# Patient Record
Sex: Female | Born: 1960 | Race: Black or African American | Hispanic: No | Smoking: Current every day smoker
Health system: Southern US, Community
[De-identification: ages and names within clinical notes are randomized; demographics above are authoritative.]

## PROBLEM LIST (undated history)

## (undated) ENCOUNTER — Emergency Department: Admission: EM | Source: Home / Self Care

## (undated) DIAGNOSIS — M1990 Unspecified osteoarthritis, unspecified site: Secondary | ICD-10-CM

## (undated) DIAGNOSIS — N61 Mastitis without abscess: Secondary | ICD-10-CM

## (undated) DIAGNOSIS — I10 Essential (primary) hypertension: Secondary | ICD-10-CM

## (undated) DIAGNOSIS — Z800 Family history of malignant neoplasm of digestive organs: Secondary | ICD-10-CM

## (undated) DIAGNOSIS — K621 Rectal polyp: Secondary | ICD-10-CM

## (undated) DIAGNOSIS — I2510 Atherosclerotic heart disease of native coronary artery without angina pectoris: Secondary | ICD-10-CM

## (undated) DIAGNOSIS — R140 Abdominal distension (gaseous): Secondary | ICD-10-CM

## (undated) DIAGNOSIS — K635 Polyp of colon: Secondary | ICD-10-CM

## (undated) DIAGNOSIS — C50919 Malignant neoplasm of unspecified site of unspecified female breast: Secondary | ICD-10-CM

## (undated) DIAGNOSIS — R0609 Other forms of dyspnea: Secondary | ICD-10-CM

## (undated) DIAGNOSIS — L732 Hidradenitis suppurativa: Secondary | ICD-10-CM

## (undated) DIAGNOSIS — J449 Chronic obstructive pulmonary disease, unspecified: Secondary | ICD-10-CM

## (undated) DIAGNOSIS — R7989 Other specified abnormal findings of blood chemistry: Secondary | ICD-10-CM

## (undated) DIAGNOSIS — Z923 Personal history of irradiation: Secondary | ICD-10-CM

## (undated) DIAGNOSIS — R0600 Dyspnea, unspecified: Secondary | ICD-10-CM

## (undated) DIAGNOSIS — Z9221 Personal history of antineoplastic chemotherapy: Secondary | ICD-10-CM

## (undated) DIAGNOSIS — M109 Gout, unspecified: Secondary | ICD-10-CM

## (undated) DIAGNOSIS — IMO0001 Reserved for inherently not codable concepts without codable children: Secondary | ICD-10-CM

## (undated) DIAGNOSIS — R5383 Other fatigue: Secondary | ICD-10-CM

## (undated) DIAGNOSIS — Z8051 Family history of malignant neoplasm of kidney: Secondary | ICD-10-CM

## (undated) DIAGNOSIS — R945 Abnormal results of liver function studies: Secondary | ICD-10-CM

## (undated) DIAGNOSIS — E119 Type 2 diabetes mellitus without complications: Secondary | ICD-10-CM

## (undated) DIAGNOSIS — I700 Atherosclerosis of aorta: Secondary | ICD-10-CM

## (undated) DIAGNOSIS — Z803 Family history of malignant neoplasm of breast: Secondary | ICD-10-CM

## (undated) DIAGNOSIS — I739 Peripheral vascular disease, unspecified: Secondary | ICD-10-CM

## (undated) DIAGNOSIS — E785 Hyperlipidemia, unspecified: Secondary | ICD-10-CM

## (undated) DIAGNOSIS — S21002S Unspecified open wound of left breast, sequela: Secondary | ICD-10-CM

## (undated) DIAGNOSIS — K219 Gastro-esophageal reflux disease without esophagitis: Secondary | ICD-10-CM

## (undated) DIAGNOSIS — R7611 Nonspecific reaction to tuberculin skin test without active tuberculosis: Secondary | ICD-10-CM

## (undated) HISTORY — DX: Polyp of colon: K63.5

## (undated) HISTORY — DX: Nonspecific reaction to tuberculin skin test without active tuberculosis: R76.11

## (undated) HISTORY — DX: Hidradenitis suppurativa: L73.2

## (undated) HISTORY — DX: Rectal polyp: K62.1

## (undated) HISTORY — DX: Unspecified osteoarthritis, unspecified site: M19.90

## (undated) HISTORY — PX: TUBAL LIGATION: SHX77

## (undated) HISTORY — DX: Family history of malignant neoplasm of kidney: Z80.51

## (undated) HISTORY — DX: Other fatigue: R53.83

## (undated) HISTORY — DX: Other specified abnormal findings of blood chemistry: R79.89

## (undated) HISTORY — DX: Abdominal distension (gaseous): R14.0

## (undated) HISTORY — DX: Family history of malignant neoplasm of breast: Z80.3

## (undated) HISTORY — DX: Family history of malignant neoplasm of digestive organs: Z80.0

## (undated) HISTORY — DX: Mastitis without abscess: N61.0

## (undated) HISTORY — DX: Malignant neoplasm of unspecified site of unspecified female breast: C50.919

## (undated) HISTORY — DX: Unspecified open wound of left breast, sequela: S21.002S

## (undated) HISTORY — DX: Abnormal results of liver function studies: R94.5

## (undated) HISTORY — DX: Hyperlipidemia, unspecified: E78.5

## (undated) HISTORY — DX: Gout, unspecified: M10.9

## (undated) HISTORY — DX: Essential (primary) hypertension: I10

---

## 2005-08-09 ENCOUNTER — Emergency Department: Payer: Self-pay | Admitting: Emergency Medicine

## 2010-04-27 ENCOUNTER — Ambulatory Visit: Payer: Self-pay

## 2011-12-07 ENCOUNTER — Emergency Department: Payer: Self-pay | Admitting: *Deleted

## 2011-12-07 LAB — URINALYSIS, COMPLETE
Bilirubin,UR: NEGATIVE
Glucose,UR: NEGATIVE mg/dL (ref 0–75)
Ketone: NEGATIVE
Nitrite: NEGATIVE
Ph: 5 (ref 4.5–8.0)
Protein: NEGATIVE
RBC,UR: 7 /HPF (ref 0–5)
Specific Gravity: 1.023 (ref 1.003–1.030)
Squamous Epithelial: 7
WBC UR: 12 /HPF (ref 0–5)

## 2011-12-07 LAB — PREGNANCY, URINE: Pregnancy Test, Urine: NEGATIVE m[IU]/mL

## 2012-05-15 ENCOUNTER — Ambulatory Visit: Payer: Self-pay

## 2012-05-30 HISTORY — PX: ESOPHAGOGASTRODUODENOSCOPY: SHX1529

## 2012-09-13 ENCOUNTER — Ambulatory Visit: Payer: Self-pay

## 2012-09-18 ENCOUNTER — Ambulatory Visit: Payer: Self-pay

## 2012-09-18 LAB — CREATININE, SERUM
Creatinine: 0.94 mg/dL (ref 0.60–1.30)
EGFR (African American): >60
EGFR (Non-African Amer.): >60

## 2012-11-16 DIAGNOSIS — IMO0002 Reserved for concepts with insufficient information to code with codable children: Secondary | ICD-10-CM | POA: Insufficient documentation

## 2012-11-16 DIAGNOSIS — Q661 Congenital talipes calcaneovarus, unspecified foot: Secondary | ICD-10-CM | POA: Insufficient documentation

## 2012-11-16 DIAGNOSIS — E119 Type 2 diabetes mellitus without complications: Secondary | ICD-10-CM | POA: Insufficient documentation

## 2013-08-22 ENCOUNTER — Ambulatory Visit: Payer: Self-pay | Admitting: Podiatrist

## 2013-09-06 ENCOUNTER — Ambulatory Visit: Payer: Self-pay | Admitting: Podiatry

## 2013-09-07 HISTORY — PX: CHOLECYSTECTOMY: SHX55

## 2014-01-09 ENCOUNTER — Emergency Department: Payer: Self-pay | Admitting: Emergency Medicine

## 2014-01-13 DIAGNOSIS — S42253A Displaced fracture of greater tuberosity of unspecified humerus, initial encounter for closed fracture: Secondary | ICD-10-CM | POA: Insufficient documentation

## 2014-05-30 DIAGNOSIS — Z923 Personal history of irradiation: Secondary | ICD-10-CM

## 2014-05-30 DIAGNOSIS — C50912 Malignant neoplasm of unspecified site of left female breast: Secondary | ICD-10-CM | POA: Insufficient documentation

## 2014-05-30 DIAGNOSIS — C50919 Malignant neoplasm of unspecified site of unspecified female breast: Secondary | ICD-10-CM

## 2014-05-30 DIAGNOSIS — Z9221 Personal history of antineoplastic chemotherapy: Secondary | ICD-10-CM

## 2014-05-30 DIAGNOSIS — Z853 Personal history of malignant neoplasm of breast: Secondary | ICD-10-CM

## 2014-05-30 HISTORY — DX: Personal history of irradiation: Z92.3

## 2014-05-30 HISTORY — DX: Personal history of antineoplastic chemotherapy: Z92.21

## 2014-05-30 HISTORY — DX: Malignant neoplasm of unspecified site of unspecified female breast: C50.919

## 2014-05-30 HISTORY — PX: BREAST LUMPECTOMY: SHX2

## 2014-06-02 DIAGNOSIS — R7309 Other abnormal glucose: Secondary | ICD-10-CM | POA: Diagnosis not present

## 2014-06-02 DIAGNOSIS — K439 Ventral hernia without obstruction or gangrene: Secondary | ICD-10-CM | POA: Diagnosis not present

## 2014-06-02 DIAGNOSIS — Z9289 Personal history of other medical treatment: Secondary | ICD-10-CM | POA: Diagnosis not present

## 2014-06-02 DIAGNOSIS — I10 Essential (primary) hypertension: Secondary | ICD-10-CM | POA: Diagnosis not present

## 2014-06-30 DIAGNOSIS — E119 Type 2 diabetes mellitus without complications: Secondary | ICD-10-CM | POA: Insufficient documentation

## 2014-06-30 HISTORY — DX: Type 2 diabetes mellitus without complications: E11.9

## 2014-07-28 ENCOUNTER — Ambulatory Visit: Payer: Self-pay | Admitting: Internal Medicine

## 2014-07-28 DIAGNOSIS — Z1231 Encounter for screening mammogram for malignant neoplasm of breast: Secondary | ICD-10-CM | POA: Diagnosis not present

## 2014-07-29 HISTORY — PX: BREAST LUMPECTOMY: SHX2

## 2014-07-30 ENCOUNTER — Ambulatory Visit: Payer: Self-pay | Admitting: Internal Medicine

## 2014-07-30 DIAGNOSIS — R928 Other abnormal and inconclusive findings on diagnostic imaging of breast: Secondary | ICD-10-CM | POA: Diagnosis not present

## 2014-07-30 DIAGNOSIS — N63 Unspecified lump in breast: Secondary | ICD-10-CM | POA: Diagnosis not present

## 2014-07-31 HISTORY — PX: BREAST BIOPSY: SHX20

## 2014-08-04 ENCOUNTER — Ambulatory Visit: Payer: Self-pay | Admitting: Internal Medicine

## 2014-08-04 DIAGNOSIS — C50812 Malignant neoplasm of overlapping sites of left female breast: Secondary | ICD-10-CM | POA: Diagnosis not present

## 2014-08-04 DIAGNOSIS — N63 Unspecified lump in breast: Secondary | ICD-10-CM | POA: Diagnosis not present

## 2014-08-04 DIAGNOSIS — D0512 Intraductal carcinoma in situ of left breast: Secondary | ICD-10-CM | POA: Diagnosis not present

## 2014-08-04 DIAGNOSIS — C50912 Malignant neoplasm of unspecified site of left female breast: Secondary | ICD-10-CM

## 2014-08-04 HISTORY — DX: Malignant neoplasm of unspecified site of left female breast: C50.912

## 2014-08-11 DIAGNOSIS — C50212 Malignant neoplasm of upper-inner quadrant of left female breast: Secondary | ICD-10-CM | POA: Diagnosis not present

## 2014-08-19 ENCOUNTER — Ambulatory Visit: Payer: Self-pay | Admitting: Surgery

## 2014-08-19 DIAGNOSIS — Z01810 Encounter for preprocedural cardiovascular examination: Secondary | ICD-10-CM | POA: Diagnosis not present

## 2014-08-19 DIAGNOSIS — Z803 Family history of malignant neoplasm of breast: Secondary | ICD-10-CM | POA: Diagnosis not present

## 2014-08-19 DIAGNOSIS — Z01812 Encounter for preprocedural laboratory examination: Secondary | ICD-10-CM | POA: Diagnosis not present

## 2014-08-19 DIAGNOSIS — F17200 Nicotine dependence, unspecified, uncomplicated: Secondary | ICD-10-CM | POA: Diagnosis not present

## 2014-08-19 DIAGNOSIS — R109 Unspecified abdominal pain: Secondary | ICD-10-CM | POA: Diagnosis not present

## 2014-08-19 DIAGNOSIS — R7309 Other abnormal glucose: Secondary | ICD-10-CM | POA: Diagnosis not present

## 2014-08-19 DIAGNOSIS — N63 Unspecified lump in breast: Secondary | ICD-10-CM | POA: Diagnosis not present

## 2014-08-19 DIAGNOSIS — I10 Essential (primary) hypertension: Secondary | ICD-10-CM | POA: Diagnosis not present

## 2014-08-19 DIAGNOSIS — R590 Localized enlarged lymph nodes: Secondary | ICD-10-CM | POA: Diagnosis not present

## 2014-08-27 ENCOUNTER — Ambulatory Visit
Admit: 2014-08-27 | Disposition: A | Discharge status: Home / Self Care | Payer: Self-pay | Attending: Surgery | Admitting: Surgery

## 2014-08-27 DIAGNOSIS — Z8541 Personal history of malignant neoplasm of cervix uteri: Secondary | ICD-10-CM | POA: Diagnosis not present

## 2014-08-27 DIAGNOSIS — I10 Essential (primary) hypertension: Secondary | ICD-10-CM | POA: Diagnosis not present

## 2014-08-27 DIAGNOSIS — Z79899 Other long term (current) drug therapy: Secondary | ICD-10-CM | POA: Diagnosis not present

## 2014-08-27 DIAGNOSIS — Z9889 Other specified postprocedural states: Secondary | ICD-10-CM | POA: Diagnosis not present

## 2014-08-27 DIAGNOSIS — Z887 Allergy status to serum and vaccine status: Secondary | ICD-10-CM | POA: Diagnosis not present

## 2014-08-27 DIAGNOSIS — C50912 Malignant neoplasm of unspecified site of left female breast: Secondary | ICD-10-CM | POA: Diagnosis not present

## 2014-08-27 DIAGNOSIS — C50919 Malignant neoplasm of unspecified site of unspecified female breast: Secondary | ICD-10-CM | POA: Diagnosis not present

## 2014-08-27 DIAGNOSIS — Z170 Estrogen receptor positive status [ER+]: Secondary | ICD-10-CM | POA: Diagnosis not present

## 2014-08-27 DIAGNOSIS — M1990 Unspecified osteoarthritis, unspecified site: Secondary | ICD-10-CM | POA: Diagnosis not present

## 2014-08-27 DIAGNOSIS — E118 Type 2 diabetes mellitus with unspecified complications: Secondary | ICD-10-CM | POA: Diagnosis not present

## 2014-08-27 HISTORY — PX: BREAST LUMPECTOMY WITH NEEDLE LOCALIZATION AND AXILLARY SENTINEL LYMPH NODE BX: SHX5760

## 2014-09-09 ENCOUNTER — Ambulatory Visit
Admit: 2014-09-09 | Disposition: A | Discharge status: Home / Self Care | Payer: Self-pay | Attending: Hematology and Oncology | Admitting: Hematology and Oncology

## 2014-09-09 DIAGNOSIS — Z79899 Other long term (current) drug therapy: Secondary | ICD-10-CM | POA: Diagnosis not present

## 2014-09-09 DIAGNOSIS — M545 Low back pain: Secondary | ICD-10-CM | POA: Diagnosis not present

## 2014-09-09 DIAGNOSIS — Z170 Estrogen receptor positive status [ER+]: Secondary | ICD-10-CM | POA: Diagnosis not present

## 2014-09-09 DIAGNOSIS — I10 Essential (primary) hypertension: Secondary | ICD-10-CM | POA: Diagnosis not present

## 2014-09-09 DIAGNOSIS — Z720 Tobacco use: Secondary | ICD-10-CM | POA: Diagnosis not present

## 2014-09-09 DIAGNOSIS — C50212 Malignant neoplasm of upper-inner quadrant of left female breast: Secondary | ICD-10-CM | POA: Diagnosis not present

## 2014-09-09 DIAGNOSIS — G8929 Other chronic pain: Secondary | ICD-10-CM | POA: Diagnosis not present

## 2014-09-09 DIAGNOSIS — R05 Cough: Secondary | ICD-10-CM | POA: Diagnosis not present

## 2014-09-09 DIAGNOSIS — C773 Secondary and unspecified malignant neoplasm of axilla and upper limb lymph nodes: Secondary | ICD-10-CM | POA: Diagnosis not present

## 2014-09-09 LAB — COMPREHENSIVE METABOLIC PANEL
Albumin: 4.2 g/dL
Alkaline Phosphatase: 96 U/L
Anion Gap: 8 (ref 7–16)
BUN: 11 mg/dL
Bilirubin,Total: 0.6 mg/dL
Calcium, Total: 9.1 mg/dL
Chloride: 105 mmol/L
Co2: 24 mmol/L
Creatinine: 0.67 mg/dL
EGFR (African American): >60
EGFR (Non-African Amer.): >60
Glucose: 113 mg/dL — ABNORMAL HIGH
Potassium: 4.3 mmol/L
SGOT(AST): 24 U/L
SGPT (ALT): 41 U/L
Sodium: 137 mmol/L
Total Protein: 7.9 g/dL

## 2014-09-09 LAB — CBC CANCER CENTER
Basophil #: 0 x10 3/mm (ref 0.0–0.1)
Basophil %: 0.3 %
Eosinophil #: 0.2 x10 3/mm (ref 0.0–0.7)
Eosinophil %: 2.9 %
HCT: 44.5 % (ref 35.0–47.0)
HGB: 14.5 g/dL (ref 12.0–16.0)
Lymphocyte #: 2.7 x10 3/mm (ref 1.0–3.6)
Lymphocyte %: 34.9 %
MCH: 27.1 pg (ref 26.0–34.0)
MCHC: 32.6 g/dL (ref 32.0–36.0)
MCV: 83 fL (ref 80–100)
Monocyte #: 0.4 x10 3/mm (ref 0.2–0.9)
Monocyte %: 5.2 %
Neutrophil #: 4.4 x10 3/mm (ref 1.4–6.5)
Neutrophil %: 56.7 %
Platelet: 227 x10 3/mm (ref 150–440)
RBC: 5.37 10*6/uL — ABNORMAL HIGH (ref 3.80–5.20)
RDW: 13.7 % (ref 11.5–14.5)
WBC: 7.7 x10 3/mm (ref 3.6–11.0)

## 2014-09-09 LAB — HEPATIC FUNCTION PANEL A (ARMC)
Albumin: 4.1 g/dL
Alkaline Phosphatase: 79 U/L
Bilirubin, Direct: <0.1 mg/dL
Bilirubin,Total: 0.1 mg/dL — ABNORMAL LOW
SGOT(AST): 26 U/L
SGPT (ALT): 22 U/L
Total Protein: 7.4 g/dL

## 2014-09-10 LAB — CANCER ANTIGEN 27.29: CA 27.29: 16.9 U/mL (ref 0.0–38.6)

## 2014-09-15 DIAGNOSIS — Z720 Tobacco use: Secondary | ICD-10-CM | POA: Diagnosis not present

## 2014-09-15 DIAGNOSIS — M8589 Other specified disorders of bone density and structure, multiple sites: Secondary | ICD-10-CM | POA: Diagnosis not present

## 2014-09-15 DIAGNOSIS — M545 Low back pain: Secondary | ICD-10-CM | POA: Diagnosis not present

## 2014-09-15 DIAGNOSIS — C50212 Malignant neoplasm of upper-inner quadrant of left female breast: Secondary | ICD-10-CM | POA: Diagnosis not present

## 2014-09-15 DIAGNOSIS — R05 Cough: Secondary | ICD-10-CM | POA: Diagnosis not present

## 2014-09-15 DIAGNOSIS — G8929 Other chronic pain: Secondary | ICD-10-CM | POA: Diagnosis not present

## 2014-09-15 DIAGNOSIS — I10 Essential (primary) hypertension: Secondary | ICD-10-CM | POA: Diagnosis not present

## 2014-09-15 DIAGNOSIS — Z79899 Other long term (current) drug therapy: Secondary | ICD-10-CM | POA: Diagnosis not present

## 2014-09-15 DIAGNOSIS — C773 Secondary and unspecified malignant neoplasm of axilla and upper limb lymph nodes: Secondary | ICD-10-CM | POA: Diagnosis not present

## 2014-09-15 DIAGNOSIS — Z780 Asymptomatic menopausal state: Secondary | ICD-10-CM | POA: Diagnosis not present

## 2014-09-15 DIAGNOSIS — Z170 Estrogen receptor positive status [ER+]: Secondary | ICD-10-CM | POA: Diagnosis not present

## 2014-09-16 DIAGNOSIS — G8929 Other chronic pain: Secondary | ICD-10-CM | POA: Diagnosis not present

## 2014-09-16 DIAGNOSIS — I10 Essential (primary) hypertension: Secondary | ICD-10-CM | POA: Diagnosis not present

## 2014-09-16 DIAGNOSIS — C50212 Malignant neoplasm of upper-inner quadrant of left female breast: Secondary | ICD-10-CM | POA: Diagnosis not present

## 2014-09-16 DIAGNOSIS — M25552 Pain in left hip: Secondary | ICD-10-CM | POA: Diagnosis not present

## 2014-09-16 DIAGNOSIS — R05 Cough: Secondary | ICD-10-CM | POA: Diagnosis not present

## 2014-09-16 DIAGNOSIS — M25512 Pain in left shoulder: Secondary | ICD-10-CM | POA: Diagnosis not present

## 2014-09-16 DIAGNOSIS — Z170 Estrogen receptor positive status [ER+]: Secondary | ICD-10-CM | POA: Diagnosis not present

## 2014-09-16 DIAGNOSIS — Z79899 Other long term (current) drug therapy: Secondary | ICD-10-CM | POA: Diagnosis not present

## 2014-09-16 DIAGNOSIS — Z853 Personal history of malignant neoplasm of breast: Secondary | ICD-10-CM | POA: Diagnosis not present

## 2014-09-16 DIAGNOSIS — M545 Low back pain: Secondary | ICD-10-CM | POA: Diagnosis not present

## 2014-09-16 DIAGNOSIS — C773 Secondary and unspecified malignant neoplasm of axilla and upper limb lymph nodes: Secondary | ICD-10-CM | POA: Diagnosis not present

## 2014-09-16 DIAGNOSIS — Z720 Tobacco use: Secondary | ICD-10-CM | POA: Diagnosis not present

## 2014-09-17 DIAGNOSIS — C50212 Malignant neoplasm of upper-inner quadrant of left female breast: Secondary | ICD-10-CM | POA: Diagnosis not present

## 2014-09-18 DIAGNOSIS — Z170 Estrogen receptor positive status [ER+]: Secondary | ICD-10-CM | POA: Diagnosis not present

## 2014-09-18 DIAGNOSIS — C50212 Malignant neoplasm of upper-inner quadrant of left female breast: Secondary | ICD-10-CM | POA: Diagnosis not present

## 2014-09-18 DIAGNOSIS — C773 Secondary and unspecified malignant neoplasm of axilla and upper limb lymph nodes: Secondary | ICD-10-CM | POA: Diagnosis not present

## 2014-09-22 LAB — SURGICAL PATHOLOGY

## 2014-09-24 ENCOUNTER — Other Ambulatory Visit: Payer: Self-pay | Admitting: Hematology and Oncology

## 2014-09-24 DIAGNOSIS — M545 Low back pain: Secondary | ICD-10-CM | POA: Diagnosis not present

## 2014-09-24 DIAGNOSIS — R05 Cough: Secondary | ICD-10-CM | POA: Diagnosis not present

## 2014-09-24 DIAGNOSIS — Z170 Estrogen receptor positive status [ER+]: Secondary | ICD-10-CM | POA: Diagnosis not present

## 2014-09-24 DIAGNOSIS — C773 Secondary and unspecified malignant neoplasm of axilla and upper limb lymph nodes: Secondary | ICD-10-CM | POA: Diagnosis not present

## 2014-09-24 DIAGNOSIS — R160 Hepatomegaly, not elsewhere classified: Secondary | ICD-10-CM | POA: Diagnosis not present

## 2014-09-24 DIAGNOSIS — C50212 Malignant neoplasm of upper-inner quadrant of left female breast: Secondary | ICD-10-CM | POA: Diagnosis not present

## 2014-09-24 DIAGNOSIS — Z720 Tobacco use: Secondary | ICD-10-CM | POA: Diagnosis not present

## 2014-09-24 DIAGNOSIS — I10 Essential (primary) hypertension: Secondary | ICD-10-CM | POA: Diagnosis not present

## 2014-09-24 DIAGNOSIS — G8929 Other chronic pain: Secondary | ICD-10-CM | POA: Diagnosis not present

## 2014-09-24 DIAGNOSIS — Z79899 Other long term (current) drug therapy: Secondary | ICD-10-CM | POA: Diagnosis not present

## 2014-09-24 LAB — CREATININE, SERUM: Creatine, Serum: 0.67

## 2014-09-27 DIAGNOSIS — Z79899 Other long term (current) drug therapy: Secondary | ICD-10-CM | POA: Diagnosis not present

## 2014-09-27 DIAGNOSIS — I10 Essential (primary) hypertension: Secondary | ICD-10-CM | POA: Diagnosis not present

## 2014-09-27 DIAGNOSIS — Z170 Estrogen receptor positive status [ER+]: Secondary | ICD-10-CM | POA: Diagnosis not present

## 2014-09-27 DIAGNOSIS — Z720 Tobacco use: Secondary | ICD-10-CM | POA: Diagnosis not present

## 2014-09-27 DIAGNOSIS — M47816 Spondylosis without myelopathy or radiculopathy, lumbar region: Secondary | ICD-10-CM | POA: Diagnosis not present

## 2014-09-27 DIAGNOSIS — C50919 Malignant neoplasm of unspecified site of unspecified female breast: Secondary | ICD-10-CM | POA: Diagnosis not present

## 2014-09-27 DIAGNOSIS — C773 Secondary and unspecified malignant neoplasm of axilla and upper limb lymph nodes: Secondary | ICD-10-CM | POA: Diagnosis not present

## 2014-09-27 DIAGNOSIS — G8929 Other chronic pain: Secondary | ICD-10-CM | POA: Diagnosis not present

## 2014-09-27 DIAGNOSIS — R05 Cough: Secondary | ICD-10-CM | POA: Diagnosis not present

## 2014-09-27 DIAGNOSIS — C50212 Malignant neoplasm of upper-inner quadrant of left female breast: Secondary | ICD-10-CM | POA: Diagnosis not present

## 2014-09-27 DIAGNOSIS — M545 Low back pain: Secondary | ICD-10-CM | POA: Diagnosis not present

## 2014-09-28 NOTE — Consult Note (Signed)
Reason for Visit: This 54 year old Female patient presents to the clinic for initial evaluation of  Breast cancer .   Referred by Dr. Mike Gip.  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 Tumor is ER/PR positive HER-2/neu not overexpressed  Pathology Report Pathology report reviewed   Imaging Report Mammograms and ultrasound reviewed   Referral Report Clinical notes reviewed   Planned Treatment Regimen Whole breast radiation plus systemic chemotherapy plus aromatase inhibitor   HPI   Patient's a pleasant 54 year old female who presented with an abnormal mammogram of her left breast showing a 1.5 cm irregular density upper inner quadrant of the left breast. No axillary adenopathy was noted. Underwent ultrasound guided biopsy showing ER/PR positive strongly HER-2/neu negative invasive mammary carcinoma. Underwent wide local excision and sentinel node biopsy on 08/27/2014. Tumor was 0.8 cm overall grade 2 invasive ductal carcinoma with lymphovascular invasion margins were clear. One of 2 sentinel lymph nodes was positive for a 2.8 mm focus of metastatic disease stage NI a. She has done well postoperatively. She's had a bone scan showing abnormal focal radio isotope uptake in L3 pedicle and L5 vertebral body workup that is in progress. She scheduled commenced with systemic chemotherapy under medical oncology's direction she seen today for opinion regarding radiation therapy.  Past Hx:    Right foot turned out:    Prediabetes:    Elevated liver function tests:    + PPD (treated):    Breast cancer: 04-Aug-2014   Hypertension:    Arthritis:    Lumpectomy and sentinel lymph node biopsy: 27-Aug-2014   Breast Biopsy:    Cholecystectomy:    Tubal Ligation:   Past, Family and Social History:  Past Medical History positive    Cardiovascular hypertension   Gastrointestinal Elevated liver liver function tests   Endocrine diabetes mellitus   Past Surgical History cholecystectomy; Tubal ligation   Past Medical History Comments Arthritis   Family History positive   Family History Comments Mother with pancreatic cancer maternal grandmother with breast cancer father with throat cancer   Social History positive   Social History Comments 40-pack-year smoking history smoking cessation counseling has been provided also social EtOH use history   Additional Past Medical and Surgical History Seen by herself today   Allergies:   No Known Allergies:   Home Meds:  Home Medications: Medication Instructions Status  acetaminophen-HYDROcodone 325 mg-5 mg oral tablet 1-2 tab(s) orally every 4 hours, As needed, moderate pain (4-6/10) Active  promethazine 12.5 mg oral tablet 1 tab(s) orally every 3 hours, As needed, nausea, vomiting Active  lisinopril 20 mg oral tablet 1 tab(s) orally once a day Active   Review of Systems:  General negative   Performance Status (ECOG) 0   Skin negative   Breast see HPI   Ophthalmologic negative   ENMT negative   Respiratory and Thorax negative   Cardiovascular negative   Gastrointestinal negative   Genitourinary negative   Musculoskeletal negative   Neurological negative   Psychiatric negative   Hematology/Lymphatics negative   Endocrine negative   Allergic/Immunologic negative   Review of Systems   denies any weight loss, fatigue, weakness, fever, chills or night sweats. Patient denies any loss of vision, blurred vision. Patient denies any ringing  of the ears or hearing loss. No irregular heartbeat. Patient denies heart murmur or history of fainting. Patient  denies any chest pain or pain radiating to her upper extremities. Patient denies any shortness of breath, difficulty breathing at night, cough or hemoptysis. Patient denies any swelling in the lower  legs. Patient denies any nausea vomiting, vomiting of blood, or coffee ground material in the vomitus. Patient denies any stomach pain. Patient states has had normal bowel movements no significant constipation or diarrhea. Patient denies any dysuria, hematuria or significant nocturia. Patient denies any problems walking, swelling in the joints or loss of balance. Patient denies any skin changes, loss of hair or loss of weight. Patient denies any excessive worrying or anxiety or significant depression. Patient denies any problems with insomnia. Patient denies excessive thirst, polyuria, polydipsia. Patient denies any swollen glands, patient denies easy bruising or easy bleeding. Patient denies any recent infections, allergies or URI. Patient "s visual fields have not changed significantly in recent time.   Nursing Notes:  Nursing Vital Signs and Chemo Nursing Nursing Notes: *CC Vital Signs Flowsheet:   20-Apr-16 13:26  Temp Temperature 98.3  Pulse Pulse 80  Respirations Respirations 20  SBP SBP 168  DBP DBP 94  Pain Scale (0-10)  0  Current Weight (kg) (kg) 97.4  Height (cm) centimeters 174  BSA (m2) 2.1   Physical Exam:  General/Skin/HEENT:  General normal   Skin normal   Eyes normal   ENMT normal   Head and Neck normal   Additional PE A well-developed slightly obese female in NAD. Lungs are clear to A&P cardiac examination shows regular rate and rhythm. She status post wide local excision of left breast which is healing well. No dominant mass or nodularity is noted in either breast in 2 positions examined. No axillary or supraclavicular adenopathy is appreciated.   Breasts/Resp/CV/GI/GU:  Respiratory and Thorax normal   Cardiovascular normal   Gastrointestinal normal   Genitourinary normal   MS/Neuro/Psych/Lymph:  Musculoskeletal normal   Neurological normal   Lymphatics normal   Other Results:  Radiology Results: Nuclear Med:    19-Apr-16 14:20, Bone Scan Whole  Body (Part 2 of 2)  Bone Scan Whole Body (Part 2 of 2)   REASON FOR EXAM:    breast CA      pain in lt clavicle rt hip SI joint  COMMENTS:       PROCEDURE: NM  - NM BONE WB 3 HR 2 OF 2  - Sep 16 2014  2:20PM     CLINICAL DATA:  History of breast cancer, left mastectomy March  2016, left clavicular pain, right hip pain, sacroiliac joint pain    EXAM:  NUCLEAR MEDICINE WHOLE BODY BONE SCAN    TECHNIQUE:  Whole body anterior and posterior images were obtained approximately  3 hours after intravenous injection of radiopharmaceutical.  RADIOPHARMACEUTICALS: 23.7 mCi Technetium-99 MDP    COMPARISON:  No similar comparison imaging is available since prior  CT abdomen/ pelvis 09/13/2012    FINDINGS:  Mild soft tissue uptake is noted over the expected location of the  left mastectomy bed, likely postoperative. Mild radiotracer uptake  is identified at the shoulders and ankles bilaterally which may be  degenerative. Probable degenerative sternoclavicular joint uptake is  noted. Physiologic renal uptake is identified. There is focal uptake  in the regionof the right approximate L3 level pedicle and over the  approximate L5 vertebral body level.     IMPRESSION:  Abnormal focal radiotracer uptake at the level of the right L3  pedicle and L5 vertebral body regions. Further evaluation at  dedicated  lumbar spine radiographs is recommended. This could be  degenerative but metastatic disease could appear similar.      Electronically Signed    By: Conchita Paris M.D.    On: 09/16/2014 17:04         Verified By: Arline Asp, M.D.,   Relevent Results:   Relevant Scans and Labs Bone scan reviewed, mammograms reviewed   Assessment and Plan: Impression:   Stage IIa invasive mammary carcinoma of the left breast as was wide local excision and sentinel node biopsy ER/PR positive in 54 year old female for systemic chemotherapy followed by whole breast radiation and aromatase  inhibitor. Plan:   At this time as per my preliminary gone over risks and benefits of radiation therapy to her left breast. Depending on results of workup of her bone scan patient will be going ahead with systemic chemotherapy. Will follow-up after completion of chemotherapy for further discussion regarding whole breast radiation. Risks and benefits of treatment including skin reaction and fatigue inclusion of superficial lung alteration of blood counts thickening of the breast all were discussed in detail with the patient. Will discuss the case personally with medical oncology. Patient will also be candidate for aromatase inhibitor therapy after completion of radiation.  I would like to take this opportunity for allowing me to participate in the care of your patient..    Electronic Signatures: Zarina Pe, Roda Shutters (MD)  (Signed 20-Apr-16 15:12)  Authored: HPI, Diagnosis, Past Hx, PFSH, Allergies, Home Meds, ROS, Nursing Notes, Physical Exam, Other Results, Relevent Results, Encounter Assessment and Plan, Fax to Physician   Last Updated: 20-Apr-16 15:12 by Armstead Peaks (MD)

## 2014-09-28 NOTE — Op Note (Signed)
PATIENT NAME:  Donna Riley, Donna Riley MR#:  [id number] DATE OF BIRTH:  Feb 05, 1961  DATE OF PROCEDURE:  08/27/2014  OPERATION PERFORMED:  1.  Wire localized left medial partial mastectomy.  2.  Left axillary sentinel lymph node biopsy.   SURGEON: Consuela Mimes, MD.   ANESTHESIA: General.   PREOPERATIVE DIAGNOSIS: Left medial breast cancer.   POSTOPERATIVE DIAGNOSIS: Left medial breast cancer.   PROCEDURE IN DETAIL: The patient was injected with radionuclide a number of hours prior to beginning of the procedure. The wire localization films were reviewed. The patient's subareolar area and area just lateral to the medial breast lesion was injected with approximately 8 mL of methylene blue dye and the breast was massaged for 5 minutes. She was then prepped and draped in the usual sterile fashion. The gamma probe was used and at the primary injection site the counts averaged 17,000. There was an area of skin over the axillary sentinel lymph node that averaged 300. An incision was made in the lines of the skin over this spot and 2 blue sentinel lymph nodes were identified and excised separately. The first had an ex vivo count of 460 and the second had an ex vivo count of 1200. After excising both of these the background counts were a maximum of 50 in the axillary wound. Hemostasis was achieved with a combination of medium hemoclips and the electrocautery and medium hemoclips were placed on all afferent and efferent lymphatics that could be identified. Both of these sentinel lymph nodes returned on frozen section as negative for cancer. Therefore the subdermal portion of the incision was closed with interrupted 3-0 Monocryl and the skin was closed with a running subcuticular 4-0 Monocryl and suture strips. While I was waiting for the pathology I began the lumpectomy. This was performed through a transverse incision as the wire and the mass were both directly medial to the nipple. Once the skin and subcutaneous  tissue were opened the wire was brought into the wound and a generous partial mastectomy was undertaken to include the entire wire, clip, mass, and a small amount of pectoralis major muscle fascia. The specimen was oriented for the pathologist, placed in a Dubin container and x-rayed and indeed contained the tip of the wire, the clip, and the radiographic mass. Hemostasis here was also achieved with a combination of the electrocautery and medium hemoclips and medium hemoclips were placed at the periphery of the lumpectomy or partial mastectomy site for future reference for radiation therapy. This incision was closed identically to the axillary incision and then fluffs were placed on the breast and the entire chest was wrapped with Kerlix gauze and two 6-inch Ace wraps, completing the procedure. The patient tolerated the procedure well and there were no complications.    ____________________________ Consuela Mimes, MD wfm:[id number] D: 08/27/2014 14:41:26 ET T: 08/27/2014 19:55:53 ET JOB#: [id number]  cc: Consuela Mimes, MD, <Dictator> Consuela Mimes MD ELECTRONICALLY SIGNED 08/28/2014 19:01

## 2014-09-29 ENCOUNTER — Inpatient Hospital Stay: Payer: Commercial Managed Care - HMO | Attending: Hematology and Oncology

## 2014-09-29 ENCOUNTER — Inpatient Hospital Stay (HOSPITAL_BASED_OUTPATIENT_CLINIC_OR_DEPARTMENT_OTHER): Payer: Commercial Managed Care - HMO | Admitting: Hematology and Oncology

## 2014-09-29 VITALS — BP 162/89 | HR 80 | Resp 20 | Ht 68.0 in | Wt 219.4 lb

## 2014-09-29 DIAGNOSIS — C50912 Malignant neoplasm of unspecified site of left female breast: Secondary | ICD-10-CM

## 2014-09-29 DIAGNOSIS — R7989 Other specified abnormal findings of blood chemistry: Secondary | ICD-10-CM | POA: Diagnosis not present

## 2014-09-29 DIAGNOSIS — R160 Hepatomegaly, not elsewhere classified: Secondary | ICD-10-CM | POA: Diagnosis not present

## 2014-09-29 DIAGNOSIS — M898X9 Other specified disorders of bone, unspecified site: Secondary | ICD-10-CM | POA: Insufficient documentation

## 2014-09-29 DIAGNOSIS — I10 Essential (primary) hypertension: Secondary | ICD-10-CM | POA: Insufficient documentation

## 2014-09-29 DIAGNOSIS — Z5111 Encounter for antineoplastic chemotherapy: Secondary | ICD-10-CM | POA: Diagnosis not present

## 2014-09-29 DIAGNOSIS — M8580 Other specified disorders of bone density and structure, unspecified site: Secondary | ICD-10-CM

## 2014-09-29 DIAGNOSIS — Z170 Estrogen receptor positive status [ER+]: Secondary | ICD-10-CM | POA: Diagnosis not present

## 2014-09-29 DIAGNOSIS — Z5189 Encounter for other specified aftercare: Secondary | ICD-10-CM

## 2014-09-29 DIAGNOSIS — Z79899 Other long term (current) drug therapy: Secondary | ICD-10-CM

## 2014-09-29 DIAGNOSIS — L732 Hidradenitis suppurativa: Secondary | ICD-10-CM | POA: Diagnosis not present

## 2014-09-29 LAB — CBC WITH DIFFERENTIAL/PLATELET
Basophils Absolute: 0 10*3/uL (ref 0–0.1)
Basophils Relative: 0 %
Eosinophils Absolute: 0 10*3/uL (ref 0–0.7)
Eosinophils Relative: 0 %
HCT: 41 % (ref 35.0–47.0)
Hemoglobin: 13.4 g/dL (ref 12.0–16.0)
Lymphocytes Relative: 9 %
Lymphs Abs: 1.4 10*3/uL (ref 1.0–3.6)
MCH: 27 pg (ref 26.0–34.0)
MCHC: 32.8 g/dL (ref 32.0–36.0)
MCV: 82.4 fL (ref 80.0–100.0)
Monocytes Absolute: 0.2 10*3/uL (ref 0.2–0.9)
Monocytes Relative: 2 %
Neutro Abs: 13.7 10*3/uL — ABNORMAL HIGH (ref 1.4–6.5)
Neutrophils Relative %: 89 %
Platelets: 210 10*3/uL (ref 150–440)
RBC: 4.97 MIL/uL (ref 3.80–5.20)
RDW: 13.4 % (ref 11.5–14.5)
WBC: 15.3 10*3/uL — ABNORMAL HIGH (ref 3.6–11.0)

## 2014-09-29 LAB — COMPREHENSIVE METABOLIC PANEL
ALT: 22 U/L (ref 14–54)
AST: 27 U/L (ref 15–41)
Albumin: 4 g/dL (ref 3.5–5.0)
Alkaline Phosphatase: 81 U/L (ref 38–126)
Anion gap: 10 (ref 5–15)
BUN: 12 mg/dL (ref 6–20)
CO2: 18 mmol/L — ABNORMAL LOW (ref 22–32)
Calcium: 9.4 mg/dL (ref 8.9–10.3)
Chloride: 108 mmol/L (ref 101–111)
Creatinine, Ser: 0.9 mg/dL (ref 0.44–1.00)
GFR calc Af Amer: >60 mL/min (ref >60)
GFR calc non Af Amer: >60 mL/min (ref >60)
Glucose, Bld: 275 mg/dL — ABNORMAL HIGH (ref 65–99)
Potassium: 3.7 mmol/L (ref 3.5–5.1)
Sodium: 136 mmol/L (ref 135–145)
Total Bilirubin: 0.4 mg/dL (ref 0.3–1.2)
Total Protein: 7.9 g/dL (ref 6.5–8.1)

## 2014-09-29 LAB — MAGNESIUM: Magnesium: 2 mg/dL (ref 1.7–2.4)

## 2014-09-29 MED ORDER — ONDANSETRON HCL 8 MG PO TABS: 8.0000 mg | ORAL_TABLET | Freq: Three times a day (TID) | ORAL | Status: DC | PRN

## 2014-09-29 NOTE — Progress Notes (Signed)
Pt here today for follow up regarding breast cancer and results today and treatment; c/o left arm pain 8/10

## 2014-09-30 ENCOUNTER — Inpatient Hospital Stay: Payer: Commercial Managed Care - HMO

## 2014-09-30 VITALS — BP 156/77 | HR 56 | Temp 97.1°F | Resp 20

## 2014-09-30 DIAGNOSIS — Z170 Estrogen receptor positive status [ER+]: Secondary | ICD-10-CM | POA: Diagnosis not present

## 2014-09-30 DIAGNOSIS — M8580 Other specified disorders of bone density and structure, unspecified site: Secondary | ICD-10-CM | POA: Diagnosis not present

## 2014-09-30 DIAGNOSIS — Z5111 Encounter for antineoplastic chemotherapy: Secondary | ICD-10-CM | POA: Diagnosis not present

## 2014-09-30 DIAGNOSIS — Z5189 Encounter for other specified aftercare: Secondary | ICD-10-CM | POA: Diagnosis not present

## 2014-09-30 DIAGNOSIS — C50912 Malignant neoplasm of unspecified site of left female breast: Secondary | ICD-10-CM

## 2014-09-30 DIAGNOSIS — R160 Hepatomegaly, not elsewhere classified: Secondary | ICD-10-CM | POA: Diagnosis not present

## 2014-09-30 DIAGNOSIS — R7989 Other specified abnormal findings of blood chemistry: Secondary | ICD-10-CM | POA: Diagnosis not present

## 2014-09-30 DIAGNOSIS — I10 Essential (primary) hypertension: Secondary | ICD-10-CM | POA: Diagnosis not present

## 2014-09-30 DIAGNOSIS — Z79899 Other long term (current) drug therapy: Secondary | ICD-10-CM | POA: Diagnosis not present

## 2014-09-30 MED ORDER — SODIUM CHLORIDE 0.9 % IJ SOLN
10.0000 mL | INTRAMUSCULAR | Status: DC | PRN
  Filled 2014-09-30: qty 10

## 2014-09-30 MED ORDER — FAMOTIDINE IN NACL 20-0.9 MG/50ML-% IV SOLN
20.0000 mg | Freq: Once | INTRAVENOUS | Status: AC | PRN
Start: 2014-09-30 — End: 2014-09-30
  Administered 2014-09-30: 20 mg via INTRAVENOUS
  Filled 2014-09-30: qty 50

## 2014-09-30 MED ORDER — SODIUM CHLORIDE 0.9 % IV SOLN
Freq: Once | INTRAVENOUS | Status: AC
  Administered 2014-09-30: 10:00:00 via INTRAVENOUS
  Filled 2014-09-30: qty 250

## 2014-09-30 MED ORDER — PALONOSETRON HCL INJECTION 0.25 MG/5ML
0.2500 mg | Freq: Once | INTRAVENOUS | Status: AC
  Administered 2014-09-30: 0.25 mg via INTRAVENOUS
  Filled 2014-09-30: qty 5

## 2014-09-30 MED ORDER — DIPHENHYDRAMINE HCL 50 MG/ML IJ SOLN
25.0000 mg | Freq: Once | INTRAMUSCULAR | Status: AC | PRN
  Administered 2014-09-30: 25 mg via INTRAVENOUS
  Filled 2014-09-30: qty 1

## 2014-09-30 MED ORDER — DOCETAXEL CHEMO INJECTION 160 MG/16ML
75.0000 mg/m2 | Freq: Once | INTRAVENOUS | Status: AC
  Administered 2014-09-30: 160 mg via INTRAVENOUS
  Filled 2014-09-30: qty 16

## 2014-09-30 MED ORDER — SODIUM CHLORIDE 0.9 % IV SOLN
Freq: Once | INTRAVENOUS | Status: AC
  Administered 2014-09-30: 10:00:00 via INTRAVENOUS
  Filled 2014-09-30: qty 5

## 2014-09-30 MED ORDER — SODIUM CHLORIDE 0.9 % IV SOLN
600.0000 mg/m2 | Freq: Once | INTRAVENOUS | Status: AC
  Administered 2014-09-30: 1300 mg via INTRAVENOUS
  Filled 2014-09-30: qty 40

## 2014-09-30 MED ORDER — HEPARIN SOD (PORK) LOCK FLUSH 100 UNIT/ML IV SOLN
500.0000 [IU] | Freq: Once | INTRAVENOUS | Status: DC | PRN
  Filled 2014-09-30: qty 5

## 2014-09-30 MED ORDER — SODIUM CHLORIDE 0.9 % IV SOLN: Freq: Once | INTRAVENOUS | Status: DC

## 2014-09-30 NOTE — Progress Notes (Signed)
[facility] day:  09/30/2014  Chief Complaint: Donna Riley is an 54 y.o. female with stage IIA left breast cancer who is seen for assessment prior to cycle #1 Taxotere and Cytoxan.  HPI:  The patient was last seen in the medical oncology clinic on 09/18/2014.  At that time, decision was made to pursue additional imaging given her symptomatology.  She noted right upper quadrant pain and low back pain.  Prior bone scan on 09/16/2014 revealed abnormal focal uptake at the level of right L3 pedicle and L5 vertebral body.  She went to chemotherapy class.  Abdominal and pelvic CT scan on 09/24/2014 revealed hepatomegaly and no evidence of metastatic disease.  There was possible constipation.  Lumbar spine MRI on 09/27/2014 revealed no evidence of metastatic disease with lower lumbar facet arthritis.  Symptomatically, she voices no new complaint.  She took her steroids last night.  She describes hot flashes last night.  She has left arm aches "like a tooth ache".  Past Medical History  Diagnosis Date  . Prediabetes   . Elevated LFTs   . Positive PPD, treated   . Hypertension   . Arthritis   . Breast cancer     left    Past Surgical History  Procedure Laterality Date  . Cholecystectomy    . Tubal ligation Bilateral   . Breast lumpectomy with needle localization and axillary sentinel lymph node bx Left   . Breast biopsy Left     Family History  Problem Relation Age of Onset  . Cancer Mother     Pancreatic  . Cancer Father     Throat  . Cancer Maternal Grandmother     Breast    Social History:  reports that she has been smoking.  She does not have any smokeless tobacco history on file. She reports that she drinks alcohol. Her drug history is not on file.  Allergies: No Known Allergies  Current Outpatient Prescriptions  Medication Sig Dispense Refill  . dexamethasone (DECADRON) 4 MG tablet Take 4 mg by mouth 2 (two) times daily with  a meal.    . HYDROcodone-acetaminophen (NORCO/VICODIN) 5-325 MG per tablet Take 1-2 tablets by mouth every 4 (four) hours as needed for moderate pain.    Marland Kitchen lisinopril (PRINIVIL,ZESTRIL) 20 MG tablet Take 20 mg by mouth daily.    . ondansetron (ZOFRAN) 8 MG tablet Take 1 tablet (8 mg total) by mouth every 8 (eight) hours as needed for nausea or vomiting. 20 tablet 0   No current facility-administered medications for this visit.   Facility-Administered Medications Ordered in Other Visits  Medication Dose Route Frequency Provider Last Rate Last Dose  . heparin lock flush 100 unit/mL  500 Units Intracatheter Once PRN Lequita Asal, MD      . sodium chloride 0.9 % injection 10 mL  10 mL Intracatheter PRN Lequita Asal, MD        ROS  GENERAL:  Feels "OK".  No fevers, sweats or weight loss. PERFORMANCE STATUS (ECOG):  1 HEENT:  No visual changes, runny nose, sore throat, mouth sores or tenderness. Lungs: No shortness of breath or cough.  No hemoptysis. Cardiac:  No chest pain, palpitations, orthopnea, or PND. GI:  No nausea, vomiting, diarrhea, constipation, melena or hematochezia. GU:  No urgency, frequency, dysuria, or hematuria. Musculoskeletal:  Low back pain (stable).  No joint pain.  No muscle tenderness. Extremities:  Left arm ache "like a tooth ache".  Skin:  No rashes or skin changes. Neuro:  No headache, numbness or weakness, balance or coordination issues. Endocrine:  Hot flashes.  Pre diabetes. Psych:  No mood changes, depression or anxiety. Pain:  No focal pain. Review of systems:  All other systems reviewed and found to be negative.  Physical Exam  Blood pressure 162/89, pulse 80, resp. rate 20, height 5' 8" (1.727 m), weight 219 lb 5.7 oz (99.499 kg).  GENERAL:  Well developed, well nourished woman sitting comfortably in the exam room in no acute distress. MENTAL STATUS:  Alert and oriented to person, place and time. HEAD:  Brown hair.  Normocephalic, atraumatic,  face symmetric, no Cushingoid features. EYES:  Brown eyes.  Pupils equal round and reactive to light and accomodation.  No conjunctivitis or scleral icterus. ENT:  Oropharynx clear without lesion.  Tongue normal. Mucous membranes moist.  RESPIRATORY:  Clear to auscultation without rales, wheezes or rhonchi. CARDIOVASCULAR:  Regular rate and rhythm without murmur, rub or gallop. ABDOMEN:  Soft, non-tender, with active bowel sounds, and no hepatosplenomegaly.  No masses. SKIN:  No rashes, ulcers or lesions. EXTREMITIES: No edema, no skin discoloration or tenderness.  No palpable cords. LYMPH NODES: No palpable cervical, supraclavicular, axillary or inguinal adenopathy  NEUROLOGICAL: Unremarkable. PSYCH:  Appropriate.   Clinical Support on 09/29/2014  Component Date Value Ref Range Status  . WBC 09/29/2014 15.3* 3.6 - 11.0 K/uL Final  . RBC 09/29/2014 4.97  3.80 - 5.20 MIL/uL Final  . Hemoglobin 09/29/2014 13.4  12.0 - 16.0 g/dL Final  . HCT 09/29/2014 41.0  35.0 - 47.0 % Final  . MCV 09/29/2014 82.4  80.0 - 100.0 fL Final  . MCH 09/29/2014 27.0  26.0 - 34.0 pg Final  . MCHC 09/29/2014 32.8  32.0 - 36.0 g/dL Final  . RDW 09/29/2014 13.4  11.5 - 14.5 % Final  . Platelets 09/29/2014 210  150 - 440 K/uL Final  . Neutrophils Relative % 09/29/2014 89%   Final  . Neutro Abs 09/29/2014 13.7* 1.4 - 6.5 K/uL Final  . Lymphocytes Relative 09/29/2014 9%   Final  . Lymphs Abs 09/29/2014 1.4  1.0 - 3.6 K/uL Final  . Monocytes Relative 09/29/2014 2%   Final  . Monocytes Absolute 09/29/2014 0.2  0.2 - 0.9 K/uL Final  . Eosinophils Relative 09/29/2014 0%   Final  . Eosinophils Absolute 09/29/2014 0.0  0 - 0.7 K/uL Final  . Basophils Relative 09/29/2014 0%   Final  . Basophils Absolute 09/29/2014 0.0  0 - 0.1 K/uL Final  . Sodium 09/29/2014 136  135 - 145 mmol/L Final  . Potassium 09/29/2014 3.7  3.5 - 5.1 mmol/L Final  . Chloride 09/29/2014 108  101 - 111 mmol/L Final  . CO2 09/29/2014 18* 22 - 32  mmol/L Final  . Glucose, Bld 09/29/2014 275* 65 - 99 mg/dL Final  . BUN 09/29/2014 12  6 - 20 mg/dL Final  . Creatinine, Ser 09/29/2014 0.90  0.44 - 1.00 mg/dL Final  . Calcium 09/29/2014 9.4  8.9 - 10.3 mg/dL Final  . Total Protein 09/29/2014 7.9  6.5 - 8.1 g/dL Final  . Albumin 09/29/2014 4.0  3.5 - 5.0 g/dL Final  . AST 09/29/2014 27  15 - 41 U/L Final  . ALT 09/29/2014 22  14 - 54 U/L Final  . Alkaline Phosphatase 09/29/2014 81  38 - 126 U/L Final  . Total Bilirubin 09/29/2014 0.4  0.3 - 1.2 mg/dL Final  . GFR calc non Af  Amer 09/29/2014 >60  >60 mL/min Final  . GFR calc Af Amer 09/29/2014 >60  >60 mL/min Final   Comment: (NOTE) The eGFR has been calculated using the CKD EPI equation. This calculation has not been validated in all clinical situations. eGFR's persistently <90 mL/min signify possible Chronic Kidney Disease.   . Anion gap 09/29/2014 10  5 - 15 Final  . Magnesium 09/29/2014 2.0  1.7 - 2.4 mg/dL Final    Assessment:  The patient is a 54 year old [place] woman with stage IIA left breast cancer status post partial mastectomy and sentinel lymph node biopsy on 08/27/2014.  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  There was lymphovascular invasion.  One of 2 sentinel lymph nodes were positive  (focus of 2.8 mm).  Tumor was > 90% ER positive, > 90% PR positive, and Her2/neu negative.  Pathologic stage was [id number].  Bone scan on 09/16/2014 revealed  abnormal focal uptake at the level of right L3 pedicle and L5 vertebral body.  Lumbar spine MRI on 09/27/2014 revealed no evidence of metastatic disease with lower lumbar facet arthritis.  Abdominal and pelvic CT scan on 09/24/2014 revealed hepatomegaly and no evidence of metastatic disease.    Bone density study on 09/15/2014 revealed a T-score of -2.1 at L1-L4 (osteopenia).  Symptomatically, she voices no new complaint.  She took her steroids last  night.  She had hot flashes last night.  Her left arm aches "like a tooth ache".  She has chronic RUQ pain.  Plan: 1)  Review results from bone density study, abdominal CT scan, and lumbar spine MRI. 2)  Discuss treatment plan with 4 cycles of Taxotere and Cytoxan (TC) with Neulasta support every 21 days.  Side effects of chemotherapy re-reviewed.  Discussed steroids on the day before and day after chemotherapy (steroids will be given in infusion center on the day of chemotherapy).  Discussed Neulasta 24 hours after chemotherapy.  Discussed side effects and use of Claritin to prevent Neulasta induced bone pain.  Discussed nadir counts in 10 days.  Patient consented to treatment. 3)  Labs today:  CBC with diff, CMP, Mg. 4)  Cycle #1 TC today. 5)  RTC tomorrow for Neulasta. 6)  RTC in 10 days for CBC with diff. 7)  RTC in 3 weeks for MD assessment, labs (CBC with diff, CMP, Mg) and cycle #2 TC.  Addendum:  The patient will begin cycle #1 tomorrow as inadvertantly she was not on the infusion schedule today with the switch to Epic today.  Neulasta will be given 24 hours later.  She was contacted regarding her elevated blood sugar.  Her PCP will also be notified given her diagnosis of pre-diabetes and need for blood sugar monitoring and potential need for SSI.  Lequita Asal, MD  09/30/2014, 1:53 PM

## 2014-09-30 NOTE — Progress Notes (Signed)
Pt notified on elevated blood sugar per Dr. Mike Gip

## 2014-09-30 NOTE — Patient Instructions (Addendum)
[facility] Discharge Instructions for Patients Receiving Chemotherapy  Today you received the following chemotherapy agents Taxotere and Cytoxan.  To help prevent nausea and vomiting after your treatment, we encourage you to take your nausea medications as directed.   If you develop nausea and vomiting that is not controlled by your nausea medication, call the clinic.   BELOW ARE SYMPTOMS THAT SHOULD BE REPORTED IMMEDIATELY:  *FEVER GREATER THAN 100.5 F  *CHILLS WITH OR WITHOUT FEVER  NAUSEA AND VOMITING THAT IS NOT CONTROLLED WITH YOUR NAUSEA MEDICATION  *UNUSUAL SHORTNESS OF BREATH  *UNUSUAL BRUISING OR BLEEDING  TENDERNESS IN MOUTH AND THROAT WITH OR WITHOUT PRESENCE OF ULCERS  *URINARY PROBLEMS  *BOWEL PROBLEMS  UNUSUAL RASH Items with * indicate a potential emergency and should be followed up as soon as possible.  Feel free to call the clinic you have any questions or concerns. The clinic phone number is [phone].  Please show the [facility] at check-in to the Emergency Department and triage nurse.  Cyclophosphamide injection What is this medicine? CYCLOPHOSPHAMIDE (sye kloe FOSS fa mide) is a chemotherapy drug. It slows the growth of cancer cells. This medicine is used to treat many types of cancer like lymphoma, myeloma, leukemia, breast cancer, and ovarian cancer, to name a few. This medicine may be used for other purposes; ask your health care provider or pharmacist if you have questions. COMMON BRAND NAME(S): Cytoxan, Neosar What should I tell my health care provider before I take this medicine? They need to know if you have any of these conditions: -blood disorders -history of other chemotherapy -infection -kidney disease -liver disease -recent or ongoing radiation therapy -tumors in the bone marrow -an unusual or allergic reaction to cyclophosphamide, other chemotherapy, other medicines, foods, dyes, or  preservatives -pregnant or trying to get pregnant -breast-feeding How should I use this medicine? This drug is usually given as an injection into a vein or muscle or by infusion into a vein. It is administered in a hospital or clinic by a specially trained health care professional. Talk to your pediatrician regarding the use of this medicine in children. Special care may be needed. Overdosage: If you think you have taken too much of this medicine contact a poison control center or emergency room at once. NOTE: This medicine is only for you. Do not share this medicine with others. What if I miss a dose? It is important not to miss your dose. Call your doctor or health care professional if you are unable to keep an appointment. What may interact with this medicine? This medicine may interact with the following medications: -amiodarone -amphotericin B -azathioprine -certain antiviral medicines for HIV or AIDS such as protease inhibitors (e.g., indinavir, ritonavir) and zidovudine -certain blood pressure medications such as benazepril, captopril, enalapril, fosinopril, lisinopril, moexipril, monopril, perindopril, quinapril, ramipril, trandolapril -certain cancer medications such as anthracyclines (e.g., daunorubicin, doxorubicin), busulfan, cytarabine, paclitaxel, pentostatin, tamoxifen, trastuzumab -certain diuretics such as chlorothiazide, chlorthalidone, hydrochlorothiazide, indapamide, metolazone -certain medicines that treat or prevent blood clots like warfarin -certain muscle relaxants such as succinylcholine -cyclosporine -etanercept -indomethacin -medicines to increase blood counts like filgrastim, pegfilgrastim, sargramostim -medicines used as general anesthesia -metronidazole -natalizumab This list may not describe all possible interactions. Give your health care provider a list of all the medicines, herbs, non-prescription drugs, or dietary supplements you use. Also tell them if  you smoke, drink alcohol, or use illegal drugs. Some items may interact with your medicine. What should I  watch for while using this medicine? Visit your doctor for checks on your progress. This drug may make you feel generally unwell. This is not uncommon, as chemotherapy can affect healthy cells as well as cancer cells. Report any side effects. Continue your course of treatment even though you feel ill unless your doctor tells you to stop. Drink water or other fluids as directed. Urinate often, even at night. In some cases, you may be given additional medicines to help with side effects. Follow all directions for their use. Call your doctor or health care professional for advice if you get a fever, chills or sore throat, or other symptoms of a cold or flu. Do not treat yourself. This drug decreases your body's ability to fight infections. Try to avoid being around people who are sick. This medicine may increase your risk to bruise or bleed. Call your doctor or health care professional if you notice any unusual bleeding. Be careful brushing and flossing your teeth or using a toothpick because you may get an infection or bleed more easily. If you have any dental work done, tell your dentist you are receiving this medicine. You may get drowsy or dizzy. Do not drive, use machinery, or do anything that needs mental alertness until you know how this medicine affects you. Do not become pregnant while taking this medicine or for 1 year after stopping it. Women should inform their doctor if they wish to become pregnant or think they might be pregnant. Men should not father a child while taking this medicine and for 4 months after stopping it. There is a potential for serious side effects to an unborn child. Talk to your health care professional or pharmacist for more information. Do not breast-feed an infant while taking this medicine. This medicine may interfere with the ability to have a child. This medicine  has caused ovarian failure in some women. This medicine has caused reduced sperm counts in some men. You should talk with your doctor or health care professional if you are concerned about your fertility. If you are going to have surgery, tell your doctor or health care professional that you have taken this medicine. What side effects may I notice from receiving this medicine? Side effects that you should report to your doctor or health care professional as soon as possible: -allergic reactions like skin rash, itching or hives, swelling of the face, lips, or tongue -low blood counts - this medicine may decrease the number of white blood cells, red blood cells and platelets. You may be at increased risk for infections and bleeding. -signs of infection - fever or chills, cough, sore throat, pain or difficulty passing urine -signs of decreased platelets or bleeding - bruising, pinpoint red spots on the skin, black, tarry stools, blood in the urine -signs of decreased red blood cells - unusually weak or tired, fainting spells, lightheadedness -breathing problems -dark urine -dizziness -palpitations -swelling of the ankles, feet, hands -trouble passing urine or change in the amount of urine -weight gain -yellowing of the eyes or skin Side effects that usually do not require medical attention (report to your doctor or health care professional if they continue or are bothersome): -changes in nail or skin color -hair loss -missed menstrual periods -mouth sores -nausea, vomiting This list may not describe all possible side effects. Call your doctor for medical advice about side effects. You may report side effects to FDA at 1-800-FDA-1088. Where should I keep my medicine? This drug is given in a  hospital or clinic and will not be stored at home. NOTE: This sheet is a summary. It may not cover all possible information. If you have questions about this medicine, talk to your doctor, pharmacist, or  health care provider.  2015, Elsevier/Gold Standard. (2012-03-30 16:22:58) Docetaxel injection What is this medicine? DOCETAXEL (doe se TAX el) is a chemotherapy drug. It targets fast dividing cells, like cancer cells, and causes these cells to die. This medicine is used to treat many types of cancers like breast cancer, certain stomach cancers, head and neck cancer, lung cancer, and prostate cancer. This medicine may be used for other purposes; ask your health care provider or pharmacist if you have questions. COMMON BRAND NAME(S): Docefrez, Taxotere What should I tell my health care provider before I take this medicine? They need to know if you have any of these conditions: -infection (especially a virus infection such as chickenpox, cold sores, or herpes) -liver disease -low blood counts, like low white cell, platelet, or red cell counts -an unusual or allergic reaction to docetaxel, polysorbate 80, other chemotherapy agents, other medicines, foods, dyes, or preservatives -pregnant or trying to get pregnant -breast-feeding How should I use this medicine? This drug is given as an infusion into a vein. It is administered in a hospital or clinic by a specially trained health care professional. Talk to your pediatrician regarding the use of this medicine in children. Special care may be needed. Overdosage: If you think you have taken too much of this medicine contact a poison control center or emergency room at once. NOTE: This medicine is only for you. Do not share this medicine with others. What if I miss a dose? It is important not to miss your dose. Call your doctor or health care professional if you are unable to keep an appointment. What may interact with this medicine? -cyclosporine -erythromycin -ketoconazole -medicines to increase blood counts like filgrastim, pegfilgrastim, sargramostim -vaccines Talk to your doctor or health care professional before taking any of these  medicines: -acetaminophen -aspirin -ibuprofen -ketoprofen -naproxen This list may not describe all possible interactions. Give your health care provider a list of all the medicines, herbs, non-prescription drugs, or dietary supplements you use. Also tell them if you smoke, drink alcohol, or use illegal drugs. Some items may interact with your medicine. What should I watch for while using this medicine? Your condition will be monitored carefully while you are receiving this medicine. You will need important blood work done while you are taking this medicine. This drug may make you feel generally unwell. This is not uncommon, as chemotherapy can affect healthy cells as well as cancer cells. Report any side effects. Continue your course of treatment even though you feel ill unless your doctor tells you to stop. In some cases, you may be given additional medicines to help with side effects. Follow all directions for their use. Call your doctor or health care professional for advice if you get a fever, chills or sore throat, or other symptoms of a cold or flu. Do not treat yourself. This drug decreases your body's ability to fight infections. Try to avoid being around people who are sick. This medicine may increase your risk to bruise or bleed. Call your doctor or health care professional if you notice any unusual bleeding. Be careful brushing and flossing your teeth or using a toothpick because you may get an infection or bleed more easily. If you have any dental work done, tell your dentist you are receiving  this medicine. Avoid taking products that contain aspirin, acetaminophen, ibuprofen, naproxen, or ketoprofen unless instructed by your doctor. These medicines may hide a fever. This medicine contains an alcohol in the product. You may get drowsy or dizzy. Do not drive, use machinery, or do anything that needs mental alertness until you know how this medicine affects you. Do not stand or sit up  quickly, especially if you are an older patient. This reduces the risk of dizzy or fainting spells. Avoid alcoholic drinks Do not become pregnant while taking this medicine. Women should inform their doctor if they wish to become pregnant or think they might be pregnant. There is a potential for serious side effects to an unborn child. Talk to your health care professional or pharmacist for more information. Do not breast-feed an infant while taking this medicine. What side effects may I notice from receiving this medicine? Side effects that you should report to your doctor or health care professional as soon as possible: -allergic reactions like skin rash, itching or hives, swelling of the face, lips, or tongue -low blood counts - This drug may decrease the number of white blood cells, red blood cells and platelets. You may be at increased risk for infections and bleeding. -signs of infection - fever or chills, cough, sore throat, pain or difficulty passing urine -signs of decreased platelets or bleeding - bruising, pinpoint red spots on the skin, black, tarry stools, nosebleeds -signs of decreased red blood cells - unusually weak or tired, fainting spells, lightheadedness -breathing problems -fast or irregular heartbeat -low blood pressure -mouth sores -nausea and vomiting -pain, swelling, redness or irritation at the injection site -pain, tingling, numbness in the hands or feet -swelling of the ankle, feet, hands -weight gain Side effects that usually do not require medical attention (report to your prescriber or health care professional if they continue or are bothersome): -bone pain -complete hair loss including hair on your head, underarms, pubic hair, eyebrows, and eyelashes -diarrhea -excessive tearing -changes in the color of fingernails -loosening of the fingernails -nausea -muscle pain -red flush to skin -sweating -weak or tired This list may not describe all possible side  effects. Call your doctor for medical advice about side effects. You may report side effects to FDA at 1-800-FDA-1088. Where should I keep my medicine? This drug is given in a hospital or clinic and will not be stored at home. NOTE: This sheet is a summary. It may not cover all possible information. If you have questions about this medicine, talk to your doctor, pharmacist, or health care provider.  2015, Elsevier/Gold Standard. (2013-04-11 22:21:02)

## 2014-09-30 NOTE — Progress Notes (Signed)
Instructed patients to bring meds on next visit.  Unable to reconcile medication list due to patient not knowing all medications she is taking.

## 2014-10-01 ENCOUNTER — Inpatient Hospital Stay: Payer: Commercial Managed Care - HMO

## 2014-10-01 VITALS — BP 188/85 | HR 80 | Temp 97.0°F | Resp 22

## 2014-10-01 DIAGNOSIS — I10 Essential (primary) hypertension: Secondary | ICD-10-CM | POA: Diagnosis not present

## 2014-10-01 DIAGNOSIS — R160 Hepatomegaly, not elsewhere classified: Secondary | ICD-10-CM | POA: Diagnosis not present

## 2014-10-01 DIAGNOSIS — C50912 Malignant neoplasm of unspecified site of left female breast: Secondary | ICD-10-CM | POA: Diagnosis not present

## 2014-10-01 DIAGNOSIS — Z5111 Encounter for antineoplastic chemotherapy: Secondary | ICD-10-CM | POA: Diagnosis not present

## 2014-10-01 DIAGNOSIS — M8580 Other specified disorders of bone density and structure, unspecified site: Secondary | ICD-10-CM | POA: Diagnosis not present

## 2014-10-01 DIAGNOSIS — R7989 Other specified abnormal findings of blood chemistry: Secondary | ICD-10-CM | POA: Diagnosis not present

## 2014-10-01 DIAGNOSIS — C50919 Malignant neoplasm of unspecified site of unspecified female breast: Secondary | ICD-10-CM

## 2014-10-01 DIAGNOSIS — Z79899 Other long term (current) drug therapy: Secondary | ICD-10-CM | POA: Diagnosis not present

## 2014-10-01 DIAGNOSIS — Z5189 Encounter for other specified aftercare: Secondary | ICD-10-CM | POA: Diagnosis not present

## 2014-10-01 DIAGNOSIS — Z170 Estrogen receptor positive status [ER+]: Secondary | ICD-10-CM | POA: Diagnosis not present

## 2014-10-01 MED ORDER — PEGFILGRASTIM INJECTION 6 MG/0.6ML ~~LOC~~
6.0000 mg | PREFILLED_SYRINGE | Freq: Once | SUBCUTANEOUS | Status: AC
  Administered 2014-10-01: 6 mg via SUBCUTANEOUS
  Filled 2014-10-01: qty 0.6

## 2014-10-03 ENCOUNTER — Telehealth: Payer: Self-pay | Admitting: *Deleted

## 2014-10-03 ENCOUNTER — Inpatient Hospital Stay: Payer: Commercial Managed Care - HMO | Admitting: Hematology and Oncology

## 2014-10-03 NOTE — Telephone Encounter (Signed)
Not my patient

## 2014-10-03 NOTE — Telephone Encounter (Signed)
This is not related to her cancer, if she was going ot have a reaction, it would be all over and not just between her legs, Have her contact her PMD and if he cannot see her, I can see her this afternoon  I called patient who states she did not have this problem until after she had chemotherapy and that she is developing blisters on her face now. Appt given to pt for 1:30 today to be evaluated and she agrees to this appt time

## 2014-10-03 NOTE — Telephone Encounter (Signed)
Has a burning rash between her legs, does not itch, just burns and reports her husband said it is darkened colored. Also reports that there was blood on toilet paper when she wiped this morning. Denies hemorrhoids

## 2014-10-04 ENCOUNTER — Telehealth: Payer: Self-pay | Admitting: Hematology and Oncology

## 2014-10-04 ENCOUNTER — Other Ambulatory Visit: Payer: Self-pay | Admitting: Hematology and Oncology

## 2014-10-04 DIAGNOSIS — C50912 Malignant neoplasm of unspecified site of left female breast: Secondary | ICD-10-CM

## 2014-10-04 MED ORDER — OXYCODONE-ACETAMINOPHEN 5-325 MG PO TABS: 1.0000 | ORAL_TABLET | Freq: Four times a day (QID) | ORAL | Status: DC | PRN

## 2014-10-04 NOTE — Telephone Encounter (Signed)
The patient called on the evening of 10/03/2014 secondary to diffuse pain. Pain appeared to be associated with her Neulasta. She was taking Claritin. I encouraged her to take Tylenol as needed for pain. If her pain did not improve, she was to contact me on 10/04/2014.  On 10/04/2014, she was still having diffuse bone pain. I discussed the use of the Lortab or Percocet. She stated that the Lortab she had taken in the past (hydrocodone 5/Tylenol 325) was ineffective. I discussed oxycodone 5/Tylenol 325 one every 6 hours as needed for pain.A prescription for 20 pills was provided. As it was the weekend (Saturday), she met me outside of the [facility]. She was to contact me if her pain did not improve.  The patient had noted to [facility] on 10/03/2014 that she had a rash on her groin. She denied a rash to me, but noted pain in her bones as well as her back in other places. I  inquired about "blisters on her face" reported to [facility]. She stated that she did not have blisters but had pimples. These were likely due to her Decadron. I reviewed that she is just take Decadron the day before, the day of, and the day after chemotherapy.   Patient was encouraged to contact the clinic if she had further problems. She will next be seen for nadir counts after cycle #1 TC.

## 2014-10-04 NOTE — Telephone Encounter (Signed)
See recent note

## 2014-10-05 ENCOUNTER — Encounter: Payer: Self-pay | Admitting: Hematology and Oncology

## 2014-10-06 ENCOUNTER — Other Ambulatory Visit: Payer: Self-pay | Admitting: Hematology and Oncology

## 2014-10-09 ENCOUNTER — Inpatient Hospital Stay: Payer: Commercial Managed Care - HMO

## 2014-10-09 DIAGNOSIS — Z170 Estrogen receptor positive status [ER+]: Secondary | ICD-10-CM | POA: Diagnosis not present

## 2014-10-09 DIAGNOSIS — Z5111 Encounter for antineoplastic chemotherapy: Secondary | ICD-10-CM | POA: Diagnosis not present

## 2014-10-09 DIAGNOSIS — I10 Essential (primary) hypertension: Secondary | ICD-10-CM | POA: Diagnosis not present

## 2014-10-09 DIAGNOSIS — M8580 Other specified disorders of bone density and structure, unspecified site: Secondary | ICD-10-CM | POA: Diagnosis not present

## 2014-10-09 DIAGNOSIS — R7989 Other specified abnormal findings of blood chemistry: Secondary | ICD-10-CM | POA: Diagnosis not present

## 2014-10-09 DIAGNOSIS — Z79899 Other long term (current) drug therapy: Secondary | ICD-10-CM | POA: Diagnosis not present

## 2014-10-09 DIAGNOSIS — Z5189 Encounter for other specified aftercare: Secondary | ICD-10-CM | POA: Diagnosis not present

## 2014-10-09 DIAGNOSIS — R160 Hepatomegaly, not elsewhere classified: Secondary | ICD-10-CM | POA: Diagnosis not present

## 2014-10-09 DIAGNOSIS — C50912 Malignant neoplasm of unspecified site of left female breast: Secondary | ICD-10-CM

## 2014-10-09 LAB — CBC WITH DIFFERENTIAL/PLATELET
Basophils Absolute: 0.1 10*3/uL (ref 0–0.1)
Basophils Relative: 0 %
Eosinophils Absolute: 0 10*3/uL (ref 0–0.7)
Eosinophils Relative: 0 %
HCT: 40.3 % (ref 35.0–47.0)
Hemoglobin: 13 g/dL (ref 12.0–16.0)
Lymphocytes Relative: 17 %
Lymphs Abs: 3.3 10*3/uL (ref 1.0–3.6)
MCH: 26.6 pg (ref 26.0–34.0)
MCHC: 32.3 g/dL (ref 32.0–36.0)
MCV: 82.2 fL (ref 80.0–100.0)
Monocytes Absolute: 1 10*3/uL — ABNORMAL HIGH (ref 0.2–0.9)
Monocytes Relative: 5 %
Neutro Abs: 15.7 10*3/uL — ABNORMAL HIGH (ref 1.4–6.5)
Neutrophils Relative %: 78 %
Platelets: 213 10*3/uL (ref 150–440)
RBC: 4.9 MIL/uL (ref 3.80–5.20)
RDW: 13.4 % (ref 11.5–14.5)
WBC: 20.1 10*3/uL — ABNORMAL HIGH (ref 3.6–11.0)

## 2014-10-14 ENCOUNTER — Telehealth: Payer: Self-pay | Admitting: *Deleted

## 2014-10-14 NOTE — Telephone Encounter (Signed)
"  I have a boil under my arm where I had lymph nodes taken out" I asked her to call Dr Leanora Cover and if no resolution to call Dr Mike Gip back

## 2014-10-17 DIAGNOSIS — Z170 Estrogen receptor positive status [ER+]: Secondary | ICD-10-CM | POA: Diagnosis not present

## 2014-10-17 DIAGNOSIS — E119 Type 2 diabetes mellitus without complications: Secondary | ICD-10-CM | POA: Diagnosis not present

## 2014-10-17 DIAGNOSIS — C50912 Malignant neoplasm of unspecified site of left female breast: Secondary | ICD-10-CM | POA: Diagnosis not present

## 2014-10-17 DIAGNOSIS — L02419 Cutaneous abscess of limb, unspecified: Secondary | ICD-10-CM | POA: Diagnosis not present

## 2014-10-20 ENCOUNTER — Inpatient Hospital Stay (HOSPITAL_BASED_OUTPATIENT_CLINIC_OR_DEPARTMENT_OTHER): Payer: Commercial Managed Care - HMO | Admitting: Hematology and Oncology

## 2014-10-20 ENCOUNTER — Inpatient Hospital Stay: Payer: Commercial Managed Care - HMO

## 2014-10-20 VITALS — BP 164/86 | HR 86 | Temp 97.6°F | Ht 68.0 in | Wt 215.2 lb

## 2014-10-20 DIAGNOSIS — Z170 Estrogen receptor positive status [ER+]: Secondary | ICD-10-CM

## 2014-10-20 DIAGNOSIS — C50212 Malignant neoplasm of upper-inner quadrant of left female breast: Secondary | ICD-10-CM

## 2014-10-20 DIAGNOSIS — I10 Essential (primary) hypertension: Secondary | ICD-10-CM

## 2014-10-20 DIAGNOSIS — Z5111 Encounter for antineoplastic chemotherapy: Secondary | ICD-10-CM | POA: Diagnosis not present

## 2014-10-20 DIAGNOSIS — C773 Secondary and unspecified malignant neoplasm of axilla and upper limb lymph nodes: Secondary | ICD-10-CM | POA: Diagnosis not present

## 2014-10-20 DIAGNOSIS — L02429 Furuncle of limb, unspecified: Secondary | ICD-10-CM | POA: Insufficient documentation

## 2014-10-20 DIAGNOSIS — R7989 Other specified abnormal findings of blood chemistry: Secondary | ICD-10-CM | POA: Diagnosis not present

## 2014-10-20 DIAGNOSIS — Z5189 Encounter for other specified aftercare: Secondary | ICD-10-CM | POA: Diagnosis not present

## 2014-10-20 DIAGNOSIS — C50912 Malignant neoplasm of unspecified site of left female breast: Secondary | ICD-10-CM

## 2014-10-20 DIAGNOSIS — R160 Hepatomegaly, not elsewhere classified: Secondary | ICD-10-CM | POA: Diagnosis not present

## 2014-10-20 DIAGNOSIS — M8580 Other specified disorders of bone density and structure, unspecified site: Secondary | ICD-10-CM | POA: Diagnosis not present

## 2014-10-20 DIAGNOSIS — Z79899 Other long term (current) drug therapy: Secondary | ICD-10-CM | POA: Diagnosis not present

## 2014-10-20 LAB — CBC WITH DIFFERENTIAL/PLATELET
Basophils Absolute: 0.1 10*3/uL (ref 0–0.1)
Basophils Relative: 1 %
Eosinophils Absolute: 0.1 10*3/uL (ref 0–0.7)
Eosinophils Relative: 1 %
HCT: 40.6 % (ref 35.0–47.0)
Hemoglobin: 13.1 g/dL (ref 12.0–16.0)
Lymphocytes Relative: 24 %
Lymphs Abs: 2 10*3/uL (ref 1.0–3.6)
MCH: 27.2 pg (ref 26.0–34.0)
MCHC: 32.4 g/dL (ref 32.0–36.0)
MCV: 83.8 fL (ref 80.0–100.0)
Monocytes Absolute: 0.7 10*3/uL (ref 0.2–0.9)
Monocytes Relative: 9 %
Neutro Abs: 5.4 10*3/uL (ref 1.4–6.5)
Neutrophils Relative %: 65 %
Platelets: 212 10*3/uL (ref 150–440)
RBC: 4.84 MIL/uL (ref 3.80–5.20)
RDW: 13.9 % (ref 11.5–14.5)
WBC: 8.3 10*3/uL (ref 3.6–11.0)

## 2014-10-20 LAB — COMPREHENSIVE METABOLIC PANEL
ALT: 22 U/L (ref 14–54)
AST: 23 U/L (ref 15–41)
Albumin: 3.9 g/dL (ref 3.5–5.0)
Alkaline Phosphatase: 91 U/L (ref 38–126)
Anion gap: 9 (ref 5–15)
BUN: 7 mg/dL (ref 6–20)
CO2: 21 mmol/L — ABNORMAL LOW (ref 22–32)
Calcium: 8.6 mg/dL — ABNORMAL LOW (ref 8.9–10.3)
Chloride: 104 mmol/L (ref 101–111)
Creatinine, Ser: 0.75 mg/dL (ref 0.44–1.00)
GFR calc Af Amer: >60 mL/min (ref >60)
GFR calc non Af Amer: >60 mL/min (ref >60)
Glucose, Bld: 154 mg/dL — ABNORMAL HIGH (ref 65–99)
Potassium: 3.6 mmol/L (ref 3.5–5.1)
Sodium: 134 mmol/L — ABNORMAL LOW (ref 135–145)
Total Bilirubin: 0.4 mg/dL (ref 0.3–1.2)
Total Protein: 7.1 g/dL (ref 6.5–8.1)

## 2014-10-20 LAB — MAGNESIUM: Magnesium: 2 mg/dL (ref 1.7–2.4)

## 2014-10-20 NOTE — Progress Notes (Signed)
Pt here today for follow up regarding breast cancer and treatment; offers no complaints today

## 2014-10-20 NOTE — Progress Notes (Signed)
Donna [facility] day:  10/20/2014  Chief Complaint: Donna Riley is an 54 y.o. female with stage IIA left breast cancer who is seen for assessment prior to cycle #2 Taxotere and Cytoxan.  HPI:  The patient was last seen in the medical oncology [facility] on 09/29/2014.  At that time, she received cycle #1 TC.  She received Neulasta support.  Counts on day 11 (10/09/2014) revealed a hematocrit of 40.3, hemoglobin 13, platelets 213,00, WBC 20,100 with an ANC of 15,700.  Cycle #1 was complicated by a week of Neulasta induced bone pain despite Claritin.  She was given a prescription for Percocet 5/325.  She also notes some transient facial acne.  She notes that her fingers "slid across thing", but now is normal.  She states that she developed pain and tenderness under her arm associated with eruption of a boil.  She was prescribed an antibiotic starting with the letter "A".  She denies any fevers.  She did not take her steroids yesterday.  Past Medical History  Diagnosis Date  . Prediabetes   . Elevated LFTs   . Positive PPD, treated   . Hypertension   . Arthritis   . Breast cancer     left   Past Surgical History  Procedure Laterality Date  . Cholecystectomy    . Tubal ligation Bilateral   . Breast lumpectomy with needle localization and axillary sentinel lymph node bx Left   . Breast biopsy Left    Family History  Problem Relation Age of Onset  . Cancer Mother     Pancreatic  . Cancer Father     Throat  . Cancer Maternal Grandmother     Breast   Social History:  reports that she has been smoking.  She does not have any smokeless tobacco history on file. She reports that she drinks alcohol. Her drug history is not on file.  The patient is accompanied by her husband.  Allergies: No Known Allergies  Current Medications: Current Outpatient Prescriptions  Medication Sig Dispense Refill  . lisinopril (PRINIVIL,ZESTRIL) 20 MG tablet Take 20  mg by mouth daily.    . ondansetron (ZOFRAN) 8 MG tablet Take 1 tablet (8 mg total) by mouth every 8 (eight) hours as needed for nausea or vomiting. 20 tablet 0  . oxyCODONE-acetaminophen (PERCOCET/ROXICET) 5-325 MG per tablet Take 1 tablet by mouth every 6 (six) hours as needed for moderate pain or severe pain. 20 tablet 0  . amoxicillin-clavulanate (AUGMENTIN) 875-125 MG per tablet Take 1 tablet by mouth 2 (two) times daily.    Marland Kitchen BAYER BREEZE 2 TEST DISK     . dexamethasone (DECADRON) 4 MG tablet Take 4 mg by mouth 2 (two) times daily with a meal.    . HYDROcodone-acetaminophen (NORCO/VICODIN) 5-325 MG per tablet Take 1-2 tablets by mouth every 4 (four) hours as needed for moderate pain.    Marland Kitchen MICROLET LANCETS MISC      No current facility-administered medications for this visit.   Review of Systems:  GENERAL:  Feels good.  Active.  No fevers, sweats or weight loss. PERFORMANCE STATUS (ECOG):  1 HEENT:  No visual changes, runny nose, sore throat, mouth sores or tenderness. Lungs: No shortness of breath or cough.  No hemoptysis. Cardiac:  No chest pain, palpitations, orthopnea, or PND. GI:  No nausea, vomiting, diarrhea, constipation, melena or hematochezia. GU:  No urgency, frequency, dysuria, or hematuria. Musculoskeletal:  No back pain.  No  joint pain.  No muscle tenderness. Extremities:  No pain or swelling. Skin:  Left axillary boil. Neuro:  No headache, numbness or weakness, balance or coordination issues. Endocrine:  No diabetes, thyroid issues, hot flashes or night sweats. Psych:  No mood changes, depression or anxiety. Pain:  No focal pain. Review of systems:  All other systems reviewed and found to be negative.   Physical Exam: Blood pressure 164/86, pulse 86, temperature 97.6 F (36.4 C), temperature source Tympanic, height $RemoveBeforeDE'5\' 8"'[id number]$  (1.727 m), weight 215 lb 2.7 oz (97.6 kg). GENERAL:  Well developed, well nourished, sitting comfortably in the exam room in no acute  distress. MENTAL STATUS:  Alert and oriented to person, place and time. HEAD:  Wearing a long brown wig.  Alopecia.  Normocephalic, atraumatic, face symmetric, no Cushingoid features. EYES:  Brown eyes.  Pupils equal round and reactive to light and accomodation.  No conjunctivitis or scleral icterus. ENT:  Oropharynx clear without lesion.  Tongue normal. Mucous membranes moist.  RESPIRATORY:  Clear to auscultation without rales, wheezes or rhonchi. CARDIOVASCULAR:  Regular rate and rhythm without murmur, rub or gallop. ABDOMEN:  Soft, non-tender, with active bowel sounds, and no hepatosplenomegaly.  No masses. SKIN:  3 cm extremely tender and fluctuant abcess under left arm.  No rashes, ulcers or lesions. EXTREMITIES: No edema, no skin discoloration or tenderness.  No palpable cords. LYMPH NODES: No palpable cervical, supraclavicular, axillary or inguinal adenopathy  NEUROLOGICAL: Unremarkable. PSYCH:  Appropriate.  Appointment on 10/20/2014  Component Date Value Ref Range Status  . WBC 10/20/2014 8.3  3.6 - 11.0 K/uL Final  . RBC 10/20/2014 4.84  3.80 - 5.20 MIL/uL Final  . Hemoglobin 10/20/2014 13.1  12.0 - 16.0 g/dL Final  . HCT 10/20/2014 40.6  35.0 - 47.0 % Final  . MCV 10/20/2014 83.8  80.0 - 100.0 fL Final  . MCH 10/20/2014 27.2  26.0 - 34.0 pg Final  . MCHC 10/20/2014 32.4  32.0 - 36.0 g/dL Final  . RDW 10/20/2014 13.9  11.5 - 14.5 % Final  . Platelets 10/20/2014 212  150 - 440 K/uL Final  . Neutrophils Relative % 10/20/2014 65   Final  . Neutro Abs 10/20/2014 5.4  1.4 - 6.5 K/uL Final  . Lymphocytes Relative 10/20/2014 24   Final  . Lymphs Abs 10/20/2014 2.0  1.0 - 3.6 K/uL Final  . Monocytes Relative 10/20/2014 9   Final  . Monocytes Absolute 10/20/2014 0.7  0.2 - 0.9 K/uL Final  . Eosinophils Relative 10/20/2014 1   Final  . Eosinophils Absolute 10/20/2014 0.1  0 - 0.7 K/uL Final  . Basophils Relative 10/20/2014 1   Final  . Basophils Absolute 10/20/2014 0.1  0 - 0.1 K/uL  Final  . Sodium 10/20/2014 134* 135 - 145 mmol/L Final  . Potassium 10/20/2014 3.6  3.5 - 5.1 mmol/L Final  . Chloride 10/20/2014 104  101 - 111 mmol/L Final  . CO2 10/20/2014 21* 22 - 32 mmol/L Final  . Glucose, Bld 10/20/2014 154* 65 - 99 mg/dL Final  . BUN 10/20/2014 7  6 - 20 mg/dL Final  . Creatinine, Ser 10/20/2014 0.75  0.44 - 1.00 mg/dL Final  . Calcium 10/20/2014 8.6* 8.9 - 10.3 mg/dL Final  . Total Protein 10/20/2014 7.1  6.5 - 8.1 g/dL Final  . Albumin 10/20/2014 3.9  3.5 - 5.0 g/dL Final  . AST 10/20/2014 23  15 - 41 U/L Final  . ALT 10/20/2014 22  14 - 54 U/L  Final  . Alkaline Phosphatase 10/20/2014 91  38 - 126 U/L Final  . Total Bilirubin 10/20/2014 0.4  0.3 - 1.2 mg/dL Final  . GFR calc non Af Amer 10/20/2014 >60  >60 mL/min Final  . GFR calc Af Amer 10/20/2014 >60  >60 mL/min Final   Comment: (NOTE) The eGFR has been calculated using the CKD EPI equation. This calculation has not been validated in all clinical situations. eGFR's persistently <60 mL/min signify possible Chronic Kidney Disease.   . Anion gap 10/20/2014 9  5 - 15 Final  . Magnesium 10/20/2014 2.0  1.7 - 2.4 mg/dL Final   Assessment:  ZETHA KUHAR is an 54 y.o. African American female with stage IIA left breast cancer status post partial mastectomy and sentinel lymph node biopsy on 08/27/2014. 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 There was lymphovascular invasion. One of 2 sentinel lymph nodes were positive (focus of 2.8 mm). Tumor was > 90% ER positive, > 90% PR positive, and Her2/neu negative. Pathologic stage was T1bN1aM0.  Bone scan on 09/16/2014 revealed abnormal focal uptake at the level of right L3 pedicle and L5 vertebral body. Lumbar spine MRI on 09/27/2014 revealed no evidence of metastatic disease with lower lumbar facet arthritis. Abdominal and pelvic CT scan on 09/24/2014 revealed hepatomegaly and no evidence  of metastatic disease.   Bone density study on 09/15/2014 revealed a T-score of -2.1 at L1-L4 (osteopenia).  She is status post 1 cycle of Taxotere and Cytoxan (09/29/2014) with Neulasta support. Cycle #1 was complicated by Neulasta induced bone pain requiring pain medications.  Counts were good.  She has a 3 cm left axillary abscess.  She denies any fever.  Plan: 1. Labs today:  CBC with diff, CMP, Mg. 2. Postpone Cycle #2 Taxotere and Cytoxan. 3. Rx: Decadron pre and post chemotherapy for remaining 3 cycles. 4. Contact Dr Delice Lesch office regarding I&D of left axillary abcess- done. 5. RTC on 10/28/2014 for MD assessment, labs (CBC with diff, CMP, Mg), and cycle #2 Taxotere and Cytoxan.  Lequita Asal, MD  10/20/2014, 10:22 AM

## 2014-10-21 DIAGNOSIS — L732 Hidradenitis suppurativa: Secondary | ICD-10-CM | POA: Diagnosis not present

## 2014-10-22 ENCOUNTER — Telehealth: Payer: Self-pay | Admitting: *Deleted

## 2014-10-22 NOTE — Telephone Encounter (Signed)
States she has had the place under her arm taken care of and that doctor told her that she is ok to get her chemo, She states she needs her steroids called in for pre chemo since we are closed Monday

## 2014-10-22 NOTE — Telephone Encounter (Signed)
She had an I & D on two places under her arm by Dr Leanora Cover per phone call to his office. They will fax note to [place]

## 2014-10-23 ENCOUNTER — Other Ambulatory Visit: Payer: Self-pay | Admitting: Hematology and Oncology

## 2014-10-23 MED ORDER — DEXAMETHASONE 4 MG PO TABS: ORAL_TABLET | ORAL | Status: DC

## 2014-10-23 NOTE — Telephone Encounter (Signed)
Please let patient know prescription sent in. She will take steroids on the day before and after chemo. We will give her steroids IV on the day of treatment.  Her prescription is enough for 3 treatments.  M

## 2014-10-23 NOTE — Telephone Encounter (Signed)
Informed pt rx called in and to take steroids the day before and day after and we will give her the steroids IV the day of her treatment; pt verbalized understanding of this.

## 2014-10-28 ENCOUNTER — Encounter: Payer: Self-pay | Admitting: Hematology and Oncology

## 2014-10-28 ENCOUNTER — Inpatient Hospital Stay: Payer: Commercial Managed Care - HMO

## 2014-10-28 ENCOUNTER — Inpatient Hospital Stay (HOSPITAL_BASED_OUTPATIENT_CLINIC_OR_DEPARTMENT_OTHER): Payer: Commercial Managed Care - HMO | Admitting: Hematology and Oncology

## 2014-10-28 VITALS — BP 172/97 | HR 91 | Temp 98.0°F | Ht 68.0 in | Wt 216.5 lb

## 2014-10-28 VITALS — BP 158/74 | HR 68 | Temp 97.1°F

## 2014-10-28 DIAGNOSIS — Z418 Encounter for other procedures for purposes other than remedying health state: Secondary | ICD-10-CM

## 2014-10-28 DIAGNOSIS — C50912 Malignant neoplasm of unspecified site of left female breast: Secondary | ICD-10-CM

## 2014-10-28 DIAGNOSIS — Z170 Estrogen receptor positive status [ER+]: Secondary | ICD-10-CM | POA: Diagnosis not present

## 2014-10-28 DIAGNOSIS — R160 Hepatomegaly, not elsewhere classified: Secondary | ICD-10-CM | POA: Diagnosis not present

## 2014-10-28 DIAGNOSIS — I10 Essential (primary) hypertension: Secondary | ICD-10-CM | POA: Diagnosis not present

## 2014-10-28 DIAGNOSIS — M8580 Other specified disorders of bone density and structure, unspecified site: Secondary | ICD-10-CM | POA: Diagnosis not present

## 2014-10-28 DIAGNOSIS — Z79899 Other long term (current) drug therapy: Secondary | ICD-10-CM | POA: Diagnosis not present

## 2014-10-28 DIAGNOSIS — Z5111 Encounter for antineoplastic chemotherapy: Secondary | ICD-10-CM | POA: Diagnosis not present

## 2014-10-28 DIAGNOSIS — R7989 Other specified abnormal findings of blood chemistry: Secondary | ICD-10-CM | POA: Diagnosis not present

## 2014-10-28 DIAGNOSIS — Z5189 Encounter for other specified aftercare: Secondary | ICD-10-CM | POA: Diagnosis not present

## 2014-10-28 LAB — COMPREHENSIVE METABOLIC PANEL
ALT: 33 U/L (ref 14–54)
AST: 25 U/L (ref 15–41)
Albumin: 3.9 g/dL (ref 3.5–5.0)
Alkaline Phosphatase: 99 U/L (ref 38–126)
Anion gap: 10 (ref 5–15)
BUN: 11 mg/dL (ref 6–20)
CO2: 20 mmol/L — ABNORMAL LOW (ref 22–32)
Calcium: 9.3 mg/dL (ref 8.9–10.3)
Chloride: 109 mmol/L (ref 101–111)
Creatinine, Ser: 0.55 mg/dL (ref 0.44–1.00)
GFR calc Af Amer: >60 mL/min (ref >60)
GFR calc non Af Amer: >60 mL/min (ref >60)
Glucose, Bld: 166 mg/dL — ABNORMAL HIGH (ref 65–99)
Potassium: 3.9 mmol/L (ref 3.5–5.1)
Sodium: 139 mmol/L (ref 135–145)
Total Bilirubin: 0.3 mg/dL (ref 0.3–1.2)
Total Protein: 7.7 g/dL (ref 6.5–8.1)

## 2014-10-28 LAB — CBC WITH DIFFERENTIAL/PLATELET
Basophils Absolute: 0 10*3/uL (ref 0–0.1)
Basophils Relative: 0 %
Eosinophils Absolute: 0 10*3/uL (ref 0–0.7)
Eosinophils Relative: 0 %
HCT: 41.4 % (ref 35.0–47.0)
Hemoglobin: 13.2 g/dL (ref 12.0–16.0)
Lymphocytes Relative: 8 %
Lymphs Abs: 1.5 10*3/uL (ref 1.0–3.6)
MCH: 26.3 pg (ref 26.0–34.0)
MCHC: 31.9 g/dL — ABNORMAL LOW (ref 32.0–36.0)
MCV: 82.5 fL (ref 80.0–100.0)
Monocytes Absolute: 0.7 10*3/uL (ref 0.2–0.9)
Monocytes Relative: 3 %
Neutro Abs: 16.7 10*3/uL — ABNORMAL HIGH (ref 1.4–6.5)
Neutrophils Relative %: 89 %
Platelets: 288 10*3/uL (ref 150–440)
RBC: 5.01 MIL/uL (ref 3.80–5.20)
RDW: 13.7 % (ref 11.5–14.5)
WBC: 19 10*3/uL — ABNORMAL HIGH (ref 3.6–11.0)

## 2014-10-28 LAB — MAGNESIUM: Magnesium: 2.2 mg/dL (ref 1.7–2.4)

## 2014-10-28 MED ORDER — FAMOTIDINE IN NACL 20-0.9 MG/50ML-% IV SOLN
20.0000 mg | Freq: Once | INTRAVENOUS | Status: AC | PRN
  Administered 2014-10-28: 20 mg via INTRAVENOUS
  Filled 2014-10-28: qty 50

## 2014-10-28 MED ORDER — DEXTROSE 5 % IV SOLN
75.0000 mg/m2 | Freq: Once | INTRAVENOUS | Status: AC
  Administered 2014-10-28: 160 mg via INTRAVENOUS
  Filled 2014-10-28: qty 16

## 2014-10-28 MED ORDER — OXYCODONE-ACETAMINOPHEN 5-325 MG PO TABS: 1.0000 | ORAL_TABLET | Freq: Four times a day (QID) | ORAL | Status: DC | PRN

## 2014-10-28 MED ORDER — SODIUM CHLORIDE 0.9 % IJ SOLN
10.0000 mL | INTRAMUSCULAR | Status: DC | PRN
Start: 2014-10-28 — End: 2014-10-28
  Filled 2014-10-28: qty 10

## 2014-10-28 MED ORDER — SODIUM CHLORIDE 0.9 % IV SOLN
Freq: Once | INTRAVENOUS | Status: AC
Start: 2014-10-28 — End: 2014-10-28
  Administered 2014-10-28: 12:00:00 via INTRAVENOUS
  Filled 2014-10-28: qty 1000

## 2014-10-28 MED ORDER — SODIUM CHLORIDE 0.9 % IV SOLN
Freq: Once | INTRAVENOUS | Status: AC
  Administered 2014-10-28: 12:00:00 via INTRAVENOUS
  Filled 2014-10-28: qty 5

## 2014-10-28 MED ORDER — DIPHENHYDRAMINE HCL 50 MG/ML IJ SOLN
25.0000 mg | Freq: Once | INTRAMUSCULAR | Status: AC | PRN
  Administered 2014-10-28: 25 mg via INTRAVENOUS
  Filled 2014-10-28: qty 1

## 2014-10-28 MED ORDER — PALONOSETRON HCL INJECTION 0.25 MG/5ML
0.2500 mg | Freq: Once | INTRAVENOUS | Status: AC
  Administered 2014-10-28: 0.25 mg via INTRAVENOUS
  Filled 2014-10-28: qty 5

## 2014-10-28 MED ORDER — SODIUM CHLORIDE 0.9 % IV SOLN
600.0000 mg/m2 | Freq: Once | INTRAVENOUS | Status: AC
  Administered 2014-10-28: 1300 mg via INTRAVENOUS
  Filled 2014-10-28: qty 50

## 2014-10-28 NOTE — Progress Notes (Signed)
[facility] day:  10/28/2014  Chief Complaint: Donna [facility] is an 54 y.o. female with stage IIA left breast cancer who is seen for assessment prior to cycle #2 Taxotere and Cytoxan.  HPI: The patient was last seen in the medical oncology clinic on 10/20/2014.  At that time, cycle #2 was postponed secondary to a 3 cm left axillary abscess.  She was felt to have hidradenitis suppurativa requiring incision and drainage prior to her chemotherapy. She was referred to Dr. Leanora Cover.    She states that she underwent incision and drainage on the following day (10/21/2014).  She has continued her antibiotics.  The site has healed well. She denies any fevers. She states that her left breast sometimes has some drainage from the nipple.  Past Medical History  Diagnosis Date  . Prediabetes   . Elevated LFTs   . Positive PPD, treated   . Hypertension   . Arthritis   . Breast cancer     left   Past Surgical History  Procedure Laterality Date  . Cholecystectomy    . Tubal ligation Bilateral   . Breast lumpectomy with needle localization and axillary sentinel lymph node bx Left   . Breast biopsy Left    Family History  Problem Relation Age of Onset  . Cancer Mother     Pancreatic  . Cancer Father     Throat  . Cancer Maternal Grandmother     Breast    Social History:  reports that she has been smoking Cigarettes.  She has a 57 pack-year smoking history. She does not have any smokeless tobacco history on file. She reports that she drinks alcohol. She reports that she does not use illicit drugs.  The patient is accompanied by her son, Richardson Landry.  Allergies: No Known Allergies  Current Medications: Current Outpatient Prescriptions  Medication Sig Dispense Refill  . amoxicillin-clavulanate (AUGMENTIN) 875-125 MG per tablet Take 1 tablet by mouth 2 (two) times daily.    Marland Kitchen BAYER BREEZE 2 TEST DISK     . dexamethasone (DECADRON) 4 MG tablet Take 2  tablets by mouth twice a day on the day before and the day after chemotherapy 24 tablet 0  . HYDROcodone-acetaminophen (NORCO/VICODIN) 5-325 MG per tablet Take 1-2 tablets by mouth every 4 (four) hours as needed for moderate pain.    Marland Kitchen lisinopril (PRINIVIL,ZESTRIL) 20 MG tablet Take 20 mg by mouth daily.    . metFORMIN (GLUCOPHAGE) 500 MG tablet Take 500 mg by mouth daily.    Marland Kitchen MICROLET LANCETS MISC     . ondansetron (ZOFRAN) 8 MG tablet Take 1 tablet (8 mg total) by mouth every 8 (eight) hours as needed for nausea or vomiting. 20 tablet 0  . oxyCODONE-acetaminophen (PERCOCET/ROXICET) 5-325 MG per tablet Take 1 tablet by mouth every 6 (six) hours as needed for moderate pain or severe pain. 20 tablet 0   No current facility-administered medications for this visit.   Review of Systems:  GENERAL:  Feels good.  Active.  No fevers, sweats or weight loss. PERFORMANCE STATUS (ECOG):  1 HEENT:  No visual changes, runny nose, sore throat, mouth sores or tenderness. Lungs: No shortness of breath or cough.  No hemoptysis. Cardiac:  No chest pain, palpitations, orthopnea, or PND. BREAST:  Sometimes drainage from left nipple. GI:  No nausea, vomiting, diarrhea, constipation, melena or hematochezia. GU:  No urgency, frequency, dysuria, or hematuria. Musculoskeletal:  No back pain.  No joint  pain.  No muscle tenderness. Extremities:  No pain or swelling. Skin:  Left axillary area healed. Neuro:  No headache, numbness or weakness, balance or coordination issues. Endocrine:  No diabetes, thyroid issues, hot flashes or night sweats. Psych:  No mood changes, depression or anxiety. Pain:  No focal pain. Review of systems:  All other systems reviewed and found to be negative.   Physical Exam: Height 172.2 cm,  98.2 kg,  BSA 2.17 m2,  pulse 91,  blood pressure 172/97,  Temp 98.  GENERAL:  Well developed, well nourished, sitting comfortably in the exam room in no acute distress. MENTAL STATUS:  Alert and  oriented to person, place and time. HEAD:  Long brown wig.  Normocephalic, atraumatic, face symmetric, no Cushingoid features. EYES:  Brown eyes.  Pupils equal round and reactive to light and accomodation.  No conjunctivitis or scleral icterus. ENT:  Oropharynx clear without lesion.  Tongue normal. Mucous membranes moist.  RESPIRATORY:  Clear to auscultation without rales, wheezes or rhonchi. CARDIOVASCULAR:  Regular rate and rhythm without murmur, rub or gallop. BREAST: Left breast without masses, skin changes or nipple discharge.  No nipple discharge. ABDOMEN:  Soft, non-tender, with active bowel sounds, and no hepatosplenomegaly.  No masses. SKIN:  Left axillae well healed.  No erythema or induration.  No rashes, ulcers or lesions. EXTREMITIES: No edema, no skin discoloration or tenderness.  No palpable cords. LYMPH NODES: No palpable cervical, supraclavicular, axillary or inguinal adenopathy  NEUROLOGICAL: Unremarkable. PSYCH:  Appropriate.   Appointment on 10/28/2014  Component Date Value Ref Range Status  . WBC 10/28/2014 19.0* 3.6 - 11.0 K/uL Final  . RBC 10/28/2014 5.01  3.80 - 5.20 MIL/uL Final  . Hemoglobin 10/28/2014 13.2  12.0 - 16.0 g/dL Final  . HCT 10/28/2014 41.4  35.0 - 47.0 % Final  . MCV 10/28/2014 82.5  80.0 - 100.0 fL Final  . MCH 10/28/2014 26.3  26.0 - 34.0 pg Final  . MCHC 10/28/2014 31.9* 32.0 - 36.0 g/dL Final  . RDW 10/28/2014 13.7  11.5 - 14.5 % Final  . Platelets 10/28/2014 288  150 - 440 K/uL Final  . Neutrophils Relative % 10/28/2014 89   Final  . Neutro Abs 10/28/2014 16.7* 1.4 - 6.5 K/uL Final  . Lymphocytes Relative 10/28/2014 8   Final  . Lymphs Abs 10/28/2014 1.5  1.0 - 3.6 K/uL Final  . Monocytes Relative 10/28/2014 3   Final  . Monocytes Absolute 10/28/2014 0.7  0.2 - 0.9 K/uL Final  . Eosinophils Relative 10/28/2014 0   Final  . Eosinophils Absolute 10/28/2014 0.0  0 - 0.7 K/uL Final  . Basophils Relative 10/28/2014 0   Final  . Basophils  Absolute 10/28/2014 0.0  0 - 0.1 K/uL Final  . Sodium 10/28/2014 139  135 - 145 mmol/L Final  . Potassium 10/28/2014 3.9  3.5 - 5.1 mmol/L Final  . Chloride 10/28/2014 109  101 - 111 mmol/L Final  . CO2 10/28/2014 20* 22 - 32 mmol/L Final  . Glucose, Bld 10/28/2014 166* 65 - 99 mg/dL Final  . BUN 10/28/2014 11  6 - 20 mg/dL Final  . Creatinine, Ser 10/28/2014 0.55  0.44 - 1.00 mg/dL Final  . Calcium 10/28/2014 9.3  8.9 - 10.3 mg/dL Final  . Total Protein 10/28/2014 7.7  6.5 - 8.1 g/dL Final  . Albumin 10/28/2014 3.9  3.5 - 5.0 g/dL Final  . AST 10/28/2014 25  15 - 41 U/L Final  . ALT 10/28/2014 33  14 - 54 U/L Final  . Alkaline Phosphatase 10/28/2014 99  38 - 126 U/L Final  . Total Bilirubin 10/28/2014 0.3  0.3 - 1.2 mg/dL Final  . GFR calc non Af Amer 10/28/2014 >60  >60 mL/min Final  . GFR calc Af Amer 10/28/2014 >60  >60 mL/min Final   Comment: (NOTE) The eGFR has been calculated using the CKD EPI equation. This calculation has not been validated in all clinical situations. eGFR's persistently <60 mL/min signify possible Chronic Kidney Disease.   . Anion gap 10/28/2014 10  5 - 15 Final  . Magnesium 10/28/2014 2.2  1.7 - 2.4 mg/dL Final   Assessment:  CHANTE MAYSON is a 54 y.o. African American female with stage IIA left breast cancer status post partial mastectomy and sentinel lymph node biopsy on 08/27/2014. 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 There was lymphovascular invasion. One of 2 sentinel lymph nodes were positive (focus of 2.8 mm). Tumor was > 90% ER positive, > 90% PR positive, and Her2/neu negative. Pathologic stage was T1bN1aM0.  Bone scan on 09/16/2014 revealed abnormal focal uptake at the level of right L3 pedicle and L5 vertebral body. Lumbar spine MRI on 09/27/2014 revealed no evidence of metastatic disease with lower lumbar facet arthritis. Abdominal and pelvic CT scan on 09/24/2014  revealed hepatomegaly and no evidence of metastatic disease.   Bone density study on 09/15/2014 revealed a T-score of -2.1 at L1-L4 (osteopenia).  She is status post 1 cycle of Taxotere and Cytoxan (09/29/2014) with Neulasta support. Cycle #1 was complicated by Neulasta induced bone pain requiring pain medications. She developed a 3 cm left axillary abcess (hidradenitis suppurativa) requiring incision and drainage on 10/21/2014.  Symptomatically, she is doing well. Left axillary region has healed. She notes intermittent drainage from her left nipple. Exam reveals no abnormality.  Plan: 1. Labs today:  CBC with diff, CMP, Mg. 2. Cycle #2 TC today. 3. Neulasta tomorrow.  Discuss dose reduction with pharmacy. 4. Remind patient to take Claritin for Neulasta bone pain. 5. Rx:  Percocet 5/325. 6. RTC in 3 weeks for MD assessment, labs (CBC, CMP, Mg) and cycle #3 TC.  Lequita Asal, MD  10/28/2014, 5:25 PM

## 2014-10-29 ENCOUNTER — Inpatient Hospital Stay: Payer: Commercial Managed Care - HMO | Attending: Hematology and Oncology

## 2014-10-29 ENCOUNTER — Ambulatory Visit: Payer: Commercial Managed Care - HMO

## 2014-10-29 VITALS — BP 159/78 | HR 74 | Temp 98.3°F | Resp 18

## 2014-10-29 DIAGNOSIS — Z5111 Encounter for antineoplastic chemotherapy: Secondary | ICD-10-CM | POA: Insufficient documentation

## 2014-10-29 DIAGNOSIS — F17210 Nicotine dependence, cigarettes, uncomplicated: Secondary | ICD-10-CM | POA: Insufficient documentation

## 2014-10-29 DIAGNOSIS — Z418 Encounter for other procedures for purposes other than remedying health state: Secondary | ICD-10-CM | POA: Insufficient documentation

## 2014-10-29 DIAGNOSIS — Z170 Estrogen receptor positive status [ER+]: Secondary | ICD-10-CM | POA: Diagnosis not present

## 2014-10-29 DIAGNOSIS — R160 Hepatomegaly, not elsewhere classified: Secondary | ICD-10-CM | POA: Diagnosis not present

## 2014-10-29 DIAGNOSIS — M8580 Other specified disorders of bone density and structure, unspecified site: Secondary | ICD-10-CM | POA: Diagnosis not present

## 2014-10-29 DIAGNOSIS — C50912 Malignant neoplasm of unspecified site of left female breast: Secondary | ICD-10-CM

## 2014-10-29 DIAGNOSIS — I10 Essential (primary) hypertension: Secondary | ICD-10-CM | POA: Insufficient documentation

## 2014-10-29 DIAGNOSIS — Z79899 Other long term (current) drug therapy: Secondary | ICD-10-CM | POA: Insufficient documentation

## 2014-10-29 MED ORDER — PEGFILGRASTIM INJECTION 6 MG/0.6ML ~~LOC~~
6.0000 mg | PREFILLED_SYRINGE | Freq: Once | SUBCUTANEOUS | Status: AC
Start: 2014-10-29 — End: 2014-10-29
  Administered 2014-10-29: 6 mg via SUBCUTANEOUS
  Filled 2014-10-29: qty 0.6

## 2014-11-02 ENCOUNTER — Encounter: Payer: Self-pay | Admitting: Hematology and Oncology

## 2014-11-02 ENCOUNTER — Telehealth: Payer: Self-pay | Admitting: Hematology and Oncology

## 2014-11-02 NOTE — Telephone Encounter (Signed)
Re:  Neulasta  Today spoke with the patient on the phone regarding my conversation with pharmacy. We discussed her Neulasta induced bone pain with cycle #1. It was not felt that a decreased dose of Neulasta would be beneficial (less bone pain) after talking with our pharmacist.   We discussed her counts prior to treatment. Her white count was elevated likely due to Decadron.   We discussed no Neulasta and follow-up counts in 10 days. She has had a recent axillary infection for which she underwent incision and drainage. She is on antibiotics.   We discussed postponing Neulasta for 48 hours after chemotherapy to hopefully decrease Neulasta induced bone pain.   The patient stated that she would take her Neulasta as scheduled. She was encouraged to take her Claritin.  She has a prescription for Percocet.  Lequita Asal, MD

## 2014-11-18 ENCOUNTER — Other Ambulatory Visit: Payer: Self-pay | Admitting: *Deleted

## 2014-11-18 ENCOUNTER — Encounter: Payer: Self-pay | Admitting: Hematology and Oncology

## 2014-11-18 ENCOUNTER — Inpatient Hospital Stay (HOSPITAL_BASED_OUTPATIENT_CLINIC_OR_DEPARTMENT_OTHER): Payer: Commercial Managed Care - HMO | Admitting: Hematology and Oncology

## 2014-11-18 ENCOUNTER — Other Ambulatory Visit: Payer: Commercial Managed Care - HMO

## 2014-11-18 ENCOUNTER — Ambulatory Visit: Payer: Commercial Managed Care - HMO

## 2014-11-18 ENCOUNTER — Inpatient Hospital Stay: Payer: Commercial Managed Care - HMO

## 2014-11-18 ENCOUNTER — Ambulatory Visit: Payer: Commercial Managed Care - HMO | Admitting: Hematology and Oncology

## 2014-11-18 VITALS — BP 135/75 | HR 69 | Temp 96.1°F

## 2014-11-18 VITALS — BP 174/88 | HR 75 | Temp 97.9°F | Resp 16 | Wt 215.6 lb

## 2014-11-18 DIAGNOSIS — C50912 Malignant neoplasm of unspecified site of left female breast: Secondary | ICD-10-CM

## 2014-11-18 DIAGNOSIS — Z170 Estrogen receptor positive status [ER+]: Secondary | ICD-10-CM | POA: Diagnosis not present

## 2014-11-18 DIAGNOSIS — Z79899 Other long term (current) drug therapy: Secondary | ICD-10-CM

## 2014-11-18 DIAGNOSIS — F17210 Nicotine dependence, cigarettes, uncomplicated: Secondary | ICD-10-CM | POA: Diagnosis not present

## 2014-11-18 DIAGNOSIS — Z5111 Encounter for antineoplastic chemotherapy: Secondary | ICD-10-CM | POA: Diagnosis not present

## 2014-11-18 DIAGNOSIS — M8580 Other specified disorders of bone density and structure, unspecified site: Secondary | ICD-10-CM

## 2014-11-18 DIAGNOSIS — Z418 Encounter for other procedures for purposes other than remedying health state: Secondary | ICD-10-CM | POA: Diagnosis not present

## 2014-11-18 DIAGNOSIS — R160 Hepatomegaly, not elsewhere classified: Secondary | ICD-10-CM | POA: Diagnosis not present

## 2014-11-18 DIAGNOSIS — I10 Essential (primary) hypertension: Secondary | ICD-10-CM

## 2014-11-18 LAB — CBC WITH DIFFERENTIAL/PLATELET
Basophils Absolute: 0 10*3/uL (ref 0–0.1)
Basophils Relative: 0 %
Eosinophils Absolute: 0 10*3/uL (ref 0–0.7)
Eosinophils Relative: 0 %
HCT: 40.2 % (ref 35.0–47.0)
Hemoglobin: 12.8 g/dL (ref 12.0–16.0)
Lymphocytes Relative: 8 %
Lymphs Abs: 1.1 10*3/uL (ref 1.0–3.6)
MCH: 26.8 pg (ref 26.0–34.0)
MCHC: 32 g/dL (ref 32.0–36.0)
MCV: 83.7 fL (ref 80.0–100.0)
Monocytes Absolute: 0.4 10*3/uL (ref 0.2–0.9)
Monocytes Relative: 3 %
Neutro Abs: 12.7 10*3/uL — ABNORMAL HIGH (ref 1.4–6.5)
Neutrophils Relative %: 89 %
Platelets: 268 10*3/uL (ref 150–440)
RBC: 4.8 MIL/uL (ref 3.80–5.20)
RDW: 14.8 % — ABNORMAL HIGH (ref 11.5–14.5)
WBC: 14.3 10*3/uL — ABNORMAL HIGH (ref 3.6–11.0)

## 2014-11-18 LAB — COMPREHENSIVE METABOLIC PANEL
ALT: 30 U/L (ref 14–54)
AST: 26 U/L (ref 15–41)
Albumin: 4 g/dL (ref 3.5–5.0)
Alkaline Phosphatase: 98 U/L (ref 38–126)
Anion gap: 11 (ref 5–15)
BUN: 10 mg/dL (ref 6–20)
CO2: 19 mmol/L — ABNORMAL LOW (ref 22–32)
Calcium: 9.1 mg/dL (ref 8.9–10.3)
Chloride: 102 mmol/L (ref 101–111)
Creatinine, Ser: 0.75 mg/dL (ref 0.44–1.00)
GFR calc Af Amer: >60 mL/min (ref >60)
GFR calc non Af Amer: >60 mL/min (ref >60)
Glucose, Bld: 202 mg/dL — ABNORMAL HIGH (ref 65–99)
Potassium: 3.9 mmol/L (ref 3.5–5.1)
Sodium: 132 mmol/L — ABNORMAL LOW (ref 135–145)
Total Bilirubin: 0.3 mg/dL (ref 0.3–1.2)
Total Protein: 7.6 g/dL (ref 6.5–8.1)

## 2014-11-18 LAB — MAGNESIUM: Magnesium: 1.9 mg/dL (ref 1.7–2.4)

## 2014-11-18 MED ORDER — HYDROCODONE-ACETAMINOPHEN 5-325 MG PO TABS: 1.0000 | ORAL_TABLET | ORAL | Status: DC | PRN

## 2014-11-18 MED ORDER — SODIUM CHLORIDE 0.9 % IV SOLN
Freq: Once | INTRAVENOUS | Status: AC
  Administered 2014-11-18: 11:00:00 via INTRAVENOUS
  Filled 2014-11-18: qty 1000

## 2014-11-18 MED ORDER — OXYCODONE-ACETAMINOPHEN 5-325 MG PO TABS: 1.0000 | ORAL_TABLET | Freq: Four times a day (QID) | ORAL | Status: DC | PRN

## 2014-11-18 MED ORDER — FAMOTIDINE IN NACL 20-0.9 MG/50ML-% IV SOLN
20.0000 mg | Freq: Once | INTRAVENOUS | Status: AC | PRN
  Administered 2014-11-18: 20 mg via INTRAVENOUS
  Filled 2014-11-18: qty 50

## 2014-11-18 MED ORDER — PALONOSETRON HCL INJECTION 0.25 MG/5ML
0.2500 mg | Freq: Once | INTRAVENOUS | Status: AC
  Administered 2014-11-18: 0.25 mg via INTRAVENOUS
  Filled 2014-11-18: qty 5

## 2014-11-18 MED ORDER — DOCETAXEL CHEMO INJECTION 160 MG/16ML
75.0000 mg/m2 | Freq: Once | INTRAVENOUS | Status: AC
  Administered 2014-11-18: 160 mg via INTRAVENOUS
  Filled 2014-11-18: qty 16

## 2014-11-18 MED ORDER — FOSAPREPITANT DIMEGLUMINE INJECTION 150 MG
Freq: Once | INTRAVENOUS | Status: AC
  Administered 2014-11-18: 11:00:00 via INTRAVENOUS
  Filled 2014-11-18: qty 5

## 2014-11-18 MED ORDER — DIPHENHYDRAMINE HCL 50 MG/ML IJ SOLN
25.0000 mg | Freq: Once | INTRAMUSCULAR | Status: AC | PRN
Start: 2014-11-18 — End: 2014-11-18
  Administered 2014-11-18: 25 mg via INTRAVENOUS
  Filled 2014-11-18: qty 1

## 2014-11-18 MED ORDER — SODIUM CHLORIDE 0.9 % IV SOLN
600.0000 mg/m2 | Freq: Once | INTRAVENOUS | Status: AC
  Administered 2014-11-18: 1300 mg via INTRAVENOUS
  Filled 2014-11-18: qty 65

## 2014-11-18 NOTE — Progress Notes (Signed)
[facility] day:  11/18/2014  Chief Complaint: Donna Riley is a 54 y.o. female with stage IIA left breast cancer who is seen for assessment prior to cycle #3 Taxotere and Cytoxan.  HPI: The patient was last seen in the medical oncology clinic on 10/28/2014.  At that time, she was doing well.  Her left axillary abscess had healed s/p incision and drainage.  She received cycle #2 TC uneventfully.  Symptomatically, she is doing well.  She notes a slight neuropathy that is not painful and does not affect function. She states that she ran out of her pain medications that she uses for Neulasta induced bone pain. Typically, she will take 4 a day.  Past Medical History  Diagnosis Date  . Prediabetes   . Elevated LFTs   . Positive PPD, treated   . Hypertension   . Arthritis   . Breast cancer     left    Past Surgical History  Procedure Laterality Date  . Cholecystectomy    . Tubal ligation Bilateral   . Breast lumpectomy with needle localization and axillary sentinel lymph node bx Left   . Breast biopsy Left     Family History  Problem Relation Age of Onset  . Cancer Mother     Pancreatic  . Cancer Father     Throat  . Cancer Maternal Grandmother     Breast    Social History:  reports that she has been smoking Cigarettes.  She has a 57 pack-year smoking history. She does not have any smokeless tobacco history on file. She reports that she drinks alcohol. She reports that she does not use illicit drugs.  The patient is accompanied by her husband today.  Allergies: No Known Allergies  Current Medications: Current Outpatient Prescriptions  Medication Sig Dispense Refill  . dexamethasone (DECADRON) 4 MG tablet Take 2 tablets by mouth twice a day on the day before and the day after chemotherapy 24 tablet 0  . lisinopril (PRINIVIL,ZESTRIL) 20 MG tablet Take 20 mg by mouth daily.    . metFORMIN (GLUCOPHAGE) 500 MG tablet Take 500 mg by  mouth daily.    Marland Kitchen MICROLET LANCETS MISC     . ondansetron (ZOFRAN) 8 MG tablet Take 1 tablet (8 mg total) by mouth every 8 (eight) hours as needed for nausea or vomiting. 20 tablet 0  . amoxicillin-clavulanate (AUGMENTIN) 875-125 MG per tablet Take 1 tablet by mouth 2 (two) times daily.    Marland Kitchen BAYER BREEZE 2 TEST DISK     . HYDROcodone-acetaminophen (NORCO/VICODIN) 5-325 MG per tablet Take 1-2 tablets by mouth every 4 (four) hours as needed for moderate pain. 40 tablet 0  . oxyCODONE-acetaminophen (PERCOCET/ROXICET) 5-325 MG per tablet Take 1 tablet by mouth every 6 (six) hours as needed for moderate pain or severe pain. 40 tablet 0   No current facility-administered medications for this visit.    Review of Systems:  GENERAL: Feels good. Active. No fevers, sweats or weight loss. PERFORMANCE STATUS (ECOG): 1 HEENT: No visual changes, runny nose, sore throat, mouth sores or tenderness. Lungs: No shortness of breath or cough. No hemoptysis. Cardiac: No chest pain, palpitations, orthopnea, or PND. BREAST: Sometimes drainage from left nipple. GI: No nausea, vomiting, diarrhea, constipation, melena or hematochezia. GU: No urgency, frequency, dysuria, or hematuria. Musculoskeletal: No back pain. No joint pain. No muscle tenderness. Extremities: No pain or swelling. Skin: Left axillary area healed. Neuro: Slight neuropathy.  No  headache, numbness or weakness, balance or coordination issues. Endocrine: No diabetes, thyroid issues, hot flashes or night sweats. Psych: No mood changes, depression or anxiety. Pain: No focal pain. Review of systems: All other systems reviewed and found to be negative.  Physical Exam: Blood pressure 174/88, pulse 75, temperature 97.9 F (36.6 C), temperature source Tympanic, resp. rate 16, weight 215 lb 9.8 oz (97.8 kg), SpO2 97 %. GENERAL: Well developed, well nourished, sitting comfortably in the exam room in no acute distress. MENTAL STATUS:  Alert and oriented to person, place and time. HEAD: Long brown wig. Normocephalic, atraumatic, face symmetric, no Cushingoid features. EYES: Brown eyes. Pupils equal round and reactive to light and accomodation. No conjunctivitis or scleral icterus. ENT: Oropharynx clear without lesion. Tongue normal. Mucous membranes moist.  RESPIRATORY: Clear to auscultation without rales, wheezes or rhonchi. CARDIOVASCULAR: Regular rate and rhythm without murmur, rub or gallop. ABDOMEN: Soft, non-tender, with active bowel sounds, and no hepatosplenomegaly. No masses. SKIN: Left axillae well healed. No erythema or induration. No rashes, ulcers or lesions. EXTREMITIES: No edema, no skin discoloration or tenderness. No palpable cords. LYMPH NODES: No palpable cervical, supraclavicular, axillary or inguinal adenopathy  NEUROLOGICAL: Unremarkable.  Bilateral patellar reflexes 2+. PSYCH: Appropriate.  Appointment on 11/18/2014  Component Date Value Ref Range Status  . WBC 11/18/2014 14.3* 3.6 - 11.0 K/uL Final  . RBC 11/18/2014 4.80  3.80 - 5.20 MIL/uL Final  . Hemoglobin 11/18/2014 12.8  12.0 - 16.0 g/dL Final  . HCT 11/18/2014 40.2  35.0 - 47.0 % Final  . MCV 11/18/2014 83.7  80.0 - 100.0 fL Final  . MCH 11/18/2014 26.8  26.0 - 34.0 pg Final  . MCHC 11/18/2014 32.0  32.0 - 36.0 g/dL Final  . RDW 11/18/2014 14.8* 11.5 - 14.5 % Final  . Platelets 11/18/2014 268  150 - 440 K/uL Final  . Neutrophils Relative % 11/18/2014 89   Final  . Neutro Abs 11/18/2014 12.7* 1.4 - 6.5 K/uL Final  . Lymphocytes Relative 11/18/2014 8   Final  . Lymphs Abs 11/18/2014 1.1  1.0 - 3.6 K/uL Final  . Monocytes Relative 11/18/2014 3   Final  . Monocytes Absolute 11/18/2014 0.4  0.2 - 0.9 K/uL Final  . Eosinophils Relative 11/18/2014 0   Final  . Eosinophils Absolute 11/18/2014 0.0  0 - 0.7 K/uL Final  . Basophils Relative 11/18/2014 0   Final  . Basophils Absolute 11/18/2014 0.0  0 - 0.1 K/uL Final  . Sodium  11/18/2014 132* 135 - 145 mmol/L Final  . Potassium 11/18/2014 3.9  3.5 - 5.1 mmol/L Final  . Chloride 11/18/2014 102  101 - 111 mmol/L Final  . CO2 11/18/2014 19* 22 - 32 mmol/L Final  . Glucose, Bld 11/18/2014 202* 65 - 99 mg/dL Final  . BUN 11/18/2014 10  6 - 20 mg/dL Final  . Creatinine, Ser 11/18/2014 0.75  0.44 - 1.00 mg/dL Final  . Calcium 11/18/2014 9.1  8.9 - 10.3 mg/dL Final  . Total Protein 11/18/2014 7.6  6.5 - 8.1 g/dL Final  . Albumin 11/18/2014 4.0  3.5 - 5.0 g/dL Final  . AST 11/18/2014 26  15 - 41 U/L Final  . ALT 11/18/2014 30  14 - 54 U/L Final  . Alkaline Phosphatase 11/18/2014 98  38 - 126 U/L Final  . Total Bilirubin 11/18/2014 0.3  0.3 - 1.2 mg/dL Final  . GFR calc non Af Amer 11/18/2014 >60  >60 mL/min Final  . GFR calc Af Wyvonnia Lora  11/18/2014 >60  >60 mL/min Final   Comment: (NOTE) The eGFR has been calculated using the CKD EPI equation. This calculation has not been validated in all clinical situations. eGFR's persistently <60 mL/min signify possible Chronic Kidney Disease.   . Anion gap 11/18/2014 11  5 - 15 Final  . Magnesium 11/18/2014 1.9  1.7 - 2.4 mg/dL Final    Assessment:  Donna Riley is a 54 y.o. African American female with stage IIA left breast cancer status post partial mastectomy and sentinel lymph node biopsy on 08/27/2014. 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 There was lymphovascular invasion. One of 2 sentinel lymph nodes were positive (focus of 2.8 mm). Tumor was > 90% ER positive, > 90% PR positive, and Her2/neu negative. Pathologic stage was T1bN1aM0.  Bone scan on 09/16/2014 revealed abnormal focal uptake at the level of right L3 pedicle and L5 vertebral body. Lumbar spine MRI on 09/27/2014 revealed no evidence of metastatic disease with lower lumbar facet arthritis. Abdominal and pelvic CT scan on 09/24/2014 revealed hepatomegaly and no evidence of metastatic  disease.   Bone density study on 09/15/2014 revealed a T-score of -2.1 at L1-L4 (osteopenia).  She is status post 2 cycles of Taxotere and Cytoxan (09/29/2014 - 10/28/2014) with Neulasta support. Cycle #1 was complicated by Neulasta induced bone pain requiring pain medications. She underwent I&D of a left axillary abcess (hidradenitis suppurativa) on 10/21/2014.   Symptomatically, she is doing well.  She notes a grade I/II neuropathy which is not affecting function.  Exam is normal.  Plan: 1.  Labs today: CBC with diff, CMP, Mg. 2.  Cycle #3 TC today. 3.  RTC tomorrow for Neulasta. 4.  Refill Percocet 5/325; Dis #40. 5.  RTC in 3 weeks for MD assessment, labs (CBC with diff, CMP, Mg), and cycle #4 TC.   Lequita Asal, MD  11/18/2014, 8:38 AM

## 2014-11-19 ENCOUNTER — Inpatient Hospital Stay: Payer: Commercial Managed Care - HMO

## 2014-11-19 VITALS — BP 179/80 | HR 70 | Resp 20

## 2014-11-19 DIAGNOSIS — Z79899 Other long term (current) drug therapy: Secondary | ICD-10-CM | POA: Diagnosis not present

## 2014-11-19 DIAGNOSIS — C50912 Malignant neoplasm of unspecified site of left female breast: Secondary | ICD-10-CM | POA: Diagnosis not present

## 2014-11-19 DIAGNOSIS — Z5111 Encounter for antineoplastic chemotherapy: Secondary | ICD-10-CM | POA: Diagnosis not present

## 2014-11-19 DIAGNOSIS — R160 Hepatomegaly, not elsewhere classified: Secondary | ICD-10-CM | POA: Diagnosis not present

## 2014-11-19 DIAGNOSIS — M8580 Other specified disorders of bone density and structure, unspecified site: Secondary | ICD-10-CM | POA: Diagnosis not present

## 2014-11-19 DIAGNOSIS — I10 Essential (primary) hypertension: Secondary | ICD-10-CM | POA: Diagnosis not present

## 2014-11-19 DIAGNOSIS — Z170 Estrogen receptor positive status [ER+]: Secondary | ICD-10-CM | POA: Diagnosis not present

## 2014-11-19 DIAGNOSIS — Z418 Encounter for other procedures for purposes other than remedying health state: Secondary | ICD-10-CM | POA: Diagnosis not present

## 2014-11-19 DIAGNOSIS — F17210 Nicotine dependence, cigarettes, uncomplicated: Secondary | ICD-10-CM | POA: Diagnosis not present

## 2014-11-19 MED ORDER — PEGFILGRASTIM INJECTION 6 MG/0.6ML ~~LOC~~
6.0000 mg | PREFILLED_SYRINGE | Freq: Once | SUBCUTANEOUS | Status: AC
  Administered 2014-11-19: 6 mg via SUBCUTANEOUS
  Filled 2014-11-19: qty 0.6

## 2014-11-24 ENCOUNTER — Telehealth: Payer: Self-pay | Admitting: *Deleted

## 2014-11-24 NOTE — Telephone Encounter (Signed)
After discussing with Dr Mike Gip, pt advised to contact PMD. She was upset statin gthat she didn't have any problems until she started chemo, but stated she will call her PMD

## 2014-12-09 ENCOUNTER — Inpatient Hospital Stay (HOSPITAL_BASED_OUTPATIENT_CLINIC_OR_DEPARTMENT_OTHER): Payer: Commercial Managed Care - HMO | Admitting: Hematology and Oncology

## 2014-12-09 ENCOUNTER — Other Ambulatory Visit: Payer: Self-pay

## 2014-12-09 ENCOUNTER — Inpatient Hospital Stay: Payer: Commercial Managed Care - HMO

## 2014-12-09 ENCOUNTER — Inpatient Hospital Stay: Payer: Commercial Managed Care - HMO | Attending: Hematology and Oncology

## 2014-12-09 VITALS — BP 182/84 | HR 85 | Temp 98.1°F | Ht 68.0 in | Wt 216.9 lb

## 2014-12-09 DIAGNOSIS — C50912 Malignant neoplasm of unspecified site of left female breast: Secondary | ICD-10-CM | POA: Diagnosis not present

## 2014-12-09 DIAGNOSIS — M8580 Other specified disorders of bone density and structure, unspecified site: Secondary | ICD-10-CM

## 2014-12-09 DIAGNOSIS — R160 Hepatomegaly, not elsewhere classified: Secondary | ICD-10-CM | POA: Diagnosis not present

## 2014-12-09 DIAGNOSIS — Z5111 Encounter for antineoplastic chemotherapy: Secondary | ICD-10-CM | POA: Diagnosis not present

## 2014-12-09 DIAGNOSIS — G629 Polyneuropathy, unspecified: Secondary | ICD-10-CM | POA: Diagnosis not present

## 2014-12-09 DIAGNOSIS — F17210 Nicotine dependence, cigarettes, uncomplicated: Secondary | ICD-10-CM | POA: Insufficient documentation

## 2014-12-09 DIAGNOSIS — Z418 Encounter for other procedures for purposes other than remedying health state: Secondary | ICD-10-CM | POA: Insufficient documentation

## 2014-12-09 DIAGNOSIS — Z170 Estrogen receptor positive status [ER+]: Secondary | ICD-10-CM

## 2014-12-09 DIAGNOSIS — Z79899 Other long term (current) drug therapy: Secondary | ICD-10-CM

## 2014-12-09 DIAGNOSIS — I10 Essential (primary) hypertension: Secondary | ICD-10-CM | POA: Insufficient documentation

## 2014-12-09 DIAGNOSIS — C50919 Malignant neoplasm of unspecified site of unspecified female breast: Secondary | ICD-10-CM

## 2014-12-09 LAB — CBC WITH DIFFERENTIAL/PLATELET
Basophils Absolute: 0.1 10*3/uL (ref 0–0.1)
Basophils Relative: 1 %
Eosinophils Absolute: 0 10*3/uL (ref 0–0.7)
Eosinophils Relative: 0 %
HCT: 40.3 % (ref 35.0–47.0)
Hemoglobin: 12.9 g/dL (ref 12.0–16.0)
Lymphocytes Relative: 8 %
Lymphs Abs: 1 10*3/uL (ref 1.0–3.6)
MCH: 27 pg (ref 26.0–34.0)
MCHC: 32 g/dL (ref 32.0–36.0)
MCV: 84.2 fL (ref 80.0–100.0)
Monocytes Absolute: 0.6 10*3/uL (ref 0.2–0.9)
Monocytes Relative: 5 %
Neutro Abs: 10.9 10*3/uL — ABNORMAL HIGH (ref 1.4–6.5)
Neutrophils Relative %: 86 %
Platelets: 235 10*3/uL (ref 150–440)
RBC: 4.79 MIL/uL (ref 3.80–5.20)
RDW: 16.6 % — ABNORMAL HIGH (ref 11.5–14.5)
WBC: 12.6 10*3/uL — ABNORMAL HIGH (ref 3.6–11.0)

## 2014-12-09 LAB — COMPREHENSIVE METABOLIC PANEL
ALT: 23 U/L (ref 14–54)
AST: 23 U/L (ref 15–41)
Albumin: 4.1 g/dL (ref 3.5–5.0)
Alkaline Phosphatase: 79 U/L (ref 38–126)
Anion gap: 11 (ref 5–15)
BUN: 11 mg/dL (ref 6–20)
CO2: 21 mmol/L — ABNORMAL LOW (ref 22–32)
Calcium: 9.3 mg/dL (ref 8.9–10.3)
Chloride: 106 mmol/L (ref 101–111)
Creatinine, Ser: 0.8 mg/dL (ref 0.44–1.00)
GFR calc Af Amer: >60 mL/min (ref >60)
GFR calc non Af Amer: >60 mL/min (ref >60)
Glucose, Bld: 185 mg/dL — ABNORMAL HIGH (ref 65–99)
Potassium: 3.9 mmol/L (ref 3.5–5.1)
Sodium: 138 mmol/L (ref 135–145)
Total Bilirubin: 0.5 mg/dL (ref 0.3–1.2)
Total Protein: 7.7 g/dL (ref 6.5–8.1)

## 2014-12-09 LAB — MAGNESIUM: Magnesium: 2.1 mg/dL (ref 1.7–2.4)

## 2014-12-09 MED ORDER — OXYCODONE-ACETAMINOPHEN 5-325 MG PO TABS: 1.0000 | ORAL_TABLET | Freq: Four times a day (QID) | ORAL | Status: DC | PRN

## 2014-12-09 MED ORDER — SODIUM CHLORIDE 0.9 % IV SOLN
Freq: Once | INTRAVENOUS | Status: AC
  Administered 2014-12-09: 11:00:00 via INTRAVENOUS
  Filled 2014-12-09: qty 1000

## 2014-12-09 MED ORDER — DIPHENHYDRAMINE HCL 50 MG/ML IJ SOLN
25.0000 mg | Freq: Once | INTRAMUSCULAR | Status: AC | PRN
  Administered 2014-12-09: 25 mg via INTRAVENOUS
  Filled 2014-12-09: qty 1

## 2014-12-09 MED ORDER — FAMOTIDINE IN NACL 20-0.9 MG/50ML-% IV SOLN
20.0000 mg | Freq: Once | INTRAVENOUS | Status: AC | PRN
  Administered 2014-12-09: 20 mg via INTRAVENOUS
  Filled 2014-12-09: qty 50

## 2014-12-09 MED ORDER — SODIUM CHLORIDE 0.9 % IV SOLN
600.0000 mg/m2 | Freq: Once | INTRAVENOUS | Status: AC
  Administered 2014-12-09: 1300 mg via INTRAVENOUS
  Filled 2014-12-09: qty 50

## 2014-12-09 MED ORDER — SODIUM CHLORIDE 0.9 % IV SOLN
Freq: Once | INTRAVENOUS | Status: AC
  Administered 2014-12-09: 11:00:00 via INTRAVENOUS
  Filled 2014-12-09: qty 5

## 2014-12-09 MED ORDER — HEPARIN SOD (PORK) LOCK FLUSH 100 UNIT/ML IV SOLN
500.0000 [IU] | Freq: Once | INTRAVENOUS | Status: DC | PRN
  Filled 2014-12-09: qty 5

## 2014-12-09 MED ORDER — DOCETAXEL CHEMO INJECTION 160 MG/16ML
75.0000 mg/m2 | Freq: Once | INTRAVENOUS | Status: AC
  Administered 2014-12-09: 160 mg via INTRAVENOUS
  Filled 2014-12-09: qty 16

## 2014-12-09 MED ORDER — SODIUM CHLORIDE 0.9 % IJ SOLN
10.0000 mL | INTRAMUSCULAR | Status: DC | PRN
  Filled 2014-12-09: qty 10

## 2014-12-09 MED ORDER — PALONOSETRON HCL INJECTION 0.25 MG/5ML
0.2500 mg | Freq: Once | INTRAVENOUS | Status: AC
  Administered 2014-12-09: 0.25 mg via INTRAVENOUS
  Filled 2014-12-09: qty 5

## 2014-12-09 NOTE — Progress Notes (Signed)
[facility] day:  12/09/2014  Chief Complaint: Donna Riley is a 54 y.o. female with stage IIA left breast cancer who is seen for assessment prior to cycle #4 Taxotere and Cytoxan.  HPI: The patient was last seen in the medical oncology clinic on 11/18/2014.  At that time, she was doing well.  She had a slight neuropathy which was not painful and not affecting function.  She received cycle #3 TC uneventfully.  Symptomatically, she feels good today.  She notes a knot under her left arm.  Bones ached after last chemotherapy.  She also notes that 5 days after chemotherapy, her bowel movements burned like fire for 2 days.  She has a slight neuropathy which does not affect function.  She notes slight blurry vision.  Last eye check was 2 years ago.  Past Medical History  Diagnosis Date  . Prediabetes   . Elevated LFTs   . Positive PPD, treated   . Hypertension   . Arthritis   . Breast cancer     left    Past Surgical History  Procedure Laterality Date  . Cholecystectomy    . Tubal ligation Bilateral   . Breast lumpectomy with needle localization and axillary sentinel lymph node bx Left   . Breast biopsy Left     Family History  Problem Relation Age of Onset  . Cancer Mother     Pancreatic  . Cancer Father     Throat  . Cancer Maternal Grandmother     Breast    Social History:  reports that she has been smoking Cigarettes.  She has a 57 pack-year smoking history. She does not have any smokeless tobacco history on file. She reports that she drinks alcohol. She reports that she does not use illicit drugs.  The patient is alone today.  Allergies: No Known Allergies  Current Medications: Current Outpatient Prescriptions  Medication Sig Dispense Refill  . amoxicillin-clavulanate (AUGMENTIN) 875-125 MG per tablet Take 1 tablet by mouth 2 (two) times daily.    Marland Kitchen BAYER BREEZE 2 TEST DISK     . dexamethasone (DECADRON) 4 MG tablet Take 2  tablets by mouth twice a day on the day before and the day after chemotherapy 24 tablet 0  . HYDROcodone-acetaminophen (NORCO/VICODIN) 5-325 MG per tablet Take 1-2 tablets by mouth every 4 (four) hours as needed for moderate pain. 40 tablet 0  . lisinopril (PRINIVIL,ZESTRIL) 20 MG tablet Take 20 mg by mouth daily.    . metFORMIN (GLUCOPHAGE) 500 MG tablet Take 500 mg by mouth daily.    Marland Kitchen MICROLET LANCETS MISC     . ondansetron (ZOFRAN) 8 MG tablet Take 1 tablet (8 mg total) by mouth every 8 (eight) hours as needed for nausea or vomiting. 20 tablet 0  . oxyCODONE-acetaminophen (PERCOCET/ROXICET) 5-325 MG per tablet Take 1 tablet by mouth every 6 (six) hours as needed for moderate pain or severe pain. 40 tablet 0   No current facility-administered medications for this visit.    Review of Systems:  GENERAL: Feels good today. Active. No fevers, sweats or weight loss. PERFORMANCE STATUS (ECOG): 1 HEENT: Slight blurry vision.  No runny nose, sore throat, mouth sores or tenderness. Lungs: No shortness of breath or cough. No hemoptysis. Cardiac: No chest pain, palpitations, orthopnea, or PND. BREAST: Sometimes drainage from left nipple. GI: No nausea, vomiting, diarrhea, constipation, melena or hematochezia. GU: No urgency, frequency, dysuria, or hematuria. Musculoskeletal: No back pain.  No joint pain. No muscle tenderness. Extremities: No pain or swelling. Skin: Left axillary area healed.  Feels another knot coming up under arm. Neuro: Slight transient neuropathy.  No headache, numbness or weakness, balance or coordination issues. Endocrine: No diabetes, thyroid issues, hot flashes or night sweats. Psych: No mood changes, depression or anxiety. Pain: No focal pain. Review of systems: All other systems reviewed and found to be negative.  Physical Exam: There were no vitals taken for this visit. GENERAL: Well developed, well nourished, sitting comfortably in the exam room  in no acute distress. MENTAL STATUS: Alert and oriented to person, place and time. HEAD: Long brown wig. Normocephalic, atraumatic, face symmetric, no Cushingoid features. EYES: Brown eyes. Pupils equal round and reactive to light and accomodation. No conjunctivitis or scleral icterus. ENT: Oropharynx clear without lesion. Tongue normal. Mucous membranes moist.  RESPIRATORY: Clear to auscultation without rales, wheezes or rhonchi. CARDIOVASCULAR: Regular rate and rhythm without murmur, rub or gallop. ABDOMEN: Soft, non-tender, with active bowel sounds, and no hepatosplenomegaly. No masses. SKIN:  Slight left axillary subcutaneous nodularity assciated with known hidradenitis suppurativa.  No erythema or induration. No rashes, ulcers or lesions. EXTREMITIES: No edema, no skin discoloration or tenderness. No palpable cords. LYMPH NODES: No palpable cervical, supraclavicular, axillary or inguinal adenopathy  NEUROLOGICAL: Unremarkable.  Bilateral patellar reflexes 2+. PSYCH: Appropriate.  Appointment on 12/09/2014  Component Date Value Ref Range Status  . WBC 12/09/2014 12.6* 3.6 - 11.0 K/uL Final  . RBC 12/09/2014 4.79  3.80 - 5.20 MIL/uL Final  . Hemoglobin 12/09/2014 12.9  12.0 - 16.0 g/dL Final  . HCT 12/09/2014 40.3  35.0 - 47.0 % Final  . MCV 12/09/2014 84.2  80.0 - 100.0 fL Final  . MCH 12/09/2014 27.0  26.0 - 34.0 pg Final  . MCHC 12/09/2014 32.0  32.0 - 36.0 g/dL Final  . RDW 12/09/2014 16.6* 11.5 - 14.5 % Final  . Platelets 12/09/2014 235  150 - 440 K/uL Final  . Neutrophils Relative % 12/09/2014 86   Final  . Neutro Abs 12/09/2014 10.9* 1.4 - 6.5 K/uL Final  . Lymphocytes Relative 12/09/2014 8   Final  . Lymphs Abs 12/09/2014 1.0  1.0 - 3.6 K/uL Final  . Monocytes Relative 12/09/2014 5   Final  . Monocytes Absolute 12/09/2014 0.6  0.2 - 0.9 K/uL Final  . Eosinophils Relative 12/09/2014 0   Final  . Eosinophils Absolute 12/09/2014 0.0  0 - 0.7 K/uL Final  .  Basophils Relative 12/09/2014 1   Final  . Basophils Absolute 12/09/2014 0.1  0 - 0.1 K/uL Final    Assessment:  Donna Riley is a 54 y.o. African American female with stage IIA left breast cancer status post partial mastectomy and sentinel lymph node biopsy on 08/27/2014. 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 There was lymphovascular invasion. One of 2 sentinel lymph nodes were positive (focus of 2.8 mm). Tumor was > 90% ER positive, > 90% PR positive, and Her2/neu negative. Pathologic stage was T1bN1aM0.  Bone scan on 09/16/2014 revealed abnormal focal uptake at the level of right L3 pedicle and L5 vertebral body. Lumbar spine MRI on 09/27/2014 revealed no evidence of metastatic disease with lower lumbar facet arthritis. Abdominal and pelvic CT scan on 09/24/2014 revealed hepatomegaly and no evidence of metastatic disease.   Bone density study on 09/15/2014 revealed a T-score of -2.1 at L1-L4 (osteopenia).  She is status post  3 cycles of Taxotere and Cytoxan (09/29/2014 - 11/18/2014) with Neulasta support. Cycle #1 was complicated by Neulasta induced bone pain requiring pain medications. She underwent I&D of a left axillary abcess (hidradenitis suppurativa) on 10/21/2014.   Symptomatically, she is doing well.  She has a grade I neuropathy which is not affecting function.  Exam is normal.  Plan: 1.  Labs today: CBC with diff, CMP, Mg. 2.  Cycle #4 TC today. 3.  RTC tomorrow for Neulasta. 4.  Refill Percocet 5/325; Dis #40. 5.  Schedule follow-up appointment with Dr. Baruch Gouty for planned radiation. 6.  Patient to call after radiation complete for MD appointment, labs (CBC with diff, CMP), and initiation of hormonal therapy.   Lequita Asal, MD

## 2014-12-09 NOTE — Progress Notes (Signed)
Pt here today for follow up and treatment; c/o burning around rectal area after bowel movement which starts about 4 -5 days after treatment/ c/o bone pain

## 2014-12-10 ENCOUNTER — Inpatient Hospital Stay: Payer: Commercial Managed Care - HMO

## 2014-12-10 VITALS — BP 179/90 | HR 86 | Resp 20

## 2014-12-10 DIAGNOSIS — C50912 Malignant neoplasm of unspecified site of left female breast: Secondary | ICD-10-CM

## 2014-12-10 DIAGNOSIS — R160 Hepatomegaly, not elsewhere classified: Secondary | ICD-10-CM | POA: Diagnosis not present

## 2014-12-10 DIAGNOSIS — Z170 Estrogen receptor positive status [ER+]: Secondary | ICD-10-CM | POA: Diagnosis not present

## 2014-12-10 DIAGNOSIS — Z5111 Encounter for antineoplastic chemotherapy: Secondary | ICD-10-CM | POA: Diagnosis not present

## 2014-12-10 DIAGNOSIS — M8580 Other specified disorders of bone density and structure, unspecified site: Secondary | ICD-10-CM | POA: Diagnosis not present

## 2014-12-10 DIAGNOSIS — Z79899 Other long term (current) drug therapy: Secondary | ICD-10-CM | POA: Diagnosis not present

## 2014-12-10 DIAGNOSIS — G629 Polyneuropathy, unspecified: Secondary | ICD-10-CM | POA: Diagnosis not present

## 2014-12-10 DIAGNOSIS — Z418 Encounter for other procedures for purposes other than remedying health state: Secondary | ICD-10-CM | POA: Diagnosis not present

## 2014-12-10 DIAGNOSIS — I10 Essential (primary) hypertension: Secondary | ICD-10-CM | POA: Diagnosis not present

## 2014-12-10 MED ORDER — PEGFILGRASTIM INJECTION 6 MG/0.6ML ~~LOC~~
6.0000 mg | PREFILLED_SYRINGE | Freq: Once | SUBCUTANEOUS | Status: AC
  Administered 2014-12-10: 6 mg via SUBCUTANEOUS
  Filled 2014-12-10: qty 0.6

## 2014-12-27 ENCOUNTER — Encounter: Payer: Self-pay | Admitting: Hematology and Oncology

## 2014-12-29 ENCOUNTER — Encounter: Payer: Self-pay | Admitting: Radiation Oncology

## 2014-12-29 ENCOUNTER — Ambulatory Visit
Admission: RE | Admit: 2014-12-29 | Discharge: 2014-12-29 | Disposition: A | Discharge status: Home / Self Care | Payer: Commercial Managed Care - HMO | Source: Ambulatory Visit | Attending: Radiation Oncology | Admitting: Radiation Oncology

## 2014-12-29 VITALS — BP 155/82 | HR 87 | Temp 98.0°F | Resp 20 | Wt 212.5 lb

## 2014-12-29 DIAGNOSIS — Z510 Encounter for antineoplastic radiation therapy: Secondary | ICD-10-CM | POA: Insufficient documentation

## 2014-12-29 DIAGNOSIS — C779 Secondary and unspecified malignant neoplasm of lymph node, unspecified: Secondary | ICD-10-CM | POA: Insufficient documentation

## 2014-12-29 DIAGNOSIS — C50212 Malignant neoplasm of upper-inner quadrant of left female breast: Secondary | ICD-10-CM | POA: Diagnosis not present

## 2014-12-29 DIAGNOSIS — C50912 Malignant neoplasm of unspecified site of left female breast: Secondary | ICD-10-CM | POA: Diagnosis not present

## 2014-12-29 DIAGNOSIS — Z170 Estrogen receptor positive status [ER+]: Secondary | ICD-10-CM | POA: Insufficient documentation

## 2014-12-29 NOTE — Progress Notes (Signed)
Radiation Oncology Follow up Note  Name: Donna Riley   Date:   12/29/2014 MRN:  [id number] DOB: 02/03/1961    This 54 y.o. female presents to the clinic today for initiation of radiation therapy portion of her treatment plan.  REFERRING PROVIDER: Glean Hess, MD  HPI: patient is a 54 year old female initially consult back in April 2016 when she presented with a stage IIa (T1 be N1 M0) invasive mammary carcinoma of the left breast status post wide local excision and sentinel node biopsy. Tumor was ER/PR positive HER-2/neu not overexpressed. She did have lymphovascular invasion present in one of 2 sentinel lymph nodes had a 2.8 mm focus of metastatic disease..she is undergone systemic chemotherapy with TC has done well with only slight neuropathy.she just completed her fourth cycle and is scheduled for radiation therapy as initially planned. She initially had some increased uptake on PET CT scan of the lumbar spine on MRI scan showed no evidence of metastatic disease. She specifically denies breast tenderness cough or bone pain.  COMPLICATIONS OF TREATMENT: none  FOLLOW UP COMPLIANCE: keeps appointments   PHYSICAL EXAM:  BP 155/82 mmHg  Pulse 87  Temp(Src) 98 F (36.7 C)  Resp 20  Wt 212 lb 8.4 oz (96.4 kg) Well-developed slightly obese female in NAD. No dominant mass or nodularity is noted in either breast in 2 positions examined. No axillary or supraclavicular adenopathy is appreciated. Well-developed well-nourished patient in NAD. HEENT reveals PERLA, EOMI, discs not visualized.  Oral cavity is clear. No oral mucosal lesions are identified. Neck is clear without evidence of cervical or supraclavicular adenopathy. Lungs are clear to A&P. Cardiac examination is essentially unremarkable with regular rate and rhythm without murmur rub or thrill. Abdomen is benign with no organomegaly or masses noted. Motor sensory and DTR levels are equal and symmetric in the upper and lower  extremities. Cranial nerves II through XII are grossly intact. Proprioception is intact. No peripheral adenopathy or edema is identified. No motor or sensory levels are noted. Crude visual fields are within normal range.   RADIOLOGY RESULTS: no current films are reviewed  PLAN: at this time I to go ahead with whole breast radiation to her left breast as well as her peripheral lymphatics. I will treat her peripheral lymphatics based on the limited sentinel node biopsies and +2.8 mm focus of metastatic disease in one of the sentinel nodes. I will treat her breast and peripheral lymphatics to 5000 cGy boosting her scar another1400 cGy using electron beam. Risks and benefits of treatment including skin reaction, fatigue, alteration of blood counts, possible of lymphedema in her left upper extremity were all explained in detail. I've asked her to exercise as much with left upper extremity is possible to prevent lymphedema. I have set up and ordered CT simulation this week. Patient also would be a candidate for aromatase inhibitor therapy after completion of radiation.  I would like to take this opportunity for allowing me to participate in the care of your patient.Armstead Peaks., MD

## 2015-01-01 ENCOUNTER — Ambulatory Visit
Admission: RE | Admit: 2015-01-01 | Discharge: 2015-01-01 | Disposition: A | Discharge status: Home / Self Care | Payer: Commercial Managed Care - HMO | Source: Ambulatory Visit | Attending: Radiation Oncology | Admitting: Radiation Oncology

## 2015-01-01 DIAGNOSIS — Z510 Encounter for antineoplastic radiation therapy: Secondary | ICD-10-CM | POA: Diagnosis not present

## 2015-01-01 DIAGNOSIS — Z170 Estrogen receptor positive status [ER+]: Secondary | ICD-10-CM | POA: Diagnosis not present

## 2015-01-01 DIAGNOSIS — C50912 Malignant neoplasm of unspecified site of left female breast: Secondary | ICD-10-CM | POA: Diagnosis not present

## 2015-01-01 DIAGNOSIS — C779 Secondary and unspecified malignant neoplasm of lymph node, unspecified: Secondary | ICD-10-CM | POA: Diagnosis not present

## 2015-01-01 DIAGNOSIS — C50212 Malignant neoplasm of upper-inner quadrant of left female breast: Secondary | ICD-10-CM | POA: Diagnosis not present

## 2015-01-02 ENCOUNTER — Other Ambulatory Visit: Payer: Self-pay | Admitting: *Deleted

## 2015-01-02 DIAGNOSIS — C50912 Malignant neoplasm of unspecified site of left female breast: Secondary | ICD-10-CM

## 2015-01-07 DIAGNOSIS — C50912 Malignant neoplasm of unspecified site of left female breast: Secondary | ICD-10-CM | POA: Diagnosis not present

## 2015-01-07 DIAGNOSIS — Z510 Encounter for antineoplastic radiation therapy: Secondary | ICD-10-CM | POA: Diagnosis not present

## 2015-01-07 DIAGNOSIS — C779 Secondary and unspecified malignant neoplasm of lymph node, unspecified: Secondary | ICD-10-CM | POA: Diagnosis not present

## 2015-01-07 DIAGNOSIS — Z170 Estrogen receptor positive status [ER+]: Secondary | ICD-10-CM | POA: Diagnosis not present

## 2015-01-08 ENCOUNTER — Ambulatory Visit
Admission: RE | Admit: 2015-01-08 | Discharge: 2015-01-08 | Disposition: A | Discharge status: Home / Self Care | Payer: Commercial Managed Care - HMO | Source: Ambulatory Visit | Attending: Radiation Oncology | Admitting: Radiation Oncology

## 2015-01-08 DIAGNOSIS — Z170 Estrogen receptor positive status [ER+]: Secondary | ICD-10-CM | POA: Diagnosis not present

## 2015-01-08 DIAGNOSIS — C50212 Malignant neoplasm of upper-inner quadrant of left female breast: Secondary | ICD-10-CM | POA: Diagnosis not present

## 2015-01-08 DIAGNOSIS — C50912 Malignant neoplasm of unspecified site of left female breast: Secondary | ICD-10-CM | POA: Diagnosis not present

## 2015-01-08 DIAGNOSIS — C779 Secondary and unspecified malignant neoplasm of lymph node, unspecified: Secondary | ICD-10-CM | POA: Diagnosis not present

## 2015-01-08 DIAGNOSIS — Z510 Encounter for antineoplastic radiation therapy: Secondary | ICD-10-CM | POA: Diagnosis not present

## 2015-01-12 ENCOUNTER — Ambulatory Visit
Admission: RE | Admit: 2015-01-12 | Discharge: 2015-01-12 | Disposition: A | Discharge status: Home / Self Care | Payer: Commercial Managed Care - HMO | Source: Ambulatory Visit | Attending: Radiation Oncology | Admitting: Radiation Oncology

## 2015-01-12 DIAGNOSIS — C50912 Malignant neoplasm of unspecified site of left female breast: Secondary | ICD-10-CM | POA: Diagnosis not present

## 2015-01-12 DIAGNOSIS — C779 Secondary and unspecified malignant neoplasm of lymph node, unspecified: Secondary | ICD-10-CM | POA: Diagnosis not present

## 2015-01-12 DIAGNOSIS — Z170 Estrogen receptor positive status [ER+]: Secondary | ICD-10-CM | POA: Diagnosis not present

## 2015-01-12 DIAGNOSIS — Z510 Encounter for antineoplastic radiation therapy: Secondary | ICD-10-CM | POA: Diagnosis not present

## 2015-01-13 ENCOUNTER — Ambulatory Visit
Admission: RE | Admit: 2015-01-13 | Discharge: 2015-01-13 | Disposition: A | Discharge status: Home / Self Care | Payer: Commercial Managed Care - HMO | Source: Ambulatory Visit | Attending: Radiation Oncology | Admitting: Radiation Oncology

## 2015-01-13 DIAGNOSIS — C779 Secondary and unspecified malignant neoplasm of lymph node, unspecified: Secondary | ICD-10-CM | POA: Diagnosis not present

## 2015-01-13 DIAGNOSIS — C50912 Malignant neoplasm of unspecified site of left female breast: Secondary | ICD-10-CM | POA: Diagnosis not present

## 2015-01-13 DIAGNOSIS — Z170 Estrogen receptor positive status [ER+]: Secondary | ICD-10-CM | POA: Diagnosis not present

## 2015-01-13 DIAGNOSIS — Z510 Encounter for antineoplastic radiation therapy: Secondary | ICD-10-CM | POA: Diagnosis not present

## 2015-01-13 DIAGNOSIS — C50212 Malignant neoplasm of upper-inner quadrant of left female breast: Secondary | ICD-10-CM | POA: Diagnosis not present

## 2015-01-14 ENCOUNTER — Ambulatory Visit
Admission: RE | Admit: 2015-01-14 | Discharge: 2015-01-14 | Disposition: A | Discharge status: Home / Self Care | Payer: Commercial Managed Care - HMO | Source: Ambulatory Visit | Attending: Radiation Oncology | Admitting: Radiation Oncology

## 2015-01-14 DIAGNOSIS — C50912 Malignant neoplasm of unspecified site of left female breast: Secondary | ICD-10-CM | POA: Diagnosis not present

## 2015-01-14 DIAGNOSIS — C779 Secondary and unspecified malignant neoplasm of lymph node, unspecified: Secondary | ICD-10-CM | POA: Diagnosis not present

## 2015-01-14 DIAGNOSIS — Z170 Estrogen receptor positive status [ER+]: Secondary | ICD-10-CM | POA: Diagnosis not present

## 2015-01-14 DIAGNOSIS — Z510 Encounter for antineoplastic radiation therapy: Secondary | ICD-10-CM | POA: Diagnosis not present

## 2015-01-14 DIAGNOSIS — C50212 Malignant neoplasm of upper-inner quadrant of left female breast: Secondary | ICD-10-CM | POA: Diagnosis not present

## 2015-01-15 ENCOUNTER — Ambulatory Visit
Admission: RE | Admit: 2015-01-15 | Discharge: 2015-01-15 | Disposition: A | Discharge status: Home / Self Care | Payer: Commercial Managed Care - HMO | Source: Ambulatory Visit | Attending: Radiation Oncology | Admitting: Radiation Oncology

## 2015-01-15 DIAGNOSIS — C779 Secondary and unspecified malignant neoplasm of lymph node, unspecified: Secondary | ICD-10-CM | POA: Diagnosis not present

## 2015-01-15 DIAGNOSIS — C50912 Malignant neoplasm of unspecified site of left female breast: Secondary | ICD-10-CM | POA: Diagnosis not present

## 2015-01-15 DIAGNOSIS — Z170 Estrogen receptor positive status [ER+]: Secondary | ICD-10-CM | POA: Diagnosis not present

## 2015-01-15 DIAGNOSIS — Z510 Encounter for antineoplastic radiation therapy: Secondary | ICD-10-CM | POA: Diagnosis not present

## 2015-01-16 ENCOUNTER — Ambulatory Visit
Admission: RE | Admit: 2015-01-16 | Discharge: 2015-01-16 | Disposition: A | Discharge status: Home / Self Care | Payer: Commercial Managed Care - HMO | Source: Ambulatory Visit | Attending: Radiation Oncology | Admitting: Radiation Oncology

## 2015-01-16 DIAGNOSIS — C779 Secondary and unspecified malignant neoplasm of lymph node, unspecified: Secondary | ICD-10-CM | POA: Diagnosis not present

## 2015-01-16 DIAGNOSIS — C50912 Malignant neoplasm of unspecified site of left female breast: Secondary | ICD-10-CM | POA: Diagnosis not present

## 2015-01-16 DIAGNOSIS — Z170 Estrogen receptor positive status [ER+]: Secondary | ICD-10-CM | POA: Diagnosis not present

## 2015-01-16 DIAGNOSIS — Z510 Encounter for antineoplastic radiation therapy: Secondary | ICD-10-CM | POA: Diagnosis not present

## 2015-01-19 ENCOUNTER — Ambulatory Visit
Admission: RE | Admit: 2015-01-19 | Discharge: 2015-01-19 | Disposition: A | Discharge status: Home / Self Care | Payer: Commercial Managed Care - HMO | Source: Ambulatory Visit | Attending: Radiation Oncology | Admitting: Radiation Oncology

## 2015-01-19 DIAGNOSIS — Z170 Estrogen receptor positive status [ER+]: Secondary | ICD-10-CM | POA: Diagnosis not present

## 2015-01-19 DIAGNOSIS — C779 Secondary and unspecified malignant neoplasm of lymph node, unspecified: Secondary | ICD-10-CM | POA: Diagnosis not present

## 2015-01-19 DIAGNOSIS — C50912 Malignant neoplasm of unspecified site of left female breast: Secondary | ICD-10-CM | POA: Diagnosis not present

## 2015-01-19 DIAGNOSIS — Z510 Encounter for antineoplastic radiation therapy: Secondary | ICD-10-CM | POA: Diagnosis not present

## 2015-01-20 ENCOUNTER — Ambulatory Visit
Admission: RE | Admit: 2015-01-20 | Discharge: 2015-01-20 | Disposition: A | Discharge status: Home / Self Care | Payer: Commercial Managed Care - HMO | Source: Ambulatory Visit | Attending: Radiation Oncology | Admitting: Radiation Oncology

## 2015-01-20 DIAGNOSIS — Z510 Encounter for antineoplastic radiation therapy: Secondary | ICD-10-CM | POA: Diagnosis not present

## 2015-01-20 DIAGNOSIS — Z170 Estrogen receptor positive status [ER+]: Secondary | ICD-10-CM | POA: Diagnosis not present

## 2015-01-20 DIAGNOSIS — C779 Secondary and unspecified malignant neoplasm of lymph node, unspecified: Secondary | ICD-10-CM | POA: Diagnosis not present

## 2015-01-20 DIAGNOSIS — C50912 Malignant neoplasm of unspecified site of left female breast: Secondary | ICD-10-CM | POA: Diagnosis not present

## 2015-01-21 ENCOUNTER — Inpatient Hospital Stay: Payer: Commercial Managed Care - HMO | Attending: Radiation Oncology

## 2015-01-21 ENCOUNTER — Ambulatory Visit
Admission: RE | Admit: 2015-01-21 | Discharge: 2015-01-21 | Disposition: A | Discharge status: Home / Self Care | Payer: Commercial Managed Care - HMO | Source: Ambulatory Visit | Attending: Radiation Oncology | Admitting: Radiation Oncology

## 2015-01-21 DIAGNOSIS — Z170 Estrogen receptor positive status [ER+]: Secondary | ICD-10-CM | POA: Diagnosis not present

## 2015-01-21 DIAGNOSIS — C50912 Malignant neoplasm of unspecified site of left female breast: Secondary | ICD-10-CM

## 2015-01-21 DIAGNOSIS — Z510 Encounter for antineoplastic radiation therapy: Secondary | ICD-10-CM | POA: Diagnosis not present

## 2015-01-21 DIAGNOSIS — C50212 Malignant neoplasm of upper-inner quadrant of left female breast: Secondary | ICD-10-CM | POA: Diagnosis not present

## 2015-01-21 DIAGNOSIS — C779 Secondary and unspecified malignant neoplasm of lymph node, unspecified: Secondary | ICD-10-CM | POA: Diagnosis not present

## 2015-01-21 LAB — CBC
HCT: 39.6 % (ref 35.0–47.0)
Hemoglobin: 13.1 g/dL (ref 12.0–16.0)
MCH: 27.2 pg (ref 26.0–34.0)
MCHC: 32.9 g/dL (ref 32.0–36.0)
MCV: 82.6 fL (ref 80.0–100.0)
Platelets: 191 10*3/uL (ref 150–440)
RBC: 4.8 MIL/uL (ref 3.80–5.20)
RDW: 15.3 % — ABNORMAL HIGH (ref 11.5–14.5)
WBC: 5.7 10*3/uL (ref 3.6–11.0)

## 2015-01-22 ENCOUNTER — Telehealth: Payer: Self-pay

## 2015-01-22 ENCOUNTER — Ambulatory Visit
Admission: RE | Admit: 2015-01-22 | Discharge: 2015-01-22 | Disposition: A | Discharge status: Home / Self Care | Payer: Commercial Managed Care - HMO | Source: Ambulatory Visit | Attending: Radiation Oncology | Admitting: Radiation Oncology

## 2015-01-22 DIAGNOSIS — C779 Secondary and unspecified malignant neoplasm of lymph node, unspecified: Secondary | ICD-10-CM | POA: Diagnosis not present

## 2015-01-22 DIAGNOSIS — Z510 Encounter for antineoplastic radiation therapy: Secondary | ICD-10-CM | POA: Diagnosis not present

## 2015-01-22 DIAGNOSIS — C50912 Malignant neoplasm of unspecified site of left female breast: Secondary | ICD-10-CM | POA: Diagnosis not present

## 2015-01-22 DIAGNOSIS — Z170 Estrogen receptor positive status [ER+]: Secondary | ICD-10-CM | POA: Diagnosis not present

## 2015-01-22 NOTE — Telephone Encounter (Signed)
Returned call to patient at this time. No answer. Left voicemail for return phone call.  Plan after speaking with Dr. Adonis Huguenin; Bring patient in for appointment at 8:45am if possible tomorrow. If patient cannot make this, tell her to come up after radiation is complete in cancer center.

## 2015-01-22 NOTE — Telephone Encounter (Signed)
Dr. Leanora Cover removed pt's left axilla lymph nodes due to breast cancer earlier this year. Pt called today stating she is experiencing pain and tenderness to touch. It started last week after her radiation treatment. Pt would like some advice on what to do. She thinks she is getting a cyst there. Please advise. Pt can be reached at [phone].

## 2015-01-22 NOTE — Progress Notes (Signed)
"  Thinking of you" card mailed to patient.

## 2015-01-23 ENCOUNTER — Encounter: Payer: Self-pay | Admitting: General Surgery

## 2015-01-23 ENCOUNTER — Ambulatory Visit: Payer: Self-pay | Admitting: General Surgery

## 2015-01-23 ENCOUNTER — Ambulatory Visit (INDEPENDENT_AMBULATORY_CARE_PROVIDER_SITE_OTHER): Payer: Commercial Managed Care - HMO | Admitting: General Surgery

## 2015-01-23 ENCOUNTER — Ambulatory Visit: Payer: Commercial Managed Care - HMO

## 2015-01-23 VITALS — BP 197/91 | HR 81 | Temp 98.3°F | Ht 68.0 in | Wt 216.0 lb

## 2015-01-23 DIAGNOSIS — L732 Hidradenitis suppurativa: Secondary | ICD-10-CM

## 2015-01-23 HISTORY — DX: Hidradenitis suppurativa: L73.2

## 2015-01-23 MED ORDER — HYDROCODONE-ACETAMINOPHEN 5-325 MG PO TABS: 1.0000 | ORAL_TABLET | Freq: Four times a day (QID) | ORAL | Status: DC | PRN

## 2015-01-23 MED ORDER — LIDOCAINE HCL 4 % EX SOLN: CUTANEOUS | Status: DC | PRN

## 2015-01-23 MED ORDER — SULFAMETHOXAZOLE-TRIMETHOPRIM 800-160 MG PO TABS: 1.0000 | ORAL_TABLET | Freq: Two times a day (BID) | ORAL | Status: DC

## 2015-01-23 MED ORDER — CLINDAMYCIN PHOSPHATE 1 % EX LOTN: TOPICAL_LOTION | Freq: Two times a day (BID) | CUTANEOUS | Status: DC

## 2015-01-23 NOTE — Progress Notes (Signed)
Outpatient Surgical Follow Up  01/23/2015  Donna Riley is an 54 y.o. female.   Chief Complaint  Patient presents with  . Other    Sebaceous cyst on Left Breast    HPI: 54 year old female known to the surgery department for a history of left-sided breast cancer. She is status post left breast lumpectomy with sentinel lymph node biopsy area. She is currently undergoing radiation treatment for her breast cancer. Patient states that approximately week ago area in her axilla started to have drainage. She started radiation during that time period. She states that she has radiation for breast and her axilla due to her breast cancer. The pain has somewhat with continued drainage that she canceled her radiation appointment today to be seen in the surgery clinic. This my first time evaluating her for this however her exam is limited due to her reports of pain to even light touch in the radiated field of her axilla. Per her and her husband the pain has been progressively worsening throughout the week during radiation, and there has been an increase in drainage from the axilla over the last 24 hours. It is reported to be a low and blood-tinged. This is happen once before she was treated with oral antibiotics by an additional surgeon. She currently denies any fever, chills, nausea, vomiting. Her only complaint is of the pain from her axilla.  Past Medical History  Diagnosis Date  . Prediabetes   . Elevated LFTs   . Positive PPD, treated   . Hypertension   . Arthritis   . Breast cancer     left    Past Surgical History  Procedure Laterality Date  . Cholecystectomy    . Tubal ligation Bilateral   . Breast lumpectomy with needle localization and axillary sentinel lymph node bx Left   . Breast biopsy Left     Family History  Problem Relation Age of Onset  . Cancer Mother     Pancreatic  . Cancer Father     Throat  . Cancer Maternal Grandmother     Breast    Social History:  reports that  she has been smoking Cigarettes.  She has a 57 pack-year smoking history. She does not have any smokeless tobacco history on file. She reports that she drinks alcohol. She reports that she does not use illicit drugs.  Allergies: No Known Allergies  Medications reviewed.    ROS multisystem review of systems was completed. All pertinent positives and negatives were within history of present illness the remainder are negative.    BP 197/91 mmHg  Pulse 81  Temp(Src) 98.3 F (36.8 C) (Oral)  Ht 5\' 8"  (1.727 m)  Wt 97.977 kg (216 lb)  BMI 32.85 kg/m2  Physical Exam Gen.: No acute distress, obvious discomfort with her left arm raised. Chest: Clear to auscultation, heart regular rate and rhythm Breast: Obvious radiation changes to her left breast with a well healed lump lumpectomy site. Her left axilla has a visible radiation changes, to obvious poor sites which the patient reports the drainage from. No expressible drainage on exam today. No palpable fluctuance or visible erythema. The area is exquisitely tender to even light touch on exam. Abdomen: Soft, nontender, nondistended.    Results for orders placed or performed in visit on 01/21/15 (from the past 48 hour(s))  CBC     Status: Abnormal   Collection Time: 01/21/15 10:20 AM  Result Value Ref Range   WBC 5.7 3.6 - 11.0 K/uL  RBC 4.80 3.80 - 5.20 MIL/uL   Hemoglobin 13.1 12.0 - 16.0 g/dL   HCT 39.6 35.0 - 47.0 %   MCV 82.6 80.0 - 100.0 fL   MCH 27.2 26.0 - 34.0 pg   MCHC 32.9 32.0 - 36.0 g/dL   RDW 15.3 (H) 11.5 - 14.5 %   Platelets 191 150 - 440 K/uL   No results found.  Assessment/Plan:  1. Hydradenitis 54 year old female with what appears to be left axillary hidradenitis. This is likely been worsened by her recent start of radiation to the area. Difficult to assess due to early onset radiation changes. Discussed with patient that excisional biopsy right now would be counter productive as it would likely stop her  radiation for cancer treatment. Will attempt to treat medically with antibiotics, both oral and topical. We will also provide with topical local anesthetic and an oral pain medicine and attempts to provide her with some relief. She is counseled to give this the weekends to have some affect however she is to call back on Monday should she not have any improvement in her symptoms. We'll plan see back in one to 2 weeks if there is some improvement. - sulfamethoxazole-trimethoprim (BACTRIM DS,SEPTRA DS) 800-160 MG per tablet; Take 1 tablet by mouth 2 (two) times daily.  Dispense: 28 tablet; Refill: 0 - HYDROcodone-acetaminophen (NORCO) 5-325 MG per tablet; Take 1 tablet by mouth every 6 (six) hours as needed for moderate pain.  Dispense: 30 tablet; Refill: 0 - clindamycin (CLEOCIN-T) 1 % lotion; Apply topically 2 (two) times daily.  Dispense: 60 mL; Refill: 0 - lidocaine (XYLOCAINE) 4 % external solution; Apply topically as needed.  Dispense: 50 mL; Refill: 0     Clayburn Pert  01/23/2015,

## 2015-01-23 NOTE — Patient Instructions (Signed)
Please start taking antibiotics today. Let us know if it does not improve within this weekend. If you feel better, please restart your radiation on Monday.

## 2015-01-26 ENCOUNTER — Ambulatory Visit
Admission: RE | Admit: 2015-01-26 | Discharge: 2015-01-26 | Disposition: A | Discharge status: Home / Self Care | Payer: Commercial Managed Care - HMO | Source: Ambulatory Visit | Attending: Radiation Oncology | Admitting: Radiation Oncology

## 2015-01-26 DIAGNOSIS — C779 Secondary and unspecified malignant neoplasm of lymph node, unspecified: Secondary | ICD-10-CM | POA: Diagnosis not present

## 2015-01-26 DIAGNOSIS — C50912 Malignant neoplasm of unspecified site of left female breast: Secondary | ICD-10-CM | POA: Diagnosis not present

## 2015-01-26 DIAGNOSIS — Z510 Encounter for antineoplastic radiation therapy: Secondary | ICD-10-CM | POA: Diagnosis not present

## 2015-01-26 DIAGNOSIS — Z170 Estrogen receptor positive status [ER+]: Secondary | ICD-10-CM | POA: Diagnosis not present

## 2015-01-27 ENCOUNTER — Ambulatory Visit
Admission: RE | Admit: 2015-01-27 | Discharge: 2015-01-27 | Disposition: A | Discharge status: Home / Self Care | Payer: Commercial Managed Care - HMO | Source: Ambulatory Visit | Attending: Radiation Oncology | Admitting: Radiation Oncology

## 2015-01-27 ENCOUNTER — Other Ambulatory Visit: Payer: Self-pay | Admitting: *Deleted

## 2015-01-27 DIAGNOSIS — C50912 Malignant neoplasm of unspecified site of left female breast: Secondary | ICD-10-CM | POA: Diagnosis not present

## 2015-01-27 DIAGNOSIS — Z510 Encounter for antineoplastic radiation therapy: Secondary | ICD-10-CM | POA: Diagnosis not present

## 2015-01-27 DIAGNOSIS — Z170 Estrogen receptor positive status [ER+]: Secondary | ICD-10-CM | POA: Diagnosis not present

## 2015-01-27 DIAGNOSIS — Z853 Personal history of malignant neoplasm of breast: Secondary | ICD-10-CM

## 2015-01-27 DIAGNOSIS — C779 Secondary and unspecified malignant neoplasm of lymph node, unspecified: Secondary | ICD-10-CM | POA: Diagnosis not present

## 2015-01-27 MED ORDER — SILVER SULFADIAZINE 1 % EX CREA
1.0000 | TOPICAL_CREAM | Freq: Two times a day (BID) | CUTANEOUS | Status: DC
Start: 2015-01-27 — End: 2015-04-10

## 2015-01-28 ENCOUNTER — Ambulatory Visit
Admission: RE | Admit: 2015-01-28 | Discharge: 2015-01-28 | Disposition: A | Discharge status: Home / Self Care | Payer: Commercial Managed Care - HMO | Source: Ambulatory Visit | Attending: Radiation Oncology | Admitting: Radiation Oncology

## 2015-01-28 DIAGNOSIS — C50912 Malignant neoplasm of unspecified site of left female breast: Secondary | ICD-10-CM | POA: Diagnosis not present

## 2015-01-28 DIAGNOSIS — C779 Secondary and unspecified malignant neoplasm of lymph node, unspecified: Secondary | ICD-10-CM | POA: Diagnosis not present

## 2015-01-28 DIAGNOSIS — Z170 Estrogen receptor positive status [ER+]: Secondary | ICD-10-CM | POA: Diagnosis not present

## 2015-01-28 DIAGNOSIS — Z510 Encounter for antineoplastic radiation therapy: Secondary | ICD-10-CM | POA: Diagnosis not present

## 2015-01-29 ENCOUNTER — Ambulatory Visit
Admission: RE | Admit: 2015-01-29 | Discharge: 2015-01-29 | Disposition: A | Discharge status: Home / Self Care | Payer: Commercial Managed Care - HMO | Source: Ambulatory Visit | Attending: Radiation Oncology | Admitting: Radiation Oncology

## 2015-01-29 DIAGNOSIS — C779 Secondary and unspecified malignant neoplasm of lymph node, unspecified: Secondary | ICD-10-CM | POA: Diagnosis not present

## 2015-01-29 DIAGNOSIS — Z170 Estrogen receptor positive status [ER+]: Secondary | ICD-10-CM | POA: Diagnosis not present

## 2015-01-29 DIAGNOSIS — Z510 Encounter for antineoplastic radiation therapy: Secondary | ICD-10-CM | POA: Diagnosis not present

## 2015-01-29 DIAGNOSIS — C50912 Malignant neoplasm of unspecified site of left female breast: Secondary | ICD-10-CM | POA: Diagnosis not present

## 2015-01-30 ENCOUNTER — Ambulatory Visit
Admission: RE | Admit: 2015-01-30 | Discharge: 2015-01-30 | Disposition: A | Discharge status: Home / Self Care | Payer: Commercial Managed Care - HMO | Source: Ambulatory Visit | Attending: Radiation Oncology | Admitting: Radiation Oncology

## 2015-01-30 ENCOUNTER — Ambulatory Visit: Payer: Commercial Managed Care - HMO | Admitting: Surgery

## 2015-01-30 DIAGNOSIS — C50912 Malignant neoplasm of unspecified site of left female breast: Secondary | ICD-10-CM | POA: Diagnosis not present

## 2015-01-30 DIAGNOSIS — Z170 Estrogen receptor positive status [ER+]: Secondary | ICD-10-CM | POA: Diagnosis not present

## 2015-01-30 DIAGNOSIS — Z510 Encounter for antineoplastic radiation therapy: Secondary | ICD-10-CM | POA: Diagnosis not present

## 2015-01-30 DIAGNOSIS — C779 Secondary and unspecified malignant neoplasm of lymph node, unspecified: Secondary | ICD-10-CM | POA: Diagnosis not present

## 2015-02-03 ENCOUNTER — Ambulatory Visit
Admission: RE | Admit: 2015-02-03 | Discharge: 2015-02-03 | Disposition: A | Discharge status: Home / Self Care | Payer: Commercial Managed Care - HMO | Source: Ambulatory Visit | Attending: Radiation Oncology | Admitting: Radiation Oncology

## 2015-02-03 DIAGNOSIS — Z510 Encounter for antineoplastic radiation therapy: Secondary | ICD-10-CM | POA: Diagnosis not present

## 2015-02-03 DIAGNOSIS — C50912 Malignant neoplasm of unspecified site of left female breast: Secondary | ICD-10-CM | POA: Diagnosis not present

## 2015-02-03 DIAGNOSIS — Z170 Estrogen receptor positive status [ER+]: Secondary | ICD-10-CM | POA: Diagnosis not present

## 2015-02-03 DIAGNOSIS — C779 Secondary and unspecified malignant neoplasm of lymph node, unspecified: Secondary | ICD-10-CM | POA: Diagnosis not present

## 2015-02-04 ENCOUNTER — Inpatient Hospital Stay: Payer: Commercial Managed Care - HMO | Attending: Radiation Oncology

## 2015-02-04 ENCOUNTER — Ambulatory Visit
Admission: RE | Admit: 2015-02-04 | Discharge: 2015-02-04 | Disposition: A | Discharge status: Home / Self Care | Payer: Commercial Managed Care - HMO | Source: Ambulatory Visit | Attending: Radiation Oncology | Admitting: Radiation Oncology

## 2015-02-04 DIAGNOSIS — C779 Secondary and unspecified malignant neoplasm of lymph node, unspecified: Secondary | ICD-10-CM | POA: Diagnosis not present

## 2015-02-04 DIAGNOSIS — C50912 Malignant neoplasm of unspecified site of left female breast: Secondary | ICD-10-CM

## 2015-02-04 DIAGNOSIS — Z170 Estrogen receptor positive status [ER+]: Secondary | ICD-10-CM | POA: Insufficient documentation

## 2015-02-04 DIAGNOSIS — Z510 Encounter for antineoplastic radiation therapy: Secondary | ICD-10-CM | POA: Diagnosis not present

## 2015-02-04 LAB — CBC
HCT: 42.8 % (ref 35.0–47.0)
Hemoglobin: 13.9 g/dL (ref 12.0–16.0)
MCH: 27.1 pg (ref 26.0–34.0)
MCHC: 32.4 g/dL (ref 32.0–36.0)
MCV: 83.7 fL (ref 80.0–100.0)
Platelets: 185 10*3/uL (ref 150–440)
RBC: 5.12 MIL/uL (ref 3.80–5.20)
RDW: 14.1 % (ref 11.5–14.5)
WBC: 4.5 10*3/uL (ref 3.6–11.0)

## 2015-02-05 ENCOUNTER — Ambulatory Visit: Payer: Commercial Managed Care - HMO

## 2015-02-05 DIAGNOSIS — C50912 Malignant neoplasm of unspecified site of left female breast: Secondary | ICD-10-CM | POA: Diagnosis not present

## 2015-02-05 DIAGNOSIS — Z170 Estrogen receptor positive status [ER+]: Secondary | ICD-10-CM | POA: Diagnosis not present

## 2015-02-05 DIAGNOSIS — C50212 Malignant neoplasm of upper-inner quadrant of left female breast: Secondary | ICD-10-CM | POA: Diagnosis not present

## 2015-02-05 DIAGNOSIS — C779 Secondary and unspecified malignant neoplasm of lymph node, unspecified: Secondary | ICD-10-CM | POA: Diagnosis not present

## 2015-02-05 DIAGNOSIS — Z510 Encounter for antineoplastic radiation therapy: Secondary | ICD-10-CM | POA: Diagnosis not present

## 2015-02-06 ENCOUNTER — Other Ambulatory Visit: Payer: Self-pay | Admitting: *Deleted

## 2015-02-06 ENCOUNTER — Ambulatory Visit
Admission: RE | Admit: 2015-02-06 | Discharge: 2015-02-06 | Disposition: A | Discharge status: Home / Self Care | Payer: Commercial Managed Care - HMO | Source: Ambulatory Visit | Attending: Radiation Oncology | Admitting: Radiation Oncology

## 2015-02-06 DIAGNOSIS — Z170 Estrogen receptor positive status [ER+]: Secondary | ICD-10-CM | POA: Diagnosis not present

## 2015-02-06 DIAGNOSIS — C50912 Malignant neoplasm of unspecified site of left female breast: Secondary | ICD-10-CM | POA: Diagnosis not present

## 2015-02-06 DIAGNOSIS — Z510 Encounter for antineoplastic radiation therapy: Secondary | ICD-10-CM | POA: Diagnosis not present

## 2015-02-06 DIAGNOSIS — C779 Secondary and unspecified malignant neoplasm of lymph node, unspecified: Secondary | ICD-10-CM | POA: Diagnosis not present

## 2015-02-06 MED ORDER — SUCRALFATE 1 G PO TABS: 1.0000 g | ORAL_TABLET | Freq: Three times a day (TID) | ORAL | Status: DC

## 2015-02-09 ENCOUNTER — Ambulatory Visit
Admission: RE | Admit: 2015-02-09 | Discharge: 2015-02-09 | Disposition: A | Discharge status: Home / Self Care | Payer: Commercial Managed Care - HMO | Source: Ambulatory Visit | Attending: Radiation Oncology | Admitting: Radiation Oncology

## 2015-02-09 DIAGNOSIS — Z510 Encounter for antineoplastic radiation therapy: Secondary | ICD-10-CM | POA: Diagnosis not present

## 2015-02-09 DIAGNOSIS — Z170 Estrogen receptor positive status [ER+]: Secondary | ICD-10-CM | POA: Diagnosis not present

## 2015-02-09 DIAGNOSIS — C779 Secondary and unspecified malignant neoplasm of lymph node, unspecified: Secondary | ICD-10-CM | POA: Diagnosis not present

## 2015-02-09 DIAGNOSIS — C50912 Malignant neoplasm of unspecified site of left female breast: Secondary | ICD-10-CM | POA: Diagnosis not present

## 2015-02-10 ENCOUNTER — Ambulatory Visit
Admission: RE | Admit: 2015-02-10 | Discharge: 2015-02-10 | Disposition: A | Discharge status: Home / Self Care | Payer: Commercial Managed Care - HMO | Source: Ambulatory Visit | Attending: Radiation Oncology | Admitting: Radiation Oncology

## 2015-02-10 DIAGNOSIS — Z170 Estrogen receptor positive status [ER+]: Secondary | ICD-10-CM | POA: Diagnosis not present

## 2015-02-10 DIAGNOSIS — C50912 Malignant neoplasm of unspecified site of left female breast: Secondary | ICD-10-CM | POA: Diagnosis not present

## 2015-02-10 DIAGNOSIS — C779 Secondary and unspecified malignant neoplasm of lymph node, unspecified: Secondary | ICD-10-CM | POA: Diagnosis not present

## 2015-02-10 DIAGNOSIS — Z510 Encounter for antineoplastic radiation therapy: Secondary | ICD-10-CM | POA: Diagnosis not present

## 2015-02-11 ENCOUNTER — Ambulatory Visit
Admission: RE | Admit: 2015-02-11 | Discharge: 2015-02-11 | Disposition: A | Discharge status: Home / Self Care | Payer: Commercial Managed Care - HMO | Source: Ambulatory Visit | Attending: Radiation Oncology | Admitting: Radiation Oncology

## 2015-02-11 DIAGNOSIS — C50912 Malignant neoplasm of unspecified site of left female breast: Secondary | ICD-10-CM | POA: Diagnosis not present

## 2015-02-11 DIAGNOSIS — C779 Secondary and unspecified malignant neoplasm of lymph node, unspecified: Secondary | ICD-10-CM | POA: Diagnosis not present

## 2015-02-11 DIAGNOSIS — C50212 Malignant neoplasm of upper-inner quadrant of left female breast: Secondary | ICD-10-CM | POA: Diagnosis not present

## 2015-02-11 DIAGNOSIS — Z170 Estrogen receptor positive status [ER+]: Secondary | ICD-10-CM | POA: Diagnosis not present

## 2015-02-11 DIAGNOSIS — Z510 Encounter for antineoplastic radiation therapy: Secondary | ICD-10-CM | POA: Diagnosis not present

## 2015-02-12 ENCOUNTER — Ambulatory Visit
Admission: RE | Admit: 2015-02-12 | Discharge: 2015-02-12 | Disposition: A | Discharge status: Home / Self Care | Payer: Commercial Managed Care - HMO | Source: Ambulatory Visit | Attending: Radiation Oncology | Admitting: Radiation Oncology

## 2015-02-12 DIAGNOSIS — Z510 Encounter for antineoplastic radiation therapy: Secondary | ICD-10-CM | POA: Diagnosis not present

## 2015-02-12 DIAGNOSIS — C779 Secondary and unspecified malignant neoplasm of lymph node, unspecified: Secondary | ICD-10-CM | POA: Diagnosis not present

## 2015-02-12 DIAGNOSIS — Z170 Estrogen receptor positive status [ER+]: Secondary | ICD-10-CM | POA: Diagnosis not present

## 2015-02-12 DIAGNOSIS — C50912 Malignant neoplasm of unspecified site of left female breast: Secondary | ICD-10-CM | POA: Diagnosis not present

## 2015-02-13 ENCOUNTER — Ambulatory Visit
Admission: RE | Admit: 2015-02-13 | Discharge: 2015-02-13 | Disposition: A | Discharge status: Home / Self Care | Payer: Commercial Managed Care - HMO | Source: Ambulatory Visit | Attending: Radiation Oncology | Admitting: Radiation Oncology

## 2015-02-13 DIAGNOSIS — C50912 Malignant neoplasm of unspecified site of left female breast: Secondary | ICD-10-CM | POA: Diagnosis not present

## 2015-02-13 DIAGNOSIS — Z510 Encounter for antineoplastic radiation therapy: Secondary | ICD-10-CM | POA: Diagnosis not present

## 2015-02-13 DIAGNOSIS — C779 Secondary and unspecified malignant neoplasm of lymph node, unspecified: Secondary | ICD-10-CM | POA: Diagnosis not present

## 2015-02-13 DIAGNOSIS — Z170 Estrogen receptor positive status [ER+]: Secondary | ICD-10-CM | POA: Diagnosis not present

## 2015-02-16 ENCOUNTER — Ambulatory Visit
Admission: RE | Admit: 2015-02-16 | Discharge: 2015-02-16 | Disposition: A | Discharge status: Home / Self Care | Payer: Commercial Managed Care - HMO | Source: Ambulatory Visit | Attending: Radiation Oncology | Admitting: Radiation Oncology

## 2015-02-16 DIAGNOSIS — C779 Secondary and unspecified malignant neoplasm of lymph node, unspecified: Secondary | ICD-10-CM | POA: Diagnosis not present

## 2015-02-16 DIAGNOSIS — C50912 Malignant neoplasm of unspecified site of left female breast: Secondary | ICD-10-CM | POA: Diagnosis not present

## 2015-02-16 DIAGNOSIS — Z510 Encounter for antineoplastic radiation therapy: Secondary | ICD-10-CM | POA: Diagnosis not present

## 2015-02-16 DIAGNOSIS — Z170 Estrogen receptor positive status [ER+]: Secondary | ICD-10-CM | POA: Diagnosis not present

## 2015-02-17 ENCOUNTER — Ambulatory Visit
Admission: RE | Admit: 2015-02-17 | Discharge: 2015-02-17 | Disposition: A | Discharge status: Home / Self Care | Payer: Commercial Managed Care - HMO | Source: Ambulatory Visit | Attending: Radiation Oncology | Admitting: Radiation Oncology

## 2015-02-17 DIAGNOSIS — Z170 Estrogen receptor positive status [ER+]: Secondary | ICD-10-CM | POA: Diagnosis not present

## 2015-02-17 DIAGNOSIS — C50912 Malignant neoplasm of unspecified site of left female breast: Secondary | ICD-10-CM | POA: Diagnosis not present

## 2015-02-17 DIAGNOSIS — C779 Secondary and unspecified malignant neoplasm of lymph node, unspecified: Secondary | ICD-10-CM | POA: Diagnosis not present

## 2015-02-17 DIAGNOSIS — Z510 Encounter for antineoplastic radiation therapy: Secondary | ICD-10-CM | POA: Diagnosis not present

## 2015-02-18 ENCOUNTER — Inpatient Hospital Stay: Payer: Commercial Managed Care - HMO

## 2015-02-18 ENCOUNTER — Ambulatory Visit
Admission: RE | Admit: 2015-02-18 | Discharge: 2015-02-18 | Disposition: A | Discharge status: Home / Self Care | Payer: Commercial Managed Care - HMO | Source: Ambulatory Visit | Attending: Radiation Oncology | Admitting: Radiation Oncology

## 2015-02-18 DIAGNOSIS — Z510 Encounter for antineoplastic radiation therapy: Secondary | ICD-10-CM | POA: Diagnosis not present

## 2015-02-18 DIAGNOSIS — Z170 Estrogen receptor positive status [ER+]: Secondary | ICD-10-CM | POA: Diagnosis not present

## 2015-02-18 DIAGNOSIS — C50212 Malignant neoplasm of upper-inner quadrant of left female breast: Secondary | ICD-10-CM | POA: Diagnosis not present

## 2015-02-18 DIAGNOSIS — C50912 Malignant neoplasm of unspecified site of left female breast: Secondary | ICD-10-CM | POA: Diagnosis not present

## 2015-02-18 DIAGNOSIS — C779 Secondary and unspecified malignant neoplasm of lymph node, unspecified: Secondary | ICD-10-CM | POA: Diagnosis not present

## 2015-02-18 LAB — CBC
HCT: 41 % (ref 35.0–47.0)
Hemoglobin: 13.3 g/dL (ref 12.0–16.0)
MCH: 27 pg (ref 26.0–34.0)
MCHC: 32.5 g/dL (ref 32.0–36.0)
MCV: 83.1 fL (ref 80.0–100.0)
Platelets: 175 10*3/uL (ref 150–440)
RBC: 4.93 MIL/uL (ref 3.80–5.20)
RDW: 14.2 % (ref 11.5–14.5)
WBC: 5.1 10*3/uL (ref 3.6–11.0)

## 2015-02-19 ENCOUNTER — Ambulatory Visit
Admission: RE | Admit: 2015-02-19 | Discharge: 2015-02-19 | Disposition: A | Discharge status: Home / Self Care | Payer: Commercial Managed Care - HMO | Source: Ambulatory Visit | Attending: Radiation Oncology | Admitting: Radiation Oncology

## 2015-02-19 DIAGNOSIS — C779 Secondary and unspecified malignant neoplasm of lymph node, unspecified: Secondary | ICD-10-CM | POA: Diagnosis not present

## 2015-02-19 DIAGNOSIS — Z510 Encounter for antineoplastic radiation therapy: Secondary | ICD-10-CM | POA: Diagnosis not present

## 2015-02-19 DIAGNOSIS — C50912 Malignant neoplasm of unspecified site of left female breast: Secondary | ICD-10-CM | POA: Diagnosis not present

## 2015-02-19 DIAGNOSIS — Z170 Estrogen receptor positive status [ER+]: Secondary | ICD-10-CM | POA: Diagnosis not present

## 2015-02-20 ENCOUNTER — Ambulatory Visit: Payer: Commercial Managed Care - HMO

## 2015-02-20 ENCOUNTER — Ambulatory Visit
Admission: RE | Admit: 2015-02-20 | Discharge: 2015-02-20 | Disposition: A | Discharge status: Home / Self Care | Payer: Commercial Managed Care - HMO | Source: Ambulatory Visit | Attending: Radiation Oncology | Admitting: Radiation Oncology

## 2015-02-20 DIAGNOSIS — C50912 Malignant neoplasm of unspecified site of left female breast: Secondary | ICD-10-CM | POA: Diagnosis not present

## 2015-02-20 DIAGNOSIS — Z170 Estrogen receptor positive status [ER+]: Secondary | ICD-10-CM | POA: Diagnosis not present

## 2015-02-20 DIAGNOSIS — Z510 Encounter for antineoplastic radiation therapy: Secondary | ICD-10-CM | POA: Diagnosis not present

## 2015-02-20 DIAGNOSIS — C50212 Malignant neoplasm of upper-inner quadrant of left female breast: Secondary | ICD-10-CM | POA: Diagnosis not present

## 2015-02-20 DIAGNOSIS — C779 Secondary and unspecified malignant neoplasm of lymph node, unspecified: Secondary | ICD-10-CM | POA: Diagnosis not present

## 2015-02-23 ENCOUNTER — Ambulatory Visit: Payer: Commercial Managed Care - HMO

## 2015-02-23 DIAGNOSIS — C50912 Malignant neoplasm of unspecified site of left female breast: Secondary | ICD-10-CM | POA: Diagnosis not present

## 2015-02-23 DIAGNOSIS — Z170 Estrogen receptor positive status [ER+]: Secondary | ICD-10-CM | POA: Diagnosis not present

## 2015-02-23 DIAGNOSIS — C779 Secondary and unspecified malignant neoplasm of lymph node, unspecified: Secondary | ICD-10-CM | POA: Diagnosis not present

## 2015-02-23 DIAGNOSIS — Z510 Encounter for antineoplastic radiation therapy: Secondary | ICD-10-CM | POA: Diagnosis not present

## 2015-02-23 DIAGNOSIS — C50212 Malignant neoplasm of upper-inner quadrant of left female breast: Secondary | ICD-10-CM | POA: Diagnosis not present

## 2015-02-24 ENCOUNTER — Ambulatory Visit
Admission: RE | Admit: 2015-02-24 | Discharge: 2015-02-24 | Disposition: A | Discharge status: Home / Self Care | Payer: Commercial Managed Care - HMO | Source: Ambulatory Visit | Attending: Radiation Oncology | Admitting: Radiation Oncology

## 2015-02-24 ENCOUNTER — Ambulatory Visit: Payer: Commercial Managed Care - HMO

## 2015-02-25 ENCOUNTER — Ambulatory Visit
Admission: RE | Admit: 2015-02-25 | Discharge: 2015-02-25 | Disposition: A | Discharge status: Home / Self Care | Payer: Commercial Managed Care - HMO | Source: Ambulatory Visit | Attending: Radiation Oncology | Admitting: Radiation Oncology

## 2015-02-25 DIAGNOSIS — C50212 Malignant neoplasm of upper-inner quadrant of left female breast: Secondary | ICD-10-CM | POA: Diagnosis not present

## 2015-02-25 DIAGNOSIS — Z170 Estrogen receptor positive status [ER+]: Secondary | ICD-10-CM | POA: Diagnosis not present

## 2015-02-25 DIAGNOSIS — Z510 Encounter for antineoplastic radiation therapy: Secondary | ICD-10-CM | POA: Diagnosis not present

## 2015-02-25 DIAGNOSIS — C50912 Malignant neoplasm of unspecified site of left female breast: Secondary | ICD-10-CM | POA: Diagnosis not present

## 2015-02-25 DIAGNOSIS — C779 Secondary and unspecified malignant neoplasm of lymph node, unspecified: Secondary | ICD-10-CM | POA: Diagnosis not present

## 2015-02-26 ENCOUNTER — Ambulatory Visit: Payer: Commercial Managed Care - HMO

## 2015-02-26 ENCOUNTER — Ambulatory Visit
Admission: RE | Admit: 2015-02-26 | Discharge: 2015-02-26 | Disposition: A | Discharge status: Home / Self Care | Payer: Commercial Managed Care - HMO | Source: Ambulatory Visit | Attending: Radiation Oncology | Admitting: Radiation Oncology

## 2015-02-27 ENCOUNTER — Ambulatory Visit
Admission: RE | Admit: 2015-02-27 | Discharge: 2015-02-27 | Disposition: A | Discharge status: Home / Self Care | Payer: Commercial Managed Care - HMO | Source: Ambulatory Visit | Attending: Radiation Oncology | Admitting: Radiation Oncology

## 2015-02-27 ENCOUNTER — Ambulatory Visit: Payer: Commercial Managed Care - HMO

## 2015-03-02 ENCOUNTER — Ambulatory Visit
Admission: RE | Admit: 2015-03-02 | Discharge: 2015-03-02 | Disposition: A | Discharge status: Home / Self Care | Payer: Commercial Managed Care - HMO | Source: Ambulatory Visit | Attending: Radiation Oncology | Admitting: Radiation Oncology

## 2015-03-02 DIAGNOSIS — Z510 Encounter for antineoplastic radiation therapy: Secondary | ICD-10-CM | POA: Diagnosis not present

## 2015-03-02 DIAGNOSIS — C779 Secondary and unspecified malignant neoplasm of lymph node, unspecified: Secondary | ICD-10-CM | POA: Diagnosis not present

## 2015-03-02 DIAGNOSIS — Z170 Estrogen receptor positive status [ER+]: Secondary | ICD-10-CM | POA: Diagnosis not present

## 2015-03-02 DIAGNOSIS — C50912 Malignant neoplasm of unspecified site of left female breast: Secondary | ICD-10-CM | POA: Diagnosis not present

## 2015-03-03 ENCOUNTER — Ambulatory Visit: Payer: Commercial Managed Care - HMO

## 2015-03-03 ENCOUNTER — Ambulatory Visit
Admission: RE | Admit: 2015-03-03 | Discharge: 2015-03-03 | Disposition: A | Discharge status: Home / Self Care | Payer: Commercial Managed Care - HMO | Source: Ambulatory Visit | Attending: Radiation Oncology | Admitting: Radiation Oncology

## 2015-03-03 DIAGNOSIS — Z170 Estrogen receptor positive status [ER+]: Secondary | ICD-10-CM | POA: Diagnosis not present

## 2015-03-03 DIAGNOSIS — C779 Secondary and unspecified malignant neoplasm of lymph node, unspecified: Secondary | ICD-10-CM | POA: Diagnosis not present

## 2015-03-03 DIAGNOSIS — C50912 Malignant neoplasm of unspecified site of left female breast: Secondary | ICD-10-CM | POA: Diagnosis not present

## 2015-03-03 DIAGNOSIS — Z510 Encounter for antineoplastic radiation therapy: Secondary | ICD-10-CM | POA: Diagnosis not present

## 2015-03-04 ENCOUNTER — Ambulatory Visit
Admission: RE | Admit: 2015-03-04 | Discharge: 2015-03-04 | Disposition: A | Discharge status: Home / Self Care | Payer: Commercial Managed Care - HMO | Source: Ambulatory Visit | Attending: Radiation Oncology | Admitting: Radiation Oncology

## 2015-03-04 ENCOUNTER — Ambulatory Visit: Payer: Commercial Managed Care - HMO

## 2015-03-04 DIAGNOSIS — Z170 Estrogen receptor positive status [ER+]: Secondary | ICD-10-CM | POA: Diagnosis not present

## 2015-03-04 DIAGNOSIS — C50212 Malignant neoplasm of upper-inner quadrant of left female breast: Secondary | ICD-10-CM | POA: Diagnosis not present

## 2015-03-04 DIAGNOSIS — Z510 Encounter for antineoplastic radiation therapy: Secondary | ICD-10-CM | POA: Diagnosis not present

## 2015-03-04 DIAGNOSIS — C50912 Malignant neoplasm of unspecified site of left female breast: Secondary | ICD-10-CM | POA: Diagnosis not present

## 2015-03-04 DIAGNOSIS — C779 Secondary and unspecified malignant neoplasm of lymph node, unspecified: Secondary | ICD-10-CM | POA: Diagnosis not present

## 2015-03-05 ENCOUNTER — Ambulatory Visit: Payer: Commercial Managed Care - HMO

## 2015-03-05 ENCOUNTER — Ambulatory Visit
Admission: RE | Admit: 2015-03-05 | Discharge: 2015-03-05 | Disposition: A | Discharge status: Home / Self Care | Payer: Commercial Managed Care - HMO | Source: Ambulatory Visit | Attending: Radiation Oncology | Admitting: Radiation Oncology

## 2015-03-05 DIAGNOSIS — Z170 Estrogen receptor positive status [ER+]: Secondary | ICD-10-CM | POA: Diagnosis not present

## 2015-03-05 DIAGNOSIS — C779 Secondary and unspecified malignant neoplasm of lymph node, unspecified: Secondary | ICD-10-CM | POA: Diagnosis not present

## 2015-03-05 DIAGNOSIS — Z510 Encounter for antineoplastic radiation therapy: Secondary | ICD-10-CM | POA: Diagnosis not present

## 2015-03-05 DIAGNOSIS — C50912 Malignant neoplasm of unspecified site of left female breast: Secondary | ICD-10-CM | POA: Diagnosis not present

## 2015-03-06 ENCOUNTER — Ambulatory Visit: Payer: Commercial Managed Care - HMO

## 2015-03-06 ENCOUNTER — Ambulatory Visit
Admission: RE | Admit: 2015-03-06 | Discharge: 2015-03-06 | Disposition: A | Discharge status: Home / Self Care | Payer: Commercial Managed Care - HMO | Source: Ambulatory Visit | Attending: Radiation Oncology | Admitting: Radiation Oncology

## 2015-03-06 DIAGNOSIS — C779 Secondary and unspecified malignant neoplasm of lymph node, unspecified: Secondary | ICD-10-CM | POA: Diagnosis not present

## 2015-03-06 DIAGNOSIS — C50912 Malignant neoplasm of unspecified site of left female breast: Secondary | ICD-10-CM | POA: Diagnosis not present

## 2015-03-06 DIAGNOSIS — Z170 Estrogen receptor positive status [ER+]: Secondary | ICD-10-CM | POA: Diagnosis not present

## 2015-03-06 DIAGNOSIS — Z510 Encounter for antineoplastic radiation therapy: Secondary | ICD-10-CM | POA: Diagnosis not present

## 2015-03-09 ENCOUNTER — Ambulatory Visit: Payer: Commercial Managed Care - HMO

## 2015-03-09 ENCOUNTER — Ambulatory Visit
Admission: RE | Admit: 2015-03-09 | Discharge: 2015-03-09 | Disposition: A | Discharge status: Home / Self Care | Payer: Commercial Managed Care - HMO | Source: Ambulatory Visit | Attending: Radiation Oncology | Admitting: Radiation Oncology

## 2015-03-09 DIAGNOSIS — Z510 Encounter for antineoplastic radiation therapy: Secondary | ICD-10-CM | POA: Diagnosis not present

## 2015-03-09 DIAGNOSIS — C779 Secondary and unspecified malignant neoplasm of lymph node, unspecified: Secondary | ICD-10-CM | POA: Diagnosis not present

## 2015-03-09 DIAGNOSIS — Z170 Estrogen receptor positive status [ER+]: Secondary | ICD-10-CM | POA: Diagnosis not present

## 2015-03-09 DIAGNOSIS — C50912 Malignant neoplasm of unspecified site of left female breast: Secondary | ICD-10-CM | POA: Diagnosis not present

## 2015-03-10 ENCOUNTER — Ambulatory Visit
Admission: RE | Admit: 2015-03-10 | Discharge: 2015-03-10 | Disposition: A | Discharge status: Home / Self Care | Payer: Commercial Managed Care - HMO | Source: Ambulatory Visit | Attending: Radiation Oncology | Admitting: Radiation Oncology

## 2015-03-10 ENCOUNTER — Other Ambulatory Visit: Payer: Self-pay

## 2015-03-10 ENCOUNTER — Ambulatory Visit: Payer: Commercial Managed Care - HMO

## 2015-03-10 DIAGNOSIS — C50912 Malignant neoplasm of unspecified site of left female breast: Secondary | ICD-10-CM | POA: Diagnosis not present

## 2015-03-10 DIAGNOSIS — C50919 Malignant neoplasm of unspecified site of unspecified female breast: Secondary | ICD-10-CM

## 2015-03-10 DIAGNOSIS — C779 Secondary and unspecified malignant neoplasm of lymph node, unspecified: Secondary | ICD-10-CM | POA: Diagnosis not present

## 2015-03-10 DIAGNOSIS — Z170 Estrogen receptor positive status [ER+]: Secondary | ICD-10-CM | POA: Diagnosis not present

## 2015-03-10 DIAGNOSIS — Z510 Encounter for antineoplastic radiation therapy: Secondary | ICD-10-CM | POA: Diagnosis not present

## 2015-03-11 ENCOUNTER — Other Ambulatory Visit: Payer: Self-pay | Admitting: Hematology and Oncology

## 2015-03-12 ENCOUNTER — Inpatient Hospital Stay: Payer: Commercial Managed Care - HMO | Attending: Hematology and Oncology | Admitting: Hematology and Oncology

## 2015-03-12 ENCOUNTER — Encounter: Payer: Self-pay | Admitting: Hematology and Oncology

## 2015-03-12 ENCOUNTER — Inpatient Hospital Stay: Payer: Commercial Managed Care - HMO

## 2015-03-12 VITALS — BP 151/82 | HR 86 | Temp 96.0°F | Resp 18 | Ht 68.0 in | Wt 213.2 lb

## 2015-03-12 DIAGNOSIS — C50912 Malignant neoplasm of unspecified site of left female breast: Secondary | ICD-10-CM | POA: Diagnosis not present

## 2015-03-12 DIAGNOSIS — M810 Age-related osteoporosis without current pathological fracture: Secondary | ICD-10-CM

## 2015-03-12 DIAGNOSIS — Z79899 Other long term (current) drug therapy: Secondary | ICD-10-CM | POA: Insufficient documentation

## 2015-03-12 DIAGNOSIS — C50919 Malignant neoplasm of unspecified site of unspecified female breast: Secondary | ICD-10-CM

## 2015-03-12 DIAGNOSIS — Z170 Estrogen receptor positive status [ER+]: Secondary | ICD-10-CM | POA: Diagnosis not present

## 2015-03-12 DIAGNOSIS — R160 Hepatomegaly, not elsewhere classified: Secondary | ICD-10-CM | POA: Insufficient documentation

## 2015-03-12 DIAGNOSIS — F17210 Nicotine dependence, cigarettes, uncomplicated: Secondary | ICD-10-CM

## 2015-03-12 DIAGNOSIS — M8580 Other specified disorders of bone density and structure, unspecified site: Secondary | ICD-10-CM

## 2015-03-12 DIAGNOSIS — K439 Ventral hernia without obstruction or gangrene: Secondary | ICD-10-CM | POA: Insufficient documentation

## 2015-03-12 DIAGNOSIS — I10 Essential (primary) hypertension: Secondary | ICD-10-CM | POA: Insufficient documentation

## 2015-03-12 DIAGNOSIS — Z7984 Long term (current) use of oral hypoglycemic drugs: Secondary | ICD-10-CM | POA: Diagnosis not present

## 2015-03-12 DIAGNOSIS — E119 Type 2 diabetes mellitus without complications: Secondary | ICD-10-CM | POA: Insufficient documentation

## 2015-03-12 HISTORY — DX: Age-related osteoporosis without current pathological fracture: M81.0

## 2015-03-12 LAB — COMPREHENSIVE METABOLIC PANEL
ALT: 24 U/L (ref 14–54)
AST: 22 U/L (ref 15–41)
Albumin: 4.2 g/dL (ref 3.5–5.0)
Alkaline Phosphatase: 75 U/L (ref 38–126)
Anion gap: 8 (ref 5–15)
BUN: 14 mg/dL (ref 6–20)
CO2: 26 mmol/L (ref 22–32)
Calcium: 8.9 mg/dL (ref 8.9–10.3)
Chloride: 104 mmol/L (ref 101–111)
Creatinine, Ser: 0.86 mg/dL (ref 0.44–1.00)
GFR calc Af Amer: >60 mL/min (ref >60)
GFR calc non Af Amer: >60 mL/min (ref >60)
Glucose, Bld: 111 mg/dL — ABNORMAL HIGH (ref 65–99)
Potassium: 4.2 mmol/L (ref 3.5–5.1)
Sodium: 138 mmol/L (ref 135–145)
Total Bilirubin: 0.5 mg/dL (ref 0.3–1.2)
Total Protein: 7.4 g/dL (ref 6.5–8.1)

## 2015-03-12 LAB — CBC WITH DIFFERENTIAL/PLATELET
Basophils Absolute: 0.1 10*3/uL (ref 0–0.1)
Basophils Relative: 1 %
Eosinophils Absolute: 0.2 10*3/uL (ref 0–0.7)
Eosinophils Relative: 5 %
HCT: 44.2 % (ref 35.0–47.0)
Hemoglobin: 14.3 g/dL (ref 12.0–16.0)
Lymphocytes Relative: 22 %
Lymphs Abs: 1.1 10*3/uL (ref 1.0–3.6)
MCH: 26.3 pg (ref 26.0–34.0)
MCHC: 32.3 g/dL (ref 32.0–36.0)
MCV: 81.5 fL (ref 80.0–100.0)
Monocytes Absolute: 0.4 10*3/uL (ref 0.2–0.9)
Monocytes Relative: 8 %
Neutro Abs: 3.1 10*3/uL (ref 1.4–6.5)
Neutrophils Relative %: 64 %
Platelets: 187 10*3/uL (ref 150–440)
RBC: 5.42 MIL/uL — ABNORMAL HIGH (ref 3.80–5.20)
RDW: 14.1 % (ref 11.5–14.5)
WBC: 4.9 10*3/uL (ref 3.6–11.0)

## 2015-03-12 MED ORDER — LETROZOLE 2.5 MG PO TABS: 2.5000 mg | ORAL_TABLET | Freq: Every day | ORAL | Status: DC

## 2015-03-12 NOTE — Progress Notes (Signed)
[place] [facility] day:  03/12/2015   Chief Complaint: Donna Riley is a 54 y.o. female with stage IIA left breast cancer status post completion of radiation who is seen for assessment prior to initiation of hormonal therapy.  HPI: The patient was last seen in the medical oncology clinic on 12/09/2014.  At that time, she received cycle #4 TC.  She then followed up with radiation oncology.  She received 50.4 Gy to the left breast from 01/12/2015 until 02/23/2015.  She tolerated radiation well with very little side effects.  She does note that her skin turned dark.  She also experienced a flare of hidranditis suppurtiva.  She was treated with topical antibiotics.  Symptomatically, she feels is doing well.  She voices no complaint.   Past Medical History  Diagnosis Date  . Prediabetes   . Elevated LFTs   . Positive PPD, treated   . Hypertension   . Arthritis   . Breast cancer     left    Past Surgical History  Procedure Laterality Date  . Cholecystectomy    . Tubal ligation Bilateral   . Breast lumpectomy with needle localization and axillary sentinel lymph node bx Left   . Breast biopsy Left     Family History  Problem Relation Age of Onset  . Cancer Mother     Pancreatic  . Cancer Father     Throat  . Cancer Maternal Grandmother     Breast    Social History:  reports that she has been smoking Cigarettes.  She has a 57 pack-year smoking history. She does not have any smokeless tobacco history on file. She reports that she drinks alcohol. She reports that she does not use illicit drugs.  The patient is accompanied by her son today.  Allergies: No Known Allergies  Current Medications: Current Outpatient Prescriptions  Medication Sig Dispense Refill  . amLODipine (NORVASC) 5 MG tablet     . BAYER BREEZE 2 TEST DISK     . clindamycin (CLEOCIN-T) 1 % lotion Apply topically 2 (two) times daily. 60 mL 0  . dexamethasone (DECADRON) 4  MG tablet Take 2 tablets by mouth twice a day on the day before and the day after chemotherapy 24 tablet 0  . HYDROcodone-acetaminophen (NORCO) 5-325 MG per tablet Take 1 tablet by mouth every 6 (six) hours as needed for moderate pain. 30 tablet 0  . lidocaine (XYLOCAINE) 4 % external solution Apply topically as needed. 50 mL 0  . lisinopril (PRINIVIL,ZESTRIL) 20 MG tablet Take 20 mg by mouth daily.    . metFORMIN (GLUCOPHAGE) 500 MG tablet Take 500 mg by mouth daily.    Marland Kitchen MICROLET LANCETS MISC     . ondansetron (ZOFRAN) 8 MG tablet Take 1 tablet (8 mg total) by mouth every 8 (eight) hours as needed for nausea or vomiting. 20 tablet 0  . silver sulfADIAZINE (SILVADENE) 1 % cream Apply 1 application topically 2 (two) times daily. 50 g 0  . sucralfate (CARAFATE) 1 G tablet Take 1 tablet (1 g total) by mouth 4 (four) times daily -  with meals and at bedtime. 90 tablet 3  . sulfamethoxazole-trimethoprim (BACTRIM DS,SEPTRA DS) 800-160 MG per tablet Take 1 tablet by mouth 2 (two) times daily. 28 tablet 0   No current facility-administered medications for this visit.    Review of Systems:  GENERAL: Feels good. Active. No fevers, sweats or weight loss. PERFORMANCE STATUS (ECOG): 0 HEENT:  No visual changes, runny nose, sore throat, mouth sores or tenderness. Lungs: No shortness of breath or cough. No hemoptysis. Cardiac: No chest pain, palpitations, orthopnea, or PND. BREAST: Left breast darker post radiation. GI: No nausea, vomiting, diarrhea, constipation, melena or hematochezia. GU: No urgency, frequency, dysuria, or hematuria. Musculoskeletal: No back pain. No joint pain. No muscle tenderness. Extremities: No pain or swelling. Skin: No skin changes, rashes or ulcers. Neuro:No headache, numbness or weakness, balance or coordination issues. Endocrine: No diabetes, thyroid issues, hot flashes or night sweats. Psych: No mood changes, depression or anxiety. Pain: No focal  pain. Review of systems: All other systems reviewed and found to be negative.  Physical Exam: Blood pressure 151/82, pulse 86, temperature 96 F (35.6 C), temperature source Tympanic, resp. rate 18, height _0  (1.727 m), weight 213 lb 3 oz (96.7 kg). GENERAL: Well developed, well nourished, sitting comfortably in the exam room in no acute distress. MENTAL STATUS: Alert and oriented to person, place and time. HEAD: Long dark brown brown wig in a pony tail. Normocephalic, atraumatic, face symmetric, no Cushingoid features. EYES: Brown eyes. Pupils equal round and reactive to light and accomodation. No conjunctivitis or scleral icterus. ENT: Oropharynx clear without lesion. Tongue normal. Mucous membranes moist.  RESPIRATORY: Clear to auscultation without rales, wheezes or rhonchi. CARDIOVASCULAR: Regular rate and rhythm without murmur, rub or gallop. BREASTS:  Right breast without masses, skin changes or nipple discharge.  Left breast with moderate hyperpigmentation.  Post surgical and post radiation changes with edema (pronounced lower inner quadrant). ABDOMEN: Ventral hernia.  Soft, non-tender, with active bowel sounds, and no hepatosplenomegaly. No masses. SKIN:  Slight left axillary fullness associated with known hidradenitis suppurativa.  No erythema or induration. No rashes, ulcers or lesions. EXTREMITIES: No edema, no skin discoloration or tenderness. No palpable cords. LYMPH NODES: No palpable cervical, supraclavicular, axillary or inguinal adenopathy  NEUROLOGICAL: Unremarkable.  PSYCH: Appropriate.  No visits with results within 3 Day(s) from this visit. Latest known visit with results is:  Appointment on 02/18/2015  Component Date Value Ref Range Status  . WBC 02/18/2015 5.1  3.6 - 11.0 K/uL Final  . RBC 02/18/2015 4.93  3.80 - 5.20 MIL/uL Final  . Hemoglobin 02/18/2015 13.3  12.0 - 16.0 g/dL Final  . HCT 02/18/2015 41.0  35.0 - 47.0 % Final  . MCV  02/18/2015 83.1  80.0 - 100.0 fL Final  . MCH 02/18/2015 27.0  26.0 - 34.0 pg Final  . MCHC 02/18/2015 32.5  32.0 - 36.0 g/dL Final  . RDW 02/18/2015 14.2  11.5 - 14.5 % Final  . Platelets 02/18/2015 175  150 - 440 K/uL Final    Assessment:  Donna Riley is a 54 y.o. African American female with stage IIA left breast cancer status post partial mastectomy and sentinel lymph node biopsy on 08/27/2014. 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 There was lymphovascular invasion. One of 2 sentinel lymph nodes were positive (focus of 2.8 mm). Tumor was > 90% ER positive, > 90% PR positive, and Her2/neu negative. Pathologic stage was [id number].  Bone scan on 09/16/2014 revealed abnormal focal uptake at the level of right L3 pedicle and L5 vertebral body. Lumbar spine MRI on 09/27/2014 revealed no evidence of metastatic disease with lower lumbar facet arthritis. Abdominal and pelvic CT scan on 09/24/2014 revealed hepatomegaly and no evidence of metastatic disease.   Bone density study on 09/15/2014 revealed a  T-score of -2.1 at L1-L4 (osteopenia).  She received 4 cycles of Taxotere and Cytoxan (09/29/2014 - 12/09/2014) with Neulasta support. She received 50.4 Gy to the left breast from 01/12/2015 until 02/23/2015.  Symptomatically, she is doing well.  Exam reveals left breast post radiation changes.  Plan: 1.  Labs today: CBC with diff, CMP.. 2.  Review radiation therapy records- done. 3.  Discuss hormonal therapy (Femara) x 5 years.  Side effects reviewed.  Information provided. 4.  Rx:  Femara 2.5 mg po q day; dis 30 day supply with 3 refills. 5.  Discuss calcium 1200 mg a day and vitamin D 800 IU a day. 6.  Referral to surgery for ventral hernia. 7.  RTC in 1 month for MD appointment and labs (LFTs).   Lequita Asal, MD  03/12/2015, 9:48 AM

## 2015-03-12 NOTE — Progress Notes (Signed)
Patient is here for follow-up of breast cancer. Patient completed radiation treatments on Tuesday. She states that overall she is doing well and offers no complaints.

## 2015-03-17 ENCOUNTER — Telehealth: Payer: Self-pay | Admitting: *Deleted

## 2015-03-17 NOTE — Telephone Encounter (Signed)
Per Dr Mike Gip, pt is to stop letrozole and call us back once her pain subsides and we will start her on arimidex. She can take ibuprofen for pain. I called patient to discuss this and she repeated it back to me

## 2015-03-17 NOTE — Telephone Encounter (Addendum)
Called answering service to report that she is having "really bad" boney pains and that her toes are numb. Asking for pain medicine. (She was started on Femara 10/13)

## 2015-03-25 ENCOUNTER — Encounter: Payer: Self-pay | Admitting: Internal Medicine

## 2015-03-25 DIAGNOSIS — I70219 Atherosclerosis of native arteries of extremities with intermittent claudication, unspecified extremity: Secondary | ICD-10-CM | POA: Insufficient documentation

## 2015-03-25 DIAGNOSIS — I10 Essential (primary) hypertension: Secondary | ICD-10-CM | POA: Insufficient documentation

## 2015-03-25 DIAGNOSIS — C50912 Malignant neoplasm of unspecified site of left female breast: Secondary | ICD-10-CM | POA: Insufficient documentation

## 2015-03-25 DIAGNOSIS — E119 Type 2 diabetes mellitus without complications: Secondary | ICD-10-CM | POA: Insufficient documentation

## 2015-03-25 DIAGNOSIS — K439 Ventral hernia without obstruction or gangrene: Secondary | ICD-10-CM | POA: Insufficient documentation

## 2015-03-30 ENCOUNTER — Telehealth: Payer: Self-pay | Admitting: *Deleted

## 2015-03-30 MED ORDER — ANASTROZOLE 1 MG PO TABS: 1.0000 mg | ORAL_TABLET | Freq: Every day | ORAL | Status: DC

## 2015-03-30 NOTE — Telephone Encounter (Signed)
Called to report that her boney pains have gone. Per last PC note we were going to start her on Arimidex. She also is reporting that she has 2 nockle sized knots under her arm where the lymph nodes were removed

## 2015-03-30 NOTE — Telephone Encounter (Signed)
Notified that her med was sent to pharmacy and appt given for 11/10 to see md

## 2015-04-01 DIAGNOSIS — K439 Ventral hernia without obstruction or gangrene: Secondary | ICD-10-CM | POA: Diagnosis not present

## 2015-04-06 ENCOUNTER — Encounter
Admission: RE | Admit: 2015-04-06 | Discharge: 2015-04-06 | Disposition: A | Discharge status: Home / Self Care | Payer: Commercial Managed Care - HMO | Source: Ambulatory Visit | Attending: Surgery | Admitting: Surgery

## 2015-04-06 DIAGNOSIS — I10 Essential (primary) hypertension: Secondary | ICD-10-CM | POA: Diagnosis not present

## 2015-04-06 DIAGNOSIS — Z01818 Encounter for other preprocedural examination: Secondary | ICD-10-CM | POA: Insufficient documentation

## 2015-04-06 HISTORY — DX: Type 2 diabetes mellitus without complications: E11.9

## 2015-04-06 NOTE — Patient Instructions (Signed)
  Your procedure is scheduled on: Friday Nov. 11, 2016. Report to Same Day Surgery. To find out your arrival time please call [phone] between 1PM - 3PM on Thursday Nov. 10, 2016.  Remember: Instructions that are not followed completely may result in serious medical risk, up to and including death, or upon the discretion of your surgeon and anesthesiologist your surgery may need to be rescheduled.    _x___ 1. Do not eat food or drink liquids after midnight. No gum chewing or hard candies.     _x___ 2. No Alcohol for 24 hours before or after surgery.   ____ 3. Bring all medications with you on the day of surgery if instructed.    __x__ 4. Notify your doctor if there is any change in your medical condition     (cold, fever, infections).     Do not wear jewelry, make-up, hairpins, clips or nail polish.  Do not wear lotions, powders, or perfumes. You may wear deodorant.  Do not shave 48 hours prior to surgery. Men may shave face and neck.  Do not bring valuables to the hospital.    [facility] is not responsible for any belongings or valuables.               Contacts, dentures or bridgework may not be worn into surgery.  Leave your suitcase in the car. After surgery it may be brought to your room.  For patients admitted to the hospital, discharge time is determined by your treatment team.   Patients discharged the day of surgery will not be allowed to drive home.    Please read over the following fact sheets that you were given:   [facility] Preparing for Surgery  _x___ Take these medicines the morning of surgery with A SIP OF WATER:    1. amLODipine (NORVASC)  2. lisinopril (PRINIVIL,ZESTRIL)     ____ Fleet Enema (as directed)   __x__ Use CHG Soap as directed  ____ Use inhalers on the day of surgery  __x__ Stop metformin 2 days prior to surgery    ____ Take 1/2 of usual insulin dose the night before surgery and none on the morning of surgery.   ____ Stop  Coumadin/Plavix/aspirin on does not apply.  __x__ Stop Anti-inflammatories on does not apply. Tylenol is ok to take for pain.   ____ Stop supplements until after surgery.    ____ Bring C-Pap to the hospital.

## 2015-04-09 ENCOUNTER — Inpatient Hospital Stay: Payer: Commercial Managed Care - HMO

## 2015-04-09 ENCOUNTER — Other Ambulatory Visit: Payer: Self-pay

## 2015-04-09 ENCOUNTER — Inpatient Hospital Stay: Payer: Commercial Managed Care - HMO | Attending: Hematology and Oncology | Admitting: Hematology and Oncology

## 2015-04-09 VITALS — BP 161/92 | HR 88 | Temp 99.1°F | Ht 67.0 in | Wt 210.1 lb

## 2015-04-09 DIAGNOSIS — C50912 Malignant neoplasm of unspecified site of left female breast: Secondary | ICD-10-CM | POA: Insufficient documentation

## 2015-04-09 DIAGNOSIS — I10 Essential (primary) hypertension: Secondary | ICD-10-CM | POA: Insufficient documentation

## 2015-04-09 DIAGNOSIS — Z79811 Long term (current) use of aromatase inhibitors: Secondary | ICD-10-CM

## 2015-04-09 DIAGNOSIS — Z170 Estrogen receptor positive status [ER+]: Secondary | ICD-10-CM | POA: Diagnosis not present

## 2015-04-09 DIAGNOSIS — F17210 Nicotine dependence, cigarettes, uncomplicated: Secondary | ICD-10-CM | POA: Insufficient documentation

## 2015-04-09 DIAGNOSIS — E119 Type 2 diabetes mellitus without complications: Secondary | ICD-10-CM | POA: Insufficient documentation

## 2015-04-09 DIAGNOSIS — Z79899 Other long term (current) drug therapy: Secondary | ICD-10-CM | POA: Diagnosis not present

## 2015-04-09 DIAGNOSIS — N951 Menopausal and female climacteric states: Secondary | ICD-10-CM

## 2015-04-09 DIAGNOSIS — Z923 Personal history of irradiation: Secondary | ICD-10-CM | POA: Diagnosis not present

## 2015-04-09 DIAGNOSIS — Z7984 Long term (current) use of oral hypoglycemic drugs: Secondary | ICD-10-CM | POA: Insufficient documentation

## 2015-04-09 LAB — COMPREHENSIVE METABOLIC PANEL
ALT: 27 U/L (ref 14–54)
AST: 29 U/L (ref 15–41)
Albumin: 4.2 g/dL (ref 3.5–5.0)
Alkaline Phosphatase: 77 U/L (ref 38–126)
Anion gap: 10 (ref 5–15)
BUN: 9 mg/dL (ref 6–20)
CO2: 25 mmol/L (ref 22–32)
Calcium: 9.4 mg/dL (ref 8.9–10.3)
Chloride: 103 mmol/L (ref 101–111)
Creatinine, Ser: 0.78 mg/dL (ref 0.44–1.00)
GFR calc Af Amer: >60 mL/min (ref >60)
GFR calc non Af Amer: >60 mL/min (ref >60)
Glucose, Bld: 87 mg/dL (ref 65–99)
Potassium: 3.7 mmol/L (ref 3.5–5.1)
Sodium: 138 mmol/L (ref 135–145)
Total Bilirubin: 0.3 mg/dL (ref 0.3–1.2)
Total Protein: 7.6 g/dL (ref 6.5–8.1)

## 2015-04-09 NOTE — Progress Notes (Signed)
[facility] day:  04/09/2015   Chief Complaint: MERCED Riley is a 54 y.o. 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  HPI: The patient was last seen in the medical oncology clinic on 03/12/2015.  At that time, she was seen prior to initiation of hormonal therapy. She was doing well.  She voiced no complaint.   She was started on Femara.  The patient called on 03/30/2015 secondary to diffuse bony pain. She also noted 2 knuckle size knots under her left arm area at the area of prior hidradenitis suppurativa.  Femara was switched to Arimidex.  The patient states that Arimidex she only has a little achiness in her upper arms and over her shoulders. She feels that it makes her tired. She did meet with Rochel Brome, MD who noted a cyst under her left arm. She scheduled for ventral surgery repair tomorrow  Past Medical History  Diagnosis Date  . Prediabetes   . Elevated LFTs   . Positive PPD, treated   . Hypertension   . Arthritis   . Breast cancer ([place])     left  . Diabetes mellitus without complication ([place]) 08/6566    Past Surgical History  Procedure Laterality Date  . Cholecystectomy    . Tubal ligation Bilateral   . Breast lumpectomy with needle localization and axillary sentinel lymph node bx Left   . Breast biopsy Left   . Esophagogastroduodenoscopy  2014    gastritis    Family History  Problem Relation Age of Onset  . Cancer Mother     Pancreatic  . Cancer Father     Throat  . Cancer Maternal Grandmother     Breast    Social History:  reports that she has been smoking Cigarettes.  She has a 19 pack-year smoking history. She does not have any smokeless tobacco history on file. She reports that she drinks alcohol. She reports that she does not use illicit drugs.  The patient is alone today.  Allergies: No Known Allergies  Current Medications: Current  Outpatient Prescriptions  Medication Sig Dispense Refill  . amLODipine (NORVASC) 5 MG tablet every morning.     Marland Kitchen anastrozole (ARIMIDEX) 1 MG tablet Take 1 tablet (1 mg total) by mouth daily. (Patient taking differently: Take 1 mg by mouth daily. Every am) 30 tablet 0  . BAYER BREEZE 2 TEST DISK     . Cholecalciferol (VITAMIN D3) 1000 UNITS CAPS Take 1 capsule by mouth every morning.    . clindamycin (CLEOCIN-T) 1 % lotion Apply topically 2 (two) times daily. 60 mL 0  . lisinopril (PRINIVIL,ZESTRIL) 20 MG tablet Take 20 mg by mouth daily. In am    . metFORMIN (GLUCOPHAGE) 500 MG tablet Take 500 mg by mouth daily. Every am    . MICROLET LANCETS MISC     . silver sulfADIAZINE (SILVADENE) 1 % cream Apply 1 application topically 2 (two) times daily. 50 g 0   No current facility-administered medications for this visit.    Review of Systems:  GENERAL: Feels a little tired. No fevers, sweats or weight loss. PERFORMANCE STATUS (ECOG): 0 HEENT: No visual changes, runny nose, sore throat, mouth sores or tenderness. Lungs: No shortness of breath or cough. No hemoptysis. Cardiac: No chest pain, palpitations, orthopnea, or PND. Breast: Left breast darker post radiation.  No lumps or erythema. GI: No nausea, vomiting, diarrhea,  constipation, melena or hematochezia. GU: No urgency, frequency, dysuria, or hematuria. Musculoskeletal: No back pain. No joint pain. No muscle tenderness. Extremities: No pain or swelling. Skin: No skin changes, rashes or ulcers. Neuro:No headache, numbness or weakness, balance or coordination issues. Endocrine: Vasomotor symptoms.  No diabetes, thyroid issues or night sweats. Psych: No mood changes, depression or anxiety. Pain: No focal pain. Review of systems: All other systems reviewed and found to be negative.  Physical Exam: Blood pressure 161/92, pulse 88, temperature 99.1 F (37.3 C), temperature source Oral, height $RemoveBefo'5\' 7"'[id number]$  (1.702 m), weight 210  lb 1.6 oz (95.3 kg). GENERAL: Well developed, well nourished, sitting comfortably in the exam room in no acute distress. MENTAL STATUS: Alert and oriented to person, place and time. HEAD: Long dark brown slightly curly hair. Normocephalic, atraumatic, face symmetric, no Cushingoid features. EYES: Brown eyes. Pupils equal round and reactive to light and accomodation. No conjunctivitis or scleral icterus. ENT: Oropharynx clear without lesion. Tongue normal. Mucous membranes moist.  RESPIRATORY: Clear to auscultation without rales, wheezes or rhonchi. CARDIOVASCULAR: Regular rate and rhythm without murmur, rub or gallop. BREASTS:  Right breast without masses, skin changes or nipple discharge.  Left breast with moderate hyperpigmentation.  Post surgical and post radiation changes with edema (pronounced lower inner quadrant). ABDOMEN: Ventral hernia.  Soft, non-tender, with active bowel sounds, and no hepatosplenomegaly. No masses. SKIN:  Slight left axillary fullness associated with known hidradenitis suppurativa.  Small cyst in left axilla.  No erythema or induration. No rashes, ulcers or lesions. EXTREMITIES: No edema, no skin discoloration or tenderness. No palpable cords. LYMPH NODES: No palpable cervical, supraclavicular, axillary or inguinal adenopathy  NEUROLOGICAL: Unremarkable.  PSYCH: Appropriate.  No visits with results within 3 Day(s) from this visit. Latest known visit with results is:  Appointment on 03/12/2015  Component Date Value Ref Range Status  . WBC 03/12/2015 4.9  3.6 - 11.0 K/uL Final  . RBC 03/12/2015 5.42* 3.80 - 5.20 MIL/uL Final  . Hemoglobin 03/12/2015 14.3  12.0 - 16.0 g/dL Final  . HCT 03/12/2015 44.2  35.0 - 47.0 % Final  . MCV 03/12/2015 81.5  80.0 - 100.0 fL Final  . MCH 03/12/2015 26.3  26.0 - 34.0 pg Final  . MCHC 03/12/2015 32.3  32.0 - 36.0 g/dL Final  . RDW 03/12/2015 14.1  11.5 - 14.5 % Final  . Platelets 03/12/2015 187  150 - 440 K/uL  Final  . Neutrophils Relative % 03/12/2015 64   Final  . Neutro Abs 03/12/2015 3.1  1.4 - 6.5 K/uL Final  . Lymphocytes Relative 03/12/2015 22   Final  . Lymphs Abs 03/12/2015 1.1  1.0 - 3.6 K/uL Final  . Monocytes Relative 03/12/2015 8   Final  . Monocytes Absolute 03/12/2015 0.4  0.2 - 0.9 K/uL Final  . Eosinophils Relative 03/12/2015 5   Final  . Eosinophils Absolute 03/12/2015 0.2  0 - 0.7 K/uL Final  . Basophils Relative 03/12/2015 1   Final  . Basophils Absolute 03/12/2015 0.1  0 - 0.1 K/uL Final  . Sodium 03/12/2015 138  135 - 145 mmol/L Final  . Potassium 03/12/2015 4.2  3.5 - 5.1 mmol/L Final  . Chloride 03/12/2015 104  101 - 111 mmol/L Final  . CO2 03/12/2015 26  22 - 32 mmol/L Final  . Glucose, Bld 03/12/2015 111* 65 - 99 mg/dL Final  . BUN 03/12/2015 14  6 - 20 mg/dL Final  . Creatinine, Ser 03/12/2015 0.86  0.44 - 1.00 mg/dL  Final  . Calcium 03/12/2015 8.9  8.9 - 10.3 mg/dL Final  . Total Protein 03/12/2015 7.4  6.5 - 8.1 g/dL Final  . Albumin 03/12/2015 4.2  3.5 - 5.0 g/dL Final  . AST 03/12/2015 22  15 - 41 U/L Final  . ALT 03/12/2015 24  14 - 54 U/L Final  . Alkaline Phosphatase 03/12/2015 75  38 - 126 U/L Final  . Total Bilirubin 03/12/2015 0.5  0.3 - 1.2 mg/dL Final  . GFR calc non Af Amer 03/12/2015 >60  >60 mL/min Final  . GFR calc Af Amer 03/12/2015 >60  >60 mL/min Final   Comment: (NOTE) The eGFR has been calculated using the CKD EPI equation. This calculation has not been validated in all clinical situations. eGFR's persistently <60 mL/min signify possible Chronic Kidney Disease.   . Anion gap 03/12/2015 8  5 - 15 Final    Assessment:  TWYLLA ARCENEAUX is a 54 y.o. African American female with stage IIA left breast cancer status post partial mastectomy and sentinel lymph node biopsy on 08/27/2014. 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 There was lymphovascular invasion. One of 2  sentinel lymph nodes were positive (focus of 2.8 mm). Tumor was > 90% ER positive, > 90% PR positive, and Her2/neu negative. Pathologic stage was T1bN1aM0.  Bone scan on 09/16/2014 revealed abnormal focal uptake at the level of right L3 pedicle and L5 vertebral body. Lumbar spine MRI on 09/27/2014 revealed no evidence of metastatic disease with lower lumbar facet arthritis. Abdominal and pelvic CT scan on 09/24/2014 revealed hepatomegaly and no evidence of metastatic disease.   Bone density study on 09/15/2014 revealed a T-score of -2.1 at L1-L4 (osteopenia).  She is on calcium and vitamin D.  She received 4 cycles of Taxotere and Cytoxan (09/29/2014 - 12/09/2014) with Neulasta support. She received 50.4 Gy to the left breast from 01/12/2015 until 02/23/2015.  She was started on Femara on 03/12/2015, but switched to Arimidex on 03/30/2015 secondary to diffuse joint aches.    Symptomatically, she is tolerating Arimidex well except for a little achiness in her upper extremities and mild vasomotor symptoms.  Exam reveals left breast post radiation changes.  She has a left axillary cyst secondary to hidradenitis suppurativa.  Plan: 1.  Labs today: CMP. 2.  Continue Arimidex. 3.  Information on Effexor.  Patient to call if wishes to start. 4.  RTC in 3 month for MD appointment and labs (CBC with diff, CMP, CA27.29).   Lequita Asal, MD  04/09/2015, 8:46 AM

## 2015-04-09 NOTE — Patient Instructions (Signed)
Venlafaxine extended-release capsules  What is this medicine?  VENLAFAXINE(VEN la fax een) is used to treat depression, anxiety and panic disorder.  This medicine may be used for other purposes; ask your health care provider or pharmacist if you have questions.  What should I tell my health care provider before I take this medicine?  They need to know if you have any of these conditions:  -bleeding disorders  -glaucoma  -heart disease  -high blood pressure  -high cholesterol  -kidney disease  -liver disease  -low levels of sodium in the blood  -mania or bipolar disorder  -seizures  -suicidal thoughts, plans, or attempt; a previous suicide attempt by you or a family  -take medicines that treat or prevent blood clots  -thyroid disease  -an unusual or allergic reaction to venlafaxine, desvenlafaxine, other medicines, foods, dyes, or preservatives  -pregnant or trying to get pregnant  -breast-feeding  How should I use this medicine?  Take this medicine by mouth with a full glass of water. Follow the directions on the prescription label. Do not cut, crush, or chew this medicine. Take it with food. If needed, the capsule may be carefully opened and the entire contents sprinkled on a spoonful of cool applesauce. Swallow the applesauce/pellet mixture right away without chewing and follow with a glass of water to ensure complete swallowing of the pellets. Try to take your medicine at about the same time each day. Do not take your medicine more often than directed. Do not stop taking this medicine suddenly except upon the advice of your doctor. Stopping this medicine too quickly may cause serious side effects or your condition may worsen.  A special MedGuide will be given to you by the pharmacist with each prescription and refill. Be sure to read this information carefully each time.  Talk to your pediatrician regarding the use of this medicine in children. Special care may be needed.  Overdosage: If you think you have taken  too much of this medicine contact a poison control center or emergency room at once.  NOTE: This medicine is only for you. Do not share this medicine with others.  What if I miss a dose?  If you miss a dose, take it as soon as you can. If it is almost time for your next dose, take only that dose. Do not take double or extra doses.  What may interact with this medicine?  Do not take this medicine with any of the following medications:  -certain medicines for fungal infections like fluconazole, itraconazole, ketoconazole, posaconazole, voriconazole  -cisapride  -desvenlafaxine  -dofetilide  -dronedarone  -duloxetine  -levomilnacipran  -linezolid  -MAOIs like Carbex, Eldepryl, Marplan, Nardil, and Parnate  -methylene blue (injected into a vein)  -milnacipran  -pimozide  -thioridazine  -ziprasidone  This medicine may also interact with the following medications:  -aspirin and aspirin-like medicines  -certain medicines for depression, anxiety, or psychotic disturbances  -certain medicines for migraine headaches like almotriptan, eletriptan, frovatriptan, naratriptan, rizatriptan, sumatriptan, zolmitriptan  -certain medicines for sleep  -certain medicines that treat or prevent blood clots like dalteparin, enoxaparin, warfarin  -cimetidine  -clozapine  -diuretics  -fentanyl  -furazolidone  -indinavir  -isoniazid  -lithium  -metoprolol  -NSAIDS, medicines for pain and inflammation, like ibuprofen or naproxen  -other medicines that prolong the QT interval (cause an abnormal heart rhythm)  -procarbazine  -rasagiline  -supplements like St. John's wort, kava kava, valerian  -tramadol  -tryptophan  This list may not describe all possible interactions.   Give your health care provider a list of all the medicines, herbs, non-prescription drugs, or dietary supplements you use. Also tell them if you smoke, drink alcohol, or use illegal drugs. Some items may interact with your medicine.  What should I watch for while using this  medicine?  Tell your doctor if your symptoms do not get better or if they get worse. Visit your doctor or health care professional for regular checks on your progress. Because it may take several weeks to see the full effects of this medicine, it is important to continue your treatment as prescribed by your doctor.  Patients and their families should watch out for new or worsening thoughts of suicide or depression. Also watch out for sudden changes in feelings such as feeling anxious, agitated, panicky, irritable, hostile, aggressive, impulsive, severely restless, overly excited and hyperactive, or not being able to sleep. If this happens, especially at the beginning of treatment or after a change in dose, call your health care professional.  This medicine can cause an increase in blood pressure. Check with your doctor for instructions on monitoring your blood pressure while taking this medicine.  You may get drowsy or dizzy. Do not drive, use machinery, or do anything that needs mental alertness until you know how this medicine affects you. Do not stand or sit up quickly, especially if you are an older patient. This reduces the risk of dizzy or fainting spells. Alcohol may interfere with the effect of this medicine. Avoid alcoholic drinks.  Your mouth may get dry. Chewing sugarless gum, sucking hard candy and drinking plenty of water will help. Contact your doctor if the problem does not go away or is severe.  What side effects may I notice from receiving this medicine?  Side effects that you should report to your doctor or health care professional as soon as possible:  -allergic reactions like skin rash, itching or hives, swelling of the face, lips, or tongue  -breathing problems  -changes in vision  -hallucination, loss of contact with reality  -seizures  -suicidal thoughts or other mood changes  -trouble passing urine or change in the amount of urine  -unusual bleeding or bruising  Side effects that usually do  not require medical attention (report to your doctor or health care professional if they continue or are bothersome):  -change in sex drive or performance  -constipation  -increased sweating  -loss of appetite  -nausea  -tremors  -weight loss  This list may not describe all possible side effects. Call your doctor for medical advice about side effects. You may report side effects to FDA at 1-800-FDA-1088.  Where should I keep my medicine?  Keep out of the reach of children.  Store at a controlled temperature between 20 and 25 degrees C (68 degrees and 77 degrees F), in a dry place. Throw away any unused medicine after the expiration date.  NOTE: This sheet is a summary. It may not cover all possible information. If you have questions about this medicine, talk to your doctor, pharmacist, or health care provider.     © 2016, Elsevier/Gold Standard. (2012-12-11 12:46:03)

## 2015-04-10 ENCOUNTER — Ambulatory Visit: Payer: Commercial Managed Care - HMO | Admitting: *Deleted

## 2015-04-10 ENCOUNTER — Encounter
Admission: RE | Disposition: A | Discharge status: Home / Self Care | Payer: Self-pay | Source: Ambulatory Visit | Attending: Surgery

## 2015-04-10 ENCOUNTER — Other Ambulatory Visit: Payer: Self-pay | Admitting: Internal Medicine

## 2015-04-10 ENCOUNTER — Encounter: Payer: Self-pay | Admitting: Anesthesiology

## 2015-04-10 ENCOUNTER — Encounter: Payer: Self-pay | Admitting: Hematology and Oncology

## 2015-04-10 ENCOUNTER — Ambulatory Visit
Admission: RE | Admit: 2015-04-10 | Discharge: 2015-04-10 | Disposition: A | Discharge status: Home / Self Care | Payer: Commercial Managed Care - HMO | Source: Ambulatory Visit | Attending: Surgery | Admitting: Surgery

## 2015-04-10 DIAGNOSIS — M1990 Unspecified osteoarthritis, unspecified site: Secondary | ICD-10-CM | POA: Insufficient documentation

## 2015-04-10 DIAGNOSIS — Z7984 Long term (current) use of oral hypoglycemic drugs: Secondary | ICD-10-CM | POA: Diagnosis not present

## 2015-04-10 DIAGNOSIS — I10 Essential (primary) hypertension: Secondary | ICD-10-CM | POA: Diagnosis not present

## 2015-04-10 DIAGNOSIS — Z801 Family history of malignant neoplasm of trachea, bronchus and lung: Secondary | ICD-10-CM | POA: Insufficient documentation

## 2015-04-10 DIAGNOSIS — Z808 Family history of malignant neoplasm of other organs or systems: Secondary | ICD-10-CM | POA: Diagnosis not present

## 2015-04-10 DIAGNOSIS — Z923 Personal history of irradiation: Secondary | ICD-10-CM | POA: Insufficient documentation

## 2015-04-10 DIAGNOSIS — Z800 Family history of malignant neoplasm of digestive organs: Secondary | ICD-10-CM | POA: Insufficient documentation

## 2015-04-10 DIAGNOSIS — Z8489 Family history of other specified conditions: Secondary | ICD-10-CM | POA: Diagnosis not present

## 2015-04-10 DIAGNOSIS — Z9012 Acquired absence of left breast and nipple: Secondary | ICD-10-CM | POA: Insufficient documentation

## 2015-04-10 DIAGNOSIS — Z79899 Other long term (current) drug therapy: Secondary | ICD-10-CM | POA: Insufficient documentation

## 2015-04-10 DIAGNOSIS — Z9049 Acquired absence of other specified parts of digestive tract: Secondary | ICD-10-CM | POA: Insufficient documentation

## 2015-04-10 DIAGNOSIS — Z853 Personal history of malignant neoplasm of breast: Secondary | ICD-10-CM | POA: Diagnosis not present

## 2015-04-10 DIAGNOSIS — E119 Type 2 diabetes mellitus without complications: Secondary | ICD-10-CM | POA: Insufficient documentation

## 2015-04-10 DIAGNOSIS — Z9221 Personal history of antineoplastic chemotherapy: Secondary | ICD-10-CM | POA: Diagnosis not present

## 2015-04-10 DIAGNOSIS — Z803 Family history of malignant neoplasm of breast: Secondary | ICD-10-CM | POA: Diagnosis not present

## 2015-04-10 DIAGNOSIS — K439 Ventral hernia without obstruction or gangrene: Secondary | ICD-10-CM | POA: Insufficient documentation

## 2015-04-10 DIAGNOSIS — F17200 Nicotine dependence, unspecified, uncomplicated: Secondary | ICD-10-CM | POA: Insufficient documentation

## 2015-04-10 HISTORY — PX: VENTRAL HERNIA REPAIR: SHX424

## 2015-04-10 LAB — GLUCOSE, CAPILLARY
Glucose-Capillary: 103 mg/dL — ABNORMAL HIGH (ref 65–99)
Glucose-Capillary: 110 mg/dL — ABNORMAL HIGH (ref 65–99)

## 2015-04-10 SURGERY — REPAIR, HERNIA, VENTRAL
Anesthesia: General | Wound class: Clean

## 2015-04-10 MED ORDER — PROPOFOL 10 MG/ML IV BOLUS
INTRAVENOUS | Status: DC | PRN
  Administered 2015-04-10: 140 mg via INTRAVENOUS
  Administered 2015-04-10: 50 mg via INTRAVENOUS

## 2015-04-10 MED ORDER — FAMOTIDINE 20 MG PO TABS
20.0000 mg | ORAL_TABLET | Freq: Once | ORAL | Status: AC
  Administered 2015-04-10: 20 mg via ORAL

## 2015-04-10 MED ORDER — BUPIVACAINE-EPINEPHRINE (PF) 0.5% -1:200000 IJ SOLN
INTRAMUSCULAR | Status: AC
  Filled 2015-04-10: qty 30

## 2015-04-10 MED ORDER — ONDANSETRON HCL 4 MG/2ML IJ SOLN
INTRAMUSCULAR | Status: DC | PRN
  Administered 2015-04-10: 4 mg via INTRAVENOUS

## 2015-04-10 MED ORDER — NEOSTIGMINE METHYLSULFATE 10 MG/10ML IV SOLN
INTRAVENOUS | Status: DC | PRN
  Administered 2015-04-10: 3 mg via INTRAVENOUS

## 2015-04-10 MED ORDER — LIDOCAINE HCL (PF) 1 % IJ SOLN
INTRAMUSCULAR | Status: AC
  Filled 2015-04-10: qty 2

## 2015-04-10 MED ORDER — BUPIVACAINE-EPINEPHRINE 0.5% -1:200000 IJ SOLN
INTRAMUSCULAR | Status: DC | PRN
  Administered 2015-04-10: 20 mL

## 2015-04-10 MED ORDER — CEFAZOLIN SODIUM-DEXTROSE 2-3 GM-% IV SOLR
2.0000 g | Freq: Once | INTRAVENOUS | Status: AC
  Administered 2015-04-10: 2 g via INTRAVENOUS

## 2015-04-10 MED ORDER — ROCURONIUM BROMIDE 100 MG/10ML IV SOLN
INTRAVENOUS | Status: DC | PRN
  Administered 2015-04-10: 30 mg via INTRAVENOUS
  Administered 2015-04-10: 10 mg via INTRAVENOUS

## 2015-04-10 MED ORDER — CEFAZOLIN SODIUM-DEXTROSE 2-3 GM-% IV SOLR
INTRAVENOUS | Status: AC
  Filled 2015-04-10: qty 50

## 2015-04-10 MED ORDER — SODIUM CHLORIDE 0.9 % IV SOLN
INTRAVENOUS | Status: DC
  Administered 2015-04-10: 11:00:00 via INTRAVENOUS

## 2015-04-10 MED ORDER — MIDAZOLAM HCL 2 MG/2ML IJ SOLN
INTRAMUSCULAR | Status: DC | PRN
  Administered 2015-04-10: 2 mg via INTRAVENOUS

## 2015-04-10 MED ORDER — GLYCOPYRROLATE 0.2 MG/ML IJ SOLN
INTRAMUSCULAR | Status: DC | PRN
  Administered 2015-04-10: .5 mg via INTRAVENOUS

## 2015-04-10 MED ORDER — DEXAMETHASONE SODIUM PHOSPHATE 4 MG/ML IJ SOLN
INTRAMUSCULAR | Status: DC | PRN
  Administered 2015-04-10: 10 mg via INTRAVENOUS

## 2015-04-10 MED ORDER — HYDROMORPHONE HCL 1 MG/ML IJ SOLN
INTRAMUSCULAR | Status: AC
  Administered 2015-04-10: 0.5 mg via INTRAVENOUS
  Filled 2015-04-10: qty 1

## 2015-04-10 MED ORDER — FENTANYL CITRATE (PF) 100 MCG/2ML IJ SOLN
INTRAMUSCULAR | Status: DC | PRN
Start: 2015-04-10 — End: 2015-04-10
  Administered 2015-04-10 (×3): 50 ug via INTRAVENOUS
  Administered 2015-04-10: 100 ug via INTRAVENOUS

## 2015-04-10 MED ORDER — OXYCODONE-ACETAMINOPHEN 5-325 MG PO TABS: 1.0000 | ORAL_TABLET | ORAL | Status: DC | PRN

## 2015-04-10 MED ORDER — ONDANSETRON HCL 4 MG/2ML IJ SOLN: 4.0000 mg | Freq: Once | INTRAMUSCULAR | Status: DC | PRN

## 2015-04-10 MED ORDER — HYDROMORPHONE HCL 1 MG/ML IJ SOLN
0.2500 mg | INTRAMUSCULAR | Status: DC | PRN
  Administered 2015-04-10 (×2): 0.5 mg via INTRAVENOUS

## 2015-04-10 MED ORDER — LIDOCAINE HCL (CARDIAC) 20 MG/ML IV SOLN
INTRAVENOUS | Status: DC | PRN
  Administered 2015-04-10: 100 mg via INTRAVENOUS

## 2015-04-10 MED ORDER — FAMOTIDINE 20 MG PO TABS
ORAL_TABLET | ORAL | Status: AC
  Administered 2015-04-10: 20 mg via ORAL
  Filled 2015-04-10: qty 1

## 2015-04-10 SURGICAL SUPPLY — 23 items
CANISTER SUCT 1200ML W/VALVE (MISCELLANEOUS) ×2 IMPLANT
CHLORAPREP W/TINT 26ML (MISCELLANEOUS) ×2 IMPLANT
DRAPE LAPAROTOMY 100X77 ABD (DRAPES) ×2 IMPLANT
GAUZE SPONGE 4X4 12PLY STRL (GAUZE/BANDAGES/DRESSINGS) IMPLANT
GLOVE BIO SURGEON STRL SZ7.5 (GLOVE) ×10 IMPLANT
GOWN STRL REUS W/ TWL LRG LVL3 (GOWN DISPOSABLE) ×3 IMPLANT
GOWN STRL REUS W/TWL LRG LVL3 (GOWN DISPOSABLE) ×3
KIT RM TURNOVER STRD PROC AR (KITS) ×2 IMPLANT
LABEL OR SOLS (LABEL) ×2 IMPLANT
LIQUID BAND (GAUZE/BANDAGES/DRESSINGS) ×2 IMPLANT
MESH SYNTHETIC 4X6 SOFT BARD (Mesh General) ×1 IMPLANT
MESH SYNTHETIC SOFT BARD 4X6 (Mesh General) ×1 IMPLANT
NEEDLE HYPO 25X1 1.5 SAFETY (NEEDLE) ×2 IMPLANT
NS IRRIG 500ML POUR BTL (IV SOLUTION) ×2 IMPLANT
PACK BASIN MINOR ARMC (MISCELLANEOUS) ×2 IMPLANT
PAD GROUND ADULT SPLIT (MISCELLANEOUS) ×2 IMPLANT
STAPLER SKIN PROX 35W (STAPLE) IMPLANT
SUT CHROMIC 3 0 SH 27 (SUTURE) ×2 IMPLANT
SUT MNCRL 4-0 (SUTURE) ×1
SUT MNCRL 4-0 27XMFL (SUTURE) ×1
SUT SURGILON 0 30 BLK (SUTURE) ×6 IMPLANT
SUTURE MNCRL 4-0 27XMF (SUTURE) ×1 IMPLANT
SYRINGE 10CC LL (SYRINGE) ×2 IMPLANT

## 2015-04-10 NOTE — Discharge Instructions (Addendum)
Take Tylenol or oxycodone with acetaminophen if needed for pain. May shower. Avoid straining and heavy lifting. Should not drive or do anything dangerous if taking oxycodone.  General Anesthesia, Adult, Care After Refer to this sheet in the next few weeks. These instructions provide you with information on caring for yourself after your procedure. Your health care provider may also give you more specific instructions. Your treatment has been planned according to current medical practices, but problems sometimes occur. Call your health care provider if you have any problems or questions after your procedure. WHAT TO EXPECT AFTER THE PROCEDURE After the procedure, it is typical to experience:  Sleepiness.  Nausea and vomiting. HOME CARE INSTRUCTIONS  For the first 24 hours after general anesthesia:  Have a responsible person with you.  Do not drive a car. If you are alone, do not take public transportation.  Do not drink alcohol.  Do not take medicine that has not been prescribed by your health care provider.  Do not sign important papers or make important decisions.  You may resume a normal diet and activities as directed by your health care provider.  Change bandages (dressings) as directed.  If you have questions or problems that seem related to general anesthesia, call the hospital and ask for the anesthetist or anesthesiologist on call. SEEK MEDICAL CARE IF:  You have nausea and vomiting that continue the day after anesthesia.  You develop a rash. SEEK IMMEDIATE MEDICAL CARE IF:   You have difficulty breathing.  You have chest pain.  You have any allergic problems.   This information is not intended to replace advice given to you by your health care provider. Make sure you discuss any questions you have with your health care provider.   Document Released: 08/22/2000 Document Revised: 06/06/2014 Document Reviewed: 09/14/2011 Elsevier Interactive Patient Education  [employer].

## 2015-04-10 NOTE — Op Note (Signed)
OPERATIVE REPORT  PREOPERATIVE  DIAGNOSIS: . Ventral hernia  POSTOPERATIVE DIAGNOSIS: . Ventral hernia  PROCEDURE: . Ventral hernia repair  ANESTHESIA:  General  SURGEON: Rochel Brome  MD   INDICATIONS: . She reports a history of bulging and pain in the lower epigastrium. A ventral hernia was demonstrated on physical exam. She had previously had laparoscopy through an incision below the navel and also another laparoscopy through an incision above the navel.  With the patient on the operating table in the supine position she was placed under general endotracheal anesthesia. The abdomen was prepared with ChloraPrep and draped in a sterile manner. A epigastric incision was made some 7 cm in length above the umbilicus so that the lower end of the incision extended down to approximately 1 cm above the umbilicus. The dissection was carried down through subcutaneous tissues. Electrocautery was used for hemostasis. A ventral hernia sac was encountered and was dissected free from surrounding structures and dissected down to a fascial ring defect. The fascial ring defect was approximately 3 cm in dimension. The properitoneal fat was separated from the fascia circumferentially. There was also a finding of a small umbilical defect just about 1 cm below the fascial defect there was some herniated properitoneal fat protruding up into the umbilical defect which was dissected away from this. Bard soft mesh was cut to create an oval shape of some 3 x 5 cm and was placed into the properitoneal plane oriented longitudinally so that the lower and covered the umbilical defect and was sewn to the deep fascia with through and through 0 Surgilon sutures both above and below and also on each side. Next the fascial ring defect was closed with a transversely oriented suture line of interrupted 0 Surgilon figure-of-eight sutures incorporating each suture into the mesh the deep fascia and subcutaneous tissues were infiltrated  with half percent Sensorcaine with epinephrine the subcutaneous tissues were closed with a 3-0 chromic pursestring suture. The skin was closed with running 4-0 Monocryl subcuticular suture and LiquiBand. The patient tolerated surgery satisfactorily and was prepared for transfer to the recovery room  [facility].D.

## 2015-04-10 NOTE — Anesthesia Postprocedure Evaluation (Signed)
  Anesthesia Post-op Note  Patient: Donna Riley  Procedure(s) Performed: Procedure(s): HERNIA REPAIR VENTRAL ADULT (N/A)  Anesthesia type:General  Patient location: PACU  Post pain: Pain level controlled  Post assessment: Post-op Vital signs reviewed, Patient's Cardiovascular Status Stable, Respiratory Function Stable, Patent Airway and No signs of Nausea or vomiting  Post vital signs: Reviewed and stable  Last Vitals:  Filed Vitals:   04/10/15 1458  BP: 137/82  Pulse: 72  Temp: 36.8 C  Resp: 16    Level of consciousness: awake, alert  and patient cooperative  Complications: No apparent anesthesia complications

## 2015-04-10 NOTE — Transfer of Care (Signed)
Immediate Anesthesia Transfer of Care Note  Patient: Donna Riley  Procedure(s) Performed: Procedure(s): HERNIA REPAIR VENTRAL ADULT (N/A)  Patient Location: PACU  Anesthesia Type:General  Level of Consciousness: awake, alert , oriented and patient cooperative  Airway & Oxygen Therapy: Patient Spontanous Breathing and Patient connected to nasal cannula oxygen  Post-op Assessment: Report given to RN, Post -op Vital signs reviewed and stable and Patient moving all extremities  Post vital signs: Reviewed and stable  Last Vitals:  Filed Vitals:   04/10/15 1458  BP: 137/82  Pulse: 72  Temp: 36.8 C  Resp: 16    Complications: No apparent anesthesia complications

## 2015-04-10 NOTE — Anesthesia Procedure Notes (Signed)
Procedure Name: Intubation Date/Time: 04/10/2015 1:17 PM Performed by: Alda Berthold Pre-anesthesia Checklist: Patient identified, Patient being monitored, Timeout performed, Emergency Drugs available and Suction available Patient Re-evaluated:Patient Re-evaluated prior to inductionOxygen Delivery Method: Circle system utilized Preoxygenation: Pre-oxygenation with 100% oxygen Intubation Type: IV induction Ventilation: Mask ventilation without difficulty Laryngoscope Size: Mac and 3 Grade View: Grade I Tube type: Oral Tube size: 6.5 mm Number of attempts: 1 Placement Confirmation: ETT inserted through vocal cords under direct vision,  positive ETCO2 and breath sounds checked- equal and bilateral Secured at: 20 cm Tube secured with: Tape Dental Injury: Teeth and Oropharynx as per pre-operative assessment

## 2015-04-10 NOTE — Anesthesia Preprocedure Evaluation (Signed)
Anesthesia Evaluation  Patient identified by MRN, date of birth, ID band Patient awake    Reviewed: Allergy & Precautions, NPO status , Patient's Chart, lab work & pertinent test results  Airway Mallampati: II  TM Distance: >3 FB Neck ROM: Full    Dental  (+) Teeth Intact   Pulmonary Current Smoker,    Pulmonary exam normal        Cardiovascular Exercise Tolerance: Good hypertension, Pt. on medications Normal cardiovascular exam     Neuro/Psych    GI/Hepatic   Endo/Other  diabetes, Type 2BG 103.  Renal/GU      Musculoskeletal  (+) Arthritis , Osteoarthritis,    Abdominal Normal abdominal exam  (+)   Peds  Hematology   Anesthesia Other Findings Recent breast surgery and axillary node excision on left, in April 2016.Marland Kitchen  Reproductive/Obstetrics                             Anesthesia Physical Anesthesia Plan  ASA: III  Anesthesia Plan: General   Post-op Pain Management:    Induction: Intravenous  Airway Management Planned: Oral ETT  Additional Equipment:   Intra-op Plan:   Post-operative Plan: Extubation in OR  Informed Consent: I have reviewed the patients History and Physical, chart, labs and discussed the procedure including the risks, benefits and alternatives for the proposed anesthesia with the patient or authorized representative who has indicated his/her understanding and acceptance.     Plan Discussed with: CRNA  Anesthesia Plan Comments:         Anesthesia Quick Evaluation

## 2015-04-10 NOTE — H&P (Signed)
  She reports no change in condition since the day of the office visit. The ventral hernia was examined. I discussed the plan for ventral hernia repair and follow-up care.

## 2015-04-13 ENCOUNTER — Encounter: Payer: Self-pay | Admitting: Surgery

## 2015-04-13 ENCOUNTER — Ambulatory Visit: Payer: Commercial Managed Care - HMO | Admitting: Hematology and Oncology

## 2015-04-13 ENCOUNTER — Other Ambulatory Visit: Payer: Commercial Managed Care - HMO

## 2015-04-15 ENCOUNTER — Ambulatory Visit
Admission: RE | Admit: 2015-04-15 | Discharge: 2015-04-15 | Disposition: A | Discharge status: Home / Self Care | Payer: Commercial Managed Care - HMO | Source: Ambulatory Visit | Attending: Radiation Oncology | Admitting: Radiation Oncology

## 2015-04-15 ENCOUNTER — Encounter: Payer: Self-pay | Admitting: Radiation Oncology

## 2015-04-15 VITALS — BP 177/103 | HR 92 | Temp 98.0°F | Wt 206.8 lb

## 2015-04-15 DIAGNOSIS — C50912 Malignant neoplasm of unspecified site of left female breast: Secondary | ICD-10-CM

## 2015-04-15 NOTE — Progress Notes (Signed)
Radiation Oncology Follow up Note  Name: Donna Riley   Date:   04/15/2015 MRN:  [id number] DOB: [birth year]    This 54 y.o. 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  REFERRING PROVIDER: Glean Hess, MD  HPI: Patient is a 54 year old female now 1 month out having completed radiation therapy to her left breast and peripheral lymphatics for stage IIa invasive mammary carcinoma status post chemotherapy. Tumor was ER/PR positive HER-2/neu negative. She's currently on tamoxifen tolerating that well without side effect or complaint. She specifically denies breast tenderness cough or bone pain..  COMPLICATIONS OF TREATMENT: none  FOLLOW UP COMPLIANCE: keeps appointments   PHYSICAL EXAM:  BP 177/103 mmHg  Pulse 92  Temp(Src) 98 F (36.7 C)  Wt 206 lb 12.7 oz (93.8 kg) Lungs are clear to A&P cardiac examination essentially unremarkable with regular rate and rhythm. No dominant mass or nodularity is noted in either breast in 2 positions examined. Incision is well-healed. No axillary or supraclavicular adenopathy is appreciated. There is some contraction of the breast towards her scar although the cosmetic result is still good. Well-developed well-nourished patient in NAD. HEENT reveals PERLA, EOMI, discs not visualized.  Oral cavity is clear. No oral mucosal lesions are identified. Neck is clear without evidence of cervical or supraclavicular adenopathy. Lungs are clear to A&P. Cardiac examination is essentially unremarkable with regular rate and rhythm without murmur rub or thrill. Abdomen is benign with no organomegaly or masses noted. Motor sensory and DTR levels are equal and symmetric in the upper and lower extremities. Cranial nerves II through XII are grossly  intact. Proprioception is intact. No peripheral adenopathy or edema is identified. No motor or sensory levels are noted. Crude visual fields are within normal range.  RADIOLOGY RESULTS: No current films for review  PLAN: At the present time she is doing well 1 month out recovering nicely from combined modality treatment. I'm please were overall progress. I've asked to see her back in 4-5 months for follow-up. She knows to call sooner with any concerns. She continues on tamoxifen without side effect.  I would like to take this opportunity for allowing me to participate in the care of your patient.Armstead Peaks., MD

## 2015-04-20 ENCOUNTER — Ambulatory Visit: Payer: Commercial Managed Care - HMO | Admitting: Hematology and Oncology

## 2015-04-20 ENCOUNTER — Other Ambulatory Visit: Payer: Commercial Managed Care - HMO

## 2015-05-01 ENCOUNTER — Other Ambulatory Visit: Payer: Self-pay | Admitting: Hematology and Oncology

## 2015-05-04 ENCOUNTER — Other Ambulatory Visit: Payer: Self-pay

## 2015-05-04 MED ORDER — METFORMIN HCL 500 MG PO TABS: 500.0000 mg | ORAL_TABLET | Freq: Every day | ORAL | Status: DC

## 2015-05-06 ENCOUNTER — Other Ambulatory Visit: Payer: Self-pay | Admitting: Internal Medicine

## 2015-05-11 ENCOUNTER — Other Ambulatory Visit: Payer: Commercial Managed Care - HMO

## 2015-05-11 ENCOUNTER — Ambulatory Visit: Payer: Commercial Managed Care - HMO | Admitting: Hematology and Oncology

## 2015-05-13 NOTE — Telephone Encounter (Signed)
pts coming in on 1/23 °

## 2015-05-15 ENCOUNTER — Encounter: Payer: Self-pay | Admitting: *Deleted

## 2015-05-21 ENCOUNTER — Telehealth: Payer: Self-pay

## 2015-05-21 ENCOUNTER — Inpatient Hospital Stay: Payer: Commercial Managed Care - HMO

## 2015-05-21 ENCOUNTER — Inpatient Hospital Stay: Payer: Commercial Managed Care - HMO | Attending: Hematology and Oncology

## 2015-05-21 DIAGNOSIS — Z170 Estrogen receptor positive status [ER+]: Secondary | ICD-10-CM | POA: Insufficient documentation

## 2015-05-21 DIAGNOSIS — Z923 Personal history of irradiation: Secondary | ICD-10-CM | POA: Insufficient documentation

## 2015-05-21 DIAGNOSIS — R0789 Other chest pain: Secondary | ICD-10-CM | POA: Insufficient documentation

## 2015-05-21 DIAGNOSIS — M25512 Pain in left shoulder: Secondary | ICD-10-CM | POA: Insufficient documentation

## 2015-05-21 DIAGNOSIS — C50912 Malignant neoplasm of unspecified site of left female breast: Secondary | ICD-10-CM | POA: Insufficient documentation

## 2015-05-21 DIAGNOSIS — Z7984 Long term (current) use of oral hypoglycemic drugs: Secondary | ICD-10-CM | POA: Insufficient documentation

## 2015-05-21 DIAGNOSIS — N644 Mastodynia: Secondary | ICD-10-CM | POA: Insufficient documentation

## 2015-05-21 DIAGNOSIS — Z9012 Acquired absence of left breast and nipple: Secondary | ICD-10-CM | POA: Insufficient documentation

## 2015-05-21 DIAGNOSIS — E119 Type 2 diabetes mellitus without complications: Secondary | ICD-10-CM | POA: Insufficient documentation

## 2015-05-21 DIAGNOSIS — F17210 Nicotine dependence, cigarettes, uncomplicated: Secondary | ICD-10-CM | POA: Insufficient documentation

## 2015-05-21 DIAGNOSIS — I10 Essential (primary) hypertension: Secondary | ICD-10-CM | POA: Insufficient documentation

## 2015-05-21 DIAGNOSIS — Z79899 Other long term (current) drug therapy: Secondary | ICD-10-CM | POA: Insufficient documentation

## 2015-05-21 NOTE — Telephone Encounter (Signed)
Patient did not show up for SCP visit.  T/C to patient and patient request SCP be mailed to her.  Encouraged patient to call for any questions or concerns.  SCP mailed.

## 2015-05-27 ENCOUNTER — Encounter: Payer: Self-pay | Admitting: Family Medicine

## 2015-05-27 ENCOUNTER — Other Ambulatory Visit: Payer: Self-pay | Admitting: Family Medicine

## 2015-05-27 ENCOUNTER — Inpatient Hospital Stay (HOSPITAL_BASED_OUTPATIENT_CLINIC_OR_DEPARTMENT_OTHER): Payer: Commercial Managed Care - HMO | Admitting: Family Medicine

## 2015-05-27 VITALS — BP 181/92 | HR 76 | Temp 96.9°F | Ht 67.0 in | Wt 211.4 lb

## 2015-05-27 DIAGNOSIS — C50912 Malignant neoplasm of unspecified site of left female breast: Secondary | ICD-10-CM

## 2015-05-27 DIAGNOSIS — F17210 Nicotine dependence, cigarettes, uncomplicated: Secondary | ICD-10-CM | POA: Diagnosis not present

## 2015-05-27 DIAGNOSIS — R0789 Other chest pain: Secondary | ICD-10-CM | POA: Diagnosis not present

## 2015-05-27 DIAGNOSIS — N644 Mastodynia: Secondary | ICD-10-CM | POA: Diagnosis not present

## 2015-05-27 DIAGNOSIS — Z9012 Acquired absence of left breast and nipple: Secondary | ICD-10-CM

## 2015-05-27 DIAGNOSIS — Z923 Personal history of irradiation: Secondary | ICD-10-CM | POA: Diagnosis not present

## 2015-05-27 DIAGNOSIS — Z79899 Other long term (current) drug therapy: Secondary | ICD-10-CM

## 2015-05-27 DIAGNOSIS — E119 Type 2 diabetes mellitus without complications: Secondary | ICD-10-CM | POA: Diagnosis not present

## 2015-05-27 DIAGNOSIS — Z170 Estrogen receptor positive status [ER+]: Secondary | ICD-10-CM

## 2015-05-27 DIAGNOSIS — Z7984 Long term (current) use of oral hypoglycemic drugs: Secondary | ICD-10-CM | POA: Diagnosis not present

## 2015-05-27 DIAGNOSIS — M25512 Pain in left shoulder: Secondary | ICD-10-CM

## 2015-05-27 DIAGNOSIS — I10 Essential (primary) hypertension: Secondary | ICD-10-CM | POA: Diagnosis not present

## 2015-05-27 MED ORDER — CYCLOBENZAPRINE HCL 5 MG PO TABS: 5.0000 mg | ORAL_TABLET | Freq: Three times a day (TID) | ORAL | Status: DC | PRN

## 2015-05-27 MED ORDER — TRAMADOL HCL 50 MG PO TABS: 50.0000 mg | ORAL_TABLET | Freq: Four times a day (QID) | ORAL | Status: DC | PRN

## 2015-05-27 NOTE — Progress Notes (Signed)
Patient presents to clinic today. Requested to be added to schedule to evaluate left breast tenderness and muscle tightness. She reports intermittent numbness in left breast that extends into the forearm. Symptoms have occurred x 3 weeks. Pt "expressed concerns and worries that her cancer is reoccurring."

## 2015-05-27 NOTE — Progress Notes (Signed)
Donna Riley  [phone]  [phone]     Donna Riley DOB: December 15, 1960  MR#: [id number]  QBV#:[id number]  Patient Care Team: Glean Hess, MD as PCP - General (Family Medicine) Molly Maduro, MD as Referring Physician (Surgery)  CHIEF COMPLAINT:  Chief Complaint  Patient presents with  . breast exam    c/o muscle tightness & tenderness on left breast x 3 weeks; requesting breast exam; h/o left breast cancer; acute add on-walk in    INTERVAL HISTORY:  Patient is here as an acute add on for muscle tightness above the left breast that radiates into the left shoulder. Patient has history of stage II a left breast cancer, status post radiation and has been started on Arimidex for approximately 3 months now. She has previously reported having some achiness in her upper arms as well as her shoulders when started on Arimidex. Today she reports that this has worsened over the last month or so and has asked to be seen today.  REVIEW OF SYSTEMS:   Review of Systems  Constitutional: Negative for fever, chills, weight loss, malaise/fatigue and diaphoresis.  HENT: Negative for congestion and sore throat.   Eyes: Negative.   Respiratory: Negative for shortness of breath, wheezing and stridor.   Cardiovascular: Negative for chest pain, palpitations, orthopnea, claudication, leg swelling and PND.  Gastrointestinal: Negative for heartburn, nausea, vomiting, abdominal pain, diarrhea, constipation, blood in stool and melena.  Genitourinary: Negative.   Musculoskeletal: Positive for myalgias and joint pain.       Muscle tightness and tenderness over left chest wall into shoulder area  Skin: Negative.   Neurological: Negative for dizziness, tingling, focal weakness, seizures and weakness.  Endo/Heme/Allergies: Does not bruise/bleed easily.  Psychiatric/Behavioral: Negative for depression. The patient is not nervous/anxious and does not have insomnia.     As  per HPI. Otherwise, a complete review of systems is negatve.  ONCOLOGY HISTORY: Stage IIA left breast cancer status post partial mastectomy and sentinel lymph node biopsy on 08/27/2014. 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 There was lymphovascular invasion. One of 2 sentinel lymph nodes were positive (focus of 2.8 mm). Tumor was > 90% ER positive, > 90% PR positive, and Her2/neu negative. Pathologic stage was T1bN1aM0.  PAST MEDICAL HISTORY: Past Medical History  Diagnosis Date  . Prediabetes   . Elevated LFTs   . Positive PPD, treated   . Hypertension   . Arthritis   . Breast cancer ([place])     left  . Diabetes mellitus without complication ([place]) 06/8001    PAST SURGICAL HISTORY: Past Surgical History  Procedure Laterality Date  . Cholecystectomy    . Tubal ligation Bilateral   . Breast lumpectomy with needle localization and axillary sentinel lymph node bx Left   . Breast biopsy Left   . Esophagogastroduodenoscopy  2014    gastritis  . Ventral hernia repair N/A 04/10/2015    Procedure: HERNIA REPAIR VENTRAL ADULT;  Surgeon: Leonie Green, MD;  Location: [facility] ORS;  Service: General;  Laterality: N/A;    FAMILY HISTORY Family History  Problem Relation Age of Onset  . Cancer Mother     Pancreatic  . Cancer Father     Throat  . Cancer Maternal Grandmother     Breast    GYNECOLOGIC HISTORY:  No LMP recorded. Patient is postmenopausal.     ADVANCED DIRECTIVES:    HEALTH MAINTENANCE:  Social History  Substance Use Topics  . Smoking status: Current Every Day Smoker -- 0.50 packs/day for 38 years    Types: Cigarettes  . Smokeless tobacco: Never Used  . Alcohol Use: 0.0 - 0.6 oz/week    0-1 Cans of beer per week     Comment: Occasionally      No Known Allergies  Current Outpatient Prescriptions  Medication Sig Dispense Refill  . amLODipine (NORVASC) 5 MG tablet TAKE 1 TABLET  EVERY DAY 90 tablet 0  . anastrozole (ARIMIDEX) 1 MG tablet TAKE ONE (1) TABLET BY MOUTH EVERY DAY 30 tablet 1  . BAYER BREEZE 2 TEST DISK     . Cholecalciferol (VITAMIN D3) 1000 UNITS CAPS Take 1 capsule by mouth every morning.    . clindamycin (CLEOCIN-T) 1 % lotion Apply topically 2 (two) times daily. 60 mL 0  . cyclobenzaprine (FLEXERIL) 5 MG tablet Take 1 tablet (5 mg total) by mouth 3 (three) times daily as needed for muscle spasms. 30 tablet 0  . lisinopril (PRINIVIL,ZESTRIL) 20 MG tablet TAKE 1 TABLET EVERY DAY 90 tablet 0  . metFORMIN (GLUCOPHAGE) 500 MG tablet Take 1 tablet (500 mg total) by mouth daily. Every am 30 tablet 0  . MICROLET LANCETS MISC     . sucralfate (CARAFATE) 1 G tablet Take 1 tablet by mouth 3 (three) times daily.    . traMADol (ULTRAM) 50 MG tablet Take 1 tablet (50 mg total) by mouth every 6 (six) hours as needed. 30 tablet 0   No current facility-administered medications for this visit.    OBJECTIVE: BP 181/92 mmHg  Pulse 76  Temp(Src) 96.9 F (36.1 C) (Tympanic)  Ht '5\' 7"'$  (1.702 m)  Wt 211 lb 6.7 oz (95.9 kg)  BMI 33.11 kg/m2   Body mass index is 33.11 kg/(m^2).    ECOG FS:1 - Symptomatic but completely ambulatory  General: Well-developed, well-nourished, no acute distress. Eyes: Pink conjunctiva, anicteric sclera. HEENT: Normocephalic, moist mucous membranes, clear oropharnyx. Lungs: Clear to auscultation bilaterally. Heart: Regular rate and rhythm. No rubs, murmurs, or gallops. Abdomen: Soft, nontender, nondistended. No organomegaly noted, normoactive bowel sounds. Breast: right breast palpated in a circular manner in the sitting and supine positions.  No masses or fullness palpated.  Left breast noted to have hyperpigmented and skin changes, postsurgical and postradiation changes. Bilateral axilla palpated in both positions with no masses or fullness palpated. Left side of chest and pectoral muscles feel tense. Musculoskeletal: No edema, cyanosis, or  clubbing. Neuro: Alert, answering all questions appropriately. Cranial nerves grossly intact. Skin: No rashes or petechiae noted. Psych: Normal affect. Lymphatics: no cervical, clavicular, or axillary LAD.   LAB RESULTS:  No visits with results within 3 Day(s) from this visit. Latest known visit with results is:  Admission on 04/10/2015, Discharged on 04/10/2015  Component Date Value Ref Range Status  . Glucose-Capillary 04/10/2015 103* 65 - 99 mg/dL Final  . Glucose-Capillary 04/10/2015 110* 65 - 99 mg/dL Final    STUDIES: No results found.  ASSESSMENT:  Carcinoma of the left breast.  Pain in left upper chest/breast area that radiates into the left shoulder.  PLAN:   1. Carcinoma of left breast. Stage IIA left breast cancer status post partial mastectomy and sentinel lymph node biopsy on 08/27/2014. 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 There was lymphovascular invasion. One of 2 sentinel lymph nodes were positive (focus of 2.8 mm). Tumor was >  90% ER positive, > 90% PR positive, and Her2/neu negative. Pathologic stage was T1bN1aM0. Patient is currently on Arimidex. She continues with the achiness in her upper extremities and shoulder. Clinically there is no evidence of recurrent disease, masses.  The pectoral muscle of the left chest wall does feel tense and tight and is tender to palpation. We'll start patient on Flexeril as well as Ultram for pain.  Patient advised to follow up early next week with Dr. Mike Gip to evaluate effectiveness of treatment.  Patient expressed understanding and was in agreement with this plan. She also understands that She can call clinic at any time with any questions, concerns, or complaints.   Dr. Grayland Ormond was available for consultation and review of plan of care for this patient.   Evlyn Kanner, NP   05/27/2015 12:59 PM

## 2015-06-02 ENCOUNTER — Ambulatory Visit: Payer: Commercial Managed Care - HMO

## 2015-06-02 ENCOUNTER — Other Ambulatory Visit: Payer: Self-pay

## 2015-06-02 ENCOUNTER — Inpatient Hospital Stay: Payer: Commercial Managed Care - HMO | Attending: Hematology and Oncology | Admitting: Hematology and Oncology

## 2015-06-02 ENCOUNTER — Ambulatory Visit: Payer: Commercial Managed Care - HMO | Admitting: Hematology and Oncology

## 2015-06-02 VITALS — BP 148/92 | HR 90 | Temp 97.8°F | Resp 18 | Ht 67.0 in | Wt 210.1 lb

## 2015-06-02 DIAGNOSIS — R160 Hepatomegaly, not elsewhere classified: Secondary | ICD-10-CM | POA: Insufficient documentation

## 2015-06-02 DIAGNOSIS — Z923 Personal history of irradiation: Secondary | ICD-10-CM | POA: Diagnosis not present

## 2015-06-02 DIAGNOSIS — M8580 Other specified disorders of bone density and structure, unspecified site: Secondary | ICD-10-CM | POA: Insufficient documentation

## 2015-06-02 DIAGNOSIS — M1990 Unspecified osteoarthritis, unspecified site: Secondary | ICD-10-CM | POA: Diagnosis not present

## 2015-06-02 DIAGNOSIS — F17210 Nicotine dependence, cigarettes, uncomplicated: Secondary | ICD-10-CM | POA: Insufficient documentation

## 2015-06-02 DIAGNOSIS — L732 Hidradenitis suppurativa: Secondary | ICD-10-CM | POA: Diagnosis not present

## 2015-06-02 DIAGNOSIS — I10 Essential (primary) hypertension: Secondary | ICD-10-CM | POA: Diagnosis not present

## 2015-06-02 DIAGNOSIS — C50912 Malignant neoplasm of unspecified site of left female breast: Secondary | ICD-10-CM | POA: Diagnosis not present

## 2015-06-02 DIAGNOSIS — Z79811 Long term (current) use of aromatase inhibitors: Secondary | ICD-10-CM | POA: Insufficient documentation

## 2015-06-02 DIAGNOSIS — E119 Type 2 diabetes mellitus without complications: Secondary | ICD-10-CM | POA: Diagnosis not present

## 2015-06-02 DIAGNOSIS — Z170 Estrogen receptor positive status [ER+]: Secondary | ICD-10-CM | POA: Insufficient documentation

## 2015-06-02 DIAGNOSIS — R0789 Other chest pain: Secondary | ICD-10-CM | POA: Diagnosis not present

## 2015-06-02 LAB — CBC WITH DIFFERENTIAL/PLATELET
Basophils Absolute: 0.1 10*3/uL (ref 0–0.1)
Basophils Relative: 1 %
Eosinophils Absolute: 0.1 10*3/uL (ref 0–0.7)
Eosinophils Relative: 2 %
HCT: 43.2 % (ref 35.0–47.0)
Hemoglobin: 14.2 g/dL (ref 12.0–16.0)
Lymphocytes Relative: 23 %
Lymphs Abs: 1.4 10*3/uL (ref 1.0–3.6)
MCH: 26.4 pg (ref 26.0–34.0)
MCHC: 32.7 g/dL (ref 32.0–36.0)
MCV: 80.6 fL (ref 80.0–100.0)
Monocytes Absolute: 0.4 10*3/uL (ref 0.2–0.9)
Monocytes Relative: 7 %
Neutro Abs: 3.9 10*3/uL (ref 1.4–6.5)
Neutrophils Relative %: 67 %
Platelets: 194 10*3/uL (ref 150–440)
RBC: 5.36 MIL/uL — ABNORMAL HIGH (ref 3.80–5.20)
RDW: 14.9 % — ABNORMAL HIGH (ref 11.5–14.5)
WBC: 5.9 10*3/uL (ref 3.6–11.0)

## 2015-06-02 LAB — COMPREHENSIVE METABOLIC PANEL
ALT: 35 U/L (ref 14–54)
AST: 26 U/L (ref 15–41)
Albumin: 4.2 g/dL (ref 3.5–5.0)
Alkaline Phosphatase: 95 U/L (ref 38–126)
Anion gap: 8 (ref 5–15)
BUN: 13 mg/dL (ref 6–20)
CO2: 25 mmol/L (ref 22–32)
Calcium: 9.5 mg/dL (ref 8.9–10.3)
Chloride: 102 mmol/L (ref 101–111)
Creatinine, Ser: 0.75 mg/dL (ref 0.44–1.00)
GFR calc Af Amer: >60 mL/min (ref >60)
GFR calc non Af Amer: >60 mL/min (ref >60)
Glucose, Bld: 106 mg/dL — ABNORMAL HIGH (ref 65–99)
Potassium: 3.7 mmol/L (ref 3.5–5.1)
Sodium: 135 mmol/L (ref 135–145)
Total Bilirubin: 0.3 mg/dL (ref 0.3–1.2)
Total Protein: 7.8 g/dL (ref 6.5–8.1)

## 2015-06-02 MED ORDER — OXYCODONE-ACETAMINOPHEN 5-325 MG PO TABS: 1.0000 | ORAL_TABLET | Freq: Four times a day (QID) | ORAL | Status: DC | PRN

## 2015-06-02 NOTE — Progress Notes (Signed)
Patient came last week to see Georgeanne Nim, ANP. She was having pain/tightness in her left breast area/chest wall. Patient states that Magda Paganini thought it was muscle pain/spasms and also that it could be the lymph nodes post surgery. Patient was given a Rx for Flexeril and Tramadol which she states did not help and that the Tramadol makes her itch.

## 2015-06-02 NOTE — Progress Notes (Signed)
[facility] day:  06/02/2015   Chief Complaint: Donna Riley is a 55 y.o. 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  HPI: The patient was last seen in the medical oncology clinic on 04/09/2015.  At that time, she was seen for 1 month assessment on Arimidex.  She noted a little achiness in her upper extremities and mild vasomotor symptoms.  Exam revealed left breast post radiation changes.  She had a left axillary cyst secondary to hidradenitis suppurativa.  Liver function tests were normal.  She was given information on Effexor.  She was scheduled to undergo ventral surgery repair on 04/09/00/2016.  During the interim, she was seen by Rachel Bo, NP, on 05/27/2015 for left chest wall pain.  She was started on Flexeril and Ultram for pain.  She states that the Tramadol causes itching.  Her pain has not improved.  She feels better when pressing her arms inward toward her chest and stretching the muscle.  She notes diffuse aches in her arms and legs.  She has difficulty sleeping at night.  Past Medical History  Diagnosis Date  . Prediabetes   . Elevated LFTs   . Positive PPD, treated   . Hypertension   . Arthritis   . Breast cancer ([place])     left  . Diabetes mellitus without complication ([place]) 01/9356    Past Surgical History  Procedure Laterality Date  . Cholecystectomy    . Tubal ligation Bilateral   . Breast lumpectomy with needle localization and axillary sentinel lymph node bx Left   . Breast biopsy Left   . Esophagogastroduodenoscopy  2014    gastritis  . Ventral hernia repair N/A 04/10/2015    Procedure: HERNIA REPAIR VENTRAL ADULT;  Surgeon: Leonie Green, MD;  Location: [facility] ORS;  Service: General;  Laterality: N/A;    Family History  Problem Relation Age of Onset  . Cancer Mother     Pancreatic  . Cancer Father     Throat  . Cancer Maternal  Grandmother     Breast    Social History:  reports that she has been smoking Cigarettes.  She has a 19 pack-year smoking history. She has never used smokeless tobacco. She reports that she drinks alcohol. She reports that she does not use illicit drugs.  The patient is alone today.  Allergies: No Known Allergies  Current Medications: Current Outpatient Prescriptions  Medication Sig Dispense Refill  . amLODipine (NORVASC) 5 MG tablet TAKE 1 TABLET EVERY DAY 90 tablet 0  . anastrozole (ARIMIDEX) 1 MG tablet TAKE ONE (1) TABLET BY MOUTH EVERY DAY 30 tablet 1  . BAYER BREEZE 2 TEST DISK     . Cholecalciferol (VITAMIN D3) 1000 UNITS CAPS Take 1 capsule by mouth every morning.    . clindamycin (CLEOCIN-T) 1 % lotion Apply topically 2 (two) times daily. 60 mL 0  . cyclobenzaprine (FLEXERIL) 5 MG tablet Take 1 tablet (5 mg total) by mouth 3 (three) times daily as needed for muscle spasms. 30 tablet 0  . lisinopril (PRINIVIL,ZESTRIL) 20 MG tablet TAKE 1 TABLET EVERY DAY 90 tablet 0  . metFORMIN (GLUCOPHAGE) 500 MG tablet Take 1 tablet (500 mg total) by mouth daily. Every am 30 tablet 0  . MICROLET LANCETS MISC     . sucralfate (CARAFATE) 1 G tablet Take 1 tablet by mouth 3 (three) times daily.    Marland Kitchen  traMADol (ULTRAM) 50 MG tablet Take 1 tablet (50 mg total) by mouth every 6 (six) hours as needed. 30 tablet 0  . oxyCODONE-acetaminophen (ROXICET) 5-325 MG tablet Take 1 tablet by mouth every 6 (six) hours as needed for severe pain. 30 tablet 0   No current facility-administered medications for this visit.    Review of Systems:  GENERAL: Feels "ok". No fevers, sweats or weight loss. PERFORMANCE STATUS (ECOG): 0 HEENT: No visual changes, runny nose, sore throat, mouth sores or tenderness. Lungs: No shortness of breath or cough. No hemoptysis. Cardiac: No chest pain, palpitations, orthopnea, or PND. Breast: Left breast darker post radiation.  Chest tightness which helps by pressing her arms  inward.  No lumps or erythema. GI: Constipation.  No nausea, vomiting, melena or hematochezia. GU: No urgency, frequency, dysuria, or hematuria. Musculoskeletal: Aches in arms and legs.  No back pain. No joint pain. No muscle tenderness. Extremities: No pain or swelling. Skin: No skin changes, rashes or ulcers. Neuro:No headache, numbness or weakness, balance or coordination issues. Endocrine: Vasomotor symptoms.  No diabetes, thyroid issues or night sweats. Psych: Trouble sleeping at night.  No mood changes, depression or anxiety. Pain: No focal pain. Review of systems: All other systems reviewed and found to be negative.  Physical Exam: Blood pressure 148/92, pulse 90, temperature 97.8 F (36.6 C), resp. rate 18, height '5\' 7"'$  (1.702 m), weight 210 lb 1.6 oz (95.3 kg). GENERAL: Well developed, well nourished, sitting comfortably in the exam room in no acute distress. MENTAL STATUS: Alert and oriented to person, place and time. HEAD: Long dark brown slightly curly hair. Normocephalic, atraumatic, face symmetric, no Cushingoid features. EYES: Brown eyes. Pupils equal round and reactive to light and accomodation. No conjunctivitis or scleral icterus. ENT: Oropharynx clear without lesion. Tongue normal. Mucous membranes moist.  RESPIRATORY: Clear to auscultation without rales, wheezes or rhonchi. CARDIOVASCULAR: Regular rate and rhythm without murmur, rub or gallop. BREASTS:  Right breast without masses, skin changes or nipple discharge.  Left breast with moderate hyperpigmentation.  Post surgical and post radiation changes with edema (pronounced lower inner quadrant), no change. ABDOMEN: Ventral hernia.  Soft, non-tender, with active bowel sounds, and no hepatosplenomegaly. No masses. SKIN:  Left axillary cyst secondary to hidradenitis suppurativa.  No rashes, ulcers or lesions. EXTREMITIES: No edema, no skin discoloration or tenderness. No palpable cords. LYMPH  NODES: No palpable cervical, supraclavicular, axillary or inguinal adenopathy  NEUROLOGICAL: Unremarkable.  PSYCH: Appropriate.  No visits with results within 3 Day(s) from this visit. Latest known visit with results is:  Admission on 04/10/2015, Discharged on 04/10/2015  Component Date Value Ref Range Status  . Glucose-Capillary 04/10/2015 103* 65 - 99 mg/dL Final  . Glucose-Capillary 04/10/2015 110* 65 - 99 mg/dL Final    Assessment:  Donna Riley is a 55 y.o. African American female with stage IIA left breast cancer status post partial mastectomy and sentinel lymph node biopsy on 08/27/2014. 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 There was lymphovascular invasion. One of 2 sentinel lymph nodes were positive (focus of 2.8 mm). Tumor was > 90% ER positive, > 90% PR positive, and Her2/neu negative. Pathologic stage was T1bN1aM0.  Bone scan on 09/16/2014 revealed abnormal focal uptake at the level of right L3 pedicle and L5 vertebral body. Lumbar spine MRI on 09/27/2014 revealed no evidence of metastatic disease with lower lumbar facet arthritis. Abdominal and pelvic CT scan on 09/24/2014  revealed hepatomegaly and no evidence of metastatic disease.   Bone density study on 09/15/2014 revealed a T-score of -2.1 at L1-L4 (osteopenia).  She is on calcium and vitamin D.  She received 4 cycles of Taxotere and Cytoxan (09/29/2014 - 12/09/2014) with Neulasta support. She received 50.4 Gy to the left breast from 01/12/2015 until 02/23/2015.  She was started on Femara on 03/12/2015, but switched to Arimidex on 03/30/2015 secondary to diffuse joint aches.    Symptomatically, she has some achiness due to Arimidex (tolerable).  She has chest wall tightness due to radiation.  Exam reveals left breast post radiation changes.  She has a left axillary cyst secondary to hidradenitis suppurativa.  Plan: 1.  Labs today: CBC  with diff, CMP, CA27.29. 2.  Schedule bilateral diagnostic mammogram 07/30/2015. 3.  Consult lymphedema clinic 4.  Discuss patient's thoughts about continuation of Arimidex.  Discuss hormonal options.  Patient wishes to continue. 5.  Continue Arimidex 1 mg a day. 6.  Rx:  Percocet 5/325 1 tablet po q 6 hrs prn pain (dis #30). 7.  RTC after mammogram for MD assessment, labs, and review of mammogram.   Lequita Asal, MD  06/02/2015, 9:32 AM

## 2015-06-03 ENCOUNTER — Other Ambulatory Visit: Payer: Self-pay | Admitting: Family Medicine

## 2015-06-03 LAB — CANCER ANTIGEN 27.29: CA 27.29: 12.1 U/mL (ref 0.0–38.6)

## 2015-06-09 ENCOUNTER — Encounter: Payer: Self-pay | Admitting: Internal Medicine

## 2015-06-09 ENCOUNTER — Other Ambulatory Visit: Payer: Self-pay

## 2015-06-09 MED ORDER — METFORMIN HCL 500 MG PO TABS: 500.0000 mg | ORAL_TABLET | Freq: Every day | ORAL | Status: DC

## 2015-06-09 NOTE — Telephone Encounter (Signed)
Received fax from pharmacy requesting medications. [facility]

## 2015-06-10 NOTE — Telephone Encounter (Signed)
pts coming in on 1/23 °

## 2015-06-20 ENCOUNTER — Encounter: Payer: Self-pay | Admitting: Internal Medicine

## 2015-06-22 ENCOUNTER — Encounter: Payer: Self-pay | Admitting: Internal Medicine

## 2015-06-22 ENCOUNTER — Ambulatory Visit (INDEPENDENT_AMBULATORY_CARE_PROVIDER_SITE_OTHER): Payer: Commercial Managed Care - HMO | Admitting: Internal Medicine

## 2015-06-22 VITALS — BP 146/88 | HR 88 | Ht 68.0 in | Wt 207.0 lb

## 2015-06-22 DIAGNOSIS — Z794 Long term (current) use of insulin: Secondary | ICD-10-CM

## 2015-06-22 DIAGNOSIS — K439 Ventral hernia without obstruction or gangrene: Secondary | ICD-10-CM | POA: Diagnosis not present

## 2015-06-22 DIAGNOSIS — G629 Polyneuropathy, unspecified: Secondary | ICD-10-CM | POA: Insufficient documentation

## 2015-06-22 DIAGNOSIS — E1169 Type 2 diabetes mellitus with other specified complication: Secondary | ICD-10-CM

## 2015-06-22 DIAGNOSIS — E119 Type 2 diabetes mellitus without complications: Secondary | ICD-10-CM | POA: Diagnosis not present

## 2015-06-22 DIAGNOSIS — L84 Corns and callosities: Secondary | ICD-10-CM

## 2015-06-22 DIAGNOSIS — C50912 Malignant neoplasm of unspecified site of left female breast: Secondary | ICD-10-CM | POA: Diagnosis not present

## 2015-06-22 DIAGNOSIS — I10 Essential (primary) hypertension: Secondary | ICD-10-CM | POA: Diagnosis not present

## 2015-06-22 DIAGNOSIS — E118 Type 2 diabetes mellitus with unspecified complications: Secondary | ICD-10-CM | POA: Insufficient documentation

## 2015-06-22 MED ORDER — LISINOPRIL 40 MG PO TABS: 40.0000 mg | ORAL_TABLET | Freq: Every day | ORAL | Status: DC

## 2015-06-22 MED ORDER — GABAPENTIN 100 MG PO CAPS: 100.0000 mg | ORAL_CAPSULE | Freq: Three times a day (TID) | ORAL | Status: DC

## 2015-06-22 NOTE — Progress Notes (Signed)
Date:  06/22/2015   Name:  Donna Riley   DOB:  Sep 28, 1960   MRN:  [id number]   Chief Complaint: Follow-up; Hypertension; and Diabetes Hypertension This is a chronic problem. The current episode started more than 1 year ago. The problem has been waxing and waning since onset. The problem is controlled. Associated symptoms include blurred vision and shortness of breath. Pertinent negatives include no chest pain, headaches, palpitations or peripheral edema. Risk factors for coronary artery disease include diabetes mellitus. Past treatments include ACE inhibitors. The current treatment provides moderate improvement. There are no compliance problems.   Diabetes She presents for her follow-up diabetic visit. She has type 2 diabetes mellitus. Pertinent negatives for hypoglycemia include no headaches. Associated symptoms include blurred vision. Pertinent negatives for diabetes include no chest pain, no fatigue, no polydipsia and no polyuria. There are no hypoglycemic complications. Symptoms are stable. Her breakfast blood glucose is taken between 6-7 am. Her breakfast blood glucose range is generally 90-110 mg/dl. An ACE inhibitor/angiotensin II receptor blocker is being taken. Eye exam is current.   Breast cancer - she is status post left lumpectomy followed by chemotherapy and then radiation. She is now on antiestrogen therapy. She is having significant left breast pains which she describes as shooting burning pains. Oncology prescribed Percocet which has helped some.  HM - Oncology requested that she have a Pap and pelvic exam due to discomfort.  Her last pap was in 2015 and reported normal.  She denies vaginal bleeding, pain with intercourse, dysuria or hematuria.   Review of Systems  Constitutional: Negative for fever, chills, diaphoresis and fatigue.  Eyes: Positive for blurred vision.  Respiratory: Positive for shortness of breath. Negative for cough, choking, chest tightness and wheezing.     Cardiovascular: Negative for chest pain, palpitations and leg swelling.  Gastrointestinal: Positive for abdominal pain (general discomfort). Negative for constipation and blood in stool.  Endocrine: Negative for polydipsia and polyuria.  Genitourinary: Negative for urgency, hematuria, vaginal bleeding, vaginal discharge, genital sores and pelvic pain.  Skin: Positive for wound (foot callus). Negative for color change and rash.  Neurological: Negative for light-headedness, numbness and headaches.  Psychiatric/Behavioral: Negative for sleep disturbance and dysphoric mood.    Patient Active Problem List   Diagnosis Date Noted  . Hot flash, menopausal 04/09/2015  . Controlled type 2 diabetes mellitus without complication ([place]) [id number]  . 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 Essential (primary) hypertension 03/25/2015  . 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 Ventral hernia without obstruction or gangrene 03/25/2015  . Osteopenia 03/12/2015  . Hydradenitis 01/23/2015  . Boil of upper extremity 10/20/2014  . Closed fracture of greater tuberosity of humerus 01/13/2014  . 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 Authorizing Provider  amLODipine (NORVASC) 5 MG tablet TAKE 1 TABLET EVERY DAY 05/06/15  Yes Glean Hess, MD  anastrozole (ARIMIDEX) 1 MG tablet TAKE ONE (1) TABLET BY MOUTH EVERY DAY 05/01/15  Yes Lequita Asal, MD  BAYER BREEZE 2 TEST DISK  10/17/14  Yes Historical Provider, MD  Cholecalciferol (VITAMIN D3) 1000 UNITS CAPS Take 1 capsule by mouth every morning.   Yes Historical Provider, MD  clindamycin (CLEOCIN-T) 1 % lotion Apply topically 2 (two) times daily. 01/23/15  Yes Clayburn Pert, MD  cyclobenzaprine (FLEXERIL) 5 MG tablet Take 1 tablet (5 mg total) by mouth 3 (three)  times daily as needed for muscle spasms. 05/27/15  Yes Evlyn Kanner, NP   lisinopril (PRINIVIL,ZESTRIL) 20 MG tablet TAKE 1 TABLET EVERY DAY 05/06/15  Yes Glean Hess, MD  metFORMIN (GLUCOPHAGE) 500 MG tablet Take 1 tablet (500 mg total) by mouth daily. Every am 06/09/15  Yes Glean Hess, MD  [place]  10/17/14  Yes Historical Provider, MD  oxyCODONE-acetaminophen (ROXICET) 5-325 MG tablet Take 1 tablet by mouth every 6 (six) hours as needed for severe pain. 06/02/15  Yes Lequita Asal, MD  traMADol (ULTRAM) 50 MG tablet Take 1 tablet (50 mg total) by mouth every 6 (six) hours as needed. 05/27/15  Yes Evlyn Kanner, NP    No Known Allergies  Past Surgical History  Procedure Laterality Date  . Cholecystectomy    . Tubal ligation Bilateral   . Breast lumpectomy with needle localization and axillary sentinel lymph node bx Left 08/27/14  . Breast biopsy Left   . Esophagogastroduodenoscopy  2014    gastritis  . Ventral hernia repair N/A 04/10/2015    Procedure: HERNIA REPAIR VENTRAL ADULT;  Surgeon: Leonie Green, MD;  Location: [facility] ORS;  Service: General;  Laterality: N/A;    Social History  Substance Use Topics  . Smoking status: Current Every Day Smoker -- 0.50 packs/day for 38 years    Types: Cigarettes  . Smokeless tobacco: Never Used  . Alcohol Use: 0.0 - 0.6 oz/week    0-1 Cans of beer per week     Comment: Occasionally      Medication list has been reviewed and updated.   Physical Exam  Constitutional: She is oriented to person, place, and time. She appears well-developed and well-nourished. No distress.  HENT:  Head: Normocephalic and atraumatic.  Neck: Normal range of motion. No thyromegaly present.  Cardiovascular: Normal rate, regular rhythm and normal heart sounds.   Pulses:      Dorsalis pedis pulses are 2+ on the right side, and 2+ on the left side.       Posterior tibial pulses are 1+ on the right side, and 1+ on the left side.  Pulmonary/Chest: Effort normal and breath sounds normal. No respiratory  distress.  Abdominal: There is no hepatosplenomegaly. There is generalized tenderness. There is no rigidity and no guarding.  Musculoskeletal: Normal range of motion. She exhibits no edema.  Neurological: She is alert and oriented to person, place, and time. A sensory deficit is present.  Skin: Skin is warm and dry. No rash noted.  Pre-ulcerative callus of both feet  Nails thickened on great and second toes bilat  Psychiatric: She has a normal mood and affect. Her behavior is normal. Thought content normal.    BP 146/88 mmHg  Pulse 88  Ht 5\' 8"  (1.727 m)  Wt 207 lb (93.895 kg)  BMI 31.48 kg/m2  Assessment and Plan: 1. Essential (primary) hypertension Fair control - will increase lisinopril to 40 mg (may take 2 20 mg tabs until rx arrives) Follow up one month - lisinopril (PRINIVIL,ZESTRIL) 40 MG tablet; Take 1 tablet (40 mg total) by mouth daily.  Dispense: 90 tablet; Refill: 3  2. Controlled type 2 diabetes mellitus without complication, with long-term current use of insulin ([facility]) Begin gabapentin - can titrate up if needed - gabapentin (NEURONTIN) 100 MG capsule; Take 1 capsule (100 mg total) by mouth 3 (three) times daily.  Dispense: 270 capsule; Refill: 0 - Hemoglobin A1c - Ambulatory referral to Podiatry  3. Infiltrating  ductal carcinoma of left female breast ([place]) Having residual pain thought to be from extensive bruising vs XRT Patient continue to follow up with Oncology Gabapentin may be of benefit here as well  4. Pre-ulcerative calluses Needs professional care - Ambulatory referral to Podiatry  5. 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

## 2015-06-23 ENCOUNTER — Telehealth: Payer: Self-pay

## 2015-06-23 LAB — HEMOGLOBIN A1C
Est. average glucose Bld gHb Est-mCnc: 131 mg/dL
Hgb A1c MFr Bld: 6.2 % — ABNORMAL HIGH (ref 4.8–5.6)

## 2015-06-23 NOTE — Telephone Encounter (Signed)
Spoke with patient. Patient advised of all results and verbalized understanding. Will call back with any future questions or concerns. MAH  

## 2015-06-23 NOTE — Telephone Encounter (Signed)
-----   Message from Glean Hess, MD sent at 06/23/2015  1:02 PM EST ----- DM control is good.  Continue same medication.

## 2015-06-24 ENCOUNTER — Ambulatory Visit: Payer: Commercial Managed Care - HMO | Admitting: Occupational Therapy

## 2015-07-06 ENCOUNTER — Encounter: Payer: Self-pay | Admitting: Hematology and Oncology

## 2015-07-09 ENCOUNTER — Ambulatory Visit: Payer: Commercial Managed Care - HMO | Admitting: Hematology and Oncology

## 2015-07-09 ENCOUNTER — Other Ambulatory Visit: Payer: Commercial Managed Care - HMO

## 2015-07-15 ENCOUNTER — Other Ambulatory Visit: Payer: Self-pay | Admitting: Internal Medicine

## 2015-07-15 DIAGNOSIS — M79671 Pain in right foot: Secondary | ICD-10-CM | POA: Diagnosis not present

## 2015-07-15 DIAGNOSIS — B351 Tinea unguium: Secondary | ICD-10-CM | POA: Diagnosis not present

## 2015-07-15 DIAGNOSIS — E119 Type 2 diabetes mellitus without complications: Secondary | ICD-10-CM | POA: Diagnosis not present

## 2015-07-15 DIAGNOSIS — D2371 Other benign neoplasm of skin of right lower limb, including hip: Secondary | ICD-10-CM | POA: Diagnosis not present

## 2015-07-15 DIAGNOSIS — M79672 Pain in left foot: Secondary | ICD-10-CM | POA: Diagnosis not present

## 2015-07-20 ENCOUNTER — Ambulatory Visit (INDEPENDENT_AMBULATORY_CARE_PROVIDER_SITE_OTHER): Payer: Commercial Managed Care - HMO | Admitting: Internal Medicine

## 2015-07-20 ENCOUNTER — Encounter: Payer: Self-pay | Admitting: Internal Medicine

## 2015-07-20 VITALS — BP 158/86 | HR 88 | Ht 68.0 in | Wt 207.0 lb

## 2015-07-20 DIAGNOSIS — R1032 Left lower quadrant pain: Secondary | ICD-10-CM | POA: Diagnosis not present

## 2015-07-20 DIAGNOSIS — Z124 Encounter for screening for malignant neoplasm of cervix: Secondary | ICD-10-CM | POA: Diagnosis not present

## 2015-07-20 DIAGNOSIS — K582 Mixed irritable bowel syndrome: Secondary | ICD-10-CM | POA: Insufficient documentation

## 2015-07-20 NOTE — Progress Notes (Signed)
Date:  07/20/2015   Name:  Donna Riley   DOB:  [birth year]   MRN:  [id number]   Chief Complaint: Vaginal Pain Vaginal Pain The patient's primary symptoms include pelvic pain. The patient's pertinent negatives include no genital itching, genital lesions, genital odor, vaginal bleeding or vaginal discharge. This is a chronic problem. The current episode started 1 to 4 weeks ago. The problem occurs daily. The problem has been unchanged. Associated symptoms include abdominal pain, constipation and diarrhea. Pertinent negatives include no back pain, chills, dysuria, fever, rash or vomiting. She is not sexually active.   Abdominal discomfort - patient has intermittent lower abdominal discomfort.  She had diarrhea alternating with constipation. She denies weight loss or blood in the stool.  She has had a colonoscopy many years ago.  She was advised to come here by Oncology for pap smear to evaluate the abdominal pain.   Review of Systems  Constitutional: Negative for fever, chills and fatigue.  Respiratory: Negative for chest tightness, shortness of breath and wheezing.   Cardiovascular: Negative for chest pain and palpitations.  Gastrointestinal: Positive for abdominal pain, diarrhea and constipation. Negative for vomiting, blood in stool and abdominal distention.  Genitourinary: Positive for vaginal pain (dryness) and pelvic pain. Negative for dysuria, vaginal bleeding, vaginal discharge and difficulty urinating.  Musculoskeletal: Negative for back pain.  Skin: Negative for rash.    Patient Active Problem List   Diagnosis Date Noted  . Controlled type 2 diabetes mellitus without complication, with long-term current use of insulin ([place]) 06/22/2015  . Pre-ulcerative calluses 06/22/2015  . Neuropathy ([place]) 06/22/2015  . Hot flash, menopausal 04/09/2015  . 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 Essential (primary) hypertension 03/25/2015  . Abnormal result  of Mantoux test 03/25/2015  . Osteopenia 03/12/2015  . Hydradenitis 01/23/2015  . Boil of upper extremity 10/20/2014  . Closed fracture of greater tuberosity of humerus 01/13/2014  . 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 Authorizing Provider  amLODipine (NORVASC) 5 MG tablet TAKE 1 TABLET EVERY DAY 05/06/15  Yes Glean Hess, MD  anastrozole (ARIMIDEX) 1 MG tablet TAKE ONE (1) TABLET BY MOUTH EVERY DAY 05/01/15  Yes Lequita Asal, MD  BAYER BREEZE 2 TEST DISK  10/17/14  Yes Historical Provider, MD  Cholecalciferol (VITAMIN D3) 1000 UNITS CAPS Take 1 capsule by mouth every morning.   Yes Historical Provider, MD  clindamycin (CLEOCIN-T) 1 % lotion Apply topically 2 (two) times daily. 01/23/15  Yes Clayburn Pert, MD  cyclobenzaprine (FLEXERIL) 5 MG tablet Take 1 tablet (5 mg total) by mouth 3 (three) times daily as needed for muscle spasms. 05/27/15  Yes Evlyn Kanner, NP  gabapentin (NEURONTIN) 100 MG capsule Take 1 capsule (100 mg total) by mouth 3 (three) times daily. 06/22/15  Yes Glean Hess, MD  lisinopril (PRINIVIL,ZESTRIL) 40 MG tablet Take 1 tablet (40 mg total) by mouth daily. 06/22/15  Yes Glean Hess, MD  metFORMIN (GLUCOPHAGE) 500 MG tablet TAKE ONE TABLET BY MOUTH EVERY MORNING 07/15/15  Yes Glean Hess, MD  MICROLET LANCETS [place]  10/17/14  Yes Historical Provider, MD  oxyCODONE-acetaminophen (ROXICET) 5-325 MG tablet Take 1 tablet by mouth every 6 (six) hours as needed for severe pain. 06/02/15  Yes Lequita Asal, MD  traMADol (ULTRAM) 50 MG tablet Take 1 tablet (50 mg total) by mouth every 6 (six)  hours as needed. 05/27/15  Yes Evlyn Kanner, NP    No Known Allergies  Past Surgical History  Procedure Laterality Date  . Cholecystectomy    . Tubal ligation Bilateral   . Breast lumpectomy with needle localization and axillary sentinel lymph node bx Left 08/27/14  . Breast  biopsy Left   . Esophagogastroduodenoscopy  2014    gastritis  . Ventral hernia repair N/A 04/10/2015    Procedure: HERNIA REPAIR VENTRAL ADULT;  Surgeon: Leonie Green, MD;  Location: [facility] ORS;  Service: General;  Laterality: N/A;    Social History  Substance Use Topics  . Smoking status: Current Every Day Smoker -- 0.50 packs/day for 38 years    Types: Cigarettes  . Smokeless tobacco: Never Used  . Alcohol Use: 0.0 - 0.6 oz/week    0-1 Cans of beer per week     Comment: Occasionally      Medication list has been reviewed and updated.   Physical Exam  Constitutional: She is oriented to person, place, and time. She appears well-developed. No distress.  HENT:  Head: Normocephalic and atraumatic.  Cardiovascular: Normal rate, regular rhythm and normal heart sounds.   Pulmonary/Chest: Effort normal and breath sounds normal. No respiratory distress.  Abdominal: Soft. Normal appearance and bowel sounds are normal. There is tenderness in the left lower quadrant. There is no rigidity, no rebound, no guarding and no CVA tenderness.  Genitourinary: Rectum normal, vagina normal and uterus normal. There is no rash, tenderness or lesion on the right labia. There is no rash, tenderness or lesion on the left labia. Uterus is not tender. Cervix exhibits no motion tenderness, no discharge and no friability. Right adnexum displays no mass, no tenderness and no fullness. Left adnexum displays tenderness. Left adnexum displays no mass and no fullness.  Neurological: She is alert and oriented to person, place, and time.  Skin: Skin is warm and dry. No rash noted.  Psychiatric: She has a normal mood and affect. Her behavior is normal. Thought content normal.  Nursing note and vitals reviewed.   BP 158/86 mmHg  Pulse 88  Ht 5\' 8"  (1.727 m)  Wt 207 lb (93.895 kg)  BMI 31.48 kg/m2  Assessment and Plan: 1. Encounter for screening for cervical cancer  Normal exam except for LLQ/adnexal  discomfort Suspect IBS rather than pelvic pathology Would recommend [place] if symptoms persist after several weeks of fiber supplements for IBS - Pap IG and HPV (high risk) DNA detection  2. Irritable bowel syndrome with both constipation and diarrhea Fiber supplement daily - Ambulatory referral to Gastroenterology   Halina Maidens, MD [facility]  07/20/2015

## 2015-07-21 ENCOUNTER — Telehealth: Payer: Self-pay | Admitting: *Deleted

## 2015-07-21 ENCOUNTER — Other Ambulatory Visit: Payer: Self-pay

## 2015-07-21 MED ORDER — METFORMIN HCL 500 MG PO TABS: 500.0000 mg | ORAL_TABLET | Freq: Every morning | ORAL | Status: DC

## 2015-07-21 NOTE — Telephone Encounter (Signed)
Message left for patient Donna Riley earlier regarding eligibility to participate in clinical trials. Patient returned call as per request and discussed with her by phone that she may qualify for the PALLAS study receiving the study drug Ibrance along with her hormone suppression therapy for 2 years, or the BWEL trial in which she will have nutrition and exercise intervention for a two year period of time. [name] inquired about whether the study would pay for her gas to come to and from appointments, and when she was informed that they would not, she declined interest in learning more about either study. She did states that she may possibly be interested in other research studies in the future and would like to be contacted. Yolande Jolly, BSN, MHA, OCN 07/21/2015 2:54 PM

## 2015-07-21 NOTE — Telephone Encounter (Signed)
Received fax from [facility] mail order pharmacy.

## 2015-07-25 LAB — PAP IG AND HPV HIGH-RISK
HPV, high-risk: NEGATIVE
PAP Smear Comment: 0

## 2015-07-27 ENCOUNTER — Telehealth: Payer: Self-pay

## 2015-07-27 NOTE — Telephone Encounter (Signed)
Spoke with patient. Patient advised of all results and verbalized understanding. Will call back with any future questions or concerns. MAH  

## 2015-07-27 NOTE — Telephone Encounter (Signed)
-----   Message from Glean Hess, MD sent at 07/25/2015 10:08 AM EST ----- Pap is normal.  HPV negative.

## 2015-07-29 ENCOUNTER — Telehealth: Payer: Self-pay

## 2015-07-29 ENCOUNTER — Other Ambulatory Visit: Payer: Self-pay

## 2015-07-29 DIAGNOSIS — Z1211 Encounter for screening for malignant neoplasm of colon: Secondary | ICD-10-CM

## 2015-07-29 MED ORDER — PEG 3350-KCL-NA BICARB-NACL 420 G PO SOLR: 4000.0000 mL | ORAL | Status: DC

## 2015-07-29 NOTE — Telephone Encounter (Signed)
Gastroenterology Pre-Procedure Review  Request Date: 08/28/15 Requesting Physician: Dr. Army Melia  PATIENT REVIEW QUESTIONS: The patient responded to the following health history questions as indicated:    1. Are you having any GI issues? yes (constipation) 2. Do you have a personal history of Polyps? no 3. Do you have a family history of Colon Cancer or Polyps? no 4. Diabetes Mellitus? yes (Type 2) 5. Joint replacements in the past 12 months?no 6. Major health problems in the past 3 months?no 7. Any artificial heart valves, MVP, or defibrillator?no    MEDICATIONS & ALLERGIES:    Patient reports the following regarding taking any anticoagulation/antiplatelet therapy:   Plavix, Coumadin, Eliquis, Xarelto, Lovenox, Pradaxa, Brilinta, or Effient? no Aspirin? no  Patient confirms/reports the following medications:  Current Outpatient Prescriptions  Medication Sig Dispense Refill  . amLODipine (NORVASC) 5 MG tablet TAKE 1 TABLET EVERY DAY 90 tablet 0  . anastrozole (ARIMIDEX) 1 MG tablet TAKE ONE (1) TABLET BY MOUTH EVERY DAY 30 tablet 1  . BAYER BREEZE 2 TEST DISK     . Cholecalciferol (VITAMIN D3) 1000 UNITS CAPS Take 1 capsule by mouth every morning.    . clindamycin (CLEOCIN-T) 1 % lotion Apply topically 2 (two) times daily. 60 mL 0  . cyclobenzaprine (FLEXERIL) 5 MG tablet Take 1 tablet (5 mg total) by mouth 3 (three) times daily as needed for muscle spasms. 30 tablet 0  . gabapentin (NEURONTIN) 100 MG capsule Take 1 capsule (100 mg total) by mouth 3 (three) times daily. 270 capsule 0  . lisinopril (PRINIVIL,ZESTRIL) 40 MG tablet Take 1 tablet (40 mg total) by mouth daily. 90 tablet 3  . metFORMIN (GLUCOPHAGE) 500 MG tablet Take 1 tablet (500 mg total) by mouth every morning. 90 tablet 3  . MICROLET LANCETS MISC     . oxyCODONE-acetaminophen (ROXICET) 5-325 MG tablet Take 1 tablet by mouth every 6 (six) hours as needed for severe pain. 30 tablet 0  . traMADol (ULTRAM) 50 MG tablet  Take 1 tablet (50 mg total) by mouth every 6 (six) hours as needed. 30 tablet 0   No current facility-administered medications for this visit.    Patient confirms/reports the following allergies:  No Known Allergies  No orders of the defined types were placed in this encounter.    AUTHORIZATION INFORMATION Primary Insurance: 1D#: Group #:  Secondary Insurance: 1D#: Group #:  SCHEDULE INFORMATION: Date: 08/28/15 Time: Location: [place]

## 2015-07-30 ENCOUNTER — Ambulatory Visit
Admission: RE | Admit: 2015-07-30 | Discharge: 2015-07-30 | Disposition: A | Discharge status: Home / Self Care | Payer: Commercial Managed Care - HMO | Source: Ambulatory Visit | Attending: Hematology and Oncology | Admitting: Hematology and Oncology

## 2015-07-30 ENCOUNTER — Other Ambulatory Visit: Payer: Self-pay | Admitting: Hematology and Oncology

## 2015-07-30 DIAGNOSIS — Z853 Personal history of malignant neoplasm of breast: Secondary | ICD-10-CM | POA: Insufficient documentation

## 2015-07-30 DIAGNOSIS — C50912 Malignant neoplasm of unspecified site of left female breast: Secondary | ICD-10-CM

## 2015-07-30 DIAGNOSIS — R928 Other abnormal and inconclusive findings on diagnostic imaging of breast: Secondary | ICD-10-CM | POA: Diagnosis not present

## 2015-08-04 ENCOUNTER — Other Ambulatory Visit: Payer: Self-pay

## 2015-08-04 MED ORDER — METFORMIN HCL 500 MG PO TABS: 500.0000 mg | ORAL_TABLET | Freq: Every morning | ORAL | Status: DC

## 2015-08-04 NOTE — Telephone Encounter (Signed)
Received fax from pharmacy.

## 2015-08-07 ENCOUNTER — Inpatient Hospital Stay (HOSPITAL_BASED_OUTPATIENT_CLINIC_OR_DEPARTMENT_OTHER): Payer: Commercial Managed Care - HMO | Admitting: Hematology and Oncology

## 2015-08-07 ENCOUNTER — Inpatient Hospital Stay: Payer: Commercial Managed Care - HMO | Attending: Hematology and Oncology

## 2015-08-07 VITALS — BP 154/87 | HR 88 | Temp 98.2°F | Resp 18 | Ht 68.0 in | Wt 206.7 lb

## 2015-08-07 DIAGNOSIS — Z923 Personal history of irradiation: Secondary | ICD-10-CM | POA: Diagnosis not present

## 2015-08-07 DIAGNOSIS — Z7984 Long term (current) use of oral hypoglycemic drugs: Secondary | ICD-10-CM | POA: Diagnosis not present

## 2015-08-07 DIAGNOSIS — Z170 Estrogen receptor positive status [ER+]: Secondary | ICD-10-CM | POA: Diagnosis not present

## 2015-08-07 DIAGNOSIS — C50912 Malignant neoplasm of unspecified site of left female breast: Secondary | ICD-10-CM | POA: Insufficient documentation

## 2015-08-07 DIAGNOSIS — L732 Hidradenitis suppurativa: Secondary | ICD-10-CM

## 2015-08-07 DIAGNOSIS — M1990 Unspecified osteoarthritis, unspecified site: Secondary | ICD-10-CM | POA: Insufficient documentation

## 2015-08-07 DIAGNOSIS — F17210 Nicotine dependence, cigarettes, uncomplicated: Secondary | ICD-10-CM | POA: Insufficient documentation

## 2015-08-07 DIAGNOSIS — E119 Type 2 diabetes mellitus without complications: Secondary | ICD-10-CM | POA: Diagnosis not present

## 2015-08-07 DIAGNOSIS — M8580 Other specified disorders of bone density and structure, unspecified site: Secondary | ICD-10-CM | POA: Diagnosis not present

## 2015-08-07 DIAGNOSIS — Z79899 Other long term (current) drug therapy: Secondary | ICD-10-CM | POA: Diagnosis not present

## 2015-08-07 DIAGNOSIS — Z79811 Long term (current) use of aromatase inhibitors: Secondary | ICD-10-CM

## 2015-08-07 DIAGNOSIS — I10 Essential (primary) hypertension: Secondary | ICD-10-CM | POA: Diagnosis not present

## 2015-08-07 DIAGNOSIS — N6452 Nipple discharge: Secondary | ICD-10-CM | POA: Diagnosis not present

## 2015-08-07 DIAGNOSIS — N951 Menopausal and female climacteric states: Secondary | ICD-10-CM

## 2015-08-07 DIAGNOSIS — Z9012 Acquired absence of left breast and nipple: Secondary | ICD-10-CM | POA: Insufficient documentation

## 2015-08-07 LAB — CBC WITH DIFFERENTIAL/PLATELET
Basophils Absolute: 0.1 10*3/uL (ref 0–0.1)
Basophils Relative: 1 %
Eosinophils Absolute: 0.2 10*3/uL (ref 0–0.7)
Eosinophils Relative: 3 %
HCT: 41.6 % (ref 35.0–47.0)
Hemoglobin: 14.1 g/dL (ref 12.0–16.0)
Lymphocytes Relative: 30 %
Lymphs Abs: 1.9 10*3/uL (ref 1.0–3.6)
MCH: 27.7 pg (ref 26.0–34.0)
MCHC: 33.8 g/dL (ref 32.0–36.0)
MCV: 82 fL (ref 80.0–100.0)
Monocytes Absolute: 0.3 10*3/uL (ref 0.2–0.9)
Monocytes Relative: 6 %
Neutro Abs: 3.8 10*3/uL (ref 1.4–6.5)
Neutrophils Relative %: 60 %
Platelets: 205 10*3/uL (ref 150–440)
RBC: 5.08 MIL/uL (ref 3.80–5.20)
RDW: 14.2 % (ref 11.5–14.5)
WBC: 6.3 10*3/uL (ref 3.6–11.0)

## 2015-08-07 LAB — COMPREHENSIVE METABOLIC PANEL
ALT: 18 U/L (ref 14–54)
AST: 16 U/L (ref 15–41)
Albumin: 4.3 g/dL (ref 3.5–5.0)
Alkaline Phosphatase: 87 U/L (ref 38–126)
Anion gap: 8 (ref 5–15)
BUN: 12 mg/dL (ref 6–20)
CO2: 26 mmol/L (ref 22–32)
Calcium: 9.2 mg/dL (ref 8.9–10.3)
Chloride: 101 mmol/L (ref 101–111)
Creatinine, Ser: 0.83 mg/dL (ref 0.44–1.00)
GFR calc Af Amer: >60 mL/min (ref >60)
GFR calc non Af Amer: >60 mL/min (ref >60)
Glucose, Bld: 117 mg/dL — ABNORMAL HIGH (ref 65–99)
Potassium: 3.9 mmol/L (ref 3.5–5.1)
Sodium: 135 mmol/L (ref 135–145)
Total Bilirubin: 0.5 mg/dL (ref 0.3–1.2)
Total Protein: 7.9 g/dL (ref 6.5–8.1)

## 2015-08-07 MED ORDER — OXYCODONE-ACETAMINOPHEN 5-325 MG PO TABS: 1.0000 | ORAL_TABLET | Freq: Four times a day (QID) | ORAL | Status: DC | PRN

## 2015-08-07 NOTE — Progress Notes (Signed)
[facility]-  Cancer Center  Clinic day:  08/07/2015   Chief Complaint: Donna Riley is a 55 y.o. 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  HPI: The patient was last seen in the medical oncology clinic on 06/02/2015.  At that time, she had some achiness due to Arimidex.  She had chest wall tightness due to radiation.  Exam revealed left breast post radiation changes.  She had a left axillary cyst secondary to hidradenitis suppurativa.  Labs included a normal CBC with diff, CMP, and CA27.29 (12.1).  She underwent bilateral diagnostic mammogram on 07/30/2015.  Imaging revealed no evidence of malignancy.  During the interim, she notes leaking of some bloody fluid from her left nipple.  She notes discomfort on her left side.    She feels tightness associated with  her chest wall.  She notes limitation in her range of motion.  She notes some sleep issues.  She notes that she had the flu.  She had a negative pelvic exam and PAP smear in 07/2015.  She has a colonoscopy scheduled for 03/3/12017.   Past Medical History  Diagnosis Date  . Prediabetes   . Elevated LFTs   . Positive PPD, treated   . Hypertension   . Arthritis   . Diabetes mellitus without complication (HCC) 06/2014  . Breast cancer (HCC) 2016    left- chemo/radiation    Past Surgical History  Procedure Laterality Date  . Cholecystectomy    . Tubal ligation Bilateral   . Breast lumpectomy with needle localization and axillary sentinel lymph node bx Left 08/27/14  . Breast biopsy Left   . Esophagogastroduodenoscopy  2014    gastritis  . Ventral hernia repair N/A 04/10/2015    Procedure: HERNIA REPAIR VENTRAL ADULT;  Surgeon: Nadeen Landau, MD;  Location: [facility] ORS;  Service: General;  Laterality: N/A;    Family History  Problem Relation Age of Onset  . Cancer Mother     Pancreatic  . Cancer  Father     Throat  . Cancer Maternal Grandmother     Breast  . Breast cancer Maternal Grandmother 61    Social History:  reports that she has been smoking Cigarettes.  She has a 19 pack-year smoking history. She has never used smokeless tobacco. She reports that she drinks alcohol. She reports that she does not use illicit drugs.  The patient is alone today.  Allergies: No Known Allergies  Current Medications: Current Outpatient Prescriptions  Medication Sig Dispense Refill  . anastrozole (ARIMIDEX) 1 MG tablet TAKE ONE (1) TABLET BY MOUTH EVERY DAY 30 tablet 1  . BAYER BREEZE 2 TEST DISK     . Cholecalciferol (VITAMIN D3) 1000 UNITS CAPS Take 1 capsule by mouth every morning.    . clindamycin (CLEOCIN-T) 1 % lotion Apply topically 2 (two) times daily. 60 mL 0  . cyclobenzaprine (FLEXERIL) 5 MG tablet Take 1 tablet (5 mg total) by mouth 3 (three) times daily as needed for muscle spasms. 30 tablet 0  . gabapentin (NEURONTIN) 100 MG capsule Take 1 capsule (100 mg total) by mouth 3 (three) times daily. 270 capsule 0  . metFORMIN (GLUCOPHAGE) 500 MG tablet Take 1 tablet (500 mg total) by mouth every morning. 90 tablet 1  . MICROLET LANCETS MISC     . polyethylene glycol-electrolytes (TRILYTE) 420 g solution Take 4,000 mLs by mouth  as directed. Drink one 8oz glass every 30 mins until stools 4000 mL 0  . traMADol (ULTRAM) 50 MG tablet Take 1 tablet (50 mg total) by mouth every 6 (six) hours as needed. 30 tablet 0  . amLODipine (NORVASC) 5 MG tablet     . lisinopril (PRINIVIL,ZESTRIL) 40 MG tablet Take 1 tablet (40 mg total) by mouth daily. (Patient not taking: Reported on 08/07/2015) 90 tablet 3  . oxyCODONE-acetaminophen (ROXICET) 5-325 MG tablet Take 1 tablet by mouth every 6 (six) hours as needed for severe pain. (Patient not taking: Reported on 08/07/2015) 30 tablet 0   No current facility-administered medications for this visit.    Review of Systems:  GENERAL: Feels "ok". No fevers or  sweats.  Weight down 5 pounds. PERFORMANCE STATUS (ECOG): 1 HEENT: No visual changes, runny nose, sore throat, mouth sores or tenderness. Lungs: Shortness of breath with exertion.  No cough. No hemoptysis. Cardiac: No chest pain, palpitations, orthopnea, or PND. Breast: Left breast darker post radiation.  Left nipple discharge (bloody).  Chest tightness which helps by pressing her arms inward.  No skin changes. GI: Constipation.  No nausea, vomiting, melena or hematochezia. GU: No urgency, frequency, dysuria, or hematuria. Musculoskeletal: Tight across chest post surgery and radiation.  No back pain. No joint pain. No muscle tenderness. Extremities: No pain or swelling. Skin: No skin changes, rashes or ulcers. Neuro:No headache, numbness or weakness, balance or coordination issues. Endocrine: Hot flashes.  No diabetes, thyroid issues or night sweats. Psych: Trouble sleeping at night.  No mood changes, depression or anxiety. Pain: No focal pain. Review of systems: All other systems reviewed and found to be negative.  Physical Exam: Blood pressure 154/87, pulse 88, temperature 98.2 F (36.8 C), temperature source Tympanic, resp. rate 18, height '5\' 8"'$  (1.727 m), weight 206 lb 10.9 oz (93.75 kg). GENERAL: Well developed, well nourished, sitting comfortably in the exam room in no acute distress. MENTAL STATUS: Alert and oriented to person, place and time. HEAD: Long dark brown hair. Normocephalic, atraumatic, face symmetric, no Cushingoid features. EYES: Brown eyes. Pupils equal round and reactive to light and accomodation. No conjunctivitis or scleral icterus. ENT: Oropharynx clear without lesion. Tongue normal. Mucous membranes moist.  RESPIRATORY: Clear to auscultation without rales, wheezes or rhonchi. CARDIOVASCULAR: Regular rate and rhythm without murmur, rub or gallop. BREASTS:  Right breast without masses, skin changes or nipple discharge.  Left breast with  moderate hyperpigmentation.  Post surgical and post radiation changes with edema (pronounced lower inner quadrant).  Tender.  Crusting of nipple. ABDOMEN: Ventral hernia.  Soft, non-tender, with active bowel sounds, and no hepatosplenomegaly. No masses. SKIN:  Left axillary cyst secondary to hidradenitis suppurativa.  No rashes, ulcers or lesions. EXTREMITIES: No edema, no skin discoloration or tenderness. No palpable cords. LYMPH NODES: No palpable cervical, supraclavicular, axillary or inguinal adenopathy  NEUROLOGICAL: Unremarkable.  PSYCH: Appropriate.  Appointment on 08/07/2015  Component Date Value Ref Range Status  . WBC 08/07/2015 6.3  3.6 - 11.0 K/uL Final  . RBC 08/07/2015 5.08  3.80 - 5.20 MIL/uL Final  . Hemoglobin 08/07/2015 14.1  12.0 - 16.0 g/dL Final  . HCT 08/07/2015 41.6  35.0 - 47.0 % Final  . MCV 08/07/2015 82.0  80.0 - 100.0 fL Final  . MCH 08/07/2015 27.7  26.0 - 34.0 pg Final  . MCHC 08/07/2015 33.8  32.0 - 36.0 g/dL Final  . RDW 08/07/2015 14.2  11.5 - 14.5 % Final  . Platelets 08/07/2015 205  150 - 440 K/uL Final  . Neutrophils Relative % 08/07/2015 60   Final  . Neutro Abs 08/07/2015 3.8  1.4 - 6.5 K/uL Final  . Lymphocytes Relative 08/07/2015 30   Final  . Lymphs Abs 08/07/2015 1.9  1.0 - 3.6 K/uL Final  . Monocytes Relative 08/07/2015 6   Final  . Monocytes Absolute 08/07/2015 0.3  0.2 - 0.9 K/uL Final  . Eosinophils Relative 08/07/2015 3   Final  . Eosinophils Absolute 08/07/2015 0.2  0 - 0.7 K/uL Final  . Basophils Relative 08/07/2015 1   Final  . Basophils Absolute 08/07/2015 0.1  0 - 0.1 K/uL Final  . Sodium 08/07/2015 135  135 - 145 mmol/L Final  . Potassium 08/07/2015 3.9  3.5 - 5.1 mmol/L Final  . Chloride 08/07/2015 101  101 - 111 mmol/L Final  . CO2 08/07/2015 26  22 - 32 mmol/L Final  . Glucose, Bld 08/07/2015 117* 65 - 99 mg/dL Final  . BUN 08/07/2015 12  6 - 20 mg/dL Final  . Creatinine, Ser 08/07/2015 0.83  0.44 - 1.00 mg/dL Final  .  Calcium 08/07/2015 9.2  8.9 - 10.3 mg/dL Final  . Total Protein 08/07/2015 7.9  6.5 - 8.1 g/dL Final  . Albumin 08/07/2015 4.3  3.5 - 5.0 g/dL Final  . AST 08/07/2015 16  15 - 41 U/L Final  . ALT 08/07/2015 18  14 - 54 U/L Final  . Alkaline Phosphatase 08/07/2015 87  38 - 126 U/L Final  . Total Bilirubin 08/07/2015 0.5  0.3 - 1.2 mg/dL Final  . GFR calc non Af Amer 08/07/2015 >60  >60 mL/min Final  . GFR calc Af Amer 08/07/2015 >60  >60 mL/min Final   Comment: (NOTE) The eGFR has been calculated using the CKD EPI equation. This calculation has not been validated in all clinical situations. eGFR's persistently <60 mL/min signify possible Chronic Kidney Disease.   . Anion gap 08/07/2015 8  5 - 15 Final    Assessment:  Donna Riley is a 55 y.o. African American female with stage IIA left breast cancer status post partial mastectomy and sentinel lymph node biopsy on 08/27/2014. 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 There was lymphovascular invasion. One of 2 sentinel lymph nodes were positive (focus of 2.8 mm). Tumor was > 90% ER positive, > 90% PR positive, and Her2/neu negative. Pathologic stage was [id number].  Bone scan on 09/16/2014 revealed abnormal focal uptake at the level of right L3 pedicle and L5 vertebral body. Lumbar spine MRI on 09/27/2014 revealed no evidence of metastatic disease with lower lumbar facet arthritis. Abdominal and pelvic CT scan on 09/24/2014 revealed hepatomegaly and no evidence of metastatic disease.   Bone density study on 09/15/2014 revealed a T-score of -2.1 at L1-L4 (osteopenia).  She is on calcium and vitamin D.  She received 4 cycles of Taxotere and Cytoxan (09/29/2014 - 12/09/2014) with Neulasta support. She received 50.4 Gy to the left breast from 01/12/2015 until 02/23/2015.  She was started on Femara on 03/12/2015, but switched to Arimidex on 03/30/2015 secondary to  diffuse joint aches.    Bilateral diagnostic mammogram on 07/30/2015 revealed no evidence of malignancy.  Symptomatically, she has mild achiness and vasomotor symptoms due to Arimidex (tolerable).  She has chest wall tightness due to radiation.  She notes bloody nipple discharge.  Exam reveals left breast post radiation changes.  She has a left axillary  cyst secondary to hidradenitis suppurativa.  Plan: 1.  Labs today: CBC with diff, CMP, CA27.29. 2.  Review mammogram- done. 3.  Reschedule lymphedema clinic 4.  Continue Arimidex.   5.  Surgery consultation (Dr Azalee Course) [id number] nipple discharge.  6.  Rx:  Percocet 5/325 1 tablet po q 6 hrs prn pain (dis #30). 7.  RTC in 3 months for MD assessment and labs (CBC with diff, CMP, CA27.9).   Lequita Asal, MD  08/07/2015, 3:42 PM

## 2015-08-07 NOTE — Progress Notes (Signed)
Pt reports she still having leaking of blood and fluid from left breast.  Pt reported a fall in Jan 2017 while at sisters birthday party no loss of consciousness, or bruises and occurred after having a few alcoholic beverages.  No falls since that day .  Colonoscopy scheduled for the 3/31 for RLQ  Pain.  Pt has pain in left shoulder limited motion, armpit and arm area 9/10 on pain scale.  Didn't see PT because of having the flu.

## 2015-08-08 LAB — CANCER ANTIGEN 27.29: CA 27.29: 15.6 U/mL (ref 0.0–38.6)

## 2015-08-12 ENCOUNTER — Ambulatory Visit (INDEPENDENT_AMBULATORY_CARE_PROVIDER_SITE_OTHER): Payer: Commercial Managed Care - HMO | Admitting: Surgery

## 2015-08-12 ENCOUNTER — Encounter: Payer: Self-pay | Admitting: Surgery

## 2015-08-12 VITALS — BP 180/94 | Temp 97.4°F | Wt 206.0 lb

## 2015-08-12 DIAGNOSIS — C50912 Malignant neoplasm of unspecified site of left female breast: Secondary | ICD-10-CM

## 2015-08-12 NOTE — Progress Notes (Signed)
Subjective:     Patient ID: Donna Riley, female   DOB: [birth year], 55 y.o.   MRN: [id number]  HPI  55yr old female with left breast cancer, s/p Lumpectomy and SLN biopsy in 07/2014, with good margins but 1+ SLN on biopsy.  She has completed chemotherapy and completed radiation in Sept.  Patient states that her axilla and breast have been sore since that time.  She also states that she has had some nipple discharge from time to time since the radiation as well.  She did recently have a mammogram that was [facility] 2.  She also is having some diarrhea and abdominal pain, getting colonoscopy soon with Dr. Allen Norris.   Past Medical History  Diagnosis Date  . Prediabetes   . Elevated LFTs   . Positive PPD, treated   . Hypertension   . Arthritis   . Diabetes mellitus without complication ([place]) Q000111Q  . Breast cancer ([place]) 2016    left- chemo/radiation   Past Surgical History  Procedure Laterality Date  . Cholecystectomy    . Tubal ligation Bilateral   . Breast lumpectomy with needle localization and axillary sentinel lymph node bx Left 08/27/14  . Breast biopsy Left   . Esophagogastroduodenoscopy  2014    gastritis  . Ventral hernia repair N/A 04/10/2015    Procedure: HERNIA REPAIR VENTRAL ADULT;  Surgeon: Leonie Green, MD;  Location: [facility] ORS;  Service: General;  Laterality: N/A;   Family History  Problem Relation Age of Onset  . Cancer Mother     Pancreatic  . Cancer Father     Throat  . Cancer Maternal Grandmother     Breast  . Breast cancer Maternal Grandmother 75   Social History   Social History  . Marital Status: Married    Spouse Name: N/A  . Number of Children: N/A  . Years of Education: N/A   Social History Main Topics  . Smoking status: Current Every Day Smoker -- 0.50 packs/day for 38 years    Types: Cigarettes  . Smokeless tobacco: Never Used  . Alcohol Use: 0.0 - 0.6 oz/week    0-1 Cans of beer per week     Comment: Occasionally   . Drug Use: No  . Sexual  Activity: Not Asked   Other Topics Concern  . None   Social History Narrative    Current outpatient prescriptions:  .  amLODipine (NORVASC) 5 MG tablet, , Disp: , Rfl:  .  anastrozole (ARIMIDEX) 1 MG tablet, Take 1 mg by mouth daily., Disp: , Rfl:  .  BAYER BREEZE 2 TEST DISK, , Disp: , Rfl:  .  Cholecalciferol (VITAMIN D3) 1000 UNITS CAPS, Take 1 capsule by mouth every morning., Disp: , Rfl:  .  clindamycin (CLEOCIN-T) 1 % lotion, Apply topically 2 (two) times daily., Disp: 60 mL, Rfl: 0 .  lisinopril (PRINIVIL,ZESTRIL) 40 MG tablet, Take 1 tablet (40 mg total) by mouth daily., Disp: 90 tablet, Rfl: 3 .  metFORMIN (GLUCOPHAGE) 500 MG tablet, Take 1 tablet (500 mg total) by mouth every morning., Disp: 90 tablet, Rfl: 1 .  MICROLET LANCETS MISC, , Disp: , Rfl:  .  oxyCODONE-acetaminophen (ROXICET) 5-325 MG tablet, Take 1 tablet by mouth every 6 (six) hours as needed for severe pain., Disp: 30 tablet, Rfl: 0 .  polyethylene glycol-electrolytes (TRILYTE) 420 g solution, Take 4,000 mLs by mouth as directed. Drink one 8oz glass every 30 mins until stools, Disp: 4000 mL, Rfl: 0 No Known Allergies  Filed Vitals:   08/12/15 0819  BP: 180/94  Temp: 97.4 F (36.3 C)      Review of Systems  Constitutional: Negative for fever, activity change, appetite change and fatigue.  HENT: Negative for congestion and sore throat.   Respiratory: Negative for cough, chest tightness and shortness of breath.   Cardiovascular: Negative for chest pain and leg swelling.  Gastrointestinal: Negative for abdominal pain and abdominal distention.  Genitourinary: Negative for dysuria.  Musculoskeletal: Positive for myalgias. Negative for back pain.  Skin: Positive for color change. Negative for pallor, rash and wound.  Neurological: Negative for dizziness and weakness.  Psychiatric/Behavioral: Negative for decreased concentration and agitation.  All other systems reviewed and are negative.       Objective:   Physical Exam  Constitutional: She is oriented to person, place, and time. She appears well-developed and well-nourished. No distress.  HENT:  Head: Normocephalic and atraumatic.  Mouth/Throat: No oropharyngeal exudate.  Eyes: Conjunctivae and EOM are normal. Pupils are equal, round, and reactive to light. No scleral icterus.  Neck: Normal range of motion. Neck supple. No tracheal deviation present.  Cardiovascular: Normal rate, regular rhythm, normal heart sounds and intact distal pulses.  Exam reveals no gallop and no friction rub.   No murmur heard. Pulmonary/Chest: Effort normal and breath sounds normal. No respiratory distress. She has no wheezes. She has no rales.  Abdominal: Soft. Bowel sounds are normal. She exhibits no distension.  Neurological: She is alert and oriented to person, place, and time. No cranial nerve deficit.  Skin: Skin is warm and dry. No rash noted. No erythema.  Psychiatric: She has a normal mood and affect. Her behavior is normal. Judgment and thought content normal.  Vitals reviewed. RIght breast: normal exam without skin changes, masses, or axillary lymphadenopathy Left breast: with skin darkening around radiation area, on 8oclock position at nipple areola complex 1cm area of tenderness that she states has been that way since operation, rubbery, not hard.  No nipple discharge expressed, tenderness in axilla but no masses         Assessment:     55yr old female with left breast ca, completed chemo and radiaiton    Plan:     Will have her return in 6 months for repeat mammogram and if area still presistant with tender, firmness and nipple discharge will get biopsy at that time.  Patient is to call with any changes in the area as well.  She was instructed on exercises to increase mobility in the left arm as well as instructed to rub lotion on axilla and massage for about 5 mins twice daily to help soften the area.

## 2015-08-12 NOTE — Patient Instructions (Addendum)
I recommend for you to massage your left axillary with cocoa butter to help with the soreness. Remember to do the exercises that I showed you in the room daily twice a day.  If you continue to notice some drainage from your left breast in three months please let us know in case we need to order your mammogram before.  We will contact you to remind you when you are due for your 6 months mammogram.

## 2015-08-17 ENCOUNTER — Other Ambulatory Visit: Payer: Self-pay | Admitting: Internal Medicine

## 2015-08-24 ENCOUNTER — Encounter: Payer: Self-pay | Admitting: Hematology and Oncology

## 2015-08-26 ENCOUNTER — Other Ambulatory Visit: Payer: Self-pay

## 2015-08-26 DIAGNOSIS — Z1211 Encounter for screening for malignant neoplasm of colon: Secondary | ICD-10-CM

## 2015-08-26 MED ORDER — PEG 3350-KCL-NA BICARB-NACL 420 G PO SOLR: 4000.0000 mL | ORAL | Status: DC

## 2015-08-26 NOTE — Discharge Instructions (Signed)

## 2015-08-28 ENCOUNTER — Encounter
Admission: RE | Disposition: A | Discharge status: Home / Self Care | Payer: Self-pay | Source: Ambulatory Visit | Attending: Gastroenterology

## 2015-08-28 ENCOUNTER — Ambulatory Visit
Admission: RE | Admit: 2015-08-28 | Discharge: 2015-08-28 | Disposition: A | Discharge status: Home / Self Care | Payer: Commercial Managed Care - HMO | Source: Ambulatory Visit | Attending: Gastroenterology | Admitting: Gastroenterology

## 2015-08-28 ENCOUNTER — Ambulatory Visit: Payer: Commercial Managed Care - HMO | Admitting: Anesthesiology

## 2015-08-28 DIAGNOSIS — K641 Second degree hemorrhoids: Secondary | ICD-10-CM | POA: Insufficient documentation

## 2015-08-28 DIAGNOSIS — K621 Rectal polyp: Secondary | ICD-10-CM | POA: Insufficient documentation

## 2015-08-28 DIAGNOSIS — I10 Essential (primary) hypertension: Secondary | ICD-10-CM | POA: Diagnosis not present

## 2015-08-28 DIAGNOSIS — Z1211 Encounter for screening for malignant neoplasm of colon: Secondary | ICD-10-CM | POA: Insufficient documentation

## 2015-08-28 DIAGNOSIS — M19012 Primary osteoarthritis, left shoulder: Secondary | ICD-10-CM | POA: Diagnosis not present

## 2015-08-28 DIAGNOSIS — M19011 Primary osteoarthritis, right shoulder: Secondary | ICD-10-CM | POA: Diagnosis not present

## 2015-08-28 DIAGNOSIS — Z9049 Acquired absence of other specified parts of digestive tract: Secondary | ICD-10-CM | POA: Insufficient documentation

## 2015-08-28 DIAGNOSIS — M479 Spondylosis, unspecified: Secondary | ICD-10-CM | POA: Diagnosis not present

## 2015-08-28 DIAGNOSIS — D128 Benign neoplasm of rectum: Secondary | ICD-10-CM | POA: Diagnosis not present

## 2015-08-28 DIAGNOSIS — E119 Type 2 diabetes mellitus without complications: Secondary | ICD-10-CM | POA: Insufficient documentation

## 2015-08-28 DIAGNOSIS — F17210 Nicotine dependence, cigarettes, uncomplicated: Secondary | ICD-10-CM | POA: Diagnosis not present

## 2015-08-28 DIAGNOSIS — Z7984 Long term (current) use of oral hypoglycemic drugs: Secondary | ICD-10-CM | POA: Insufficient documentation

## 2015-08-28 DIAGNOSIS — Z9889 Other specified postprocedural states: Secondary | ICD-10-CM | POA: Insufficient documentation

## 2015-08-28 DIAGNOSIS — Z853 Personal history of malignant neoplasm of breast: Secondary | ICD-10-CM | POA: Insufficient documentation

## 2015-08-28 HISTORY — PX: POLYPECTOMY: SHX149

## 2015-08-28 HISTORY — DX: Reserved for inherently not codable concepts without codable children: IMO0001

## 2015-08-28 HISTORY — PX: COLONOSCOPY WITH PROPOFOL: SHX5780

## 2015-08-28 LAB — GLUCOSE, CAPILLARY
Glucose-Capillary: 106 mg/dL — ABNORMAL HIGH (ref 65–99)
Glucose-Capillary: 104 mg/dL — ABNORMAL HIGH (ref 65–99)

## 2015-08-28 SURGERY — COLONOSCOPY WITH PROPOFOL
Anesthesia: Monitor Anesthesia Care | Wound class: Contaminated

## 2015-08-28 MED ORDER — LACTATED RINGERS IV SOLN
INTRAVENOUS | Status: DC
  Administered 2015-08-28 (×2): via INTRAVENOUS

## 2015-08-28 MED ORDER — SODIUM CHLORIDE 0.9 % IV SOLN: INTRAVENOUS | Status: DC

## 2015-08-28 MED ORDER — OXYCODONE HCL 5 MG/5ML PO SOLN: 5.0000 mg | Freq: Once | ORAL | Status: DC | PRN

## 2015-08-28 MED ORDER — PROPOFOL 10 MG/ML IV BOLUS
INTRAVENOUS | Status: DC | PRN
  Administered 2015-08-28 (×7): 20 mg via INTRAVENOUS

## 2015-08-28 MED ORDER — OXYCODONE HCL 5 MG PO TABS: 5.0000 mg | ORAL_TABLET | Freq: Once | ORAL | Status: DC | PRN

## 2015-08-28 SURGICAL SUPPLY — 21 items
CANISTER SUCT 1200ML W/VALVE (MISCELLANEOUS) ×3 IMPLANT
CLIP HMST 235XBRD CATH ROT (MISCELLANEOUS) IMPLANT
CLIP RESOLUTION 360 11X235 (MISCELLANEOUS)
FCP ESCP3.2XJMB 240X2.8X (MISCELLANEOUS)
FORCEPS BIOP RAD 4 LRG CAP 4 (CUTTING FORCEPS) ×3 IMPLANT
FORCEPS BIOP RJ4 240 W/NDL (MISCELLANEOUS)
FORCEPS ESCP3.2XJMB 240X2.8X (MISCELLANEOUS) IMPLANT
GOWN CVR UNV OPN BCK APRN NK (MISCELLANEOUS) ×4 IMPLANT
GOWN ISOL THUMB LOOP REG UNIV (MISCELLANEOUS) ×2
INJECTOR VARIJECT VIN23 (MISCELLANEOUS) IMPLANT
KIT DEFENDO VALVE AND CONN (KITS) IMPLANT
KIT ENDO PROCEDURE OLY (KITS) ×3 IMPLANT
MARKER SPOT ENDO TATTOO 5ML (MISCELLANEOUS) IMPLANT
PAD GROUND ADULT SPLIT (MISCELLANEOUS) IMPLANT
PROBE APC STR FIRE (PROBE) ×3 IMPLANT
SNARE SHORT THROW 13M SML OVAL (MISCELLANEOUS) IMPLANT
SNARE SHORT THROW 30M LRG OVAL (MISCELLANEOUS) IMPLANT
SPOT EX ENDOSCOPIC TATTOO (MISCELLANEOUS)
VARIJECT INJECTOR VIN23 (MISCELLANEOUS)
WATER STERILE IRR 250ML POUR (IV SOLUTION) ×3 IMPLANT
WIDE-EYE POLYPTRAP (MISCELLANEOUS) IMPLANT

## 2015-08-28 NOTE — H&P (Signed)
[facility]  [address], [place], [place] [address] Phone: [phone] Fax : [phone]  Primary Care Physician:  Halina Maidens, MD Primary Gastroenterologist:  Dr. Allen Norris  Pre-Procedure History & Physical: HPI:  Donna Riley is a 55 y.o. female is here for a screening colonoscopy.   Past Medical History  Diagnosis Date  . Elevated LFTs   . Positive PPD, treated   . Hypertension   . Breast cancer ([place]) 2016    left- chemo/radiation  . Diabetes mellitus without complication ([place]) Q000111Q  . Shortness of breath dyspnea     with exertion  . Arthritis     shoulders and neck    Past Surgical History  Procedure Laterality Date  . Cholecystectomy    . Tubal ligation Bilateral   . Breast lumpectomy with needle localization and axillary sentinel lymph node bx Left 08/27/14  . Breast biopsy Left   . Esophagogastroduodenoscopy  2014    gastritis  . Ventral hernia repair N/A 04/10/2015    Procedure: HERNIA REPAIR VENTRAL ADULT;  Surgeon: Leonie Green, MD;  Location: [facility] ORS;  Service: General;  Laterality: N/A;    Prior to Admission medications   Medication Sig Start Date End Date Taking? Authorizing Provider  amLODipine (NORVASC) 5 MG tablet  05/10/15  Yes Historical Provider, MD  anastrozole (ARIMIDEX) 1 MG tablet Take 1 mg by mouth daily.   Yes Historical Provider, MD  clindamycin (CLEOCIN-T) 1 % lotion Apply topically 2 (two) times daily. 01/23/15  Yes Clayburn Pert, MD  BAYER BREEZE 2 TEST DISK  10/17/14   Historical Provider, MD  Cholecalciferol (VITAMIN D3) 1000 UNITS CAPS Take 1 capsule by mouth every morning. Reported on 08/28/2015    Historical Provider, MD  gabapentin (NEURONTIN) 100 MG capsule TAKE 1 CAPSULE THREE TIMES DAILY Patient not taking: Reported on 08/28/2015 08/18/15   Glean Hess, MD  lisinopril (PRINIVIL,ZESTRIL) 40 MG tablet Take 1 tablet (40 mg total) by mouth daily. Patient not taking: Reported on 08/28/2015 06/22/15    Glean Hess, MD  metFORMIN (GLUCOPHAGE) 500 MG tablet Take 1 tablet (500 mg total) by mouth every morning. 08/04/15   Glean Hess, MD  [place]  10/17/14   Historical Provider, MD  oxyCODONE-acetaminophen (ROXICET) 5-325 MG tablet Take 1 tablet by mouth every 6 (six) hours as needed for severe pain. 08/07/15   Lequita Asal, MD  polyethylene glycol-electrolytes (TRILYTE) 420 g solution Take 4,000 mLs by mouth as directed. Drink one 8oz glass every 30 mins until stools Patient not taking: Reported on 08/28/2015 08/26/15   Lucilla Lame, MD    Allergies as of 07/29/2015  . (No Known Allergies)    Family History  Problem Relation Age of Onset  . Cancer Mother     Pancreatic  . Cancer Father     Throat  . Cancer Maternal Grandmother     Breast  . Breast cancer Maternal Grandmother 75    Social History   Social History  . Marital Status: Married    Spouse Name: N/A  . Number of Children: N/A  . Years of Education: N/A   Occupational History  . Not on file.   Social History Main Topics  . Smoking status: Current Every Day Smoker -- 1.00 packs/day for 38 years    Types: Cigarettes  . Smokeless tobacco: Never Used  . Alcohol Use: 0.0 - 0.6 oz/week    0-1 Cans of beer per week  Comment: Occasionally   . Drug Use: No  . Sexual Activity: Not on file   Other Topics Concern  . Not on file   Social History Narrative    Review of Systems: See HPI, otherwise negative ROS  Physical Exam: Pulse 80  Temp(Src) 97.5 F (36.4 C) (Temporal)  Resp 17  Ht 5\' 8"  (1.727 m)  Wt 203 lb (92.08 kg)  BMI 30.87 kg/m2  SpO2 98% General:   Alert,  pleasant and cooperative in NAD Head:  Normocephalic and atraumatic. Neck:  Supple; no masses or thyromegaly. Lungs:  Clear throughout to auscultation.    Heart:  Regular rate and rhythm. Abdomen:  Soft, nontender and nondistended. Normal bowel sounds, without guarding, and without rebound.   Neurologic:  Alert and   oriented x4;  grossly normal neurologically.  Impression/Plan: Donna Riley is now here to undergo a screening colonoscopy.  Risks, benefits, and alternatives regarding colonoscopy have been reviewed with the patient.  Questions have been answered.  All parties agreeable.

## 2015-08-28 NOTE — Anesthesia Procedure Notes (Signed)
Procedure Name: MAC Performed by: Kess Mcilwain Pre-anesthesia Checklist: Patient identified, Emergency Drugs available, Suction available, Timeout performed and Patient being monitored Patient Re-evaluated:Patient Re-evaluated prior to inductionOxygen Delivery Method: Nasal cannula Placement Confirmation: positive ETCO2     

## 2015-08-28 NOTE — Anesthesia Preprocedure Evaluation (Signed)
Anesthesia Evaluation  Patient identified by MRN, date of birth, ID band Patient awake    Reviewed: Allergy & Precautions, H&P , NPO status , Patient's Chart, lab work & pertinent test results  History of Anesthesia Complications Negative for: history of anesthetic complications  Airway Mallampati: II  TM Distance: >3 FB Neck ROM: full    Dental  (+) Chipped, Missing Very poor dentition:   Pulmonary Current Smoker,    Pulmonary exam normal breath sounds clear to auscultation       Cardiovascular hypertension, On Medications Normal cardiovascular exam     Neuro/Psych    GI/Hepatic negative GI ROS, Neg liver ROS,   Endo/Other  diabetes, Well Controlled, Type 2  Renal/GU negative Renal ROS     Musculoskeletal   Abdominal   Peds  Hematology negative hematology ROS (+)   Anesthesia Other Findings   Reproductive/Obstetrics                             Anesthesia Physical Anesthesia Plan  ASA: II  Anesthesia Plan: MAC   Post-op Pain Management:  [facility] for Post-op pain   Induction:   Airway Management Planned:   Additional Equipment:   Intra-op Plan:   Post-operative Plan:   Informed Consent: I have reviewed the patients History and Physical, chart, labs and discussed the procedure including the risks, benefits and alternatives for the proposed anesthesia with the patient or authorized representative who has indicated his/her understanding and acceptance.     Plan Discussed with: CRNA  Anesthesia Plan Comments:         Anesthesia Quick Evaluation

## 2015-08-28 NOTE — Transfer of Care (Signed)
Immediate Anesthesia Transfer of Care Note  Patient: Donna Riley  Procedure(s) Performed: Procedure(s) with comments: COLONOSCOPY WITH PROPOFOL (N/A) - Diabetic oral POLYPECTOMY INTESTINAL - Rectal polyp  Patient Location: PACU  Anesthesia Type: MAC  Level of Consciousness: awake, alert  and patient cooperative  Airway and Oxygen Therapy: Patient Spontanous Breathing and Patient connected to supplemental oxygen  Post-op Assessment: Post-op Vital signs reviewed, Patient's Cardiovascular Status Stable, Respiratory Function Stable, Patent Airway and No signs of Nausea or vomiting  Post-op Vital Signs: Reviewed and stable  Complications: No apparent anesthesia complications

## 2015-08-28 NOTE — Anesthesia Postprocedure Evaluation (Signed)
Anesthesia Post Note  Patient: Donna Riley  Procedure(s) Performed: Procedure(s) (LRB): COLONOSCOPY WITH PROPOFOL (N/A) POLYPECTOMY INTESTINAL  Patient location during evaluation: PACU Anesthesia Type: MAC Level of consciousness: awake and alert Pain management: pain level controlled Vital Signs Assessment: post-procedure vital signs reviewed and stable Respiratory status: spontaneous breathing and respiratory function stable Cardiovascular status: stable Anesthetic complications: no    Jaci Standard, III,  Abayomi Pattison D

## 2015-08-28 NOTE — Op Note (Signed)
[facility] Gastroenterology Patient Name: Donna Riley Procedure Date: 08/28/2015 8:17 AM MRN: [id number] Account #: [id number] Date of Birth: 12-23-1960 Admit Type: Outpatient Age: 55 Room: [facility] OR ROOM 01 Gender: Female Note Status: Finalized Procedure:            Colonoscopy Indications:          Screening for colorectal malignant neoplasm Providers:            Lucilla Lame, MD Referring MD:         Halina Maidens, MD (Referring MD) Medicines:            Propofol per Anesthesia Complications:        No immediate complications. Procedure:            Pre-Anesthesia Assessment:                       - Prior to the procedure, a History and Physical was                        performed, and patient medications and allergies were                        reviewed. The patient's tolerance of previous                        anesthesia was also reviewed. The risks and benefits of                        the procedure and the sedation options and risks were                        discussed with the patient. All questions were                        answered, and informed consent was obtained. Prior                        Anticoagulants: The patient has taken no previous                        anticoagulant or antiplatelet agents. ASA Grade                        Assessment: II - A patient with mild systemic disease.                        After reviewing the risks and benefits, the patient was                        deemed in satisfactory condition to undergo the                        procedure.                       After obtaining informed consent, the colonoscope was                        passed under direct vision. Throughout the procedure,  the patient's blood pressure, pulse, and oxygen                        saturations were monitored continuously. The was                        introduced through the anus and advanced to the the              cecum, identified by appendiceal orifice and ileocecal                        valve. The colonoscopy was performed without                        difficulty. The patient tolerated the procedure well.                        The quality of the bowel preparation was excellent. Findings:      The perianal and digital rectal examinations were normal.      A 4 mm polyp was found in the rectum. The polyp was sessile. The polyp       was removed with a cold biopsy forceps. Resection and retrieval were       complete.      Non-bleeding internal hemorrhoids were found during retroflexion. The       hemorrhoids were Grade II (internal hemorrhoids that prolapse but reduce       spontaneously). Impression:           - One 4 mm polyp in the rectum, removed with a cold                        biopsy forceps. Resected and retrieved.                       - Non-bleeding internal hemorrhoids. Recommendation:       - Await pathology results.                       - Repeat colonoscopy in 5 years if polyp adenoma and 10                        years if hyperplastic Procedure Code(s):    --- Professional ---                       [phone], Colonoscopy, flexible; with biopsy, single or                        multiple Diagnosis Code(s):    --- Professional ---                       Z12.11, Encounter for screening for malignant neoplasm                        of colon                       K62.1, Rectal polyp CPT copyright 2016 [employer]. All rights reserved. The codes documented in this report are preliminary and upon coder review may  be revised to meet current compliance requirements. Lucilla Lame, MD  08/28/2015 8:34:43 AM This report has been signed electronically. Number of Addenda: 0 Note Initiated On: 08/28/2015 8:17 AM Scope Withdrawal Time: 0 hours 7 minutes 11 seconds  Total Procedure Duration: 0 hours 9 minutes 35 seconds       [facility]

## 2015-08-31 ENCOUNTER — Encounter: Payer: Self-pay | Admitting: Gastroenterology

## 2015-09-01 ENCOUNTER — Encounter: Payer: Self-pay | Admitting: Gastroenterology

## 2015-09-03 ENCOUNTER — Other Ambulatory Visit: Payer: Self-pay | Admitting: *Deleted

## 2015-09-03 MED ORDER — OXYCODONE-ACETAMINOPHEN 5-325 MG PO TABS: 1.0000 | ORAL_TABLET | Freq: Four times a day (QID) | ORAL | Status: DC | PRN

## 2015-09-03 NOTE — Telephone Encounter (Signed)
Discussed with Dr Mike Gip, agrees to refill one last time, but pt will have to see PCP regarding the pain and any further refills. I discussed this with pt and she stated she will make an appt with Dr Army Melia

## 2015-09-03 NOTE — Telephone Encounter (Signed)
Called and stated that her shoulder is still hurting and that she needs a refill on her pain med

## 2015-09-16 ENCOUNTER — Encounter: Payer: Self-pay | Admitting: Radiation Oncology

## 2015-09-16 ENCOUNTER — Ambulatory Visit
Admission: RE | Admit: 2015-09-16 | Discharge: 2015-09-16 | Disposition: A | Discharge status: Home / Self Care | Payer: Commercial Managed Care - HMO | Source: Ambulatory Visit | Attending: Radiation Oncology | Admitting: Radiation Oncology

## 2015-09-16 VITALS — BP 178/92 | HR 71 | Temp 97.8°F | Resp 18 | Wt 204.9 lb

## 2015-09-16 DIAGNOSIS — C50212 Malignant neoplasm of upper-inner quadrant of left female breast: Secondary | ICD-10-CM | POA: Diagnosis not present

## 2015-09-16 DIAGNOSIS — Z170 Estrogen receptor positive status [ER+]: Secondary | ICD-10-CM | POA: Diagnosis not present

## 2015-09-16 DIAGNOSIS — C50912 Malignant neoplasm of unspecified site of left female breast: Secondary | ICD-10-CM

## 2015-09-16 NOTE — Progress Notes (Signed)
Radiation Oncology Follow up Note  Name: Donna Riley   Date:   09/16/2015 MRN:  [id number] DOB: [birth year]    This 55 y.o. female presents to the clinic today for follow-up from stage IIa breast cancer status post wide local excision and sentinel node biopsy and adjuvant whole breast radiation to her left breast.  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 Tumor was ER/PR positive HER-2/neu negative. She is seen today in routine follow-up is doing fairly well she still has a slight nipple discharge as well as tenderness in the breast and left axilla. She's been followed by surgeon and if these problems persist biopsy may be indicated. She recently had a BI-RADS 2 mammogram which is fine she is currently on aromatase without side effect.. Again she has tenderness in the breast and left axilla no cough or bone pain.  COMPLICATIONS OF TREATMENT: none  FOLLOW UP COMPLIANCE: keeps appointments   PHYSICAL EXAM:  BP 178/92 mmHg  Pulse 71  Temp(Src) 97.8 F (36.6 C)  Resp 18  Wt 204 lb 14.7 oz (92.95 kg) Well-developed female in NAD. There is some retraction of the breast towards the scar and still some hyperpigmentation of the skin. She is tender in the left axilla. No dominant mass or nodularity is noted in either breast in 2 positions examined. No axillary or supraclavicular adenopathy is appreciated. Well-developed well-nourished patient in NAD. HEENT reveals PERLA, EOMI, discs not visualized.  Oral cavity is clear. No oral mucosal lesions are identified. Neck is clear without evidence of cervical or supraclavicular adenopathy. Lungs are clear to A&P. Cardiac examination is essentially unremarkable with regular rate and rhythm without murmur rub or thrill. Abdomen is  benign with no organomegaly or masses noted. Motor sensory and DTR levels are equal and symmetric in the upper and lower extremities. Cranial nerves II through XII are grossly intact. Proprioception is intact. No peripheral adenopathy or edema is identified. No motor or sensory levels are noted. Crude visual fields are within normal range.  RADIOLOGY RESULTS: Recent mammogram is reviewed compatible with the above-stated findings  PLAN: Not sure the etiology the discharge and tenderness although may be related to fat necrosis which we sometimes see. I've encouraged her mammogram is normal. She'll have a repeat in 6 months and is being followed by surgeon with possible biopsy should these problems persist. She continues on aromatase without side effect. I've asked to see her back in 6 months for follow-up. She knows to call sooner with any concerns.  I would like to take this opportunity for allowing me to participate in the care of your patient.Armstead Peaks., MD

## 2015-10-16 ENCOUNTER — Ambulatory Visit
Admission: RE | Admit: 2015-10-16 | Discharge: 2015-10-16 | Disposition: A | Discharge status: Home / Self Care | Payer: Commercial Managed Care - HMO | Source: Ambulatory Visit | Attending: Internal Medicine | Admitting: Internal Medicine

## 2015-10-16 ENCOUNTER — Encounter: Payer: Self-pay | Admitting: Internal Medicine

## 2015-10-16 ENCOUNTER — Ambulatory Visit (INDEPENDENT_AMBULATORY_CARE_PROVIDER_SITE_OTHER): Payer: Commercial Managed Care - HMO | Admitting: Internal Medicine

## 2015-10-16 VITALS — BP 154/82 | HR 95 | Temp 98.2°F | Resp 16 | Ht 68.0 in | Wt 202.0 lb

## 2015-10-16 DIAGNOSIS — M25512 Pain in left shoulder: Secondary | ICD-10-CM

## 2015-10-16 DIAGNOSIS — J40 Bronchitis, not specified as acute or chronic: Secondary | ICD-10-CM | POA: Diagnosis not present

## 2015-10-16 DIAGNOSIS — M19012 Primary osteoarthritis, left shoulder: Secondary | ICD-10-CM | POA: Diagnosis not present

## 2015-10-16 MED ORDER — ALBUTEROL SULFATE HFA 108 (90 BASE) MCG/ACT IN AERS: 2.0000 | INHALATION_SPRAY | Freq: Four times a day (QID) | RESPIRATORY_TRACT | Status: DC | PRN

## 2015-10-16 MED ORDER — METHYLPREDNISOLONE 4 MG PO TBPK: ORAL_TABLET | ORAL | Status: DC

## 2015-10-16 MED ORDER — CEFDINIR 300 MG PO CAPS: 300.0000 mg | ORAL_CAPSULE | Freq: Two times a day (BID) | ORAL | Status: DC

## 2015-10-16 NOTE — Patient Instructions (Signed)

## 2015-10-16 NOTE — Progress Notes (Signed)
Date:  10/16/2015   Name:  Donna Riley   DOB:  [birth year]   MRN:  [id number]   Chief Complaint: URI and Breast Problem URI  This is a new problem. The current episode started in the past 7 days. The problem has been unchanged. There has been no fever. Associated symptoms include congestion, coughing, a sore throat, swollen glands and wheezing. Pertinent negatives include no chest pain or ear pain. She has tried decongestant for the symptoms. The treatment provided no relief.  Shoulder Pain  This is a chronic problem. Pertinent negatives include no fever.   She reports left shoulder pain for some time. It seems to have started since her left lumpectomy in March 2016. She's been receiving Percocet from her oncologist but was told that she will no longer be prescribing this medication. On questioning she has pain with abduction and lateral rotation and flexion in the anterior and superior left shoulder. She also has pain in her trapezius muscle and rhomboids on the left side. She has muscle relaxants which she has not been taking. She was previously prescribed tramadol which did not provide any relief. She has not had any known injury to her shoulder. No x-rays or other orthopedic evaluation.    Review of Systems  Constitutional: Positive for chills. Negative for fever and fatigue.  HENT: Positive for congestion, postnasal drip, sinus pressure and sore throat. Negative for ear pain, tinnitus and trouble swallowing.   Eyes: Negative for visual disturbance.  Respiratory: Positive for cough and wheezing. Negative for choking and chest tightness.   Cardiovascular: Negative for chest pain, palpitations and leg swelling.  Musculoskeletal: Positive for myalgias, arthralgias and neck stiffness.    Patient Active Problem List   Diagnosis Date Noted  . Special screening for malignant neoplasms, colon   . Rectal polyp   . Irritable bowel syndrome with both constipation and diarrhea 07/20/2015  .  Controlled type 2 diabetes mellitus without complication, with long-term current use of insulin ([place]) 06/22/2015  . Pre-ulcerative calluses 06/22/2015  . Neuropathy ([place]) 06/22/2015  . Hot flash, menopausal 04/09/2015  . 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 Essential (primary) hypertension 03/25/2015  . 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 Osteopenia 03/12/2015  . Hydradenitis 01/23/2015  . Boil of upper extremity 10/20/2014  . Closed fracture of greater tuberosity of humerus 01/13/2014  . 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 Authorizing Provider  amLODipine (NORVASC) 5 MG tablet  05/10/15  Yes Historical Provider, MD  anastrozole (ARIMIDEX) 1 MG tablet Take 1 mg by mouth daily.   Yes Historical Provider, MD  BAYER BREEZE 2 TEST DISK  10/17/14  Yes Historical Provider, MD  Cholecalciferol (VITAMIN D3) 1000 UNITS CAPS Take 1 capsule by mouth every morning. Reported on 08/28/2015   Yes Historical Provider, MD  clindamycin (CLEOCIN-T) 1 % lotion Apply topically 2 (two) times daily. 01/23/15  Yes Clayburn Pert, MD  gabapentin (NEURONTIN) 100 MG capsule TAKE 1 CAPSULE THREE TIMES DAILY 08/18/15  Yes Glean Hess, MD  lisinopril (PRINIVIL,ZESTRIL) 40 MG tablet Take 1 tablet (40 mg total) by mouth daily. 06/22/15  Yes Glean Hess, MD  metFORMIN (GLUCOPHAGE) 500 MG tablet Take 1 tablet (500 mg total) by mouth every morning. 08/04/15  Yes Glean Hess, MD  MICROLET LANCETS [place]  10/17/14  Yes Historical Provider, MD  oxyCODONE-acetaminophen (ROXICET) 5-325  MG tablet Take 1 tablet by mouth every 6 (six) hours as needed for severe pain. 09/03/15  Yes Lequita Asal, MD    No Known Allergies  Past Surgical History  Procedure Laterality Date  . Cholecystectomy    . Tubal ligation Bilateral   . Breast lumpectomy with needle localization and axillary sentinel lymph  node bx Left 08/27/14  . Breast biopsy Left   . Esophagogastroduodenoscopy  2014    gastritis  . Ventral hernia repair N/A 04/10/2015    Procedure: HERNIA REPAIR VENTRAL ADULT;  Surgeon: Leonie Green, MD;  Location: [facility] ORS;  Service: General;  Laterality: N/A;  . Colonoscopy with propofol N/A 08/28/2015    Procedure: COLONOSCOPY WITH PROPOFOL;  Surgeon: Lucilla Lame, MD;  Location: [place];  Service: Endoscopy;  Laterality: N/A;  Diabetic oral  . Polypectomy  08/28/2015    Procedure: POLYPECTOMY INTESTINAL;  Surgeon: Lucilla Lame, MD;  Location: [place];  Service: Endoscopy;;  Rectal polyp    Social History  Substance Use Topics  . Smoking status: Current Every Day Smoker -- 1.00 packs/day for 38 years    Types: Cigarettes  . Smokeless tobacco: Never Used  . Alcohol Use: 0.0 - 0.6 oz/week    0-1 Cans of beer per week     Comment: Occasionally     Medication list has been reviewed and updated.   Physical Exam  Constitutional: She is oriented to person, place, and time. She appears well-developed and well-nourished.  HENT:  Right Ear: External ear and ear canal normal. Tympanic membrane is not erythematous and not retracted.  Left Ear: External ear and ear canal normal. Tympanic membrane is not erythematous and not retracted.  Nose: Right sinus exhibits maxillary sinus tenderness and frontal sinus tenderness. Left sinus exhibits maxillary sinus tenderness and frontal sinus tenderness.  Mouth/Throat: Uvula is midline and mucous membranes are normal. No oral lesions. Posterior oropharyngeal erythema present. No oropharyngeal exudate.  Cardiovascular: Normal rate, regular rhythm and normal heart sounds.   Pulmonary/Chest: Effort normal. She has wheezes in the right upper field, the right middle field, the right lower field, the left upper field, the left middle field and the left lower field. She has rhonchi in the right upper field and the left upper field.  She has no rales.  Musculoskeletal:       Left shoulder: She exhibits decreased range of motion, tenderness and bony tenderness.  Spasm and pain of trapezius and rhomboid muscles on left. Pain with abduction left shoulder. Grip and pulses intact.  Lymphadenopathy:    She has no cervical adenopathy.  Neurological: She is alert and oriented to person, place, and time.  Nursing note and vitals reviewed.   BP 180/90 mmHg  Pulse 95  Temp(Src) 98.2 F (36.8 C) (Oral)  Resp 16  Ht 5\' 8"  (1.727 m)  Wt 202 lb (91.627 kg)  BMI 30.72 kg/m2  SpO2 95%  Assessment and Plan: 1. Shoulder pain, acute, left Begin Flexeril 10 mg qhs Prednisone taper Consider Ortho referral for suspected shoulder tendonitis/rotator cuff disease - DG Shoulder Left; Future  2. Bronchitis - cefdinir (OMNICEF) 300 MG capsule; Take 1 capsule (300 mg total) by mouth 2 (two) times daily.  Dispense: 20 capsule; Refill: 0 - methylPREDNISolone (MEDROL DOSEPAK) 4 MG TBPK tablet; Take 6 pills on day 1 the 5 pills day 2 then 4 pills day 3 then 3 pills day 4 then 2 pills day 5 then one pills day 6 then stop  Dispense: 21 tablet; Refill: 0 - albuterol (PROVENTIL HFA;VENTOLIN HFA) 108 (90 Base) MCG/ACT inhaler; Inhale 2 puffs into the lungs every 6 (six) hours as needed for wheezing or shortness of breath.  Dispense: 1 Inhaler; Refill: 0   Halina Maidens, MD [facility]  10/16/2015

## 2015-11-05 ENCOUNTER — Inpatient Hospital Stay: Payer: Commercial Managed Care - HMO

## 2015-11-05 ENCOUNTER — Inpatient Hospital Stay: Payer: Commercial Managed Care - HMO | Admitting: Hematology and Oncology

## 2015-11-23 ENCOUNTER — Inpatient Hospital Stay: Payer: Commercial Managed Care - HMO | Attending: Hematology and Oncology

## 2015-11-23 ENCOUNTER — Encounter: Payer: Self-pay | Admitting: Hematology and Oncology

## 2015-11-23 ENCOUNTER — Inpatient Hospital Stay (HOSPITAL_BASED_OUTPATIENT_CLINIC_OR_DEPARTMENT_OTHER): Payer: Commercial Managed Care - HMO | Admitting: Hematology and Oncology

## 2015-11-23 VITALS — BP 171/91 | HR 63 | Temp 96.7°F | Resp 16 | Wt 207.7 lb

## 2015-11-23 DIAGNOSIS — Z923 Personal history of irradiation: Secondary | ICD-10-CM

## 2015-11-23 DIAGNOSIS — E119 Type 2 diabetes mellitus without complications: Secondary | ICD-10-CM | POA: Insufficient documentation

## 2015-11-23 DIAGNOSIS — C50912 Malignant neoplasm of unspecified site of left female breast: Secondary | ICD-10-CM | POA: Diagnosis not present

## 2015-11-23 DIAGNOSIS — M1990 Unspecified osteoarthritis, unspecified site: Secondary | ICD-10-CM | POA: Insufficient documentation

## 2015-11-23 DIAGNOSIS — K621 Rectal polyp: Secondary | ICD-10-CM | POA: Insufficient documentation

## 2015-11-23 DIAGNOSIS — Z7984 Long term (current) use of oral hypoglycemic drugs: Secondary | ICD-10-CM | POA: Insufficient documentation

## 2015-11-23 DIAGNOSIS — I10 Essential (primary) hypertension: Secondary | ICD-10-CM | POA: Diagnosis not present

## 2015-11-23 DIAGNOSIS — Z9012 Acquired absence of left breast and nipple: Secondary | ICD-10-CM

## 2015-11-23 DIAGNOSIS — Z170 Estrogen receptor positive status [ER+]: Secondary | ICD-10-CM | POA: Insufficient documentation

## 2015-11-23 DIAGNOSIS — R7611 Nonspecific reaction to tuberculin skin test without active tuberculosis: Secondary | ICD-10-CM | POA: Diagnosis not present

## 2015-11-23 DIAGNOSIS — Z79899 Other long term (current) drug therapy: Secondary | ICD-10-CM | POA: Diagnosis not present

## 2015-11-23 DIAGNOSIS — M8580 Other specified disorders of bone density and structure, unspecified site: Secondary | ICD-10-CM

## 2015-11-23 DIAGNOSIS — K648 Other hemorrhoids: Secondary | ICD-10-CM | POA: Insufficient documentation

## 2015-11-23 LAB — CBC WITH DIFFERENTIAL/PLATELET
Basophils Absolute: 0.1 10*3/uL (ref 0–0.1)
Basophils Relative: 1 %
Eosinophils Absolute: 0.1 10*3/uL (ref 0–0.7)
Eosinophils Relative: 2 %
HCT: 39.7 % (ref 35.0–47.0)
Hemoglobin: 13.1 g/dL (ref 12.0–16.0)
Lymphocytes Relative: 35 %
Lymphs Abs: 2.1 10*3/uL (ref 1.0–3.6)
MCH: 27 pg (ref 26.0–34.0)
MCHC: 32.9 g/dL (ref 32.0–36.0)
MCV: 82 fL (ref 80.0–100.0)
Monocytes Absolute: 0.3 10*3/uL (ref 0.2–0.9)
Monocytes Relative: 6 %
Neutro Abs: 3.5 10*3/uL (ref 1.4–6.5)
Neutrophils Relative %: 58 %
Platelets: 204 10*3/uL (ref 150–440)
RBC: 4.85 MIL/uL (ref 3.80–5.20)
RDW: 14.6 % — ABNORMAL HIGH (ref 11.5–14.5)
WBC: 6.1 10*3/uL (ref 3.6–11.0)

## 2015-11-23 LAB — COMPREHENSIVE METABOLIC PANEL
ALT: 22 U/L (ref 14–54)
AST: 21 U/L (ref 15–41)
Albumin: 4.1 g/dL (ref 3.5–5.0)
Alkaline Phosphatase: 90 U/L (ref 38–126)
Anion gap: 8 (ref 5–15)
BUN: 10 mg/dL (ref 6–20)
CO2: 26 mmol/L (ref 22–32)
Calcium: 9.2 mg/dL (ref 8.9–10.3)
Chloride: 106 mmol/L (ref 101–111)
Creatinine, Ser: 0.79 mg/dL (ref 0.44–1.00)
GFR calc Af Amer: >60 mL/min (ref >60)
GFR calc non Af Amer: >60 mL/min (ref >60)
Glucose, Bld: 101 mg/dL — ABNORMAL HIGH (ref 65–99)
Potassium: 4 mmol/L (ref 3.5–5.1)
Sodium: 140 mmol/L (ref 135–145)
Total Bilirubin: 0.3 mg/dL (ref 0.3–1.2)
Total Protein: 7.3 g/dL (ref 6.5–8.1)

## 2015-11-23 NOTE — Progress Notes (Signed)
[facility] day:  11/23/2015   Chief Complaint: Donna Riley is a 55 y.o. 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  HPI: The patient was last seen in the medical oncology clinic on 08/07/2015.  At that time, she had mild achiness and vasomotor symptoms due to Arimidex (tolerable).  She had chest wall tightness due to radiation.  She noted bloody nipple discharge.  Exam revealed left breast post radiation changes.  She had a left axillary cyst secondary to hidradenitis suppurativa.  Labs included a normal CBC with diff, CMP, and CA27.29 (15.6).  She was referred to Dr. Azalee Course for bloody nipple discharge.  She was seen on 08/12/2015.  Decision was made for observation.  She has a follow-up appointment in 11/2015.  She states that her left nipple still leaks.  Colonoscopy on 08/28/2015 by Dr. Lucilla Lame revealed one 4 mm polyp in the rectum and non bleeding internal hemorrhoids.  Pathology revealed a tubular adenoma.  She will have a repeat colonoscopy in 5 years.   Past Medical History  Diagnosis Date  . Elevated LFTs   . Positive PPD, treated   . Hypertension   . Breast cancer ([place]) 2016    left- chemo/radiation  . Diabetes mellitus without complication ([place]) 09/3974  . Shortness of breath dyspnea     with exertion  . Arthritis     shoulders and neck    Past Surgical History  Procedure Laterality Date  . Cholecystectomy    . Tubal ligation Bilateral   . Breast lumpectomy with needle localization and axillary sentinel lymph node bx Left 08/27/14  . Breast biopsy Left   . Esophagogastroduodenoscopy  2014    gastritis  . Ventral hernia repair N/A 04/10/2015    Procedure: HERNIA REPAIR VENTRAL ADULT;  Surgeon: Leonie Green, MD;  Location: [facility];  Service: General;  Laterality: N/A;  . Colonoscopy with propofol N/A 08/28/2015    Procedure: COLONOSCOPY WITH  PROPOFOL;  Surgeon: Lucilla Lame, MD;  Location: [place];  Service: Endoscopy;  Laterality: N/A;  Diabetic oral  . Polypectomy  08/28/2015    Procedure: POLYPECTOMY INTESTINAL;  Surgeon: Lucilla Lame, MD;  Location: [place];  Service: Endoscopy;;  Rectal polyp    Family History  Problem Relation Age of Onset  . Cancer Mother     Pancreatic  . Cancer Father     Throat  . Cancer Maternal Grandmother     Breast  . Breast cancer Maternal Grandmother 75    Social History:  reports that she has been smoking Cigarettes.  She has a 38 pack-year smoking history. She has never used smokeless tobacco. She reports that she drinks alcohol. She reports that she does not use illicit drugs.  The patient is alone today.  Allergies: No Known Allergies  Current Medications: Current Outpatient Prescriptions  Medication Sig Dispense Refill  . albuterol (PROVENTIL HFA;VENTOLIN HFA) 108 (90 Base) MCG/ACT inhaler Inhale 2 puffs into the lungs every 6 (six) hours as needed for wheezing or shortness of breath. 1 Inhaler 0  . amLODipine (NORVASC) 5 MG tablet     . anastrozole (ARIMIDEX) 1 MG tablet Take 1 mg by mouth daily.    Marland Kitchen BAYER BREEZE 2 TEST DISK     . Cholecalciferol (VITAMIN D3) 1000 UNITS CAPS Take 1 capsule by mouth every morning. Reported on 08/28/2015    .  clindamycin (CLEOCIN-T) 1 % lotion Apply topically 2 (two) times daily. 60 mL 0  . gabapentin (NEURONTIN) 100 MG capsule TAKE 1 CAPSULE THREE TIMES DAILY 270 capsule 0  . lisinopril (PRINIVIL,ZESTRIL) 40 MG tablet Take 1 tablet (40 mg total) by mouth daily. 90 tablet 3  . metFORMIN (GLUCOPHAGE) 500 MG tablet Take 1 tablet (500 mg total) by mouth every morning. 90 tablet 1  . MICROLET LANCETS MISC     . oxyCODONE-acetaminophen (ROXICET) 5-325 MG tablet Take 1 tablet by mouth every 6 (six) hours as needed for severe pain. 30 tablet 0   No current facility-administered medications for this visit.    Review of Systems:   GENERAL: Feels "ok". No fevers or sweats.  Weight up 1 pound. PERFORMANCE STATUS (ECOG): 1 HEENT: No visual changes, runny nose, sore throat, mouth sores or tenderness. Lungs: URI.  No shortness of breath or cough. No hemoptysis. Cardiac: No chest pain, palpitations, orthopnea, or PND. Breast: Left breast darker post radiation.  Left nipple discharge (bloody).  No skin changes. GI: 3 loose stools/day.  No nausea, vomiting, constipation, melena or hematochezia.  Colonoscsopy 08/28/2015. GU: No urgency, frequency, dysuria, or hematuria. Musculoskeletal: Tight across chest post surgery and radiation.  No back pain. No joint pain. No muscle tenderness. Extremities: No pain or swelling. Skin: No skin changes, rashes or ulcers. Neuro:No headache, numbness or weakness, balance or coordination issues. Endocrine: Hot flashes.  No diabetes, thyroid issues or night sweats. Psych: Trouble sleeping at night.  No mood changes, depression or anxiety. Pain: No focal pain. Review of systems: All other systems reviewed and found to be negative.  Physical Exam: Blood pressure 171/91, pulse 63, temperature 96.7 F (35.9 C), temperature source Tympanic, resp. rate 16, weight 207 lb 10.8 oz (94.2 kg). GENERAL: Well developed, well nourished, woman sitting comfortably in the exam room in no acute distress. MENTAL STATUS: Alert and oriented to person, place and time. HEAD: Long dark brown hair. Normocephalic, atraumatic, face symmetric, no Cushingoid features. EYES: Brown eyes. Pupils equal round and reactive to light and accomodation. No conjunctivitis or scleral icterus. ENT: Oropharynx clear without lesion. Tongue normal. Mucous membranes moist.  RESPIRATORY: Clear to auscultation without rales, wheezes or rhonchi. CARDIOVASCULAR: Regular rate and rhythm without murmur, rub or gallop. BREASTS:  Right breast without masses, skin changes or nipple discharge.  Left breast with  moderate hyperpigmentation.  Post surgical and post radiation changes with edema (pronounced lower inner quadrant).  Tender.  No nipple discharge. ABDOMEN: Ventral hernia.  Soft, non-tender, with active bowel sounds, and no hepatosplenomegaly. No masses. SKIN:  No rashes, ulcers or lesions. EXTREMITIES: No edema, no skin discoloration or tenderness. No palpable cords. LYMPH NODES: No palpable cervical, supraclavicular, axillary or inguinal adenopathy  NEUROLOGICAL: Unremarkable.  PSYCH: Appropriate.  Appointment on 11/23/2015  Component Date Value Ref Range Status  . WBC 11/23/2015 6.1  3.6 - 11.0 K/uL Final  . RBC 11/23/2015 4.85  3.80 - 5.20 MIL/uL Final  . Hemoglobin 11/23/2015 13.1  12.0 - 16.0 g/dL Final  . HCT 11/23/2015 39.7  35.0 - 47.0 % Final  . MCV 11/23/2015 82.0  80.0 - 100.0 fL Final  . MCH 11/23/2015 27.0  26.0 - 34.0 pg Final  . MCHC 11/23/2015 32.9  32.0 - 36.0 g/dL Final  . RDW 11/23/2015 14.6* 11.5 - 14.5 % Final  . Platelets 11/23/2015 204  150 - 440 K/uL Final  . Neutrophils Relative % 11/23/2015 58   Final  . Neutro Abs  11/23/2015 3.5  1.4 - 6.5 K/uL Final  . Lymphocytes Relative 11/23/2015 35   Final  . Lymphs Abs 11/23/2015 2.1  1.0 - 3.6 K/uL Final  . Monocytes Relative 11/23/2015 6   Final  . Monocytes Absolute 11/23/2015 0.3  0.2 - 0.9 K/uL Final  . Eosinophils Relative 11/23/2015 2   Final  . Eosinophils Absolute 11/23/2015 0.1  0 - 0.7 K/uL Final  . Basophils Relative 11/23/2015 1   Final  . Basophils Absolute 11/23/2015 0.1  0 - 0.1 K/uL Final  . Sodium 11/23/2015 140  135 - 145 mmol/L Final  . Potassium 11/23/2015 4.0  3.5 - 5.1 mmol/L Final  . Chloride 11/23/2015 106  101 - 111 mmol/L Final  . CO2 11/23/2015 26  22 - 32 mmol/L Final  . Glucose, Bld 11/23/2015 101* 65 - 99 mg/dL Final  . BUN 11/23/2015 10  6 - 20 mg/dL Final  . Creatinine, Ser 11/23/2015 0.79  0.44 - 1.00 mg/dL Final  . Calcium 11/23/2015 9.2  8.9 - 10.3 mg/dL Final  . Total  Protein 11/23/2015 7.3  6.5 - 8.1 g/dL Final  . Albumin 11/23/2015 4.1  3.5 - 5.0 g/dL Final  . AST 11/23/2015 21  15 - 41 U/L Final  . ALT 11/23/2015 22  14 - 54 U/L Final  . Alkaline Phosphatase 11/23/2015 90  38 - 126 U/L Final  . Total Bilirubin 11/23/2015 0.3  0.3 - 1.2 mg/dL Final  . GFR calc non Af Amer 11/23/2015 >60  >60 mL/min Final  . GFR calc Af Amer 11/23/2015 >60  >60 mL/min Final   Comment: (NOTE) The eGFR has been calculated using the CKD EPI equation. This calculation has not been validated in all clinical situations. eGFR's persistently <60 mL/min signify possible Chronic Kidney Disease.   . Anion gap 11/23/2015 8  5 - 15 Final    Assessment:  KILYN MARAGH is a 55 y.o. African American female with stage IIA left breast cancer status post partial mastectomy and sentinel lymph node biopsy on 08/27/2014. 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 There was lymphovascular invasion. One of 2 sentinel lymph nodes were positive (focus of 2.8 mm). Tumor was > 90% ER positive, > 90% PR positive, and Her2/neu negative. Pathologic stage was [id number].  Bone scan on 09/16/2014 revealed abnormal focal uptake at the level of right L3 pedicle and L5 vertebral body. Lumbar spine MRI on 09/27/2014 revealed no evidence of metastatic disease with lower lumbar facet arthritis. Abdominal and pelvic CT scan on 09/24/2014 revealed hepatomegaly and no evidence of metastatic disease.   Bone density study on 09/15/2014 revealed a T-score of -2.1 at L1-L4 (osteopenia).  She is on calcium and vitamin D.  She received 4 cycles of Taxotere and Cytoxan (09/29/2014 - 12/09/2014) with Neulasta support. She received 50.4 Gy to the left breast from 01/12/2015 until 02/23/2015.  She was started on Femara on 03/12/2015, but switched to Arimidex on 03/30/2015 secondary to diffuse joint aches.    Bilateral diagnostic mammogram on  07/30/2015 revealed no evidence of malignancy.  Colonoscopy on 08/28/2015 revealed one 4 mm polyp in the rectum (tubular adenoma).  Symptomatically, she has mild achiness and vasomotor symptoms due to Arimidex.  She continues to have left nipple drainage.  Exam reveals left breast post radiation changes.    Plan: 1.  Labs today: CBC with diff, CMP, CA27.29. 2.  Review Dr. Geoffry Paradise note.  3.  Continue Arimidex.   4.  RTC in 3 months for MD assessment and labs (CBC with diff, CMP, CA27.9).   Lequita Asal, MD  11/23/2015, 3:45 PM

## 2015-11-23 NOTE — Progress Notes (Signed)
Pt reports fatigue and Diarrhea. Pt reports that she has at least 3 loose stools a day. Pt on antibiotic recently for URI.  Left breast still leaks.

## 2015-11-24 LAB — CANCER ANTIGEN 27.29: CA 27.29: 17.4 U/mL (ref 0.0–38.6)

## 2016-01-07 ENCOUNTER — Ambulatory Visit: Payer: Commercial Managed Care - HMO | Admitting: Internal Medicine

## 2016-02-10 ENCOUNTER — Other Ambulatory Visit: Payer: Self-pay | Admitting: Surgery

## 2016-02-10 DIAGNOSIS — K922 Gastrointestinal hemorrhage, unspecified: Secondary | ICD-10-CM | POA: Diagnosis not present

## 2016-02-10 DIAGNOSIS — R0789 Other chest pain: Secondary | ICD-10-CM | POA: Diagnosis not present

## 2016-02-10 DIAGNOSIS — R1013 Epigastric pain: Secondary | ICD-10-CM | POA: Diagnosis not present

## 2016-02-10 DIAGNOSIS — I10 Essential (primary) hypertension: Secondary | ICD-10-CM | POA: Diagnosis not present

## 2016-02-10 DIAGNOSIS — R109 Unspecified abdominal pain: Secondary | ICD-10-CM

## 2016-02-12 ENCOUNTER — Ambulatory Visit (INDEPENDENT_AMBULATORY_CARE_PROVIDER_SITE_OTHER): Payer: Commercial Managed Care - HMO | Admitting: Internal Medicine

## 2016-02-12 ENCOUNTER — Encounter: Payer: Self-pay | Admitting: Internal Medicine

## 2016-02-12 VITALS — BP 170/92 | HR 81 | Resp 16 | Ht 68.0 in | Wt 204.0 lb

## 2016-02-12 DIAGNOSIS — I10 Essential (primary) hypertension: Secondary | ICD-10-CM

## 2016-02-12 DIAGNOSIS — E119 Type 2 diabetes mellitus without complications: Secondary | ICD-10-CM

## 2016-02-12 DIAGNOSIS — R079 Chest pain, unspecified: Secondary | ICD-10-CM | POA: Diagnosis not present

## 2016-02-12 MED ORDER — AMLODIPINE BESYLATE 5 MG PO TABS: 5.0000 mg | ORAL_TABLET | Freq: Every day | ORAL | 5 refills | Status: DC

## 2016-02-12 NOTE — Patient Instructions (Signed)
Aspirin 81 mg per day  Resume Amlodipine 5 mg daily  Continue Lisinopril 40 mg

## 2016-02-12 NOTE — Progress Notes (Signed)
Date:  02/12/2016   Name:  Donna Riley   DOB:  January 27, 1961   MRN:  [id number]   Chief Complaint: Hypertension; Chest Pain (tightness); Headache; and Foot Swelling Hypertension  This is a chronic problem. The problem has been gradually worsening since onset. The problem is uncontrolled. Associated symptoms include headaches and shortness of breath. Pertinent negatives include no chest pain or palpitations. Past treatments include ACE inhibitors and calcium channel blockers (but stopped amlodipine months ago for unclear reasons).  Chest Pain   Associated symptoms include abdominal pain, headaches and shortness of breath. Pertinent negatives include no cough, dizziness, fever, numbness or palpitations.  Her past medical history is significant for hypertension.  Headache   Associated symptoms include abdominal pain. Pertinent negatives include no coughing, dizziness, fever or numbness. Her past medical history is significant for hypertension.   She does not check her BP so has no idea how long it has been elevated. She continues smoke and is diabetic.  She has never been seen by Cardiology.  Her last EKG showed LVH.  Review of Systems  Constitutional: Negative for chills, fatigue, fever and unexpected weight change.  Eyes: Negative for visual disturbance.  Respiratory: Positive for chest tightness and shortness of breath. Negative for cough, choking and wheezing.   Cardiovascular: Negative for chest pain, palpitations and leg swelling.  Gastrointestinal: Positive for abdominal pain. Negative for blood in stool and constipation.  Musculoskeletal: Positive for joint swelling (left ankle). Negative for arthralgias.  Neurological: Positive for headaches. Negative for dizziness, syncope and numbness.  Hematological: Negative for adenopathy. Does not bruise/bleed easily.  Psychiatric/Behavioral: Negative for sleep disturbance.    Patient Active Problem List   Diagnosis Date Noted  . Special  screening for malignant neoplasms, colon   . Rectal polyp   . Irritable bowel syndrome with both constipation and diarrhea 07/20/2015  . Controlled type 2 diabetes mellitus without complication, without long-term current use of insulin ([place]) 06/22/2015  . Pre-ulcerative calluses 06/22/2015  . Neuropathy ([place]) 06/22/2015  . Hot flash, menopausal 04/09/2015  . Infiltrating ductal carcinoma of left female breast ([place]) 03/25/2015  . Essential (primary) hypertension 03/25/2015  . 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 Osteopenia 03/12/2015  . Hydradenitis 01/23/2015  . Boil of upper extremity 10/20/2014  . Closed fracture of greater tuberosity of humerus 01/13/2014  . 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 Authorizing Provider  albuterol (PROVENTIL HFA;VENTOLIN HFA) 108 (90 Base) MCG/ACT inhaler Inhale 2 puffs into the lungs every 6 (six) hours as needed for wheezing or shortness of breath. 10/16/15   Glean Hess, MD  amLODipine (NORVASC) 5 MG tablet  05/10/15   Historical Provider, MD  anastrozole (ARIMIDEX) 1 MG tablet Take 1 mg by mouth daily.    Historical Provider, MD  BAYER BREEZE 2 TEST DISK  10/17/14   Historical Provider, MD  Cholecalciferol (VITAMIN D3) 1000 UNITS CAPS Take 1 capsule by mouth every morning. Reported on 08/28/2015    Historical Provider, MD  clindamycin (CLEOCIN-T) 1 % lotion Apply topically 2 (two) times daily. 01/23/15   Clayburn Pert, MD  gabapentin (NEURONTIN) 100 MG capsule TAKE 1 CAPSULE THREE TIMES DAILY 08/18/15   Glean Hess, MD  lisinopril (PRINIVIL,ZESTRIL) 40 MG tablet Take 1 tablet (40 mg total) by mouth daily. 06/22/15   Glean Hess, MD  metFORMIN (GLUCOPHAGE) 500 MG tablet Take 1 tablet (500 mg  total) by mouth every morning. 08/04/15   Glean Hess, MD  Mark  10/17/14   Historical Provider, MD    No Known Allergies  Past Surgical  History:  Procedure Laterality Date  . BREAST BIOPSY Left   . BREAST LUMPECTOMY WITH NEEDLE LOCALIZATION AND AXILLARY SENTINEL LYMPH NODE BX Left 08/27/14  . CHOLECYSTECTOMY    . COLONOSCOPY WITH PROPOFOL N/A 08/28/2015   Procedure: COLONOSCOPY WITH PROPOFOL;  Surgeon: Lucilla Lame, MD;  Location: [place];  Service: Endoscopy;  Laterality: N/A;  Diabetic oral  . ESOPHAGOGASTRODUODENOSCOPY  2014   gastritis  . POLYPECTOMY  08/28/2015   Procedure: POLYPECTOMY INTESTINAL;  Surgeon: Lucilla Lame, MD;  Location: [place];  Service: Endoscopy;;  Rectal polyp  . TUBAL LIGATION Bilateral   . VENTRAL HERNIA REPAIR N/A 04/10/2015   Procedure: HERNIA REPAIR VENTRAL ADULT;  Surgeon: Leonie Green, MD;  Location: [facility];  Service: General;  Laterality: N/A;    Social History  Substance Use Topics  . Smoking status: Current Every Day Smoker    Packs/day: 1.00    Years: 38.00    Types: Cigarettes  . Smokeless tobacco: Never Used  . Alcohol use 0.0 - 0.6 oz/week     Comment: Occasionally      Medication list has been reviewed and updated.   Physical Exam  Constitutional: She is oriented to person, place, and time. She appears well-developed. No distress.  HENT:  Head: Normocephalic and atraumatic.  Neck: Normal range of motion. Neck supple. Carotid bruit is not present. No thyromegaly present.  Cardiovascular: Normal rate, regular rhythm and normal heart sounds.   Pulmonary/Chest: Effort normal and breath sounds normal. No respiratory distress. She exhibits no tenderness and no deformity.  Musculoskeletal: She exhibits no edema.  Neurological: She is alert and oriented to person, place, and time.  Skin: Skin is warm and dry. No rash noted.  Psychiatric: She has a normal mood and affect. Her speech is normal and behavior is normal. Thought content normal.  Nursing note and vitals reviewed.   BP (!) 170/92   Pulse 81   Resp 16   Ht 5\' 8"  (1.727 m)   Wt 204 lb  (92.5 kg)   SpO2 99%   BMI 31.02 kg/m   Assessment and Plan: 1. Essential (primary) hypertension Continue lisinopril Resume amlodipine - amLODipine (NORVASC) 5 MG tablet; Take 1 tablet (5 mg total) by mouth daily.  Dispense: 30 tablet; Refill: 5 - TSH  2. Controlled type 2 diabetes mellitus without complication, without long-term current use of insulin ([facility]) - Hemoglobin A1c  3. Chest pain at rest Begin aspirin 81 mg daily - EKG 12-Lead - SR @ 80 with LVH by voltage - Ambulatory referral to Cardiology   Halina Maidens, MD [facility]  02/12/2016

## 2016-02-13 LAB — TSH: TSH: 1.05 u[IU]/mL (ref 0.450–4.500)

## 2016-02-13 LAB — HEMOGLOBIN A1C
Est. average glucose Bld gHb Est-mCnc: 128 mg/dL
Hgb A1c MFr Bld: 6.1 % — ABNORMAL HIGH (ref 4.8–5.6)

## 2016-02-15 ENCOUNTER — Other Ambulatory Visit: Payer: Self-pay | Admitting: Surgery

## 2016-02-15 DIAGNOSIS — C50912 Malignant neoplasm of unspecified site of left female breast: Secondary | ICD-10-CM

## 2016-02-16 ENCOUNTER — Ambulatory Visit
Admission: RE | Admit: 2016-02-16 | Discharge: 2016-02-16 | Disposition: A | Discharge status: Home / Self Care | Payer: Commercial Managed Care - HMO | Source: Ambulatory Visit | Attending: Surgery | Admitting: Surgery

## 2016-02-16 ENCOUNTER — Telehealth: Payer: Self-pay

## 2016-02-16 DIAGNOSIS — R1033 Periumbilical pain: Secondary | ICD-10-CM | POA: Diagnosis not present

## 2016-02-16 DIAGNOSIS — R109 Unspecified abdominal pain: Secondary | ICD-10-CM

## 2016-02-16 DIAGNOSIS — R1013 Epigastric pain: Secondary | ICD-10-CM | POA: Insufficient documentation

## 2016-02-16 LAB — POCT I-STAT CREATININE: Creatinine, Ser: 0.7 mg/dL (ref 0.44–1.00)

## 2016-02-16 MED ORDER — IOPAMIDOL (ISOVUE-300) INJECTION 61%
100.0000 mL | Freq: Once | INTRAVENOUS | Status: AC | PRN
  Administered 2016-02-16: 100 mL via INTRAVENOUS

## 2016-02-16 NOTE — Telephone Encounter (Signed)
Mammogram has been scheduled for patient on 02/29/16. Patient will need follow-up with Dr. Azalee Course right afterwards.   Called patient to schedule this appointment. No answer. Left voicemail asking for return phone call.

## 2016-02-21 NOTE — Progress Notes (Signed)
[facility] day:  02/22/16   Chief Complaint: Donna Riley is a 55 y.o. 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  HPI: The patient was last seen in the medical oncology clinic on 11/23/2015.  At that time, she had mild achiness and vasomotor symptoms due to Arimidex.  She had ongoing left nipple drainage.  Exam revealed left breast post radiation changes. CA27.29 was 17.4 (normal).  She has an appointment for a mammogram on 02/29/2016 then follow-up with Dr. Azalee Course.  She sees Dr. Allen Norris next week.  Symptomatically, she feels tired at the end of the day.  She doesn't sleep well.     Past Medical History:  Diagnosis Date  . Arthritis    shoulders and neck  . Breast cancer ([place]) 2016    Left breast with chemo + rad tx's with lumpectomy.  . Diabetes mellitus without complication ([place]) 08/8248  . Elevated LFTs   . Hypertension   . Positive PPD, treated   . Shortness of breath dyspnea    with exertion    Past Surgical History:  Procedure Laterality Date  . BREAST BIOPSY Left   . BREAST LUMPECTOMY WITH NEEDLE LOCALIZATION AND AXILLARY SENTINEL LYMPH NODE BX Left 08/27/14  . CHOLECYSTECTOMY    . COLONOSCOPY WITH PROPOFOL N/A 08/28/2015   Procedure: COLONOSCOPY WITH PROPOFOL;  Surgeon: Lucilla Lame, MD;  Location: [facility];  Service: Endoscopy;  Laterality: N/A;  Diabetic oral  . ESOPHAGOGASTRODUODENOSCOPY  2014   gastritis  . POLYPECTOMY  08/28/2015   Procedure: POLYPECTOMY INTESTINAL;  Surgeon: Lucilla Lame, MD;  Location: [facility];  Service: Endoscopy;;  Rectal polyp  . TUBAL LIGATION Bilateral   . VENTRAL HERNIA REPAIR N/A 04/10/2015   Procedure: HERNIA REPAIR VENTRAL ADULT;  Surgeon: Leonie Green, MD;  Location: [facility];  Service: General;  Laterality: N/A;    Family History  Problem Relation Age of Onset  . Cancer Mother     Pancreatic   . Cancer Father     Throat  . Cancer Maternal Grandmother     Breast  . Breast cancer Maternal Grandmother 75    Social History:  reports that she has been smoking Cigarettes.  She has a 38.00 pack-year smoking history. She has never used smokeless tobacco. She reports that she drinks alcohol. She reports that she does not use drugs.  The patient is alone today.  Allergies: No Known Allergies  Current Medications: Current Outpatient Prescriptions  Medication Sig Dispense Refill  . amLODipine (NORVASC) 5 MG tablet Take 1 tablet (5 mg total) by mouth daily. 30 tablet 5  . anastrozole (ARIMIDEX) 1 MG tablet Take 1 mg by mouth daily.    Marland Kitchen aspirin EC 81 MG tablet Take 81 mg by mouth daily.    . Cholecalciferol (VITAMIN D3) 1000 UNITS CAPS Take 1 capsule by mouth every morning. Reported on 08/28/2015    . clindamycin (CLEOCIN-T) 1 % lotion Apply topically 2 (two) times daily. 60 mL 0  . lisinopril (PRINIVIL,ZESTRIL) 40 MG tablet Take 1 tablet (40 mg total) by mouth daily. 90 tablet 3  . metFORMIN (GLUCOPHAGE) 500 MG tablet Take 1 tablet (500 mg total) by mouth every morning. 90 tablet 1  . MICROLET LANCETS MISC      No current facility-administered medications for this visit.     Review of Systems:  GENERAL: Feels "ok". Tired at  end of day.  No fevers or sweats.  Weight up 2 pounds. PERFORMANCE STATUS (ECOG): 1 HEENT: No visual changes, runny nose, sore throat, mouth sores or tenderness. Lungs: No shortness of breath or cough. No hemoptysis. Cardiac: No chest pain, palpitations, orthopnea, or PND. Breast: Left breast darker post radiation.  Left nipple discharge, persistent.  No skin changes. GI: No nausea, vomiting, constipation, melena or hematochezia.  s/p colonoscsopy 08/28/2015. GU: No urgency, frequency, dysuria, or hematuria. Musculoskeletal: No back pain. No joint pain. No muscle tenderness. Extremities: No pain or swelling. Skin: No skin changes, rashes or  ulcers. Neuro:No headache, numbness or weakness, balance or coordination issues. Endocrine: Hot flashes.  No diabetes, thyroid issues or night sweats. Psych: Trouble sleeping at night.  No mood changes, depression or anxiety. Pain: No focal pain. Review of systems: All other systems reviewed and found to be negative.  Physical Exam: Blood pressure (!) 169/108, pulse 76, temperature 97.6 F (36.4 C), temperature source Tympanic, resp. rate 18, weight 205 lb 9.3 oz (93.2 kg). GENERAL: Well developed, well nourished, woman sitting comfortably in the exam room in no acute distress. MENTAL STATUS: Alert and oriented to person, place and time. HEAD: Brown wig. Normocephalic, atraumatic, face symmetric, no Cushingoid features. EYES: Brown eyes. Pupils equal round and reactive to light and accomodation. No conjunctivitis or scleral icterus. ENT: Oropharynx clear without lesion. Tongue normal. Mucous membranes moist.  RESPIRATORY: Clear to auscultation without rales, wheezes or rhonchi. CARDIOVASCULAR: Regular rate and rhythm without murmur, rub or gallop. BREASTS:  Right breast without masses, skin changes or nipple discharge.  Left breast with moderate hyperpigmentation.  Post surgical and post radiation changes with edema.  Tender.  Clear nipple discharge. ABDOMEN: Ventral hernia.  Soft, non-tender, with active bowel sounds, and no hepatosplenomegaly. No masses. SKIN:  No rashes, ulcers or lesions. EXTREMITIES: No edema, no skin discoloration or tenderness. No palpable cords. LYMPH NODES: No palpable cervical, supraclavicular, axillary or inguinal adenopathy  NEUROLOGICAL: Unremarkable.  PSYCH: Appropriate.   Appointment on 02/22/2016  Component Date Value Ref Range Status  . WBC 02/22/2016 6.4  3.6 - 11.0 K/uL Final  . RBC 02/22/2016 4.97  3.80 - 5.20 MIL/uL Final  . Hemoglobin 02/22/2016 13.5  12.0 - 16.0 g/dL Final  . HCT 02/22/2016 40.4  35.0 - 47.0 % Final  . MCV  02/22/2016 81.2  80.0 - 100.0 fL Final  . MCH 02/22/2016 27.1  26.0 - 34.0 pg Final  . MCHC 02/22/2016 33.3  32.0 - 36.0 g/dL Final  . RDW 02/22/2016 14.2  11.5 - 14.5 % Final  . Platelets 02/22/2016 209  150 - 440 K/uL Final  . Neutrophils Relative % 02/22/2016 57  % Final  . Neutro Abs 02/22/2016 3.7  1.4 - 6.5 K/uL Final  . Lymphocytes Relative 02/22/2016 34  % Final  . Lymphs Abs 02/22/2016 2.2  1.0 - 3.6 K/uL Final  . Monocytes Relative 02/22/2016 6  % Final  . Monocytes Absolute 02/22/2016 0.4  0.2 - 0.9 K/uL Final  . Eosinophils Relative 02/22/2016 2  % Final  . Eosinophils Absolute 02/22/2016 0.1  0 - 0.7 K/uL Final  . Basophils Relative 02/22/2016 1  % Final  . Basophils Absolute 02/22/2016 0.1  0 - 0.1 K/uL Final  . Sodium 02/22/2016 138  135 - 145 mmol/L Final  . Potassium 02/22/2016 3.9  3.5 - 5.1 mmol/L Final  . Chloride 02/22/2016 105  101 - 111 mmol/L Final  . CO2 02/22/2016 25  22 -  32 mmol/L Final  . Glucose, Bld 02/22/2016 106* 65 - 99 mg/dL Final  . BUN 02/22/2016 13  6 - 20 mg/dL Final  . Creatinine, Ser 02/22/2016 0.82  0.44 - 1.00 mg/dL Final  . Calcium 02/22/2016 9.7  8.9 - 10.3 mg/dL Final  . Total Protein 02/22/2016 7.7  6.5 - 8.1 g/dL Final  . Albumin 02/22/2016 4.3  3.5 - 5.0 g/dL Final  . AST 02/22/2016 18  15 - 41 U/L Final  . ALT 02/22/2016 19  14 - 54 U/L Final  . Alkaline Phosphatase 02/22/2016 89  38 - 126 U/L Final  . Total Bilirubin 02/22/2016 0.4  0.3 - 1.2 mg/dL Final  . GFR calc non Af Amer 02/22/2016 >60  >60 mL/min Final  . GFR calc Af Amer 02/22/2016 >60  >60 mL/min Final   Comment: (NOTE) The eGFR has been calculated using the CKD EPI equation. This calculation has not been validated in all clinical situations. eGFR's persistently <60 mL/min signify possible Chronic Kidney Disease.   . Anion gap 02/22/2016 8  5 - 15 Final    Assessment:  Donna Riley is a 55 y.o. African American female with stage IIA left breast cancer status post  partial mastectomy and sentinel lymph node biopsy on 08/27/2014. 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 There was lymphovascular invasion. One of 2 sentinel lymph nodes were positive (focus of 2.8 mm). Tumor was > 90% ER positive, > 90% PR positive, and Her2/neu negative. Pathologic stage was T1bN1aM0.  Bone scan on 09/16/2014 revealed abnormal focal uptake at the level of right L3 pedicle and L5 vertebral body. Lumbar spine MRI on 09/27/2014 revealed no evidence of metastatic disease with lower lumbar facet arthritis. Abdominal and pelvic CT scan on 09/24/2014 revealed hepatomegaly and no evidence of metastatic disease.   Bone density study on 09/15/2014 revealed osteopenia with a T-score of -2.1 at L1-L4.  She is on calcium and vitamin D.  She received 4 cycles of Taxotere and Cytoxan (09/29/2014 - 12/09/2014) with Neulasta support. She received 50.4 Gy to the left breast from 01/12/2015 until 02/23/2015.  She was started on Femara on 03/12/2015, but switched to Arimidex on 03/30/2015 secondary to diffuse joint aches.    CA27.29 was 16.9 on 09/09/2014, 12.1 on 06/02/2015, 15.6 on 08/07/2015, and 17.4 on 11/23/2015.  Bilateral diagnostic mammogram on 07/30/2015 revealed no evidence of malignancy.  Colonoscopy on 08/28/2015 revealed one 4 mm polyp in the rectum (tubular adenoma).  Symptomatically, she is fatigued at the end of the day.  She is doing well on Arimidex.  She continues to have left nipple drainage.    Plan: 1.  Labs today: CBC with diff, CMP, CA27.29. 2.  Follow-up mammogram on 02/29/2016. 3.  Follow-up with Dr. Azalee Course re: nipple discharge. 4.  Continue Arimidex.  5.  Influenza vaccine today 6.  RTC in 4 months for MD assessment and labs (CBC with diff, CMP, CA27.29).   Lequita Asal, MD  02/22/2016, 4:10 PM

## 2016-02-22 ENCOUNTER — Encounter: Payer: Self-pay | Admitting: Hematology and Oncology

## 2016-02-22 ENCOUNTER — Inpatient Hospital Stay: Payer: Commercial Managed Care - HMO | Attending: Hematology and Oncology | Admitting: Hematology and Oncology

## 2016-02-22 ENCOUNTER — Inpatient Hospital Stay: Payer: Commercial Managed Care - HMO

## 2016-02-22 VITALS — BP 162/90 | HR 76 | Temp 97.6°F | Resp 18 | Wt 205.6 lb

## 2016-02-22 DIAGNOSIS — F17210 Nicotine dependence, cigarettes, uncomplicated: Secondary | ICD-10-CM | POA: Insufficient documentation

## 2016-02-22 DIAGNOSIS — C50912 Malignant neoplasm of unspecified site of left female breast: Secondary | ICD-10-CM | POA: Insufficient documentation

## 2016-02-22 DIAGNOSIS — N6452 Nipple discharge: Secondary | ICD-10-CM

## 2016-02-22 DIAGNOSIS — E119 Type 2 diabetes mellitus without complications: Secondary | ICD-10-CM | POA: Diagnosis not present

## 2016-02-22 DIAGNOSIS — M8580 Other specified disorders of bone density and structure, unspecified site: Secondary | ICD-10-CM

## 2016-02-22 DIAGNOSIS — Z79899 Other long term (current) drug therapy: Secondary | ICD-10-CM | POA: Diagnosis not present

## 2016-02-22 DIAGNOSIS — Z170 Estrogen receptor positive status [ER+]: Secondary | ICD-10-CM | POA: Diagnosis not present

## 2016-02-22 DIAGNOSIS — M1990 Unspecified osteoarthritis, unspecified site: Secondary | ICD-10-CM | POA: Diagnosis not present

## 2016-02-22 DIAGNOSIS — I10 Essential (primary) hypertension: Secondary | ICD-10-CM | POA: Insufficient documentation

## 2016-02-22 DIAGNOSIS — Z923 Personal history of irradiation: Secondary | ICD-10-CM | POA: Insufficient documentation

## 2016-02-22 DIAGNOSIS — Z23 Encounter for immunization: Secondary | ICD-10-CM | POA: Insufficient documentation

## 2016-02-22 DIAGNOSIS — Z7982 Long term (current) use of aspirin: Secondary | ICD-10-CM | POA: Insufficient documentation

## 2016-02-22 DIAGNOSIS — K621 Rectal polyp: Secondary | ICD-10-CM | POA: Insufficient documentation

## 2016-02-22 DIAGNOSIS — Z7984 Long term (current) use of oral hypoglycemic drugs: Secondary | ICD-10-CM | POA: Diagnosis not present

## 2016-02-22 LAB — COMPREHENSIVE METABOLIC PANEL
ALT: 19 U/L (ref 14–54)
AST: 18 U/L (ref 15–41)
Albumin: 4.3 g/dL (ref 3.5–5.0)
Alkaline Phosphatase: 89 U/L (ref 38–126)
Anion gap: 8 (ref 5–15)
BUN: 13 mg/dL (ref 6–20)
CO2: 25 mmol/L (ref 22–32)
Calcium: 9.7 mg/dL (ref 8.9–10.3)
Chloride: 105 mmol/L (ref 101–111)
Creatinine, Ser: 0.82 mg/dL (ref 0.44–1.00)
GFR calc Af Amer: >60 mL/min (ref >60)
GFR calc non Af Amer: >60 mL/min (ref >60)
Glucose, Bld: 106 mg/dL — ABNORMAL HIGH (ref 65–99)
Potassium: 3.9 mmol/L (ref 3.5–5.1)
Sodium: 138 mmol/L (ref 135–145)
Total Bilirubin: 0.4 mg/dL (ref 0.3–1.2)
Total Protein: 7.7 g/dL (ref 6.5–8.1)

## 2016-02-22 LAB — CBC WITH DIFFERENTIAL/PLATELET
Basophils Absolute: 0.1 10*3/uL (ref 0–0.1)
Basophils Relative: 1 %
Eosinophils Absolute: 0.1 10*3/uL (ref 0–0.7)
Eosinophils Relative: 2 %
HCT: 40.4 % (ref 35.0–47.0)
Hemoglobin: 13.5 g/dL (ref 12.0–16.0)
Lymphocytes Relative: 34 %
Lymphs Abs: 2.2 10*3/uL (ref 1.0–3.6)
MCH: 27.1 pg (ref 26.0–34.0)
MCHC: 33.3 g/dL (ref 32.0–36.0)
MCV: 81.2 fL (ref 80.0–100.0)
Monocytes Absolute: 0.4 10*3/uL (ref 0.2–0.9)
Monocytes Relative: 6 %
Neutro Abs: 3.7 10*3/uL (ref 1.4–6.5)
Neutrophils Relative %: 57 %
Platelets: 209 10*3/uL (ref 150–440)
RBC: 4.97 MIL/uL (ref 3.80–5.20)
RDW: 14.2 % (ref 11.5–14.5)
WBC: 6.4 10*3/uL (ref 3.6–11.0)

## 2016-02-22 MED ORDER — INFLUENZA VAC SPLIT QUAD 0.5 ML IM SUSY
0.5000 mL | PREFILLED_SYRINGE | Freq: Once | INTRAMUSCULAR | Status: AC
  Administered 2016-02-22: 0.5 mL via INTRAMUSCULAR

## 2016-02-22 NOTE — Progress Notes (Signed)
Patient is here today for follow up, she mentions some dizziness.   Sitting BP 169/108 hr 76 Standing 165/90 hr 84   She is going to see cardiology soon and needing to repeat mamogram

## 2016-02-23 ENCOUNTER — Other Ambulatory Visit: Payer: Self-pay | Admitting: *Deleted

## 2016-02-23 DIAGNOSIS — C50912 Malignant neoplasm of unspecified site of left female breast: Secondary | ICD-10-CM

## 2016-02-23 LAB — CANCER ANTIGEN 27.29: CA 27.29: 13.6 U/mL (ref 0.0–38.6)

## 2016-02-29 ENCOUNTER — Ambulatory Visit
Admission: RE | Admit: 2016-02-29 | Discharge: 2016-02-29 | Disposition: A | Discharge status: Home / Self Care | Payer: Commercial Managed Care - HMO | Source: Ambulatory Visit | Attending: Surgery | Admitting: Surgery

## 2016-02-29 ENCOUNTER — Ambulatory Visit: Payer: Commercial Managed Care - HMO | Admitting: Internal Medicine

## 2016-02-29 ENCOUNTER — Other Ambulatory Visit: Payer: Self-pay | Admitting: Surgery

## 2016-02-29 DIAGNOSIS — C50912 Malignant neoplasm of unspecified site of left female breast: Secondary | ICD-10-CM | POA: Insufficient documentation

## 2016-02-29 DIAGNOSIS — N6320 Unspecified lump in the left breast, unspecified quadrant: Secondary | ICD-10-CM | POA: Diagnosis not present

## 2016-02-29 DIAGNOSIS — R922 Inconclusive mammogram: Secondary | ICD-10-CM | POA: Diagnosis not present

## 2016-02-29 HISTORY — DX: Personal history of irradiation: Z92.3

## 2016-02-29 HISTORY — DX: Personal history of antineoplastic chemotherapy: Z92.21

## 2016-03-01 ENCOUNTER — Ambulatory Visit: Payer: Commercial Managed Care - HMO | Admitting: Surgery

## 2016-03-04 ENCOUNTER — Ambulatory Visit: Payer: Commercial Managed Care - HMO | Admitting: Surgery

## 2016-03-08 ENCOUNTER — Ambulatory Visit
Admission: RE | Admit: 2016-03-08 | Discharge: 2016-03-08 | Disposition: A | Discharge status: Home / Self Care | Payer: Commercial Managed Care - HMO | Source: Ambulatory Visit | Attending: Surgery | Admitting: Surgery

## 2016-03-08 DIAGNOSIS — D242 Benign neoplasm of left breast: Secondary | ICD-10-CM | POA: Diagnosis not present

## 2016-03-08 DIAGNOSIS — N6320 Unspecified lump in the left breast, unspecified quadrant: Secondary | ICD-10-CM | POA: Diagnosis not present

## 2016-03-08 DIAGNOSIS — N6342 Unspecified lump in left breast, subareolar: Secondary | ICD-10-CM | POA: Diagnosis not present

## 2016-03-08 HISTORY — PX: BREAST BIOPSY: SHX20

## 2016-03-09 LAB — SURGICAL PATHOLOGY

## 2016-03-10 ENCOUNTER — Telehealth: Payer: Self-pay

## 2016-03-10 NOTE — Telephone Encounter (Signed)
Received call from [place] at [facility] at this time. She called to provide results from ultrasound Biopsy from 03/08/16-Papiloma. Radiologist Dr.Lynn notified patient  Of results and that she needs surgical consult. Dr.Loflin has been notified.

## 2016-03-11 ENCOUNTER — Encounter: Payer: Self-pay | Admitting: Surgery

## 2016-03-11 ENCOUNTER — Ambulatory Visit (INDEPENDENT_AMBULATORY_CARE_PROVIDER_SITE_OTHER): Payer: Commercial Managed Care - HMO | Admitting: Surgery

## 2016-03-11 VITALS — BP 172/92 | HR 92 | Temp 98.4°F | Ht 67.0 in | Wt 205.0 lb

## 2016-03-11 DIAGNOSIS — D242 Benign neoplasm of left breast: Secondary | ICD-10-CM

## 2016-03-11 NOTE — Patient Instructions (Signed)
Your surgery will be on the week of November 6th. Angie will be calling you to give you the exact date. If you have any questions or concerns, please contact us.

## 2016-03-11 NOTE — Progress Notes (Signed)
Surgical H&P  03/11/2016  Donna Riley is an 55 y.o. 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  HPI: Patient is well known to the surgical service as she has a prior lumpectomy back in March 2016 with Dr. 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  I saw her back in March 2017 for her follow-up visit from this and she still had some burns from her radiation. She was starting to have some nipple discharge at that time as well as pain in the area of scar. I had her return today for her regular 6 month follow-up however in the meantime she started to have increased tenderness in the area felt a mass and had nipple discharge. She has gone for a repeat mammogram which was BI-RADS for for a 1.5 5.7 cm area in the left retroareolar region and was biopsy proven to be an intraductal papilloma. The patient states that she's continued to have some pain in this area as well as some discharge that is looked either milky or yellow in color. She has not had any fever chills or any swelling in the area. She has noted the lump underneath that area which is not enlarged. Otherwise her pain and the redness and burning sensation has improved.    Past Medical History:  Diagnosis Date  . Arthritis    shoulders and neck  . Breast cancer ([facility]) 2016    Left breast with chemo + rad tx's with lumpectomy.  . Diabetes mellitus without complication ([place]) [id number]  . Elevated LFTs   . Hypertension   . Personal history of chemotherapy   . Personal history of radiation therapy   . Positive PPD, treated   . Shortness of breath dyspnea    with exertion    Past Surgical History:  Procedure Laterality Date  . BREAST BIOPSY Left 07/31/2014   positive. Lumpectomy 08/27/2014 with chemo and rad  . BREAST BIOPSY Left 03/08/2016   path pending  . BREAST LUMPECTOMY WITH NEEDLE LOCALIZATION AND AXILLARY SENTINEL LYMPH NODE BX Left 08/27/14  .  CHOLECYSTECTOMY    . COLONOSCOPY WITH PROPOFOL N/A 08/28/2015   Procedure: COLONOSCOPY WITH PROPOFOL;  Surgeon: Lucilla Lame, MD;  Location: [facility];  Service: Endoscopy;  Laterality: N/A;  Diabetic oral  . ESOPHAGOGASTRODUODENOSCOPY  2014   gastritis  . POLYPECTOMY  08/28/2015   Procedure: POLYPECTOMY INTESTINAL;  Surgeon: Lucilla Lame, MD;  Location: [facility];  Service: Endoscopy;;  Rectal polyp  . TUBAL LIGATION Bilateral   . VENTRAL HERNIA REPAIR N/A 04/10/2015   Procedure: HERNIA REPAIR VENTRAL ADULT;  Surgeon: Leonie Green, MD;  Location: [facility];  Service: General;  Laterality: N/A;    Family History  Problem Relation Age of Onset  . Cancer Mother     Pancreatic  . Cancer Father     Throat  . Cancer Maternal Grandmother     Breast  . Breast cancer Maternal Grandmother 75    Social History:  reports that she has been smoking Cigarettes.  She has a 38.00 pack-year smoking history. She has never used smokeless tobacco. She reports that she drinks alcohol. She reports that she does not use drugs.  Allergies: No Known Allergies  Medications reviewed.  Review of Systems  Constitutional: Negative for chills, fever, malaise/fatigue and weight loss.  HENT: Negative for congestion.   Respiratory: Negative for cough, hemoptysis, sputum production, shortness of breath and wheezing.  Cardiovascular: Negative for chest pain, palpitations, claudication and leg swelling.  Gastrointestinal: Negative for abdominal pain, nausea and vomiting.  Genitourinary: Negative for dysuria and frequency.  Musculoskeletal: Negative for back pain.  Skin: Negative for itching and rash.  Neurological: Negative for dizziness, loss of consciousness, weakness and headaches.  Psychiatric/Behavioral: The patient is not nervous/anxious.   All other systems reviewed and are negative.    Physical Exam:  BP (!) 172/92 (BP Location: Right Arm, Patient Position: Sitting)   Pulse  92   Temp 98.4 F (36.9 C) (Oral)   Ht 5\' 7"  (1.702 m)   Wt 205 lb (93 kg)   BMI 32.11 kg/m   Physical Exam  Constitutional: She is oriented to person, place, and time. She appears well-developed and well-nourished. No distress.  HENT:  Head: Normocephalic and atraumatic.  Right Ear: External ear normal.  Left Ear: External ear normal.  Nose: Nose normal.  Mouth/Throat: Oropharynx is clear and moist. No oropharyngeal exudate.  Eyes: Conjunctivae and EOM are normal. Pupils are equal, round, and reactive to light. No scleral icterus.  Neck: Normal range of motion. Neck supple. No tracheal deviation present.  Cardiovascular: Normal rate, regular rhythm, normal heart sounds and intact distal pulses.  Exam reveals no gallop and no friction rub.   No murmur heard. Pulmonary/Chest: Effort normal and breath sounds normal. No respiratory distress. She has no wheezes. She has no rales.  BreastExam Left breast: 1cm oblong retroareolar rubbery mobile mass, tender to palpation, serous nipple discharge with manipulation of this area, no further masses or skin changes, no lymphadenopathy  Right breast: normal skin, no changes, no palpable masses, no nipple discharge or retraction, no lymphadenopathy  Abdominal: Soft. Bowel sounds are normal. She exhibits no distension. There is no tenderness. There is no rebound and no guarding.  Musculoskeletal: Normal range of motion. She exhibits no edema, tenderness or deformity.  Neurological: She is alert and oriented to person, place, and time. No cranial nerve deficit.  Skin: Skin is warm and dry. No rash noted. No erythema. No pallor.  Psychiatric: She has a normal mood and affect. Her behavior is normal. Judgment and thought content normal.  Vitals reviewed.    Mammogram/US 02/29/2016:  On physical exam, clear nipple discharge is expressed from the central aspect of the left nipple.  Targeted ultrasound is performed, showing an intraductal mass in  the left retroareolar region measuring 1.5 x 0.6 x 0.7 cm. Targeted ultrasound of the left axilla demonstrates normal appearing axillary lymph nodes.  IMPRESSION: Indeterminate left breast mass.  RECOMMENDATION: Ultrasound-guided biopsy of the left retroareolar mass. If biopsy results demonstrate malignancy and breast conservation is being considered, bilateral breast MRI with contrast is recommended for evaluation of extent of disease.  I have discussed the findings and recommendations with the patient. Results were also provided in writing at the conclusion of the visit. If applicable, a reminder letter will be sent to the patient regarding the next appointment.  BI-RADS CATEGORY  4: Suspicious.  Clip placement: FINDINGS: Mammographic images were obtained following ultrasound guided biopsy of left subareolar presumably intraductal breast mass. Two-view mammography demonstrates presence of spiral shaped HydroMARK, shape 4, in the subareolar left breast. Expected post biopsy changes are seen.  IMPRESSION: Successful placement of post biopsy tissue marker post ultrasound-guided core needle biopsy of left subareolar breast mass.  Final Assessment: Post Procedure Mammograms for Marker Placement      Assessment/Plan: Donna Riley is an 55 y.o. 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 Patient is well-known to me as I have previously seen her proximally 6 months before for follow-up of breast cancer. She now has a intraductal papilloma in her left breast. I have personally reviewed her images which do show a approximately 2 cm mass in the retroareolar space. This does correspond with Y can feel clinically. I discussed with her that given she had the new hydro-mark clip that was placed we would do a needle biopsy in the area. I also discussed with her the pathology of an intraductal papilloma which is not cancerous but we do take them  out to decrease the nipple drainage and because they do have a rare 10% risk of carcinoma. I did discuss with the patient that she does not need to have any lymph nodes with this as it is not currently cancerous. The patient was given opportunity to ask questions and have them answered. I discussed with her that we will make an incision and removed the area and will ensure that she has no more nipple drainage in the operating room.  I discussed the risk benefits alternatives and expected outcomes of a lumpectomy with the patient. These included bleeding, infection, positive margins, need for further resection and damage to structures in the area reduced figure. I also discussed anesthetic risks such as heart attack, stroke, blood clots in the legs are long-standing and rarely death. The patient was given opportunity to ask questions and have them answered patient is in agreement with the plan. I will schedule her for a left needle localized breast lumpectomy during the first week of November.  Chessie Neuharth L. Yukari Flax MD General Surgeon  03/11/2016,5:02 PM

## 2016-03-14 ENCOUNTER — Ambulatory Visit: Payer: Commercial Managed Care - HMO | Admitting: Gastroenterology

## 2016-03-14 ENCOUNTER — Ambulatory Visit: Payer: Commercial Managed Care - HMO | Admitting: Internal Medicine

## 2016-03-14 ENCOUNTER — Other Ambulatory Visit: Payer: Self-pay | Admitting: Surgery

## 2016-03-14 DIAGNOSIS — D242 Benign neoplasm of left breast: Secondary | ICD-10-CM

## 2016-03-16 ENCOUNTER — Telehealth: Payer: Self-pay | Admitting: Surgery

## 2016-03-16 NOTE — Telephone Encounter (Signed)
Pt advised of pre op date/time and sx date. Sx: 04/05/16 with Dr Carole Binning lumpectomy with NL.  Pre op: 03/28/16 @ 1:45pm--office.   Patient advised to arrive the day of surgery at 8:00am at [facility].   Patient verbalized understanding all information.

## 2016-03-18 ENCOUNTER — Other Ambulatory Visit: Payer: Self-pay | Admitting: Internal Medicine

## 2016-03-18 DIAGNOSIS — I10 Essential (primary) hypertension: Secondary | ICD-10-CM

## 2016-03-23 ENCOUNTER — Ambulatory Visit
Admission: RE | Admit: 2016-03-23 | Discharge: 2016-03-23 | Disposition: A | Discharge status: Home / Self Care | Payer: Commercial Managed Care - HMO | Source: Ambulatory Visit | Attending: Radiation Oncology | Admitting: Radiation Oncology

## 2016-03-23 ENCOUNTER — Encounter: Payer: Self-pay | Admitting: Radiation Oncology

## 2016-03-23 VITALS — BP 158/91 | HR 82 | Temp 97.1°F | Wt 206.5 lb

## 2016-03-23 DIAGNOSIS — Z170 Estrogen receptor positive status [ER+]: Secondary | ICD-10-CM | POA: Diagnosis not present

## 2016-03-23 DIAGNOSIS — C50912 Malignant neoplasm of unspecified site of left female breast: Secondary | ICD-10-CM | POA: Diagnosis not present

## 2016-03-23 NOTE — Progress Notes (Signed)
Radiation Oncology Follow up Note  Name: Donna Riley   Date:   03/23/2016 MRN:  [id number] DOB: July 25, 1960    This 56 y.o. female presents to the clinic today for 1 year follow-up for stage IIa invasive mammary carcinoma of the left breast.  REFERRING PROVIDER: Glean Hess, MD  HPI: Patient is a 55 year old female now out 1 year having completed whole breast radiation to her left breast for a stage IIa (T1 b N1 M0) ER/PR positive HER-2/neu negative invasive mammary carcinoma. She is continuing have problems with drainage from her left breast. This is prompted recent biopsy after. Mammogram and targeted ultrasound showed a 1.5 cm lesion in the left retroareolar region showing intraductal mass. Biopsy this was benign she is scheduled for reexcision of this next week. She continues to have some upper back pain is had plain films of that area showing no evidence of disease. She is currently on Arimidex and tolerating that well without side effect.  COMPLICATIONS OF TREATMENT: none  FOLLOW UP COMPLIANCE: keeps appointments   PHYSICAL EXAM:  BP (!) 158/91   Pulse 82   Temp 97.1 F (36.2 C)   Wt 206 lb 7.4 oz (93.6 kg)   BMI 32.34 kg/m  Left breast has some nodular densities present in the subareolar tissue. No other dominant mass or nodularity is noted in either breast in 2 positions examined. No axillary or supraclavicular adenopathy is identified. Well-developed well-nourished patient in NAD. HEENT reveals PERLA, EOMI, discs not visualized.  Oral cavity is clear. No oral mucosal lesions are identified. Neck is clear without evidence of cervical or supraclavicular adenopathy. Lungs are clear to A&P. Cardiac examination is essentially unremarkable with regular rate and rhythm without murmur rub or thrill. Abdomen is benign with no organomegaly or masses noted. Motor sensory and DTR levels are equal and symmetric in the upper and lower extremities. Cranial nerves II through XII are  grossly intact. Proprioception is intact. No peripheral adenopathy or edema is identified. No motor or sensory levels are noted. Crude visual fields are within normal range.  RADIOLOGY RESULTS: Mammograms and ultrasound reviewed and compatible with the above-stated findings  PLAN: At this time patient will have reexcision of her left breast. Will review the pathology when it becomes available. I've asked to see her out in follow-up a week after her surgery. She otherwise continues on aromatase without side effect. Further recommendations will be seen in follow-up appointment.  I would like to take this opportunity to thank you for allowing me to participate in the care of your patient.Armstead Peaks., MD

## 2016-03-28 ENCOUNTER — Encounter
Admission: RE | Admit: 2016-03-28 | Discharge: 2016-03-28 | Disposition: A | Discharge status: Home / Self Care | Payer: Commercial Managed Care - HMO | Source: Ambulatory Visit | Attending: Surgery | Admitting: Surgery

## 2016-03-28 ENCOUNTER — Telehealth: Payer: Self-pay | Admitting: *Deleted

## 2016-03-28 DIAGNOSIS — Z01818 Encounter for other preprocedural examination: Secondary | ICD-10-CM | POA: Insufficient documentation

## 2016-03-28 LAB — BASIC METABOLIC PANEL
Anion gap: 10 (ref 5–15)
BUN: 8 mg/dL (ref 6–20)
CO2: 23 mmol/L (ref 22–32)
Calcium: 9.6 mg/dL (ref 8.9–10.3)
Chloride: 103 mmol/L (ref 101–111)
Creatinine, Ser: 0.71 mg/dL (ref 0.44–1.00)
GFR calc Af Amer: >60 mL/min (ref >60)
GFR calc non Af Amer: >60 mL/min (ref >60)
Glucose, Bld: 95 mg/dL (ref 65–99)
Potassium: 3.6 mmol/L (ref 3.5–5.1)
Sodium: 136 mmol/L (ref 135–145)

## 2016-03-28 LAB — CBC WITH DIFFERENTIAL/PLATELET
Basophils Absolute: 0.1 10*3/uL (ref 0–0.1)
Basophils Relative: 1 %
Eosinophils Absolute: 0.1 10*3/uL (ref 0–0.7)
Eosinophils Relative: 1 %
HCT: 40.4 % (ref 35.0–47.0)
Hemoglobin: 13.5 g/dL (ref 12.0–16.0)
Lymphocytes Relative: 29 %
Lymphs Abs: 2.4 10*3/uL (ref 1.0–3.6)
MCH: 27.1 pg (ref 26.0–34.0)
MCHC: 33.4 g/dL (ref 32.0–36.0)
MCV: 81.2 fL (ref 80.0–100.0)
Monocytes Absolute: 0.4 10*3/uL (ref 0.2–0.9)
Monocytes Relative: 5 %
Neutro Abs: 5.3 10*3/uL (ref 1.4–6.5)
Neutrophils Relative %: 64 %
Platelets: 202 10*3/uL (ref 150–440)
RBC: 4.98 MIL/uL (ref 3.80–5.20)
RDW: 13.9 % (ref 11.5–14.5)
WBC: 8.2 10*3/uL (ref 3.6–11.0)

## 2016-03-28 NOTE — Telephone Encounter (Signed)
  Is her surgery with Dr. Azalee Course?  We can see her back after her surgery to review her pathology.  M

## 2016-03-28 NOTE — Telephone Encounter (Signed)
Called to report that she is having surgery on 11/2 and states she does not have fu with oncology until January. Asking if she needs to see Dr Mike Gip sooner than January. Please advise

## 2016-03-28 NOTE — Telephone Encounter (Signed)
Yes Dr Azalee Course is doing surgery on 11/7, not 11/2 and I will talk to her about seeing her back post op

## 2016-03-28 NOTE — Patient Instructions (Signed)
Your procedure is scheduled on: April 05, 2016 Su procedimiento est programado para: Report to MAMMOGRAPHY AT 7:45 AM  SAME DAY SURGERY: [phone]  Remember: Instructions that are not followed completely may result in serious medical risk, up to and including death, or upon the discretion of your surgeon and anesthesiologist your surgery may need to be rescheduled.  Recuerde: Las instrucciones que no se siguen completamente Heritage manager en un riesgo de salud grave, incluyendo hasta la [place] o a discrecin de su cirujano y Environmental health practitioner, su ciruga se puede posponer.   _X___ 1. Do not eat food or drink liquids after midnight. No gum chewing or hard candies.  No coma alimentos ni tome lquidos despus de la medianoche.  No mastique chicle ni caramelos  duros.     _X___ 2. No alcohol for 24 hours before or after surgery.    No tome alcohol durante las 24 horas antes ni despus de la [place].   _X___ 3. Bring all medications with you on the day of surgery if instructed. BRING ANY NEW MEDICATIONS TO HOSPITAL    Lleve todos los medicamentos con usted el da de su ciruga si se le ha indicado as.   __X_ 4. Notify your doctor if there is any change in your medical condition (cold, fever,                             infections).    Informe a su mdico si hay algn cambio en su condicin mdica (resfriado, fiebre, infecciones).   Do not wear jewelry, make-up, hairpins, clips or nail polish.  No use joyas, maquillajes, pinzas/ganchos para el cabello ni esmalte de uas.  Do not wear lotions, powders, or perfumes. You may NOTwear deodorant.  No use lociones, polvos o perfumes.  Puede usar desodorante.    Do not shave 48 hours prior to surgery. Men may shave face and neck.  No se afeite 48 horas antes de la [place].  Los hombres pueden [employer] cara y el cuello.   Do not bring valuables to the hospital.   No lleve objetos [place] is not responsible for any  belongings or valuables.  E. Lopez no se hace responsable de ningn tipo de pertenencias u objetos de Geographical information systems officer.               Contacts, dentures or bridgework may not be worn into surgery.  Los lentes de [place], las dentaduras postizas o puentes no se pueden usar en la [place].  Leave your suitcase in the car. After surgery it may be brought to your room.  Deje su maleta en el auto.  Despus de la ciruga podr traerla a su habitacin.  For patients admitted to the hospital, discharge time is determined by your treatment team.  Para los pacientes que sean ingresados al hospital, el tiempo en el cual se le dar de alta es determinado por su                equipo de [place].   Patients discharged the day of surgery will not be allowed to drive home. A los pacientes que se les da de alta el mismo da de la ciruga no se les permitir conducir a Holiday representative.   Please read over the following fact sheets that you were given: Por favor [place] informacin que le dieron:  CHG INSTRUCTIONS   __X__ Take these medicines the morning of surgery with A SIP OF WATER:          [employer] estas medicinas la maana de la ciruga con UN SORBO DE AGUA:  1. AMLODIPINE   2. LISOPRIL  3. ANASTROZOLE  4.       5.  6.  ____ Fleet Enema (as directed)          Enema de Fleet (segn lo indicado)    __X__ Use CHG Soap as directed          Utilice el jabn de CHG segn lo indicado  ____ Use inhalers on the day of surgery          Use los inhaladores el da de la ciruga  __X__ Stop metformin 2 days prior to surgery NONE AFTER November 4TH          [place] de tomar el metformin 2 das antes de la [place]    ____ Take 1/2 of usual insulin dose the night before surgery and none on the morning of surgery           Tome la mitad de la dosis habitual de insulina la noche antes de la [place] y no tome nada en la maana de la             ciruga  __X__ Stop Coumadin/Plavix/aspirin UNTIL  AFTER SURGERY          Deje de tomar el Coumadin/Plavix/aspirina el da:  __X__ Stop Anti-inflammatories on IBUPROFEN  ADVIL MOTRIN ALEVE NAPROXEN NO BC POWDER - USE ONLY TYLENOL FOR PAIN          Deje de tomar antiinflamatorios el da:   ____ Stop supplements until after surgery            Deje de tomar suplementos hasta despus de la ciruga  ____ Bring C-Pap to the hospital          [place] al hospital

## 2016-04-05 ENCOUNTER — Encounter
Admission: RE | Disposition: A | Discharge status: Home / Self Care | Payer: Self-pay | Source: Ambulatory Visit | Attending: Surgery

## 2016-04-05 ENCOUNTER — Ambulatory Visit
Admission: RE | Admit: 2016-04-05 | Discharge: 2016-04-05 | Disposition: A | Discharge status: Home / Self Care | Payer: Commercial Managed Care - HMO | Source: Ambulatory Visit | Attending: Surgery | Admitting: Surgery

## 2016-04-05 ENCOUNTER — Encounter: Payer: Self-pay | Admitting: *Deleted

## 2016-04-05 ENCOUNTER — Ambulatory Visit: Payer: Commercial Managed Care - HMO | Admitting: Anesthesiology

## 2016-04-05 DIAGNOSIS — E1142 Type 2 diabetes mellitus with diabetic polyneuropathy: Secondary | ICD-10-CM | POA: Insufficient documentation

## 2016-04-05 DIAGNOSIS — Z923 Personal history of irradiation: Secondary | ICD-10-CM | POA: Insufficient documentation

## 2016-04-05 DIAGNOSIS — Z803 Family history of malignant neoplasm of breast: Secondary | ICD-10-CM | POA: Diagnosis not present

## 2016-04-05 DIAGNOSIS — N641 Fat necrosis of breast: Secondary | ICD-10-CM | POA: Insufficient documentation

## 2016-04-05 DIAGNOSIS — N6032 Fibrosclerosis of left breast: Secondary | ICD-10-CM | POA: Diagnosis not present

## 2016-04-05 DIAGNOSIS — F17210 Nicotine dependence, cigarettes, uncomplicated: Secondary | ICD-10-CM | POA: Diagnosis not present

## 2016-04-05 DIAGNOSIS — D242 Benign neoplasm of left breast: Secondary | ICD-10-CM

## 2016-04-05 DIAGNOSIS — Z800 Family history of malignant neoplasm of digestive organs: Secondary | ICD-10-CM | POA: Insufficient documentation

## 2016-04-05 DIAGNOSIS — I10 Essential (primary) hypertension: Secondary | ICD-10-CM | POA: Diagnosis not present

## 2016-04-05 DIAGNOSIS — Z9221 Personal history of antineoplastic chemotherapy: Secondary | ICD-10-CM | POA: Insufficient documentation

## 2016-04-05 DIAGNOSIS — K589 Irritable bowel syndrome without diarrhea: Secondary | ICD-10-CM | POA: Insufficient documentation

## 2016-04-05 DIAGNOSIS — Z853 Personal history of malignant neoplasm of breast: Secondary | ICD-10-CM | POA: Insufficient documentation

## 2016-04-05 DIAGNOSIS — Z7984 Long term (current) use of oral hypoglycemic drugs: Secondary | ICD-10-CM | POA: Diagnosis not present

## 2016-04-05 DIAGNOSIS — N6342 Unspecified lump in left breast, subareolar: Secondary | ICD-10-CM | POA: Diagnosis not present

## 2016-04-05 DIAGNOSIS — M19011 Primary osteoarthritis, right shoulder: Secondary | ICD-10-CM | POA: Diagnosis not present

## 2016-04-05 DIAGNOSIS — M19012 Primary osteoarthritis, left shoulder: Secondary | ICD-10-CM | POA: Insufficient documentation

## 2016-04-05 DIAGNOSIS — N6022 Fibroadenosis of left breast: Secondary | ICD-10-CM | POA: Insufficient documentation

## 2016-04-05 DIAGNOSIS — N6320 Unspecified lump in the left breast, unspecified quadrant: Secondary | ICD-10-CM | POA: Diagnosis present

## 2016-04-05 DIAGNOSIS — Z7982 Long term (current) use of aspirin: Secondary | ICD-10-CM | POA: Diagnosis not present

## 2016-04-05 HISTORY — PX: BREAST LUMPECTOMY: SHX2

## 2016-04-05 HISTORY — PX: BREAST LUMPECTOMY WITH NEEDLE LOCALIZATION: SHX5759

## 2016-04-05 LAB — GLUCOSE, CAPILLARY
Glucose-Capillary: 112 mg/dL — ABNORMAL HIGH (ref 65–99)
Glucose-Capillary: 101 mg/dL — ABNORMAL HIGH (ref 65–99)

## 2016-04-05 SURGERY — BREAST LUMPECTOMY WITH NEEDLE LOCALIZATION
Anesthesia: General | Laterality: Left | Wound class: Clean

## 2016-04-05 MED ORDER — KETOROLAC TROMETHAMINE 30 MG/ML IJ SOLN
INTRAMUSCULAR | Status: DC | PRN
  Administered 2016-04-05: 30 mg via INTRAVENOUS

## 2016-04-05 MED ORDER — ONDANSETRON HCL 4 MG/2ML IJ SOLN: 4.0000 mg | Freq: Once | INTRAMUSCULAR | Status: DC | PRN

## 2016-04-05 MED ORDER — CHLORHEXIDINE GLUCONATE CLOTH 2 % EX PADS: 6.0000 | MEDICATED_PAD | Freq: Once | CUTANEOUS | Status: DC

## 2016-04-05 MED ORDER — PROPOFOL 10 MG/ML IV BOLUS: INTRAVENOUS | Status: DC | PRN

## 2016-04-05 MED ORDER — METHYLENE BLUE 0.5 % INJ SOLN
INTRAVENOUS | Status: AC
  Filled 2016-04-05: qty 10

## 2016-04-05 MED ORDER — CEFAZOLIN SODIUM-DEXTROSE 2-4 GM/100ML-% IV SOLN
INTRAVENOUS | Status: AC
  Filled 2016-04-05: qty 100

## 2016-04-05 MED ORDER — FENTANYL CITRATE (PF) 100 MCG/2ML IJ SOLN
25.0000 ug | INTRAMUSCULAR | Status: DC | PRN
  Administered 2016-04-05 (×2): 25 ug via INTRAVENOUS

## 2016-04-05 MED ORDER — SUGAMMADEX SODIUM 200 MG/2ML IV SOLN
INTRAVENOUS | Status: DC | PRN
  Administered 2016-04-05: 186 mg via INTRAVENOUS

## 2016-04-05 MED ORDER — ONDANSETRON HCL 4 MG/2ML IJ SOLN
INTRAMUSCULAR | Status: DC | PRN
  Administered 2016-04-05: 4 mg via INTRAVENOUS

## 2016-04-05 MED ORDER — CEFAZOLIN SODIUM-DEXTROSE 2-4 GM/100ML-% IV SOLN
2.0000 g | INTRAVENOUS | Status: AC
  Administered 2016-04-05: 2 g via INTRAVENOUS

## 2016-04-05 MED ORDER — ROCURONIUM BROMIDE 100 MG/10ML IV SOLN
INTRAVENOUS | Status: DC | PRN
  Administered 2016-04-05: 40 mg via INTRAVENOUS
  Administered 2016-04-05: 10 mg via INTRAVENOUS
  Administered 2016-04-05: 5 mg via INTRAVENOUS
  Administered 2016-04-05: 7.5 mg via INTRAVENOUS

## 2016-04-05 MED ORDER — SODIUM CHLORIDE 0.9 % IV SOLN
INTRAVENOUS | Status: DC
  Administered 2016-04-05: 09:00:00 via INTRAVENOUS

## 2016-04-05 MED ORDER — BUPIVACAINE-EPINEPHRINE (PF) 0.5% -1:200000 IJ SOLN
INTRAMUSCULAR | Status: AC
  Filled 2016-04-05: qty 30

## 2016-04-05 MED ORDER — FENTANYL CITRATE (PF) 100 MCG/2ML IJ SOLN
INTRAMUSCULAR | Status: AC
  Administered 2016-04-05: 25 ug via INTRAVENOUS
  Filled 2016-04-05: qty 2

## 2016-04-05 MED ORDER — FAMOTIDINE 20 MG PO TABS
ORAL_TABLET | ORAL | Status: AC
  Administered 2016-04-05: 20 mg via ORAL
  Filled 2016-04-05: qty 1

## 2016-04-05 MED ORDER — FENTANYL CITRATE (PF) 100 MCG/2ML IJ SOLN
INTRAMUSCULAR | Status: DC | PRN
  Administered 2016-04-05: 100 ug via INTRAVENOUS
  Administered 2016-04-05 (×2): 25 ug via INTRAVENOUS

## 2016-04-05 MED ORDER — BUPIVACAINE-EPINEPHRINE (PF) 0.5% -1:200000 IJ SOLN
INTRAMUSCULAR | Status: DC | PRN
Start: 2016-04-05 — End: 2016-04-05
  Administered 2016-04-05: 9 mL via PERINEURAL

## 2016-04-05 MED ORDER — IBUPROFEN 800 MG PO TABS: 800.0000 mg | ORAL_TABLET | Freq: Three times a day (TID) | ORAL | 0 refills | Status: DC | PRN

## 2016-04-05 MED ORDER — LIDOCAINE HCL (CARDIAC) 20 MG/ML IV SOLN
INTRAVENOUS | Status: DC | PRN
  Administered 2016-04-05: 40 mg via INTRAVENOUS

## 2016-04-05 MED ORDER — HYDROCODONE-ACETAMINOPHEN 5-325 MG PO TABS
1.0000 | ORAL_TABLET | Freq: Four times a day (QID) | ORAL | 0 refills | Status: DC | PRN
Start: 2016-04-05 — End: 2016-04-15

## 2016-04-05 MED ORDER — FAMOTIDINE 20 MG PO TABS
20.0000 mg | ORAL_TABLET | Freq: Once | ORAL | Status: AC
  Administered 2016-04-05: 20 mg via ORAL

## 2016-04-05 MED ORDER — PROPOFOL 10 MG/ML IV BOLUS
INTRAVENOUS | Status: DC | PRN
  Administered 2016-04-05: 40 mg via INTRAVENOUS
  Administered 2016-04-05: 160 mg via INTRAVENOUS
  Administered 2016-04-05: 20 mg via INTRAVENOUS

## 2016-04-05 MED ORDER — LACTATED RINGERS IV SOLN
INTRAVENOUS | Status: DC | PRN
  Administered 2016-04-05: 11:00:00 via INTRAVENOUS

## 2016-04-05 MED ORDER — MIDAZOLAM HCL 2 MG/2ML IJ SOLN
INTRAMUSCULAR | Status: DC | PRN
  Administered 2016-04-05: 2 mg via INTRAVENOUS

## 2016-04-05 SURGICAL SUPPLY — 38 items
BLADE SURG 15 STRL LF DISP TIS (BLADE) ×1 IMPLANT
BLADE SURG 15 STRL SS (BLADE) ×1
CANISTER SUCT 1200ML W/VALVE (MISCELLANEOUS) ×2 IMPLANT
CHLORAPREP W/TINT 26ML (MISCELLANEOUS) ×2 IMPLANT
CNTNR SPEC 2.5X3XGRAD LEK (MISCELLANEOUS) ×3
CONT SPEC 4OZ STER OR WHT (MISCELLANEOUS) ×3
CONTAINER SPEC 2.5X3XGRAD LEK (MISCELLANEOUS) ×3 IMPLANT
COVER PROBE FLX POLY STRL (MISCELLANEOUS) ×2 IMPLANT
DEVICE DUBIN SPECIMEN MAMMOGRA (MISCELLANEOUS) ×2 IMPLANT
DRAPE LAPAROTOMY TRNSV 106X77 (MISCELLANEOUS) ×2 IMPLANT
DRAPE SHEET LG 3/4 BI-LAMINATE (DRAPES) ×2 IMPLANT
ELECT CAUTERY BLADE TIP 2.5 (TIP) ×2
ELECT REM PT RETURN 9FT ADLT (ELECTROSURGICAL) ×2
ELECTRODE CAUTERY BLDE TIP 2.5 (TIP) ×1 IMPLANT
ELECTRODE REM PT RTRN 9FT ADLT (ELECTROSURGICAL) ×1 IMPLANT
GLOVE PI ORTHOPRO 6.5 (GLOVE) ×1
GLOVE PI ORTHOPRO STRL 6.5 (GLOVE) ×1 IMPLANT
GOWN STRL REUS W/ TWL LRG LVL3 (GOWN DISPOSABLE) ×2 IMPLANT
GOWN STRL REUS W/TWL LRG LVL3 (GOWN DISPOSABLE) ×2
LIQUID BAND (GAUZE/BANDAGES/DRESSINGS) ×2 IMPLANT
NEEDLE FILTER BLUNT 18X 1/2SAF (NEEDLE) ×1
NEEDLE FILTER BLUNT 18X1 1/2 (NEEDLE) ×1 IMPLANT
NEEDLE HYPO 21X1.5 SAFETY (NEEDLE) ×2 IMPLANT
NEEDLE HYPO 25X1 1.5 SAFETY (NEEDLE) ×4 IMPLANT
PACK BASIN MINOR ARMC (MISCELLANEOUS) ×2 IMPLANT
SLEVE PROBE SENORX GAMMA FIND (MISCELLANEOUS) ×2 IMPLANT
SUCTION FRAZIER HANDLE 10FR (MISCELLANEOUS) ×1
SUCTION TUBE FRAZIER 10FR DISP (MISCELLANEOUS) ×1 IMPLANT
SURGI-BRA LG (MISCELLANEOUS) ×2 IMPLANT
SUT MNCRL 3-0 UNDYED SH (SUTURE) ×2 IMPLANT
SUT MNCRL 4-0 (SUTURE) ×1
SUT MNCRL 4-0 27XMFL (SUTURE) ×1
SUT MONOCRYL 3-0 UNDYED (SUTURE) ×2
SUT SILK 2 0 SH (SUTURE) ×2 IMPLANT
SUT SILK 3 0 SH 30 (SUTURE) ×2 IMPLANT
SUTURE MNCRL 4-0 27XMF (SUTURE) ×1 IMPLANT
SYR 3ML LL SCALE MARK (SYRINGE) ×2 IMPLANT
SYRINGE 10CC LL (SYRINGE) ×4 IMPLANT

## 2016-04-05 NOTE — Interval H&P Note (Signed)
History and Physical Interval Note:  04/05/2016 11:13 AM  Donna Riley  has presented today for surgery, with the diagnosis of Left breast papilloma  The various methods of treatment have been discussed with the patient and family. After consideration of risks, benefits and other options for treatment, the patient has consented to  Procedure(s): BREAST LUMPECTOMY WITH NEEDLE LOCALIZATION (Left) as a surgical intervention .  The patient's history has been reviewed, patient examined, no change in status, stable for surgery.  I have reviewed the patient's chart and labs.  Questions were answered to the patient's satisfaction.     [name]

## 2016-04-05 NOTE — Op Note (Signed)
Breast Lumpectomy with Sentinal Node Biopsy Procedure Note  Indications: This patient presents with history of a left breast mass. Given the clinical history and physical exam, along with indicated diagnostic studies, breast biopsy will be performed.  Pre-operative Diagnosis: left breast mass  Post-operative Diagnosis: left breast mass  Surgeon: Hubbard Robinson   Assistants: none  Anesthesia: General endotracheal anesthesia  ASA Class: 2  Procedure Details  The patient was seen in the Holding Room. The risks, benefits, complications, treatment options, and expected outcomes were discussed with the patient. The possibilities of reaction to medication, pulmonary aspiration, bleeding, infection, the need for additional procedures, failure to diagnose a condition, and creating a complication requiring transfusion or operation were discussed with the patient. The patient concurred with the proposed plan, giving informed consent. The site of surgery properly noted/marked. The patient was taken to Operating Room, identified as Donna Riley, and the procedure verified as lumpectomy. A Time Out was held and the above information confirmed.  After induction of anesthesia, the left breast and chest were prepped and draped in standard fashion. The lumpectomy was performed by creating an curvilinear incision over the superior portion of the nipple areolar complex of the breast and following along the wire localization.  A lacrimal probe was also placed inside the draining duct and it localized to the same area as the wire.  Electrocautery was used to dissect around the specimen and wire.  The wire fell out during removal of the specimen.  Orientation sutures were placed, two long lateral, one long deep and short superior, and hemostasis was achieved with cautery.  The wound was irrigated and closed with a 4-0 Monocryl subcuticular closure in layers.    Sterile glue was applied. At the end of the  operation, all sponge, instrument, and needle counts were correct.  Findings: grossly clear surgical margins, specimen sent to pathology and clip not seen, additional tissue from around wire was sent without the clip, additional superior tissue was sent in an area that was superficial but clip no in place, pathology, Dr. Reuel Derby called to report area of papilloma seen in specimen but no clip  Estimated Blood Loss:  less than 50 mL         Drains: none         Total IV Fluids: 1031mL         Specimens: breast mass             Complications:  None; patient tolerated the procedure well.         Disposition: PACU - hemodynamically stable.         Condition: Stable

## 2016-04-05 NOTE — Anesthesia Postprocedure Evaluation (Signed)
Anesthesia Post Note  Patient: Donna Riley  Procedure(s) Performed: Procedure(s) (LRB): BREAST LUMPECTOMY WITH NEEDLE LOCALIZATION (Left)  Patient location during evaluation: PACU Anesthesia Type: General Level of consciousness: awake Pain management: pain level controlled Vital Signs Assessment: post-procedure vital signs reviewed and stable Respiratory status: spontaneous breathing Cardiovascular status: stable Anesthetic complications: no    Last Vitals:  Vitals:   04/05/16 1425 04/05/16 1433  BP:  (!) 141/69  Pulse: 64 61  Resp: 18 10  Temp:      Last Pain:  Vitals:   04/05/16 1433  TempSrc:   PainSc: 4                  VAN STAVEREN,Kenyona Rena

## 2016-04-05 NOTE — Anesthesia Procedure Notes (Signed)
Performed by: Elinor Kleine       

## 2016-04-05 NOTE — Discharge Instructions (Signed)

## 2016-04-05 NOTE — Transfer of Care (Signed)
Immediate Anesthesia Transfer of Care Note  Patient: Donna Riley  Procedure(s) Performed: Procedure(s): BREAST LUMPECTOMY WITH NEEDLE LOCALIZATION (Left)  Patient Location: PACU  Anesthesia Type:General  Level of Consciousness: awake  Airway & Oxygen Therapy: Patient Spontanous Breathing and Patient connected to face mask oxygen  Post-op Assessment: Report given to RN and Post -op Vital signs reviewed and stable  Post vital signs: Reviewed and stable  Last Vitals:  Vitals:   04/05/16 0907 04/05/16 1403  BP: (!) 161/85 128/66  Pulse: 63 68  Resp: 16 17  Temp: (!) 35.7 C 36.9 C    Last Pain:  Vitals:   04/05/16 0907  TempSrc: Tympanic         Complications: No apparent anesthesia complications

## 2016-04-05 NOTE — Anesthesia Procedure Notes (Signed)
Date/Time: 04/05/2016 11:45 AM Performed by: Allean Found Pre-anesthesia Checklist: Patient identified, Emergency Drugs available, Suction available and Patient being monitored Patient Re-evaluated:Patient Re-evaluated prior to inductionOxygen Delivery Method: Circle system utilized Preoxygenation: Pre-oxygenation with 100% oxygen Intubation Type: IV induction Ventilation: Mask ventilation without difficulty Laryngoscope Size: Mac and 3 Grade View: Grade I Tube type: Oral Tube size: 7.0 mm Number of attempts: 1 Airway Equipment and Method: Stylet Placement Confirmation: ETT inserted through vocal cords under direct vision,  positive ETCO2 and breath sounds checked- equal and bilateral Secured at: 21 cm Dental Injury: Teeth and Oropharynx as per pre-operative assessment  Comments: Very poor dentition, broken fragile left front tooth, right front tooth appears broken horizontally

## 2016-04-05 NOTE — Anesthesia Preprocedure Evaluation (Signed)
Anesthesia Evaluation  Patient identified by MRN, date of birth, ID band Patient awake    Reviewed: Allergy & Precautions, NPO status , Patient's Chart, lab work & pertinent test results  Airway Mallampati: III  TM Distance: >3 FB     Dental  (+) Chipped   Pulmonary shortness of breath and with exertion, Current Smoker,    Pulmonary exam normal        Cardiovascular hypertension, Pt. on medications Normal cardiovascular exam     Neuro/Psych Peripheral neuropathy  Neuromuscular disease negative psych ROS   GI/Hepatic IBS Denies reflux   Endo/Other  diabetes, Well Controlled, Type 2  Renal/GU   negative genitourinary   Musculoskeletal  (+) Arthritis , Osteoarthritis,  Cavernous foot deformity Hx   Abdominal Normal abdominal exam  (+)   Peds negative pediatric ROS (+)  Hematology   Anesthesia Other Findings Past Medical History: No date: Arthritis     Comment: shoulders and neck 2016: Breast cancer ([place])     Comment:  Left breast with chemo + rad tx's with               lumpectomy. 06/2014: Diabetes mellitus without complication (HCC) No date: Elevated LFTs No date: Hypertension No date: Personal history of chemotherapy No date: Personal history of radiation therapy No date: Positive PPD, treated No date: Shortness of breath dyspnea     Comment: with exertion  Reproductive/Obstetrics                             Anesthesia Physical Anesthesia Plan  ASA: II  Anesthesia Plan: General   Post-op Pain Management:    Induction: Intravenous  Airway Management Planned: LMA and Oral ETT  Additional Equipment:   Intra-op Plan:   Post-operative Plan: Extubation in OR  Informed Consent: I have reviewed the patients History and Physical, chart, labs and discussed the procedure including the risks, benefits and alternatives for the proposed anesthesia with the patient or authorized  representative who has indicated his/her understanding and acceptance.   Dental advisory given  Plan Discussed with: CRNA and Surgeon  Anesthesia Plan Comments:         Anesthesia Quick Evaluation

## 2016-04-05 NOTE — H&P (View-Only) (Signed)
Surgical H&P  03/11/2016  Donna Riley is an 55 y.o. 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  HPI: Patient is well known to the surgical service as she has a prior lumpectomy back in March 2016 with Dr. 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  I saw her back in March 2017 for her follow-up visit from this and she still had some burns from her radiation. She was starting to have some nipple discharge at that time as well as pain in the area of scar. I had her return today for her regular 6 month follow-up however in the meantime she started to have increased tenderness in the area felt a mass and had nipple discharge. She has gone for a repeat mammogram which was BI-RADS for for a 1.5 5.7 cm area in the left retroareolar region and was biopsy proven to be an intraductal papilloma. The patient states that she's continued to have some pain in this area as well as some discharge that is looked either milky or yellow in color. She has not had any fever chills or any swelling in the area. She has noted the lump underneath that area which is not enlarged. Otherwise her pain and the redness and burning sensation has improved.    Past Medical History:  Diagnosis Date  . Arthritis    shoulders and neck  . Breast cancer ([place]) 2016    Left breast with chemo + rad tx's with lumpectomy.  . Diabetes mellitus without complication ([place]) [id number]  . Elevated LFTs   . Hypertension   . Personal history of chemotherapy   . Personal history of radiation therapy   . Positive PPD, treated   . Shortness of breath dyspnea    with exertion    Past Surgical History:  Procedure Laterality Date  . BREAST BIOPSY Left 07/31/2014   positive. Lumpectomy 08/27/2014 with chemo and rad  . BREAST BIOPSY Left 03/08/2016   path pending  . BREAST LUMPECTOMY WITH NEEDLE LOCALIZATION AND AXILLARY SENTINEL LYMPH NODE BX Left 08/27/14  .  CHOLECYSTECTOMY    . COLONOSCOPY WITH PROPOFOL N/A 08/28/2015   Procedure: COLONOSCOPY WITH PROPOFOL;  Surgeon: Lucilla Lame, MD;  Location: [facility];  Service: Endoscopy;  Laterality: N/A;  Diabetic oral  . ESOPHAGOGASTRODUODENOSCOPY  2014   gastritis  . POLYPECTOMY  08/28/2015   Procedure: POLYPECTOMY INTESTINAL;  Surgeon: Lucilla Lame, MD;  Location: [facility];  Service: Endoscopy;;  Rectal polyp  . TUBAL LIGATION Bilateral   . VENTRAL HERNIA REPAIR N/A 04/10/2015   Procedure: HERNIA REPAIR VENTRAL ADULT;  Surgeon: Leonie Green, MD;  Location: [facility];  Service: General;  Laterality: N/A;    Family History  Problem Relation Age of Onset  . Cancer Mother     Pancreatic  . Cancer Father     Throat  . Cancer Maternal Grandmother     Breast  . Breast cancer Maternal Grandmother 75    Social History:  reports that she has been smoking Cigarettes.  She has a 38.00 pack-year smoking history. She has never used smokeless tobacco. She reports that she drinks alcohol. She reports that she does not use drugs.  Allergies: No Known Allergies  Medications reviewed.  Review of Systems  Constitutional: Negative for chills, fever, malaise/fatigue and weight loss.  HENT: Negative for congestion.   Respiratory: Negative for cough, hemoptysis, sputum production, shortness of breath and wheezing.  Cardiovascular: Negative for chest pain, palpitations, claudication and leg swelling.  Gastrointestinal: Negative for abdominal pain, nausea and vomiting.  Genitourinary: Negative for dysuria and frequency.  Musculoskeletal: Negative for back pain.  Skin: Negative for itching and rash.  Neurological: Negative for dizziness, loss of consciousness, weakness and headaches.  Psychiatric/Behavioral: The patient is not nervous/anxious.   All other systems reviewed and are negative.    Physical Exam:  BP (!) 172/92 (BP Location: Right Arm, Patient Position: Sitting)   Pulse  92   Temp 98.4 F (36.9 C) (Oral)   Ht 5\' 7"  (1.702 m)   Wt 205 lb (93 kg)   BMI 32.11 kg/m   Physical Exam  Constitutional: She is oriented to person, place, and time. She appears well-developed and well-nourished. No distress.  HENT:  Head: Normocephalic and atraumatic.  Right Ear: External ear normal.  Left Ear: External ear normal.  Nose: Nose normal.  Mouth/Throat: Oropharynx is clear and moist. No oropharyngeal exudate.  Eyes: Conjunctivae and EOM are normal. Pupils are equal, round, and reactive to light. No scleral icterus.  Neck: Normal range of motion. Neck supple. No tracheal deviation present.  Cardiovascular: Normal rate, regular rhythm, normal heart sounds and intact distal pulses.  Exam reveals no gallop and no friction rub.   No murmur heard. Pulmonary/Chest: Effort normal and breath sounds normal. No respiratory distress. She has no wheezes. She has no rales.  BreastExam Left breast: 1cm oblong retroareolar rubbery mobile mass, tender to palpation, serous nipple discharge with manipulation of this area, no further masses or skin changes, no lymphadenopathy  Right breast: normal skin, no changes, no palpable masses, no nipple discharge or retraction, no lymphadenopathy  Abdominal: Soft. Bowel sounds are normal. She exhibits no distension. There is no tenderness. There is no rebound and no guarding.  Musculoskeletal: Normal range of motion. She exhibits no edema, tenderness or deformity.  Neurological: She is alert and oriented to person, place, and time. No cranial nerve deficit.  Skin: Skin is warm and dry. No rash noted. No erythema. No pallor.  Psychiatric: She has a normal mood and affect. Her behavior is normal. Judgment and thought content normal.  Vitals reviewed.    Mammogram/US 02/29/2016:  On physical exam, clear nipple discharge is expressed from the central aspect of the left nipple.  Targeted ultrasound is performed, showing an intraductal mass in  the left retroareolar region measuring 1.5 x 0.6 x 0.7 cm. Targeted ultrasound of the left axilla demonstrates normal appearing axillary lymph nodes.  IMPRESSION: Indeterminate left breast mass.  RECOMMENDATION: Ultrasound-guided biopsy of the left retroareolar mass. If biopsy results demonstrate malignancy and breast conservation is being considered, bilateral breast MRI with contrast is recommended for evaluation of extent of disease.  I have discussed the findings and recommendations with the patient. Results were also provided in writing at the conclusion of the visit. If applicable, a reminder letter will be sent to the patient regarding the next appointment.  BI-RADS CATEGORY  4: Suspicious.  Clip placement: FINDINGS: Mammographic images were obtained following ultrasound guided biopsy of left subareolar presumably intraductal breast mass. Two-view mammography demonstrates presence of spiral shaped HydroMARK, shape 4, in the subareolar left breast. Expected post biopsy changes are seen.  IMPRESSION: Successful placement of post biopsy tissue marker post ultrasound-guided core needle biopsy of left subareolar breast mass.  Final Assessment: Post Procedure Mammograms for Marker Placement      Assessment/Plan: Donna Riley is an 55 y.o. 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 Patient is well-known to me as I have previously seen her proximally 6 months before for follow-up of breast cancer. She now has a intraductal papilloma in her left breast. I have personally reviewed her images which do show a approximately 2 cm mass in the retroareolar space. This does correspond with Y can feel clinically. I discussed with her that given she had the new hydro-mark clip that was placed we would do a needle biopsy in the area. I also discussed with her the pathology of an intraductal papilloma which is not cancerous but we do take them  out to decrease the nipple drainage and because they do have a rare 10% risk of carcinoma. I did discuss with the patient that she does not need to have any lymph nodes with this as it is not currently cancerous. The patient was given opportunity to ask questions and have them answered. I discussed with her that we will make an incision and removed the area and will ensure that she has no more nipple drainage in the operating room.  I discussed the risk benefits alternatives and expected outcomes of a lumpectomy with the patient. These included bleeding, infection, positive margins, need for further resection and damage to structures in the area reduced figure. I also discussed anesthetic risks such as heart attack, stroke, blood clots in the legs are long-standing and rarely death. The patient was given opportunity to ask questions and have them answered patient is in agreement with the plan. I will schedule her for a left needle localized breast lumpectomy during the first week of November.  Ashantee Deupree L. Keauna Brasel MD General Surgeon  03/11/2016,5:02 PM

## 2016-04-06 LAB — SURGICAL PATHOLOGY

## 2016-04-11 ENCOUNTER — Telehealth: Payer: Self-pay | Admitting: *Deleted

## 2016-04-11 NOTE — Telephone Encounter (Signed)
Patient called regarding Gift Card to [employer]. She stated she had spoken to [facility] about the card.

## 2016-04-11 NOTE — Telephone Encounter (Signed)
Called to advise patient that food voucher for $50.00 dollars was approved and at the front desk.

## 2016-04-14 ENCOUNTER — Other Ambulatory Visit: Payer: Self-pay

## 2016-04-14 ENCOUNTER — Telehealth: Payer: Self-pay

## 2016-04-14 NOTE — Telephone Encounter (Signed)
Christina from [facility] called needing a referral to Dr Kem Parkinson office for a office visit on Monday Nov. 20th. NPI for Dr. Mike Gip: [id number]. For a diagnosis code C50.912. Her return call is [phone].

## 2016-04-15 ENCOUNTER — Ambulatory Visit (INDEPENDENT_AMBULATORY_CARE_PROVIDER_SITE_OTHER): Payer: Commercial Managed Care - HMO | Admitting: Surgery

## 2016-04-15 ENCOUNTER — Encounter: Payer: Self-pay | Admitting: Surgery

## 2016-04-15 VITALS — BP 154/50 | HR 91 | Temp 99.3°F | Wt 203.0 lb

## 2016-04-15 DIAGNOSIS — D242 Benign neoplasm of left breast: Secondary | ICD-10-CM

## 2016-04-15 MED ORDER — HYDROCODONE-ACETAMINOPHEN 5-325 MG PO TABS: 1.0000 | ORAL_TABLET | Freq: Four times a day (QID) | ORAL | 0 refills | Status: DC | PRN

## 2016-04-15 NOTE — Telephone Encounter (Signed)
Can you take care of this?

## 2016-04-15 NOTE — Progress Notes (Signed)
55 year old female who had a left breast intraductal papilloma after having an invasive carcinoma there 6 months prior, she just underwent excision of this area on 11/7. Patient states that she's been having some soreness in the area and a lot of tenderness however no drainage from the incision site. She states that she has been wearing her compression bra. She has not had any further drainage from the nipple.  Vitals:   04/15/16 1421  BP: (!) 154/50  Pulse: 91  Temp: 99.3 F (37.4 C)   PE:  Gen: NAD Left breast: incision c/d/i with glue in place, some area of seroma beneath but not overfilling, no erythema   A/P:  I discussed the pathology of intraductal papilloma with the patient with good margins. There is no further need for excision or any other radiation or chemotherapy as it is not cancerous. The patient will continue her regular scheduled follow-ups from the cancer standpoint. I will have her return in 2 weeks to evaluate the seroma and have her continue doing compression to the area in the meantime. She is to call the office with any questions or concerns.

## 2016-04-15 NOTE — Patient Instructions (Signed)
We will see you back in 2 weeks.    

## 2016-04-17 NOTE — Progress Notes (Signed)
[facility] day:  04/18/16   Chief Complaint: Donna Riley is a 55 y.o. female with stage IIA left breast cancer who is seen for assessment after interval left breast lumpectomy.  HPI: The patient was last seen in the medical oncology clinic on 02/22/2016.  At that time, she felt tired at the end of the day.  She was doing well on Arimidex.  She continued to have left nipple drainage.  She was scheduled to follow-up with Dr. Azalee Course.  Left sided mammogram and ultrasound on 02/29/2016 revealed a 1.5 x 0.6 x 0.7 cm intraductal mass.  She underwent left breast lumpectomy at the 11 o'clock position on 04/05/2016.  Pathology revealed revealed an intraductal papilloma with sclerosis and calcifications. There was fat necrosis with fibrosis and calcifications. Pathology was negative for atypia and malignancy  Post-operative check on 04/15/2016 revealed a seroma.  She has a follow-up appointment in 2 weeks.  She sees Dr Baruch Gouty in 6 months.  She has remained on her Arimidex.  Symptomatically, she is fatigued.  She denies any breast tenderness or concerns.   Past Medical History:  Diagnosis Date  . Arthritis    shoulders and neck  . Breast cancer ([place]) 2016    Left breast with chemo + rad tx's with lumpectomy.  . Diabetes mellitus without complication ([place]) 08/5807  . Elevated LFTs   . Hypertension   . Personal history of chemotherapy   . Personal history of radiation therapy   . Positive PPD, treated   . Shortness of breath dyspnea    with exertion    Past Surgical History:  Procedure Laterality Date  . BREAST BIOPSY Left 07/31/2014   positive. Lumpectomy 08/27/2014 with chemo and rad  . BREAST BIOPSY Left 03/08/2016   path pending  . BREAST LUMPECTOMY WITH NEEDLE LOCALIZATION Left 04/05/2016   Procedure: BREAST LUMPECTOMY WITH NEEDLE LOCALIZATION;  Surgeon: Hubbard Robinson, MD;  Location: [facility];  Service: General;  Laterality: Left;   . BREAST LUMPECTOMY WITH NEEDLE LOCALIZATION AND AXILLARY SENTINEL LYMPH NODE BX Left 08/27/14  . CHOLECYSTECTOMY    . COLONOSCOPY WITH PROPOFOL N/A 08/28/2015   Procedure: COLONOSCOPY WITH PROPOFOL;  Surgeon: Lucilla Lame, MD;  Location: [facility];  Service: Endoscopy;  Laterality: N/A;  Diabetic oral  . ESOPHAGOGASTRODUODENOSCOPY  2014   gastritis  . HERNIA REPAIR    . POLYPECTOMY  08/28/2015   Procedure: POLYPECTOMY INTESTINAL;  Surgeon: Lucilla Lame, MD;  Location: [facility];  Service: Endoscopy;;  Rectal polyp  . TUBAL LIGATION Bilateral   . VENTRAL HERNIA REPAIR N/A 04/10/2015   Procedure: HERNIA REPAIR VENTRAL ADULT;  Surgeon: Leonie Green, MD;  Location: [facility];  Service: General;  Laterality: N/A;    Family History  Problem Relation Age of Onset  . Cancer Mother     Pancreatic  . Cancer Father     Throat  . Cancer Maternal Grandmother     Breast  . Breast cancer Maternal Grandmother 75    Social History:  reports that she has been smoking Cigarettes.  She has a 19.00 pack-year smoking history. She has never used smokeless tobacco. She reports that she drinks alcohol. She reports that she does not use drugs.  The patient is alone today.  Allergies: No Known Allergies  Current Medications: Current Outpatient Prescriptions  Medication Sig Dispense Refill  . amLODipine (NORVASC) 5 MG tablet TAKE 1 TABLET EVERY DAY 90 tablet 0  .  anastrozole (ARIMIDEX) 1 MG tablet Take 1 mg by mouth daily.    Marland Kitchen aspirin EC 81 MG tablet Take 81 mg by mouth daily.    Marland Kitchen HYDROcodone-acetaminophen (NORCO/VICODIN) 5-325 MG tablet Take 1 tablet by mouth every 6 (six) hours as needed for moderate pain. 20 tablet 0  . ibuprofen (ADVIL,MOTRIN) 800 MG tablet Take 1 tablet (800 mg total) by mouth every 8 (eight) hours as needed. 30 tablet 0  . lisinopril (PRINIVIL,ZESTRIL) 40 MG tablet Take 1 tablet (40 mg total) by mouth daily. 90 tablet 3  . metFORMIN (GLUCOPHAGE) 500 MG  tablet Take 1 tablet (500 mg total) by mouth every morning. 90 tablet 1  . MICROLET LANCETS MISC     . Vitamin D, Cholecalciferol, 1000 units CAPS Take 1 tablet by mouth 1 day or 1 dose.     No current facility-administered medications for this visit.     Review of Systems:  GENERAL: Feels tired.  No fevers or sweats.  Weight down 1 pound. PERFORMANCE STATUS (ECOG): 1 HEENT: No visual changes, runny nose, sore throat, mouth sores or tenderness. Lungs: No shortness of breath or cough. No hemoptysis. Cardiac: No chest pain, palpitations, orthopnea, or PND. Breast: Lumpectomy on 04/05/2016 for persistent nipple discharge (see HPI). GI: No nausea, vomiting, constipation, melena or hematochezia.  s/p colonoscsopy 08/28/2015. GU: No urgency, frequency, dysuria, or hematuria. Musculoskeletal: No back pain. No joint pain. No muscle tenderness. Extremities: No pain or swelling. Skin: No skin changes, rashes or ulcers. Neuro:No headache, numbness or weakness, balance or coordination issues. Endocrine: Hot flashes.  No diabetes, thyroid issues or night sweats. Psych: Trouble sleeping at night.  No mood changes, depression or anxiety. Pain: No focal pain. Review of systems: All other systems reviewed and found to be negative.  Physical Exam: Blood pressure (!) 159/92, pulse 78, temperature (!) 96.1 F (35.6 C), temperature source Tympanic, resp. rate 18, weight 204 lb 9.4 oz (92.8 kg). GENERAL: Well developed, well nourished, woman sitting comfortably in the exam room in no acute distress. MENTAL STATUS: Alert and oriented to person, place and time. HEAD: Braided hair. Normocephalic, atraumatic, face symmetric, no Cushingoid features. EYES: Brown eyes. Pupils equal round and reactive to light and accomodation. No conjunctivitis or scleral icterus. ENT: Oropharynx clear without lesion. Tongue normal. Mucous membranes moist.  RESPIRATORY: Clear to auscultation without  rales, wheezes or rhonchi. CARDIOVASCULAR: Regular rate and rhythm without murmur, rub or gallop. BREASTS:  Right breast without masses, skin changes or nipple discharge.  Left breast with moderate hyperpigmentation.  Post surgical and post radiation changes with edema.  Arched incision. ABDOMEN: Ventral hernia.  Soft, non-tender, with active bowel sounds, and no hepatosplenomegaly. No masses. SKIN:  No rashes, ulcers or lesions. EXTREMITIES: No edema, no skin discoloration or tenderness. No palpable cords. LYMPH NODES: No palpable cervical, supraclavicular, axillary or inguinal adenopathy  NEUROLOGICAL: Unremarkable.  PSYCH: Appropriate.   No visits with results within 3 Day(s) from this visit.  Latest known visit with results is:  Admission on 04/05/2016, Discharged on 04/05/2016  Component Date Value Ref Range Status  . Glucose-Capillary 04/05/2016 112* 65 - 99 mg/dL Final  . SURGICAL PATHOLOGY 04/06/2016    Final                   Value:Surgical Pathology CASE: [id number] PATIENT: Leahmarie Freese Surgical Pathology Report     SPECIMEN SUBMITTED: A. Breast lesion, left, 11:00 B. Breast, left, additional tissue C. Breast, left, additional tissue  CLINICAL HISTORY:  None provided  PRE-OPERATIVE DIAGNOSIS: Left breast papilloma  POST-OPERATIVE DIAGNOSIS: Same as pre-op     DIAGNOSIS: A. BREAST, LEFT, 11 O'CLOCK; LUMPECTOMY WITH WIRE LOCALIZATION: - INTRADUCTAL PAPILLOMA WITH SCLEROSIS AND CALCIFICATIONS. - BIOPSY SITE CHANGES PRESENT; MARKER CLIP NOT IDENTIFIED. - FAT NECROSIS WITH FIBROSIS AND CALCIFICATIONS. - NEGATIVE FOR ATYPIA AND MALIGNANCY.  Comment: At the anterior-inferior margin the duct is fragmented, and there is ink on the papilloma, so complete removal cannot be confirmed histologically. Follow-up is advised.  B. BREAST, LEFT, ADDITIONAL TISSUE; EXCISION: - FAT NECROSIS WITH FIBROSIS.  C. BREAST, LEFT, ADDITIONAL TISSUE; EXCISION: - FAT  NECROSIS WITH FIBROSIS AND CALC                         IFICATIONS.   GROSS DESCRIPTION: A. Intraoperative Consultation:     Received: fresh     Specimen: left breast 11:00 lesion     Pathologic Evaluation: immediate gross evaluation of margin     Diagnosis:  IOC:     - Clip not identified (needle came out right at end of clip per surgeon)     - Lesion grossly compatible with intraductal lesion (verified left breast with surgeon)     Communicated to: Dr. Orvis Brill At 1:25 PM on 04/05/2016, Elijah Birk M.D.     Tissue submitted: not applicable  A. Labeled: patient's name and medical record number Time in fixative: 1:25 PM Cold Ischemic Time: 35 minutes Total formalin fixation time 8.5 hours Type of specimen: excision Location of specimen: left Size of specimen: 5.1 (medial-lateral) by 5.1 (anterior-posterior) by 3.5 (superior-inferior) centimeter Skin: not present Direction of compression: superior-inferior Needle localization: not present with specimen; specimen radiograph  Orientation of specimen: 1 long-deep, 2                          long-lateral, 2 short-superficial where wire was placed  Superior = blue Inferior = green Medial = yellow Lateral = orange Posterior = black Anterior/Superficial = red  Biopsy site: clip not present, focal area of hemorrhage and a dilated duct with tissue Presence/absence of discrete mass: present in duct Number of discrete masses: 1 Size(s) of masses: 0.6 x 0.6 x 0.5 cm Description of masses: papillary lesion and duct with focal hemorrhage Distance between masses/clips: not applicable Distance of mass/biopsy site/clip to surgical margins: anterior-0.1 cm, superior-1.1 cm, inferior-0.1 cm, medial-0.8 cm, deep-2.5 cm and lateral 2.9 cm Description of remainder of tissue: yellow lobulated fibrofatty with focal hemorrhage  Block summary: 1-6-entire mass in the duct with adjacent fibrous tissue and hemorrhage from medial toward  lateral respectively 7-perpendicular lateral with deep margins  B. Labeled: patient's name and medical record number Time in fixative:                          1:32 PM Cold Ischemic Time: 6 minutes Total formalin fixation time 8.5 hours Type of specimen: excision Location of specimen: left Size of specimen: 1.5 x 1.1 x 0.2 cm Skin: not present Needle localization: no Orientation of specimen: not oriented; entirely inked blue Biopsy site: not identified Presence/absence of discrete mass: absent Description of tissue: yellow lobulated fibrofatty  Block summary: 1-entirely submitted   C. Labeled: patient's name and medical record number Time in fixative:   1:53 PM Cold Ischemic Time: 23 minutes Total formalin fixation time 8 hours Type of specimen: excision Location of specimen: left Size of  specimen: 3.2 x 2.2 x 0.6 cm Skin: not present Needle localization: no Orientation of specimen: not oriented; entirely inked green Biopsy site: not present Presence/absence of discrete mass: present Number of discrete masses: 1 Size(s) of mass(es): 0.3 x 0.2 x 0.2 cm Description of mass(es): slightly calcified nodule with surro                         unding hemorrhage Distance between masses/clips: not applicable Distance of mass to surgical margins: abutting green inked margin Description of remainder of tissue: yellow lobulated fibrofatty  Block summary: 1-3-entirely submitted from one edge to the opposite end (nodule cassette 2)  Final Diagnosis performed by Bryan Lemma, MD.  Electronically signed 04/06/2016 4:59:43PM    The electronic signature indicates that the named Attending Pathologist has evaluated the specimen  Technical component performed at [facility], [address], [place], [place] [address] Lab: [phone] Dir: Darrick Penna. Evette Doffing, MD  Professional component performed at [facility], [facility], [place], [place], [place] [address] Lab:  [phone] Dir: Dellia Nims. Reuel Derby, MD    . Glucose-Capillary 04/05/2016 101* 65 - 99 mg/dL Final    Assessment:  RUFUS CYPERT is a 55 y.o. African American female with stage IIA left breast cancer status post partial mastectomy and sentinel lymph node biopsy on 08/27/2014. 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 There was lymphovascular invasion. One of 2 sentinel lymph nodes were positive (focus of 2.8 mm). Tumor was > 90% ER positive, > 90% PR positive, and Her2/neu negative. Pathologic stage was [id number].  Bone scan on 09/16/2014 revealed abnormal focal uptake at the level of right L3 pedicle and L5 vertebral body. Lumbar spine MRI on 09/27/2014 revealed no evidence of metastatic disease with lower lumbar facet arthritis. Abdominal and pelvic CT scan on 09/24/2014 revealed hepatomegaly and no evidence of metastatic disease.   Bone density study on 09/15/2014 revealed osteopenia with a T-score of -2.1 at L1-L4.  She is on calcium and vitamin D.  She received 4 cycles of Taxotere and Cytoxan (09/29/2014 - 12/09/2014) with Neulasta support. She received 50.4 Gy to the left breast from 01/12/2015 until 02/23/2015.  She was started on Femara on 03/12/2015, but switched to Arimidex on 03/30/2015 secondary to diffuse joint aches.    CA27.29 was 16.9 on 09/09/2014, 12.1 on 06/02/2015, 15.6 on 08/07/2015, 17.4 on 11/23/2015, and 13.6 on 02/22/2016.  Bilateral diagnostic mammogram on 07/30/2015 revealed no evidence of malignancy.  Left sided mammogram and ultrasound on 02/29/2016 revealed a 1.5 x 0.6 x 0.7 cm intraductal mass.    She has had chronic left nipple discharge.  Left breast lumpectomy at the 11 o'clock position on 04/05/2016 revealed an intraductal papilloma with sclerosis and calcifications. There was fat necrosis with fibrosis and calcifications. Pathology was negative for atypia and malignancy  Colonoscopy  on 08/28/2015 revealed one 4 mm polyp in the rectum (tubular adenoma).  Symptomatically, she is doing well on Arimidex.  She notes ongoing fatigue.  Exam reveals post-operative changes.  Plan: 1.  Review interval mammogram and left breast pathology.  No evidence of malignancy. 2.  Continue Arimidex.  3.  Discuss evaluation of fatigue.  Consider TSH and sleep apnea testing. 4.  RTC in 06/20/2016 for MD assessment and labs (CBC with diff, CMP, CA27.29).   Lequita Asal, MD  04/18/2016, 11:10 AM

## 2016-04-18 ENCOUNTER — Inpatient Hospital Stay: Payer: Commercial Managed Care - HMO | Attending: Hematology and Oncology | Admitting: Hematology and Oncology

## 2016-04-18 ENCOUNTER — Encounter: Payer: Self-pay | Admitting: Radiation Oncology

## 2016-04-18 ENCOUNTER — Ambulatory Visit
Admission: RE | Admit: 2016-04-18 | Discharge: 2016-04-18 | Disposition: A | Discharge status: Home / Self Care | Payer: Commercial Managed Care - HMO | Source: Ambulatory Visit | Attending: Radiation Oncology | Admitting: Radiation Oncology

## 2016-04-18 VITALS — BP 149/82 | HR 74 | Temp 96.1°F | Resp 18 | Wt 204.6 lb

## 2016-04-18 VITALS — BP 165/73 | HR 81 | Temp 98.1°F | Resp 18 | Wt 204.7 lb

## 2016-04-18 DIAGNOSIS — Z7984 Long term (current) use of oral hypoglycemic drugs: Secondary | ICD-10-CM | POA: Insufficient documentation

## 2016-04-18 DIAGNOSIS — D242 Benign neoplasm of left breast: Secondary | ICD-10-CM

## 2016-04-18 DIAGNOSIS — C50212 Malignant neoplasm of upper-inner quadrant of left female breast: Secondary | ICD-10-CM

## 2016-04-18 DIAGNOSIS — Z9221 Personal history of antineoplastic chemotherapy: Secondary | ICD-10-CM | POA: Diagnosis not present

## 2016-04-18 DIAGNOSIS — E119 Type 2 diabetes mellitus without complications: Secondary | ICD-10-CM | POA: Diagnosis not present

## 2016-04-18 DIAGNOSIS — F17210 Nicotine dependence, cigarettes, uncomplicated: Secondary | ICD-10-CM | POA: Diagnosis not present

## 2016-04-18 DIAGNOSIS — Z170 Estrogen receptor positive status [ER+]: Secondary | ICD-10-CM

## 2016-04-18 DIAGNOSIS — N6489 Other specified disorders of breast: Secondary | ICD-10-CM | POA: Insufficient documentation

## 2016-04-18 DIAGNOSIS — Z923 Personal history of irradiation: Secondary | ICD-10-CM | POA: Diagnosis not present

## 2016-04-18 DIAGNOSIS — Z7982 Long term (current) use of aspirin: Secondary | ICD-10-CM | POA: Diagnosis not present

## 2016-04-18 DIAGNOSIS — C50912 Malignant neoplasm of unspecified site of left female breast: Secondary | ICD-10-CM

## 2016-04-18 DIAGNOSIS — Z79899 Other long term (current) drug therapy: Secondary | ICD-10-CM | POA: Diagnosis not present

## 2016-04-18 DIAGNOSIS — R160 Hepatomegaly, not elsewhere classified: Secondary | ICD-10-CM | POA: Diagnosis not present

## 2016-04-18 DIAGNOSIS — M8580 Other specified disorders of bone density and structure, unspecified site: Secondary | ICD-10-CM | POA: Diagnosis not present

## 2016-04-18 DIAGNOSIS — Z853 Personal history of malignant neoplasm of breast: Secondary | ICD-10-CM | POA: Insufficient documentation

## 2016-04-18 DIAGNOSIS — I10 Essential (primary) hypertension: Secondary | ICD-10-CM | POA: Insufficient documentation

## 2016-04-18 DIAGNOSIS — Z79811 Long term (current) use of aromatase inhibitors: Secondary | ICD-10-CM | POA: Insufficient documentation

## 2016-04-18 DIAGNOSIS — R5383 Other fatigue: Secondary | ICD-10-CM

## 2016-04-18 DIAGNOSIS — K621 Rectal polyp: Secondary | ICD-10-CM | POA: Insufficient documentation

## 2016-04-18 NOTE — Progress Notes (Signed)
Patient states she sweats a lot.  She wakes up during the night but is able to go back to sleep.  BP elevated today.  159/92 HR 78.  Patient states her vs were taken in radiation this morning and her BP was 165/78.  States she has taken her BP meds this morning.

## 2016-04-18 NOTE — Progress Notes (Signed)
Radiation Oncology Follow up Note  Name: Donna Riley   Date:   04/18/2016 MRN:  [id number] DOB: 11/08/1960    This 55 y.o. female presents to the clinic today for status post follow-up for reexcision of her left breast status post whole breast radiation 1 year prior for stage IIa invasive mammary carcinoma.  REFERRING PROVIDER: Berglund, Laura H, MD  HPI: Patient is a 55-year-old female who I recently saw out a year having completed radiation therapy to her left breast for stage IIa ER/PR positive HER-2/neu negative invasive mammary carcinoma. She continues problems with drainage from her left breast and mammogram and targeted ultrasound showed a 1.5 cm lesion in the left retro-areolar region. She recently underwent reexcision. Showing intraductal papilloma with sclerosis and calcifications fat necrosis no evidence of atypia or malignancy. At this point she is healing well. She specifically denies breast tenderness cough or bone pain or any further discharge from her left breast.  COMPLICATIONS OF TREATMENT: none  FOLLOW UP COMPLIANCE: keeps appointments   PHYSICAL EXAM:  BP (!) 165/73   Pulse 81   Temp 98.1 F (36.7 C)   Resp 18   Wt 204 lb 11.2 oz (92.8 kg)   BMI 31.12 kg/m  Left breast as recent surgery with some tethering of the breast towards the scar site. No dominant mass or nodularity is noted in either breast in 2 positions examined. No axillary or supraclavicular adenopathy is identified. Well-developed well-nourished patient in NAD. HEENT reveals PERLA, EOMI, discs not visualized.  Oral cavity is clear. No oral mucosal lesions are identified. Neck is clear without evidence of cervical or supraclavicular adenopathy. Lungs are clear to A&P. Cardiac examination is essentially unremarkable with regular rate and rhythm without murmur rub or thrill. Abdomen is benign with no organomegaly or masses noted. Motor sensory and DTR levels are equal and symmetric in the upper and lower  extremities. Cranial nerves II through XII are grossly intact. Proprioception is intact. No peripheral adenopathy or edema is identified. No motor or sensory levels are noted. Crude visual fields are within normal range.  RADIOLOGY RESULTS: No current films for review Pathology reports reviewed and compatible with the above-stated findings  PLAN: At the present time am pleased was no residual malignancy in the left breast. I've asked to see her back in 6 months for follow-up. Patient knows to call sooner with any concerns.  I would like to take this opportunity to thank you for allowing me to participate in the care of your patient..    , S., MD   

## 2016-04-20 ENCOUNTER — Telehealth: Payer: Self-pay

## 2016-04-20 NOTE — Telephone Encounter (Signed)
Patient called today to say that she recently had a breast lumpectomy w/ needle Localization on 04/05/16 with Dr.loflin. Patient was seen this week in the office, however she has redness and increase in bloody drainage.  Appointment was offered today with Dr.pabon in the [facility] office but patient declined. She opted to go to the emergency department.

## 2016-04-22 ENCOUNTER — Telehealth: Payer: Self-pay | Admitting: Surgery

## 2016-04-22 NOTE — Telephone Encounter (Signed)
Received a call from the patient. Apparently incision dehisced, but she is clinically doing well. Advice to cover the wound and come to the hospital for evaluation of the wound. She stated that she was fine and that she will come to the ER if sxs worsens

## 2016-04-25 ENCOUNTER — Ambulatory Visit (INDEPENDENT_AMBULATORY_CARE_PROVIDER_SITE_OTHER): Payer: Commercial Managed Care - HMO | Admitting: Surgery

## 2016-04-25 ENCOUNTER — Encounter: Payer: Self-pay | Admitting: Surgery

## 2016-04-25 ENCOUNTER — Telehealth: Payer: Self-pay

## 2016-04-25 VITALS — BP 182/84 | HR 72 | Temp 99.6°F | Ht 68.0 in | Wt 206.0 lb

## 2016-04-25 DIAGNOSIS — D242 Benign neoplasm of left breast: Secondary | ICD-10-CM

## 2016-04-25 DIAGNOSIS — F17209 Nicotine dependence, unspecified, with unspecified nicotine-induced disorders: Secondary | ICD-10-CM

## 2016-04-25 DIAGNOSIS — C50912 Malignant neoplasm of unspecified site of left female breast: Secondary | ICD-10-CM

## 2016-04-25 DIAGNOSIS — Z716 Tobacco abuse counseling: Secondary | ICD-10-CM

## 2016-04-25 MED ORDER — SULFAMETHOXAZOLE-TRIMETHOPRIM 800-160 MG PO TABS: 1.0000 | ORAL_TABLET | Freq: Two times a day (BID) | ORAL | 0 refills | Status: DC

## 2016-04-25 MED ORDER — NICOTINE 14 MG/24HR TD PT24: 14.0000 mg | MEDICATED_PATCH | TRANSDERMAL | 0 refills | Status: DC

## 2016-04-25 MED ORDER — HYDROCODONE-ACETAMINOPHEN 5-325 MG PO TABS: 1.0000 | ORAL_TABLET | Freq: Four times a day (QID) | ORAL | 0 refills | Status: DC | PRN

## 2016-04-25 MED ORDER — BUPROPION HCL 75 MG PO TABS: 150.0000 mg | ORAL_TABLET | Freq: Two times a day (BID) | ORAL | 3 refills | Status: DC

## 2016-04-25 NOTE — Progress Notes (Signed)
55 year old female who had a left breast lumpectomy  for a papilloma on 11/7. The patient had somewhat of a seroma at last visit but wasn't tenting or causing increased pain redness or any drainage from the incision site. Patient states it continued to hurt and then opened up this past week. Patient has been placing dry dressings on the area and has been draining a clear to yellow liquidy drainage. Patient has not had any erythema or any purulent drainage from the area. Patient denies any fevers or chills as well. The patient has continued to smoke about a half a pack a day. The patient also has had radiation to the left breast previously and did have a lot of blistering and issues from this, however her breast with healed prior to doing the operation.  Vitals:   04/25/16 1505  BP: (!) 182/84  Pulse: 72  Temp: 99.6 F (37.6 C)   PE:  Gen: NAD Left breast: periareolar incision opened about 1cm with some fibrinous material and serous drainage, no erythema or induration   A/P:  Patient had dehiscence of her incision site likely due to a combination of smoking and radiation to the area. I have discussed with the patient that if we can get her to stop smoking we can potentially heal the area quicker. We discussed smoking cessation techniques and the patient is willing to quit. The patient would like to try Wellbutrin and the nicotine patches to try and stop smoking. I have prescribed dose today. I will prescribe her some more Vicodin to help with the pain in the breast and some Bactrim to ensure she doesn't get an infection in the area. I'll have her follow-up with one of my partners next week to ensure that the wound is healing.

## 2016-04-25 NOTE — Telephone Encounter (Signed)
Patient came in to the office this morning wanting for me to look at her incision site. She stated that her incision site had "opened up". I told her that I would look at it but if she needed to be seen today that she would have to go to our [place] office, she agreed. I then brought patient to a room and looked at her incision site. She definitely had her incision dehisced. It felt warm at touch but patient had not experienced any fever. The incision site is currently draining a light green fluid and patient stated that it was painful to touch. I then called the [place] office and spoke to [employer] about it. Dr. Azalee Course will see her at 2:30 PM to day. Patient was told about the appointment and agreed to go. She asked me what would be done today. I told her that she could prescribe her some antibiotic and clean her wound. Patient had no further questions.

## 2016-04-25 NOTE — Patient Instructions (Signed)
Please apply dry dressing twice daily.  We have called your Antibiotic to the drug store. Please see your follow up appointment next week listed below. Please call our office if you have questions or concerns.

## 2016-04-28 ENCOUNTER — Ambulatory Visit: Payer: Self-pay | Admitting: Surgery

## 2016-05-02 ENCOUNTER — Ambulatory Visit (INDEPENDENT_AMBULATORY_CARE_PROVIDER_SITE_OTHER): Payer: Commercial Managed Care - HMO | Admitting: General Surgery

## 2016-05-02 ENCOUNTER — Encounter: Payer: Self-pay | Admitting: General Surgery

## 2016-05-02 VITALS — BP 176/85 | HR 76 | Temp 98.4°F | Ht 68.0 in | Wt 202.0 lb

## 2016-05-02 DIAGNOSIS — Z4889 Encounter for other specified surgical aftercare: Secondary | ICD-10-CM

## 2016-05-02 MED ORDER — HYDROCODONE-ACETAMINOPHEN 5-325 MG PO TABS: 1.0000 | ORAL_TABLET | ORAL | 0 refills | Status: DC | PRN

## 2016-05-02 NOTE — Progress Notes (Signed)
Outpatient Surgical Follow Up  05/02/2016  Donna Riley is an 55 y.o. female.   Chief Complaint  Patient presents with  . Routine Post Op    Left Breast Lumpectomy (04/05/16)- Dr. Azalee Course    HPI: 55 year old female returns to clinic for wound check status post left breast lumpectomy 1 month ago. Per the patient and report the wound completely opened up. Patient reports that appears to be improving but continues to have drainage. Her pain is controlled with as needed pain medication but states she is currently out. She denies any fevers, chills, nausea, vomiting, chest pain, shortness of breath, diarrhea, constipation.  Past Medical History:  Diagnosis Date  . Arthritis    shoulders and neck  . Breast cancer ([place]) 2016    Left breast with chemo + rad tx's with lumpectomy.  . Diabetes mellitus without complication ([place]) Q000111Q  . Elevated LFTs   . Hypertension   . Personal history of chemotherapy   . Personal history of radiation therapy   . Positive PPD, treated   . Shortness of breath dyspnea    with exertion    Past Surgical History:  Procedure Laterality Date  . BREAST BIOPSY Left 07/31/2014   positive. Lumpectomy 08/27/2014 with chemo and rad  . BREAST BIOPSY Left 03/08/2016   path pending  . BREAST LUMPECTOMY WITH NEEDLE LOCALIZATION Left 04/05/2016   Procedure: BREAST LUMPECTOMY WITH NEEDLE LOCALIZATION;  Surgeon: Hubbard Robinson, MD;  Location: [facility];  Service: General;  Laterality: Left;  . BREAST LUMPECTOMY WITH NEEDLE LOCALIZATION AND AXILLARY SENTINEL LYMPH NODE BX Left 08/27/14  . CHOLECYSTECTOMY    . COLONOSCOPY WITH PROPOFOL N/A 08/28/2015   Procedure: COLONOSCOPY WITH PROPOFOL;  Surgeon: Lucilla Lame, MD;  Location: [facility];  Service: Endoscopy;  Laterality: N/A;  Diabetic oral  . ESOPHAGOGASTRODUODENOSCOPY  2014   gastritis  . HERNIA REPAIR    . POLYPECTOMY  08/28/2015   Procedure: POLYPECTOMY INTESTINAL;  Surgeon: Lucilla Lame, MD;  Location:  [facility];  Service: Endoscopy;;  Rectal polyp  . TUBAL LIGATION Bilateral   . VENTRAL HERNIA REPAIR N/A 04/10/2015   Procedure: HERNIA REPAIR VENTRAL ADULT;  Surgeon: Leonie Green, MD;  Location: [facility];  Service: General;  Laterality: N/A;    Family History  Problem Relation Age of Onset  . Cancer Mother     Pancreatic  . Cancer Father     Throat  . Cancer Maternal Grandmother     Breast  . Breast cancer Maternal Grandmother 75    Social History:  reports that she has been smoking Cigarettes.  She has a 19.00 pack-year smoking history. She has never used smokeless tobacco. She reports that she drinks alcohol. She reports that she does not use drugs.  Allergies: No Known Allergies  Medications reviewed.    ROS A multipoint review of systems was completed, all pertinent positives and negatives are documented within the history of present illness and remainder are negative.   BP (!) 176/85   Pulse 76   Temp 98.4 F (36.9 C) (Oral)   Ht 5\' 8"  (1.727 m)   Wt 91.6 kg (202 lb)   BMI 30.71 kg/m   Physical Exam Gen.: No acute distress Neck: Supple and nontender Breast: Left breast examined with surgical site widely open. Healthy-appearing granulation tissue within the wound. The wound is very deep but there is no evidence of infection. It is appropriately tender to palpation. Chest: Clear to auscultation Heart: Regular  rhythm Abdomen: Soft and nontender.    No results found for this or any previous visit (from the past 48 hour(s)). No results found.  Assessment/Plan:  1. Aftercare following surgery 55 year old female status post left breast lumpectomy. Has had total wound disruption. Has healthy-appearing granulation tissue does appear to be healing. Counseled patient to complete her antibiotics. She is to continue local wound care. She'll follow-up in clinic in 2 weeks for additional wound check by her operative surgeon. Discussed that if her  bandages are sticking to her skin she can use an antimicrobial ointment to help a: keep it from sticking and b: prevent infection. She voiced understanding and will follow-up sooner she thinks is getting worse otherwise she'll be seen in 2 weeks.     Clayburn Pert, MD FACS General Surgeon  05/02/2016,3:33 PM

## 2016-05-02 NOTE — Patient Instructions (Signed)
Please give [place] a call if you have any signs of infection. Please finish your antibiotics. Remember to change your dressing twice a day.

## 2016-05-18 ENCOUNTER — Encounter: Payer: Self-pay | Admitting: Surgery

## 2016-05-18 ENCOUNTER — Ambulatory Visit (INDEPENDENT_AMBULATORY_CARE_PROVIDER_SITE_OTHER): Payer: Commercial Managed Care - HMO | Admitting: Surgery

## 2016-05-18 VITALS — BP 151/93 | HR 90 | Temp 98.3°F | Wt 200.0 lb

## 2016-05-18 DIAGNOSIS — Z170 Estrogen receptor positive status [ER+]: Secondary | ICD-10-CM

## 2016-05-18 DIAGNOSIS — C50912 Malignant neoplasm of unspecified site of left female breast: Secondary | ICD-10-CM

## 2016-05-18 MED ORDER — GABAPENTIN 300 MG PO CAPS: 300.0000 mg | ORAL_CAPSULE | Freq: Three times a day (TID) | ORAL | 1 refills | Status: DC

## 2016-05-18 MED ORDER — HYDROCODONE-ACETAMINOPHEN 5-325 MG PO TABS: 1.0000 | ORAL_TABLET | Freq: Four times a day (QID) | ORAL | 0 refills | Status: DC | PRN

## 2016-05-18 NOTE — Progress Notes (Signed)
Outpatient Surgical Follow Up  05/18/2016  Donna Riley is an 55 y.o. female seen for the diagnosis of Malignant neoplasm of left breast in female, estrogen receptor positive, unspecified site of breast ([place]) [C50.912, Z17.0].  HPI: She had a lumpectomy of the left breast papilloma a few weeks prior which opened up and drained. The area is tender and having some nervelike pains in the area that are stabbing electrical pains. The area is still draining some yellowish drainage. She has not had any fevers or chills and has not been on antibiotics for over a week. The patient has decreased her smoking to about 3 cigarettes a day and is continuing to work on that.  Past Medical History:  Diagnosis Date  . Arthritis    shoulders and neck  . Breast cancer ([place]) 2016    Left breast with chemo + rad tx's with lumpectomy.  . Diabetes mellitus without complication ([place]) Q000111Q  . Elevated LFTs   . Hypertension   . Personal history of chemotherapy   . Personal history of radiation therapy   . Positive PPD, treated   . Shortness of breath dyspnea    with exertion    Past Surgical History:  Procedure Laterality Date  . BREAST BIOPSY Left 07/31/2014   positive. Lumpectomy 08/27/2014 with chemo and rad  . BREAST BIOPSY Left 03/08/2016   path pending  . BREAST LUMPECTOMY WITH NEEDLE LOCALIZATION Left 04/05/2016   Procedure: BREAST LUMPECTOMY WITH NEEDLE LOCALIZATION;  Surgeon: Hubbard Robinson, MD;  Location: [facility];  Service: General;  Laterality: Left;  . BREAST LUMPECTOMY WITH NEEDLE LOCALIZATION AND AXILLARY SENTINEL LYMPH NODE BX Left 08/27/14  . CHOLECYSTECTOMY    . COLONOSCOPY WITH PROPOFOL N/A 08/28/2015   Procedure: COLONOSCOPY WITH PROPOFOL;  Surgeon: Lucilla Lame, MD;  Location: [facility];  Service: Endoscopy;  Laterality: N/A;  Diabetic oral  . ESOPHAGOGASTRODUODENOSCOPY  2014   gastritis  . HERNIA REPAIR    . POLYPECTOMY  08/28/2015   Procedure: POLYPECTOMY INTESTINAL;   Surgeon: Lucilla Lame, MD;  Location: [facility];  Service: Endoscopy;;  Rectal polyp  . TUBAL LIGATION Bilateral   . VENTRAL HERNIA REPAIR N/A 04/10/2015   Procedure: HERNIA REPAIR VENTRAL ADULT;  Surgeon: Leonie Green, MD;  Location: [facility];  Service: General;  Laterality: N/A;    Family History  Problem Relation Age of Onset  . Cancer Mother     Pancreatic  . Cancer Father     Throat  . Cancer Maternal Grandmother     Breast  . Breast cancer Maternal Grandmother 75    Social History:  reports that she has been smoking Cigarettes.  She has a 9.50 pack-year smoking history. She has never used smokeless tobacco. She reports that she drinks alcohol. She reports that she does not use drugs.  Allergies: No Known Allergies  Medications reviewed.  Physical Exam:  BP (!) 151/93   Pulse 90   Temp 98.3 F (36.8 C) (Oral)   Wt 200 lb (90.7 kg)   BMI 30.41 kg/m   Gen: patient resting comfortably in clinic, no cardiovascular or respiratory distress Breast: 1/2 cm opening that goes about 1-1/2 cm deep as well with good granulation tissue beneath no fibrinous material mild serous drainage no erythema   No results found for this or any previous visit (from the past 48 hour(s)). No results found.  Assessment/Plan: Donna Riley is an 55 y.o. female seen for the diagnosis of Malignant neoplasm of  left breast in female, estrogen receptor positive, unspecified site of breast ([place]) [C50.912, Z17.0].  Patient is healing well with dry dressings to the area. We discussed her smoking cessation again and she is working on this and she is continued on the Wellbutrin. I will give her some Neurontin for the nervelike pain and refilled her Norco today as well. She is taking a stool softener to help with constipation too. I will have her return in 2-3 weeks to follow up with one of my partners to ensure continued healing in the area.  Emmalee Solivan L. Makinzy Cleere MD General  Surgeon  05/18/2016,3:23 PM

## 2016-05-18 NOTE — Patient Instructions (Addendum)
Please continue cahnging your dressing twice a day.  We will see you back in three weeks.   Please give [place] a call if you have any questions or concerns.

## 2016-05-21 ENCOUNTER — Other Ambulatory Visit: Payer: Self-pay | Admitting: Nurse Practitioner

## 2016-05-25 ENCOUNTER — Encounter: Payer: Self-pay | Admitting: Hematology and Oncology

## 2016-06-08 ENCOUNTER — Encounter: Payer: Self-pay | Admitting: General Surgery

## 2016-06-08 ENCOUNTER — Ambulatory Visit (INDEPENDENT_AMBULATORY_CARE_PROVIDER_SITE_OTHER): Payer: Medicare HMO | Admitting: General Surgery

## 2016-06-08 VITALS — BP 156/89 | HR 109 | Temp 98.2°F | Ht 68.0 in | Wt 204.6 lb

## 2016-06-08 DIAGNOSIS — Z4889 Encounter for other specified surgical aftercare: Secondary | ICD-10-CM

## 2016-06-08 MED ORDER — HYDROCODONE-ACETAMINOPHEN 5-325 MG PO TABS: 1.0000 | ORAL_TABLET | Freq: Every evening | ORAL | 0 refills | Status: DC | PRN

## 2016-06-08 NOTE — Progress Notes (Signed)
Outpatient Surgical Follow Up  06/08/2016  Donna Riley is an 56 y.o. female.   Chief Complaint  Patient presents with  . Follow-up    Left Breast Wound    HPI: 56 year old female returns to clinic for additional follow-up of left breast wound secondary to lumpectomy. States it is still causing significant discomfort especially when she tries to sleep. She is still requiring pain medications to help sleep. She also complains of tenderness and discomfort at her umbilicus which she states is from a hernia. She denies any current fevers, chills, nausea, vomiting, chest pain, shortness of breath, diarrhea, constipation.  Past Medical History:  Diagnosis Date  . Arthritis    shoulders and neck  . Breast cancer ([place]) 2016    Left breast with chemo + rad tx's with lumpectomy.  . Diabetes mellitus without complication ([place]) Q000111Q  . Elevated LFTs   . Hypertension   . Personal history of chemotherapy   . Personal history of radiation therapy   . Positive PPD, treated   . Shortness of breath dyspnea    with exertion    Past Surgical History:  Procedure Laterality Date  . BREAST BIOPSY Left 07/31/2014   positive. Lumpectomy 08/27/2014 with chemo and rad  . BREAST BIOPSY Left 03/08/2016   path pending  . BREAST LUMPECTOMY WITH NEEDLE LOCALIZATION Left 04/05/2016   Procedure: BREAST LUMPECTOMY WITH NEEDLE LOCALIZATION;  Surgeon: Hubbard Robinson, MD;  Location: [facility];  Service: General;  Laterality: Left;  . BREAST LUMPECTOMY WITH NEEDLE LOCALIZATION AND AXILLARY SENTINEL LYMPH NODE BX Left 08/27/14  . CHOLECYSTECTOMY    . COLONOSCOPY WITH PROPOFOL N/A 08/28/2015   Procedure: COLONOSCOPY WITH PROPOFOL;  Surgeon: Lucilla Lame, MD;  Location: [facility];  Service: Endoscopy;  Laterality: N/A;  Diabetic oral  . ESOPHAGOGASTRODUODENOSCOPY  2014   gastritis  . HERNIA REPAIR    . POLYPECTOMY  08/28/2015   Procedure: POLYPECTOMY INTESTINAL;  Surgeon: Lucilla Lame, MD;  Location:  [facility];  Service: Endoscopy;;  Rectal polyp  . TUBAL LIGATION Bilateral   . VENTRAL HERNIA REPAIR N/A 04/10/2015   Procedure: HERNIA REPAIR VENTRAL ADULT;  Surgeon: Leonie Green, MD;  Location: [facility];  Service: General;  Laterality: N/A;    Family History  Problem Relation Age of Onset  . Cancer Mother     Pancreatic  . Cancer Father     Throat  . Cancer Maternal Grandmother     Breast  . Breast cancer Maternal Grandmother 75    Social History:  reports that she has been smoking Cigarettes.  She has a 9.50 pack-year smoking history. She has never used smokeless tobacco. She reports that she drinks alcohol. She reports that she does not use drugs.  Allergies: No Known Allergies  Medications reviewed.    ROS A multipoint review of systems was completed. All pertinent positives and negatives are documented within the history of present illness and remainder are negative.   BP (!) 156/89   Pulse (!) 109   Temp 98.2 F (36.8 C) (Oral)   Ht 5\' 8"  (1.727 m)   Wt 92.8 kg (204 lb 9.6 oz)   BMI 31.11 kg/m   Physical Exam Gen.: No acute distress Neck: Supple and nontender Chest: Clear to sedation Breast: Left breast examined with prior lumpectomy site still with a lateral aspect that is open with healthy-appearing granulation tissue. There is no evidence of purulence or spreading erythema currently. Heart: Tachycardic Abdomen: Soft, minimally tender to  palpation at her umbilicus, nondistended.    No results found for this or any previous visit (from the past 48 hour(s)). No results found.  Assessment/Plan:  1. Aftercare following surgery 56 year old female with continued follow-up for left breast lumpectomy wound disruption. Does appear to be healing but slowly. Again counseled as to the importance of smoking cessation. Provided with a prescription for 1 pill of pain medication per night until she can be seen by Dr. Azalee Course in clinic. Discussed that we  would not address her umbilicus until her breast wound was healed. Patient voiced understanding and will follow-up in clinic in 2 weeks with her operative surgeon.     Clayburn Pert, MD FACS General Surgeon  06/08/2016,10:12 AM

## 2016-06-08 NOTE — Patient Instructions (Addendum)
Please follow-up with Dr. Azalee Course as scheduled below.  We will give you enough pain medication to get through until your next appointment. If you feel like this is needing to be refilled you will need to be seen first.

## 2016-06-20 ENCOUNTER — Inpatient Hospital Stay (HOSPITAL_BASED_OUTPATIENT_CLINIC_OR_DEPARTMENT_OTHER): Payer: Medicare HMO | Admitting: Hematology and Oncology

## 2016-06-20 ENCOUNTER — Encounter: Payer: Self-pay | Admitting: Hematology and Oncology

## 2016-06-20 ENCOUNTER — Inpatient Hospital Stay: Payer: Medicare HMO | Attending: Hematology and Oncology

## 2016-06-20 VITALS — BP 150/80 | HR 76 | Temp 96.3°F | Resp 18 | Wt 205.5 lb

## 2016-06-20 DIAGNOSIS — Z79811 Long term (current) use of aromatase inhibitors: Secondary | ICD-10-CM

## 2016-06-20 DIAGNOSIS — M8580 Other specified disorders of bone density and structure, unspecified site: Secondary | ICD-10-CM | POA: Diagnosis not present

## 2016-06-20 DIAGNOSIS — R5383 Other fatigue: Secondary | ICD-10-CM

## 2016-06-20 DIAGNOSIS — F17210 Nicotine dependence, cigarettes, uncomplicated: Secondary | ICD-10-CM

## 2016-06-20 DIAGNOSIS — Z7982 Long term (current) use of aspirin: Secondary | ICD-10-CM | POA: Insufficient documentation

## 2016-06-20 DIAGNOSIS — R69 Illness, unspecified: Secondary | ICD-10-CM | POA: Diagnosis not present

## 2016-06-20 DIAGNOSIS — I10 Essential (primary) hypertension: Secondary | ICD-10-CM | POA: Diagnosis not present

## 2016-06-20 DIAGNOSIS — Z170 Estrogen receptor positive status [ER+]: Secondary | ICD-10-CM | POA: Diagnosis not present

## 2016-06-20 DIAGNOSIS — Z923 Personal history of irradiation: Secondary | ICD-10-CM | POA: Diagnosis not present

## 2016-06-20 DIAGNOSIS — C50912 Malignant neoplasm of unspecified site of left female breast: Secondary | ICD-10-CM | POA: Insufficient documentation

## 2016-06-20 DIAGNOSIS — Z9221 Personal history of antineoplastic chemotherapy: Secondary | ICD-10-CM | POA: Insufficient documentation

## 2016-06-20 LAB — COMPREHENSIVE METABOLIC PANEL
ALT: 22 U/L (ref 14–54)
AST: 22 U/L (ref 15–41)
Albumin: 4 g/dL (ref 3.5–5.0)
Alkaline Phosphatase: 95 U/L (ref 38–126)
Anion gap: 6 (ref 5–15)
BUN: 11 mg/dL (ref 6–20)
CO2: 25 mmol/L (ref 22–32)
Calcium: 9.1 mg/dL (ref 8.9–10.3)
Chloride: 108 mmol/L (ref 101–111)
Creatinine, Ser: 0.77 mg/dL (ref 0.44–1.00)
GFR calc Af Amer: >60 mL/min (ref >60)
GFR calc non Af Amer: >60 mL/min (ref >60)
Glucose, Bld: 133 mg/dL — ABNORMAL HIGH (ref 65–99)
Potassium: 3.5 mmol/L (ref 3.5–5.1)
Sodium: 139 mmol/L (ref 135–145)
Total Bilirubin: 0.2 mg/dL — ABNORMAL LOW (ref 0.3–1.2)
Total Protein: 7.5 g/dL (ref 6.5–8.1)

## 2016-06-20 LAB — CBC WITH DIFFERENTIAL/PLATELET
Basophils Absolute: 0.1 10*3/uL (ref 0–0.1)
Basophils Relative: 1 %
Eosinophils Absolute: 0.1 10*3/uL (ref 0–0.7)
Eosinophils Relative: 2 %
HCT: 39.8 % (ref 35.0–47.0)
Hemoglobin: 13.1 g/dL (ref 12.0–16.0)
Lymphocytes Relative: 30 %
Lymphs Abs: 2.1 10*3/uL (ref 1.0–3.6)
MCH: 26.9 pg (ref 26.0–34.0)
MCHC: 33 g/dL (ref 32.0–36.0)
MCV: 81.7 fL (ref 80.0–100.0)
Monocytes Absolute: 0.3 10*3/uL (ref 0.2–0.9)
Monocytes Relative: 4 %
Neutro Abs: 4.4 10*3/uL (ref 1.4–6.5)
Neutrophils Relative %: 63 %
Platelets: 230 10*3/uL (ref 150–440)
RBC: 4.87 MIL/uL (ref 3.80–5.20)
RDW: 14.3 % (ref 11.5–14.5)
WBC: 6.9 10*3/uL (ref 3.6–11.0)

## 2016-06-20 LAB — TSH: TSH: 1.774 u[IU]/mL (ref 0.350–4.500)

## 2016-06-20 NOTE — Progress Notes (Signed)
[facility] day:  06/20/16   Chief Complaint: Donna Riley is a 56 y.o. female with stage IIA left breast cancer who is seen for 2 month assessment.  HPI: The patient was last seen in the medical oncology clinic on 04/18/2016.  At that time, she was seen following interval left breast lumpectomy.  Pathology was negative.  She was chronically fatigued.  She remained on Arimidex.  During the interim, she has still felt tires.  She can't sleep at night.  She states that her breast popped open.  She sees Dr. Azalee Course every 3 weeks.  Wound is healing by secondary intention.  She notes some drainage, but no purulence.  She sees Dr. Azalee Course every 3 weeks (next 06/22/2016).   Past Medical History:  Diagnosis Date  . Arthritis    shoulders and neck  . Breast cancer ([place]) 2016    Left breast with chemo + rad tx's with lumpectomy.  . Diabetes mellitus without complication ([place]) 05/9507  . Elevated LFTs   . Hypertension   . Personal history of chemotherapy   . Personal history of radiation therapy   . Positive PPD, treated   . Shortness of breath dyspnea    with exertion    Past Surgical History:  Procedure Laterality Date  . BREAST BIOPSY Left 07/31/2014   positive. Lumpectomy 08/27/2014 with chemo and rad  . BREAST BIOPSY Left 03/08/2016   path pending  . BREAST LUMPECTOMY WITH NEEDLE LOCALIZATION Left 04/05/2016   Procedure: BREAST LUMPECTOMY WITH NEEDLE LOCALIZATION;  Surgeon: Hubbard Robinson, MD;  Location: [facility];  Service: General;  Laterality: Left;  . BREAST LUMPECTOMY WITH NEEDLE LOCALIZATION AND AXILLARY SENTINEL LYMPH NODE BX Left 08/27/14  . CHOLECYSTECTOMY    . COLONOSCOPY WITH PROPOFOL N/A 08/28/2015   Procedure: COLONOSCOPY WITH PROPOFOL;  Surgeon: Lucilla Lame, MD;  Location: [facility];  Service: Endoscopy;  Laterality: N/A;  Diabetic oral  . ESOPHAGOGASTRODUODENOSCOPY  2014   gastritis  . HERNIA REPAIR    .  POLYPECTOMY  08/28/2015   Procedure: POLYPECTOMY INTESTINAL;  Surgeon: Lucilla Lame, MD;  Location: [facility];  Service: Endoscopy;;  Rectal polyp  . TUBAL LIGATION Bilateral   . VENTRAL HERNIA REPAIR N/A 04/10/2015   Procedure: HERNIA REPAIR VENTRAL ADULT;  Surgeon: Leonie Green, MD;  Location: [facility];  Service: General;  Laterality: N/A;    Family History  Problem Relation Age of Onset  . Cancer Mother     Pancreatic  . Cancer Father     Throat  . Cancer Maternal Grandmother     Breast  . Breast cancer Maternal Grandmother 75    Social History:  reports that she has been smoking Cigarettes.  She has a 9.50 pack-year smoking history. She has never used smokeless tobacco. She reports that she drinks alcohol. She reports that she does not use drugs.  She lives in [place].  The patient is alone today.  Allergies: No Known Allergies  Current Medications: Current Outpatient Prescriptions  Medication Sig Dispense Refill  . amLODipine (NORVASC) 5 MG tablet TAKE 1 TABLET EVERY DAY 90 tablet 0  . anastrozole (ARIMIDEX) 1 MG tablet Take 1 mg by mouth daily.    Marland Kitchen aspirin EC 81 MG tablet Take 81 mg by mouth daily.    Marland Kitchen buPROPion (WELLBUTRIN) 75 MG tablet Take 2 tablets (150 mg total) by mouth 2 (two) times daily. 60 tablet 3  . gabapentin (NEURONTIN) 300  MG capsule Take 1 capsule (300 mg total) by mouth 3 (three) times daily. (Patient taking differently: Take 300 mg by mouth 2 (two) times daily. ) 90 capsule 1  . HYDROcodone-acetaminophen (NORCO/VICODIN) 5-325 MG tablet Take 1 tablet by mouth at bedtime as needed for severe pain. 14 tablet 0  . lisinopril (PRINIVIL,ZESTRIL) 40 MG tablet Take 1 tablet (40 mg total) by mouth daily. 90 tablet 3  . metFORMIN (GLUCOPHAGE) 500 MG tablet Take 1 tablet (500 mg total) by mouth every morning. 90 tablet 1  . MICROLET LANCETS MISC     . Vitamin D, Cholecalciferol, 1000 units CAPS Take 1 tablet by mouth 1 day or 1 dose.     No current  facility-administered medications for this visit.     Review of Systems:  GENERAL: Feels tired.  No fevers or sweats.  Weight up 1 pound. PERFORMANCE STATUS (ECOG): 1 HEENT: No visual changes, runny nose, sore throat, mouth sores or tenderness. Lungs: No shortness of breath or cough. No hemoptysis. Cardiac: No chest pain, palpitations, orthopnea, or PND. Breast: s/p lumpectomy on 04/05/2016 for persistent nipple discharge.  Wound healing by secondary intention. GI: No nausea, vomiting, constipation, melena or hematochezia.  s/p colonoscsopy 08/28/2015. GU: No urgency, frequency, dysuria, or hematuria. Musculoskeletal: No back pain. No joint pain. No muscle tenderness. Extremities: No pain or swelling. Skin: No skin changes, rashes or ulcers. Neuro:No headache, numbness or weakness, balance or coordination issues. Endocrine: Hot flashes.  No diabetes, thyroid issues or night sweats. Psych: Trouble sleeping at night.  No mood changes, depression or anxiety. Pain: No focal pain. Review of systems: All other systems reviewed and found to be negative.  Physical Exam: Blood pressure (!) 148/82, pulse 89, temperature (!) 96.3 F (35.7 C), temperature source Tympanic, resp. rate 18, weight 205 lb 7.5 oz (93.2 kg). GENERAL: Well developed, well nourished, woman sitting comfortably in the exam room in no acute distress. MENTAL STATUS: Alert and oriented to person, place and time. HEAD: Braided hair. Normocephalic, atraumatic, face symmetric, no Cushingoid features. EYES: Brown eyes. Pupils equal round and reactive to light and accomodation. No conjunctivitis or scleral icterus. ENT: Oropharynx clear without lesion. Tongue normal. Mucous membranes moist.  RESPIRATORY: Clear to auscultation without rales, wheezes or rhonchi. CARDIOVASCULAR: Regular rate and rhythm without murmur, rub or gallop. BREASTS:  Right breast without masses, skin changes or nipple discharge.   Left breast with moderate hyperpigmentation.  Post surgical and post radiation changes with edema.  Arched incision.  Wound 1.5 cm.  No purulence. ABDOMEN: Ventral hernia.  Soft, non-tender, with active bowel sounds, and no hepatosplenomegaly. No masses. SKIN:  No rashes, ulcers or lesions. EXTREMITIES: No edema, no skin discoloration or tenderness. No palpable cords. LYMPH NODES: No palpable cervical, supraclavicular, axillary or inguinal adenopathy  NEUROLOGICAL: Unremarkable.  PSYCH: Appropriate.   Appointment on 06/20/2016  Component Date Value Ref Range Status  . WBC 06/20/2016 6.9  3.6 - 11.0 K/uL Final  . RBC 06/20/2016 4.87  3.80 - 5.20 MIL/uL Final  . Hemoglobin 06/20/2016 13.1  12.0 - 16.0 g/dL Final  . HCT 06/20/2016 39.8  35.0 - 47.0 % Final  . MCV 06/20/2016 81.7  80.0 - 100.0 fL Final  . MCH 06/20/2016 26.9  26.0 - 34.0 pg Final  . MCHC 06/20/2016 33.0  32.0 - 36.0 g/dL Final  . RDW 06/20/2016 14.3  11.5 - 14.5 % Final  . Platelets 06/20/2016 230  150 - 440 K/uL Final  . Neutrophils Relative %  06/20/2016 63  % Final  . Neutro Abs 06/20/2016 4.4  1.4 - 6.5 K/uL Final  . Lymphocytes Relative 06/20/2016 30  % Final  . Lymphs Abs 06/20/2016 2.1  1.0 - 3.6 K/uL Final  . Monocytes Relative 06/20/2016 4  % Final  . Monocytes Absolute 06/20/2016 0.3  0.2 - 0.9 K/uL Final  . Eosinophils Relative 06/20/2016 2  % Final  . Eosinophils Absolute 06/20/2016 0.1  0 - 0.7 K/uL Final  . Basophils Relative 06/20/2016 1  % Final  . Basophils Absolute 06/20/2016 0.1  0 - 0.1 K/uL Final  . Sodium 06/20/2016 139  135 - 145 mmol/L Final  . Potassium 06/20/2016 3.5  3.5 - 5.1 mmol/L Final  . Chloride 06/20/2016 108  101 - 111 mmol/L Final  . CO2 06/20/2016 25  22 - 32 mmol/L Final  . Glucose, Bld 06/20/2016 133* 65 - 99 mg/dL Final  . BUN 06/20/2016 11  6 - 20 mg/dL Final  . Creatinine, Ser 06/20/2016 0.77  0.44 - 1.00 mg/dL Final  . Calcium 06/20/2016 9.1  8.9 - 10.3 mg/dL Final  .  Total Protein 06/20/2016 7.5  6.5 - 8.1 g/dL Final  . Albumin 06/20/2016 4.0  3.5 - 5.0 g/dL Final  . AST 06/20/2016 22  15 - 41 U/L Final  . ALT 06/20/2016 22  14 - 54 U/L Final  . Alkaline Phosphatase 06/20/2016 95  38 - 126 U/L Final  . Total Bilirubin 06/20/2016 0.2* 0.3 - 1.2 mg/dL Final  . GFR calc non Af Amer 06/20/2016 >60  >60 mL/min Final  . GFR calc Af Amer 06/20/2016 >60  >60 mL/min Final   Comment: (NOTE) The eGFR has been calculated using the CKD EPI equation. This calculation has not been validated in all clinical situations. eGFR's persistently <60 mL/min signify possible Chronic Kidney Disease.   . Anion gap 06/20/2016 6  5 - 15 Final    Assessment:  ADALAE BAYSINGER is a 56 y.o. African American female with stage IIA left breast cancer status post partial mastectomy and sentinel lymph node biopsy on 08/27/2014. 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 There was lymphovascular invasion. One of 2 sentinel lymph nodes were positive (focus of 2.8 mm). Tumor was > 90% ER positive, > 90% PR positive, and Her2/neu negative. Pathologic stage was [id number].  Bone scan on 09/16/2014 revealed abnormal focal uptake at the level of right L3 pedicle and L5 vertebral body. Lumbar spine MRI on 09/27/2014 revealed no evidence of metastatic disease with lower lumbar facet arthritis. Abdominal and pelvic CT scan on 09/24/2014 revealed hepatomegaly and no evidence of metastatic disease.   Bone density study on 09/15/2014 revealed osteopenia with a T-score of -2.1 at L1-L4.  She is on calcium and vitamin D.  She received 4 cycles of Taxotere and Cytoxan (09/29/2014 - 12/09/2014) with Neulasta support. She received 50.4 Gy to the left breast from 01/12/2015 until 02/23/2015.  She was started on Femara on 03/12/2015, but switched to Arimidex on 03/30/2015 secondary to diffuse joint aches.    CA27.29 has been followed:  16.9 on 09/09/2014, 12.1 on 06/02/2015, 15.6 on 08/07/2015, 17.4 on 11/23/2015, 13.6 on 02/22/2016, and 18.9 on 06/20/2016.  Bilateral diagnostic mammogram on 07/30/2015 revealed no evidence of malignancy.  Left sided mammogram and ultrasound on 02/29/2016 revealed a 1.5 x 0.6 x 0.7 cm intraductal mass.    She had chronic left nipple discharge.  Left breast lumpectomy at the 11 o'clock position on 04/05/2016 revealed an intraductal papilloma with sclerosis and calcifications. There was fat necrosis with fibrosis and calcifications. Pathology was negative for atypia and malignancy  Colonoscopy on 08/28/2015 revealed one 4 mm polyp in the rectum (tubular adenoma).  Symptomatically, she is doing well on Arimidex.  She has chronic fatigue.  Exam reveals left breast wound healing by secondary intention.  Plan: 1.  Labs today:  CBC with diff, CMP, CA27.29, TSH. 2.  Follow-up as scheduled with Dr. Azalee Course. 3.  Continue Arimidex.  4.  RTC in 4 months for MD assessment and labs (CBC with diff, CMP, CA27.29).   Lequita Asal, MD  06/20/2016, 2:27 PM

## 2016-06-20 NOTE — Progress Notes (Signed)
Patient states the wound in her breast from surgery is finally closing up.  She has a follow up with her surgeon on Wednesday.  Patient complains of left elbow.  States it has a rash and is very tender with movement.  BP elevated 156/81 HR 88  Recheck 148/82 HR 89.  Patient states she did not take her BP meds today until about 11:30.

## 2016-06-21 LAB — CANCER ANTIGEN 27.29: CA 27.29: 18.9 U/mL (ref 0.0–38.6)

## 2016-06-22 ENCOUNTER — Encounter: Payer: Self-pay | Admitting: Surgery

## 2016-06-23 ENCOUNTER — Ambulatory Visit (INDEPENDENT_AMBULATORY_CARE_PROVIDER_SITE_OTHER): Payer: Medicare HMO | Admitting: Surgery

## 2016-06-23 ENCOUNTER — Encounter: Payer: Self-pay | Admitting: Surgery

## 2016-06-23 VITALS — BP 175/111 | HR 103 | Temp 97.8°F | Wt 203.0 lb

## 2016-06-23 DIAGNOSIS — S21002S Unspecified open wound of left breast, sequela: Secondary | ICD-10-CM

## 2016-06-23 HISTORY — DX: Unspecified open wound of left breast, sequela: S21.002S

## 2016-06-23 MED ORDER — HYDROCODONE-ACETAMINOPHEN 5-325 MG PO TABS: 1.0000 | ORAL_TABLET | Freq: Four times a day (QID) | ORAL | 0 refills | Status: DC | PRN

## 2016-06-23 MED ORDER — HYDROCODONE-ACETAMINOPHEN 5-325 MG PO TABS: 1.0000 | ORAL_TABLET | Freq: Every evening | ORAL | 0 refills | Status: DC | PRN

## 2016-06-23 NOTE — Progress Notes (Signed)
06/23/2016  HPI: Patient is status post left breast lumpectomy for intraductal papilloma. Wound had opened up and has been seen in the clinic for wound checks and dressing changes. She reports that her pain around incision is improving although she still has significant pain. She noticed some serous drainage but no purulence anymore. Reports that the area softer as well.  Still reports that she has discomfort at the umbilicus from a new hernia that she was diagnosed with.  Vital signs: BP (!) 175/111   Pulse (!) 103   Temp 97.8 F (36.6 C) (Oral)   Wt 92.1 kg (203 lb)   BMI 30.87 kg/m    Physical Exam: Constitutional: No acute distress Rest: Left breast wound measures approximately 1.5 cm in length and about 1 cm in depth. Is no purulent drainage or evidence of infection. The patient still has tenderness to palpation in that area.  Assessment/Plan: 56 year old female status post left breast lumpectomy for intraductal papilloma. -Continue dry gauze dressing changes daily and as needed. No packing is required. The wound is healing well and appropriately. -We'll refill the patient's pain medication given that she still having significant pain. No other new medication is needed at this point. -Patient will follow up in 2-3 weeks for another wound check and to discuss her umbilical hernia which the patient is seeking repair for but wanted to wait until after the breast wound is fully healed.   Melvyn Neth, [place]

## 2016-06-23 NOTE — Patient Instructions (Signed)
Please continue to pack your wound at least once a day. Make sure that you place a fair amount of gauze inside your wound so we make sure that you heal from the inside out.  We will see you back in three weeks to make sure that your wound had completely healed and to then discuss about repairing your umbilical hernia.  Please give [place] a call if you have any questions or concerns.

## 2016-07-13 ENCOUNTER — Encounter: Payer: Self-pay | Admitting: Surgery

## 2016-07-13 ENCOUNTER — Encounter: Payer: Medicare HMO | Admitting: Surgery

## 2016-07-13 ENCOUNTER — Ambulatory Visit (INDEPENDENT_AMBULATORY_CARE_PROVIDER_SITE_OTHER): Payer: Medicare HMO | Admitting: Surgery

## 2016-07-13 VITALS — BP 179/92 | HR 85 | Temp 98.3°F | Wt 205.0 lb

## 2016-07-13 DIAGNOSIS — S21002S Unspecified open wound of left breast, sequela: Secondary | ICD-10-CM | POA: Diagnosis not present

## 2016-07-13 DIAGNOSIS — K429 Umbilical hernia without obstruction or gangrene: Secondary | ICD-10-CM | POA: Insufficient documentation

## 2016-07-13 NOTE — Progress Notes (Signed)
07/13/2016  HPI: Patient is status post left breast lumpectomy for intraductal papilloma on 11/7 with Dr. Loflin. The wound had opened up and has been seen in the clinic for wound checks and dressing changes. She was last seen on 1/25 at which time the wound was almost closed. She presents now for further follow-up. She denies having any issues with the left breast and denies any tenderness. Denies any drainage. Reported the wound is fully healed.  However she does endorse that she had significant pain on her umbilical region over the weekend and is wanting to discuss surgery for her umbilical hernia.  Vital signs: BP (!) 179/92   Pulse 85   Temp 98.3 F (36.8 C) (Oral)   Wt 93 kg (205 lb)   BMI 31.17 kg/m    Physical Exam: Constitutional: No acute distress Breast: Left breast wound is fully healed now with no exposed tissue. There is no induration or cellulitis and no drainage. No palpable masses. Abdomen: Soft, nondistended, with tenderness to palpation of the umbilical region. There is a small approximately 1 cm umbilical hernia defect with nothing bulging through it. No cellulitis or induration or skin changes.  Assessment/Plan: 55-year-old female status post left breast lumpectomy with a new umbilical hernia.  -Her left breast wound has fully healed and no further dressing changes are needed. -Discussed with the patient regarding her umbilical hernia. She has had a cholecystectomy in the past as well as a tubal ligation and a ventral hernia repair with mesh with Dr. Smith.  This new hernia appears to be at the inferior portion of Dr. Smith's hernia repair, or it could be a new hernia defect based on her previous surgeries.  Discussed with the patient that we would start with an incision at the umbilicus and explore to see if it extends superiorly towards her prior repair.  The patient understands this plan and is willing to proceed.  Risks and benefits were explained to the patient.  All  questions were answered.  She will be booked for 3/13.   Tri Chittick Luis Deven Furia, MD [facility]  

## 2016-07-13 NOTE — Patient Instructions (Signed)
Please give [place] a call if you have any questions or concerns.  Please look at your blue sheet in case you have any questions about your surgery.  Please stop your Aspirin 5 days prior to your surgery date (08/09/2016).

## 2016-07-14 ENCOUNTER — Telehealth: Payer: Self-pay

## 2016-07-14 NOTE — Telephone Encounter (Signed)
Patient has been advised of Surgery Date as well as Pre-Admission appointment date, time, and location.  Surgery Date: 08/09/16  Pre-admit Appointment: 08/02/16 from 0900-1300 (Phone)  Patient has been advised to call [phone] the day before surgery between 1-3pm to obtain arrival time.

## 2016-07-15 NOTE — Telephone Encounter (Signed)
No authorization needed for CPT code [id number]. Reference [phone]

## 2016-08-01 DIAGNOSIS — R5383 Other fatigue: Secondary | ICD-10-CM | POA: Insufficient documentation

## 2016-08-01 HISTORY — DX: Other fatigue: R53.83

## 2016-08-02 ENCOUNTER — Encounter
Admission: RE | Admit: 2016-08-02 | Discharge: 2016-08-02 | Disposition: A | Discharge status: Home / Self Care | Payer: Commercial Managed Care - HMO | Source: Ambulatory Visit | Attending: Surgery | Admitting: Surgery

## 2016-08-02 NOTE — Patient Instructions (Signed)
  Your procedure is scheduled on: 08-09-16 Report to Same Day Surgery 2nd floor medical mall [facility] Entrance-take elevator on left to 2nd floor.  Check in with surgery information desk.) To find out your arrival time please call [phone] between 1PM - 3PM on 08-08-16  Remember: Instructions that are not followed completely may result in serious medical risk, up to and including death, or upon the discretion of your surgeon and anesthesiologist your surgery may need to be rescheduled.    _x___ 1. Do not eat food or drink liquids after midnight. No gum chewing or                              hard candies.     __x__ 2. No Alcohol for 24 hours before or after surgery.   __x__3. No Smoking for 24 prior to surgery.   ____  4. Bring all medications with you on the day of surgery if instructed.    __x__ 5. Notify your doctor if there is any change in your medical condition     (cold, fever, infections).     Do not wear jewelry, make-up, hairpins, clips or nail polish.  Do not wear lotions, powders, or perfumes. You may wear deodorant.  Do not shave 48 hours prior to surgery. Men may shave face and neck.  Do not bring valuables to the hospital.    [facility] is not responsible for any belongings or valuables.               Contacts, dentures or bridgework may not be worn into surgery.  Leave your suitcase in the car. After surgery it may be brought to your room.  For patients admitted to the hospital, discharge time is determined by your                       treatment team.   Patients discharged the day of surgery will not be allowed to drive home.  You will need someone to drive you home and stay with you the night of your procedure.    Please read over the following fact sheets that you were given:   [facility] [place] Preparing for Surgery and or MRSA Information   _x___ Take anti-hypertensive (unless it includes a diuretic), cardiac, seizure, asthma, anti-reflux and psychiatric  medicines. These include:  1. LISINOPRIL  2. AMLODIPINE  3.  4.  5.  6.  ____Fleets enema or Magnesium Citrate as directed.   ____ Use CHG Soap or sage wipes as directed on instruction sheet   ____ Use inhalers on the day of surgery and bring to hospital day of surgery  _X___ Stop Metformin and Janumet 2 days prior to surgery-LAST DOSE ON Saturday, MARCH 10TH    ____ Take 1/2 of usual insulin dose the night before surgery and none on the morning of surgery.   _x___ Follow recommendations from Cardiologist, Pulmonologist or PCP regarding stopping Aspirin, Coumadin, Pllavix ,Eliquis, Effient, or Pradaxa, and Pletal.  X____Stop Anti-inflammatories such as Advil, Aleve, Ibuprofen, Motrin, Naproxen,           Naprosyn, Goodies powders, BC POWDERS or aspirin products NOW-OK to take Tylenol and Celebrex.   ____ Stop supplements until after surgery.    ____ Bring C-Pap to the hospital.

## 2016-08-08 MED ORDER — CEFAZOLIN SODIUM-DEXTROSE 2-4 GM/100ML-% IV SOLN
2.0000 g | INTRAVENOUS | Status: AC
  Administered 2016-08-09: 2 g via INTRAVENOUS

## 2016-08-08 MED ORDER — ACETAMINOPHEN 500 MG PO TABS: 1000.0000 mg | ORAL_TABLET | ORAL | Status: DC

## 2016-08-08 MED ORDER — FAMOTIDINE 20 MG PO TABS
20.0000 mg | ORAL_TABLET | Freq: Once | ORAL | Status: AC
  Administered 2016-08-09: 20 mg via ORAL

## 2016-08-09 ENCOUNTER — Encounter: Payer: Self-pay | Admitting: *Deleted

## 2016-08-09 ENCOUNTER — Encounter
Admission: RE | Disposition: A | Discharge status: Home / Self Care | Payer: Self-pay | Source: Ambulatory Visit | Attending: Surgery

## 2016-08-09 ENCOUNTER — Ambulatory Visit: Payer: Medicare HMO | Admitting: Certified Registered"

## 2016-08-09 ENCOUNTER — Ambulatory Visit
Admission: RE | Admit: 2016-08-09 | Discharge: 2016-08-09 | Disposition: A | Discharge status: Home / Self Care | Payer: Medicare HMO | Source: Ambulatory Visit | Attending: Surgery | Admitting: Surgery

## 2016-08-09 DIAGNOSIS — K432 Incisional hernia without obstruction or gangrene: Secondary | ICD-10-CM | POA: Diagnosis present

## 2016-08-09 DIAGNOSIS — K429 Umbilical hernia without obstruction or gangrene: Secondary | ICD-10-CM

## 2016-08-09 DIAGNOSIS — F17200 Nicotine dependence, unspecified, uncomplicated: Secondary | ICD-10-CM | POA: Diagnosis not present

## 2016-08-09 DIAGNOSIS — I10 Essential (primary) hypertension: Secondary | ICD-10-CM | POA: Diagnosis not present

## 2016-08-09 DIAGNOSIS — E119 Type 2 diabetes mellitus without complications: Secondary | ICD-10-CM | POA: Diagnosis not present

## 2016-08-09 DIAGNOSIS — Z923 Personal history of irradiation: Secondary | ICD-10-CM | POA: Diagnosis not present

## 2016-08-09 DIAGNOSIS — Z9221 Personal history of antineoplastic chemotherapy: Secondary | ICD-10-CM | POA: Insufficient documentation

## 2016-08-09 DIAGNOSIS — J449 Chronic obstructive pulmonary disease, unspecified: Secondary | ICD-10-CM | POA: Diagnosis not present

## 2016-08-09 DIAGNOSIS — Z853 Personal history of malignant neoplasm of breast: Secondary | ICD-10-CM | POA: Insufficient documentation

## 2016-08-09 DIAGNOSIS — K430 Incisional hernia with obstruction, without gangrene: Secondary | ICD-10-CM | POA: Diagnosis not present

## 2016-08-09 DIAGNOSIS — R69 Illness, unspecified: Secondary | ICD-10-CM | POA: Diagnosis not present

## 2016-08-09 HISTORY — PX: UMBILICAL HERNIA REPAIR: SHX196

## 2016-08-09 LAB — GLUCOSE, CAPILLARY
Glucose-Capillary: 118 mg/dL — ABNORMAL HIGH (ref 65–99)
Glucose-Capillary: 108 mg/dL — ABNORMAL HIGH (ref 65–99)

## 2016-08-09 SURGERY — REPAIR, HERNIA, UMBILICAL, ADULT
Anesthesia: General | Wound class: Clean

## 2016-08-09 MED ORDER — IBUPROFEN 600 MG PO TABS: 600.0000 mg | ORAL_TABLET | Freq: Three times a day (TID) | ORAL | 0 refills | Status: DC | PRN

## 2016-08-09 MED ORDER — MIDAZOLAM HCL 2 MG/2ML IJ SOLN
INTRAMUSCULAR | Status: DC | PRN
  Administered 2016-08-09: 2 mg via INTRAVENOUS

## 2016-08-09 MED ORDER — CHLORHEXIDINE GLUCONATE CLOTH 2 % EX PADS: 6.0000 | MEDICATED_PAD | Freq: Once | CUTANEOUS | Status: DC

## 2016-08-09 MED ORDER — SODIUM CHLORIDE 0.9 % IV SOLN
INTRAVENOUS | Status: DC
  Administered 2016-08-09: 10:00:00 via INTRAVENOUS

## 2016-08-09 MED ORDER — ACETAMINOPHEN 10 MG/ML IV SOLN
INTRAVENOUS | Status: AC
  Filled 2016-08-09: qty 100

## 2016-08-09 MED ORDER — OXYCODONE HCL 5 MG PO TABS
ORAL_TABLET | ORAL | Status: AC
  Filled 2016-08-09: qty 1

## 2016-08-09 MED ORDER — ROCURONIUM BROMIDE 50 MG/5ML IV SOLN
INTRAVENOUS | Status: AC
  Filled 2016-08-09: qty 1

## 2016-08-09 MED ORDER — OXYCODONE HCL 5 MG/5ML PO SOLN: 5.0000 mg | Freq: Once | ORAL | Status: AC | PRN

## 2016-08-09 MED ORDER — OXYCODONE HCL 5 MG PO TABS: 5.0000 mg | ORAL_TABLET | Freq: Four times a day (QID) | ORAL | 0 refills | Status: DC | PRN

## 2016-08-09 MED ORDER — FENTANYL CITRATE (PF) 100 MCG/2ML IJ SOLN
25.0000 ug | INTRAMUSCULAR | Status: DC | PRN
  Administered 2016-08-09: 50 ug via INTRAVENOUS
  Administered 2016-08-09 (×2): 25 ug via INTRAVENOUS

## 2016-08-09 MED ORDER — OXYCODONE HCL 5 MG PO TABS
5.0000 mg | ORAL_TABLET | Freq: Once | ORAL | Status: AC | PRN
  Administered 2016-08-09: 5 mg via ORAL

## 2016-08-09 MED ORDER — ONDANSETRON HCL 4 MG/2ML IJ SOLN
INTRAMUSCULAR | Status: DC | PRN
  Administered 2016-08-09: 4 mg via INTRAVENOUS

## 2016-08-09 MED ORDER — DEXAMETHASONE SODIUM PHOSPHATE 10 MG/ML IJ SOLN
INTRAMUSCULAR | Status: AC
  Filled 2016-08-09: qty 1

## 2016-08-09 MED ORDER — ACETAMINOPHEN 10 MG/ML IV SOLN
INTRAVENOUS | Status: DC | PRN
  Administered 2016-08-09: 1000 mg via INTRAVENOUS

## 2016-08-09 MED ORDER — KETOROLAC TROMETHAMINE 30 MG/ML IJ SOLN
INTRAMUSCULAR | Status: DC | PRN
  Administered 2016-08-09: 30 mg via INTRAVENOUS

## 2016-08-09 MED ORDER — PROPOFOL 10 MG/ML IV BOLUS
INTRAVENOUS | Status: AC
  Filled 2016-08-09: qty 20

## 2016-08-09 MED ORDER — PROPOFOL 10 MG/ML IV BOLUS
INTRAVENOUS | Status: DC | PRN
  Administered 2016-08-09: 150 mg via INTRAVENOUS

## 2016-08-09 MED ORDER — FENTANYL CITRATE (PF) 100 MCG/2ML IJ SOLN
INTRAMUSCULAR | Status: AC
  Filled 2016-08-09: qty 2

## 2016-08-09 MED ORDER — BUPIVACAINE-EPINEPHRINE (PF) 0.5% -1:200000 IJ SOLN
INTRAMUSCULAR | Status: DC | PRN
  Administered 2016-08-09: 20 mL

## 2016-08-09 MED ORDER — ONDANSETRON HCL 4 MG/2ML IJ SOLN
INTRAMUSCULAR | Status: AC
  Filled 2016-08-09: qty 2

## 2016-08-09 MED ORDER — FENTANYL CITRATE (PF) 100 MCG/2ML IJ SOLN
INTRAMUSCULAR | Status: DC | PRN
  Administered 2016-08-09 (×2): 50 ug via INTRAVENOUS

## 2016-08-09 MED ORDER — BUPIVACAINE-EPINEPHRINE (PF) 0.5% -1:200000 IJ SOLN
INTRAMUSCULAR | Status: AC
  Filled 2016-08-09: qty 30

## 2016-08-09 SURGICAL SUPPLY — 23 items
BLADE CLIPPER SURG (BLADE) IMPLANT
BLADE SURG 15 STRL LF DISP TIS (BLADE) ×1 IMPLANT
BLADE SURG 15 STRL SS (BLADE) ×1
CANISTER SUCT 1200ML W/VALVE (MISCELLANEOUS) ×2 IMPLANT
CHLORAPREP W/TINT 26ML (MISCELLANEOUS) ×2 IMPLANT
DERMABOND ADVANCED (GAUZE/BANDAGES/DRESSINGS) ×1
DERMABOND ADVANCED .7 DNX12 (GAUZE/BANDAGES/DRESSINGS) ×1 IMPLANT
DRAPE PED LAPAROTOMY (DRAPES) ×2 IMPLANT
DRSG TELFA 3X8 NADH (GAUZE/BANDAGES/DRESSINGS) IMPLANT
ELECT REM PT RETURN 9FT ADLT (ELECTROSURGICAL) ×2
ELECTRODE REM PT RTRN 9FT ADLT (ELECTROSURGICAL) ×1 IMPLANT
GLOVE PROTEXIS LATEX SZ 7.5 (GLOVE) IMPLANT
GLOVE SURG SYN 7.0 (GLOVE) ×10 IMPLANT
GOWN STRL REUS W/ TWL LRG LVL3 (GOWN DISPOSABLE) ×3 IMPLANT
GOWN STRL REUS W/TWL LRG LVL3 (GOWN DISPOSABLE) ×3
NEEDLE HYPO 22GX1.5 SAFETY (NEEDLE) ×2 IMPLANT
NS IRRIG 500ML POUR BTL (IV SOLUTION) ×2 IMPLANT
PACK BASIN MINOR ARMC (MISCELLANEOUS) ×2 IMPLANT
SUT ETHIBOND 0 MO6 C/R (SUTURE) ×2 IMPLANT
SUT MNCRL AB 4-0 PS2 18 (SUTURE) ×2 IMPLANT
SUT VIC AB 3-0 SH 27 (SUTURE) ×1
SUT VIC AB 3-0 SH 27X BRD (SUTURE) ×1 IMPLANT
SYR 20CC LL (SYRINGE) ×2 IMPLANT

## 2016-08-09 NOTE — Op Note (Signed)
Procedure Date:  08/09/2016  Pre-operative Diagnosis:  Umbilical incisional hernia  Post-operative Diagnosis:  Umbilical incisional hernia  Procedure:  Umbilical hernia repair  Surgeon:  Melvyn Neth, MD  Anesthesia:  General endotracheal  Estimated Blood Loss:  5 ml  Specimens:  None  Complications:  None  Indications for Procedure:  This is a 56 y.o. female who presents with an umbilical incisional hernia.  The risks of bleeding, abscess or infection, injury to surrounding structures, and need for further procedures were all discussed with the patient and was willing to proceed.  Description of Procedure: The patient was correctly identified in the preoperative area and brought into the operating room.  The patient was placed supine with VTE prophylaxis in place.  Appropriate time-outs were performed.  Anesthesia was induced and the patient was intubated.  Appropriate antibiotics were infused.  The abdomen was prepped and draped in a sterile fashion.  The patient had had a prior supraumbilical ventral hernia repair and prior umbilical incisions for laparoscopic procedures.  A 5 cm incision was made encompassing the lower portion of the prior ventral hernia repair and the superior portion of the umbilicus. The subcutaneous tissue was dissected down using cautery and a small 1 cm defect was noted in the left portion of the umbilicus, with a small segment of omentum protruding. An adjacent area of weakness was also found.  The defect was extended to encompass this area as well, creating a 2 cm defect.  The fascial edges were dissected free around the defect, allowing the omentum to be reduced completely.  The defect was then closed with 3 0 Ethibond sutures in figure of eight fashion.  The cavity was irrigated and hemostasis controlled.  20 ml of local anesthetic were infused. The wound was then closed in layers using 3-0 Vicryl and 4-0 Monocryl.  The incision was cleaned and sealed with  DermaBond.  The patient was emerged from anesthesia and extubated and brought to the recovery room for further management.  The patient tolerated the procedure well and all counts were correct at the end of the case.   Melvyn Neth, MD

## 2016-08-09 NOTE — Discharge Instructions (Signed)

## 2016-08-09 NOTE — Anesthesia Preprocedure Evaluation (Signed)
Anesthesia Evaluation  Patient identified by MRN, date of birth, ID band Patient awake    Reviewed: Allergy & Precautions, H&P , NPO status , Patient's Chart, lab work & pertinent test results  History of Anesthesia Complications Negative for: history of anesthetic complications  Airway Mallampati: III  TM Distance: >3 FB Neck ROM: full    Dental  (+) Poor Dentition, Chipped, Caps   Pulmonary neg shortness of breath, COPD, Current Smoker,    Pulmonary exam normal breath sounds clear to auscultation       Cardiovascular Exercise Tolerance: Good hypertension, (-) angina(-) CAD and (-) DOE Normal cardiovascular exam Rhythm:regular Rate:Normal     Neuro/Psych negative neurological ROS  negative psych ROS   GI/Hepatic negative GI ROS, Neg liver ROS, neg GERD  ,  Endo/Other  diabetes, Type 2  Renal/GU      Musculoskeletal  (+) Arthritis ,   Abdominal   Peds  Hematology negative hematology ROS (+)   Anesthesia Other Findings Past Medical History: No date: Arthritis     Comment: shoulders and neck 2016: Breast cancer ([place])     Comment:  Left breast with chemo + rad tx's with               lumpectomy. 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 Lumpectomy 08/27/2014 with chemo and               rad 03/08/2016: BREAST BIOPSY Left     Comment: path pending 04/05/2016: BREAST LUMPECTOMY WITH NEEDLE LOCALIZATION Left     Comment: Procedure: BREAST LUMPECTOMY WITH NEEDLE               LOCALIZATION;  Surgeon: Hubbard Robinson, MD;              Location: [facility];  Service: General;    Laterality: Left; 08/27/14: BREAST LUMPECTOMY WITH NEEDLE LOCALIZATION AND* Left No date: CHOLECYSTECTOMY 08/28/2015: COLONOSCOPY WITH PROPOFOL N/A     Comment: Procedure: COLONOSCOPY WITH PROPOFOL;                Surgeon: Lucilla Lame, MD;  Location: [place];  Service: Endoscopy;  Laterality:              N/A;  Diabetic oral 2014: ESOPHAGOGASTRODUODENOSCOPY     Comment: gastritis No date: HERNIA REPAIR 08/28/2015: POLYPECTOMY     Comment: Procedure: POLYPECTOMY INTESTINAL;  Surgeon:               Lucilla Lame, MD;  Location: [place];  Service: Endoscopy;;  Rectal polyp No date: TUBAL LIGATION Bilateral 04/10/2015: VENTRAL HERNIA REPAIR N/A     Comment: Procedure: HERNIA REPAIR VENTRAL ADULT;                Surgeon: Leonie Green, MD;  Location:               [facility];  Service: General;  Laterality: N/A;     Reproductive/Obstetrics negative OB ROS  Anesthesia Physical Anesthesia Plan  ASA: III  Anesthesia Plan: General LMA   Post-op Pain Management:    Induction:   Airway Management Planned:   Additional Equipment:   Intra-op Plan:   Post-operative Plan:   Informed Consent: I have reviewed the patients History and Physical, chart, labs and discussed the procedure including the risks, benefits and alternatives for the proposed anesthesia with the patient or authorized representative who has indicated his/her understanding and acceptance.   Dental Advisory Given  Plan Discussed with: Anesthesiologist, CRNA and Surgeon  Anesthesia Plan Comments:         Anesthesia Quick Evaluation

## 2016-08-09 NOTE — Transfer of Care (Signed)
Immediate Anesthesia Transfer of Care Note  Patient: JANINE RELLER  Procedure(s) Performed: Procedure(s): HERNIA REPAIR UMBILICAL ADULT (N/A)  Patient Location: PACU  Anesthesia Type:General  Level of Consciousness: sedated  Airway & Oxygen Therapy: Patient Spontanous Breathing and Patient connected to face mask oxygen  Post-op Assessment: Report given to RN and Post -op Vital signs reviewed and stable  Post vital signs: Reviewed and stable  Last Vitals:  Vitals:   08/09/16 0909  BP: (!) 142/82  Pulse: 69  Resp: 16  Temp: 36.9 C    Last Pain:  Vitals:   08/09/16 0909  TempSrc: Oral  PainSc: 3          Complications: No apparent anesthesia complications

## 2016-08-09 NOTE — Anesthesia Procedure Notes (Signed)
Procedure Name: LMA Insertion Performed by: Lance Muss Pre-anesthesia Checklist: Patient identified, Patient being monitored, Timeout performed, Emergency Drugs available and Suction available Patient Re-evaluated:Patient Re-evaluated prior to inductionOxygen Delivery Method: Circle system utilized Preoxygenation: Pre-oxygenation with 100% oxygen Intubation Type: IV induction LMA: LMA inserted LMA Size: 3.0 Tube type: Oral Number of attempts: 1 Placement Confirmation: positive ETCO2 and breath sounds checked- equal and bilateral Tube secured with: Tape Dental Injury: Teeth and Oropharynx as per pre-operative assessment

## 2016-08-09 NOTE — Anesthesia Postprocedure Evaluation (Signed)
Anesthesia Post Note  Patient: Donna Riley  Procedure(s) Performed: Procedure(s) (LRB): HERNIA REPAIR UMBILICAL ADULT (N/A)  Patient location during evaluation: PACU Anesthesia Type: General Level of consciousness: awake and alert Pain management: pain level controlled Vital Signs Assessment: post-procedure vital signs reviewed and stable Respiratory status: spontaneous breathing, nonlabored ventilation, respiratory function stable and patient connected to nasal cannula oxygen Cardiovascular status: blood pressure returned to baseline and stable Postop Assessment: no signs of nausea or vomiting Anesthetic complications: no     Last Vitals:  Vitals:   08/09/16 1223 08/09/16 1259  BP: (!) 158/74 (!) 175/99  Pulse: 61 61  Resp: 16 16  Temp: 36.6 C     Last Pain:  Vitals:   08/09/16 1259  TempSrc:   PainSc: 5                  Precious Haws Franz Svec

## 2016-08-09 NOTE — Interval H&P Note (Signed)
History and Physical Interval Note:  08/09/2016 9:22 AM  Donna Riley  has presented today for surgery, with the diagnosis of umbilical hernia  The various methods of treatment have been discussed with the patient and family. After consideration of risks, benefits and other options for treatment, the patient has consented to  Procedure(s): HERNIA REPAIR UMBILICAL ADULT (N/A) as a surgical intervention .  The patient's history has been reviewed, patient examined, no change in status, stable for surgery.  I have reviewed the patient's chart and labs.  Questions were answered to the patient's satisfaction.     Destine Ambroise

## 2016-08-09 NOTE — H&P (View-Only) (Signed)
07/13/2016  HPI: Patient is status post left breast lumpectomy for intraductal papilloma on 11/7 with Dr. Azalee Course. The wound had opened up and has been seen in the clinic for wound checks and dressing changes. She was last seen on 1/25 at which time the wound was almost closed. She presents now for further follow-up. She denies having any issues with the left breast and denies any tenderness. Denies any drainage. Reported the wound is fully healed.  However she does endorse that she had significant pain on her umbilical region over the weekend and is wanting to discuss surgery for her umbilical hernia.  Vital signs: BP (!) 179/92   Pulse 85   Temp 98.3 F (36.8 C) (Oral)   Wt 93 kg (205 lb)   BMI 31.17 kg/m    Physical Exam: Constitutional: No acute distress Breast: Left breast wound is fully healed now with no exposed tissue. There is no induration or cellulitis and no drainage. No palpable masses. Abdomen: Soft, nondistended, with tenderness to palpation of the umbilical region. There is a small approximately 1 cm umbilical hernia defect with nothing bulging through it. No cellulitis or induration or skin changes.  Assessment/Plan: 56 year old female status post left breast lumpectomy with a new umbilical hernia.  -Her left breast wound has fully healed and no further dressing changes are needed. -Discussed with the patient regarding her umbilical hernia. She has had a cholecystectomy in the past as well as a tubal ligation and a ventral hernia repair with mesh with Dr. Tamala Julian.  This new hernia appears to be at the inferior portion of Dr. Thompson Caul hernia repair, or it could be a new hernia defect based on her previous surgeries.  Discussed with the patient that we would start with an incision at the umbilicus and explore to see if it extends superiorly towards her prior repair.  The patient understands this plan and is willing to proceed.  Risks and benefits were explained to the patient.  All  questions were answered.  She will be booked for 3/13.   Melvyn Neth, [place]

## 2016-08-09 NOTE — Anesthesia Post-op Follow-up Note (Cosign Needed)
Anesthesia QCDR form completed.        

## 2016-08-10 ENCOUNTER — Encounter: Payer: Self-pay | Admitting: Surgery

## 2016-08-11 ENCOUNTER — Encounter: Payer: Self-pay | Admitting: Surgery

## 2016-08-25 DIAGNOSIS — R7611 Nonspecific reaction to tuberculin skin test without active tuberculosis: Secondary | ICD-10-CM

## 2016-08-25 DIAGNOSIS — M1990 Unspecified osteoarthritis, unspecified site: Secondary | ICD-10-CM | POA: Insufficient documentation

## 2016-08-25 DIAGNOSIS — R7989 Other specified abnormal findings of blood chemistry: Secondary | ICD-10-CM | POA: Insufficient documentation

## 2016-08-25 DIAGNOSIS — E119 Type 2 diabetes mellitus without complications: Secondary | ICD-10-CM

## 2016-08-25 DIAGNOSIS — Z9221 Personal history of antineoplastic chemotherapy: Secondary | ICD-10-CM | POA: Insufficient documentation

## 2016-08-25 DIAGNOSIS — R945 Abnormal results of liver function studies: Secondary | ICD-10-CM

## 2016-08-25 DIAGNOSIS — Z923 Personal history of irradiation: Secondary | ICD-10-CM | POA: Insufficient documentation

## 2016-08-25 DIAGNOSIS — I10 Essential (primary) hypertension: Secondary | ICD-10-CM | POA: Insufficient documentation

## 2016-08-26 ENCOUNTER — Encounter: Payer: Self-pay | Admitting: *Deleted

## 2016-08-30 ENCOUNTER — Encounter: Payer: Self-pay | Admitting: Surgery

## 2016-08-30 ENCOUNTER — Ambulatory Visit (INDEPENDENT_AMBULATORY_CARE_PROVIDER_SITE_OTHER): Payer: Medicare HMO | Admitting: Surgery

## 2016-08-30 VITALS — BP 171/102 | HR 74 | Temp 97.8°F | Ht 68.0 in | Wt 209.4 lb

## 2016-08-30 DIAGNOSIS — S21002S Unspecified open wound of left breast, sequela: Secondary | ICD-10-CM

## 2016-08-30 DIAGNOSIS — Z09 Encounter for follow-up examination after completed treatment for conditions other than malignant neoplasm: Secondary | ICD-10-CM

## 2016-08-30 MED ORDER — IBUPROFEN 600 MG PO TABS: 600.0000 mg | ORAL_TABLET | Freq: Three times a day (TID) | ORAL | 0 refills | Status: DC | PRN

## 2016-08-30 NOTE — Progress Notes (Signed)
08/30/2016  HPI: Patient is status post umbilical hernia repair on 3/13. She had a prior left breast lumpectomy for intraductal papilloma in November 2017 by Dr. Azalee Course. Today she presents for her postop visit. She reports that she is having mild soreness over the umbilical incision but otherwise has been improving and has not had any other issues with the incision. Her appetite is doing well with no fevers or chills no redness or drainage from the incision.  She has had however increasing swelling over the left breast with continued intermittent drainage from the remaining punctate area on the skin just medial to the left nipple. This increased swelling is also associated with some increased soreness and tenderness. She has not had any fevers or chills, and has not felt any new masses or lumps.  Vital signs: BP (!) 171/102   Pulse 74   Temp 97.8 F (36.6 C) (Oral)   Ht 5\' 8"  (1.727 m)   Wt 95 kg (209 lb 6.4 oz)   BMI 31.84 kg/m    Physical Exam: Constitutional: No acute distress Breast: Left breast status post lumpectomy.  There is a persistent punctate wound at the medial corner of the incision with minimal drainage.  There is some increased swelling over the superior mid portion of the breast, just superior to the prior incision.  There are no palpable lesions or new masses. Abdomen:  Supraumbilical incision is clean, dry, intact.  No evidence of infection.  Mild soreness on palpation.  Assessment/Plan: 56 year old female status post umbilical hernia repair as well as a history of left breast lumpectomy for intraductal papilloma.  -From the abdominal standpoint. The wound is doing well with no concerns and she is healing well. She still has 1 more week of no heavy lifting restriction but otherwise may submerge her wound in pool or tub. -From the breast standpoint, given the increased swelling over the left breast, she may have developed a seroma versus an abscess, although there is no  cellulitis over the skin. We will send the patient for a left breast ultrasound with possible aspiration of the fluid. If there is any positive cultures from this fluid, then we will give her an prescription for antibiotics but for now we'll await those results.   Melvyn Neth, [place]

## 2016-08-30 NOTE — Patient Instructions (Signed)
We will send you to [facility] for a Left Breast Ultrasound and possible removal of fluid if a fluid collection exists. I will call you as soon as I get this scheduled. We will set up a follow-up appointment with Dr. Hampton Abbot if needed following the appointment for your Ultrasound. I will call you with the results and plan.  I have sent your Ibuprofen Refill to [place].  In regards to your hernia, it looks amazing! You are healing nicely and there should be no need for further follow-up unless you develop questions or concerns.  Please do not submerge in a tub, hot tub, or pool until incisions are completely sealed.  Use sun block to incision area over the next year if this area will be exposed to sun. This helps decrease scarring.  You may resume your normal activities on 09/06/16. At that time- Listen to your body when lifting, if you have pain when lifting, stop and then try again in a few days. Pain after doing exercises or activities of daily living is normal as you get back in to your normal routine.  If you develop redness, drainage, or pain at incision sites- call our office immediately and speak with a nurse.

## 2016-08-31 ENCOUNTER — Telehealth: Payer: Self-pay

## 2016-08-31 ENCOUNTER — Other Ambulatory Visit: Payer: Self-pay | Admitting: Surgery

## 2016-08-31 DIAGNOSIS — N6320 Unspecified lump in the left breast, unspecified quadrant: Secondary | ICD-10-CM

## 2016-08-31 NOTE — Telephone Encounter (Signed)
Spoke with Aldona Bar in regards to patient needing an Ultrasound of her Left Breast for possible fluid collection and also aspiration if fluid collection is present. She will place orders for Dr. Hampton Abbot and call patient with an appointment. She will advise me when appointment has been made.

## 2016-09-01 NOTE — Telephone Encounter (Signed)
Patient has been scheduled to have her mammogram on 09/12/16.  Patient has been scheduled to follow-up in clinic with Dr. Hampton Abbot on 09/22/16.  She has been made aware of all appointment information.

## 2016-09-12 ENCOUNTER — Ambulatory Visit
Admission: RE | Admit: 2016-09-12 | Discharge: 2016-09-12 | Disposition: A | Discharge status: Home / Self Care | Payer: Medicare HMO | Source: Ambulatory Visit | Attending: Surgery | Admitting: Surgery

## 2016-09-12 DIAGNOSIS — N6320 Unspecified lump in the left breast, unspecified quadrant: Secondary | ICD-10-CM | POA: Diagnosis not present

## 2016-09-12 DIAGNOSIS — N6489 Other specified disorders of breast: Secondary | ICD-10-CM | POA: Diagnosis not present

## 2016-09-12 DIAGNOSIS — R921 Mammographic calcification found on diagnostic imaging of breast: Secondary | ICD-10-CM | POA: Diagnosis not present

## 2016-09-22 ENCOUNTER — Ambulatory Visit: Payer: Self-pay | Admitting: Surgery

## 2016-09-28 ENCOUNTER — Other Ambulatory Visit: Payer: Self-pay | Admitting: *Deleted

## 2016-09-28 MED ORDER — ANASTROZOLE 1 MG PO TABS: 1.0000 mg | ORAL_TABLET | Freq: Every day | ORAL | 0 refills | Status: DC

## 2016-10-10 ENCOUNTER — Ambulatory Visit (INDEPENDENT_AMBULATORY_CARE_PROVIDER_SITE_OTHER): Payer: Medicare HMO | Admitting: Podiatry

## 2016-10-10 DIAGNOSIS — B351 Tinea unguium: Secondary | ICD-10-CM

## 2016-10-10 DIAGNOSIS — Q828 Other specified congenital malformations of skin: Secondary | ICD-10-CM

## 2016-10-10 DIAGNOSIS — M79676 Pain in unspecified toe(s): Secondary | ICD-10-CM | POA: Diagnosis not present

## 2016-10-10 DIAGNOSIS — E119 Type 2 diabetes mellitus without complications: Secondary | ICD-10-CM | POA: Diagnosis not present

## 2016-10-10 NOTE — Progress Notes (Signed)
Subjective:     Patient ID: Donna Riley, female   DOB: 1960-09-17, 56 y.o.   MRN: [id number]  HPI this patient presents the office with chief complaint of painful feet. She says that she has painful nails and painful calluses which she hasn't had worked on for a long period of time. She says she did go to a podiatrist before who used acid and she ended up with severe acid reaction and could not walk for 3 days. She has not returned to his office since that time. She says her nails have grown thick and long and are painful walking and wearing her shoes. Patient has very dry and callus, skin on the bottom of her feet with local areas of severe callus pain. She presents the office today for an evaluation and treatment of her painful feet. She also is a diabetic with a diagnosis of neuropathy   Review of Systems     Objective:   Physical Exam GENERAL APPEARANCE: Alert, conversant. Appropriately groomed. No acute distress.  VASCULAR: Pedal pulses are  palpable at  [facility] and PT bilateral.  Capillary refill time is immediate to all digits,  Normal temperature gradient.   NEUROLOGIC: sensation is normal to 5.07 monofilament at 5/5 sites bilateral.  Light touch is intact bilateral, Muscle strength normal.  MUSCULOSKELETAL: acceptable muscle strength, tone and stability bilateral.  Intrinsic muscluature intact bilateral.  Rectus appearance of foot and digits noted bilateral. DJD midfoot right  DERMATOLOGIC: skin color, texture, and turgor are within normal limits.  No preulcerative lesions or ulcers  are seen, no interdigital maceration noted.  No open lesions present.  Multiple keratotic lesions both feet.  Severe porokeratosis sub 1,5 left and sub 2,5 right.  Painful right heel callus.No drainage noted.  NAILS  Thick disfigured discolored nails both feet.     Assessment:     Onychomycosis  B/L  Porokeratosis  B/L  Diabetes    Plan:     IE  Debridemnt of nails  Debridement of callus. Patient had  severe dry scaly, callus. The skin on the bottom of both feet. In addition to her localized porokeratotic lesions due to the skin. I did recommend that she consider purchasing a paraffin footbath in an effort to help moisten her feet and eliminate the callus. She should return the office in 10 weeks for further evaluation and treatment.  I believe she has a porokeratotic family history being her father and her children also have severe callus  Gardiner Barefoot DPM

## 2016-10-14 ENCOUNTER — Ambulatory Visit (INDEPENDENT_AMBULATORY_CARE_PROVIDER_SITE_OTHER): Payer: Medicare HMO | Admitting: General Surgery

## 2016-10-14 ENCOUNTER — Ambulatory Visit
Admission: RE | Admit: 2016-10-14 | Discharge: 2016-10-14 | Disposition: A | Discharge status: Home / Self Care | Payer: Medicare HMO | Source: Ambulatory Visit | Attending: General Surgery | Admitting: General Surgery

## 2016-10-14 ENCOUNTER — Telehealth: Payer: Self-pay | Admitting: General Practice

## 2016-10-14 ENCOUNTER — Encounter: Payer: Self-pay | Admitting: General Surgery

## 2016-10-14 ENCOUNTER — Telehealth: Payer: Self-pay

## 2016-10-14 VITALS — BP 179/91 | HR 84 | Temp 98.4°F | Ht 68.0 in | Wt 206.2 lb

## 2016-10-14 DIAGNOSIS — N6324 Unspecified lump in the left breast, lower inner quadrant: Secondary | ICD-10-CM | POA: Diagnosis not present

## 2016-10-14 DIAGNOSIS — N61 Mastitis without abscess: Secondary | ICD-10-CM | POA: Diagnosis not present

## 2016-10-14 DIAGNOSIS — N644 Mastodynia: Secondary | ICD-10-CM | POA: Diagnosis not present

## 2016-10-14 MED ORDER — SULFAMETHOXAZOLE-TRIMETHOPRIM 800-160 MG PO TABS: 1.0000 | ORAL_TABLET | Freq: Two times a day (BID) | ORAL | 0 refills | Status: DC

## 2016-10-14 MED ORDER — HYDROCODONE-ACETAMINOPHEN 5-325 MG PO TABS: 1.0000 | ORAL_TABLET | Freq: Four times a day (QID) | ORAL | 0 refills | Status: DC | PRN

## 2016-10-14 NOTE — Telephone Encounter (Signed)
Patient complains of hurting when she bends over on her left breast. Nipple is sore,feels like it the nerves. Patient had a lumpectomy with Dr Azalee Course on 03/11/16.  She is requesting pain medication. Her last visit 08/30/16 with Dr Hampton Abbot.   Patient has several canceled visits.   Please call patient to advise of issues or another follow up appointment.

## 2016-10-14 NOTE — Patient Instructions (Signed)
You are scheduled for a Left Breast Ultrasound at [place] at 1100am. I will call with results and recommendations of this as soon as results are received.  We will start you on Bactrim today. Start this today to help this infection.   We have given you a prescription for pain medication as well. Have this filled at your pharmacy and begin today.  We will see you back in 2 weeks unless your Ultrasound is abnormal.

## 2016-10-14 NOTE — Telephone Encounter (Signed)
Reviewed results of today's Breast Ultrasound with patient. She was instructed to remain on the Bactrim that was called in until it is completed. She will follow-up in office with Dr. Adonis Huguenin in 2 weeks as scheduled. Patient verbalizes understanding.

## 2016-10-14 NOTE — Progress Notes (Signed)
Outpatient Surgical Follow Up  10/14/2016  Donna Riley is an 56 y.o. female.   Chief Complaint  Patient presents with  . Established Patient    Left Breast Drainage and Pain    HPI: 56 year old female who is well known to the surgery department returns to clinic for evaluation of continued drainage and pain to her left breast. Patient reports that the pain and drainage has worsened over the last couple days. The pain is especially worse with bending over or pressure on her breast. The drainage coming from the middle of the incision has increased in quantity and changed from clear to green or the last several days. She has been feeling chilled but denies any fevers. She denies any nausea, vomiting, chest pain, short of breath, diarrhea, constipation. Patient reports that she is continuing to smoke but she is down to less than half a pack from her to pack a day history.  Past Medical History:  Diagnosis Date  . Arthritis    shoulders and neck  . Breast cancer ([place]) 2016    Left breast with chemo + rad tx's with lumpectomy.  . Diabetes mellitus without complication ([place]) 05/7791  . Elevated LFTs   . Hypertension   . Personal history of chemotherapy   . Personal history of radiation therapy   . Positive PPD, treated   . Shortness of breath dyspnea    with exertion    Past Surgical History:  Procedure Laterality Date  . BREAST BIOPSY Left 07/31/2014   positive. Lumpectomy 08/27/2014 with chemo and rad  . BREAST BIOPSY Left 03/08/2016   path pending  . BREAST LUMPECTOMY WITH NEEDLE LOCALIZATION Left 04/05/2016   Procedure: BREAST LUMPECTOMY WITH NEEDLE LOCALIZATION;  Surgeon: Hubbard Robinson, MD;  Location: [facility];  Service: General;  Laterality: Left;  . BREAST LUMPECTOMY WITH NEEDLE LOCALIZATION AND AXILLARY SENTINEL LYMPH NODE BX Left 08/27/14  . CHOLECYSTECTOMY    . COLONOSCOPY WITH PROPOFOL N/A 08/28/2015   Procedure: COLONOSCOPY WITH PROPOFOL;  Surgeon: Lucilla Lame, MD;   Location: [place];  Service: Endoscopy;  Laterality: N/A;  Diabetic oral  . ESOPHAGOGASTRODUODENOSCOPY  2014   gastritis  . HERNIA REPAIR    . POLYPECTOMY  08/28/2015   Procedure: POLYPECTOMY INTESTINAL;  Surgeon: Lucilla Lame, MD;  Location: [place];  Service: Endoscopy;;  Rectal polyp  . TUBAL LIGATION Bilateral   . UMBILICAL HERNIA REPAIR N/A 08/09/2016   Procedure: HERNIA REPAIR UMBILICAL ADULT;  Surgeon: Olean Ree, MD;  Location: [facility];  Service: General;  Laterality: N/A;  . VENTRAL HERNIA REPAIR N/A 04/10/2015   Procedure: HERNIA REPAIR VENTRAL ADULT;  Surgeon: Leonie Green, MD;  Location: [facility];  Service: General;  Laterality: N/A;    Family History  Problem Relation Age of Onset  . Cancer Mother        Pancreatic  . Cancer Father        Throat  . Cancer Maternal Grandmother        Breast  . Breast cancer Maternal Grandmother 75    Social History:  reports that she has been smoking Cigarettes.  She has a 9.50 pack-year smoking history. She has never used smokeless tobacco. She reports that she drinks alcohol. She reports that she does not use drugs.  Allergies: No Known Allergies  Medications reviewed.    ROS A multipoint review of systems was completed, all pertinent positives and negatives are documented in the history of present illness the remainder are  negative   BP (!) 179/91   Pulse 84   Temp 98.4 F (36.9 C) (Oral)   Ht 5\' 8"  (1.727 m)   Wt 93.5 kg (206 lb 3.2 oz)   BMI 31.35 kg/m   Physical Exam Gen.: No acute distress Neck: Supple and nontender Lymph nodes: No evidence of palpable cervical, clavicular, axillary lymphadenopathy Breast: Bilateral breasts examined. Right breast without any pathological malady. Left breast with obvious radiation changes. Increased and see around her 3:00 incision diagnosis up to her nipple areolar complex. On palpation a dark green fluid is able to be expressed from the incision site.  The area is warm and tender to touch.    No results found for this or any previous visit (from the past 48 hour(s)). No results found.  Assessment/Plan:  1. Breast pain, left 56 year old female with persistent left breast pain since left breast lumpectomy that was competent by wound breakdown that was then treated by radiation. Now appears to have purulent fluid draining from the middle of her incision. Discussed that we would provide her with medications for pain relief, start her on antibiotics for what appears to be a superficial infection, and repeat an ultrasound to make sure there are no deeper undrained fluid pockets. She voiced understanding and will follow-up in clinic in 2 weeks for additional follow-up. Given the progression of her symptoms discussed that it is highly likely that she will require an operative intervention to get this to stop. Chronic pain of the breast after surgery is difficult to treat. She voiced understanding and will follow-up in 2 weeks. - US BREAST LTD UNI LEFT INC AXILLA; Future     Clayburn Pert, MD [facility] General Surgeon  10/14/2016,10:53 AM

## 2016-10-14 NOTE — Telephone Encounter (Signed)
Left Breast Pain with movement and bending that feels like a "pulling/burning sensation." Area is still draining at the end of incision. Has tried Gabapentin but did not help. Ibuprofen and Tylenol does not help.  Spoke with Dr. Adonis Huguenin. We will see patient this am and evaluate. She has been placed on schedule and is aware of appointment.

## 2016-10-15 ENCOUNTER — Emergency Department: Payer: Medicare HMO

## 2016-10-15 ENCOUNTER — Encounter: Payer: Self-pay | Admitting: Emergency Medicine

## 2016-10-15 ENCOUNTER — Inpatient Hospital Stay
Admission: EM | Admit: 2016-10-15 | Discharge: 2016-10-17 | DRG: 601 | Disposition: A | Discharge status: Home / Self Care | Payer: Medicare HMO | Attending: Surgery | Admitting: Surgery

## 2016-10-15 DIAGNOSIS — R7611 Nonspecific reaction to tuberculin skin test without active tuberculosis: Secondary | ICD-10-CM | POA: Diagnosis present

## 2016-10-15 DIAGNOSIS — Z923 Personal history of irradiation: Secondary | ICD-10-CM | POA: Diagnosis not present

## 2016-10-15 DIAGNOSIS — N6489 Other specified disorders of breast: Secondary | ICD-10-CM | POA: Diagnosis not present

## 2016-10-15 DIAGNOSIS — Z7984 Long term (current) use of oral hypoglycemic drugs: Secondary | ICD-10-CM | POA: Diagnosis not present

## 2016-10-15 DIAGNOSIS — Z7982 Long term (current) use of aspirin: Secondary | ICD-10-CM | POA: Diagnosis not present

## 2016-10-15 DIAGNOSIS — I10 Essential (primary) hypertension: Secondary | ICD-10-CM | POA: Diagnosis present

## 2016-10-15 DIAGNOSIS — R69 Illness, unspecified: Secondary | ICD-10-CM | POA: Diagnosis not present

## 2016-10-15 DIAGNOSIS — N61 Mastitis without abscess: Secondary | ICD-10-CM

## 2016-10-15 DIAGNOSIS — Z79899 Other long term (current) drug therapy: Secondary | ICD-10-CM

## 2016-10-15 DIAGNOSIS — Z9221 Personal history of antineoplastic chemotherapy: Secondary | ICD-10-CM | POA: Diagnosis not present

## 2016-10-15 DIAGNOSIS — F17210 Nicotine dependence, cigarettes, uncomplicated: Secondary | ICD-10-CM | POA: Diagnosis present

## 2016-10-15 DIAGNOSIS — R509 Fever, unspecified: Secondary | ICD-10-CM | POA: Diagnosis not present

## 2016-10-15 DIAGNOSIS — Z853 Personal history of malignant neoplasm of breast: Secondary | ICD-10-CM

## 2016-10-15 DIAGNOSIS — M1990 Unspecified osteoarthritis, unspecified site: Secondary | ICD-10-CM | POA: Diagnosis present

## 2016-10-15 DIAGNOSIS — E119 Type 2 diabetes mellitus without complications: Secondary | ICD-10-CM | POA: Diagnosis not present

## 2016-10-15 HISTORY — DX: Mastitis without abscess: N61.0

## 2016-10-15 LAB — URINALYSIS, COMPLETE (UACMP) WITH MICROSCOPIC
Bilirubin Urine: NEGATIVE
Glucose, UA: NEGATIVE mg/dL
Ketones, ur: NEGATIVE mg/dL
Leukocytes, UA: NEGATIVE
Nitrite: NEGATIVE
Protein, ur: NEGATIVE mg/dL
Specific Gravity, Urine: 1.013 (ref 1.005–1.030)
pH: 5 (ref 5.0–8.0)

## 2016-10-15 LAB — COMPREHENSIVE METABOLIC PANEL
ALT: 31 U/L (ref 14–54)
AST: 41 U/L (ref 15–41)
Albumin: 4.2 g/dL (ref 3.5–5.0)
Alkaline Phosphatase: 101 U/L (ref 38–126)
Anion gap: 11 (ref 5–15)
BUN: 11 mg/dL (ref 6–20)
CO2: 22 mmol/L (ref 22–32)
Calcium: 9.3 mg/dL (ref 8.9–10.3)
Chloride: 104 mmol/L (ref 101–111)
Creatinine, Ser: 0.89 mg/dL (ref 0.44–1.00)
GFR calc Af Amer: >60 mL/min (ref >60)
GFR calc non Af Amer: >60 mL/min (ref >60)
Glucose, Bld: 107 mg/dL — ABNORMAL HIGH (ref 65–99)
Potassium: 3.6 mmol/L (ref 3.5–5.1)
Sodium: 137 mmol/L (ref 135–145)
Total Bilirubin: 0.3 mg/dL (ref 0.3–1.2)
Total Protein: 7.9 g/dL (ref 6.5–8.1)

## 2016-10-15 LAB — CBC WITH DIFFERENTIAL/PLATELET
Basophils Absolute: 0 10*3/uL (ref 0–0.1)
Basophils Relative: 1 %
Eosinophils Absolute: 0 10*3/uL (ref 0–0.7)
Eosinophils Relative: 0 %
HCT: 41.7 % (ref 35.0–47.0)
Hemoglobin: 14 g/dL (ref 12.0–16.0)
Lymphocytes Relative: 7 %
Lymphs Abs: 0.5 10*3/uL — ABNORMAL LOW (ref 1.0–3.6)
MCH: 27.1 pg (ref 26.0–34.0)
MCHC: 33.5 g/dL (ref 32.0–36.0)
MCV: 81 fL (ref 80.0–100.0)
Monocytes Absolute: 0.2 10*3/uL (ref 0.2–0.9)
Monocytes Relative: 2 %
Neutro Abs: 7.1 10*3/uL — ABNORMAL HIGH (ref 1.4–6.5)
Neutrophils Relative %: 90 %
Platelets: 191 10*3/uL (ref 150–440)
RBC: 5.15 MIL/uL (ref 3.80–5.20)
RDW: 13.8 % (ref 11.5–14.5)
WBC: 7.8 10*3/uL (ref 3.6–11.0)

## 2016-10-15 LAB — PROTIME-INR
INR: 1
Prothrombin Time: 13.2 seconds (ref 11.4–15.2)

## 2016-10-15 LAB — LACTIC ACID, PLASMA: Lactic Acid, Venous: 1.9 mmol/L (ref 0.5–1.9)

## 2016-10-15 LAB — GLUCOSE, CAPILLARY: Glucose-Capillary: 130 mg/dL — ABNORMAL HIGH (ref 65–99)

## 2016-10-15 LAB — LIPASE, BLOOD: Lipase: 26 U/L (ref 11–51)

## 2016-10-15 MED ORDER — IBUPROFEN 400 MG PO TABS
600.0000 mg | ORAL_TABLET | Freq: Three times a day (TID) | ORAL | Status: DC | PRN
  Administered 2016-10-15: 600 mg via ORAL
  Filled 2016-10-15: qty 2

## 2016-10-15 MED ORDER — ONDANSETRON HCL 4 MG PO TABS: 4.0000 mg | ORAL_TABLET | Freq: Four times a day (QID) | ORAL | Status: DC | PRN

## 2016-10-15 MED ORDER — ONDANSETRON HCL 4 MG/2ML IJ SOLN: 4.0000 mg | Freq: Four times a day (QID) | INTRAMUSCULAR | Status: DC | PRN

## 2016-10-15 MED ORDER — SODIUM CHLORIDE 0.9 % IV SOLN
3.0000 g | Freq: Four times a day (QID) | INTRAVENOUS | Status: DC
  Administered 2016-10-15 – 2016-10-17 (×8): 3 g via INTRAVENOUS
  Filled 2016-10-15 (×12): qty 3

## 2016-10-15 MED ORDER — ONDANSETRON HCL 4 MG/2ML IJ SOLN
4.0000 mg | Freq: Once | INTRAMUSCULAR | Status: AC
  Administered 2016-10-15: 4 mg via INTRAVENOUS
  Filled 2016-10-15: qty 2

## 2016-10-15 MED ORDER — VANCOMYCIN HCL IN DEXTROSE 1-5 GM/200ML-% IV SOLN
1000.0000 mg | Freq: Once | INTRAVENOUS | Status: AC
Start: 2016-10-15 — End: 2016-10-15
  Administered 2016-10-15: 1000 mg via INTRAVENOUS
  Filled 2016-10-15: qty 200

## 2016-10-15 MED ORDER — HYDROCODONE-ACETAMINOPHEN 5-325 MG PO TABS: 1.0000 | ORAL_TABLET | Freq: Four times a day (QID) | ORAL | Status: DC | PRN

## 2016-10-15 MED ORDER — METFORMIN HCL 500 MG PO TABS
500.0000 mg | ORAL_TABLET | Freq: Every morning | ORAL | Status: DC
  Administered 2016-10-16 – 2016-10-17 (×2): 500 mg via ORAL
  Filled 2016-10-15 (×2): qty 1

## 2016-10-15 MED ORDER — AMLODIPINE BESYLATE 5 MG PO TABS
5.0000 mg | ORAL_TABLET | Freq: Every day | ORAL | Status: DC
  Administered 2016-10-15 – 2016-10-17 (×3): 5 mg via ORAL
  Filled 2016-10-15 (×3): qty 1

## 2016-10-15 MED ORDER — MORPHINE SULFATE (PF) 2 MG/ML IV SOLN: 2.0000 mg | INTRAVENOUS | Status: DC | PRN

## 2016-10-15 MED ORDER — LISINOPRIL 20 MG PO TABS
40.0000 mg | ORAL_TABLET | Freq: Every day | ORAL | Status: DC
  Administered 2016-10-16 – 2016-10-17 (×2): 40 mg via ORAL
  Filled 2016-10-15 (×2): qty 2

## 2016-10-15 MED ORDER — ACETAMINOPHEN 500 MG PO TABS
1000.0000 mg | ORAL_TABLET | Freq: Once | ORAL | Status: AC
  Administered 2016-10-15: 1000 mg via ORAL
  Filled 2016-10-15: qty 2

## 2016-10-15 MED ORDER — LACTATED RINGERS IV SOLN
INTRAVENOUS | Status: DC
  Administered 2016-10-15 – 2016-10-17 (×5): via INTRAVENOUS

## 2016-10-15 MED ORDER — HEPARIN SODIUM (PORCINE) 5000 UNIT/ML IJ SOLN
5000.0000 [IU] | Freq: Three times a day (TID) | INTRAMUSCULAR | Status: DC
  Administered 2016-10-15 – 2016-10-17 (×5): 5000 [IU] via SUBCUTANEOUS
  Filled 2016-10-15 (×5): qty 1

## 2016-10-15 MED ORDER — ANASTROZOLE 1 MG PO TABS
1.0000 mg | ORAL_TABLET | Freq: Every day | ORAL | Status: DC
  Administered 2016-10-15 – 2016-10-17 (×3): 1 mg via ORAL
  Filled 2016-10-15 (×3): qty 1

## 2016-10-15 MED ORDER — ASPIRIN EC 81 MG PO TBEC
81.0000 mg | DELAYED_RELEASE_TABLET | Freq: Every day | ORAL | Status: DC
  Administered 2016-10-15 – 2016-10-17 (×3): 81 mg via ORAL
  Filled 2016-10-15 (×3): qty 1

## 2016-10-15 MED ORDER — ACETAMINOPHEN 500 MG PO TABS
1000.0000 mg | ORAL_TABLET | Freq: Four times a day (QID) | ORAL | Status: DC | PRN
  Administered 2016-10-15 – 2016-10-16 (×2): 1000 mg via ORAL
  Filled 2016-10-15 (×2): qty 2

## 2016-10-15 MED ORDER — CEFTRIAXONE SODIUM IN DEXTROSE 20 MG/ML IV SOLN
1.0000 g | Freq: Once | INTRAVENOUS | Status: AC
  Administered 2016-10-15: 1 g via INTRAVENOUS
  Filled 2016-10-15: qty 50

## 2016-10-15 NOTE — ED Triage Notes (Signed)
Pt presents to ED with c/o chills, generalized body aches, fever, and nausea. Pt states was seen by Dr. Adonis Huguenin yesterday for an infection in her breast, states was started on Bactrim yesterday. Pt states last night she began having the chills, aches, and nausea with vomiting last night.

## 2016-10-15 NOTE — ED Notes (Signed)
Pt c/o nipple discharge and states she was seen by Dr. Adonis Huguenin yesterday and started on Bactrim, states she started running a fever and vomiting. Pt also c/o generalized body aches. Pt is alert and oriented, family at bedside.

## 2016-10-15 NOTE — ED Notes (Signed)
Returned from XR 

## 2016-10-15 NOTE — ED Provider Notes (Signed)
[facility] Emergency Department Provider Note   ____________________________________________   First MD Initiated Contact with Patient 10/15/16 1236     (approximate)  I have reviewed the triage vital signs and the nursing notes.   HISTORY  Chief Complaint Medication Reaction    HPI Donna Riley is a 56 y.o. female patient reports several days of pain and swelling in the left breast. Went to see Dr. Lanetta Inch yesterday. Was put on Bactrim. Last night she began having chills and aches nausea and vomiting and fever. There is some pus drainage from the left breast. Patient has had breast cancer and that breast and a plugged duct apparently. She has chronic retraction of the area: In that area. That is where the pussy drainage is coming from.   Past Medical History:  Diagnosis Date  . Arthritis    shoulders and neck  . Breast cancer ([place]) 2016    Left breast with chemo + rad tx's with lumpectomy.  . Diabetes mellitus without complication ([place]) 05/256  . Elevated LFTs   . Hypertension   . Personal history of chemotherapy   . Personal history of radiation therapy   . Positive PPD, treated   . Shortness of breath dyspnea    with exertion    Patient Active Problem List   Diagnosis Date Noted  . Breast pain, left 10/14/2016  . Arthritis   . Elevated LFTs   . Personal history of chemotherapy   . Hypertension   . Positive PPD, treated   . Personal history of radiation therapy   . Fatigue 08/01/2016  . Umbilical hernia without obstruction and without gangrene 07/13/2016  . Breast wound, left, sequela 06/23/2016  . Special screening for malignant neoplasms, colon   . Rectal polyp   . Irritable bowel syndrome with both constipation and diarrhea 07/20/2015  . Controlled type 2 diabetes mellitus without complication, without long-term current use of insulin ([place]) 06/22/2015  . Pre-ulcerative calluses 06/22/2015  . Neuropathy 06/22/2015  . Hot flash,  menopausal 04/09/2015  . Essential (primary) hypertension 03/25/2015  . 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 Osteopenia 03/12/2015  . Hydradenitis 01/23/2015  . Diabetes mellitus without complication ([place]) 52/77/8242  . Breast cancer ([place]) 05/30/2014  . 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 BREAST BIOPSY Left 07/31/2014   positive. Lumpectomy 08/27/2014 with chemo and rad  . BREAST BIOPSY Left 03/08/2016   path pending  . BREAST LUMPECTOMY WITH NEEDLE LOCALIZATION Left 04/05/2016   Procedure: BREAST LUMPECTOMY WITH NEEDLE LOCALIZATION;  Surgeon: Hubbard Robinson, MD;  Location: [facility];  Service: General;  Laterality: Left;  . BREAST LUMPECTOMY WITH NEEDLE LOCALIZATION AND AXILLARY SENTINEL LYMPH NODE BX Left 08/27/14  . CHOLECYSTECTOMY    . COLONOSCOPY WITH PROPOFOL N/A 08/28/2015   Procedure: COLONOSCOPY WITH PROPOFOL;  Surgeon: Lucilla Lame, MD;  Location: [place];  Service: Endoscopy;  Laterality: N/A;  Diabetic oral  . ESOPHAGOGASTRODUODENOSCOPY  2014   gastritis  . HERNIA REPAIR    . POLYPECTOMY  08/28/2015   Procedure: POLYPECTOMY INTESTINAL;  Surgeon: Lucilla Lame, MD;  Location: [place];  Service: Endoscopy;;  Rectal polyp  . TUBAL LIGATION Bilateral   . UMBILICAL HERNIA REPAIR N/A 08/09/2016   Procedure: HERNIA REPAIR UMBILICAL ADULT;  Surgeon: Olean Ree, MD;  Location: [facility];  Service: General;  Laterality: N/A;  . VENTRAL HERNIA REPAIR N/A 04/10/2015   Procedure: HERNIA REPAIR  VENTRAL ADULT;  Surgeon: Leonie Green, MD;  Location: [facility];  Service: General;  Laterality: N/A;    Prior to Admission medications   Medication Sig Start Date End Date Taking? Authorizing Provider  amLODipine (NORVASC) 5 MG tablet TAKE 1 TABLET EVERY DAY 03/18/16   Glean Hess, MD  anastrozole (ARIMIDEX) 1 MG tablet Take 1 tablet (1 mg total) by mouth daily. 09/28/16   Lequita Asal,  MD  aspirin EC 81 MG tablet Take 81 mg by mouth daily.    [provider]  HYDROcodone-acetaminophen (NORCO) 5-325 MG tablet Take 1 tablet by mouth every 6 (six) hours as needed for moderate pain. 10/14/16   Clayburn Pert, MD  ibuprofen (ADVIL,MOTRIN) 600 MG tablet Take 1 tablet (600 mg total) by mouth every 8 (eight) hours as needed for fever or mild pain. 08/30/16   Olean Ree, MD  lisinopril (PRINIVIL,ZESTRIL) 40 MG tablet Take 1 tablet (40 mg total) by mouth daily. Patient taking differently: Take 40 mg by mouth every morning.  06/22/15   Glean Hess, MD  metFORMIN (GLUCOPHAGE) 500 MG tablet Take 1 tablet (500 mg total) by mouth every morning. 08/04/15   Glean Hess, MD  sulfamethoxazole-trimethoprim (BACTRIM DS,SEPTRA DS) 800-160 MG tablet Take 1 tablet by mouth 2 (two) times daily. 10/14/16   Clayburn Pert, MD  Vitamin D, Cholecalciferol, 1000 units CAPS Take 1,000 Units by mouth daily.     [provider]    Allergies Patient has no known allergies.  Family History  Problem Relation Age of Onset  . Cancer Mother        Pancreatic  . Cancer Father        Throat  . Cancer Maternal Grandmother        Breast  . Breast cancer Maternal Grandmother 75    Social History Social History  Substance Use Topics  . Smoking status: Current Every Day Smoker    Packs/day: 0.25    Years: 38.00    Types: Cigarettes  . Smokeless tobacco: Never Used  . Alcohol use Yes     Comment: Occasionally     Review of Systems  Constitutional:fever/chills Eyes: No visual changes. ENT: No sore throat. Cardiovascular: Denies chest pain. Respiratory: Denies shortness of breath. Gastrointestinal:Epigastric abdominal pain.  nausea,  vomiting.  No diarrhea.  No constipation. Genitourinary: Negative for dysuria. Musculoskeletal: Negative for back pain. Skin: Negative for rash. Neurological: Negative for headaches, focal weakness or  numbness.   ____________________________________________   PHYSICAL EXAM:  VITAL SIGNS: ED Triage Vitals  Enc Vitals Group     BP 10/15/16 1212 (!) 164/87     Pulse Rate 10/15/16 1212 (!) 103     Resp 10/15/16 1212 20     Temp 10/15/16 1212 (!) 101.7 F (38.7 C)     Temp Source 10/15/16 1212 Oral     SpO2 10/15/16 1212 99 %     Weight 10/15/16 1207 206 lb (93.4 kg)     Height 10/15/16 1207 5\' 7"  (1.702 m)     Head Circumference --      Peak Flow --      Pain Score 10/15/16 1207 10     Pain Loc --      Pain Edu? --      Excl. in [place]? --     Constitutional: Alert and oriented. ill appearing and in  acute distress. Eyes: Conjunctivae are normal. PERRL. EOMI. Head: Atraumatic. Nose: No congestion/rhinnorhea. Mouth/Throat: Mucous membranes are  moist.  Oropharynx non-erythematous. Neck: No stridor. Cardiovascular: Normal rate, regular rhythm. Grossly normal heart sounds.  Good peripheral circulation. Left breast as described in history of present illness or some retraction in the medial part of the area lot with some pus draining from that. It is tender. It is not particularly red or firm or swollen. Respiratory: Normal respiratory effort.  No retractions. Lungs CTAB. Gastrointestinal: Soft tender in the mid upper abdomen. No distention. No abdominal bruits. No CVA tenderness. Musculoskeletal: No lower extremity tenderness nor edema.  No joint effusions. Neurologic:  Normal speech and language. No gross focal neurologic deficits are appreciated. No gait instability. Skin:  Skin is warm, dry and intact. No rash noted. Psychiatric: Mood and affect are normal. Speech and behavior are normal.  ____________________________________________   LABS (all labs ordered are listed, but only abnormal results are displayed)  Labs Reviewed  COMPREHENSIVE METABOLIC PANEL - Abnormal; Notable for the following:       Result Value   Glucose, Bld 107 (*)    All other components within normal  limits  CBC WITH DIFFERENTIAL/PLATELET - Abnormal; Notable for the following:    Neutro Abs 7.1 (*)    Lymphs Abs 0.5 (*)    All other components within normal limits  GLUCOSE, CAPILLARY - Abnormal; Notable for the following:    Glucose-Capillary 130 (*)    All other components within normal limits  CULTURE, BLOOD (ROUTINE X 2)  CULTURE, BLOOD (ROUTINE X 2)  LACTIC ACID, PLASMA  PROTIME-INR  LACTIC ACID, PLASMA  URINALYSIS, COMPLETE (UACMP) WITH MICROSCOPIC  LIPASE, BLOOD  CBG MONITORING, ED   ____________________________________________  EKG   ____________________________________________  RADIOLOGY  Study Result   CLINICAL DATA:  Vomiting and fever since last night w/ slight mid abdominal pain. No chest complaints at this time. Hx - Left breast cancer lumpectomy 2016 AND 2017 w/ radiation and chemo, HTN, diabetes, current smoker 0.5 ppd  EXAM: CHEST  2 VIEW  COMPARISON:  None.  FINDINGS: Cardiac silhouette is normal in size. Normal mediastinal and hilar contours.  Clear lungs.  No pleural effusion or pneumothorax.  There are changes from left breast surgery.  Skeletal structures are intact.  IMPRESSION: No active cardiopulmonary disease.   Electronically Signed   By: Lajean Manes M.D.   On: 10/15/2016 13:06     ____________________________________________   PROCEDURES  Procedure(s) performed:   Procedures  Critical Care performed: ____________________________________________   INITIAL IMPRESSION / ASSESSMENT AND PLAN / ED COURSE  Pertinent labs & imaging results that were available during my care of the patient were reviewed by me and considered in my medical decision making (see chart for details).        ____________________________________________   FINAL CLINICAL IMPRESSION(S) / ED DIAGNOSES  Final diagnoses:  Fever, unspecified fever cause   infection  NEW MEDICATIONS STARTED DURING THIS VISIT:  New  Prescriptions   No medications on file     Note:  This document was prepared using Dragon voice recognition software and may include unintentional dictation errors.    Nena Polio, MD 10/15/16 [id number]

## 2016-10-15 NOTE — ED Notes (Signed)
Dr. Cinda Quest ok'd to start atbx before obtained urine sample. Offered toileting unable to go at this time

## 2016-10-15 NOTE — H&P (Signed)
Donna Riley is an 56 y.o. female.    Chief Complaint: Left breast pain  HPI: This a patient with left breast pain. She was seen in the emergency room with left breast pain but was seen in the office yesterday by by Dr. Adonis Huguenin with ongoing left breast pain. But now she's having some fevers and some chills. She was febrile in the ED. She is also had what was at one time service drainage and now is greenish colored drainage from her incision from prior partial mastectomy and breast conservation therapy for breast cancer.  Patient reports that she had drainage and an open wound treated after her prior surgery for infection.  Her prior surgeries abdomen performed by Dr.  Azalee Course. She had a breast conservation therapy followed by radiation and then she had excision of an intraductal papilloma of the left breast as well.  Past Medical History:  Diagnosis Date  . Arthritis    shoulders and neck  . Breast cancer ([place]) 2016    Left breast with chemo + rad tx's with lumpectomy.  . Diabetes mellitus without complication ([place]) 08/979  . Elevated LFTs   . Hypertension   . Personal history of chemotherapy   . Personal history of radiation therapy   . Positive PPD, treated   . Shortness of breath dyspnea    with exertion    Past Surgical History:  Procedure Laterality Date  . BREAST BIOPSY Left 07/31/2014   positive. Lumpectomy 08/27/2014 with chemo and rad  . BREAST BIOPSY Left 03/08/2016   path pending  . BREAST LUMPECTOMY WITH NEEDLE LOCALIZATION Left 04/05/2016   Procedure: BREAST LUMPECTOMY WITH NEEDLE LOCALIZATION;  Surgeon: Hubbard Robinson, MD;  Location: [facility];  Service: General;  Laterality: Left;  . BREAST LUMPECTOMY WITH NEEDLE LOCALIZATION AND AXILLARY SENTINEL LYMPH NODE BX Left 08/27/14  . CHOLECYSTECTOMY    . COLONOSCOPY WITH PROPOFOL N/A 08/28/2015   Procedure: COLONOSCOPY WITH PROPOFOL;  Surgeon: Lucilla Lame, MD;  Location: [facility];  Service: Endoscopy;   Laterality: N/A;  Diabetic oral  . ESOPHAGOGASTRODUODENOSCOPY  2014   gastritis  . HERNIA REPAIR    . POLYPECTOMY  08/28/2015   Procedure: POLYPECTOMY INTESTINAL;  Surgeon: Lucilla Lame, MD;  Location: [facility];  Service: Endoscopy;;  Rectal polyp  . TUBAL LIGATION Bilateral   . UMBILICAL HERNIA REPAIR N/A 08/09/2016   Procedure: HERNIA REPAIR UMBILICAL ADULT;  Surgeon: Olean Ree, MD;  Location: [facility];  Service: General;  Laterality: N/A;  . VENTRAL HERNIA REPAIR N/A 04/10/2015   Procedure: HERNIA REPAIR VENTRAL ADULT;  Surgeon: Leonie Green, MD;  Location: [facility];  Service: General;  Laterality: N/A;    Family History  Problem Relation Age of Onset  . Cancer Mother        Pancreatic  . Cancer Father        Throat  . Cancer Maternal Grandmother        Breast  . Breast cancer Maternal Grandmother 75   Social History:  reports that she has been smoking Cigarettes.  She has a 9.50 pack-year smoking history. She has never used smokeless tobacco. She reports that she drinks alcohol. She reports that she does not use drugs.  Allergies: No Known Allergies   (Not in a hospital admission)   Review of Systems  Constitutional: Positive for chills and fever.  HENT: Negative.   Eyes: Negative.   Respiratory: Negative.   Cardiovascular: Negative.   Gastrointestinal: Negative.   Genitourinary:  Negative.   Musculoskeletal: Negative.   Skin: Negative.   Neurological: Negative.   Endo/Heme/Allergies: Negative.   Psychiatric/Behavioral: Negative.      Physical Exam:  BP (!) 162/74   Pulse 96   Temp (!) 100.4 F (38 C)   Resp (!) 28   Ht _0  (1.702 m)   Wt 206 lb (93.4 kg)   SpO2 92%   BMI 32.26 kg/m   Physical Exam  Constitutional: She is oriented to person, place, and time and well-developed, well-nourished, and in no distress. No distress.  Appear somewhat angry  HENT:  Head: Normocephalic and atraumatic.  Eyes: Pupils are equal, round, and  reactive to light. Right eye exhibits no discharge. Left eye exhibits no discharge. No scleral icterus.  Neck: Normal range of motion. No JVD present.  Cardiovascular: Normal rate and normal heart sounds.   Pulmonary/Chest: Effort normal. No respiratory distress.  Abdominal: Soft. She exhibits no distension. There is no tenderness.  Musculoskeletal: Normal range of motion. She exhibits no edema or tenderness.  Lymphadenopathy:    She has no cervical adenopathy.  Neurological: She is alert and oriented to person, place, and time.  Skin: Skin is warm. She is not diaphoretic. There is erythema.  Left breast demonstrates a chronically scarred verified in inverted nipple air the upper quadrant scar. There is a punctate drainage site medial and the scar. I cannot express any drainage at this point but there is evidence of prior purulent drainage. With crustiness.  Psychiatric: Mood and affect normal.  Vitals reviewed.       Results for orders placed or performed during the hospital encounter of 10/15/16 (from the past 48 hour(s))  Comprehensive metabolic panel     Status: Abnormal   Collection Time: 10/15/16 12:14 PM  Result Value Ref Range   Sodium 137 135 - 145 mmol/L   Potassium 3.6 3.5 - 5.1 mmol/L   Chloride 104 101 - 111 mmol/L   CO2 22 22 - 32 mmol/L   Glucose, Bld 107 (H) 65 - 99 mg/dL   BUN 11 6 - 20 mg/dL   Creatinine, Ser 0.89 0.44 - 1.00 mg/dL   Calcium 9.3 8.9 - 10.3 mg/dL   Total Protein 7.9 6.5 - 8.1 g/dL   Albumin 4.2 3.5 - 5.0 g/dL   AST 41 15 - 41 U/L   ALT 31 14 - 54 U/L   Alkaline Phosphatase 101 38 - 126 U/L   Total Bilirubin 0.3 0.3 - 1.2 mg/dL   GFR calc non Af Amer >60 >60 mL/min   GFR calc Af Amer >60 >60 mL/min    Comment: (NOTE) The eGFR has been calculated using the CKD EPI equation. This calculation has not been validated in all clinical situations. eGFR's persistently <60 mL/min signify possible Chronic Kidney Disease.    Anion gap 11 5 - 15   Lactic acid, plasma     Status: None   Collection Time: 10/15/16 12:14 PM  Result Value Ref Range   Lactic Acid, Venous 1.9 0.5 - 1.9 mmol/L  CBC with Differential     Status: Abnormal   Collection Time: 10/15/16 12:14 PM  Result Value Ref Range   WBC 7.8 3.6 - 11.0 K/uL   RBC 5.15 3.80 - 5.20 MIL/uL   Hemoglobin 14.0 12.0 - 16.0 g/dL   HCT 41.7 35.0 - 47.0 %   MCV 81.0 80.0 - 100.0 fL   MCH 27.1 26.0 - 34.0 pg   MCHC 33.5 32.0 - 36.0  g/dL   RDW 13.8 11.5 - 14.5 %   Platelets 191 150 - 440 K/uL   Neutrophils Relative % 90 %   Neutro Abs 7.1 (H) 1.4 - 6.5 K/uL   Lymphocytes Relative 7 %   Lymphs Abs 0.5 (L) 1.0 - 3.6 K/uL   Monocytes Relative 2 %   Monocytes Absolute 0.2 0.2 - 0.9 K/uL   Eosinophils Relative 0 %   Eosinophils Absolute 0.0 0 - 0.7 K/uL   Basophils Relative 1 %   Basophils Absolute 0.0 0 - 0.1 K/uL  Protime-INR     Status: None   Collection Time: 10/15/16 12:14 PM  Result Value Ref Range   Prothrombin Time 13.2 11.4 - 15.2 seconds   INR 1.00   Lipase, blood     Status: None   Collection Time: 10/15/16 12:14 PM  Result Value Ref Range   Lipase 26 11 - 51 U/L  Glucose, capillary     Status: Abnormal   Collection Time: 10/15/16  1:06 PM  Result Value Ref Range   Glucose-Capillary 130 (H) 65 - 99 mg/dL   Dg Chest 2 View  Result Date: 10/15/2016 CLINICAL DATA:  Vomiting and fever since last night w/ slight mid abdominal pain. No chest complaints at this time. Hx - Left breast cancer lumpectomy 2016 AND 2017 w/ radiation and chemo, HTN, diabetes, current smoker 0.5 ppd EXAM: CHEST  2 VIEW COMPARISON:  None. FINDINGS: Cardiac silhouette is normal in size. Normal mediastinal and hilar contours. Clear lungs. No pleural effusion or pneumothorax. There are changes from left breast surgery. Skeletal structures are intact. IMPRESSION: No active cardiopulmonary disease. Electronically Signed   By: Lajean Manes M.D.   On: 10/15/2016 13:06   US Breast Ltd Uni Left Inc  Axilla  Result Date: 10/14/2016 CLINICAL DATA:  55 year old patient presents for evaluation of an drainage near a medial periareolar surgical site. She had an excisional biopsy for left breast intraductal papilloma in November 2017. The patient also has a history of left breast invasive carcinoma for which she had lumpectomy, radiation and chemotherapy in 2016. The patient was recently evaluated on 09/12/2016 in our department, and no focal fluid collection was identified at the surgical site to explain the patient's drainage. She presents today for ultrasound only of the left breast to evaluate left breast pain and persistent drainage. EXAM: ULTRASOUND OF THE LEFT BREAST COMPARISON:  Previous exam(s). FINDINGS: On physical exam, the skin appears normal in color. No visible evidence of diffuse skin thickening, erythema, or skin darkening to suggest mastitis. There is a periareolar surgical scar in the 11-12 o'clock region of the left breast, where the patient describes she has had some drainage. I palpate some probable scar tissue in this region. Targeted ultrasound is performed, showing focally skin thickened skin at the level of the cutaneous scar. No diffuse skin thickening is identified to suggest mastitis. Just deep to the cutaneous scar in the periareolar left breast is linear hypoechogenicity consistent with postsurgical scarring. No focal fluid collection is identified. No evidence of abscess. No bands of edema are identified to suggest mastitis. Ultrasound in the region of the prior lumpectomy site at 9 o'clock position shows sonographic findings compatible with benign fat necrosis, as visualized in this region on prior mammograms. No fluid collections are seen in the region of prior lumpectomy. IMPRESSION: No focal fluid collection, abscess, or breast edema is identified in the periareolar/retroareolar left breast in the region of prior excisional biopsy in 2017. Benign fat  necrosis is seen at the site  of prior lumpectomy and 9 o'clock left breast. No imaging findings to suggest infection. RECOMMENDATION: Clinical follow-up. Annual mammography is recommended. The patient will be due for her bilateral diagnostic mammogram in April 2019. I have discussed the findings and recommendations with the patient. Results were also provided in writing at the conclusion of the visit. If applicable, a reminder letter will be sent to the patient regarding the next appointment. BI-RADS CATEGORY  2: Benign. Electronically Signed   By: Curlene Dolphin M.D.   On: 10/14/2016 12:07     Assessment/Plan  This patient presents with pain and fever in the left breast a year after breast conservation therapy. She was seen in the emergency room today and in the office yesterday by Dr. Adonis Huguenin wary she was placed on antibiotics but then she started having fevers. She's had increasing pain and drainage of purulent material from the left breast incision scar.  Because the patient has failed outpatient management and is now febrile I would recommend admitting the patient to the hospital I will review her ultrasound done on Thursday and possibly repeat it but I see no obvious signs of fluctuance at this point that would necessitate surgical drainage. However this continues drainage and open debridement would be indicated. IV antibiotics are indicated in this patient was discussed with Dr. Rip Harbour the patient and her husband.  Florene Glen, MD, FACS

## 2016-10-15 NOTE — ED Notes (Signed)
Megan RN notified Dr. Cinda Quest of patient being possible sepsis patient.

## 2016-10-16 LAB — COMPREHENSIVE METABOLIC PANEL
ALT: 47 U/L (ref 14–54)
AST: 55 U/L — ABNORMAL HIGH (ref 15–41)
Albumin: 3.3 g/dL — ABNORMAL LOW (ref 3.5–5.0)
Alkaline Phosphatase: 82 U/L (ref 38–126)
Anion gap: 8 (ref 5–15)
BUN: 11 mg/dL (ref 6–20)
CO2: 23 mmol/L (ref 22–32)
Calcium: 8.7 mg/dL — ABNORMAL LOW (ref 8.9–10.3)
Chloride: 107 mmol/L (ref 101–111)
Creatinine, Ser: 0.82 mg/dL (ref 0.44–1.00)
GFR calc Af Amer: >60 mL/min (ref >60)
GFR calc non Af Amer: >60 mL/min (ref >60)
Glucose, Bld: 106 mg/dL — ABNORMAL HIGH (ref 65–99)
Potassium: 3.1 mmol/L — ABNORMAL LOW (ref 3.5–5.1)
Sodium: 138 mmol/L (ref 135–145)
Total Bilirubin: 0.4 mg/dL (ref 0.3–1.2)
Total Protein: 6.5 g/dL (ref 6.5–8.1)

## 2016-10-16 LAB — SURGICAL PCR SCREEN
MRSA, PCR: NEGATIVE
Staphylococcus aureus: NEGATIVE

## 2016-10-16 LAB — GLUCOSE, CAPILLARY
Glucose-Capillary: 108 mg/dL — ABNORMAL HIGH (ref 65–99)
Glucose-Capillary: 107 mg/dL — ABNORMAL HIGH (ref 65–99)
Glucose-Capillary: 101 mg/dL — ABNORMAL HIGH (ref 65–99)
Glucose-Capillary: 91 mg/dL (ref 65–99)

## 2016-10-16 NOTE — Progress Notes (Signed)
CC: Left mastitis Subjective: Patient states that her pain is completely gone today she's had no further drainage and she had a last fever last night approximately 8:00. Also feels much better today no nausea vomiting and no fevers  Objective: Vital signs in last 24 hours: Temp:  [97.7 F (36.5 C)-101.8 F (38.8 C)] 98 F (36.7 C) (05/20 0540) Pulse Rate:  [59-106] 59 (05/20 0540) Resp:  [16-34] 18 (05/20 0540) BP: (120-195)/(54-87) 120/55 (05/20 0540) SpO2:  [92 %-99 %] 98 % (05/20 0540) Weight:  [206 lb (93.4 kg)] 206 lb (93.4 kg) (05/19 1207) Last BM Date: 10/14/16  Intake/Output from previous day: 05/19 0701 - 05/20 0700 In: 2512.5 [P.O.:240; I.V.:1972.5; IV Piggyback:300] Out: 950 [Urine:950] Intake/Output this shift: No intake/output data recorded.  Physical exam:  Erythema is slightly improved no expressible purulence no mass effect to suggest fluctuance.  Lab Results: CBC   Recent Labs  10/15/16 1214  WBC 7.8  HGB 14.0  HCT 41.7  PLT 191   BMET  Recent Labs  10/15/16 1214 10/16/16 0435  NA 137 138  K 3.6 3.1*  CL 104 107  CO2 22 23  GLUCOSE 107* 106*  BUN 11 11  CREATININE 0.89 0.82  CALCIUM 9.3 8.7*   PT/INR  Recent Labs  10/15/16 1214  LABPROT 13.2  INR 1.00   ABG No results for input(s): PHART, HCO3 in the last 72 hours.  Invalid input(s): PCO2, PO2  Studies/Results: Dg Chest 2 View  Result Date: 10/15/2016 CLINICAL DATA:  Vomiting and fever since last night w/ slight mid abdominal pain. No chest complaints at this time. Hx - Left breast cancer lumpectomy 2016 AND 2017 w/ radiation and chemo, HTN, diabetes, current smoker 0.5 ppd EXAM: CHEST  2 VIEW COMPARISON:  None. FINDINGS: Cardiac silhouette is normal in size. Normal mediastinal and hilar contours. Clear lungs. No pleural effusion or pneumothorax. There are changes from left breast surgery. Skeletal structures are intact. IMPRESSION: No active cardiopulmonary disease.  Electronically Signed   By: Lajean Manes M.D.   On: 10/15/2016 13:06   US Breast Ltd Uni Left Inc Axilla  Result Date: 10/14/2016 CLINICAL DATA:  56 year old patient presents for evaluation of an drainage near a medial periareolar surgical site. She had an excisional biopsy for left breast intraductal papilloma in November 2017. The patient also has a history of left breast invasive carcinoma for which she had lumpectomy, radiation and chemotherapy in 2016. The patient was recently evaluated on 09/12/2016 in our department, and no focal fluid collection was identified at the surgical site to explain the patient's drainage. She presents today for ultrasound only of the left breast to evaluate left breast pain and persistent drainage. EXAM: ULTRASOUND OF THE LEFT BREAST COMPARISON:  Previous exam(s). FINDINGS: On physical exam, the skin appears normal in color. No visible evidence of diffuse skin thickening, erythema, or skin darkening to suggest mastitis. There is a periareolar surgical scar in the 11-12 o'clock region of the left breast, where the patient describes she has had some drainage. I palpate some probable scar tissue in this region. Targeted ultrasound is performed, showing focally skin thickened skin at the level of the cutaneous scar. No diffuse skin thickening is identified to suggest mastitis. Just deep to the cutaneous scar in the periareolar left breast is linear hypoechogenicity consistent with postsurgical scarring. No focal fluid collection is identified. No evidence of abscess. No bands of edema are identified to suggest mastitis. Ultrasound in the region of the prior lumpectomy  site at 9 o'clock position shows sonographic findings compatible with benign fat necrosis, as visualized in this region on prior mammograms. No fluid collections are seen in the region of prior lumpectomy. IMPRESSION: No focal fluid collection, abscess, or breast edema is identified in the periareolar/retroareolar  left breast in the region of prior excisional biopsy in 2017. Benign fat necrosis is seen at the site of prior lumpectomy and 9 o'clock left breast. No imaging findings to suggest infection. RECOMMENDATION: Clinical follow-up. Annual mammography is recommended. The patient will be due for her bilateral diagnostic mammogram in April 2019. I have discussed the findings and recommendations with the patient. Results were also provided in writing at the conclusion of the visit. If applicable, a reminder letter will be sent to the patient regarding the next appointment. BI-RADS CATEGORY  2: Benign. Electronically Signed   By: Curlene Dolphin M.D.   On: 10/14/2016 12:07    Anti-infectives: Anti-infectives    Start     Dose/Rate Route Frequency Ordered Stop   10/15/16 1600  Ampicillin-Sulbactam (UNASYN) 3 g in sodium chloride 0.9 % 100 mL IVPB     3 g 200 mL/hr over 30 Minutes Intravenous Every 6 hours 10/15/16 1453     10/15/16 1245  vancomycin (VANCOCIN) IVPB 1000 mg/200 mL premix     1,000 mg 200 mL/hr over 60 Minutes Intravenous  Once 10/15/16 1237 10/15/16 1510   10/15/16 1245  cefTRIAXone (ROCEPHIN) 1 g in dextrose 5 % 50 mL IVPB - Premix     1 g 100 mL/hr over 30 Minutes Intravenous  Once 10/15/16 1237 10/15/16 1345      Assessment/Plan:  Patient is on IV antibiotics for left-sided mastitis. Her exam shows no sign of fluctuance or the need for drainage. She is not having any further fever since last night. We'll continue IV antibiotics for now and order an ultrasound for the morning to ensure that there is no reaccumulation of fluid. This would have accumulated following spontaneous drainage.  Florene Glen, MD, FACS  10/16/2016

## 2016-10-17 ENCOUNTER — Inpatient Hospital Stay: Payer: Medicare HMO | Admitting: Hematology and Oncology

## 2016-10-17 ENCOUNTER — Inpatient Hospital Stay: Payer: Medicare HMO

## 2016-10-17 ENCOUNTER — Ambulatory Visit: Payer: Medicare HMO | Admitting: Radiation Oncology

## 2016-10-17 ENCOUNTER — Other Ambulatory Visit: Payer: Self-pay

## 2016-10-17 DIAGNOSIS — N61 Mastitis without abscess: Principal | ICD-10-CM

## 2016-10-17 LAB — CBC WITH DIFFERENTIAL/PLATELET
Basophils Absolute: 0 10*3/uL (ref 0–0.1)
Basophils Relative: 1 %
Eosinophils Absolute: 0.2 10*3/uL (ref 0–0.7)
Eosinophils Relative: 5 %
HCT: 36.8 % (ref 35.0–47.0)
Hemoglobin: 12.3 g/dL (ref 12.0–16.0)
Lymphocytes Relative: 24 %
Lymphs Abs: 1.2 10*3/uL (ref 1.0–3.6)
MCH: 26.8 pg (ref 26.0–34.0)
MCHC: 33.3 g/dL (ref 32.0–36.0)
MCV: 80.4 fL (ref 80.0–100.0)
Monocytes Absolute: 0.4 10*3/uL (ref 0.2–0.9)
Monocytes Relative: 7 %
Neutro Abs: 3.2 10*3/uL (ref 1.4–6.5)
Neutrophils Relative %: 63 %
Platelets: 172 10*3/uL (ref 150–440)
RBC: 4.58 MIL/uL (ref 3.80–5.20)
RDW: 13.7 % (ref 11.5–14.5)
WBC: 5 10*3/uL (ref 3.6–11.0)

## 2016-10-17 LAB — GLUCOSE, CAPILLARY
Glucose-Capillary: 107 mg/dL — ABNORMAL HIGH (ref 65–99)
Glucose-Capillary: 93 mg/dL (ref 65–99)

## 2016-10-17 MED ORDER — CEPHALEXIN 500 MG PO CAPS: 500.0000 mg | ORAL_CAPSULE | Freq: Four times a day (QID) | ORAL | 0 refills | Status: AC

## 2016-10-17 NOTE — Discharge Summary (Signed)
Patient ID: Donna Riley MRN: [id number] DOB/AGE: 12-04-1960 56 y.o.  Admit date: 10/15/2016 Discharge date: 10/17/2016   Discharge Diagnoses:  Active Problems:   Mastitis   Procedures: None  Hospital Course: Patient was admitted on 5/19 with mild purulent drainage from her left breast lumpectomy site.  She was started on IV antibiotics.  Ultrasound did not reveal any fluid collections.  Her pain and drainage improved and repeat ultrasound did not reveal any collections again.  She was transitioned to oral antibiotics and discharged to home on 5/21  Consults: None  Disposition: 01-Home or Self Care  Discharge Instructions    Activity as tolerated - No restrictions    Complete by:  As directed    Call MD for:  difficulty breathing, headache or visual disturbances    Complete by:  As directed    Call MD for:  persistant nausea and vomiting    Complete by:  As directed    Call MD for:  redness, tenderness, or signs of infection (pain, swelling, redness, odor or green/yellow discharge around incision site)    Complete by:  As directed    Call MD for:  severe uncontrolled pain    Complete by:  As directed    Call MD for:  temperature >100.4    Complete by:  As directed    Diet - low sodium heart healthy    Complete by:  As directed    Discharge instructions    Complete by:  As directed    1.  Continue antibiotic course as indicated until completed     Allergies as of 10/17/2016   No Known Allergies     Medication List    STOP taking these medications   ibuprofen 600 MG tablet Commonly known as:  ADVIL,MOTRIN   sulfamethoxazole-trimethoprim 800-160 MG tablet Commonly known as:  BACTRIM DS,SEPTRA DS     TAKE these medications   amLODipine 5 MG tablet Commonly known as:  NORVASC TAKE 1 TABLET EVERY DAY   anastrozole 1 MG tablet Commonly known as:  ARIMIDEX Take 1 tablet (1 mg total) by mouth daily.   aspirin EC 81 MG tablet Take 81 mg by mouth daily.    cephALEXin 500 MG capsule Commonly known as:  KEFLEX Take 1 capsule (500 mg total) by mouth 4 (four) times daily.   HYDROcodone-acetaminophen 5-325 MG tablet Commonly known as:  NORCO Take 1 tablet by mouth every 6 (six) hours as needed for moderate pain.   lisinopril 40 MG tablet Commonly known as:  PRINIVIL,ZESTRIL Take 1 tablet (40 mg total) by mouth daily.   metFORMIN 500 MG tablet Commonly known as:  GLUCOPHAGE Take 1 tablet (500 mg total) by mouth every morning.      Follow-up Information    Savio Albrecht, Jacqulyn Bath, MD Follow up in 2 week(s).   Specialty:  Surgery Contact information: [address] [place] [address] [phone]

## 2016-10-17 NOTE — Progress Notes (Signed)
Patient discharge teaching given, including activity, diet, follow-up appoints, and medications. Patient verbalized understanding of all discharge instructions. IV access was d/c'd. Vitals are stable. Skin is intact except as charted in most recent assessments. Pt refused to be escorted out by staff, to be driven home by husband.  Donna Riley [employer]

## 2016-10-18 ENCOUNTER — Telehealth: Payer: Self-pay

## 2016-10-18 LAB — HIV ANTIBODY (ROUTINE TESTING W REFLEX): HIV Screen 4th Generation wRfx: NONREACTIVE

## 2016-10-18 NOTE — Telephone Encounter (Signed)
Hospital Follow-up call made to patient at this time. Spoke with Donna Riley. Hospital Follow-up interview questions below.  1. How are you feeling? Patient stated that she is feeling a lot better  2. Is your pain controlled? She stated that she is not having any pain. That the discharge from her breast is much least than it was. And that her breast looked and felt a lot better.  3. What are you doing for the pain? Patient was sent home with Keflex antibiotic   4. Are you having any Nausea or Vomiting? none  5. Are you having any Fever or Chills? Patient stated her last fever was 99.5 and that she feels it is coming down.  6. Are you having any Constipation or Diarrhea? Patient stated that she actually went to the restroom today.  7. Do you have any questions or concerns at this time? She did not have any questions or concerns at this time.   Discussion: I reminder her of her follow-up appointment with Dr. Adonis Huguenin on 10/27/16 at 10:15AM. Patient verbalized understanding.

## 2016-10-19 ENCOUNTER — Ambulatory Visit: Payer: Commercial Managed Care - HMO | Admitting: Radiation Oncology

## 2016-10-20 LAB — CULTURE, BLOOD (ROUTINE X 2)
Culture: NO GROWTH
Culture: NO GROWTH
Special Requests: ADEQUATE
Special Requests: ADEQUATE

## 2016-10-21 ENCOUNTER — Inpatient Hospital Stay: Payer: Medicare HMO | Attending: Hematology and Oncology

## 2016-10-21 ENCOUNTER — Inpatient Hospital Stay (HOSPITAL_BASED_OUTPATIENT_CLINIC_OR_DEPARTMENT_OTHER): Payer: Medicare HMO | Admitting: Hematology and Oncology

## 2016-10-21 VITALS — BP 161/82 | HR 63 | Temp 97.1°F | Resp 18 | Wt 204.0 lb

## 2016-10-21 DIAGNOSIS — M8580 Other specified disorders of bone density and structure, unspecified site: Secondary | ICD-10-CM

## 2016-10-21 DIAGNOSIS — Z79811 Long term (current) use of aromatase inhibitors: Secondary | ICD-10-CM

## 2016-10-21 DIAGNOSIS — K621 Rectal polyp: Secondary | ICD-10-CM | POA: Insufficient documentation

## 2016-10-21 DIAGNOSIS — I10 Essential (primary) hypertension: Secondary | ICD-10-CM | POA: Diagnosis not present

## 2016-10-21 DIAGNOSIS — Z923 Personal history of irradiation: Secondary | ICD-10-CM | POA: Insufficient documentation

## 2016-10-21 DIAGNOSIS — Z9221 Personal history of antineoplastic chemotherapy: Secondary | ICD-10-CM

## 2016-10-21 DIAGNOSIS — C50912 Malignant neoplasm of unspecified site of left female breast: Secondary | ICD-10-CM | POA: Diagnosis not present

## 2016-10-21 DIAGNOSIS — R160 Hepatomegaly, not elsewhere classified: Secondary | ICD-10-CM | POA: Diagnosis not present

## 2016-10-21 DIAGNOSIS — C7951 Secondary malignant neoplasm of bone: Secondary | ICD-10-CM | POA: Diagnosis not present

## 2016-10-21 DIAGNOSIS — Z170 Estrogen receptor positive status [ER+]: Secondary | ICD-10-CM

## 2016-10-21 DIAGNOSIS — M1990 Unspecified osteoarthritis, unspecified site: Secondary | ICD-10-CM | POA: Diagnosis not present

## 2016-10-21 DIAGNOSIS — E119 Type 2 diabetes mellitus without complications: Secondary | ICD-10-CM | POA: Insufficient documentation

## 2016-10-21 LAB — COMPREHENSIVE METABOLIC PANEL
ALT: 44 U/L (ref 14–54)
AST: 30 U/L (ref 15–41)
Albumin: 4 g/dL (ref 3.5–5.0)
Alkaline Phosphatase: 89 U/L (ref 38–126)
Anion gap: 6 (ref 5–15)
BUN: 9 mg/dL (ref 6–20)
CO2: 26 mmol/L (ref 22–32)
Calcium: 9.3 mg/dL (ref 8.9–10.3)
Chloride: 106 mmol/L (ref 101–111)
Creatinine, Ser: 0.73 mg/dL (ref 0.44–1.00)
GFR calc Af Amer: >60 mL/min (ref >60)
GFR calc non Af Amer: >60 mL/min (ref >60)
Glucose, Bld: 95 mg/dL (ref 65–99)
Potassium: 3.7 mmol/L (ref 3.5–5.1)
Sodium: 138 mmol/L (ref 135–145)
Total Bilirubin: 0.5 mg/dL (ref 0.3–1.2)
Total Protein: 7.7 g/dL (ref 6.5–8.1)

## 2016-10-21 LAB — CBC WITH DIFFERENTIAL/PLATELET
Basophils Absolute: 0.1 10*3/uL (ref 0–0.1)
Basophils Relative: 1 %
Eosinophils Absolute: 0.1 10*3/uL (ref 0–0.7)
Eosinophils Relative: 2 %
HCT: 38.7 % (ref 35.0–47.0)
Hemoglobin: 13 g/dL (ref 12.0–16.0)
Lymphocytes Relative: 34 %
Lymphs Abs: 2.3 10*3/uL (ref 1.0–3.6)
MCH: 27 pg (ref 26.0–34.0)
MCHC: 33.6 g/dL (ref 32.0–36.0)
MCV: 80.6 fL (ref 80.0–100.0)
Monocytes Absolute: 0.4 10*3/uL (ref 0.2–0.9)
Monocytes Relative: 6 %
Neutro Abs: 3.8 10*3/uL (ref 1.4–6.5)
Neutrophils Relative %: 57 %
Platelets: 248 10*3/uL (ref 150–440)
RBC: 4.8 MIL/uL (ref 3.80–5.20)
RDW: 14 % (ref 11.5–14.5)
WBC: 6.7 10*3/uL (ref 3.6–11.0)

## 2016-10-21 NOTE — Progress Notes (Signed)
Patient just got out of the hospital Monday regarding breast infection.  Currently still on Keflex.  Patient's BP elevated 161/82 HR 63.  Patient states she took her BP meds today.  She has noticed it has been up for awhile.  She has appointment with PCP Monday.

## 2016-10-21 NOTE — Progress Notes (Signed)
[facility] day:  10/21/16   Chief Complaint: Donna Riley is a 56 y.o. female with stage IIA left breast cancer who is seen for 4 month assessment.  HPI: The patient was last seen in the medical oncology clinic on 06/20/2016.  At that time, he breast was healing by secondary intention.  She was seeing Dr. Azalee Course every 3 weeks.  She states that she just got out of the hospital with another breast infection.  She was placed on Septra for 10 days.  She has not seen Dr. Azalee Course.   Past Medical History:  Diagnosis Date  . Arthritis    shoulders and neck  . Breast cancer ([place]) 2016    Left breast with chemo + rad tx's with lumpectomy.  . Diabetes mellitus without complication ([place]) 07/5571  . Elevated LFTs   . Hypertension   . Personal history of chemotherapy   . Personal history of radiation therapy   . Positive PPD, treated   . Shortness of breath dyspnea    with exertion    Past Surgical History:  Procedure Laterality Date  . BREAST BIOPSY Left 07/31/2014   positive. Lumpectomy 08/27/2014 with chemo and rad  . BREAST BIOPSY Left 03/08/2016   path pending  . BREAST LUMPECTOMY WITH NEEDLE LOCALIZATION Left 04/05/2016   Procedure: BREAST LUMPECTOMY WITH NEEDLE LOCALIZATION;  Surgeon: Hubbard Robinson, MD;  Location: [facility];  Service: General;  Laterality: Left;  . BREAST LUMPECTOMY WITH NEEDLE LOCALIZATION AND AXILLARY SENTINEL LYMPH NODE BX Left 08/27/14  . CHOLECYSTECTOMY    . COLONOSCOPY WITH PROPOFOL N/A 08/28/2015   Procedure: COLONOSCOPY WITH PROPOFOL;  Surgeon: Lucilla Lame, MD;  Location: [facility];  Service: Endoscopy;  Laterality: N/A;  Diabetic oral  . ESOPHAGOGASTRODUODENOSCOPY  2014   gastritis  . HERNIA REPAIR    . POLYPECTOMY  08/28/2015   Procedure: POLYPECTOMY INTESTINAL;  Surgeon: Lucilla Lame, MD;  Location: [facility];  Service: Endoscopy;;  Rectal polyp  . TUBAL LIGATION Bilateral   . UMBILICAL  HERNIA REPAIR N/A 08/09/2016   Procedure: HERNIA REPAIR UMBILICAL ADULT;  Surgeon: Olean Ree, MD;  Location: [facility];  Service: General;  Laterality: N/A;  . VENTRAL HERNIA REPAIR N/A 04/10/2015   Procedure: HERNIA REPAIR VENTRAL ADULT;  Surgeon: Leonie Green, MD;  Location: [facility];  Service: General;  Laterality: N/A;    Family History  Problem Relation Age of Onset  . Cancer Mother        Pancreatic  . Cancer Father        Throat  . Cancer Maternal Grandmother        Breast  . Breast cancer Maternal Grandmother 75    Social History:  reports that she has been smoking Cigarettes.  She has a 9.50 pack-year smoking history. She has never used smokeless tobacco. She reports that she drinks alcohol. She reports that she does not use drugs.  She lives in [place].  The patient is alone today.  Allergies: No Known Allergies  Current Medications: Current Outpatient Prescriptions  Medication Sig Dispense Refill  . amLODipine (NORVASC) 5 MG tablet TAKE 1 TABLET EVERY DAY 90 tablet 0  . anastrozole (ARIMIDEX) 1 MG tablet Take 1 tablet (1 mg total) by mouth daily. 30 tablet 0  . aspirin EC 81 MG tablet Take 81 mg by mouth daily.    . cephALEXin (KEFLEX) 500 MG capsule Take 1 capsule (500 mg total) by mouth 4 (  four) times daily. 40 capsule 0  . HYDROcodone-acetaminophen (NORCO) 5-325 MG tablet Take 1 tablet by mouth every 6 (six) hours as needed for moderate pain. 20 tablet 0  . ibuprofen (ADVIL,MOTRIN) 600 MG tablet     . lisinopril (PRINIVIL,ZESTRIL) 40 MG tablet Take 1 tablet (40 mg total) by mouth daily. 90 tablet 3  . metFORMIN (GLUCOPHAGE) 500 MG tablet Take 1 tablet (500 mg total) by mouth every morning. 90 tablet 1  . oxyCODONE (OXY IR/ROXICODONE) 5 MG immediate release tablet     . sulfamethoxazole-trimethoprim (BACTRIM DS,SEPTRA DS) 800-160 MG tablet      No current facility-administered medications for this visit.     Review of Systems:  GENERAL: Feels "ok".  No  fevers or sweats.  Weight down 1 pound. PERFORMANCE STATUS (ECOG): 1 HEENT: No visual changes, runny nose, sore throat, mouth sores or tenderness. Lungs: No shortness of breath or cough. No hemoptysis. Cardiac: No chest pain, palpitations, orthopnea, or PND. Breast: Wound healing by secondary intention.  Recent infection and hospitalization. GI: No nausea, vomiting, constipation, melena or hematochezia.  s/p colonoscsopy 08/28/2015. GU: No urgency, frequency, dysuria, or hematuria. Musculoskeletal: No back pain. No joint pain. No muscle tenderness. Extremities: No pain or swelling. Skin: No skin changes, rashes or ulcers. Neuro:No headache, numbness or weakness, balance or coordination issues. Endocrine: Hot flashes.  No diabetes, thyroid issues or night sweats. Psych: Trouble sleeping at night.  No mood changes, depression or anxiety. Pain: No focal pain. Review of systems: All other systems reviewed and found to be negative.  Physical Exam: Blood pressure (!) 161/82, pulse 63, temperature 97.1 F (36.2 C), resp. rate 18, weight 204 lb (92.5 kg). GENERAL: Well developed, well nourished, woman sitting comfortably in the exam room in no acute distress. MENTAL STATUS: Alert and oriented to person, place and time. HEAD: Short brown hair. Normocephalic, atraumatic, face symmetric, no Cushingoid features. EYES: Brown eyes. Pupils equal round and reactive to light and accomodation. No conjunctivitis or scleral icterus. ENT: Oropharynx clear without lesion. Tongue normal. Mucous membranes moist.  RESPIRATORY: Clear to auscultation without rales, wheezes or rhonchi. CARDIOVASCULAR: Regular rate and rhythm without murmur, rub or gallop. BREASTS:  Right breast without masses, skin changes or nipple discharge.  Left breast with moderate radiation induced hyperpigmentation.  Post surgical and post radiation changes with edema.  Arched incision.  Wound smaller.  No  purulence. ABDOMEN: Ventral hernia.  Soft, non-tender, with active bowel sounds, and no hepatosplenomegaly. No masses. SKIN:  No rashes, ulcers or lesions. EXTREMITIES: No edema, no skin discoloration or tenderness. No palpable cords. LYMPH NODES: No palpable cervical, supraclavicular, axillary or inguinal adenopathy  NEUROLOGICAL: Unremarkable.  PSYCH: Appropriate.   Appointment on 10/21/2016  Component Date Value Ref Range Status  . Sodium 10/21/2016 138  135 - 145 mmol/L Final  . Potassium 10/21/2016 3.7  3.5 - 5.1 mmol/L Final  . Chloride 10/21/2016 106  101 - 111 mmol/L Final  . CO2 10/21/2016 26  22 - 32 mmol/L Final  . Glucose, Bld 10/21/2016 95  65 - 99 mg/dL Final  . BUN 10/21/2016 9  6 - 20 mg/dL Final  . Creatinine, Ser 10/21/2016 0.73  0.44 - 1.00 mg/dL Final  . Calcium 10/21/2016 9.3  8.9 - 10.3 mg/dL Final  . Total Protein 10/21/2016 7.7  6.5 - 8.1 g/dL Final  . Albumin 10/21/2016 4.0  3.5 - 5.0 g/dL Final  . AST 10/21/2016 30  15 - 41 U/L Final  . ALT  10/21/2016 44  14 - 54 U/L Final  . Alkaline Phosphatase 10/21/2016 89  38 - 126 U/L Final  . Total Bilirubin 10/21/2016 0.5  0.3 - 1.2 mg/dL Final  . GFR calc non Af Amer 10/21/2016 >60  >60 mL/min Final  . GFR calc Af Amer 10/21/2016 >60  >60 mL/min Final   Comment: (NOTE) The eGFR has been calculated using the CKD EPI equation. This calculation has not been validated in all clinical situations. eGFR's persistently <60 mL/min signify possible Chronic Kidney Disease.   . Anion gap 10/21/2016 6  5 - 15 Final  . WBC 10/21/2016 6.7  3.6 - 11.0 K/uL Final  . RBC 10/21/2016 4.80  3.80 - 5.20 MIL/uL Final  . Hemoglobin 10/21/2016 13.0  12.0 - 16.0 g/dL Final  . HCT 10/21/2016 38.7  35.0 - 47.0 % Final  . MCV 10/21/2016 80.6  80.0 - 100.0 fL Final  . MCH 10/21/2016 27.0  26.0 - 34.0 pg Final  . MCHC 10/21/2016 33.6  32.0 - 36.0 g/dL Final  . RDW 10/21/2016 14.0  11.5 - 14.5 % Final  . Platelets 10/21/2016 248  150  - 440 K/uL Final  . Neutrophils Relative % 10/21/2016 57  % Final  . Neutro Abs 10/21/2016 3.8  1.4 - 6.5 K/uL Final  . Lymphocytes Relative 10/21/2016 34  % Final  . Lymphs Abs 10/21/2016 2.3  1.0 - 3.6 K/uL Final  . Monocytes Relative 10/21/2016 6  % Final  . Monocytes Absolute 10/21/2016 0.4  0.2 - 0.9 K/uL Final  . Eosinophils Relative 10/21/2016 2  % Final  . Eosinophils Absolute 10/21/2016 0.1  0 - 0.7 K/uL Final  . Basophils Relative 10/21/2016 1  % Final  . Basophils Absolute 10/21/2016 0.1  0 - 0.1 K/uL Final    Assessment:  Donna Riley is a 56 y.o. African American female with stage IIA left breast cancer status post partial mastectomy and sentinel lymph node biopsy on 08/27/2014. 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 There was lymphovascular invasion. One of 2 sentinel lymph nodes were positive (focus of 2.8 mm). Tumor was > 90% ER positive, > 90% PR positive, and Her2/neu negative. Pathologic stage was [id number].  Bone scan on 09/16/2014 revealed abnormal focal uptake at the level of right L3 pedicle and L5 vertebral body. Lumbar spine MRI on 09/27/2014 revealed no evidence of metastatic disease with lower lumbar facet arthritis. Abdominal and pelvic CT scan on 09/24/2014 revealed hepatomegaly and no evidence of metastatic disease.   Bone density study on 09/15/2014 revealed osteopenia with a T-score of -2.1 at L1-L4.  She is on calcium and vitamin D.  She received 4 cycles of Taxotere and Cytoxan (09/29/2014 - 12/09/2014) with Neulasta support. She received 50.4 Gy to the left breast from 01/12/2015 until 02/23/2015.  She was started on Femara on 03/12/2015, but switched to Arimidex on 03/30/2015 secondary to diffuse joint aches.    CA27.29 has been followed: 16.9 on 09/09/2014, 12.1 on 06/02/2015, 15.6 on 08/07/2015, 17.4 on 11/23/2015, 13.6 on 02/22/2016, 18.9 on 06/20/2016, and 18.4 on  10/21/2016.  Bilateral diagnostic mammogram on 07/30/2015 revealed no evidence of malignancy.  Left sided mammogram and ultrasound on 02/29/2016 revealed a 1.5 x 0.6 x 0.7 cm intraductal mass.  Bilateral mammogram on 09/12/2016 revealed no focal fluid collection at the surgical site to account for the persistent drainage. There were no suspicious mammographic or targeted sonographic  abnormalities at the lumpectomy site. There was no evidence of malignancy in the bilateral breasts.  She had chronic left nipple discharge.  Left breast lumpectomy at the 11 o'clock position on 04/05/2016 revealed an intraductal papilloma with sclerosis and calcifications. There was fat necrosis with fibrosis and calcifications. Pathology was negative for atypia and malignancy  Colonoscopy on 08/28/2015 revealed one 4 mm polyp in the rectum (tubular adenoma).  Symptomatically, she is doing well on Arimidex.  She has chronic fatigue.  TSH was normal on 06/20/2016.  She has recently been on antibiotics.  Exam reveals left breast wound healing slowly by secondary intention.  Plan: 1.  Labs today:  CBC with diff, CMP, CA27.29. 2.  Follow-up as scheduled with surgery. 3.  Continue Arimidex.  4.  RTC in 4 months for MD assessment and labs (CBC with diff, CMP, CA27.29).   Donna Asal, MD  10/21/2016, 12:42 PM

## 2016-10-22 LAB — CANCER ANTIGEN 27.29: CA 27.29: 18.4 U/mL (ref 0.0–38.6)

## 2016-10-25 ENCOUNTER — Ambulatory Visit: Payer: Self-pay | Admitting: Physician Assistant

## 2016-10-25 ENCOUNTER — Ambulatory Visit (INDEPENDENT_AMBULATORY_CARE_PROVIDER_SITE_OTHER): Payer: Medicare HMO | Admitting: Physician Assistant

## 2016-10-25 ENCOUNTER — Encounter: Payer: Self-pay | Admitting: Physician Assistant

## 2016-10-25 VITALS — BP 140/85 | HR 64 | Ht 68.0 in | Wt 204.0 lb

## 2016-10-25 DIAGNOSIS — E119 Type 2 diabetes mellitus without complications: Secondary | ICD-10-CM

## 2016-10-25 DIAGNOSIS — I10 Essential (primary) hypertension: Secondary | ICD-10-CM | POA: Diagnosis not present

## 2016-10-25 MED ORDER — AMLODIPINE BESYLATE 5 MG PO TABS: 5.0000 mg | ORAL_TABLET | Freq: Every day | ORAL | 0 refills | Status: DC

## 2016-10-25 MED ORDER — LISINOPRIL 40 MG PO TABS: 40.0000 mg | ORAL_TABLET | Freq: Every day | ORAL | 3 refills | Status: DC

## 2016-10-25 MED ORDER — METFORMIN HCL 500 MG PO TABS: 500.0000 mg | ORAL_TABLET | Freq: Every morning | ORAL | 1 refills | Status: DC

## 2016-10-25 NOTE — Progress Notes (Signed)
Subjective:    Patient ID: Donna Riley, female    DOB: 1961/02/09, 56 y.o.   MRN: [id number]  Donna Riley is a 56 y.o. female presenting on 10/25/2016 for Medication Refill (Pt needs medication refilled- wants 90 days sent to [facility] mail delivery pharm. )   HPI   Patient is 56 y/o with history of breast cancer, HTN on amlodipine and Lisinopril, and diabetes mellitus who is here for medication refill. She is doing well with BP medications, no dizziness, chest pain, bothersome ankle swelling or cough. Takes medications daily. She checks her sugars once weekly, her fastings have been around 120. She wears house slippers around the house. No episodes of hypoglycemia.  Social History  Substance Use Topics  . Smoking status: Current Every Day Smoker    Packs/day: 0.25    Years: 38.00    Types: Cigarettes  . Smokeless tobacco: Never Used  . Alcohol use Yes     Comment: Occasionally     Review of Systems Per HPI unless specifically indicated above     Objective:    BP 140/85   Pulse 64   Ht 5\' 8"  (1.727 m)   Wt 204 lb (92.5 kg)   SpO2 100%   BMI 31.02 kg/m   Wt Readings from Last 3 Encounters:  10/25/16 204 lb (92.5 kg)  10/21/16 204 lb (92.5 kg)  10/15/16 206 lb (93.4 kg)    Physical Exam  Constitutional: She is oriented to person, place, and time. She appears well-developed and well-nourished.  Cardiovascular: Normal rate and regular rhythm.   Pulmonary/Chest: Effort normal and breath sounds normal.  Musculoskeletal: She exhibits no edema, tenderness or deformity.  Neurological: She is alert and oriented to person, place, and time.  Skin: Skin is warm and dry.  Psychiatric: She has a normal mood and affect. Her behavior is normal.   Diabetic Foot Exam - Simple   Simple Foot Form Diabetic Foot exam was performed with the following findings:  Yes 10/25/2016  4:20 PM  Visual Inspection No deformities, no ulcerations, no other skin breakdown bilaterally:   Yes Sensation Testing Intact to touch and monofilament testing bilaterally:  Yes Pulse Check Posterior Tibialis and Dorsalis pulse intact bilaterally:  Yes Comments     Results for orders placed or performed in visit on 10/21/16  Comprehensive metabolic panel  Result Value Ref Range   Sodium 138 135 - 145 mmol/L   Potassium 3.7 3.5 - 5.1 mmol/L   Chloride 106 101 - 111 mmol/L   CO2 26 22 - 32 mmol/L   Glucose, Bld 95 65 - 99 mg/dL   BUN 9 6 - 20 mg/dL   Creatinine, Ser 0.73 0.44 - 1.00 mg/dL   Calcium 9.3 8.9 - 10.3 mg/dL   Total Protein 7.7 6.5 - 8.1 g/dL   Albumin 4.0 3.5 - 5.0 g/dL   AST 30 15 - 41 U/L   ALT 44 14 - 54 U/L   Alkaline Phosphatase 89 38 - 126 U/L   Total Bilirubin 0.5 0.3 - 1.2 mg/dL   GFR calc non Af Amer >60 >60 mL/min   GFR calc Af Amer >60 >60 mL/min   Anion gap 6 5 - 15  CBC with Differential/Platelet  Result Value Ref Range   WBC 6.7 3.6 - 11.0 K/uL   RBC 4.80 3.80 - 5.20 MIL/uL   Hemoglobin 13.0 12.0 - 16.0 g/dL   HCT 38.7 35.0 - 47.0 %   MCV 80.6 80.0 -  100.0 fL   MCH 27.0 26.0 - 34.0 pg   MCHC 33.6 32.0 - 36.0 g/dL   RDW 14.0 11.5 - 14.5 %   Platelets 248 150 - 440 K/uL   Neutrophils Relative % 57 %   Neutro Abs 3.8 1.4 - 6.5 K/uL   Lymphocytes Relative 34 %   Lymphs Abs 2.3 1.0 - 3.6 K/uL   Monocytes Relative 6 %   Monocytes Absolute 0.4 0.2 - 0.9 K/uL   Eosinophils Relative 2 %   Eosinophils Absolute 0.1 0 - 0.7 K/uL   Basophils Relative 1 %   Basophils Absolute 0.1 0 - 0.1 K/uL  Cancer antigen 27.29  Result Value Ref Range   CA 27.29 18.4 0.0 - 38.6 U/mL      Assessment & Plan:  1. Essential (primary) hypertension  Improved, though borderline high.   - lisinopril (PRINIVIL,ZESTRIL) 40 MG tablet; Take 1 tablet (40 mg total) by mouth daily.  Dispense: 90 tablet; Refill: 3 - amLODipine (NORVASC) 5 MG tablet; Take 1 tablet (5 mg total) by mouth daily.  Dispense: 90 tablet; Refill: 0  2. Controlled type 2 diabetes mellitus  without complication, without long-term current use of insulin ([place])  Labs as below.  - metFORMIN (GLUCOPHAGE) 500 MG tablet; Take 1 tablet (500 mg total) by mouth every morning.  Dispense: 90 tablet; Refill: 1 - Hemoglobin A1c - Microalbumin, urine  Return in about 3 months (around 01/25/2017) for DM, HTN.   Carles Collet, PA-C [facility] 10/25/2016, 4:20 PM

## 2016-10-25 NOTE — Patient Instructions (Signed)
Diabetes Mellitus and Exercise Exercising regularly is important for your overall health, especially when you have diabetes (diabetes mellitus). Exercising is not only about losing weight. It has many health benefits, such as increasing muscle strength and bone density and reducing body fat and stress. This leads to improved fitness, flexibility, and endurance, all of which result in better overall health. Exercise has additional benefits for people with diabetes, including:  Reducing appetite.  Helping to lower and control blood glucose.  Lowering blood pressure.  Helping to control amounts of fatty substances (lipids) in the blood, such as cholesterol and triglycerides.  Helping the body to respond better to insulin (improving insulin sensitivity).  Reducing how much insulin the body needs.  Decreasing the risk for heart disease by:  Lowering cholesterol and triglyceride levels.  Increasing the levels of good cholesterol.  Lowering blood glucose levels. What is my activity plan? Your health care provider or certified diabetes educator can help you make a plan for the type and frequency of exercise (activity plan) that works for you. Make sure that you:  Do at least 150 minutes of moderate-intensity or vigorous-intensity exercise each week. This could be brisk walking, biking, or water aerobics.  Do stretching and strength exercises, such as yoga or weightlifting, at least 2 times a week.  Spread out your activity over at least 3 days of the week.  Get some form of physical activity every day.  Do not go more than 2 days in a row without some kind of physical activity.  Avoid being inactive for more than 90 minutes at a time. Take frequent breaks to walk or stretch.  Choose a type of exercise or activity that you enjoy, and set realistic goals.  Start slowly, and gradually increase the intensity of your exercise over time. What do I need to know about managing my  diabetes?  Check your blood glucose before and after exercising.  If your blood glucose is higher than 240 mg/dL (13.3 mmol/L) before you exercise, check your urine for ketones. If you have ketones in your urine, do not exercise until your blood glucose returns to normal.  Know the symptoms of low blood glucose (hypoglycemia) and how to treat it. Your risk for hypoglycemia increases during and after exercise. Common symptoms of hypoglycemia can include:  Hunger.  Anxiety.  Sweating and feeling clammy.  Confusion.  Dizziness or feeling light-headed.  Increased heart rate or palpitations.  Blurry vision.  Tingling or numbness around the mouth, lips, or tongue.  Tremors or shakes.  Irritability.  Keep a rapid-acting carbohydrate snack available before, during, and after exercise to help prevent or treat hypoglycemia.  Avoid injecting insulin into areas of the body that are going to be exercised. For example, avoid injecting insulin into:  The arms, when playing tennis.  The legs, when jogging.  Keep records of your exercise habits. Doing this can help you and your health care provider adjust your diabetes management plan as needed. Write down:  Food that you eat before and after you exercise.  Blood glucose levels before and after you exercise.  The type and amount of exercise you have done.  When your insulin is expected to peak, if you use insulin. Avoid exercising at times when your insulin is peaking.  When you start a new exercise or activity, work with your health care provider to make sure the activity is safe for you, and to adjust your insulin, medicines, or food intake as needed.  Drink plenty   of water while you exercise to prevent dehydration or heat stroke. Drink enough fluid to keep your urine clear or pale yellow. This information is not intended to replace advice given to you by your health care provider. Make sure you discuss any questions you have with  your health care provider. Document Released: 08/06/2003 Document Revised: 12/04/2015 Document Reviewed: 10/26/2015 [employer]vier Interactive Patient Education  2017 [employer]  

## 2016-10-26 ENCOUNTER — Inpatient Hospital Stay: Payer: Medicare HMO | Admitting: Surgery

## 2016-10-26 LAB — MICROALBUMIN, URINE: Microalbumin, Urine: 14.9 ug/mL

## 2016-10-27 ENCOUNTER — Ambulatory Visit (INDEPENDENT_AMBULATORY_CARE_PROVIDER_SITE_OTHER): Payer: Medicare HMO | Admitting: General Surgery

## 2016-10-27 ENCOUNTER — Ambulatory Visit
Admission: RE | Admit: 2016-10-27 | Discharge: 2016-10-27 | Disposition: A | Discharge status: Home / Self Care | Payer: Medicare HMO | Source: Ambulatory Visit | Attending: Radiation Oncology | Admitting: Radiation Oncology

## 2016-10-27 ENCOUNTER — Encounter: Payer: Self-pay | Admitting: General Surgery

## 2016-10-27 ENCOUNTER — Encounter: Payer: Self-pay | Admitting: Radiation Oncology

## 2016-10-27 VITALS — BP 177/94 | HR 69 | Temp 97.1°F | Wt 205.7 lb

## 2016-10-27 VITALS — BP 166/73 | HR 72 | Temp 98.1°F | Wt 205.0 lb

## 2016-10-27 DIAGNOSIS — Z79811 Long term (current) use of aromatase inhibitors: Secondary | ICD-10-CM | POA: Diagnosis not present

## 2016-10-27 DIAGNOSIS — Z170 Estrogen receptor positive status [ER+]: Secondary | ICD-10-CM | POA: Insufficient documentation

## 2016-10-27 DIAGNOSIS — C50212 Malignant neoplasm of upper-inner quadrant of left female breast: Secondary | ICD-10-CM | POA: Insufficient documentation

## 2016-10-27 DIAGNOSIS — N644 Mastodynia: Secondary | ICD-10-CM | POA: Diagnosis not present

## 2016-10-27 DIAGNOSIS — Z853 Personal history of malignant neoplasm of breast: Secondary | ICD-10-CM | POA: Diagnosis not present

## 2016-10-27 DIAGNOSIS — C50912 Malignant neoplasm of unspecified site of left female breast: Secondary | ICD-10-CM

## 2016-10-27 DIAGNOSIS — Z923 Personal history of irradiation: Secondary | ICD-10-CM | POA: Insufficient documentation

## 2016-10-27 MED ORDER — OXYCODONE HCL 5 MG PO TABS: 5.0000 mg | ORAL_TABLET | Freq: Four times a day (QID) | ORAL | 0 refills | Status: DC | PRN

## 2016-10-27 NOTE — Progress Notes (Signed)
Outpatient Surgical Follow Up  10/27/2016  Donna Riley is an 56 y.o. female.   Chief Complaint  Patient presents with  . Follow-up    Left Breast Drainage and Pain- History of Left Lumpectomy (04/05/16)- Dr. Azalee Course    HPI: 56 year old female who is well known to the surgery service returns to clinic today for hospital follow-up. She was recently admitted for mastitis of the left breast requiring IV antibiotics. She states that she is still on antibiotics and that the area has continued to improve. He continues to have some drainage. Especially overnight. She is continuing to have pain to the left breast, especially at night. She is taking pain medications for it. She currently denies any fevers, chills, nausea, vomiting, shortness of breath, diarrhea, constipation. Upon questioning she is continuing to smoke but states she is down to a quarter pack a day.  Past Medical History:  Diagnosis Date  . Arthritis    shoulders and neck  . Breast cancer ([place]) 2016    Left breast with chemo + rad tx's with lumpectomy.  . Diabetes mellitus without complication ([place]) 06/8313  . Elevated LFTs   . Hypertension   . Personal history of chemotherapy   . Personal history of radiation therapy   . Positive PPD, treated   . Shortness of breath dyspnea    with exertion    Past Surgical History:  Procedure Laterality Date  . BREAST BIOPSY Left 07/31/2014   positive. Lumpectomy 08/27/2014 with chemo and rad  . BREAST BIOPSY Left 03/08/2016   path pending  . BREAST LUMPECTOMY WITH NEEDLE LOCALIZATION Left 04/05/2016   Procedure: BREAST LUMPECTOMY WITH NEEDLE LOCALIZATION;  Surgeon: Hubbard Robinson, MD;  Location: [facility];  Service: General;  Laterality: Left;  . BREAST LUMPECTOMY WITH NEEDLE LOCALIZATION AND AXILLARY SENTINEL LYMPH NODE BX Left 08/27/14  . CHOLECYSTECTOMY    . COLONOSCOPY WITH PROPOFOL N/A 08/28/2015   Procedure: COLONOSCOPY WITH PROPOFOL;  Surgeon: Lucilla Lame, MD;  Location:  [place];  Service: Endoscopy;  Laterality: N/A;  Diabetic oral  . ESOPHAGOGASTRODUODENOSCOPY  2014   gastritis  . HERNIA REPAIR    . POLYPECTOMY  08/28/2015   Procedure: POLYPECTOMY INTESTINAL;  Surgeon: Lucilla Lame, MD;  Location: [place];  Service: Endoscopy;;  Rectal polyp  . TUBAL LIGATION Bilateral   . UMBILICAL HERNIA REPAIR N/A 08/09/2016   Procedure: HERNIA REPAIR UMBILICAL ADULT;  Surgeon: Olean Ree, MD;  Location: [facility];  Service: General;  Laterality: N/A;  . VENTRAL HERNIA REPAIR N/A 04/10/2015   Procedure: HERNIA REPAIR VENTRAL ADULT;  Surgeon: Leonie Green, MD;  Location: [facility];  Service: General;  Laterality: N/A;    Family History  Problem Relation Age of Onset  . Cancer Mother        Pancreatic  . Cancer Father        Throat  . Cancer Maternal Grandmother        Breast  . Breast cancer Maternal Grandmother 75    Social History:  reports that she has been smoking Cigarettes.  She has a 9.50 pack-year smoking history. She has never used smokeless tobacco. She reports that she drinks alcohol. She reports that she does not use drugs.  Allergies: No Known Allergies  Medications reviewed.    ROS A multipoint review of systems was completed, all pertinent positives and negatives are documented within the history of present illness the remainder are negative   BP (!) 166/73   Pulse 72  Temp 98.1 F (36.7 C) (Oral)   Wt 93 kg (205 lb)   BMI 31.17 kg/m   Physical Exam Gen.: No acute distress Neck: Supple and nontender Lymph nodes: No evidence of palpable cervical, clavicular, axillary lymphadenopathy Breast: Left breast exam. Continues to have pinpoint area of clear drainage from the midpoint of her medial incision. The nipple is retracted medially towards the eschar. The entire breast shows evidence of radiation changes and is tender to palpation. No obvious erythema or purulence. Chest: Clear to auscultation Heart:  Regular rate and rhythm Abdomen: Soft and nontender    Results for orders placed or performed in visit on 10/25/16 (from the past 48 hour(s))  Microalbumin, urine     Status: None   Collection Time: 10/25/16 12:10 PM  Result Value Ref Range   Albumin, Urine 14.9 Not Estab. ug/mL   No results found.  Assessment/Plan:  1. Breast pain, left 56 year old female returns to clinic for follow-up of left breast infection. Appears to be improved on antibiotic. Upon questioning the patient and reviewing her chart she's been dealing with left breast pain and intermittent drainage now for nearly 2 years. Despite our numerous interventions, images, antibiotic therapies she continues to have drainage the area. Discussed that she should continue her antibiotics until they're completed. We will provide her with a small prescription of additional pain medications at this time.  Counseled her about the importance of smoking cessation, however given the length of time that she is having this problem offered her referral to a breast specialist today. Patient voiced understanding and states she is ready to try something different. We will refer her to the [facility] breast center for further workup. She can follow-up in clinic on an as-needed basis.   A total of 25 minutes was used on this encounter with greater than 50% of it used for counseling and coordination of care.    Clayburn Pert, MD FACS General Surgeon  10/27/2016,10:48 AM

## 2016-10-27 NOTE — Progress Notes (Signed)
Radiation Oncology Follow up Note  Name: Donna Riley   Date:   10/27/2016 MRN:  [id number] DOB: July 03, 1960    This 56 y.o. 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 She does have problems with drainage from her left breast also based on her surgical incision retraction in her scar producing a only fair cosmetic result. She's had reexcision showing only intraductal papilloma with sclerosis and calcifications with no evidence of atypia or malignancy. She was recently hospitalized with IV antibiotics has been referred to a breast specialist at [facility].Marland Kitchener follow-up mammograms have been BI-RADS 2 benign last one in April 2018. Recent ultrasound no showed no fluid collections abscess or breast edema in the left breast.she is currently on arimadex tolerating that well without side effect  COMPLICATIONS OF TREATMENT: none  FOLLOW UP COMPLIANCE: keeps appointments   PHYSICAL EXAM:  BP (!) 177/94   Pulse 69   Temp 97.1 F (36.2 C)   Wt 205 lb 11 oz (93.3 kg)   BMI 31.27 kg/m  Left breast shows retraction towards the scar site. There is firm hard material present around the nipple areolar complex. No other dominant mass or nodularity is noted in either breast in 2 positions examined. No axillary or supraclavicular adenopathy is identified. Well-developed well-nourished patient in NAD. HEENT reveals PERLA, EOMI, discs not visualized.  Oral cavity is clear. No oral mucosal lesions are identified. Neck is clear without evidence of cervical or supraclavicular adenopathy. Lungs are clear to A&P. Cardiac examination is essentially unremarkable with  regular rate and rhythm without murmur rub or thrill. Abdomen is benign with no organomegaly or masses noted. Motor sensory and DTR levels are equal and symmetric in the upper and lower extremities. Cranial nerves II through XII are grossly intact. Proprioception is intact. No peripheral adenopathy or edema is identified. No motor or sensory levels are noted. Crude visual fields are within normal range.  RADIOLOGY RESULTS: mammogram ultrasound reviewed  PLAN: present time I agree with breast specialist at [facility]. She's had continuous trouble with her surgical excision site with poor cosmetic result. I have asked her personally see her back in 1 year for follow-up. She continues on arimadex without side effect.patient is to call with any concerns.  I would like to take this opportunity to thank you for allowing me to participate in the care of your patient.Armstead Peaks., MD

## 2016-10-27 NOTE — Patient Instructions (Signed)
Please give [place] a call in case your wound gets worse.  Please finish your antibiotic.  We will send a referral to [facility]. Please allow 1-2 weeks for them to call you back.

## 2016-11-03 ENCOUNTER — Other Ambulatory Visit: Payer: Self-pay | Admitting: Hematology and Oncology

## 2016-11-10 ENCOUNTER — Telehealth: Payer: Self-pay | Admitting: General Surgery

## 2016-11-10 NOTE — Telephone Encounter (Signed)
Patient was referred to [facility] per Dr Reginal Lutes request.   Patient was given appointments.   11/18/16 at [facility] for Imaging--9:15am (mammogram and ultrasound)  11/18/16 at [facility] for consult with Lilyan Punt, PA.

## 2016-11-16 DIAGNOSIS — Z853 Personal history of malignant neoplasm of breast: Secondary | ICD-10-CM | POA: Diagnosis not present

## 2016-11-18 DIAGNOSIS — I10 Essential (primary) hypertension: Secondary | ICD-10-CM | POA: Diagnosis not present

## 2016-11-18 DIAGNOSIS — Z79811 Long term (current) use of aromatase inhibitors: Secondary | ICD-10-CM | POA: Diagnosis not present

## 2016-11-18 DIAGNOSIS — R69 Illness, unspecified: Secondary | ICD-10-CM | POA: Diagnosis not present

## 2016-11-18 DIAGNOSIS — N644 Mastodynia: Secondary | ICD-10-CM | POA: Diagnosis not present

## 2016-11-18 DIAGNOSIS — Z853 Personal history of malignant neoplasm of breast: Secondary | ICD-10-CM | POA: Diagnosis not present

## 2016-11-18 DIAGNOSIS — Z803 Family history of malignant neoplasm of breast: Secondary | ICD-10-CM | POA: Diagnosis not present

## 2016-11-18 DIAGNOSIS — Z08 Encounter for follow-up examination after completed treatment for malignant neoplasm: Secondary | ICD-10-CM | POA: Diagnosis not present

## 2016-11-18 DIAGNOSIS — N6452 Nipple discharge: Secondary | ICD-10-CM | POA: Diagnosis not present

## 2016-11-18 DIAGNOSIS — Z9221 Personal history of antineoplastic chemotherapy: Secondary | ICD-10-CM | POA: Diagnosis not present

## 2016-11-18 DIAGNOSIS — E119 Type 2 diabetes mellitus without complications: Secondary | ICD-10-CM | POA: Diagnosis not present

## 2016-12-04 IMAGING — MR MRI LUMBAR SPINE WITHOUT AND WITH CONTRAST
6 of 7 series · 34 of 48 positions shown · IV contrast (multihance)
Comparison: CT 09/24/2014.  Bone scan 09/16/2014.

CLINICAL DATA: Recent diagnosis of breast cancer, 2 months ago.
Abnormal bone scan in the L3 and L5 region.

EXAM:
MRI LUMBAR SPINE WITHOUT AND WITH CONTRAST
TECHNIQUE: Multiplanar and multiecho pulse sequences of the lumbar spine were
obtained without and with intravenous contrast.
CONTRAST:  20 cc MultiHance

[Series 2: T2 · sagittal · 4.0mm · 0.81mm/px · 3 of 15 slices shown (1 of 2)]
[im 1/15]
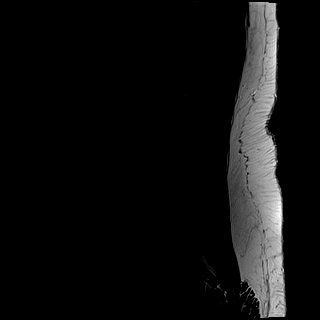
[im 8/15]
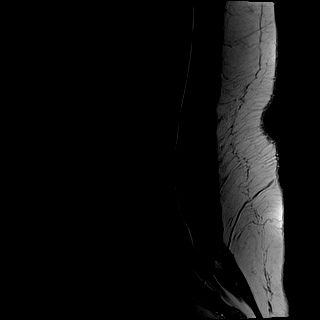
[im 15/15]
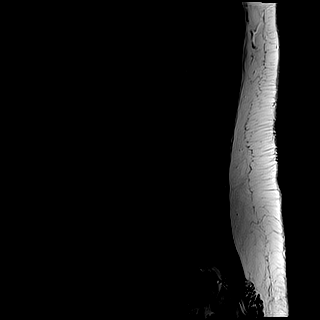

[Series 3: T1 · sagittal · 4.0mm · 0.81mm/px · 4 of 15 slices shown (1 of 2)]
[im 1/15]
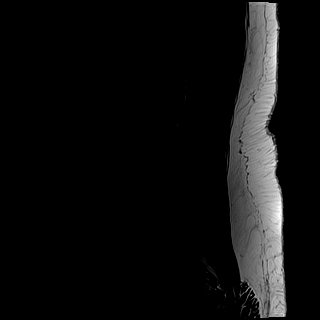
[im 5/15]
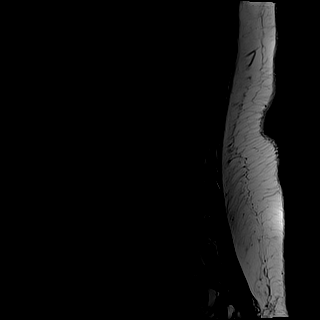
[im 10/15]
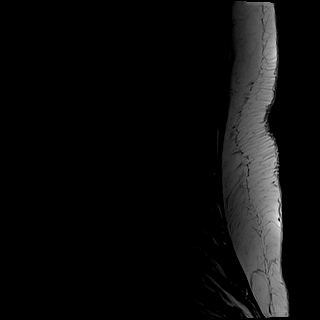
[im 15/15]
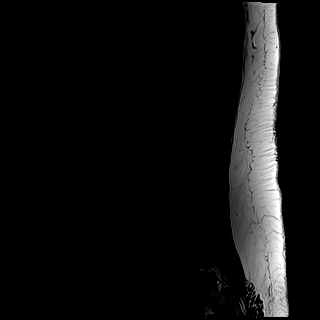

[Series 4: STIR · sagittal · 4.0mm · 1.02mm/px · 1 of 15 slices shown]
[im 1/15]
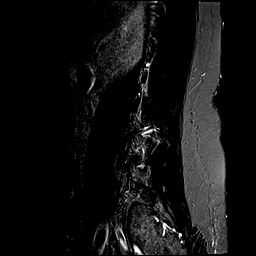

[Series 5: T2 · axial · 4.0mm · 0.78mm/px · z∈[-40,+161]mm · 11 of 40 slices shown (2 of 2)]
[im 1/40]
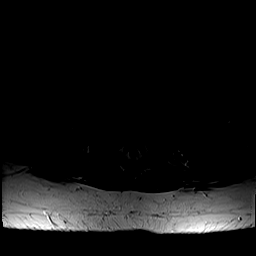
[im 4/40]
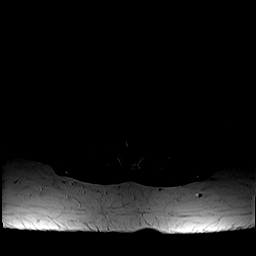
[im 8/40]
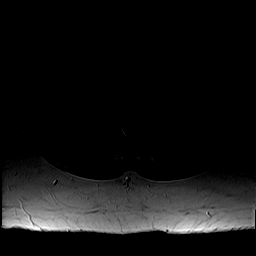
[im 12/40]
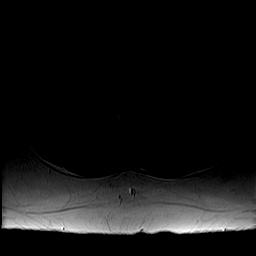
[im 16/40]
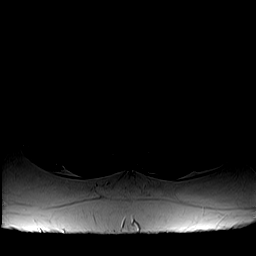
[im 20/40]
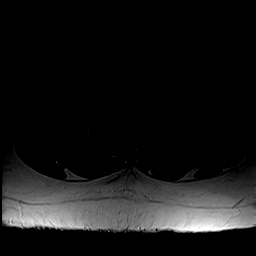
[im 24/40]
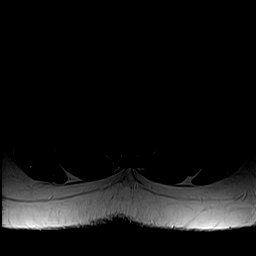
[im 28/40]
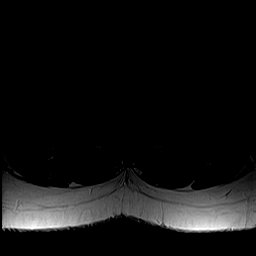
[im 32/40]
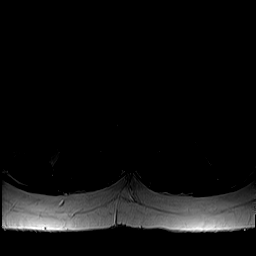
[im 36/40]
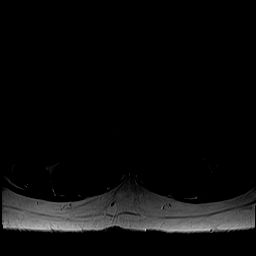
[im 40/40]
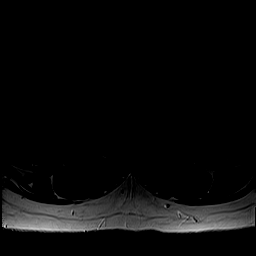

[Series 6: T1 · axial · 4.0mm · 0.39mm/px · z∈[-40,+161]mm · 11 of 40 slices shown (2 of 2)]
[im 1/40]
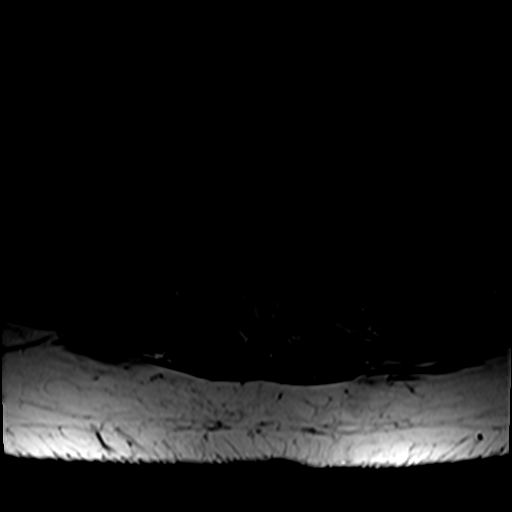
[im 4/40]
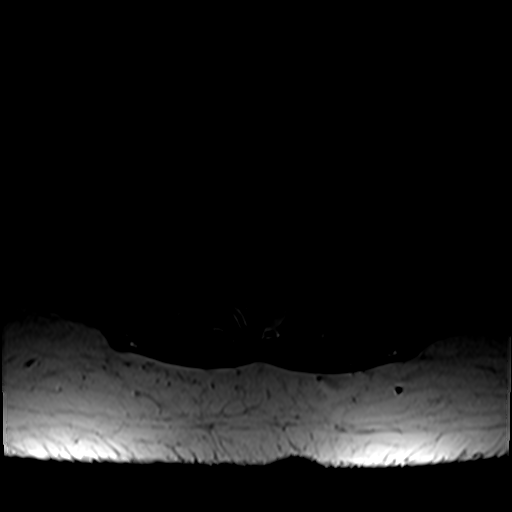
[im 8/40]
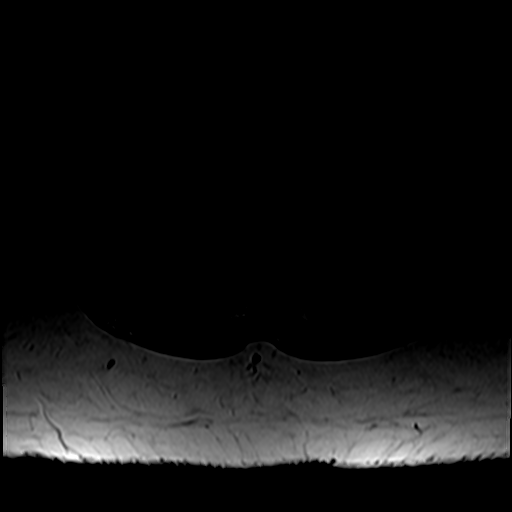
[im 12/40]
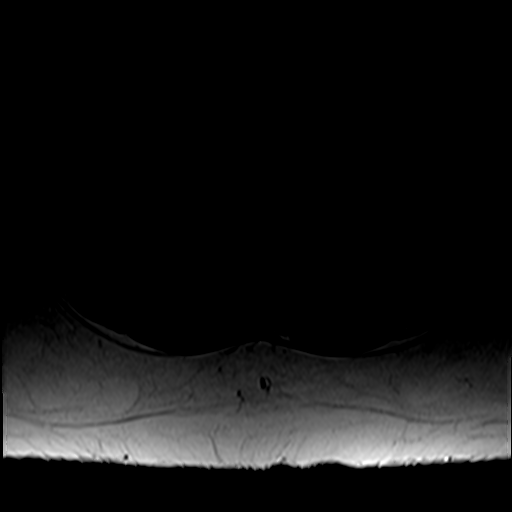
[im 16/40]
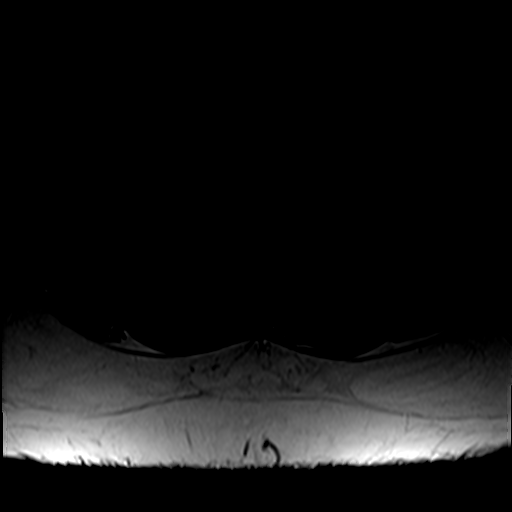
[im 20/40]
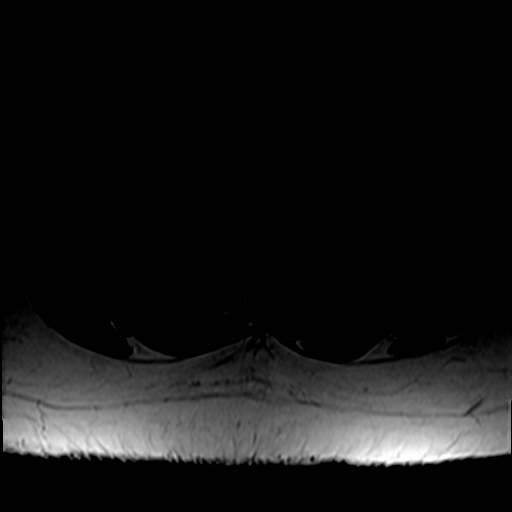
[im 24/40]
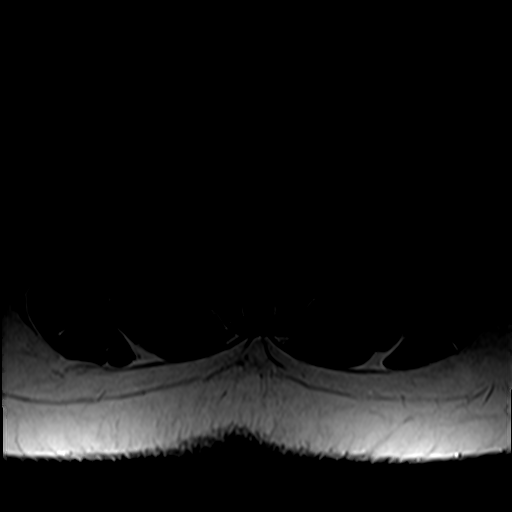
[im 28/40]
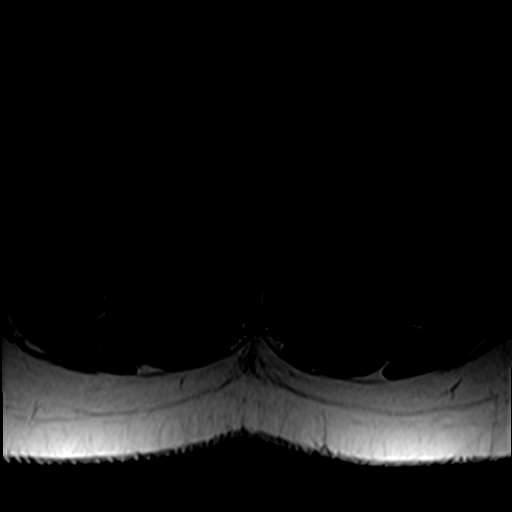
[im 32/40]
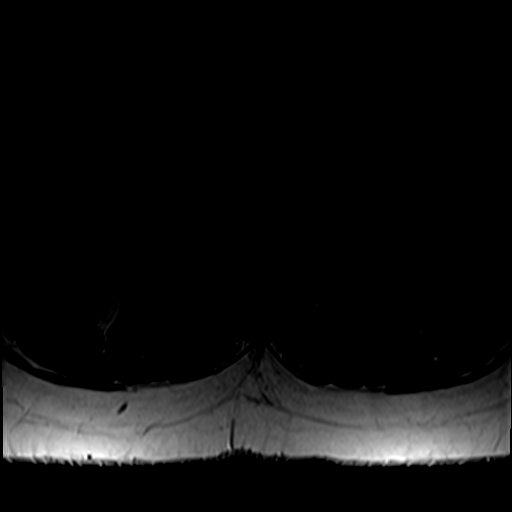
[im 36/40]
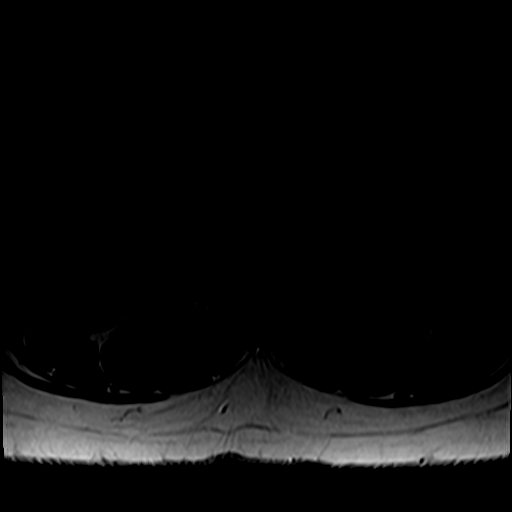
[im 40/40]
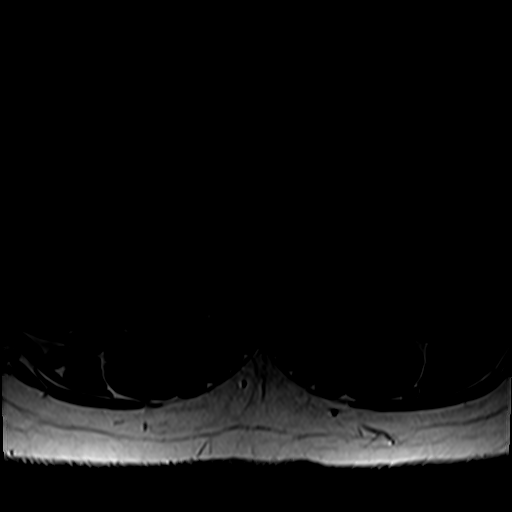

[Series 7: T1 fat-sat post-contrast · sagittal · 4.0mm · 0.81mm/px · 4 of 15 slices shown]
[im 1/15]
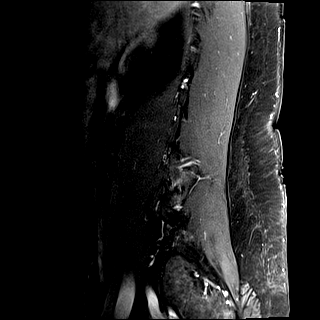
[im 5/15]
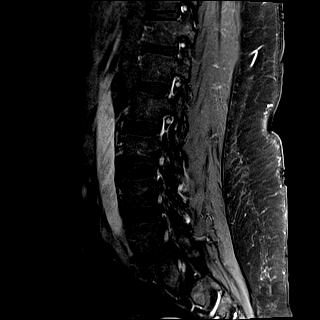
[im 10/15]
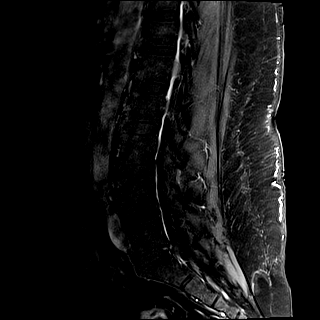
[im 15/15]
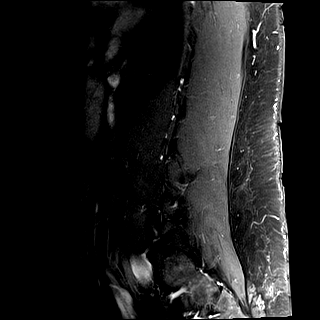

[34 of 48 positions shown; findings below may reference images not displayed]

FINDINGS: The scan covers the region from mid T11 through S3. There is no
finding in the region worrisome for metastatic disease. I believe
the bone scan findings are all degenerative in nature.

There is no significant finding at L2-3 or above. The distal cord
and conus are normal.

L3-4: No disc pathology. There is bilateral facet degeneration with
facet edema and enhancement, right more than left. No stenosis.

L4-5: There is bilateral facet degeneration with edema and
enhancement, left more than right. No stenosis.

L5-S1: There is mild facet degeneration right more than left. There
is disc degeneration with annular fissures an annular bulging but no
herniation, stenosis or neural compression.
IMPRESSION: No evidence of regional metastatic disease.

Lower lumbar facet arthritis responsible for the bone scan
abnormalities. This could also be associated with back pain or
referred facet syndrome pain.

## 2016-12-22 ENCOUNTER — Encounter: Payer: Self-pay | Admitting: Podiatry

## 2016-12-22 ENCOUNTER — Ambulatory Visit (INDEPENDENT_AMBULATORY_CARE_PROVIDER_SITE_OTHER): Payer: Medicare HMO | Admitting: Podiatry

## 2016-12-22 DIAGNOSIS — M79676 Pain in unspecified toe(s): Secondary | ICD-10-CM

## 2016-12-22 DIAGNOSIS — B351 Tinea unguium: Secondary | ICD-10-CM

## 2016-12-22 DIAGNOSIS — Q828 Other specified congenital malformations of skin: Secondary | ICD-10-CM

## 2016-12-22 DIAGNOSIS — E119 Type 2 diabetes mellitus without complications: Secondary | ICD-10-CM | POA: Diagnosis not present

## 2016-12-22 NOTE — Progress Notes (Signed)
Subjective:     Patient ID: JOHNISHA LOUKS, female   DOB: Apr 10, 1961, 56 y.o.   MRN: [id number]  HPI this patient presents the office with chief complaint of painful feet. She says that she has painful nails and painful calluses which she hasn't had worked on for a long period of time.   She says her nails have grown thick and long and are painful walking and wearing her shoes. Patient has very dry and callus, skin on the bottom of her feet with local areas of severe callus pain. She presents the office today for an evaluation and treatment of her painful feet. She also is a diabetic with a diagnosis of neuropathy   Review of Systems     Objective:   Physical Exam GENERAL APPEARANCE: Alert, conversant. Appropriately groomed. No acute distress.  VASCULAR: Pedal pulses are  palpable at  [facility] and PT bilateral.  Capillary refill time is immediate to all digits,  Normal temperature gradient.   NEUROLOGIC: sensation is normal to 5.07 monofilament at 5/5 sites bilateral.  Light touch is intact bilateral, Muscle strength normal.  MUSCULOSKELETAL: acceptable muscle strength, tone and stability bilateral.   DJD midfoot right.  Hammer toe 2,5  B/L.  Plantarflexed 2,5 metatarsal right foot.  DERMATOLOGIC: skin color, texture, and turgor are within normal limits.  No preulcerative lesions or ulcers  are seen, no interdigital maceration noted.  No open lesions present.  Multiple keratotic lesions both feet.  Severe porokeratosis sub 1,5 left and sub 2,5 right.  Painful right heel callus.No drainage noted.  NAILS  Thick disfigured discolored nails both feet.     Assessment:     Onychomycosis  B/L  Porokeratosis  B/L  Diabetes    Plan:     IE  Debridemnt of nails  Debridement of callus. Patient had severe dry scaly, callus  skin on the bottom of both feet. In addition to her localized porokeratotic lesions  . She should return the office in 10 weeks for further evaluation and treatment.    Gardiner Barefoot  DPM

## 2017-01-19 ENCOUNTER — Encounter: Payer: Self-pay | Admitting: Hematology and Oncology

## 2017-01-25 ENCOUNTER — Encounter: Payer: Self-pay | Admitting: Internal Medicine

## 2017-01-25 ENCOUNTER — Ambulatory Visit (INDEPENDENT_AMBULATORY_CARE_PROVIDER_SITE_OTHER): Payer: Medicare HMO | Admitting: Internal Medicine

## 2017-01-25 VITALS — BP 144/86 | HR 73 | Ht 68.0 in | Wt 203.6 lb

## 2017-01-25 DIAGNOSIS — E119 Type 2 diabetes mellitus without complications: Secondary | ICD-10-CM | POA: Diagnosis not present

## 2017-01-25 DIAGNOSIS — L84 Corns and callosities: Secondary | ICD-10-CM

## 2017-01-25 DIAGNOSIS — Z23 Encounter for immunization: Secondary | ICD-10-CM | POA: Diagnosis not present

## 2017-01-25 DIAGNOSIS — I10 Essential (primary) hypertension: Secondary | ICD-10-CM | POA: Diagnosis not present

## 2017-01-25 MED ORDER — AMLODIPINE BESYLATE 5 MG PO TABS: 5.0000 mg | ORAL_TABLET | Freq: Every day | ORAL | 3 refills | Status: DC

## 2017-01-25 MED ORDER — TRIAMTERENE-HCTZ 37.5-25 MG PO TABS: 1.0000 | ORAL_TABLET | Freq: Every day | ORAL | 3 refills | Status: DC

## 2017-01-25 MED ORDER — METFORMIN HCL 500 MG PO TABS
500.0000 mg | ORAL_TABLET | Freq: Every morning | ORAL | 3 refills | Status: DC
Start: 2017-01-25 — End: 2017-11-15

## 2017-01-25 NOTE — Patient Instructions (Signed)

## 2017-01-25 NOTE — Progress Notes (Signed)
Date:  01/25/2017   Name:  Donna Riley   DOB:  Mar 03, 1961   MRN:  [id number]   Chief Complaint: Diabetes (Needs refill on Metformin. Haven't checked BS recently. ) and Hypertension (Needs refill on amlodipine, and lisinopril. ) Diabetes  She presents for her follow-up diabetic visit. She has type 2 diabetes mellitus. Her disease course has been stable. Pertinent negatives for hypoglycemia include no headaches or tremors. Pertinent negatives for diabetes include no chest pain, no fatigue, no polydipsia and no polyuria.  Hypertension  This is a chronic problem. The current episode started more than 1 year ago. The problem has been waxing and waning since onset. The problem is uncontrolled. Pertinent negatives include no chest pain, headaches, palpitations or shortness of breath. There are no associated agents to hypertension. Past treatments include calcium channel blockers and ACE inhibitors. The current treatment provides moderate improvement. There is no history of kidney disease or heart failure.    Lab Results  Component Value Date   HGBA1C 6.1 (H) 02/12/2016     Review of Systems  Constitutional: Negative for appetite change, fatigue, fever and unexpected weight change.  HENT: Negative for tinnitus and trouble swallowing.   Eyes: Negative for visual disturbance.  Respiratory: Negative for cough, chest tightness and shortness of breath.   Cardiovascular: Negative for chest pain, palpitations and leg swelling.  Gastrointestinal: Negative for abdominal pain.  Endocrine: Negative for polydipsia and polyuria.  Genitourinary: Negative for dysuria and hematuria.       Still has breast pain and tenderness s/p surgery but no longer on pain medication or gabapentin  Musculoskeletal: Negative for arthralgias.  Neurological: Negative for tremors, numbness and headaches.  Psychiatric/Behavioral: Negative for dysphoric mood.    Patient Active Problem List   Diagnosis Date Noted  .  Mastitis 10/15/2016  . Fever and chills   . Breast pain, left 10/14/2016  . Arthritis   . Elevated LFTs   . Personal history of chemotherapy   . Hypertension   . Positive PPD, treated   . Personal history of radiation therapy   . Fatigue 08/01/2016  . Umbilical hernia without obstruction and without gangrene 07/13/2016  . Breast wound, left, sequela 06/23/2016  . Special screening for malignant neoplasms, colon   . Rectal polyp   . Irritable bowel syndrome with both constipation and diarrhea 07/20/2015  . Controlled type 2 diabetes mellitus without complication, without long-term current use of insulin ([place]) 06/22/2015  . Pre-ulcerative calluses 06/22/2015  . Neuropathy 06/22/2015  . Hot flash, menopausal 04/09/2015  . Essential (primary) hypertension 03/25/2015  . 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 Osteopenia 03/12/2015  . Hydradenitis 01/23/2015  . Diabetes mellitus without complication ([place]) 75/64/3329  . Breast cancer ([place]) 05/30/2014  . 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 Authorizing Provider  amLODipine (NORVASC) 5 MG tablet Take 1 tablet (5 mg total) by mouth daily. 10/25/16  Yes Pollak, Fabio Bering M, PA-C  anastrozole (ARIMIDEX) 1 MG tablet TAKE ONE TABLET BY MOUTH EVERY DAY 11/03/16  Yes Lequita Asal, MD  aspirin EC 81 MG tablet Take 81 mg by mouth daily.   Yes [provider]  lisinopril (PRINIVIL,ZESTRIL) 40 MG tablet Take 1 tablet (40 mg total) by mouth daily. 10/25/16  Yes Carles Collet M, PA-C  metFORMIN (GLUCOPHAGE) 500 MG tablet Take 1 tablet (500 mg total) by mouth every morning. 10/25/16  Yes Terrilee Croak,  Wendee Beavers, PA-C  oxyCODONE (OXY IR/ROXICODONE) 5 MG immediate release tablet Take 1 tablet (5 mg total) by mouth every 6 (six) hours as needed for severe pain. 10/27/16  Yes Clayburn Pert, MD    No Known Allergies  Past Surgical History:  Procedure Laterality  Date  . BREAST BIOPSY Left 07/31/2014   positive. Lumpectomy 08/27/2014 with chemo and rad  . BREAST BIOPSY Left 03/08/2016   path pending  . BREAST LUMPECTOMY WITH NEEDLE LOCALIZATION Left 04/05/2016   Procedure: BREAST LUMPECTOMY WITH NEEDLE LOCALIZATION;  Surgeon: Hubbard Robinson, MD;  Location: [facility];  Service: General;  Laterality: Left;  . BREAST LUMPECTOMY WITH NEEDLE LOCALIZATION AND AXILLARY SENTINEL LYMPH NODE BX Left 08/27/14  . CHOLECYSTECTOMY    . COLONOSCOPY WITH PROPOFOL N/A 08/28/2015   Procedure: COLONOSCOPY WITH PROPOFOL;  Surgeon: Lucilla Lame, MD;  Location: [place];  Service: Endoscopy;  Laterality: N/A;  Diabetic oral  . ESOPHAGOGASTRODUODENOSCOPY  2014   gastritis  . HERNIA REPAIR    . POLYPECTOMY  08/28/2015   Procedure: POLYPECTOMY INTESTINAL;  Surgeon: Lucilla Lame, MD;  Location: [place];  Service: Endoscopy;;  Rectal polyp  . TUBAL LIGATION Bilateral   . UMBILICAL HERNIA REPAIR N/A 08/09/2016   Procedure: HERNIA REPAIR UMBILICAL ADULT;  Surgeon: Olean Ree, MD;  Location: [facility];  Service: General;  Laterality: N/A;  . VENTRAL HERNIA REPAIR N/A 04/10/2015   Procedure: HERNIA REPAIR VENTRAL ADULT;  Surgeon: Leonie Green, MD;  Location: [facility];  Service: General;  Laterality: N/A;    Social History  Substance Use Topics  . Smoking status: Current Every Day Smoker    Packs/day: 0.25    Years: 38.00    Types: Cigarettes  . Smokeless tobacco: Never Used     Comment: Smoke 8 cigerattes a day.   . Alcohol use Yes     Comment: Occasionally      Medication list has been reviewed and updated.  PHQ 2/9 Scores 04/18/2016 09/16/2015  PHQ - 2 Score 0 0    Physical Exam  Constitutional: She is oriented to person, place, and time. She appears well-developed. No distress.  HENT:  Head: Normocephalic and atraumatic.  Neck: Normal range of motion. Neck supple. No thyromegaly present.  Cardiovascular: Normal rate, regular rhythm  and normal heart sounds.   No murmur heard. Pulmonary/Chest: Effort normal. No respiratory distress.  Musculoskeletal: She exhibits no edema or tenderness.  Neurological: She is alert and oriented to person, place, and time.  Skin: Skin is warm and dry. No rash noted.  Psychiatric: She has a normal mood and affect. Her behavior is normal. Thought content normal.  Nursing note and vitals reviewed.   BP (!) 144/86   Pulse 73   Ht 5\' 8"  (1.727 m)   Wt 203 lb 9.6 oz (92.4 kg)   SpO2 99%   BMI 30.96 kg/m   Assessment and Plan: 1. Essential (primary) hypertension Not controlled - add Maxzide - triamterene-hydrochlorothiazide (MAXZIDE-25) 37.5-25 MG tablet; Take 1 tablet by mouth daily.  Dispense: 90 tablet; Refill: 3 - amLODipine (NORVASC) 5 MG tablet; Take 1 tablet (5 mg total) by mouth daily.  Dispense: 90 tablet; Refill: 3  2. Controlled type 2 diabetes mellitus without complication, without long-term current use of insulin (HCC) Continue oral agents Schedule diabetic eye exam - metFORMIN (GLUCOPHAGE) 500 MG tablet; Take 1 tablet (500 mg total) by mouth every morning.  Dispense: 90 tablet; Refill: 3 - Hemoglobin A1c  3. Pre-ulcerative  calluses Followed by Podiatry  4. Need for pneumococcal vaccination - Pneumococcal polysaccharide vaccine 23-valent greater than or equal to 2yo subcutaneous/IM   Meds ordered this encounter  Medications  . triamterene-hydrochlorothiazide (MAXZIDE-25) 37.5-25 MG tablet    Sig: Take 1 tablet by mouth daily.    Dispense:  90 tablet    Refill:  3  . metFORMIN (GLUCOPHAGE) 500 MG tablet    Sig: Take 1 tablet (500 mg total) by mouth every morning.    Dispense:  90 tablet    Refill:  3  . amLODipine (NORVASC) 5 MG tablet    Sig: Take 1 tablet (5 mg total) by mouth daily.    Dispense:  90 tablet    Refill:  3    Partially dictated using Editor, commissioning. Any errors are unintentional.  Halina Maidens, MD [facility]  01/25/2017

## 2017-01-26 LAB — HEMOGLOBIN A1C
Est. average glucose Bld gHb Est-mCnc: 128 mg/dL
Hgb A1c MFr Bld: 6.1 % — ABNORMAL HIGH (ref 4.8–5.6)

## 2017-02-21 ENCOUNTER — Inpatient Hospital Stay: Payer: Medicare HMO | Admitting: Hematology and Oncology

## 2017-02-21 ENCOUNTER — Inpatient Hospital Stay: Payer: Medicare HMO

## 2017-02-23 ENCOUNTER — Inpatient Hospital Stay: Payer: Medicare HMO | Attending: Hematology and Oncology

## 2017-02-23 ENCOUNTER — Inpatient Hospital Stay (HOSPITAL_BASED_OUTPATIENT_CLINIC_OR_DEPARTMENT_OTHER): Payer: Medicare HMO | Admitting: Hematology and Oncology

## 2017-02-23 ENCOUNTER — Other Ambulatory Visit: Payer: Self-pay | Admitting: *Deleted

## 2017-02-23 ENCOUNTER — Encounter: Payer: Self-pay | Admitting: Hematology and Oncology

## 2017-02-23 VITALS — BP 154/90 | HR 74 | Temp 96.5°F | Resp 18 | Wt 204.6 lb

## 2017-02-23 DIAGNOSIS — M8580 Other specified disorders of bone density and structure, unspecified site: Secondary | ICD-10-CM | POA: Diagnosis not present

## 2017-02-23 DIAGNOSIS — Z79899 Other long term (current) drug therapy: Secondary | ICD-10-CM | POA: Insufficient documentation

## 2017-02-23 DIAGNOSIS — Z923 Personal history of irradiation: Secondary | ICD-10-CM

## 2017-02-23 DIAGNOSIS — Z79811 Long term (current) use of aromatase inhibitors: Secondary | ICD-10-CM | POA: Diagnosis not present

## 2017-02-23 DIAGNOSIS — C50912 Malignant neoplasm of unspecified site of left female breast: Secondary | ICD-10-CM | POA: Insufficient documentation

## 2017-02-23 DIAGNOSIS — F17210 Nicotine dependence, cigarettes, uncomplicated: Secondary | ICD-10-CM | POA: Diagnosis not present

## 2017-02-23 DIAGNOSIS — Z170 Estrogen receptor positive status [ER+]: Secondary | ICD-10-CM | POA: Insufficient documentation

## 2017-02-23 DIAGNOSIS — Z9221 Personal history of antineoplastic chemotherapy: Secondary | ICD-10-CM | POA: Insufficient documentation

## 2017-02-23 DIAGNOSIS — N644 Mastodynia: Secondary | ICD-10-CM

## 2017-02-23 DIAGNOSIS — I10 Essential (primary) hypertension: Secondary | ICD-10-CM | POA: Diagnosis not present

## 2017-02-23 DIAGNOSIS — Z7982 Long term (current) use of aspirin: Secondary | ICD-10-CM | POA: Diagnosis not present

## 2017-02-23 DIAGNOSIS — Z7984 Long term (current) use of oral hypoglycemic drugs: Secondary | ICD-10-CM | POA: Diagnosis not present

## 2017-02-23 DIAGNOSIS — E119 Type 2 diabetes mellitus without complications: Secondary | ICD-10-CM | POA: Diagnosis not present

## 2017-02-23 DIAGNOSIS — Z853 Personal history of malignant neoplasm of breast: Secondary | ICD-10-CM

## 2017-02-23 DIAGNOSIS — R69 Illness, unspecified: Secondary | ICD-10-CM | POA: Diagnosis not present

## 2017-02-23 LAB — CBC WITH DIFFERENTIAL/PLATELET
Basophils Absolute: 0.1 10*3/uL (ref 0–0.1)
Basophils Relative: 1 %
Eosinophils Absolute: 0.1 10*3/uL (ref 0–0.7)
Eosinophils Relative: 1 %
HCT: 40.5 % (ref 35.0–47.0)
Hemoglobin: 13.7 g/dL (ref 12.0–16.0)
Lymphocytes Relative: 33 %
Lymphs Abs: 2.5 10*3/uL (ref 1.0–3.6)
MCH: 27.2 pg (ref 26.0–34.0)
MCHC: 33.7 g/dL (ref 32.0–36.0)
MCV: 80.6 fL (ref 80.0–100.0)
Monocytes Absolute: 0.5 10*3/uL (ref 0.2–0.9)
Monocytes Relative: 7 %
Neutro Abs: 4.3 10*3/uL (ref 1.4–6.5)
Neutrophils Relative %: 58 %
Platelets: 230 10*3/uL (ref 150–440)
RBC: 5.03 MIL/uL (ref 3.80–5.20)
RDW: 14.7 % — ABNORMAL HIGH (ref 11.5–14.5)
WBC: 7.4 10*3/uL (ref 3.6–11.0)

## 2017-02-23 LAB — COMPREHENSIVE METABOLIC PANEL
ALT: 19 U/L (ref 14–54)
AST: 21 U/L (ref 15–41)
Albumin: 4.1 g/dL (ref 3.5–5.0)
Alkaline Phosphatase: 94 U/L (ref 38–126)
Anion gap: 8 (ref 5–15)
BUN: 10 mg/dL (ref 6–20)
CO2: 27 mmol/L (ref 22–32)
Calcium: 9.2 mg/dL (ref 8.9–10.3)
Chloride: 102 mmol/L (ref 101–111)
Creatinine, Ser: 0.71 mg/dL (ref 0.44–1.00)
GFR calc Af Amer: >60 mL/min (ref >60)
GFR calc non Af Amer: >60 mL/min (ref >60)
Glucose, Bld: 87 mg/dL (ref 65–99)
Potassium: 3.9 mmol/L (ref 3.5–5.1)
Sodium: 137 mmol/L (ref 135–145)
Total Bilirubin: 0.5 mg/dL (ref 0.3–1.2)
Total Protein: 7.8 g/dL (ref 6.5–8.1)

## 2017-02-23 NOTE — Progress Notes (Signed)
[facility] day:  02/23/17   Chief Complaint: Donna Riley is a 56 y.o. female with stage IIA left breast cancer who is seen for 4 month assessment.  HPI: The patient was last seen in the medical oncology clinic on 10/21/2016.  At that time, she was doing well on Arimidex.  She had chronic fatigue.  TSH was normal.  She has recently been on Septra for a breast infection.  Exam revealed the left breast wound was healing slowly by secondary intention.  She was seen by Dr. Clayburn Pert and Dr Baruch Gouty on 10/27/2016.  Because of ongoing breast drainage, she was referred to the [facility].  She was seen by Lilyan Punt at [facility] on 11/18/2016.  Exam revealed a very poor cosmetic outcome of the left breast with radiation skin thickening and edema. She had a very fibrotic lumpectomy bed with distortion of the nipple areola complex pulling medially no evidence for cellulitis. There was no nipple discharge or discharge from the surgical scar.  It was recommended that she continue to try to wear the best support bra. She requested narcotics, but was declined.   During the interim, she has had RIGHT side pain that developed about a week ago. Her breast is "leaking through a pin hole".  She denies bleeding; no hematochezia, melena, gross hematuria, and vaginal bleeding. Patient denies any B symptoms. She is eating well; no significant weight loss. Patient continues on Arimidex without noted side effects.    Past Medical History:  Diagnosis Date  . Arthritis    shoulders and neck  . Breast cancer ([place]) 2016    Left breast with chemo + rad tx's with lumpectomy.  . Diabetes mellitus without complication ([place]) 0/2585  . Elevated LFTs   . Hypertension   . Personal history of chemotherapy   . Personal history of radiation therapy   . Positive PPD, treated   . Shortness of breath dyspnea    with exertion    Past Surgical History:  Procedure  Laterality Date  . BREAST BIOPSY Left 07/31/2014   positive. Lumpectomy 08/27/2014 with chemo and rad  . BREAST BIOPSY Left 03/08/2016   path pending  . BREAST LUMPECTOMY WITH NEEDLE LOCALIZATION Left 04/05/2016   Procedure: BREAST LUMPECTOMY WITH NEEDLE LOCALIZATION;  Surgeon: Hubbard Robinson, MD;  Location: [facility];  Service: General;  Laterality: Left;  . BREAST LUMPECTOMY WITH NEEDLE LOCALIZATION AND AXILLARY SENTINEL LYMPH NODE BX Left 08/27/14  . CHOLECYSTECTOMY    . COLONOSCOPY WITH PROPOFOL N/A 08/28/2015   Procedure: COLONOSCOPY WITH PROPOFOL;  Surgeon: Lucilla Lame, MD;  Location: [place];  Service: Endoscopy;  Laterality: N/A;  Diabetic oral  . ESOPHAGOGASTRODUODENOSCOPY  2014   gastritis  . HERNIA REPAIR    . POLYPECTOMY  08/28/2015   Procedure: POLYPECTOMY INTESTINAL;  Surgeon: Lucilla Lame, MD;  Location: [place];  Service: Endoscopy;;  Rectal polyp  . TUBAL LIGATION Bilateral   . UMBILICAL HERNIA REPAIR N/A 08/09/2016   Procedure: HERNIA REPAIR UMBILICAL ADULT;  Surgeon: Olean Ree, MD;  Location: [facility];  Service: General;  Laterality: N/A;  . VENTRAL HERNIA REPAIR N/A 04/10/2015   Procedure: HERNIA REPAIR VENTRAL ADULT;  Surgeon: Leonie Green, MD;  Location: [facility];  Service: General;  Laterality: N/A;    Family History  Problem Relation Age of Onset  . Cancer Mother        Pancreatic  . Cancer Father  Throat  . Cancer Maternal Grandmother        Breast  . Breast cancer Maternal Grandmother 75    Social History:  reports that she has been smoking Cigarettes.  She has a 9.50 pack-year smoking history. She has never used smokeless tobacco. She reports that she drinks alcohol. She reports that she does not use drugs.  She lives in [place].  The patient is alone today.  Allergies: No Known Allergies  Current Medications: Current Outpatient Prescriptions  Medication Sig Dispense Refill  . amLODipine (NORVASC) 5 MG tablet Take  1 tablet (5 mg total) by mouth daily. 90 tablet 3  . anastrozole (ARIMIDEX) 1 MG tablet TAKE ONE TABLET BY MOUTH EVERY DAY 90 tablet 0  . aspirin EC 81 MG tablet Take 81 mg by mouth daily.    Marland Kitchen lisinopril (PRINIVIL,ZESTRIL) 40 MG tablet Take 1 tablet (40 mg total) by mouth daily. 90 tablet 3  . metFORMIN (GLUCOPHAGE) 500 MG tablet Take 1 tablet (500 mg total) by mouth every morning. 90 tablet 3  . oxyCODONE (OXY IR/ROXICODONE) 5 MG immediate release tablet Take 1 tablet (5 mg total) by mouth every 6 (six) hours as needed for severe pain. (Patient not taking: Reported on 01/25/2017) 20 tablet 0  . triamterene-hydrochlorothiazide (MAXZIDE-25) 37.5-25 MG tablet Take 1 tablet by mouth daily. (Patient not taking: Reported on 02/23/2017) 90 tablet 3   No current facility-administered medications for this visit.     Review of Systems:  GENERAL: Feels "ok".  No fevers or sweats.  Weight stable. PERFORMANCE STATUS (ECOG): 1 HEENT: No visual changes, runny nose, sore throat, mouth sores or tenderness. Lungs: No shortness of breath or cough. No hemoptysis. Cardiac: No chest pain, palpitations, orthopnea, or PND. Breast: Wound healing by secondary intention ("pinhole leaking fluid").  Recent infection and hospitalization. GI: Right sided abdominal discomfort.  Constipation.  No nausea, vomiting, melena or hematochezia.  s/p colonoscsopy 08/28/2015. GU: No urgency, frequency, dysuria, or hematuria. Musculoskeletal: No back pain. No joint pain. No muscle tenderness. Extremities: No pain or swelling. Skin: No skin changes, rashes or ulcers. Neuro:No headache, numbness or weakness, balance or coordination issues. Endocrine: Hot flashes.  No diabetes, thyroid issues or night sweats. Psych: Trouble sleeping at night.  No mood changes, depression or anxiety. Pain: Right sided abdominal discomfort. Review of systems: All other systems reviewed and found to be negative.  Physical Exam: Blood  pressure (!) 154/90, pulse 74, temperature (!) 96.5 F (35.8 C), temperature source Tympanic, resp. rate 18, weight 204 lb 9.6 oz (92.8 kg). GENERAL: Well developed, well nourished, woman sitting comfortably in the exam room in no acute distress. MENTAL STATUS: Alert and oriented to person, place and time. HEAD: Long brown braids. Normocephalic, atraumatic, face symmetric, no Cushingoid features. EYES: Brown eyes. Pupils equal round and reactive to light and accomodation. No conjunctivitis or scleral icterus. ENT: Oropharynx clear without lesion. Tongue normal. Mucous membranes moist.  RESPIRATORY: Clear to auscultation without rales, wheezes or rhonchi. CARDIOVASCULAR: Regular rate and rhythm without murmur, rub or gallop. BREASTS:  Right breast without masses, skin changes or nipple discharge.  Left breast with moderate post surgical and post radiation changes with edema.  Arched incision.  Tiny pinhole along incision without fluid.  Nipple areola complex pulling medially.  ABDOMEN: Ventral hernia.  Soft, minimally tender right sided abdomen without guarding or rebound tenderness. Active bowel sounds and no hepatosplenomegaly. No masses. BACK:  No CVA tenderness. SKIN:  No rashes, ulcers or lesions. EXTREMITIES: No  edema, no skin discoloration or tenderness. No palpable cords. LYMPH NODES: No palpable cervical, supraclavicular, axillary or inguinal adenopathy  NEUROLOGICAL: Unremarkable.  PSYCH: Appropriate.   No visits with results within 3 Day(s) from this visit.  Latest known visit with results is:  Office Visit on 01/25/2017  Component Date Value Ref Range Status  . Hgb A1c MFr Bld 01/25/2017 6.1* 4.8 - 5.6 % Final   Comment:          Prediabetes: 5.7 - 6.4          Diabetes: >6.4          Glycemic control for adults with diabetes: <7.0   . Est. average glucose Bld gHb Est-m* 01/25/2017 128  mg/dL Final    Assessment:  Donna Riley is a 56 y.o. African American  female with stage IIA left breast cancer status post partial mastectomy and sentinel lymph node biopsy on 08/27/2014. 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 There was lymphovascular invasion. One of 2 sentinel lymph nodes were positive (focus of 2.8 mm). Tumor was > 90% ER positive, > 90% PR positive, and Her2/neu negative. Pathologic stage was T1bN1aM0.  Bone scan on 09/16/2014 revealed abnormal focal uptake at the level of right L3 pedicle and L5 vertebral body. Lumbar spine MRI on 09/27/2014 revealed no evidence of metastatic disease with lower lumbar facet arthritis. Abdominal and pelvic CT scan on 09/24/2014 revealed hepatomegaly and no evidence of metastatic disease.   Bone density study on 09/15/2014 revealed osteopenia with a T-score of -2.1 at L1-L4.  She is on calcium and vitamin D.  She received 4 cycles of Taxotere and Cytoxan (09/29/2014 - 12/09/2014) with Neulasta support. She received 50.4 Gy to the left breast from 01/12/2015 until 02/23/2015.  She was started on Femara on 03/12/2015, but switched to Arimidex on 03/30/2015 secondary to diffuse joint aches.    CA27.29 has been followed: 16.9 on 09/09/2014, 12.1 on 06/02/2015, 15.6 on 08/07/2015, 17.4 on 11/23/2015, 13.6 on 02/22/2016, 18.9 on 06/20/2016, and 18.4 on 10/21/2016.  Bilateral diagnostic mammogram on 07/30/2015 revealed no evidence of malignancy.  Left sided mammogram and ultrasound on 02/29/2016 revealed a 1.5 x 0.6 x 0.7 cm intraductal mass.  Bilateral mammogram on 09/12/2016 revealed no focal fluid collection at the surgical site to account for the persistent drainage. There were no suspicious mammographic or targeted sonographic abnormalities at the lumpectomy site. There was no evidence of malignancy in the bilateral breasts.  She had chronic left nipple discharge.  Left breast lumpectomy at the 11 o'clock position on 04/05/2016 revealed an  intraductal papilloma with sclerosis and calcifications. There was fat necrosis with fibrosis and calcifications. Pathology was negative for atypia and malignancy  Colonoscopy on 08/28/2015 revealed one 4 mm polyp in the rectum (tubular adenoma).  Symptomatically, she is doing well on Arimidex.  She has RIGHT sided abdominal discomfort.  Exam reveals stable post operative changes to the LEFT breast.   Plan: 1.  Labs today:  CBC with diff, CMP, CA27.29. 2.  Continue Arimidex. 3.  Refer to plastic surgery for consultation regarding LEFT breast "leaking" and need for cosmetic reconstruction.  4.  RIGHT sided abdominal pain. History of some constipation.  No urinary symptoms.  Benign exam.  Patient to follow up with PCP regarding this complaint.  5.  RTC in 4 months for MD assessment and labs (CBC with diff, CMP, CA27.29).   Honor Loh, NP  02/23/2017, 10:45 AM   I saw and evaluated the patient, participating in the key portions of the service and reviewing pertinent diagnostic studies and records.  I reviewed the nurse practitioner's note and agree with the findings and the plan.  The assessment and plan were discussed with the patient.  A few questions were asked by the patient and answered.   Lequita Asal, MD 02/23/2017,5:05 PM

## 2017-02-23 NOTE — Progress Notes (Signed)
Here for follow up

## 2017-02-24 LAB — CANCER ANTIGEN 27.29: CA 27.29: 15 U/mL (ref 0.0–38.6)

## 2017-02-27 ENCOUNTER — Ambulatory Visit (INDEPENDENT_AMBULATORY_CARE_PROVIDER_SITE_OTHER): Payer: Medicare HMO | Admitting: Internal Medicine

## 2017-02-27 ENCOUNTER — Encounter: Payer: Self-pay | Admitting: Internal Medicine

## 2017-02-27 VITALS — BP 120/68 | HR 71 | Ht 68.0 in | Wt 204.0 lb

## 2017-02-27 DIAGNOSIS — N10 Acute tubulo-interstitial nephritis: Secondary | ICD-10-CM | POA: Diagnosis not present

## 2017-02-27 DIAGNOSIS — R1010 Upper abdominal pain, unspecified: Secondary | ICD-10-CM | POA: Diagnosis not present

## 2017-02-27 LAB — POC URINALYSIS WITH MICROSCOPIC (NON AUTO)MANUAL RESULT
Bilirubin, UA: NEGATIVE
Crystals: 0
Epithelial cells, urine per micros: 0
Glucose, UA: NEGATIVE
Ketones, UA: NEGATIVE
Leukocytes, UA: NEGATIVE
Mucus, UA: 0
Nitrite, UA: NEGATIVE
RBC: 1 M/uL — AB (ref 4.04–5.48)
Spec Grav, UA: >=1.03 — AB (ref 1.010–1.025)
Urobilinogen, UA: 0.2 E.U./dL
WBC Casts, UA: 2
pH, UA: 5 (ref 5.0–8.0)

## 2017-02-27 MED ORDER — CIPROFLOXACIN HCL 250 MG PO TABS: 250.0000 mg | ORAL_TABLET | Freq: Two times a day (BID) | ORAL | 0 refills | Status: AC

## 2017-02-27 MED ORDER — CIPROFLOXACIN HCL 250 MG PO TABS: 250.0000 mg | ORAL_TABLET | Freq: Two times a day (BID) | ORAL | 0 refills | Status: DC

## 2017-02-27 NOTE — Progress Notes (Signed)
Date:  02/27/2017   Name:  Donna Riley   DOB:  [birth year]   MRN:  [id number]   Chief Complaint: Abdominal Pain (Right side hurting and front below ribs has a hard spot there. Has not been lifting or no injury. Having nausea. Normal BM- this morning. )  Abdominal Pain  This is a new problem. The current episode started in the past 7 days. The problem occurs constantly. The problem has been unchanged. The pain is located in the epigastric region, suprapubic region and RUQ. The pain is mild. The quality of the pain is cramping and a sensation of fullness. The abdominal pain radiates to the right flank. Associated symptoms include nausea. Pertinent negatives include no arthralgias, belching, constipation, diarrhea, dysuria, fever, frequency, headaches, hematuria or vomiting. Nothing aggravates the pain. The pain is relieved by nothing.  Hypertension  Pertinent negatives include no chest pain, headaches, palpitations or shortness of breath.   She is s/p cholecystectomy and 2 ventral hernia repairs.    Review of Systems  Constitutional: Negative for chills, fatigue and fever.  Respiratory: Negative for choking, shortness of breath and wheezing.   Cardiovascular: Negative for chest pain and palpitations.  Gastrointestinal: Positive for abdominal pain and nausea. Negative for blood in stool, constipation, diarrhea and vomiting.  Genitourinary: Negative for dysuria, frequency and hematuria.  Musculoskeletal: Negative for arthralgias.  Skin: Negative for rash.  Neurological: Negative for dizziness and headaches.  Psychiatric/Behavioral: Negative for sleep disturbance.    Patient Active Problem List   Diagnosis Date Noted  . Mastitis 10/15/2016  . Fever and chills   . Breast pain, left 10/14/2016  . Arthritis   . Elevated LFTs   . Personal history of chemotherapy   . Positive PPD, treated   . Personal history of radiation therapy   . Fatigue 08/01/2016  . Umbilical hernia without  obstruction and without gangrene 07/13/2016  . Breast wound, left, sequela 06/23/2016  . Special screening for malignant neoplasms, colon   . Rectal polyp   . Irritable bowel syndrome with both constipation and diarrhea 07/20/2015  . Controlled type 2 diabetes mellitus without complication, without long-term current use of insulin ([place]) 06/22/2015  . Pre-ulcerative calluses 06/22/2015  . Neuropathy 06/22/2015  . Hot flash, menopausal 04/09/2015  . Essential (primary) hypertension 03/25/2015  . 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 Osteopenia 03/12/2015  . Hydradenitis 01/23/2015  . Breast cancer ([place]) 05/30/2014  . 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 Authorizing Provider  amLODipine (NORVASC) 5 MG tablet Take 1 tablet (5 mg total) by mouth daily. 01/25/17  Yes Glean Hess, MD  anastrozole (ARIMIDEX) 1 MG tablet TAKE ONE TABLET BY MOUTH EVERY DAY 11/03/16  Yes Lequita Asal, MD  aspirin EC 81 MG tablet Take 81 mg by mouth daily.   Yes [provider]  lisinopril (PRINIVIL,ZESTRIL) 40 MG tablet Take 1 tablet (40 mg total) by mouth daily. 10/25/16  Yes Carles Collet M, PA-C  metFORMIN (GLUCOPHAGE) 500 MG tablet Take 1 tablet (500 mg total) by mouth every morning. 01/25/17  Yes Glean Hess, MD  oxyCODONE (OXY IR/ROXICODONE) 5 MG immediate release tablet Take 1 tablet (5 mg total) by mouth every 6 (six) hours as needed for severe pain. 10/27/16  Yes Clayburn Pert, MD  triamterene-hydrochlorothiazide (MAXZIDE-25) 37.5-25 MG tablet Take 1 tablet by mouth daily. 01/25/17  Yes Glean Hess, MD  No Known Allergies  Past Surgical History:  Procedure Laterality Date  . BREAST BIOPSY Left 07/31/2014   positive. Lumpectomy 08/27/2014 with chemo and rad  . BREAST BIOPSY Left 03/08/2016   path pending  . BREAST LUMPECTOMY WITH NEEDLE LOCALIZATION Left 04/05/2016   Procedure:  BREAST LUMPECTOMY WITH NEEDLE LOCALIZATION;  Surgeon: Hubbard Robinson, MD;  Location: [facility];  Service: General;  Laterality: Left;  . BREAST LUMPECTOMY WITH NEEDLE LOCALIZATION AND AXILLARY SENTINEL LYMPH NODE BX Left 08/27/14  . CHOLECYSTECTOMY  09/07/2013  . COLONOSCOPY WITH PROPOFOL N/A 08/28/2015   Procedure: COLONOSCOPY WITH PROPOFOL;  Surgeon: Lucilla Lame, MD;  Location: [place];  Service: Endoscopy;  Laterality: N/A;  Diabetic oral  . ESOPHAGOGASTRODUODENOSCOPY  2014   gastritis  . POLYPECTOMY  08/28/2015   Procedure: POLYPECTOMY INTESTINAL;  Surgeon: Lucilla Lame, MD;  Location: [place];  Service: Endoscopy;;  Rectal polyp  . TUBAL LIGATION Bilateral   . UMBILICAL HERNIA REPAIR N/A 08/09/2016   Procedure: HERNIA REPAIR UMBILICAL ADULT;  Surgeon: Olean Ree, MD;  Location: [facility];  Service: General;  Laterality: N/A;  . VENTRAL HERNIA REPAIR N/A 04/10/2015   Procedure: HERNIA REPAIR VENTRAL ADULT;  Surgeon: Leonie Green, MD;  Location: [facility];  Service: General;  Laterality: N/A;    Social History  Substance Use Topics  . Smoking status: Current Every Day Smoker    Packs/day: 0.25    Years: 38.00    Types: Cigarettes  . Smokeless tobacco: Never Used     Comment: Smoke 8 cigerattes a day.   . Alcohol use Yes     Comment: Occasionally      Medication list has been reviewed and updated.  PHQ 2/9 Scores 04/18/2016 09/16/2015  PHQ - 2 Score 0 0    Physical Exam  Constitutional: She is oriented to person, place, and time. She appears well-developed. No distress.  HENT:  Head: Normocephalic and atraumatic.  Neck: Normal range of motion. No thyromegaly present.  Cardiovascular: Normal rate, regular rhythm and normal heart sounds.   Pulmonary/Chest: Effort normal and breath sounds normal. No respiratory distress.  Abdominal: Soft. Normal appearance and bowel sounds are normal. There is no hepatosplenomegaly. There is tenderness in the right  upper quadrant, epigastric area and suprapubic area. There is CVA tenderness. There is no rebound. No hernia.  Musculoskeletal: Normal range of motion.  Neurological: She is alert and oriented to person, place, and time.  Skin: Skin is warm and dry. No rash noted.  Psychiatric: She has a normal mood and affect. Her behavior is normal. Thought content normal.  Nursing note and vitals reviewed.  Urine dipstick shows positive for RBC's.  Micro exam: 30 WBC's and 2 casts per HPF.   BP 120/68   Pulse 71   Ht 5\' 8"  (1.727 m)   Wt 204 lb (92.5 kg)   SpO2 98%   BMI 31.02 kg/m   Assessment and Plan: 1. Pain of upper abdomen - POC urinalysis w microscopic (non auto)  2. Acute pyelonephritis Increase water intake Call if no improvement - ciprofloxacin (CIPRO) 250 MG tablet; Take 1 tablet (250 mg total) by mouth 2 (two) times daily.  Dispense: 20 tablet; Refill: 0   Meds ordered this encounter  Medications  . DISCONTD: ciprofloxacin (CIPRO) 250 MG tablet    Sig: Take 1 tablet (250 mg total) by mouth 2 (two) times daily.    Dispense:  20 tablet    Refill:  0  . ciprofloxacin (  CIPRO) 250 MG tablet    Sig: Take 1 tablet (250 mg total) by mouth 2 (two) times daily.    Dispense:  20 tablet    Refill:  0    Partially dictated using Editor, commissioning. Any errors are unintentional.  Halina Maidens, MD [facility]  02/27/2017

## 2017-03-01 ENCOUNTER — Telehealth: Payer: Self-pay

## 2017-03-01 NOTE — Telephone Encounter (Signed)
Pt called stating still hurting and no better after antibiotic. Told patient to finish antibiotics and then call back if no better then. Drink plenty of fluids during this course. She verbalized understanding.

## 2017-03-17 DIAGNOSIS — R69 Illness, unspecified: Secondary | ICD-10-CM | POA: Diagnosis not present

## 2017-03-22 ENCOUNTER — Other Ambulatory Visit: Payer: Self-pay | Admitting: Hematology and Oncology

## 2017-03-27 ENCOUNTER — Ambulatory Visit: Payer: Medicare HMO | Admitting: Podiatry

## 2017-04-09 DIAGNOSIS — M62830 Muscle spasm of back: Secondary | ICD-10-CM | POA: Diagnosis not present

## 2017-04-09 DIAGNOSIS — Z923 Personal history of irradiation: Secondary | ICD-10-CM | POA: Diagnosis not present

## 2017-04-09 DIAGNOSIS — Z8542 Personal history of malignant neoplasm of other parts of uterus: Secondary | ICD-10-CM | POA: Diagnosis not present

## 2017-04-09 DIAGNOSIS — R69 Illness, unspecified: Secondary | ICD-10-CM | POA: Diagnosis not present

## 2017-04-09 DIAGNOSIS — M25552 Pain in left hip: Secondary | ICD-10-CM | POA: Diagnosis not present

## 2017-04-09 DIAGNOSIS — Z853 Personal history of malignant neoplasm of breast: Secondary | ICD-10-CM | POA: Diagnosis not present

## 2017-04-09 DIAGNOSIS — I10 Essential (primary) hypertension: Secondary | ICD-10-CM | POA: Diagnosis not present

## 2017-04-09 DIAGNOSIS — Z79899 Other long term (current) drug therapy: Secondary | ICD-10-CM | POA: Diagnosis not present

## 2017-04-09 DIAGNOSIS — Z9221 Personal history of antineoplastic chemotherapy: Secondary | ICD-10-CM | POA: Diagnosis not present

## 2017-04-09 DIAGNOSIS — M545 Low back pain: Secondary | ICD-10-CM | POA: Diagnosis not present

## 2017-04-09 DIAGNOSIS — E119 Type 2 diabetes mellitus without complications: Secondary | ICD-10-CM | POA: Diagnosis not present

## 2017-04-18 DIAGNOSIS — M545 Low back pain: Secondary | ICD-10-CM | POA: Diagnosis not present

## 2017-04-18 DIAGNOSIS — M1712 Unilateral primary osteoarthritis, left knee: Secondary | ICD-10-CM | POA: Diagnosis not present

## 2017-05-05 ENCOUNTER — Other Ambulatory Visit: Payer: Self-pay | Admitting: *Deleted

## 2017-05-05 MED ORDER — ANASTROZOLE 1 MG PO TABS: ORAL_TABLET | ORAL | 0 refills | Status: DC

## 2017-05-11 DIAGNOSIS — D2370 Other benign neoplasm of skin of unspecified lower limb, including hip: Secondary | ICD-10-CM | POA: Diagnosis not present

## 2017-05-11 DIAGNOSIS — M79671 Pain in right foot: Secondary | ICD-10-CM | POA: Diagnosis not present

## 2017-05-11 DIAGNOSIS — E119 Type 2 diabetes mellitus without complications: Secondary | ICD-10-CM | POA: Diagnosis not present

## 2017-05-11 DIAGNOSIS — M79672 Pain in left foot: Secondary | ICD-10-CM | POA: Diagnosis not present

## 2017-05-18 ENCOUNTER — Ambulatory Visit (INDEPENDENT_AMBULATORY_CARE_PROVIDER_SITE_OTHER): Payer: Medicare HMO

## 2017-05-18 VITALS — BP 140/70 | HR 84 | Temp 98.2°F | Resp 16 | Ht 68.0 in | Wt 205.8 lb

## 2017-05-18 DIAGNOSIS — Z0000 Encounter for general adult medical examination without abnormal findings: Secondary | ICD-10-CM

## 2017-05-18 NOTE — Progress Notes (Signed)
Subjective:   Donna Riley is a 56 y.o. female who presents for an Initial Medicare Annual Wellness Visit.  Review of Systems    N/A  Cardiac Risk Factors include: diabetes mellitus;obesity (BMI >30kg/m2);smoking/ tobacco exposure;sedentary lifestyle     Objective:    Today's Vitals   05/18/17 1043  BP: 140/70  Pulse: 84  Resp: 16  Temp: 98.2 F (36.8 C)  TempSrc: Oral  Weight: 205 lb 12.8 oz (93.4 kg)  Height: 5\' 8"  (1.727 m)   Body mass index is 31.29 kg/m.  Advanced Directives 05/18/2017 02/23/2017 10/21/2016 10/15/2016 06/20/2016 04/18/2016 04/18/2016  Does Patient Have a Medical Advance Directive? No No No No No No No  Would patient like information on creating a medical advance directive? Yes (MAU/Ambulatory/Procedural Areas - Information given) No - Patient declined - Yes (ED - Information included in AVS) - - No - patient declined information    Current Medications (verified) Outpatient Encounter Medications as of 05/18/2017  Medication Sig  . acetaminophen (TYLENOL) 325 MG tablet Take 650 mg by mouth daily as needed.  Marland Kitchen amLODipine (NORVASC) 5 MG tablet Take 1 tablet (5 mg total) by mouth daily.  Marland Kitchen anastrozole (ARIMIDEX) 1 MG tablet TAKE ONE (1) TABLET EACH DAY  . aspirin EC 81 MG tablet Take 81 mg by mouth daily.  Marland Kitchen lisinopril (PRINIVIL,ZESTRIL) 40 MG tablet Take 1 tablet (40 mg total) by mouth daily.  . metFORMIN (GLUCOPHAGE) 500 MG tablet Take 1 tablet (500 mg total) by mouth every morning.  Marland Kitchen oxyCODONE (OXY IR/ROXICODONE) 5 MG immediate release tablet Take 1 tablet (5 mg total) by mouth every 6 (six) hours as needed for severe pain.  Marland Kitchen triamterene-hydrochlorothiazide (MAXZIDE-25) 37.5-25 MG tablet Take 1 tablet by mouth daily.   No facility-administered encounter medications on file as of 05/18/2017.     Allergies (verified) Patient has no known allergies.   History: Past Medical History:  Diagnosis Date  . Arthritis    shoulders and neck  . Breast  cancer ([place]) 2016    Left breast with chemo + rad tx's with lumpectomy.  . Diabetes mellitus without complication ([place]) 0/6269  . Elevated LFTs   . Hypertension   . Personal history of chemotherapy   . Personal history of radiation therapy   . Positive PPD, treated   . Shortness of breath dyspnea    with exertion   Past Surgical History:  Procedure Laterality Date  . BREAST BIOPSY Left 07/31/2014   positive. Lumpectomy 08/27/2014 with chemo and rad  . BREAST BIOPSY Left 03/08/2016   path pending  . BREAST LUMPECTOMY WITH NEEDLE LOCALIZATION Left 04/05/2016   Procedure: BREAST LUMPECTOMY WITH NEEDLE LOCALIZATION;  Surgeon: Hubbard Robinson, MD;  Location: [facility];  Service: General;  Laterality: Left;  . BREAST LUMPECTOMY WITH NEEDLE LOCALIZATION AND AXILLARY SENTINEL LYMPH NODE BX Left 08/27/14  . CHOLECYSTECTOMY  09/07/2013  . COLONOSCOPY WITH PROPOFOL N/A 08/28/2015   Procedure: COLONOSCOPY WITH PROPOFOL;  Surgeon: Lucilla Lame, MD;  Location: [facility];  Service: Endoscopy;  Laterality: N/A;  Diabetic oral  . ESOPHAGOGASTRODUODENOSCOPY  2014   gastritis  . POLYPECTOMY  08/28/2015   Procedure: POLYPECTOMY INTESTINAL;  Surgeon: Lucilla Lame, MD;  Location: [facility];  Service: Endoscopy;;  Rectal polyp  . TUBAL LIGATION Bilateral   . UMBILICAL HERNIA REPAIR N/A 08/09/2016   Procedure: HERNIA REPAIR UMBILICAL ADULT;  Surgeon: Olean Ree, MD;  Location: [facility];  Service: General;  Laterality: N/A;  . VENTRAL  HERNIA REPAIR N/A 04/10/2015   Procedure: HERNIA REPAIR VENTRAL ADULT;  Surgeon: Leonie Green, MD;  Location: [facility];  Service: General;  Laterality: N/A;   Family History  Problem Relation Age of Onset  . Cancer Mother        Pancreatic  . Cancer Father        Throat  . Healthy Sister   . HIV Brother   . Cancer Maternal Grandmother        Breast  . Breast cancer Maternal Grandmother 75   Social History   Socioeconomic History  . Marital  status: Married    Spouse name: None  . Number of children: 4  . Years of education: None  . Highest education level: 10th grade  Social Needs  . Financial resource strain: Not hard at all  . Food insecurity - worry: Never true  . Food insecurity - inability: Never true  . Transportation needs - medical: No  . Transportation needs - non-medical: No  Occupational History  . Occupation: Disabled  Tobacco Use  . Smoking status: Current Every Day Smoker    Packs/day: 0.50    Years: 38.00    Pack years: 19.00    Types: Cigarettes  . Smokeless tobacco: Never Used  . Tobacco comment: Smoke 8 cigerattes a day.   Substance and Sexual Activity  . Alcohol use: Yes    Alcohol/week: 1.2 oz    Types: 2 Cans of beer per week    Comment: Occasionally   . Drug use: No  . Sexual activity: No  Other Topics Concern  . None  Social History Narrative  . None    Tobacco Counseling Ready to quit: No Counseling given: No Comment: Smoke 8 cigerattes a day.    Clinical Intake:  Pre-visit preparation completed: Yes  Pain : No/denies pain     BMI - recorded: 31.29 Nutritional Status: BMI > 30  Obese Nutritional Risks: None Diabetes: Yes CBG done?: No Did pt. bring in CBG monitor from home?: No  How often do you need to have someone help you when you read instructions, pamphlets, or other written materials from your doctor or pharmacy?: 1 - Never  Interpreter Needed?: No  Information entered by :: AEversole, LPN   Activities of Daily Living In your present state of health, do you have any difficulty performing the following activities: 05/18/2017 10/15/2016  Hearing? N N  Comment denies use of hearing aids -  Vision? Y N  Comment reading glasses -  Difficulty concentrating or making decisions? Y N  Comment short term memory loss -  Walking or climbing stairs? Y N  Comment arthritis in both legs -  Dressing or bathing? N N  Doing errands, shopping? N N  Preparing Food and  eating ? N -  Comment denies use of dentures -  Using the Toilet? N -  In the past six months, have you accidently leaked urine? N -  Do you have problems with loss of bowel control? N -  Managing your Medications? N -  Managing your Finances? N -  Housekeeping or managing your Housekeeping? N -  Some recent data might be hidden     Immunizations and Health Maintenance Immunization History  Administered Date(s) Administered  . Influenza,inj,Quad PF,6+ Mos 02/22/2016  . Influenza-Unspecified 01/30/2014  . Pneumococcal Polysaccharide-23 01/25/2017   Health Maintenance Due  Topic Date Due  . OPHTHALMOLOGY EXAM  02/25/1970  . TETANUS/TDAP  02/26/1979  . INFLUENZA VACCINE  12/28/2016    Patient Care Team: Glean Hess, MD as PCP - General (Family Medicine)  Indicate any recent Medical Services you may have received from other than [facility] providers in the past year (date may be approximate).     Assessment:   This is a routine wellness examination for [place].  Hearing/Vision screen Vision Screening Comments: Sees Dr. Wyatt Portela for annual eye exams  Dietary issues and exercise activities discussed: Current Exercise Habits: The patient does not participate in regular exercise at present, Exercise limited by: None identified  Goals    . Exercise 150 min/wk Moderate Activity     Recommend to exercise at least 150 minutes per week       Depression Screen PHQ 2/9 Scores 05/18/2017 04/18/2016 09/16/2015  PHQ - 2 Score 0 0 0    Fall Risk Fall Risk  05/18/2017 04/18/2016 09/16/2015 09/30/2014  Falls in the past year? No No No No    Is the patient's home free of loose throw rugs in walkways, pet beds, electrical cords, etc?   yes      Grab bars in the bathroom? No      Handrails on the stairs?   yes. Stairs on exterior of home      Adequate lighting?   yes   Denies use of a shower chair or elevated toilet seat  Cognitive Function:     6CIT Screen 05/18/2017  What Year?  0 points  What month? 0 points  What time? 0 points  Count back from 20 0 points  Months in reverse 2 points  Repeat phrase 8 points  Total Score 10    Screening Tests Health Maintenance  Topic Date Due  . OPHTHALMOLOGY EXAM  02/25/1970  . TETANUS/TDAP  02/26/1979  . INFLUENZA VACCINE  12/28/2016  . HEMOGLOBIN A1C  07/27/2017  . MAMMOGRAM  09/12/2017  . FOOT EXAM  10/25/2017  . PAP SMEAR  07/19/2018  . COLONOSCOPY  08/27/2020  . PNEUMOCOCCAL POLYSACCHARIDE VACCINE (2) 01/25/2022  . DEXA SCAN  09/14/2024  . HIV Screening  Completed  . Hepatitis C Screening  Addressed    Qualifies for Shingles Vaccine? No  Cancer Screenings: Breast: Up to date on Mammogram? Yes  Completed 09/12/16. Repeat every year Up to date of Bone Density/Dexa? Yes  Completed 09/15/14 Colorectal: Colonoscopy completed 08/28/15. Repeat every 5 years  Additional Screenings: HIV: Completed 10/16/16 Hepatitis C Screening: Completed 08/28/13     Plan:    I have personally reviewed and addressed the Medicare Annual Wellness questionnaire and have noted the following in the patient's chart:  A. Medical and social history B. Use of alcohol, tobacco or illicit drugs  C. Current medications and supplements D. Functional ability and status E.  Nutritional status F.  Physical activity G. Advance directives H. List of other physicians I.  Hospitalizations, surgeries, and ER visits in previous 12 months J.  [place] such as hearing and vision if needed, cognitive and depression L. Referrals and appointments - none  In addition, I have reviewed and discussed with patient certain preventive protocols, quality metrics, and best practice recommendations. A written personalized care plan for preventive services as well as general preventive health recommendations were provided to patient.  Signed,  Aleatha Borer, LPN Nurse Health Advisor  MD Recommendations: Due for flu vaccine. Declined today.  States she received this vaccine at [facility]. Advised to provide a copy of her vaccination record.  Due for Tdap. Declined my offer to administer  today. Education has been provided regarding the importance of this vaccine but still declined. Pt has been advised to call her insurance company to determine her out of pocket expense. Advised he/she may also receive this vaccine at her local pharmacy or Health Dept. Verbalized acceptance and understanding.  Due for diabetic eye exam. Pt verbalized that she needed to schedule a follow up appt with Dr. Wyatt Portela. Pt was advised to ensure Dr. Shella Spearing office send copies of her exam to our office. Verbalized acceptance and understanding.

## 2017-05-18 NOTE — Patient Instructions (Signed)
Donna Riley , Thank you for taking time to come for your Medicare Wellness Visit. I appreciate your ongoing commitment to your health goals. Please review the following plan we discussed and let me know if I can assist you in the future.   Screening recommendations/referrals: Colonoscopy: Up to date Mammogram: Up to date Bone Density: Up to date Recommended yearly ophthalmology/optometry visit for glaucoma screening and checkup Recommended yearly dental visit for hygiene and checkup  Vaccinations: Influenza vaccine: Declined. Please provide a copy of your vaccination record Pneumococcal vaccine: Up to date Tdap vaccine: Declined. Please call your insurance company to determine your out of pocket expense. You may also receive this vaccine at your local pharmacy or Health Dept. Shingles vaccine: Not required until after age 27    Advanced directives: Advance directive discussed with you today. I have provided a copy for you to complete at home and have notarized. Once this is complete please bring a copy in to our office so we can scan it into your chart.  Conditions/risks identified: Recommend to exercise at least 150 minutes per week  Next appointment: You are scheduled to see Dr. Army Melia on 05/26/17 @ 9:00am.   Please schedule your Annual Wellness Visit with your Nurse Health Advisor in one year.   Preventive Care 40-64 Years, Female Preventive care refers to lifestyle choices and visits with your health care provider that can promote health and wellness. What does preventive care include?  A yearly physical exam. This is also called an annual well check.  Dental exams once or twice a year.  Routine eye exams. Ask your health care provider how often you should have your eyes checked.  Personal lifestyle choices, including:  Daily care of your teeth and gums.  Regular physical activity.  Eating a healthy diet.  Avoiding tobacco and drug use.  Limiting alcohol  use.  Practicing safe sex.  Taking low-dose aspirin daily starting at age 15.  Taking vitamin and mineral supplements as recommended by your health care provider. What happens during an annual well check? The services and screenings done by your health care provider during your annual well check will depend on your age, overall health, lifestyle risk factors, and family history of disease. Counseling  Your health care provider may ask you questions about your:  Alcohol use.  Tobacco use.  Drug use.  Emotional well-being.  Home and relationship well-being.  Sexual activity.  Eating habits.  Work and work Statistician.  Method of birth control.  Menstrual cycle.  Pregnancy history. Screening  You may have the following tests or measurements:  Height, weight, and BMI.  Blood pressure.  Lipid and cholesterol levels. These may be checked every 5 years, or more frequently if you are over 109 years old.  Skin check.  Lung cancer screening. You may have this screening every year starting at age 44 if you have a 30-pack-year history of smoking and currently smoke or have quit within the past 15 years.  Fecal occult blood test (FOBT) of the stool. You may have this test every year starting at age 47.  Flexible sigmoidoscopy or colonoscopy. You may have a sigmoidoscopy every 5 years or a colonoscopy every 10 years starting at age 65.  Hepatitis C blood test.  Hepatitis B blood test.  Sexually transmitted disease (STD) testing.  Diabetes screening. This is done by checking your blood sugar (glucose) after you have not eaten for a while (fasting). You may have this done every 1-3 years.  Mammogram. This may be done every 1-2 years. Talk to your health care provider about when you should start having regular mammograms. This may depend on whether you have a family history of breast cancer.  BRCA-related cancer screening. This may be done if you have a family history of  breast, ovarian, tubal, or peritoneal cancers.  Pelvic exam and Pap test. This may be done every 3 years starting at age 67. Starting at age 80, this may be done every 5 years if you have a Pap test in combination with an HPV test.  Bone density scan. This is done to screen for osteoporosis. You may have this scan if you are at high risk for osteoporosis. Discuss your test results, treatment options, and if necessary, the need for more tests with your health care provider. Vaccines  Your health care provider may recommend certain vaccines, such as:  Influenza vaccine. This is recommended every year.  Tetanus, diphtheria, and acellular pertussis (Tdap, Td) vaccine. You may need a Td booster every 10 years.  Zoster vaccine. You may need this after age 19.  Pneumococcal 13-valent conjugate (PCV13) vaccine. You may need this if you have certain conditions and were not previously vaccinated.  Pneumococcal polysaccharide (PPSV23) vaccine. You may need one or two doses if you smoke cigarettes or if you have certain conditions. Talk to your health care provider about which screenings and vaccines you need and how often you need them. This information is not intended to replace advice given to you by your health care provider. Make sure you discuss any questions you have with your health care provider. Document Released: 06/12/2015 Document Revised: 02/03/2016 Document Reviewed: 03/17/2015 Elsevier Interactive Patient Education  2017 [place] Prevention in the Home Falls can cause injuries. They can happen to people of all ages. There are many things you can do to make your home safe and to help prevent falls. What can I do on the outside of my home?  Regularly fix the edges of walkways and driveways and fix any cracks.  Remove anything that might make you trip as you walk through a door, such as a raised step or threshold.  Trim any bushes or trees on the path to your  home.  Use bright outdoor lighting.  Clear any walking paths of anything that might make someone trip, such as rocks or tools.  Regularly check to see if handrails are loose or broken. Make sure that both sides of any steps have handrails.  Any raised decks and porches should have guardrails on the edges.  Have any leaves, snow, or ice cleared regularly.  Use sand or salt on walking paths during winter.  Clean up any spills in your garage right away. This includes oil or grease spills. What can I do in the bathroom?  Use night lights.  Install grab bars by the toilet and in the tub and shower. Do not use towel bars as grab bars.  Use non-skid mats or decals in the tub or shower.  If you need to sit down in the shower, use a plastic, non-slip stool.  Keep the floor dry. Clean up any water that spills on the floor as soon as it happens.  Remove soap buildup in the tub or shower regularly.  Attach bath mats securely with double-sided non-slip rug tape.  Do not have throw rugs and other things on the floor that can make you trip. What can I do in the bedroom?  Use night lights.  Make sure that you have a light by your bed that is easy to reach.  Do not use any sheets or blankets that are too big for your bed. They should not hang down onto the floor.  Have a firm chair that has side arms. You can use this for support while you get dressed.  Do not have throw rugs and other things on the floor that can make you trip. What can I do in the kitchen?  Clean up any spills right away.  Avoid walking on wet floors.  Keep items that you use a lot in easy-to-reach places.  If you need to reach something above you, use a strong step stool that has a grab bar.  Keep electrical cords out of the way.  Do not use floor polish or wax that makes floors slippery. If you must use wax, use non-skid floor wax.  Do not have throw rugs and other things on the floor that can make you  trip. What can I do with my stairs?  Do not leave any items on the stairs.  Make sure that there are handrails on both sides of the stairs and use them. Fix handrails that are broken or loose. Make sure that handrails are as long as the stairways.  Check any carpeting to make sure that it is firmly attached to the stairs. Fix any carpet that is loose or worn.  Avoid having throw rugs at the top or bottom of the stairs. If you do have throw rugs, attach them to the floor with carpet tape.  Make sure that you have a light switch at the top of the stairs and the bottom of the stairs. If you do not have them, ask someone to add them for you. What else can I do to help prevent falls?  Wear shoes that:  Do not have high heels.  Have rubber bottoms.  Are comfortable and fit you well.  Are closed at the toe. Do not wear sandals.  If you use a stepladder:  Make sure that it is fully opened. Do not climb a closed stepladder.  Make sure that both sides of the stepladder are locked into place.  Ask someone to hold it for you, if possible.  Clearly mark and make sure that you can see:  Any grab bars or handrails.  First and last steps.  Where the edge of each step is.  Use tools that help you move around (mobility aids) if they are needed. These include:  Canes.  Walkers.  Scooters.  Crutches.  Turn on the lights when you go into a dark area. Replace any light bulbs as soon as they burn out.  Set up your furniture so you have a clear path. Avoid moving your furniture around.  If any of your floors are uneven, fix them.  If there are any pets around you, be aware of where they are.  Review your medicines with your doctor. Some medicines can make you feel dizzy. This can increase your chance of falling. Ask your doctor what other things that you can do to help prevent falls. This information is not intended to replace advice given to you by your health care provider. Make  sure you discuss any questions you have with your health care provider. Document Released: 03/12/2009 Document Revised: 10/22/2015 Document Reviewed: 06/20/2014 Elsevier Interactive Patient Education  2017 [employer].

## 2017-05-26 ENCOUNTER — Ambulatory Visit (INDEPENDENT_AMBULATORY_CARE_PROVIDER_SITE_OTHER): Payer: Medicare HMO | Admitting: Internal Medicine

## 2017-05-26 ENCOUNTER — Encounter: Payer: Self-pay | Admitting: Internal Medicine

## 2017-05-26 VITALS — BP 148/80 | HR 86 | Ht 68.0 in | Wt 206.0 lb

## 2017-05-26 DIAGNOSIS — E119 Type 2 diabetes mellitus without complications: Secondary | ICD-10-CM | POA: Diagnosis not present

## 2017-05-26 DIAGNOSIS — I10 Essential (primary) hypertension: Secondary | ICD-10-CM

## 2017-05-26 DIAGNOSIS — R197 Diarrhea, unspecified: Secondary | ICD-10-CM | POA: Diagnosis not present

## 2017-05-26 MED ORDER — CHOLESTYRAMINE 4 G PO PACK: 4.0000 g | PACK | Freq: Two times a day (BID) | ORAL | 2 refills | Status: DC

## 2017-05-26 MED ORDER — AMLODIPINE BESYLATE 10 MG PO TABS: 10.0000 mg | ORAL_TABLET | Freq: Every day | ORAL | 1 refills | Status: DC

## 2017-05-26 NOTE — Progress Notes (Signed)
Date:  05/26/2017   Name:  Donna Riley   DOB:  Sep 01, 1960   MRN:  [id number]   Chief Complaint: Hypertension; Diabetes; and Abdominal Pain (Pain started 2 weeks ago. States she feels like her food is not digesting properly. Has diarrhea for all stools. States she has had stones in the past.  )  Diabetes  She presents for her follow-up diabetic visit. She has type 2 diabetes mellitus. Pertinent negatives for hypoglycemia include no dizziness or headaches. Pertinent negatives for diabetes include no chest pain and no fatigue. Current diabetic treatment includes oral agent (monotherapy). She is following a generally healthy diet. There is no compliance with monitoring of blood glucose. An ACE inhibitor/angiotensin II receptor blocker is being taken.  Hypertension  This is a chronic problem. The problem has been waxing and waning since onset. Pertinent negatives include no chest pain, headaches, palpitations or shortness of breath. Past treatments include ACE inhibitors, calcium channel blockers and diuretics. There are no compliance problems.   Diarrhea   This is a new problem. The current episode started 1 to 4 weeks ago. The problem occurs 5 to 10 times per day. The problem has been unchanged. The stool consistency is described as watery (yellow). Associated symptoms include abdominal pain. Pertinent negatives include no chills, fever or headaches. She has tried nothing for the symptoms. Her past medical history is significant for irritable bowel syndrome.  She has taken antibiotics in the past 2 months but no other change in medications.  She has city water.  No family members are sick.  Weight is stable.  She is eating small meals because she feels like her stomach stays full long after eating.  Lab Results  Component Value Date   HGBA1C 6.1 (H) 01/25/2017     Review of Systems  Constitutional: Negative for chills, fatigue, fever and unexpected weight change.  Respiratory: Negative  for chest tightness and shortness of breath.   Cardiovascular: Negative for chest pain and palpitations.  Gastrointestinal: Positive for abdominal pain and diarrhea. Negative for blood in stool, nausea and rectal pain.  Genitourinary: Negative for difficulty urinating.  Neurological: Negative for dizziness and headaches.    Patient Active Problem List   Diagnosis Date Noted  . Mastitis 10/15/2016  . Fever and chills   . Breast pain, left 10/14/2016  . Arthritis   . Elevated LFTs   . Personal history of chemotherapy   . Positive PPD, treated   . Personal history of radiation therapy   . Fatigue 08/01/2016  . Umbilical hernia without obstruction and without gangrene 07/13/2016  . Breast wound, left, sequela 06/23/2016  . Special screening for malignant neoplasms, colon   . Rectal polyp   . Irritable bowel syndrome with both constipation and diarrhea 07/20/2015  . Controlled type 2 diabetes mellitus without complication, without long-term current use of insulin ([place]) 06/22/2015  . Pre-ulcerative calluses 06/22/2015  . Neuropathy 06/22/2015  . Hot flash, menopausal 04/09/2015  . Essential (primary) hypertension 03/25/2015  . 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 Osteopenia 03/12/2015  . Hydradenitis 01/23/2015  . Breast cancer ([place]) 05/30/2014  . 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 Authorizing Provider  acetaminophen (TYLENOL) 325 MG tablet Take 650 mg by mouth daily as needed. 09/08/13  Yes [provider]  amLODipine (NORVASC) 5 MG tablet Take 1 tablet (5 mg total) by mouth daily. 01/25/17  Yes  Glean Hess, MD  aspirin EC 81 MG tablet Take 81 mg by mouth daily.   Yes [provider]  lisinopril (PRINIVIL,ZESTRIL) 40 MG tablet Take 1 tablet (40 mg total) by mouth daily. 10/25/16  Yes Carles Collet M, PA-C  metFORMIN (GLUCOPHAGE) 500 MG tablet Take 1 tablet (500 mg  total) by mouth every morning. 01/25/17  Yes Glean Hess, MD  oxyCODONE (OXY IR/ROXICODONE) 5 MG immediate release tablet Take 1 tablet (5 mg total) by mouth every 6 (six) hours as needed for severe pain. 10/27/16  Yes Clayburn Pert, MD  triamterene-hydrochlorothiazide (MAXZIDE-25) 37.5-25 MG tablet Take 1 tablet by mouth daily. 01/25/17  Yes Glean Hess, MD    No Known Allergies  Past Surgical History:  Procedure Laterality Date  . BREAST BIOPSY Left 07/31/2014   positive. Lumpectomy 08/27/2014 with chemo and rad  . BREAST BIOPSY Left 03/08/2016   path pending  . BREAST LUMPECTOMY WITH NEEDLE LOCALIZATION Left 04/05/2016   Procedure: BREAST LUMPECTOMY WITH NEEDLE LOCALIZATION;  Surgeon: Hubbard Robinson, MD;  Location: [facility];  Service: General;  Laterality: Left;  . BREAST LUMPECTOMY WITH NEEDLE LOCALIZATION AND AXILLARY SENTINEL LYMPH NODE BX Left 08/27/14  . CHOLECYSTECTOMY  09/07/2013  . COLONOSCOPY WITH PROPOFOL N/A 08/28/2015   Procedure: COLONOSCOPY WITH PROPOFOL;  Surgeon: Lucilla Lame, MD;  Location: [place];  Service: Endoscopy;  Laterality: N/A;  Diabetic oral  . ESOPHAGOGASTRODUODENOSCOPY  2014   gastritis  . POLYPECTOMY  08/28/2015   Procedure: POLYPECTOMY INTESTINAL;  Surgeon: Lucilla Lame, MD;  Location: [place];  Service: Endoscopy;;  Rectal polyp  . TUBAL LIGATION Bilateral   . UMBILICAL HERNIA REPAIR N/A 08/09/2016   Procedure: HERNIA REPAIR UMBILICAL ADULT;  Surgeon: Olean Ree, MD;  Location: [facility];  Service: General;  Laterality: N/A;  . VENTRAL HERNIA REPAIR N/A 04/10/2015   Procedure: HERNIA REPAIR VENTRAL ADULT;  Surgeon: Leonie Green, MD;  Location: [facility];  Service: General;  Laterality: N/A;    Social History   Tobacco Use  . Smoking status: Current Every Day Smoker    Packs/day: 0.50    Years: 38.00    Pack years: 19.00    Types: Cigarettes  . Smokeless tobacco: Never Used  . Tobacco comment: Smoke 8  cigerattes a day.   Substance Use Topics  . Alcohol use: Yes    Alcohol/week: 1.2 oz    Types: 2 Cans of beer per week    Comment: Occasionally   . Drug use: No     Medication list has been reviewed and updated.  PHQ 2/9 Scores 05/18/2017 04/18/2016 09/16/2015  PHQ - 2 Score 0 0 0    Physical Exam  Constitutional: She is oriented to person, place, and time. She appears well-developed. No distress.  HENT:  Head: Normocephalic and atraumatic.  Cardiovascular: Normal rate, regular rhythm and normal heart sounds.  Pulmonary/Chest: Effort normal. No respiratory distress. She has no wheezes. She has no rales.  Abdominal: Soft. There is tenderness in the right upper quadrant, right lower quadrant and epigastric area. There is no rigidity, no rebound, no guarding and no CVA tenderness.  Musculoskeletal: Normal range of motion.  Neurological: She is alert and oriented to person, place, and time.  Skin: Skin is warm and dry. No rash noted.  Psychiatric: She has a normal mood and affect. Her behavior is normal. Thought content normal.  Nursing note and vitals reviewed.   BP (!) 148/80   Pulse 86  Ht 5\' 8"  (1.727 m)   Wt 206 lb (93.4 kg)   SpO2 97%   BMI 31.32 kg/m   Assessment and Plan: 1. Controlled type 2 diabetes mellitus without complication, without long-term current use of insulin (HCC) - Comprehensive metabolic panel - Hemoglobin A1c  2. Essential (primary) hypertension Increase dose of norvasc - amLODipine (NORVASC) 10 MG tablet; Take 1 tablet (10 mg total) by mouth daily.  Dispense: 90 tablet; Refill: 1  3. Diarrhea, unspecified type - CBC with Differential/Platelet - Stool C-Diff Toxin Assay - cholestyramine (QUESTRAN) 4 g packet; Take 1 packet (4 g total) by mouth 2 (two) times daily.  Dispense: 60 each; Refill: 2   Meds ordered this encounter  Medications  . cholestyramine (QUESTRAN) 4 g packet    Sig: Take 1 packet (4 g total) by mouth 2 (two) times daily.     Dispense:  60 each    Refill:  2  . amLODipine (NORVASC) 10 MG tablet    Sig: Take 1 tablet (10 mg total) by mouth daily.    Dispense:  90 tablet    Refill:  1    Partially dictated using Editor, commissioning. Any errors are unintentional.  Halina Maidens, MD [facility]  05/26/2017

## 2017-05-27 DIAGNOSIS — Z8542 Personal history of malignant neoplasm of other parts of uterus: Secondary | ICD-10-CM | POA: Diagnosis not present

## 2017-05-27 DIAGNOSIS — R10811 Right upper quadrant abdominal tenderness: Secondary | ICD-10-CM | POA: Diagnosis not present

## 2017-05-27 DIAGNOSIS — I10 Essential (primary) hypertension: Secondary | ICD-10-CM | POA: Diagnosis not present

## 2017-05-27 DIAGNOSIS — Z853 Personal history of malignant neoplasm of breast: Secondary | ICD-10-CM | POA: Diagnosis not present

## 2017-05-27 DIAGNOSIS — R69 Illness, unspecified: Secondary | ICD-10-CM | POA: Diagnosis not present

## 2017-05-27 DIAGNOSIS — K760 Fatty (change of) liver, not elsewhere classified: Secondary | ICD-10-CM | POA: Diagnosis not present

## 2017-05-27 DIAGNOSIS — E119 Type 2 diabetes mellitus without complications: Secondary | ICD-10-CM | POA: Diagnosis not present

## 2017-05-27 DIAGNOSIS — Z9221 Personal history of antineoplastic chemotherapy: Secondary | ICD-10-CM | POA: Diagnosis not present

## 2017-05-27 DIAGNOSIS — R10813 Right lower quadrant abdominal tenderness: Secondary | ICD-10-CM | POA: Diagnosis not present

## 2017-05-27 DIAGNOSIS — Z923 Personal history of irradiation: Secondary | ICD-10-CM | POA: Diagnosis not present

## 2017-05-27 DIAGNOSIS — R1031 Right lower quadrant pain: Secondary | ICD-10-CM | POA: Diagnosis not present

## 2017-05-27 LAB — COMPREHENSIVE METABOLIC PANEL
ALT: 20 IU/L (ref 0–32)
AST: 16 IU/L (ref 0–40)
Albumin/Globulin Ratio: 1.6 (ref 1.2–2.2)
Albumin: 4.4 g/dL (ref 3.5–5.5)
Alkaline Phosphatase: 106 IU/L (ref 39–117)
BUN/Creatinine Ratio: 10 (ref 9–23)
BUN: 8 mg/dL (ref 6–24)
Bilirubin Total: <0.2 mg/dL (ref 0.0–1.2)
CO2: 22 mmol/L (ref 20–29)
Calcium: 9.5 mg/dL (ref 8.7–10.2)
Chloride: 103 mmol/L (ref 96–106)
Creatinine, Ser: 0.77 mg/dL (ref 0.57–1.00)
GFR calc Af Amer: 99 mL/min/{1.73_m2} (ref >59)
GFR calc non Af Amer: 86 mL/min/{1.73_m2} (ref >59)
Globulin, Total: 2.7 g/dL (ref 1.5–4.5)
Glucose: 86 mg/dL (ref 65–99)
Potassium: 4.3 mmol/L (ref 3.5–5.2)
Sodium: 145 mmol/L — ABNORMAL HIGH (ref 134–144)
Total Protein: 7.1 g/dL (ref 6.0–8.5)

## 2017-05-27 LAB — CBC WITH DIFFERENTIAL/PLATELET
Basophils Absolute: 0 10*3/uL (ref 0.0–0.2)
Basos: 0 %
EOS (ABSOLUTE): 0.1 10*3/uL (ref 0.0–0.4)
Eos: 2 %
Hematocrit: 40.7 % (ref 34.0–46.6)
Hemoglobin: 13.6 g/dL (ref 11.1–15.9)
Immature Grans (Abs): 0 10*3/uL (ref 0.0–0.1)
Immature Granulocytes: 0 %
Lymphocytes Absolute: 2.5 10*3/uL (ref 0.7–3.1)
Lymphs: 36 %
MCH: 26.7 pg (ref 26.6–33.0)
MCHC: 33.4 g/dL (ref 31.5–35.7)
MCV: 80 fL (ref 79–97)
Monocytes Absolute: 0.3 10*3/uL (ref 0.1–0.9)
Monocytes: 5 %
Neutrophils Absolute: 3.9 10*3/uL (ref 1.4–7.0)
Neutrophils: 57 %
Platelets: 231 10*3/uL (ref 150–379)
RBC: 5.09 x10E6/uL (ref 3.77–5.28)
RDW: 14.6 % (ref 12.3–15.4)
WBC: 6.8 10*3/uL (ref 3.4–10.8)

## 2017-05-27 LAB — HEMOGLOBIN A1C
Est. average glucose Bld gHb Est-mCnc: 131 mg/dL
Hgb A1c MFr Bld: 6.2 % — ABNORMAL HIGH (ref 4.8–5.6)

## 2017-06-26 ENCOUNTER — Inpatient Hospital Stay: Payer: Medicare HMO | Attending: Hematology and Oncology

## 2017-06-26 ENCOUNTER — Encounter: Payer: Self-pay | Admitting: *Deleted

## 2017-06-26 ENCOUNTER — Inpatient Hospital Stay (HOSPITAL_BASED_OUTPATIENT_CLINIC_OR_DEPARTMENT_OTHER): Payer: Medicare HMO | Admitting: Hematology and Oncology

## 2017-06-26 ENCOUNTER — Encounter: Payer: Self-pay | Admitting: Hematology and Oncology

## 2017-06-26 VITALS — BP 144/78 | HR 77 | Temp 97.5°F | Resp 18 | Wt 208.0 lb

## 2017-06-26 DIAGNOSIS — F17210 Nicotine dependence, cigarettes, uncomplicated: Secondary | ICD-10-CM

## 2017-06-26 DIAGNOSIS — Z923 Personal history of irradiation: Secondary | ICD-10-CM | POA: Diagnosis not present

## 2017-06-26 DIAGNOSIS — C50912 Malignant neoplasm of unspecified site of left female breast: Secondary | ICD-10-CM | POA: Diagnosis not present

## 2017-06-26 DIAGNOSIS — Z9012 Acquired absence of left breast and nipple: Secondary | ICD-10-CM

## 2017-06-26 DIAGNOSIS — Z170 Estrogen receptor positive status [ER+]: Secondary | ICD-10-CM | POA: Insufficient documentation

## 2017-06-26 DIAGNOSIS — I10 Essential (primary) hypertension: Secondary | ICD-10-CM | POA: Insufficient documentation

## 2017-06-26 DIAGNOSIS — Z79811 Long term (current) use of aromatase inhibitors: Secondary | ICD-10-CM | POA: Insufficient documentation

## 2017-06-26 DIAGNOSIS — Z9221 Personal history of antineoplastic chemotherapy: Secondary | ICD-10-CM | POA: Insufficient documentation

## 2017-06-26 DIAGNOSIS — M8580 Other specified disorders of bone density and structure, unspecified site: Secondary | ICD-10-CM

## 2017-06-26 DIAGNOSIS — Z7982 Long term (current) use of aspirin: Secondary | ICD-10-CM | POA: Insufficient documentation

## 2017-06-26 DIAGNOSIS — E119 Type 2 diabetes mellitus without complications: Secondary | ICD-10-CM | POA: Insufficient documentation

## 2017-06-26 DIAGNOSIS — Z7984 Long term (current) use of oral hypoglycemic drugs: Secondary | ICD-10-CM | POA: Diagnosis not present

## 2017-06-26 DIAGNOSIS — Z79899 Other long term (current) drug therapy: Secondary | ICD-10-CM | POA: Insufficient documentation

## 2017-06-26 DIAGNOSIS — Z853 Personal history of malignant neoplasm of breast: Secondary | ICD-10-CM

## 2017-06-26 DIAGNOSIS — R69 Illness, unspecified: Secondary | ICD-10-CM | POA: Diagnosis not present

## 2017-06-26 LAB — CBC WITH DIFFERENTIAL/PLATELET
Basophils Absolute: 0.1 10*3/uL (ref 0–0.1)
Basophils Relative: 1 %
Eosinophils Absolute: 0.1 10*3/uL (ref 0–0.7)
Eosinophils Relative: 2 %
HCT: 40.6 % (ref 35.0–47.0)
Hemoglobin: 13.3 g/dL (ref 12.0–16.0)
Lymphocytes Relative: 42 %
Lymphs Abs: 2.6 10*3/uL (ref 1.0–3.6)
MCH: 26.9 pg (ref 26.0–34.0)
MCHC: 32.8 g/dL (ref 32.0–36.0)
MCV: 82.3 fL (ref 80.0–100.0)
Monocytes Absolute: 0.4 10*3/uL (ref 0.2–0.9)
Monocytes Relative: 7 %
Neutro Abs: 2.9 10*3/uL (ref 1.4–6.5)
Neutrophils Relative %: 48 %
Platelets: 230 10*3/uL (ref 150–440)
RBC: 4.93 MIL/uL (ref 3.80–5.20)
RDW: 14.6 % — ABNORMAL HIGH (ref 11.5–14.5)
WBC: 6.1 10*3/uL (ref 3.6–11.0)

## 2017-06-26 LAB — COMPREHENSIVE METABOLIC PANEL
ALT: 20 U/L (ref 14–54)
AST: 23 U/L (ref 15–41)
Albumin: 4.1 g/dL (ref 3.5–5.0)
Alkaline Phosphatase: 97 U/L (ref 38–126)
Anion gap: 10 (ref 5–15)
BUN: 11 mg/dL (ref 6–20)
CO2: 24 mmol/L (ref 22–32)
Calcium: 9.1 mg/dL (ref 8.9–10.3)
Chloride: 105 mmol/L (ref 101–111)
Creatinine, Ser: 0.71 mg/dL (ref 0.44–1.00)
GFR calc Af Amer: >60 mL/min (ref >60)
GFR calc non Af Amer: >60 mL/min (ref >60)
Glucose, Bld: 92 mg/dL (ref 65–99)
Potassium: 3.9 mmol/L (ref 3.5–5.1)
Sodium: 139 mmol/L (ref 135–145)
Total Bilirubin: 0.4 mg/dL (ref 0.3–1.2)
Total Protein: 7.5 g/dL (ref 6.5–8.1)

## 2017-06-26 NOTE — Progress Notes (Signed)
Pt in for follow up.  Reports fatigue has increased.  May have possible kidney stone, has been seen for possible kidney stone at [facility].

## 2017-06-26 NOTE — Progress Notes (Signed)
[facility] day:  06/26/17   Chief Complaint: Donna Riley is a 57 y.o. female with stage IIA left breast cancer who is seen for 4 month assessment.  HPI: The patient was last seen in the medical oncology clinic on 02/23/2017.  At that time, she was doing well on Arimidex.  She had RIGHT sided abdominal discomfort.  Exam revealed stable post operative changes to the LEFT breast.  She was referred to plastic surgery for consultation regarding LEFT breast "leaking" and need for cosmetic reconstruction.   During the interim, she has been "alright".  She denies any breast concerns.  She notes being tired and forgetful.  She notes blood in her urine.  She is undergoing an evaluation.  CT stone study was negative.   Past Medical History:  Diagnosis Date  . Arthritis    shoulders and neck  . Breast cancer ([place]) 2016    Left breast with chemo + rad tx's with lumpectomy.  . Diabetes mellitus without complication ([place]) 11/8936  . Elevated LFTs   . Hypertension   . Personal history of chemotherapy   . Personal history of radiation therapy   . Positive PPD, treated   . Shortness of breath dyspnea    with exertion    Past Surgical History:  Procedure Laterality Date  . BREAST BIOPSY Left 07/31/2014   positive. Lumpectomy 08/27/2014 with chemo and rad  . BREAST BIOPSY Left 03/08/2016   path pending  . BREAST LUMPECTOMY WITH NEEDLE LOCALIZATION Left 04/05/2016   Procedure: BREAST LUMPECTOMY WITH NEEDLE LOCALIZATION;  Surgeon: Hubbard Robinson, MD;  Location: [facility];  Service: General;  Laterality: Left;  . BREAST LUMPECTOMY WITH NEEDLE LOCALIZATION AND AXILLARY SENTINEL LYMPH NODE BX Left 08/27/14  . CHOLECYSTECTOMY  09/07/2013  . COLONOSCOPY WITH PROPOFOL N/A 08/28/2015   Procedure: COLONOSCOPY WITH PROPOFOL;  Surgeon: Lucilla Lame, MD;  Location: [place];  Service: Endoscopy;  Laterality: N/A;  Diabetic oral  .  ESOPHAGOGASTRODUODENOSCOPY  2014   gastritis  . POLYPECTOMY  08/28/2015   Procedure: POLYPECTOMY INTESTINAL;  Surgeon: Lucilla Lame, MD;  Location: [place];  Service: Endoscopy;;  Rectal polyp  . TUBAL LIGATION Bilateral   . UMBILICAL HERNIA REPAIR N/A 08/09/2016   Procedure: HERNIA REPAIR UMBILICAL ADULT;  Surgeon: Olean Ree, MD;  Location: [facility];  Service: General;  Laterality: N/A;  . VENTRAL HERNIA REPAIR N/A 04/10/2015   Procedure: HERNIA REPAIR VENTRAL ADULT;  Surgeon: Leonie Green, MD;  Location: [facility];  Service: General;  Laterality: N/A;    Family History  Problem Relation Age of Onset  . Cancer Mother        Pancreatic  . Cancer Father        Throat  . Healthy Sister   . HIV Brother   . Cancer Maternal Grandmother        Breast  . Breast cancer Maternal Grandmother 75    Social History:  reports that she has been smoking cigarettes.  She has a 19.00 pack-year smoking history. she has never used smokeless tobacco. She reports that she drinks about 1.2 oz of alcohol per week. She reports that she does not use drugs.  She lives in [place].  The patient is alone today.  Allergies: No Known Allergies  Current Medications: Current Outpatient Medications  Medication Sig Dispense Refill  . acetaminophen (TYLENOL) 325 MG tablet Take 650 mg by mouth daily as needed.    Marland Kitchen  amLODipine (NORVASC) 10 MG tablet Take 1 tablet (10 mg total) by mouth daily. 90 tablet 1  . aspirin EC 81 MG tablet Take 81 mg by mouth daily.    Marland Kitchen lisinopril (PRINIVIL,ZESTRIL) 40 MG tablet Take 1 tablet (40 mg total) by mouth daily. 90 tablet 3  . metFORMIN (GLUCOPHAGE) 500 MG tablet Take 1 tablet (500 mg total) by mouth every morning. 90 tablet 3  . cholestyramine (QUESTRAN) 4 g packet Take 1 packet (4 g total) by mouth 2 (two) times daily. (Patient not taking: Reported on 06/26/2017) 60 each 2  . triamterene-hydrochlorothiazide (MAXZIDE-25) 37.5-25 MG tablet Take 1 tablet by mouth  daily. (Patient not taking: Reported on 06/26/2017) 90 tablet 3   No current facility-administered medications for this visit.     Review of Systems:  GENERAL: Feels "alright".  Tired.  No fevers or sweats.  Weight stable. PERFORMANCE STATUS (ECOG): 1 HEENT: No visual changes, runny nose, sore throat, mouth sores or tenderness. Lungs: No shortness of breath or cough. No hemoptysis. Cardiac: No chest pain, palpitations, orthopnea, or PND. Breast: Denies any breast masses or concerns.  No leakage. GI: No nausea, vomiting, diarrhea, constipation, melena or hematochezia.  s/p colonoscsopy 08/28/2015. GU: Blood in urine (see HPI).  No urgency, frequency, or dysuria. Musculoskeletal: No back pain. No joint pain. No muscle tenderness. Extremities: No pain or swelling. Skin: No skin changes, rashes or ulcers. Neuro:Forgetful.  No headache, numbness or weakness, balance or coordination issues. Endocrine: Hot flashes.  No diabetes, thyroid issues or night sweats. Psych: Trouble sleeping at night.  No mood changes, depression or anxiety. Pain: No pain.. Review of systems: All other systems reviewed and found to be negative.  Physical Exam: Blood pressure (!) 144/78, pulse 77, temperature (!) 97.5 F (36.4 C), temperature source Tympanic, resp. rate 18, weight 208 lb (94.3 kg). GENERAL: Well developed, well nourished, woman sitting comfortably in the exam room in no acute distress. MENTAL STATUS: Alert and oriented to person, place and time. HEAD: Long brown braids. Normocephalic, atraumatic, face symmetric, no Cushingoid features. EYES: Brown eyes. Pupils equal round and reactive to light and accomodation. No conjunctivitis or scleral icterus. ENT: Oropharynx clear without lesion. Tongue normal. Mucous membranes moist.  RESPIRATORY: Clear to auscultation without rales, wheezes or rhonchi. CARDIOVASCULAR: Regular rate and rhythm without murmur, rub or  gallop. BREASTS:  Right breast with fibrocystic changes superiorly.  No masses, skin changes or nipple discharge.  Left breast with moderate post surgical and post radiation changes with edema.  Arched incision.  Tiny pinhole along incision well healed.  Nipple areola complex pulling medially.  ABDOMEN: Ventral hernia.  Soft, non-tender with active bowel sounds and no hepatosplenomegaly. No masses. SKIN:  No rashes, ulcers or lesions. EXTREMITIES: No edema, no skin discoloration or tenderness. No palpable cords. LYMPH NODES: No palpable cervical, supraclavicular, axillary or inguinal adenopathy  NEUROLOGICAL: Unremarkable.  PSYCH: Appropriate.   Appointment on 06/26/2017  Component Date Value Ref Range Status  . CA 27.29 06/26/2017 14.1  0.0 - 38.6 U/mL Final   Comment: (NOTE) Siemens Centaur Immunochemiluminometric Methodology [facility]) Values obtained with different assay methods or kits cannot be used interchangeably. Results cannot be interpreted as absolute evidence of the presence or absence of malignant disease. Performed At: [facility] [place], [place] [id number] Rush Farmer MD UY:[id number] Performed at [facility], [address] [place], [place] [address]   . Sodium 06/26/2017 139  135 - 145 mmol/L Final  . Potassium 06/26/2017  3.9  3.5 - 5.1 mmol/L Final  . Chloride 06/26/2017 105  101 - 111 mmol/L Final  . CO2 06/26/2017 24  22 - 32 mmol/L Final  . Glucose, Bld 06/26/2017 92  65 - 99 mg/dL Final  . BUN 06/26/2017 11  6 - 20 mg/dL Final  . Creatinine, Ser 06/26/2017 0.71  0.44 - 1.00 mg/dL Final  . Calcium 06/26/2017 9.1  8.9 - 10.3 mg/dL Final  . Total Protein 06/26/2017 7.5  6.5 - 8.1 g/dL Final  . Albumin 06/26/2017 4.1  3.5 - 5.0 g/dL Final  . AST 06/26/2017 23  15 - 41 U/L Final  . ALT 06/26/2017 20  14 - 54 U/L Final  . Alkaline Phosphatase 06/26/2017 97  38 - 126 U/L Final  . Total Bilirubin 06/26/2017 0.4  0.3 - 1.2 mg/dL  Final  . GFR calc non Af Amer 06/26/2017 >60  >60 mL/min Final  . GFR calc Af Amer 06/26/2017 >60  >60 mL/min Final   Comment: (NOTE) The eGFR has been calculated using the CKD EPI equation. This calculation has not been validated in all clinical situations. eGFR's persistently <60 mL/min signify possible Chronic Kidney Disease.   Georgiann Hahn gap 06/26/2017 10  5 - 15 Final   Performed at [facility], [address] [place], [place] [address]  . WBC 06/26/2017 6.1  3.6 - 11.0 K/uL Final  . RBC 06/26/2017 4.93  3.80 - 5.20 MIL/uL Final  . Hemoglobin 06/26/2017 13.3  12.0 - 16.0 g/dL Final  . HCT 06/26/2017 40.6  35.0 - 47.0 % Final  . MCV 06/26/2017 82.3  80.0 - 100.0 fL Final  . MCH 06/26/2017 26.9  26.0 - 34.0 pg Final  . MCHC 06/26/2017 32.8  32.0 - 36.0 g/dL Final  . RDW 06/26/2017 14.6* 11.5 - 14.5 % Final  . Platelets 06/26/2017 230  150 - 440 K/uL Final  . Neutrophils Relative % 06/26/2017 48  % Final  . Neutro Abs 06/26/2017 2.9  1.4 - 6.5 K/uL Final  . Lymphocytes Relative 06/26/2017 42  % Final  . Lymphs Abs 06/26/2017 2.6  1.0 - 3.6 K/uL Final  . Monocytes Relative 06/26/2017 7  % Final  . Monocytes Absolute 06/26/2017 0.4  0.2 - 0.9 K/uL Final  . Eosinophils Relative 06/26/2017 2  % Final  . Eosinophils Absolute 06/26/2017 0.1  0 - 0.7 K/uL Final  . Basophils Relative 06/26/2017 1  % Final  . Basophils Absolute 06/26/2017 0.1  0 - 0.1 K/uL Final   Performed at [facility], [address], [place], [place] [address]    Assessment:  Donna Riley is a 57 y.o. African American female with stage IIA left breast cancer status post partial mastectomy and sentinel lymph node biopsy on 08/27/2014. 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 There was lymphovascular invasion. One of 2 sentinel lymph nodes were positive (focus of 2.8 mm). Tumor was > 90% ER positive, > 90% PR positive, and  Her2/neu negative. Pathologic stage was [id number].  Bone scan on 09/16/2014 revealed abnormal focal uptake at the level of right L3 pedicle and L5 vertebral body. Lumbar spine MRI on 09/27/2014 revealed no evidence of metastatic disease with lower lumbar facet arthritis. Abdominal and pelvic CT scan on 09/24/2014 revealed hepatomegaly and no evidence of metastatic disease.   Bone density study on 09/15/2014 revealed osteopenia with a T-score of -2.1 at L1-L4.  She is on calcium and vitamin D.  She received 4 cycles of Taxotere and Cytoxan (09/29/2014 - 12/09/2014) with Neulasta support. She received 50.4 Gy to the left breast from 01/12/2015 until 02/23/2015.  She was started on Femara on 03/12/2015, but switched to Arimidex on 03/30/2015 secondary to diffuse joint aches.    CA27.29 has been followed: 16.9 on 09/09/2014, 12.1 on 06/02/2015, 15.6 on 08/07/2015, 17.4 on 11/23/2015, 13.6 on 02/22/2016, 18.9 on 06/20/2016, 18.4 on 10/21/2016, 15 on 02/23/2017, and 14.1 on 06/26/2017.  Bilateral diagnostic mammogram on 07/30/2015 revealed no evidence of malignancy.  Left sided mammogram and ultrasound on 02/29/2016 revealed a 1.5 x 0.6 x 0.7 cm intraductal mass.  Bilateral mammogram on 09/12/2016 revealed no focal fluid collection at the surgical site to account for the persistent drainage. There were no suspicious mammographic or targeted sonographic abnormalities at the lumpectomy site. There was no evidence of malignancy in the bilateral breasts.  She had chronic left nipple discharge.  Left breast lumpectomy at the 11 o'clock position on 04/05/2016 revealed an intraductal papilloma with sclerosis and calcifications. There was fat necrosis with fibrosis and calcifications. Pathology was negative for atypia and malignancy  Colonoscopy on 08/28/2015 revealed one 4 mm polyp in the rectum (tubular adenoma).  Symptomatically, she is doing well on Arimidex.  She notes being forgetful.  She notes  hematuria.  Exam is stable.   Plan: 1.  Labs today:  CBC with diff, CMP, CA27.29. 2.  Continue Arimidex. 3.  Follow-up with PCP regarding evaluation of hematuria. 4.  Bilateral mammogram on 09/12/2017. 5.  Consider REMEMBER study. 6.  RTC in 6 months for MD assessment and labs (CBC with diff, CMP, CA27.29).   Lequita Asal, MD  06/26/2017, 4:35 PM

## 2017-06-27 LAB — CA 27.29 (SERIAL MONITOR): CA 27.29: 14.1 U/mL (ref 0.0–38.6)

## 2017-07-02 ENCOUNTER — Encounter: Payer: Self-pay | Admitting: Hematology and Oncology

## 2017-08-24 ENCOUNTER — Telehealth: Payer: Self-pay

## 2017-08-24 NOTE — Telephone Encounter (Signed)
Patient called stating she has been waking up the last few days feeling "swimmy headed". States she feels dizzy when moving her head and standing up. Took bp this morning and it was 178/84.   Spoke with Dr Army Melia. Called patient and informed her to take Dramamine over the counter for the next few days as needed for dizziness. If it does not get any better then call next week for appt to be seen. She verbalized understanding.

## 2017-09-14 ENCOUNTER — Ambulatory Visit
Admission: RE | Admit: 2017-09-14 | Discharge: 2017-09-14 | Disposition: A | Discharge status: Home / Self Care | Payer: Medicare HMO | Source: Ambulatory Visit | Attending: Hematology and Oncology | Admitting: Hematology and Oncology

## 2017-09-14 DIAGNOSIS — R928 Other abnormal and inconclusive findings on diagnostic imaging of breast: Secondary | ICD-10-CM | POA: Diagnosis not present

## 2017-09-14 DIAGNOSIS — C50912 Malignant neoplasm of unspecified site of left female breast: Secondary | ICD-10-CM | POA: Diagnosis present

## 2017-09-14 DIAGNOSIS — Z853 Personal history of malignant neoplasm of breast: Secondary | ICD-10-CM | POA: Insufficient documentation

## 2017-09-14 DIAGNOSIS — Z170 Estrogen receptor positive status [ER+]: Secondary | ICD-10-CM

## 2017-09-14 DIAGNOSIS — Z08 Encounter for follow-up examination after completed treatment for malignant neoplasm: Secondary | ICD-10-CM | POA: Insufficient documentation

## 2017-11-03 ENCOUNTER — Encounter: Payer: Self-pay | Admitting: Internal Medicine

## 2017-11-03 ENCOUNTER — Ambulatory Visit (INDEPENDENT_AMBULATORY_CARE_PROVIDER_SITE_OTHER): Payer: Medicare HMO | Admitting: Internal Medicine

## 2017-11-03 VITALS — BP 138/80 | HR 80 | Ht 67.0 in | Wt 213.0 lb

## 2017-11-03 DIAGNOSIS — N760 Acute vaginitis: Secondary | ICD-10-CM | POA: Diagnosis not present

## 2017-11-03 LAB — POCT WET PREP WITH KOH
KOH Prep POC: NEGATIVE
RBC Wet Prep HPF POC: 2
Trichomonas, UA: NEGATIVE
WBC Wet Prep HPF POC: 5
Yeast Wet Prep HPF POC: 0

## 2017-11-03 MED ORDER — METRONIDAZOLE 500 MG PO TABS: 500.0000 mg | ORAL_TABLET | Freq: Two times a day (BID) | ORAL | 0 refills | Status: AC

## 2017-11-03 NOTE — Progress Notes (Signed)
Date:  11/03/2017   Name:  Donna Riley   DOB:  25-Jun-1960   MRN:  [id number]   Chief Complaint: Vaginal Pain (burning, wipe is tender/ no sexual activity, been swimming, clear discharge) Vaginal Pain  The patient's primary symptoms include vaginal discharge. This is a new problem. The current episode started in the past 7 days. The problem occurs constantly. The problem has been unchanged. The pain is mild. She is not pregnant. Pertinent negatives include no chills, dysuria, fever or hematuria. The vaginal discharge was white and thin. There has been no bleeding. She is not sexually active.      Review of Systems  Constitutional: Negative for chills, fatigue and fever.  Respiratory: Negative for chest tightness and shortness of breath.   Cardiovascular: Negative for chest pain and palpitations.  Genitourinary: Positive for vaginal discharge and vaginal pain. Negative for dysuria and hematuria.    Patient Active Problem List   Diagnosis Date Noted  . Mastitis 10/15/2016  . Fever and chills   . Breast pain, left 10/14/2016  . Arthritis   . Elevated LFTs   . Personal history of chemotherapy   . Positive PPD, treated   . Personal history of radiation therapy   . Fatigue 08/01/2016  . Umbilical hernia without obstruction and without gangrene 07/13/2016  . Breast wound, left, sequela 06/23/2016  . Special screening for malignant neoplasms, colon   . Rectal polyp   . Irritable bowel syndrome with both constipation and diarrhea 07/20/2015  . Controlled type 2 diabetes mellitus without complication, without long-term current use of insulin ([place]) 06/22/2015  . Pre-ulcerative calluses 06/22/2015  . Neuropathy 06/22/2015  . Hot flash, menopausal 04/09/2015  . Essential (primary) hypertension 03/25/2015  . 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 Osteopenia 03/12/2015  . Hydradenitis 01/23/2015  . Breast cancer ([place]) 05/30/2014  . 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 Authorizing Provider  acetaminophen (TYLENOL) 325 MG tablet Take 650 mg by mouth daily as needed. 09/08/13   [provider]  amLODipine (NORVASC) 10 MG tablet Take 1 tablet (10 mg total) by mouth daily. 05/26/17   Glean Hess, MD  aspirin EC 81 MG tablet Take 81 mg by mouth daily.    [provider]  cholestyramine (QUESTRAN) 4 g packet Take 1 packet (4 g total) by mouth 2 (two) times daily. Patient not taking: Reported on 06/26/2017 05/26/17   Glean Hess, MD  lisinopril (PRINIVIL,ZESTRIL) 40 MG tablet Take 1 tablet (40 mg total) by mouth daily. 10/25/16   Trinna Post, PA-C  metFORMIN (GLUCOPHAGE) 500 MG tablet Take 1 tablet (500 mg total) by mouth every morning. 01/25/17   Glean Hess, MD  triamterene-hydrochlorothiazide (MAXZIDE-25) 37.5-25 MG tablet Take 1 tablet by mouth daily. Patient not taking: Reported on 06/26/2017 01/25/17   Glean Hess, MD    No Known Allergies  Past Surgical History:  Procedure Laterality Date  . BREAST BIOPSY Left 07/31/2014   positive. Lumpectomy 08/27/2014 with chemo and rad  . BREAST BIOPSY Left 03/08/2016   INTRADUCTAL PAPILLOMA WITH SCLEROSIS AND CALCIFICATIONS  . BREAST BIOPSY Left 03/08/2016   FAT NECROSIS WITH FIBROSIS  . BREAST LUMPECTOMY WITH NEEDLE LOCALIZATION Left 04/05/2016   Procedure: BREAST LUMPECTOMY WITH NEEDLE LOCALIZATION;  Surgeon: Hubbard Robinson, MD;  Location: [facility];  Service: General;  Laterality: Left;  . BREAST LUMPECTOMY WITH NEEDLE  LOCALIZATION AND AXILLARY SENTINEL LYMPH NODE BX Left 08/27/14  . CHOLECYSTECTOMY  09/07/2013  . COLONOSCOPY WITH PROPOFOL N/A 08/28/2015   Procedure: COLONOSCOPY WITH PROPOFOL;  Surgeon: Lucilla Lame, MD;  Location: [place];  Service: Endoscopy;  Laterality: N/A;  Diabetic oral  . ESOPHAGOGASTRODUODENOSCOPY  2014   gastritis  . POLYPECTOMY  08/28/2015    Procedure: POLYPECTOMY INTESTINAL;  Surgeon: Lucilla Lame, MD;  Location: [place];  Service: Endoscopy;;  Rectal polyp  . TUBAL LIGATION Bilateral   . UMBILICAL HERNIA REPAIR N/A 08/09/2016   Procedure: HERNIA REPAIR UMBILICAL ADULT;  Surgeon: Olean Ree, MD;  Location: [facility];  Service: General;  Laterality: N/A;  . VENTRAL HERNIA REPAIR N/A 04/10/2015   Procedure: HERNIA REPAIR VENTRAL ADULT;  Surgeon: Leonie Green, MD;  Location: [facility];  Service: General;  Laterality: N/A;    Social History   Tobacco Use  . Smoking status: Current Every Day Smoker    Packs/day: 0.50    Years: 38.00    Pack years: 19.00    Types: Cigarettes  . Smokeless tobacco: Never Used  . Tobacco comment: Smoke 8 cigerattes a day.   Substance Use Topics  . Alcohol use: Yes    Alcohol/week: 1.2 oz    Types: 2 Cans of beer per week    Comment: Occasionally   . Drug use: No     Medication list has been reviewed and updated.  Current Meds  Medication Sig  . acetaminophen (TYLENOL) 325 MG tablet Take 650 mg by mouth daily as needed.  Marland Kitchen amLODipine (NORVASC) 10 MG tablet Take 1 tablet (10 mg total) by mouth daily.  Marland Kitchen aspirin EC 81 MG tablet Take 81 mg by mouth daily.  Marland Kitchen lisinopril (PRINIVIL,ZESTRIL) 40 MG tablet Take 1 tablet (40 mg total) by mouth daily.  . metFORMIN (GLUCOPHAGE) 500 MG tablet Take 1 tablet (500 mg total) by mouth every morning.  . triamterene-hydrochlorothiazide (MAXZIDE-25) 37.5-25 MG tablet Take 1 tablet by mouth daily.    PHQ 2/9 Scores 11/03/2017 05/18/2017 04/18/2016 09/16/2015  PHQ - 2 Score 0 0 0 0  PHQ- 9 Score 3 - - -    Physical Exam  Constitutional: She is oriented to person, place, and time. She appears well-developed. No distress.  HENT:  Head: Normocephalic and atraumatic.  Neck: Neck supple.  Cardiovascular: Normal rate, regular rhythm and normal heart sounds.  Pulmonary/Chest: Effort normal and breath sounds normal. No respiratory distress.   Abdominal: Soft. Bowel sounds are normal. There is tenderness in the periumbilical area. There is no rigidity and no guarding.  Musculoskeletal: Normal range of motion.  Neurological: She is alert and oriented to person, place, and time.  Skin: Skin is warm and dry. No rash noted.  Psychiatric: She has a normal mood and affect. Her behavior is normal. Thought content normal.  Nursing note and vitals reviewed.   BP 138/80   Pulse 80   Ht 5\' 7"  (1.702 m)   Wt 213 lb (96.6 kg)   BMI 33.36 kg/m   Assessment and Plan: 1. Acute vaginitis Use desitin to protect sensitive tissues - POCT Wet Prep with KOH - metroNIDAZOLE (FLAGYL) 500 MG tablet; Take 1 tablet (500 mg total) by mouth 2 (two) times daily for 7 days.  Dispense: 14 tablet; Refill: 0   Meds ordered this encounter  Medications  . metroNIDAZOLE (FLAGYL) 500 MG tablet    Sig: Take 1 tablet (500 mg total) by mouth 2 (two) times daily for  7 days.    Dispense:  14 tablet    Refill:  0    Partially dictated using Editor, commissioning. Any errors are unintentional.  Halina Maidens, MD [facility]  11/03/2017  There are no diagnoses linked to this encounter.

## 2017-11-03 NOTE — Patient Instructions (Signed)
Diaper rash ointment as needed until area is healed

## 2017-11-08 ENCOUNTER — Ambulatory Visit
Admission: RE | Admit: 2017-11-08 | Discharge: 2017-11-08 | Disposition: A | Discharge status: Home / Self Care | Payer: Medicare HMO | Source: Ambulatory Visit | Attending: Radiation Oncology | Admitting: Radiation Oncology

## 2017-11-08 ENCOUNTER — Other Ambulatory Visit: Payer: Self-pay

## 2017-11-08 ENCOUNTER — Encounter: Payer: Self-pay | Admitting: Radiation Oncology

## 2017-11-08 VITALS — BP 154/84 | HR 77 | Temp 95.9°F | Resp 18 | Wt 215.8 lb

## 2017-11-08 DIAGNOSIS — Z923 Personal history of irradiation: Secondary | ICD-10-CM | POA: Diagnosis not present

## 2017-11-08 DIAGNOSIS — Z79811 Long term (current) use of aromatase inhibitors: Secondary | ICD-10-CM | POA: Insufficient documentation

## 2017-11-08 DIAGNOSIS — Z170 Estrogen receptor positive status [ER+]: Secondary | ICD-10-CM | POA: Insufficient documentation

## 2017-11-08 DIAGNOSIS — C50912 Malignant neoplasm of unspecified site of left female breast: Secondary | ICD-10-CM

## 2017-11-08 DIAGNOSIS — C50212 Malignant neoplasm of upper-inner quadrant of left female breast: Secondary | ICD-10-CM | POA: Diagnosis not present

## 2017-11-08 NOTE — Progress Notes (Signed)
Radiation Oncology Follow up Note  Name: Donna Riley   Date:   11/08/2017 MRN:  [id number] DOB: [birth year]    This 57 y.o. female presents to the clinic today for 2.5 year follow-up status post whole breast radiation to her left breast for stage IIa invasive mammary carcinoma.  REFERRING PROVIDER: Glean Hess, MD  HPI: patient is a 57 year old female now out 2.5 years having completed whole breast radiation to her left breast for ER/PR positive HER-2/neu negative invasive mammary carcinoma. She has had in the past reexcision showing intraductal papilloma with sclerosis and calcifications no evidence of malignancy. She's also had retraction of her scar causing some tenderness based on the surgical positioning of her incision. She still complains of left breast tenderness..last mammogram was back in April 2019have reviewed was BI-RADS 2 benign.she is currently on arimadex tolerate that well without side effect.  COMPLICATIONS OF TREATMENT: none  FOLLOW UP COMPLIANCE: keeps appointments   PHYSICAL EXAM:  BP (!) 154/84   Pulse 77   Temp (!) 95.9 F (35.5 C)   Resp 18   Wt 215 lb 13.3 oz (97.9 kg)   BMI 33.80 kg/m  Left breast shows retraction of the scar as described above. No dominant mass or nodularity is noted in either breast in 2 positions examined. No supraclavicular or axillary adenopathy is identified. Well-developed well-nourished patient in NAD. HEENT reveals PERLA, EOMI, discs not visualized.  Oral cavity is clear. No oral mucosal lesions are identified. Neck is clear without evidence of cervical or supraclavicular adenopathy. Lungs are clear to A&P. Cardiac examination is essentially unremarkable with regular rate and rhythm without murmur rub or thrill. Abdomen is benign with no organomegaly or masses noted. Motor sensory and DTR levels are equal and symmetric in the upper and lower extremities. Cranial nerves II through XII are grossly intact. Proprioception is intact.  No peripheral adenopathy or edema is identified. No motor or sensory levels are noted. Crude visual fields are within normal range.  RADIOLOGY RESULTS: mammograms are reviewed and compatible with the above-stated findings  PLAN: the present time she is doing well with no evidence of disease. I'm please were overall progress. I've asked to see her back in 1 year for follow-up. She or he has follow-up mammograms ordered. Patient continues on arimadex without side effect. Patient is to call with any concerns.  I would like to take this opportunity to thank you for allowing me to participate in the care of your patient.[name], MD

## 2017-11-15 ENCOUNTER — Other Ambulatory Visit: Payer: Self-pay | Admitting: *Deleted

## 2017-11-15 ENCOUNTER — Other Ambulatory Visit: Payer: Self-pay

## 2017-11-15 DIAGNOSIS — I10 Essential (primary) hypertension: Secondary | ICD-10-CM

## 2017-11-15 DIAGNOSIS — E119 Type 2 diabetes mellitus without complications: Secondary | ICD-10-CM

## 2017-11-15 MED ORDER — ANASTROZOLE 1 MG PO TABS: 1.0000 mg | ORAL_TABLET | Freq: Every day | ORAL | 0 refills | Status: DC

## 2017-11-15 MED ORDER — LISINOPRIL 40 MG PO TABS: 40.0000 mg | ORAL_TABLET | Freq: Every day | ORAL | 0 refills | Status: DC

## 2017-11-15 MED ORDER — AMLODIPINE BESYLATE 10 MG PO TABS: 10.0000 mg | ORAL_TABLET | Freq: Every day | ORAL | 0 refills | Status: DC

## 2017-11-15 MED ORDER — METFORMIN HCL 500 MG PO TABS: 500.0000 mg | ORAL_TABLET | Freq: Every morning | ORAL | 0 refills | Status: DC

## 2017-11-15 NOTE — Telephone Encounter (Signed)
Rx request from [facility] 11/03/2017 Last CPE 05/26/2018 No New OV

## 2017-11-22 ENCOUNTER — Other Ambulatory Visit: Payer: Self-pay | Admitting: Internal Medicine

## 2017-11-22 MED ORDER — TRUE METRIX LEVEL 1 LOW VI SOLN: 1.0000 | Freq: Every day | 3 refills | Status: AC | PRN

## 2017-11-22 MED ORDER — BD SWAB SINGLE USE REGULAR PADS: 1.0000 | MEDICATED_PAD | Freq: Two times a day (BID) | 3 refills | Status: DC

## 2017-11-23 ENCOUNTER — Other Ambulatory Visit: Payer: Self-pay | Admitting: *Deleted

## 2017-11-23 ENCOUNTER — Other Ambulatory Visit: Payer: Self-pay | Admitting: Internal Medicine

## 2017-11-23 DIAGNOSIS — I10 Essential (primary) hypertension: Secondary | ICD-10-CM

## 2017-11-23 DIAGNOSIS — E119 Type 2 diabetes mellitus without complications: Secondary | ICD-10-CM

## 2017-11-23 MED ORDER — AMLODIPINE BESYLATE 10 MG PO TABS: 10.0000 mg | ORAL_TABLET | Freq: Every day | ORAL | 0 refills | Status: DC

## 2017-11-23 MED ORDER — METFORMIN HCL 500 MG PO TABS: 500.0000 mg | ORAL_TABLET | Freq: Every morning | ORAL | 0 refills | Status: DC

## 2017-11-23 MED ORDER — LISINOPRIL 40 MG PO TABS: 40.0000 mg | ORAL_TABLET | Freq: Every day | ORAL | 0 refills | Status: DC

## 2017-11-23 NOTE — Telephone Encounter (Signed)
Too early for refill  

## 2017-11-28 DIAGNOSIS — E119 Type 2 diabetes mellitus without complications: Secondary | ICD-10-CM | POA: Diagnosis not present

## 2017-11-28 DIAGNOSIS — M79671 Pain in right foot: Secondary | ICD-10-CM | POA: Diagnosis not present

## 2017-11-28 DIAGNOSIS — L851 Acquired keratosis [keratoderma] palmaris et plantaris: Secondary | ICD-10-CM | POA: Diagnosis not present

## 2017-11-28 DIAGNOSIS — M79672 Pain in left foot: Secondary | ICD-10-CM | POA: Diagnosis not present

## 2017-12-04 ENCOUNTER — Ambulatory Visit: Payer: Medicare HMO | Admitting: Internal Medicine

## 2017-12-04 NOTE — Progress Notes (Deleted)
Date:  12/04/2017   Name:  Donna Riley   DOB:  [birth year]   MRN:  [id number]   Chief Complaint: No chief complaint on file. Diabetes  She presents for her follow-up diabetic visit. She has type 2 diabetes mellitus. Current diabetic treatment includes oral agent (monotherapy) (metformin).  Hypertension  This is a chronic problem. The problem is unchanged. Past treatments include ACE inhibitors, calcium channel blockers and diuretics. The current treatment provides significant improvement.   Lab Results  Component Value Date   HGBA1C 6.2 (H) 05/26/2017   Lab Results  Component Value Date   CREATININE 0.71 06/26/2017   BUN 11 06/26/2017   NA 139 06/26/2017   K 3.9 06/26/2017   CL 105 06/26/2017   CO2 24 06/26/2017      Review of Systems  Patient Active Problem List   Diagnosis Date Noted  . Mastitis 10/15/2016  . Fever and chills   . Breast pain, left 10/14/2016  . Arthritis   . Elevated LFTs   . Personal history of chemotherapy   . Positive PPD, treated   . Personal history of radiation therapy   . Fatigue 08/01/2016  . Umbilical hernia without obstruction and without gangrene 07/13/2016  . Breast wound, left, sequela 06/23/2016  . Special screening for malignant neoplasms, colon   . Rectal polyp   . Irritable bowel syndrome with both constipation and diarrhea 07/20/2015  . Controlled type 2 diabetes mellitus without complication, without long-term current use of insulin ([place]) 06/22/2015  . Pre-ulcerative calluses 06/22/2015  . Neuropathy 06/22/2015  . Hot flash, menopausal 04/09/2015  . Essential (primary) hypertension 03/25/2015  . 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 Osteopenia 03/12/2015  . Hydradenitis 01/23/2015  . Breast cancer ([place]) 05/30/2014  . 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 Authorizing Provider  acetaminophen (TYLENOL) 325 MG tablet Take 650 mg by  mouth daily as needed. 09/08/13   [provider]  Alcohol Swabs (B-D SINGLE USE SWABS REGULAR) PADS 1 each by Does not apply route 2 (two) times daily. 11/22/17   Glean Hess, MD  amLODipine (NORVASC) 10 MG tablet Take 1 tablet (10 mg total) by mouth daily. 11/23/17   Glean Hess, MD  anastrozole (ARIMIDEX) 1 MG tablet Take 1 tablet (1 mg total) by mouth daily. 11/15/17   Lequita Asal, MD  aspirin EC 81 MG tablet Take 81 mg by mouth daily.    [provider]  Blood Glucose Calibration (TRUE METRIX LEVEL 1) Low SOLN 1 each by In Vitro route daily as needed. 11/22/17   Glean Hess, MD  lisinopril (PRINIVIL,ZESTRIL) 40 MG tablet Take 1 tablet (40 mg total) by mouth daily. 11/23/17   Glean Hess, MD  metFORMIN (GLUCOPHAGE) 500 MG tablet Take 1 tablet (500 mg total) by mouth every morning. 11/23/17   Glean Hess, MD  triamterene-hydrochlorothiazide (MAXZIDE-25) 37.5-25 MG tablet Take 1 tablet by mouth daily. 01/25/17   Glean Hess, MD    No Known Allergies  Past Surgical History:  Procedure Laterality Date  . BREAST BIOPSY Left 07/31/2014   positive. Lumpectomy 08/27/2014 with chemo and rad  . BREAST BIOPSY Left 03/08/2016   INTRADUCTAL PAPILLOMA WITH SCLEROSIS AND CALCIFICATIONS  . BREAST BIOPSY Left 03/08/2016   FAT NECROSIS WITH FIBROSIS  . BREAST LUMPECTOMY WITH NEEDLE LOCALIZATION Left 04/05/2016   Procedure: BREAST LUMPECTOMY WITH NEEDLE  LOCALIZATION;  Surgeon: Hubbard Robinson, MD;  Location: [facility];  Service: General;  Laterality: Left;  . BREAST LUMPECTOMY WITH NEEDLE LOCALIZATION AND AXILLARY SENTINEL LYMPH NODE BX Left 08/27/14  . CHOLECYSTECTOMY  09/07/2013  . COLONOSCOPY WITH PROPOFOL N/A 08/28/2015   Procedure: COLONOSCOPY WITH PROPOFOL;  Surgeon: Lucilla Lame, MD;  Location: [place];  Service: Endoscopy;  Laterality: N/A;  Diabetic oral  . ESOPHAGOGASTRODUODENOSCOPY  2014   gastritis  . POLYPECTOMY  08/28/2015    Procedure: POLYPECTOMY INTESTINAL;  Surgeon: Lucilla Lame, MD;  Location: [place];  Service: Endoscopy;;  Rectal polyp  . TUBAL LIGATION Bilateral   . UMBILICAL HERNIA REPAIR N/A 08/09/2016   Procedure: HERNIA REPAIR UMBILICAL ADULT;  Surgeon: Olean Ree, MD;  Location: [facility];  Service: General;  Laterality: N/A;  . VENTRAL HERNIA REPAIR N/A 04/10/2015   Procedure: HERNIA REPAIR VENTRAL ADULT;  Surgeon: Leonie Green, MD;  Location: [facility];  Service: General;  Laterality: N/A;    Social History   Tobacco Use  . Smoking status: Current Every Day Smoker    Packs/day: 0.50    Years: 38.00    Pack years: 19.00    Types: Cigarettes  . Smokeless tobacco: Never Used  . Tobacco comment: Smoke 8 cigerattes a day.   Substance Use Topics  . Alcohol use: Yes    Alcohol/week: 1.2 oz    Types: 2 Cans of beer per week    Comment: Occasionally   . Drug use: No     Medication list has been reviewed and updated.  No outpatient medications have been marked as taking for the 12/04/17 encounter (Appointment) with Glean Hess, MD.    [facility] 2/9 Scores 11/08/2017 11/03/2017 05/18/2017 04/18/2016  PHQ - 2 Score 0 0 0 0  PHQ- 9 Score - 3 - -    Physical Exam  There were no vitals taken for this visit.  Assessment and Plan:  1. Controlled type 2 diabetes mellitus without complication, without long-term current use of insulin ([facility]) ***  2. Essential (primary) hypertension ***

## 2017-12-07 ENCOUNTER — Ambulatory Visit: Payer: Medicare HMO | Admitting: Internal Medicine

## 2017-12-15 ENCOUNTER — Other Ambulatory Visit
Admission: RE | Admit: 2017-12-15 | Discharge: 2017-12-15 | Disposition: A | Discharge status: Home / Self Care | Payer: Medicare HMO | Source: Ambulatory Visit | Attending: Internal Medicine | Admitting: Internal Medicine

## 2017-12-15 ENCOUNTER — Ambulatory Visit (INDEPENDENT_AMBULATORY_CARE_PROVIDER_SITE_OTHER): Payer: Medicare HMO | Admitting: Internal Medicine

## 2017-12-15 ENCOUNTER — Encounter: Payer: Self-pay | Admitting: Internal Medicine

## 2017-12-15 VITALS — BP 148/80 | HR 91 | Ht 67.0 in | Wt 211.0 lb

## 2017-12-15 DIAGNOSIS — N760 Acute vaginitis: Secondary | ICD-10-CM | POA: Diagnosis not present

## 2017-12-15 DIAGNOSIS — I10 Essential (primary) hypertension: Secondary | ICD-10-CM | POA: Diagnosis not present

## 2017-12-15 DIAGNOSIS — M109 Gout, unspecified: Secondary | ICD-10-CM | POA: Diagnosis not present

## 2017-12-15 DIAGNOSIS — E119 Type 2 diabetes mellitus without complications: Secondary | ICD-10-CM

## 2017-12-15 LAB — BASIC METABOLIC PANEL
Anion gap: 13 (ref 5–15)
BUN: 12 mg/dL (ref 6–20)
CO2: 23 mmol/L (ref 22–32)
Calcium: 9.5 mg/dL (ref 8.9–10.3)
Chloride: 104 mmol/L (ref 98–111)
Creatinine, Ser: 0.88 mg/dL (ref 0.44–1.00)
GFR calc Af Amer: >60 mL/min (ref >60)
GFR calc non Af Amer: >60 mL/min (ref >60)
Glucose, Bld: 123 mg/dL — ABNORMAL HIGH (ref 70–99)
Potassium: 4.2 mmol/L (ref 3.5–5.1)
Sodium: 140 mmol/L (ref 135–145)

## 2017-12-15 LAB — URIC ACID: Uric Acid, Serum: 7 mg/dL (ref 2.5–7.1)

## 2017-12-15 LAB — HEMOGLOBIN A1C
Hgb A1c MFr Bld: 6.6 % — ABNORMAL HIGH (ref 4.8–5.6)
Mean Plasma Glucose: 142.72 mg/dL

## 2017-12-15 MED ORDER — TRUE METRIX METER DEVI: 1.0000 | Freq: Every day | 3 refills | Status: AC

## 2017-12-15 MED ORDER — COLCHICINE 0.6 MG PO TABS: 0.6000 mg | ORAL_TABLET | Freq: Two times a day (BID) | ORAL | 1 refills | Status: DC

## 2017-12-15 MED ORDER — LANCETS MICRO THIN 33G MISC: 1.0000 | Freq: Every day | 3 refills | Status: DC

## 2017-12-15 MED ORDER — GLUCOSE BLOOD VI STRP: ORAL_STRIP | 12 refills | Status: DC

## 2017-12-15 NOTE — Progress Notes (Signed)
Date:  12/15/2017   Name:  Donna Riley   DOB:  Jan 26, 1961   MRN:  [id number]   Chief Complaint: Diabetes; Hypertension; and Edema (Foot swelling about a week now and painful to touch. Left foot. ) Diabetes  She presents for her follow-up diabetic visit. She has type 2 diabetes mellitus. Pertinent negatives for hypoglycemia include no dizziness, headaches or tremors. Pertinent negatives for diabetes include no chest pain, no fatigue, no polydipsia and no polyuria. Current diabetic treatment includes oral agent (monotherapy). She is compliant with treatment most of the time. There is no compliance with monitoring of blood glucose. An ACE inhibitor/angiotensin II receptor blocker is being taken. Eye exam is not current.  Hypertension  This is a chronic problem. The problem is controlled. Pertinent negatives include no chest pain, headaches, palpitations or shortness of breath. Past treatments include diuretics, ACE inhibitors and calcium channel blockers. The current treatment provides moderate improvement. There are no compliance problems.   Foot Injury   There was no injury mechanism. The pain is present in the left foot. The quality of the pain is described as aching and burning. The pain is moderate. The pain has been constant since onset. Associated symptoms include an inability to bear weight. Pertinent negatives include no numbness or tingling. She reports no foreign bodies present. The symptoms are aggravated by weight bearing and palpation (also recently consumed wine several days in a row - usually does not drink). She has tried nothing for the symptoms.     Review of Systems  Constitutional: Negative for appetite change, fatigue, fever and unexpected weight change.  HENT: Negative for tinnitus and trouble swallowing.   Eyes: Negative for visual disturbance.  Respiratory: Negative for cough, chest tightness and shortness of breath.   Cardiovascular: Negative for chest pain,  palpitations and leg swelling.  Gastrointestinal: Negative for abdominal pain.  Endocrine: Negative for polydipsia and polyuria.  Genitourinary: Negative for dysuria and hematuria.  Musculoskeletal: Positive for arthralgias and joint swelling.  Skin: Positive for color change. Negative for rash.  Neurological: Negative for dizziness, tingling, tremors, numbness and headaches.  Psychiatric/Behavioral: Negative for dysphoric mood and sleep disturbance.    Patient Active Problem List   Diagnosis Date Noted  . Mastitis 10/15/2016  . Fever and chills   . Breast pain, left 10/14/2016  . Arthritis   . Elevated LFTs   . Personal history of chemotherapy   . Positive PPD, treated   . Personal history of radiation therapy   . Fatigue 08/01/2016  . Umbilical hernia without obstruction and without gangrene 07/13/2016  . Breast wound, left, sequela 06/23/2016  . Special screening for malignant neoplasms, colon   . Rectal polyp   . Irritable bowel syndrome with both constipation and diarrhea 07/20/2015  . Controlled type 2 diabetes mellitus without complication, without long-term current use of insulin ([facility]) 06/22/2015  . Pre-ulcerative calluses 06/22/2015  . Neuropathy 06/22/2015  . Hot flash, menopausal 04/09/2015  . Essential (primary) hypertension 03/25/2015  . 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 Osteopenia 03/12/2015  . Hydradenitis 01/23/2015  . Breast cancer ([place]) 05/30/2014  . 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 Authorizing Provider  acetaminophen (TYLENOL) 325 MG tablet Take 650 mg by mouth daily as needed. 09/08/13  Yes [provider]  Alcohol Swabs (B-D SINGLE USE SWABS REGULAR) PADS 1 each by Does not apply route 2 (two) times  daily. 11/22/17  Yes Glean Hess, MD  amLODipine (NORVASC) 10 MG tablet Take 1 tablet (10 mg total) by mouth daily. 11/23/17  Yes Glean Hess,  MD  anastrozole (ARIMIDEX) 1 MG tablet Take 1 tablet (1 mg total) by mouth daily. 11/15/17  Yes Lequita Asal, MD  aspirin EC 81 MG tablet Take 81 mg by mouth daily.   Yes [provider]  Blood Glucose Calibration (TRUE METRIX LEVEL 1) Low SOLN 1 each by In Vitro route daily as needed. 11/22/17  Yes Glean Hess, MD  lisinopril (PRINIVIL,ZESTRIL) 40 MG tablet Take 1 tablet (40 mg total) by mouth daily. 11/23/17  Yes Glean Hess, MD  metFORMIN (GLUCOPHAGE) 500 MG tablet Take 1 tablet (500 mg total) by mouth every morning. 11/23/17  Yes Glean Hess, MD  triamterene-hydrochlorothiazide (MAXZIDE-25) 37.5-25 MG tablet Take 1 tablet by mouth daily. 01/25/17  Yes Glean Hess, MD    No Known Allergies  Past Surgical History:  Procedure Laterality Date  . BREAST BIOPSY Left 07/31/2014   positive. Lumpectomy 08/27/2014 with chemo and rad  . BREAST BIOPSY Left 03/08/2016   INTRADUCTAL PAPILLOMA WITH SCLEROSIS AND CALCIFICATIONS  . BREAST BIOPSY Left 03/08/2016   FAT NECROSIS WITH FIBROSIS  . BREAST LUMPECTOMY WITH NEEDLE LOCALIZATION Left 04/05/2016   Procedure: BREAST LUMPECTOMY WITH NEEDLE LOCALIZATION;  Surgeon: Hubbard Robinson, MD;  Location: [facility];  Service: General;  Laterality: Left;  . BREAST LUMPECTOMY WITH NEEDLE LOCALIZATION AND AXILLARY SENTINEL LYMPH NODE BX Left 08/27/14  . CHOLECYSTECTOMY  09/07/2013  . COLONOSCOPY WITH PROPOFOL N/A 08/28/2015   Procedure: COLONOSCOPY WITH PROPOFOL;  Surgeon: Lucilla Lame, MD;  Location: [facility];  Service: Endoscopy;  Laterality: N/A;  Diabetic oral  . ESOPHAGOGASTRODUODENOSCOPY  2014   gastritis  . POLYPECTOMY  08/28/2015   Procedure: POLYPECTOMY INTESTINAL;  Surgeon: Lucilla Lame, MD;  Location: [facility];  Service: Endoscopy;;  Rectal polyp  . TUBAL LIGATION Bilateral   . UMBILICAL HERNIA REPAIR N/A 08/09/2016   Procedure: HERNIA REPAIR UMBILICAL ADULT;  Surgeon: Olean Ree, MD;  Location:  [facility];  Service: General;  Laterality: N/A;  . VENTRAL HERNIA REPAIR N/A 04/10/2015   Procedure: HERNIA REPAIR VENTRAL ADULT;  Surgeon: Leonie Green, MD;  Location: [facility];  Service: General;  Laterality: N/A;    Social History   Tobacco Use  . Smoking status: Current Every Day Smoker    Packs/day: 0.50    Years: 38.00    Pack years: 19.00    Types: Cigarettes  . Smokeless tobacco: Never Used  . Tobacco comment: Smoke 8 cigerattes a day.   Substance Use Topics  . Alcohol use: Yes    Alcohol/week: 1.2 oz    Types: 2 Cans of beer per week    Comment: Occasionally   . Drug use: No     Medication list has been reviewed and updated.  Current Meds  Medication Sig  . acetaminophen (TYLENOL) 325 MG tablet Take 650 mg by mouth daily as needed.  . Alcohol Swabs (B-D SINGLE USE SWABS REGULAR) PADS 1 each by Does not apply route 2 (two) times daily.  Marland Kitchen amLODipine (NORVASC) 10 MG tablet Take 1 tablet (10 mg total) by mouth daily.  Marland Kitchen anastrozole (ARIMIDEX) 1 MG tablet Take 1 tablet (1 mg total) by mouth daily.  Marland Kitchen aspirin EC 81 MG tablet Take 81 mg by mouth daily.  . Blood Glucose Calibration (TRUE METRIX LEVEL 1) Low SOLN  1 each by In Vitro route daily as needed.  Marland Kitchen lisinopril (PRINIVIL,ZESTRIL) 40 MG tablet Take 1 tablet (40 mg total) by mouth daily.  . metFORMIN (GLUCOPHAGE) 500 MG tablet Take 1 tablet (500 mg total) by mouth every morning.  . triamterene-hydrochlorothiazide (MAXZIDE-25) 37.5-25 MG tablet Take 1 tablet by mouth daily.    PHQ 2/9 Scores 11/08/2017 11/03/2017 05/18/2017 04/18/2016  PHQ - 2 Score 0 0 0 0  PHQ- 9 Score - 3 - -    Physical Exam  Constitutional: She is oriented to person, place, and time. She appears well-developed. No distress.  HENT:  Head: Normocephalic and atraumatic.  Neck: Normal range of motion. Neck supple.  Cardiovascular: Normal rate, regular rhythm and normal heart sounds.  Pulmonary/Chest: Effort normal and breath sounds normal.  No respiratory distress.  Musculoskeletal: Normal range of motion.       Left ankle: Normal.       Feet:  Lymphadenopathy:    She has no cervical adenopathy.  Neurological: She is alert and oriented to person, place, and time.  Skin: Skin is warm and dry. No rash noted.  Psychiatric: She has a normal mood and affect. Her behavior is normal. Thought content normal.  Nursing note and vitals reviewed.  BP Readings from Last 3 Encounters:  12/15/17 (!) 148/80  11/08/17 (!) 154/84  11/03/17 138/80     BP (!) 148/80   Pulse 91   Ht 5\' 7"  (1.702 m)   Wt 211 lb (95.7 kg)   SpO2 98%   BMI 33.05 kg/m   Assessment and Plan: 1. Controlled type 2 diabetes mellitus without complication, without long-term current use of insulin ([facility]) Continue metformin Supplies for testing sent to mail order - Blood Glucose Monitoring Suppl (TRUE METRIX METER) DEVI; 1 each by Does not apply route daily.  Dispense: 100 Device; Refill: 3 - glucose blood test strip; Use as instructed  Dispense: 100 each; Refill: 12 - LANCETS MICRO THIN 33G MISC; 1 each by Does not apply route daily.  Dispense: 100 each; Refill: 3 - Hemoglobin Z6X - Basic metabolic panel  2. Essential (primary) hypertension Fair control - likely elevated today due to pain Continue triple therapy  3. Acute gout of left foot, unspecified cause Suspect acute gout - treat presumptively and return if needed Avoid high purine foods and alcohol Elevate and ice - colchicine 0.6 MG tablet; Take 1 tablet (0.6 mg total) by mouth 2 (two) times daily. Up to 5 days as needed  Dispense: 30 tablet; Refill: 1 - Uric acid   Meds ordered this encounter  Medications  . Blood Glucose Monitoring Suppl (TRUE METRIX METER) DEVI    Sig: 1 each by Does not apply route daily.    Dispense:  100 Device    Refill:  3  . glucose blood test strip    Sig: Use as instructed    Dispense:  100 each    Refill:  12  . LANCETS MICRO THIN 33G MISC    Sig: 1 each by  Does not apply route daily.    Dispense:  100 each    Refill:  3  . colchicine 0.6 MG tablet    Sig: Take 1 tablet (0.6 mg total) by mouth 2 (two) times daily. Up to 5 days as needed    Dispense:  30 tablet    Refill:  1    Partially dictated using Editor, commissioning. Any errors are unintentional.  Halina Maidens, MD [facility]  12/15/2017   There are no diagnoses linked to this encounter.

## 2017-12-15 NOTE — Patient Instructions (Signed)

## 2017-12-19 ENCOUNTER — Telehealth: Payer: Self-pay

## 2017-12-19 NOTE — Telephone Encounter (Signed)
Patient called back requesting lab results. Informed Labs are good. Uric acid level is borderline high so call if no improvement in foot.  She verbalized understanding.  cnm

## 2017-12-22 ENCOUNTER — Other Ambulatory Visit: Payer: Self-pay | Admitting: *Deleted

## 2017-12-22 DIAGNOSIS — C50912 Malignant neoplasm of unspecified site of left female breast: Secondary | ICD-10-CM

## 2017-12-22 DIAGNOSIS — Z170 Estrogen receptor positive status [ER+]: Principal | ICD-10-CM

## 2017-12-24 NOTE — Progress Notes (Signed)
[facility] day:  12/25/17  Chief Complaint: Donna Riley is a 57 y.o. female with stage IIA left breast cancer who is seen for 6 month assessment.  HPI: The patient was last seen in the medical oncology clinic on 06/26/2017.  At that time, patient was doing "alright". She complained of fatigue and memory issues. Patient also experiencing gross hematuria and was undergoing an evaluation for possible nephrolithiasis. She continued on her Arimidex with no perceived side effects. Exam was stable.   Routine mammogram on 09/14/2017 revealed maturing fat necrosis at the LEFT lumpectomy site. There was no mammographic evidence of malignancy in either breast.  She was seen in follow up consult on 11/08/2017 by Dr. [name] (radiation oncology). Notes reviewed. Patient doing well following treatment. No concerns. Patient to follow up in 1 year.   In the interim, patient is doing well. She denies any acute complaints today.  Patient denies that she has experienced any B symptoms. She denies any interval infections.  Patient does not verbalize any concerns with regards to her breasts today. Patient performs monthly self breast examinations as recommended.   Patient advises that she maintains an adequate appetite. She is eating well. Weight today is 214 lb 3 oz (97.2 kg), which compared to her last visit to the clinic, represents a 6 pound increase.   Patient denies pain in the clinic today.   Past Medical History:  Diagnosis Date  . Arthritis    shoulders and neck  . Breast cancer ([place]) 2016    Left breast with chemo + rad tx's with lumpectomy.  . Diabetes mellitus without complication ([place]) 07/5684  . Elevated LFTs   . Fatigue 08/01/2016  . Hypertension   . Personal history of chemotherapy   . Personal history of radiation therapy   . Positive PPD, treated   . Shortness of breath dyspnea    with exertion    Past Surgical History:   Procedure Laterality Date  . BREAST BIOPSY Left 07/31/2014   positive. Lumpectomy 08/27/2014 with chemo and rad  . BREAST BIOPSY Left 03/08/2016   INTRADUCTAL PAPILLOMA WITH SCLEROSIS AND CALCIFICATIONS  . BREAST BIOPSY Left 03/08/2016   FAT NECROSIS WITH FIBROSIS  . BREAST LUMPECTOMY WITH NEEDLE LOCALIZATION Left 04/05/2016   Procedure: BREAST LUMPECTOMY WITH NEEDLE LOCALIZATION;  Surgeon: Hubbard Robinson, MD;  Location: [facility];  Service: General;  Laterality: Left;  . BREAST LUMPECTOMY WITH NEEDLE LOCALIZATION AND AXILLARY SENTINEL LYMPH NODE BX Left 08/27/14  . CHOLECYSTECTOMY  09/07/2013  . COLONOSCOPY WITH PROPOFOL N/A 08/28/2015   Procedure: COLONOSCOPY WITH PROPOFOL;  Surgeon: Lucilla Lame, MD;  Location: [facility];  Service: Endoscopy;  Laterality: N/A;  Diabetic oral  . ESOPHAGOGASTRODUODENOSCOPY  2014   gastritis  . POLYPECTOMY  08/28/2015   Procedure: POLYPECTOMY INTESTINAL;  Surgeon: Lucilla Lame, MD;  Location: [facility];  Service: Endoscopy;;  Rectal polyp  . TUBAL LIGATION Bilateral   . UMBILICAL HERNIA REPAIR N/A 08/09/2016   Procedure: HERNIA REPAIR UMBILICAL ADULT;  Surgeon: Olean Ree, MD;  Location: [facility];  Service: General;  Laterality: N/A;  . VENTRAL HERNIA REPAIR N/A 04/10/2015   Procedure: HERNIA REPAIR VENTRAL ADULT;  Surgeon: Leonie Green, MD;  Location: [facility];  Service: General;  Laterality: N/A;    Family History  Problem Relation Age of Onset  . Cancer Mother        Pancreatic  . Cancer Father  Throat  . Healthy Sister   . HIV Brother   . Cancer Maternal Grandmother        Breast  . Breast cancer Maternal Grandmother 75    Social History:  reports that she has been smoking cigarettes.  She has a 19.00 pack-year smoking history. She has never used smokeless tobacco. She reports that she drinks about 1.2 oz of alcohol per week. She reports that she does not use drugs.  She lives in [place].  The patient is alone  today.  Allergies: No Known Allergies  Current Medications: Current Outpatient Medications  Medication Sig Dispense Refill  . acetaminophen (TYLENOL) 325 MG tablet Take 650 mg by mouth daily as needed.    . Alcohol Swabs (B-D SINGLE USE SWABS REGULAR) PADS 1 each by Does not apply route 2 (two) times daily. 100 each 3  . amLODipine (NORVASC) 10 MG tablet Take 1 tablet (10 mg total) by mouth daily. 90 tablet 0  . anastrozole (ARIMIDEX) 1 MG tablet Take 1 tablet (1 mg total) by mouth daily. 90 tablet 0  . aspirin EC 81 MG tablet Take 81 mg by mouth daily.    . Blood Glucose Calibration (TRUE METRIX LEVEL 1) Low SOLN 1 each by In Vitro route daily as needed. 1 each 3  . Blood Glucose Monitoring Suppl (TRUE METRIX METER) DEVI 1 each by Does not apply route daily. 100 Device 3  . colchicine 0.6 MG tablet Take 1 tablet (0.6 mg total) by mouth 2 (two) times daily. Up to 5 days as needed 30 tablet 1  . glucose blood test strip Use as instructed 100 each 12  . LANCETS MICRO THIN 33G MISC 1 each by Does not apply route daily. 100 each 3  . lisinopril (PRINIVIL,ZESTRIL) 40 MG tablet Take 1 tablet (40 mg total) by mouth daily. 90 tablet 0  . metFORMIN (GLUCOPHAGE) 500 MG tablet Take 1 tablet (500 mg total) by mouth every morning. 90 tablet 0  . triamterene-hydrochlorothiazide (MAXZIDE-25) 37.5-25 MG tablet Take 1 tablet by mouth daily. 90 tablet 3   No current facility-administered medications for this visit.     Review of Systems  Constitutional: Negative for diaphoresis, fever, malaise/fatigue and weight loss.  HENT: Negative.   Eyes: Negative.   Respiratory: Negative for cough, hemoptysis, sputum production and shortness of breath.   Cardiovascular: Negative for chest pain, palpitations, orthopnea, leg swelling and PND.  Gastrointestinal: Negative for abdominal pain, blood in stool, constipation, diarrhea, melena, nausea and vomiting.       Last colonoscopy was 08/14/2015.   Genitourinary:  Negative for dysuria, frequency, hematuria and urgency.  Musculoskeletal: Negative for back pain, falls, joint pain and myalgias.  Skin: Negative for itching and rash.  Neurological: Negative for dizziness, tremors, weakness and headaches.  Endo/Heme/Allergies: Does not bruise/bleed easily.  Psychiatric/Behavioral: Negative for depression, memory loss (forgetful) and suicidal ideas. The patient is not nervous/anxious and does not have insomnia.   All other systems reviewed and are negative.  Performance status (ECOG): 1 - Symptomatic but completely ambulatory  Vital Signs BP (!) 148/87 (BP Location: Left Arm, Patient Position: Sitting)   Pulse 73   Temp (!) 95.9 F (35.5 C) (Tympanic)   Resp 18   Wt 214 lb 3 oz (97.2 kg)   BMI 33.55 kg/m   Physical Exam  Constitutional: She is oriented to person, place, and time and well-developed, well-nourished, and in no distress.  HENT:  Head: Normocephalic and atraumatic.  Brown hair with  braids.  Eyes: Pupils are equal, round, and reactive to light. EOM are normal. No scleral icterus.  Brown eyes  Neck: Normal range of motion. Neck supple. No tracheal deviation present. No thyromegaly present.  Cardiovascular: Normal rate, regular rhythm and normal heart sounds. Exam reveals no gallop and no friction rub.  No murmur heard. Pulmonary/Chest: Effort normal and breath sounds normal. No respiratory distress. She has no wheezes. She has no rales. Right breast exhibits skin change (Superior fibrocystic changes) and tenderness. Right breast exhibits no mass and no nipple discharge. Left breast exhibits skin change (Moderate post-surgical and post-radiation changes.  Arched incision. ) and tenderness. Left breast exhibits no mass and no nipple discharge.  Abdominal: Soft. Bowel sounds are normal. She exhibits no distension. There is no tenderness. A hernia (ventral) is present.  Musculoskeletal: Normal range of motion. She exhibits no edema or  tenderness.  Lymphadenopathy:    She has no cervical adenopathy.    Axillary adenopathy: right axillary fullness without discrete adenopathy.       Right: No inguinal and no supraclavicular adenopathy present.       Left: No inguinal and no supraclavicular adenopathy present.  Neurological: She is alert and oriented to person, place, and time.  Skin: Skin is warm and dry. No rash noted. No erythema.  Psychiatric: Mood, affect and judgment normal.  Nursing note and vitals reviewed.   Orders Only on 12/25/2017  Component Date Value Ref Range Status  . Sodium 12/25/2017 139  135 - 145 mmol/L Final  . Potassium 12/25/2017 3.7  3.5 - 5.1 mmol/L Final  . Chloride 12/25/2017 108  98 - 111 mmol/L Final  . CO2 12/25/2017 22  22 - 32 mmol/L Final  . Glucose, Bld 12/25/2017 88  70 - 99 mg/dL Final  . BUN 12/25/2017 10  6 - 20 mg/dL Final  . Creatinine, Ser 12/25/2017 0.75  0.44 - 1.00 mg/dL Final  . Calcium 12/25/2017 9.2  8.9 - 10.3 mg/dL Final  . Total Protein 12/25/2017 7.6  6.5 - 8.1 g/dL Final  . Albumin 12/25/2017 3.8  3.5 - 5.0 g/dL Final  . AST 12/25/2017 24  15 - 41 U/L Final  . ALT 12/25/2017 25  0 - 44 U/L Final  . Alkaline Phosphatase 12/25/2017 89  38 - 126 U/L Final  . Total Bilirubin 12/25/2017 0.3  0.3 - 1.2 mg/dL Final  . GFR calc non Af Amer 12/25/2017 >60  >60 mL/min Final  . GFR calc Af Amer 12/25/2017 >60  >60 mL/min Final   Comment: (NOTE) The eGFR has been calculated using the CKD EPI equation. This calculation has not been validated in all clinical situations. eGFR's persistently <60 mL/min signify possible Chronic Kidney Disease.   Georgiann Hahn gap 12/25/2017 9  5 - 15 Final   Performed at [facility], [address] [place], [place] [address]  . WBC 12/25/2017 7.8  3.6 - 11.0 K/uL Final  . RBC 12/25/2017 4.71  3.80 - 5.20 MIL/uL Final  . Hemoglobin 12/25/2017 12.8  12.0 - 16.0 g/dL Final  . HCT 12/25/2017 38.5  35.0 - 47.0 % Final  . MCV 12/25/2017 81.8   80.0 - 100.0 fL Final  . MCH 12/25/2017 27.3  26.0 - 34.0 pg Final  . MCHC 12/25/2017 33.3  32.0 - 36.0 g/dL Final  . RDW 12/25/2017 14.4  11.5 - 14.5 % Final  . Platelets 12/25/2017 228  150 - 440 K/uL Final  . Neutrophils Relative % 12/25/2017  54  % Final  . Neutro Abs 12/25/2017 4.2  1.4 - 6.5 K/uL Final  . Lymphocytes Relative 12/25/2017 38  % Final  . Lymphs Abs 12/25/2017 3.0  1.0 - 3.6 K/uL Final  . Monocytes Relative 12/25/2017 5  % Final  . Monocytes Absolute 12/25/2017 0.4  0.2 - 0.9 K/uL Final  . Eosinophils Relative 12/25/2017 2  % Final  . Eosinophils Absolute 12/25/2017 0.1  0 - 0.7 K/uL Final  . Basophils Relative 12/25/2017 1  % Final  . Basophils Absolute 12/25/2017 0.1  0 - 0.1 K/uL Final   Performed at [facility], [address], [place], [place] [address]    Assessment:  Donna Riley is a 57 y.o. African American female with stage IIA left breast cancer status post partial mastectomy and sentinel lymph node biopsy on 08/27/2014. 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 There was lymphovascular invasion. One of 2 sentinel lymph nodes were positive (focus of 2.8 mm). Tumor was > 90% ER positive, > 90% PR positive, and Her2/neu negative. Pathologic stage was [id number].  Bone scan on 09/16/2014 revealed abnormal focal uptake at the level of right L3 pedicle and L5 vertebral body. Lumbar spine MRI on 09/27/2014 revealed no evidence of metastatic disease with lower lumbar facet arthritis. Abdominal and pelvic CT scan on 09/24/2014 revealed hepatomegaly and no evidence of metastatic disease.   She received 4 cycles of Taxotere and Cytoxan (09/29/2014 - 12/09/2014) with Neulasta support. She received 50.4 Gy to the left breast from 01/12/2015 until 02/23/2015.  She was started on Femara on 03/12/2015, but switched to Arimidex on 03/30/2015 secondary to diffuse joint aches.    CA27.29 has  been followed: 16.9 on 09/09/2014, 12.1 on 06/02/2015, 15.6 on 08/07/2015, 17.4 on 11/23/2015, 13.6 on 02/22/2016, 18.9 on 06/20/2016, 18.4 on 10/21/2016, 15 on 02/23/2017, and 14.1 on 06/26/2017.  Bilateral diagnostic mammogram on 07/30/2015 revealed no evidence of malignancy.  Left sided mammogram and ultrasound on 02/29/2016 revealed a 1.5 x 0.6 x 0.7 cm intraductal mass.  Bilateral mammogram on 09/12/2016 revealed no focal fluid collection at the surgical site to account for the persistent drainage. There were no suspicious mammographic or targeted sonographic abnormalities at the lumpectomy site. There was no evidence of malignancy in the bilateral breasts. Bilateral mammogram on 09/14/2017 that revealed maturing fat necrosis at the LEFT lumpectomy site. There was no evidence of malignancy in either breast.  She had chronic left nipple discharge.  Left breast lumpectomy at the 11 o'clock position on 04/05/2016 revealed an intraductal papilloma with sclerosis and calcifications. There was fat necrosis with fibrosis and calcifications. Pathology was negative for atypia and malignancy  Bone density study on 09/15/2014 revealed osteopenia with a T-score of -2.1 at L1-L4.  She is on calcium and vitamin D.  Colonoscopy on 08/28/2015 revealed one 4 mm polyp in the rectum (tubular adenoma).  Symptomatically, patient is doing well overall. She denies any acute concerns. Patient denies B symptoms or recurrent infections. Exam reveals tender fibrocystic changes in the RIGHT breast + axillary tenderness and tender fibrocystic changes in the LEFT breast.   Plan: 1. Labs today:  CBC with diff, CMP, CA27.29. 2. Review interval mammogram - no evidence of malignancy.  3. Breast cancer:  Doing well with no side effects on endocrine therapy (Arimidex).  Continue as previously prescribed.  4. Osteopenia:  Schedule bone density (overdue). 5. Discuss the use of Prolia to  prevent bone thinning/loss associated with  endocrine therapy. Side effects of this medication reviewed. Patient provided with printed information on Prolia on today's AVS. Patient encouraged to take calcium 1200 mg and vitamin D 800 IU daily. Discussed the need for dental clearance prior to beginning Prolia, as this medication is associated with an increased risk of dental complications such as osteonecrosis of the jaw.  6. Preauthorize Prolia.  7. Re: RIGHT axillary tenderness. Will send patient for ultrasound to assess fullness and tenderness.  8. RTC for MD assessment and review of imaging (bone density and ultrasound).    Honor Loh, NP  12/25/17, 11:46 AM   I saw and evaluated the patient, participating in the key portions of the service and reviewing pertinent diagnostic studies and records.  I reviewed the nurse practitioner's note and agree with the findings and the plan.  The assessment and plan were discussed with the patient.  Multiple questions were asked by the patient and answered.   Nolon Stalls, MD 12/25/2017,11:46 AM

## 2017-12-25 ENCOUNTER — Inpatient Hospital Stay: Payer: Medicare HMO | Attending: Hematology and Oncology

## 2017-12-25 ENCOUNTER — Encounter: Payer: Self-pay | Admitting: Hematology and Oncology

## 2017-12-25 ENCOUNTER — Other Ambulatory Visit: Payer: Self-pay

## 2017-12-25 ENCOUNTER — Inpatient Hospital Stay (HOSPITAL_BASED_OUTPATIENT_CLINIC_OR_DEPARTMENT_OTHER): Payer: Medicare HMO | Admitting: Hematology and Oncology

## 2017-12-25 ENCOUNTER — Other Ambulatory Visit: Payer: Self-pay | Admitting: Urgent Care

## 2017-12-25 VITALS — BP 148/87 | HR 73 | Temp 95.9°F | Resp 18 | Wt 214.2 lb

## 2017-12-25 DIAGNOSIS — Z170 Estrogen receptor positive status [ER+]: Secondary | ICD-10-CM | POA: Diagnosis not present

## 2017-12-25 DIAGNOSIS — M8588 Other specified disorders of bone density and structure, other site: Secondary | ICD-10-CM | POA: Diagnosis not present

## 2017-12-25 DIAGNOSIS — C50912 Malignant neoplasm of unspecified site of left female breast: Secondary | ICD-10-CM

## 2017-12-25 DIAGNOSIS — M79621 Pain in right upper arm: Secondary | ICD-10-CM | POA: Insufficient documentation

## 2017-12-25 DIAGNOSIS — Z79811 Long term (current) use of aromatase inhibitors: Secondary | ICD-10-CM | POA: Diagnosis not present

## 2017-12-25 LAB — CBC WITH DIFFERENTIAL/PLATELET
Basophils Absolute: 0.1 10*3/uL (ref 0–0.1)
Basophils Relative: 1 %
Eosinophils Absolute: 0.1 10*3/uL (ref 0–0.7)
Eosinophils Relative: 2 %
HCT: 38.5 % (ref 35.0–47.0)
Hemoglobin: 12.8 g/dL (ref 12.0–16.0)
Lymphocytes Relative: 38 %
Lymphs Abs: 3 10*3/uL (ref 1.0–3.6)
MCH: 27.3 pg (ref 26.0–34.0)
MCHC: 33.3 g/dL (ref 32.0–36.0)
MCV: 81.8 fL (ref 80.0–100.0)
Monocytes Absolute: 0.4 10*3/uL (ref 0.2–0.9)
Monocytes Relative: 5 %
Neutro Abs: 4.2 10*3/uL (ref 1.4–6.5)
Neutrophils Relative %: 54 %
Platelets: 228 10*3/uL (ref 150–440)
RBC: 4.71 MIL/uL (ref 3.80–5.20)
RDW: 14.4 % (ref 11.5–14.5)
WBC: 7.8 10*3/uL (ref 3.6–11.0)

## 2017-12-25 LAB — COMPREHENSIVE METABOLIC PANEL
ALT: 25 U/L (ref 0–44)
AST: 24 U/L (ref 15–41)
Albumin: 3.8 g/dL (ref 3.5–5.0)
Alkaline Phosphatase: 89 U/L (ref 38–126)
Anion gap: 9 (ref 5–15)
BUN: 10 mg/dL (ref 6–20)
CO2: 22 mmol/L (ref 22–32)
Calcium: 9.2 mg/dL (ref 8.9–10.3)
Chloride: 108 mmol/L (ref 98–111)
Creatinine, Ser: 0.75 mg/dL (ref 0.44–1.00)
GFR calc Af Amer: >60 mL/min (ref >60)
GFR calc non Af Amer: >60 mL/min (ref >60)
Glucose, Bld: 88 mg/dL (ref 70–99)
Potassium: 3.7 mmol/L (ref 3.5–5.1)
Sodium: 139 mmol/L (ref 135–145)
Total Bilirubin: 0.3 mg/dL (ref 0.3–1.2)
Total Protein: 7.6 g/dL (ref 6.5–8.1)

## 2017-12-25 NOTE — Patient Instructions (Signed)

## 2017-12-25 NOTE — Progress Notes (Signed)
Patient states she has nerve pain in her breast (burning, stinging).  States her appetite is not always good.  Sleep is off and on.

## 2017-12-26 LAB — CANCER ANTIGEN 27.29: CA 27.29: 15.6 U/mL (ref 0.0–38.6)

## 2018-01-01 ENCOUNTER — Other Ambulatory Visit: Payer: Self-pay | Admitting: Urgent Care

## 2018-01-01 ENCOUNTER — Ambulatory Visit
Admission: RE | Admit: 2018-01-01 | Discharge: 2018-01-01 | Disposition: A | Discharge status: Home / Self Care | Payer: Medicare HMO | Source: Ambulatory Visit | Attending: Urgent Care | Admitting: Urgent Care

## 2018-01-01 DIAGNOSIS — Z170 Estrogen receptor positive status [ER+]: Principal | ICD-10-CM

## 2018-01-01 DIAGNOSIS — M79621 Pain in right upper arm: Secondary | ICD-10-CM

## 2018-01-01 DIAGNOSIS — M79622 Pain in left upper arm: Secondary | ICD-10-CM | POA: Insufficient documentation

## 2018-01-01 DIAGNOSIS — R928 Other abnormal and inconclusive findings on diagnostic imaging of breast: Secondary | ICD-10-CM | POA: Diagnosis not present

## 2018-01-01 DIAGNOSIS — N644 Mastodynia: Secondary | ICD-10-CM | POA: Diagnosis not present

## 2018-01-01 DIAGNOSIS — C50912 Malignant neoplasm of unspecified site of left female breast: Secondary | ICD-10-CM

## 2018-01-17 ENCOUNTER — Ambulatory Visit
Admission: RE | Admit: 2018-01-17 | Discharge: 2018-01-17 | Disposition: A | Discharge status: Home / Self Care | Payer: Medicare HMO | Source: Ambulatory Visit | Attending: Urgent Care | Admitting: Urgent Care

## 2018-01-17 DIAGNOSIS — Z79811 Long term (current) use of aromatase inhibitors: Secondary | ICD-10-CM | POA: Diagnosis not present

## 2018-01-17 DIAGNOSIS — Z780 Asymptomatic menopausal state: Secondary | ICD-10-CM | POA: Diagnosis not present

## 2018-01-17 DIAGNOSIS — M8588 Other specified disorders of bone density and structure, other site: Secondary | ICD-10-CM | POA: Diagnosis not present

## 2018-01-17 DIAGNOSIS — M810 Age-related osteoporosis without current pathological fracture: Secondary | ICD-10-CM | POA: Diagnosis not present

## 2018-01-18 ENCOUNTER — Inpatient Hospital Stay: Payer: Medicare HMO | Admitting: Hematology and Oncology

## 2018-01-18 ENCOUNTER — Other Ambulatory Visit: Payer: Self-pay | Admitting: Hematology and Oncology

## 2018-01-18 NOTE — Progress Notes (Deleted)
[facility] day:  01/18/18  Chief Complaint: Donna Riley is a 57 y.o. female with stage IIA left breast cancer who is seen for review of interval imaging and discussion regarding direction of therapy.  HPI: The patient was last seen in the medical oncology clinic on 12/25/2017.  At that time, she was doing well overall. She denied any acute concerns. Patient denied any B symptoms. No recurrent infections. She noted a palpable abnormality and tenderness in the left axilla x 1-2 weeks.  Exam revealed fibrocystic changes in the RIGHT breast.   Left diagnostic mammogram and ultrasound on 01/01/2018 revealed no significant abnormality other than a single normal appearing lymph node over the axillary tail portion of the left breast in the area of palpable abnormality and tenderness.  Bone density on 01/17/2018 revealed osteoporosis with a T-score of -2.6 in the AP spine L1-L4.    Past Medical History:  Diagnosis Date  . Arthritis    shoulders and neck  . Breast cancer ([place]) 2016    Left breast with chemo + rad tx's with lumpectomy.  . Diabetes mellitus without complication ([place]) 11/5641  . Elevated LFTs   . Fatigue 08/01/2016  . Hypertension   . Personal history of chemotherapy   . Personal history of radiation therapy   . Positive PPD, treated   . Shortness of breath dyspnea    with exertion    Past Surgical History:  Procedure Laterality Date  . BREAST BIOPSY Left 07/31/2014   positive. Lumpectomy 08/27/2014 with chemo and rad  . BREAST BIOPSY Left 03/08/2016   INTRADUCTAL PAPILLOMA WITH SCLEROSIS AND CALCIFICATIONS  . BREAST BIOPSY Left 03/08/2016   FAT NECROSIS WITH FIBROSIS  . BREAST LUMPECTOMY WITH NEEDLE LOCALIZATION Left 04/05/2016   Procedure: BREAST LUMPECTOMY WITH NEEDLE LOCALIZATION;  Surgeon: Hubbard Robinson, MD;  Location: [facility];  Service: General;  Laterality: Left;  . BREAST LUMPECTOMY WITH NEEDLE LOCALIZATION AND  AXILLARY SENTINEL LYMPH NODE BX Left 08/27/14  . CHOLECYSTECTOMY  09/07/2013  . COLONOSCOPY WITH PROPOFOL N/A 08/28/2015   Procedure: COLONOSCOPY WITH PROPOFOL;  Surgeon: Lucilla Lame, MD;  Location: [facility];  Service: Endoscopy;  Laterality: N/A;  Diabetic oral  . ESOPHAGOGASTRODUODENOSCOPY  2014   gastritis  . POLYPECTOMY  08/28/2015   Procedure: POLYPECTOMY INTESTINAL;  Surgeon: Lucilla Lame, MD;  Location: [facility];  Service: Endoscopy;;  Rectal polyp  . TUBAL LIGATION Bilateral   . UMBILICAL HERNIA REPAIR N/A 08/09/2016   Procedure: HERNIA REPAIR UMBILICAL ADULT;  Surgeon: Olean Ree, MD;  Location: [facility];  Service: General;  Laterality: N/A;  . VENTRAL HERNIA REPAIR N/A 04/10/2015   Procedure: HERNIA REPAIR VENTRAL ADULT;  Surgeon: Leonie Green, MD;  Location: [facility];  Service: General;  Laterality: N/A;    Family History  Problem Relation Age of Onset  . Cancer Mother        Pancreatic  . Cancer Father        Throat  . Healthy Sister   . HIV Brother   . Cancer Maternal Grandmother        Breast  . Breast cancer Maternal Grandmother 75    Social History:  reports that she has been smoking cigarettes. She has a 19.00 pack-year smoking history. She has never used smokeless tobacco. She reports that she drinks about 2.0 standard drinks of alcohol per week. She reports that she does not use drugs.  She lives in [place].  The patient is alone today.  Allergies: No Known Allergies  Current Medications: Current Outpatient Medications  Medication Sig Dispense Refill  . acetaminophen (TYLENOL) 325 MG tablet Take 650 mg by mouth daily as needed.    . Alcohol Swabs (B-D SINGLE USE SWABS REGULAR) PADS 1 each by Does not apply route 2 (two) times daily. 100 each 3  . amLODipine (NORVASC) 10 MG tablet Take 1 tablet (10 mg total) by mouth daily. 90 tablet 0  . anastrozole (ARIMIDEX) 1 MG tablet Take 1 tablet (1 mg total) by mouth daily. 90 tablet 0  .  aspirin EC 81 MG tablet Take 81 mg by mouth daily.    . Blood Glucose Calibration (TRUE METRIX LEVEL 1) Low SOLN 1 each by In Vitro route daily as needed. 1 each 3  . Blood Glucose Monitoring Suppl (TRUE METRIX METER) DEVI 1 each by Does not apply route daily. 100 Device 3  . colchicine 0.6 MG tablet Take 1 tablet (0.6 mg total) by mouth 2 (two) times daily. Up to 5 days as needed 30 tablet 1  . glucose blood test strip Use as instructed 100 each 12  . LANCETS MICRO THIN 33G MISC 1 each by Does not apply route daily. 100 each 3  . lisinopril (PRINIVIL,ZESTRIL) 40 MG tablet Take 1 tablet (40 mg total) by mouth daily. 90 tablet 0  . metFORMIN (GLUCOPHAGE) 500 MG tablet Take 1 tablet (500 mg total) by mouth every morning. 90 tablet 0  . triamterene-hydrochlorothiazide (MAXZIDE-25) 37.5-25 MG tablet Take 1 tablet by mouth daily. 90 tablet 3   No current facility-administered medications for this visit.     Review of Systems  Constitutional: Negative for diaphoresis, fever, malaise/fatigue and weight loss.  HENT: Negative.   Eyes: Negative.   Respiratory: Negative for cough, hemoptysis, sputum production and shortness of breath.   Cardiovascular: Negative for chest pain, palpitations, orthopnea, leg swelling and PND.  Gastrointestinal: Negative for abdominal pain, blood in stool, constipation, diarrhea, melena, nausea and vomiting.       Last colonoscopy was 08/14/2015.   Genitourinary: Negative for dysuria, frequency, hematuria and urgency.  Musculoskeletal: Negative for back pain, falls, joint pain and myalgias.  Skin: Negative for itching and rash.  Neurological: Negative for dizziness, tremors, weakness and headaches.  Endo/Heme/Allergies: Does not bruise/bleed easily.  Psychiatric/Behavioral: Negative for depression, memory loss (forgetful) and suicidal ideas. The patient is not nervous/anxious and does not have insomnia.   All other systems reviewed and are negative.  Performance status  (ECOG): 1 - Symptomatic but completely ambulatory  Vital Signs There were no vitals taken for this visit.  Physical Exam  Constitutional: She is oriented to person, place, and time and well-developed, well-nourished, and in no distress.  HENT:  Head: Normocephalic and atraumatic.  Brown hair  Eyes: Pupils are equal, round, and reactive to light. EOM are normal. No scleral icterus.  Brown eyes  Neck: Normal range of motion. Neck supple. No tracheal deviation present. No thyromegaly present.  Cardiovascular: Normal rate, regular rhythm and normal heart sounds. Exam reveals no gallop and no friction rub.  No murmur heard. Pulmonary/Chest: Effort normal and breath sounds normal. No respiratory distress. She has no wheezes. She has no rales. Right breast exhibits skin change (Superior fibrocystic changes). Left breast exhibits skin change (Moderate post-surgical and post-radiation changes. Arched incision. ).  Abdominal: Soft. Bowel sounds are normal. She exhibits no distension. There is no tenderness. A hernia (ventral) is present.  Musculoskeletal: Normal range  of motion. She exhibits no edema or tenderness.  Lymphadenopathy:    She has no cervical adenopathy.    She has no axillary adenopathy.       Right: No inguinal and no supraclavicular adenopathy present.       Left: No inguinal and no supraclavicular adenopathy present.  Neurological: She is alert and oriented to person, place, and time.  Skin: Skin is warm and dry. No rash noted. No erythema.  Psychiatric: Mood, affect and judgment normal.  Nursing note and vitals reviewed.   No visits with results within 3 Day(s) from this visit.  Latest known visit with results is:  Orders Only on 12/25/2017  Component Date Value Ref Range Status  . CA 27.29 12/25/2017 15.6  0.0 - 38.6 U/mL Final   Comment: (NOTE) Siemens Centaur Immunochemiluminometric Methodology [facility]) Values obtained with different assay methods or kits cannot be  used interchangeably. Results cannot be interpreted as absolute evidence of the presence or absence of malignant disease. Performed At: [facility] [place], [place] [id number] Rush Farmer MD PY:[id number]   . Sodium 12/25/2017 139  135 - 145 mmol/L Final  . Potassium 12/25/2017 3.7  3.5 - 5.1 mmol/L Final  . Chloride 12/25/2017 108  98 - 111 mmol/L Final  . CO2 12/25/2017 22  22 - 32 mmol/L Final  . Glucose, Bld 12/25/2017 88  70 - 99 mg/dL Final  . BUN 12/25/2017 10  6 - 20 mg/dL Final  . Creatinine, Ser 12/25/2017 0.75  0.44 - 1.00 mg/dL Final  . Calcium 12/25/2017 9.2  8.9 - 10.3 mg/dL Final  . Total Protein 12/25/2017 7.6  6.5 - 8.1 g/dL Final  . Albumin 12/25/2017 3.8  3.5 - 5.0 g/dL Final  . AST 12/25/2017 24  15 - 41 U/L Final  . ALT 12/25/2017 25  0 - 44 U/L Final  . Alkaline Phosphatase 12/25/2017 89  38 - 126 U/L Final  . Total Bilirubin 12/25/2017 0.3  0.3 - 1.2 mg/dL Final  . GFR calc non Af Amer 12/25/2017 >60  >60 mL/min Final  . GFR calc Af Amer 12/25/2017 >60  >60 mL/min Final   Comment: (NOTE) The eGFR has been calculated using the CKD EPI equation. This calculation has not been validated in all clinical situations. eGFR's persistently <60 mL/min signify possible Chronic Kidney Disease.   Georgiann Hahn gap 12/25/2017 9  5 - 15 Final   Performed at [facility], [address] [place], [place] [address]  . WBC 12/25/2017 7.8  3.6 - 11.0 K/uL Final  . RBC 12/25/2017 4.71  3.80 - 5.20 MIL/uL Final  . Hemoglobin 12/25/2017 12.8  12.0 - 16.0 g/dL Final  . HCT 12/25/2017 38.5  35.0 - 47.0 % Final  . MCV 12/25/2017 81.8  80.0 - 100.0 fL Final  . MCH 12/25/2017 27.3  26.0 - 34.0 pg Final  . MCHC 12/25/2017 33.3  32.0 - 36.0 g/dL Final  . RDW 12/25/2017 14.4  11.5 - 14.5 % Final  . Platelets 12/25/2017 228  150 - 440 K/uL Final  . Neutrophils Relative % 12/25/2017 54  % Final  . Neutro Abs 12/25/2017 4.2  1.4 - 6.5 K/uL Final  .  Lymphocytes Relative 12/25/2017 38  % Final  . Lymphs Abs 12/25/2017 3.0  1.0 - 3.6 K/uL Final  . Monocytes Relative 12/25/2017 5  % Final  . Monocytes Absolute 12/25/2017 0.4  0.2 - 0.9 K/uL Final  . Eosinophils Relative 12/25/2017 2  %  Final  . Eosinophils Absolute 12/25/2017 0.1  0 - 0.7 K/uL Final  . Basophils Relative 12/25/2017 1  % Final  . Basophils Absolute 12/25/2017 0.1  0 - 0.1 K/uL Final   Performed at [facility], [address], [place], [place] [address]    Assessment:  Donna Riley is a 58 y.o. African American female with stage IIA left breast cancer status post partial mastectomy and sentinel lymph node biopsy on 08/27/2014. 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 There was lymphovascular invasion. One of 2 sentinel lymph nodes were positive (focus of 2.8 mm). Tumor was > 90% ER positive, > 90% PR positive, and Her2/neu negative. Pathologic stage was [id number].  Bone scan on 09/16/2014 revealed abnormal focal uptake at the level of right L3 pedicle and L5 vertebral body. Lumbar spine MRI on 09/27/2014 revealed no evidence of metastatic disease with lower lumbar facet arthritis. Abdominal and pelvic CT scan on 09/24/2014 revealed hepatomegaly and no evidence of metastatic disease.   Bone density study on 09/15/2014 revealed osteopenia with a T-score of -2.1 at L1-L4.  She is on calcium and vitamin D.  Bone density on 01/17/2018 revealed osteoporosis with a T-score of -2.6 in the AP spine L1-L4.  She received 4 cycles of Taxotere and Cytoxan (09/29/2014 - 12/09/2014) with Neulasta support. She received 50.4 Gy to the left breast from 01/12/2015 until 02/23/2015.  She was started on Femara on 03/12/2015, but switched to Arimidex on 03/30/2015 secondary to diffuse joint aches.    CA27.29 has been followed: 16.9 on 09/09/2014, 12.1 on 06/02/2015, 15.6 on 08/07/2015, 17.4 on 11/23/2015, 13.6 on  02/22/2016, 18.9 on 06/20/2016, 18.4 on 10/21/2016, 15 on 02/23/2017, 14.1 on 06/26/2017, and 15.6 on 12/25/2017.  Bilateral diagnostic mammogram on 07/30/2015 revealed no evidence of malignancy.  Left sided mammogram and ultrasound on 02/29/2016 revealed a 1.5 x 0.6 x 0.7 cm intraductal mass.  Bilateral mammogram on 09/12/2016 revealed no focal fluid collection at the surgical site to account for the persistent drainage. There were no suspicious mammographic or targeted sonographic abnormalities at the lumpectomy site. There was no evidence of malignancy in the bilateral breasts.   Bilateral mammogram on 09/14/2017 that revealed maturing fat necrosis at the LEFT lumpectomy site. There was no evidence of malignancy in either breast.  Left diagnostic mammogram and ultrasound on 01/01/2018 revealed no significant abnormality other than a single normal appearing lymph node over the axillary tail portion of the left breast in the area of palpable abnormality and tenderness.  She had chronic left nipple discharge.  Left breast lumpectomy at the 11 o'clock position on 04/05/2016 revealed an intraductal papilloma with sclerosis and calcifications. There was fat necrosis with fibrosis and calcifications. Pathology was negative for atypia and malignancy  Colonoscopy on 08/28/2015 revealed one 4 mm polyp in the rectum (tubular adenoma).  Symptomatically, patient is doing well overall. She denies any acute concerns. Patient denies B symptoms. No recurrent infections. Exam reveals fibrocystic changes in the RIGHT breast.   Plan: 1. Discuss interval labs. 2. Left breast cancer:  Discuss interval mammogram and ultrasound- normal appearing lymph node.  No evidence of recurrent disease.   Continue Arimidex. 3. Osteoporosis:  Discuss interval bone density- progressive bone thinning.  Continue calcium + vitamin D.  Discuss Prolia.    4. Discuss breast cancer. Doing well with no side effects on endocrine therapy  (Arimidex).  Continue as previously prescribed.  5.  Schedule bone density (overdue). 6. Discuss the use of Prolia to prevent bone thinning/loss associated with endocrine therapy. Side effects of this medication reviewed. Patient provided with printed information on Prolia on today's AVS. Patient encouraged to take calcium 1200 mg and vitamin D 800 IU daily. Discussed the need for dental clearance prior to beginning Prolia, as this medication is associated with an increased risk of dental complications such as osteonecrosis of the jaw.  7. Preauthorize Prolia.  8. Re: RIGHT axillary tenderness. Will send patient for ultrasound to assess fullness and tenderness.  9. RTC for MD assessment and review of imaging (bone density and ultrasound).   Lequita Asal, MD  01/18/18, 5:27 AM   I saw and evaluated the patient, participating in the key portions of the service and reviewing pertinent diagnostic studies and records.  I reviewed the nurse practitioner's note and agree with the findings and the plan.  The assessment and plan were discussed with the patient.  Additional diagnostic studies of *** are needed to clarify *** and would change the clinical management.  A few ***multiple questions were asked by the patient and answered.   Nolon Stalls, MD 01/18/2018,5:27 AM

## 2018-01-23 ENCOUNTER — Encounter: Payer: Self-pay | Admitting: Hematology and Oncology

## 2018-01-23 ENCOUNTER — Inpatient Hospital Stay: Payer: Medicare HMO | Attending: Hematology and Oncology | Admitting: Hematology and Oncology

## 2018-01-23 ENCOUNTER — Other Ambulatory Visit: Payer: Self-pay

## 2018-01-23 VITALS — BP 174/96 | HR 90 | Temp 96.0°F | Resp 18 | Wt 213.2 lb

## 2018-01-23 DIAGNOSIS — N644 Mastodynia: Secondary | ICD-10-CM | POA: Diagnosis not present

## 2018-01-23 DIAGNOSIS — C50612 Malignant neoplasm of axillary tail of left female breast: Secondary | ICD-10-CM | POA: Insufficient documentation

## 2018-01-23 DIAGNOSIS — Z79811 Long term (current) use of aromatase inhibitors: Secondary | ICD-10-CM | POA: Insufficient documentation

## 2018-01-23 DIAGNOSIS — Z170 Estrogen receptor positive status [ER+]: Secondary | ICD-10-CM | POA: Diagnosis not present

## 2018-01-23 DIAGNOSIS — C50912 Malignant neoplasm of unspecified site of left female breast: Secondary | ICD-10-CM

## 2018-01-23 DIAGNOSIS — M810 Age-related osteoporosis without current pathological fracture: Secondary | ICD-10-CM | POA: Insufficient documentation

## 2018-01-23 NOTE — Progress Notes (Signed)
Here for follow up. Stated overall doing well. See pain eval- stated left breast sharp burning sudden pains " where they took out lymph nodes " per pt.

## 2018-01-23 NOTE — Patient Instructions (Signed)
Call [facility] for dental services. [phone].  They do not open until 4:15pm. Call and see if they can help with your dental needs.  Honor Loh, MSN, APRN, FNP-C, CEN Oncology/Hematology Nurse Practitioner  [facility] 01/23/18, 10:32 AM

## 2018-01-23 NOTE — Progress Notes (Signed)
[facility] day:  01/23/18  Chief Complaint: Donna Riley is a 57 y.o. female with stage IIA left breast cancer who is seen for review of interval imaging and discussion regarding direction of therapy.  HPI: The patient was last seen in the medical oncology clinic on 12/25/2017.  At that time, she was doing well overall. She denied any acute concerns. Patient denied any B symptoms. No recurrent infections. She noted a palpable abnormality and tenderness in the left axilla x 1-2 weeks.  Exam revealed fibrocystic changes in the RIGHT breast.   Left diagnostic mammogram and ultrasound on 01/01/2018 revealed no significant abnormality other than a single normal appearing lymph node over the axillary tail portion of the left breast in the area of palpable abnormality and tenderness.  Bone density on 01/17/2018 revealed osteoporosis with a T-score of -2.6 in the AP spine L1-L4.  During the interim, she notes intermittent shooting pain in the left breast where "lymph nodes were removed".   She notes that she hasn't been to the dentist. She notes left ear discomfort.   Past Medical History:  Diagnosis Date  . Arthritis    shoulders and neck  . Breast cancer ([place]) 2016    Left breast with chemo + rad tx's with lumpectomy.  . Diabetes mellitus without complication ([place]) 10/1948  . Elevated LFTs   . Fatigue 08/01/2016  . Hydradenitis 01/23/2015  . Hypertension   . Mastitis 10/15/2016  . Personal history of chemotherapy   . Personal history of radiation therapy   . Positive PPD, treated   . Shortness of breath dyspnea    with exertion    Past Surgical History:  Procedure Laterality Date  . BREAST BIOPSY Left 07/31/2014   positive. Lumpectomy 08/27/2014 with chemo and rad  . BREAST BIOPSY Left 03/08/2016   INTRADUCTAL PAPILLOMA WITH SCLEROSIS AND CALCIFICATIONS  . BREAST BIOPSY Left 03/08/2016   FAT NECROSIS WITH FIBROSIS  . BREAST LUMPECTOMY  WITH NEEDLE LOCALIZATION Left 04/05/2016   Procedure: BREAST LUMPECTOMY WITH NEEDLE LOCALIZATION;  Surgeon: Hubbard Robinson, MD;  Location: [facility];  Service: General;  Laterality: Left;  . BREAST LUMPECTOMY WITH NEEDLE LOCALIZATION AND AXILLARY SENTINEL LYMPH NODE BX Left 08/27/14  . CHOLECYSTECTOMY  09/07/2013  . COLONOSCOPY WITH PROPOFOL N/A 08/28/2015   Procedure: COLONOSCOPY WITH PROPOFOL;  Surgeon: Lucilla Lame, MD;  Location: [place];  Service: Endoscopy;  Laterality: N/A;  Diabetic oral  . ESOPHAGOGASTRODUODENOSCOPY  2014   gastritis  . POLYPECTOMY  08/28/2015   Procedure: POLYPECTOMY INTESTINAL;  Surgeon: Lucilla Lame, MD;  Location: [facility];  Service: Endoscopy;;  Rectal polyp  . TUBAL LIGATION Bilateral   . UMBILICAL HERNIA REPAIR N/A 08/09/2016   Procedure: HERNIA REPAIR UMBILICAL ADULT;  Surgeon: Olean Ree, MD;  Location: [facility];  Service: General;  Laterality: N/A;  . VENTRAL HERNIA REPAIR N/A 04/10/2015   Procedure: HERNIA REPAIR VENTRAL ADULT;  Surgeon: Leonie Green, MD;  Location: [facility];  Service: General;  Laterality: N/A;    Family History  Problem Relation Age of Onset  . Cancer Mother        Pancreatic  . Cancer Father        Throat  . Healthy Sister   . HIV Brother   . Cancer Maternal Grandmother        Breast  . Breast cancer Maternal Grandmother 75    Social History:  reports that she has been smoking  cigarettes. She has a 19.00 pack-year smoking history. She has never used smokeless tobacco. She reports that she drinks about 2.0 standard drinks of alcohol per week. She reports that she does not use drugs.  She lives in [place].  The patient is alone today.  Allergies: No Known Allergies  Current Medications: Current Outpatient Medications  Medication Sig Dispense Refill  . amLODipine (NORVASC) 10 MG tablet Take 1 tablet (10 mg total) by mouth daily. 90 tablet 0  . anastrozole (ARIMIDEX) 1 MG tablet Take 1 tablet (1 mg  total) by mouth daily. 90 tablet 0  . aspirin EC 81 MG tablet Take 81 mg by mouth daily.    Marland Kitchen lisinopril (PRINIVIL,ZESTRIL) 40 MG tablet Take 1 tablet (40 mg total) by mouth daily. 90 tablet 0  . metFORMIN (GLUCOPHAGE) 500 MG tablet Take 1 tablet (500 mg total) by mouth every morning. 90 tablet 0  . triamterene-hydrochlorothiazide (MAXZIDE-25) 37.5-25 MG tablet Take 1 tablet by mouth daily. 90 tablet 3  . acetaminophen (TYLENOL) 325 MG tablet Take 650 mg by mouth daily as needed.    . Alcohol Swabs (B-D SINGLE USE SWABS REGULAR) PADS 1 each by Does not apply route 2 (two) times daily. 100 each 3  . atorvastatin (LIPITOR) 10 MG tablet Take 1 tablet (10 mg total) by mouth daily. 90 tablet 1  . Blood Glucose Calibration (TRUE METRIX LEVEL 1) Low SOLN 1 each by In Vitro route daily as needed. 1 each 3  . Blood Glucose Monitoring Suppl (TRUE METRIX METER) DEVI 1 each by Does not apply route daily. 100 Device 3  . colchicine 0.6 MG tablet Take 1 tablet (0.6 mg total) by mouth 2 (two) times daily. Up to 5 days as needed 30 tablet 1  . glucose blood test strip Use as instructed 100 each 12  . LANCETS MICRO THIN 33G MISC 1 each by Does not apply route daily. 100 each 3   No current facility-administered medications for this visit.     Review of Systems  Constitutional: Negative.  Negative for chills, diaphoresis, fever, malaise/fatigue and weight loss.  HENT: Positive for ear pain (left). Negative for congestion, ear discharge, hearing loss, nosebleeds, sinus pain, sore throat and tinnitus.   Eyes: Negative.  Negative for blurred vision, double vision, photophobia, pain, discharge and redness.  Respiratory: Negative.  Negative for cough, hemoptysis, sputum production and shortness of breath.   Cardiovascular: Negative.  Negative for chest pain, orthopnea, leg swelling and PND.  Gastrointestinal: Negative.  Negative for abdominal pain, blood in stool, constipation, diarrhea, melena and nausea.   Genitourinary: Negative.  Negative for dysuria, frequency, hematuria and urgency.  Musculoskeletal: Negative.  Negative for back pain, falls, joint pain, myalgias and neck pain.  Skin: Negative.  Negative for itching and rash.  Neurological: Negative.  Negative for dizziness, tingling, tremors, sensory change, speech change, focal weakness, weakness and headaches.  Endo/Heme/Allergies: Negative.  Does not bruise/bleed easily.  Psychiatric/Behavioral: Negative for depression. The patient is not nervous/anxious and does not have insomnia.   All other systems reviewed and are negative.  Performance status (ECOG): 1 - Symptomatic but completely ambulatory  Vital Signs BP (!) 174/96 (BP Location: Left Arm, Patient Position: Sitting)   Pulse 90   Temp (!) 96 F (35.6 C) (Tympanic)   Resp 18   Wt 213 lb 3.2 oz (96.7 kg)   BMI 33.39 kg/m   Physical Exam  Constitutional: She is oriented to person, place, and time and well-developed, well-nourished, and  in no distress. No distress.  HENT:  Head: Normocephalic and atraumatic.  Right Ear: External ear normal.  Left Ear: External ear normal.  Mouth/Throat: Oropharynx is clear and moist. No oropharyngeal exudate.  Brown hair with long braids.  Eyes: Conjunctivae and EOM are normal. No scleral icterus.  Brown eyes  Neck: Normal range of motion. Neck supple. No JVD present.  Lymphadenopathy:    She has no cervical adenopathy.    She has no axillary adenopathy.       Right: No supraclavicular adenopathy present.       Left: No supraclavicular adenopathy present.  Neurological: She is alert and oriented to person, place, and time. Gait normal.  Skin: Skin is warm and dry. She is not diaphoretic.  Psychiatric: Mood, affect and judgment normal.  Nursing note and vitals reviewed.   No visits with results within 3 Day(s) from this visit.  Latest known visit with results is:  Orders Only on 12/25/2017  Component Date Value Ref Range Status   . CA 27.29 12/25/2017 15.6  0.0 - 38.6 U/mL Final   Comment: (NOTE) Siemens Centaur Immunochemiluminometric Methodology [facility]) Values obtained with different assay methods or kits cannot be used interchangeably. Results cannot be interpreted as absolute evidence of the presence or absence of malignant disease. Performed At: [facility] [place], [place] [id number] Rush Farmer MD WJ:[id number]   . Sodium 12/25/2017 139  135 - 145 mmol/L Final  . Potassium 12/25/2017 3.7  3.5 - 5.1 mmol/L Final  . Chloride 12/25/2017 108  98 - 111 mmol/L Final  . CO2 12/25/2017 22  22 - 32 mmol/L Final  . Glucose, Bld 12/25/2017 88  70 - 99 mg/dL Final  . BUN 12/25/2017 10  6 - 20 mg/dL Final  . Creatinine, Ser 12/25/2017 0.75  0.44 - 1.00 mg/dL Final  . Calcium 12/25/2017 9.2  8.9 - 10.3 mg/dL Final  . Total Protein 12/25/2017 7.6  6.5 - 8.1 g/dL Final  . Albumin 12/25/2017 3.8  3.5 - 5.0 g/dL Final  . AST 12/25/2017 24  15 - 41 U/L Final  . ALT 12/25/2017 25  0 - 44 U/L Final  . Alkaline Phosphatase 12/25/2017 89  38 - 126 U/L Final  . Total Bilirubin 12/25/2017 0.3  0.3 - 1.2 mg/dL Final  . GFR calc non Af Amer 12/25/2017 >60  >60 mL/min Final  . GFR calc Af Amer 12/25/2017 >60  >60 mL/min Final   Comment: (NOTE) The eGFR has been calculated using the CKD EPI equation. This calculation has not been validated in all clinical situations. eGFR's persistently <60 mL/min signify possible Chronic Kidney Disease.   Georgiann Hahn gap 12/25/2017 9  5 - 15 Final   Performed at [facility], [address] [place], [place] [address]  . WBC 12/25/2017 7.8  3.6 - 11.0 K/uL Final  . RBC 12/25/2017 4.71  3.80 - 5.20 MIL/uL Final  . Hemoglobin 12/25/2017 12.8  12.0 - 16.0 g/dL Final  . HCT 12/25/2017 38.5  35.0 - 47.0 % Final  . MCV 12/25/2017 81.8  80.0 - 100.0 fL Final  . MCH 12/25/2017 27.3  26.0 - 34.0 pg Final  . MCHC 12/25/2017 33.3  32.0 - 36.0 g/dL Final  . RDW  12/25/2017 14.4  11.5 - 14.5 % Final  . Platelets 12/25/2017 228  150 - 440 K/uL Final  . Neutrophils Relative % 12/25/2017 54  % Final  . Neutro Abs 12/25/2017 4.2  1.4 -  6.5 K/uL Final  . Lymphocytes Relative 12/25/2017 38  % Final  . Lymphs Abs 12/25/2017 3.0  1.0 - 3.6 K/uL Final  . Monocytes Relative 12/25/2017 5  % Final  . Monocytes Absolute 12/25/2017 0.4  0.2 - 0.9 K/uL Final  . Eosinophils Relative 12/25/2017 2  % Final  . Eosinophils Absolute 12/25/2017 0.1  0 - 0.7 K/uL Final  . Basophils Relative 12/25/2017 1  % Final  . Basophils Absolute 12/25/2017 0.1  0 - 0.1 K/uL Final   Performed at [facility], [address], [place] [address]    Assessment:  MINH JASPER is a 57 y.o. African American female with stage IIA left breast cancer status post partial mastectomy and sentinel lymph node biopsy on 08/27/2014. 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 There was lymphovascular invasion. One of 2 sentinel lymph nodes were positive (focus of 2.8 mm). Tumor was > 90% ER positive, > 90% PR positive, and Her2/neu negative. Pathologic stage was [id number].  Bone scan on 09/16/2014 revealed abnormal focal uptake at the level of right L3 pedicle and L5 vertebral body. Lumbar spine MRI on 09/27/2014 revealed no evidence of metastatic disease with lower lumbar facet arthritis. Abdominal and pelvic CT scan on 09/24/2014 revealed hepatomegaly and no evidence of metastatic disease.   She received 4 cycles of Taxotere and Cytoxan (09/29/2014 - 12/09/2014) with Neulasta support. She received 50.4 Gy to the left breast from 01/12/2015 until 02/23/2015.  She was started on Femara on 03/12/2015, but switched to Arimidex on 03/30/2015 secondary to diffuse joint aches.    CA27.29 has been followed: 16.9 on 09/09/2014, 12.1 on 06/02/2015, 15.6 on 08/07/2015, 17.4 on 11/23/2015, 13.6 on 02/22/2016, 18.9 on  06/20/2016, 18.4 on 10/21/2016, 15 on 02/23/2017, 14.1 on 06/26/2017, and 15.6 on 12/25/2017.  Bilateral diagnostic mammogram on 07/30/2015 revealed no evidence of malignancy.  Left sided mammogram and ultrasound on 02/29/2016 revealed a 1.5 x 0.6 x 0.7 cm intraductal mass.  Bilateral mammogram on 09/12/2016 revealed no focal fluid collection at the surgical site to account for the persistent drainage. There were no suspicious mammographic or targeted sonographic abnormalities at the lumpectomy site. There was no evidence of malignancy in the bilateral breasts.   Bilateral mammogram on 09/14/2017 that revealed maturing fat necrosis at the LEFT lumpectomy site. There was no evidence of malignancy in either breast.  Left diagnostic mammogram and ultrasound on 01/01/2018 revealed no significant abnormality other than a single normal appearing lymph node over the axillary tail portion of the left breast in the area of palpable abnormality and tenderness.  She had chronic left nipple discharge.  Left breast lumpectomy at the 11 o'clock position on 04/05/2016 revealed an intraductal papilloma with sclerosis and calcifications. There was fat necrosis with fibrosis and calcifications. Pathology was negative for atypia and malignancy  Colonoscopy on 08/28/2015 revealed one 4 mm polyp in the rectum (tubular adenoma).  Bone density study on 09/15/2014 revealed osteopenia with a T-score of -2.1 at L1-L4.  Bone density on 01/17/2018 revealed osteoporosis with a T-score of -2.6 in the AP spine L1-L4.  She is on calcium and vitamin D.  Symptomatically, she notes intermittent left breast and axillary discomfort.  Exam reveals fibrocystic changes in the RIGHT breast.   Plan: 1.   Discuss interval labs.  CBC, CMP, and CA27.29 normal. 2.   Stage IIA left breast cancer:  Discuss interval mammogram and ultrasound- normal  appearing lymph node.  No evidence of recurrent disease.   Continue Arimidex. 3.    Osteoporosis:  Discuss interval bone density- progressive bone thinning.  Continue calcium + vitamin D.  Discuss Prolia.   Follow-up regarding dental clearance.  Information provided for [facility] services [phone]). 4.  RTC in 6 months for MD assessment and labs (CBC with diff, CMP, CA27.29).   Lequita Asal, MD  01/23/2018, 11:32 AM

## 2018-04-11 ENCOUNTER — Ambulatory Visit (INDEPENDENT_AMBULATORY_CARE_PROVIDER_SITE_OTHER): Payer: Medicare HMO | Admitting: Internal Medicine

## 2018-04-11 ENCOUNTER — Encounter: Payer: Self-pay | Admitting: Internal Medicine

## 2018-04-11 ENCOUNTER — Other Ambulatory Visit
Admission: RE | Admit: 2018-04-11 | Discharge: 2018-04-11 | Disposition: A | Discharge status: Home / Self Care | Payer: Medicare HMO | Source: Ambulatory Visit | Attending: Internal Medicine | Admitting: Internal Medicine

## 2018-04-11 VITALS — BP 140/74 | HR 77 | Ht 67.0 in | Wt 213.0 lb

## 2018-04-11 DIAGNOSIS — E119 Type 2 diabetes mellitus without complications: Secondary | ICD-10-CM | POA: Insufficient documentation

## 2018-04-11 DIAGNOSIS — K3184 Gastroparesis: Secondary | ICD-10-CM | POA: Insufficient documentation

## 2018-04-11 DIAGNOSIS — E1169 Type 2 diabetes mellitus with other specified complication: Secondary | ICD-10-CM | POA: Diagnosis not present

## 2018-04-11 DIAGNOSIS — I10 Essential (primary) hypertension: Secondary | ICD-10-CM | POA: Insufficient documentation

## 2018-04-11 DIAGNOSIS — E785 Hyperlipidemia, unspecified: Secondary | ICD-10-CM | POA: Insufficient documentation

## 2018-04-11 LAB — COMPREHENSIVE METABOLIC PANEL
ALT: 25 U/L (ref 0–44)
AST: 21 U/L (ref 15–41)
Albumin: 4.2 g/dL (ref 3.5–5.0)
Alkaline Phosphatase: 95 U/L (ref 38–126)
Anion gap: 11 (ref 5–15)
BUN: 12 mg/dL (ref 6–20)
CO2: 25 mmol/L (ref 22–32)
Calcium: 9.2 mg/dL (ref 8.9–10.3)
Chloride: 103 mmol/L (ref 98–111)
Creatinine, Ser: 0.94 mg/dL (ref 0.44–1.00)
GFR calc Af Amer: >60 mL/min (ref >60)
GFR calc non Af Amer: >60 mL/min (ref >60)
Glucose, Bld: 110 mg/dL — ABNORMAL HIGH (ref 70–99)
Potassium: 4.2 mmol/L (ref 3.5–5.1)
Sodium: 139 mmol/L (ref 135–145)
Total Bilirubin: 0.2 mg/dL — ABNORMAL LOW (ref 0.3–1.2)
Total Protein: 7.9 g/dL (ref 6.5–8.1)

## 2018-04-11 LAB — LIPID PANEL
Cholesterol: 176 mg/dL (ref 0–200)
HDL: 36 mg/dL — ABNORMAL LOW (ref >40)
LDL Cholesterol: 86 mg/dL (ref 0–99)
Total CHOL/HDL Ratio: 4.9 RATIO
Triglycerides: 271 mg/dL — ABNORMAL HIGH (ref <150)
VLDL: 54 mg/dL — ABNORMAL HIGH (ref 0–40)

## 2018-04-11 LAB — TSH: TSH: 1.765 u[IU]/mL (ref 0.350–4.500)

## 2018-04-11 LAB — HEMOGLOBIN A1C
Hgb A1c MFr Bld: 6.2 % — ABNORMAL HIGH (ref 4.8–5.6)
Mean Plasma Glucose: 131.24 mg/dL

## 2018-04-11 NOTE — Progress Notes (Signed)
Date:  04/11/2018   Name:  Donna Riley   DOB:  Nov 05, 1960   MRN:  [id number]   Chief Complaint: Diabetes (Fasting for labs. Wants to go to downstairs lab.) and Hyperlipidemia  Diabetes  She presents for her follow-up diabetic visit. She has type 2 diabetes mellitus. Her disease course has been stable. Pertinent negatives for hypoglycemia include no headaches or tremors. Pertinent negatives for diabetes include no chest pain, no fatigue, no polydipsia and no polyuria. Symptoms are stable. Current diabetic treatment includes oral agent (monotherapy). Her weight is stable. Her breakfast blood glucose is taken between 6-7 am. Her breakfast blood glucose range is generally 90-110 mg/dl. An ACE inhibitor/angiotensin II receptor blocker is being taken.  Hyperlipidemia  This is a chronic problem. The problem is uncontrolled. Pertinent negatives include no chest pain or shortness of breath.  Hypertension  This is a chronic problem. The problem is controlled. Pertinent negatives include no chest pain, headaches, palpitations or shortness of breath. Past treatments include diuretics, ACE inhibitors and calcium channel blockers. The current treatment provides significant improvement.  Abdominal Pain  This is a new problem. The current episode started more than 1 month ago. The problem occurs every several days. The problem has been unchanged. The pain is located in the generalized abdominal region and LUQ. The pain is mild. The quality of the pain is a sensation of fullness (and feels like digestion is slow). Pertinent negatives include no arthralgias, belching, constipation, dysuria, fever, headaches or hematuria. The pain is aggravated by eating.  CT abdomen 01/2016: Incomplete gastric distention, unable to exclude gastric wall thickening proximally. Colonoscopy was done in 2017 - normal except for one rectal polyp.  Lab Results  Component Value Date   HGBA1C 6.6 (H) 12/15/2017   Lab Results    Component Value Date   CREATININE 0.75 12/25/2017   BUN 10 12/25/2017   NA 139 12/25/2017   K 3.7 12/25/2017   CL 108 12/25/2017   CO2 22 12/25/2017     Review of Systems  Constitutional: Negative for appetite change, fatigue, fever and unexpected weight change.  HENT: Negative for tinnitus and trouble swallowing.   Eyes: Negative for visual disturbance.  Respiratory: Negative for cough, chest tightness and shortness of breath.   Cardiovascular: Negative for chest pain, palpitations and leg swelling.  Gastrointestinal: Negative for abdominal pain and constipation.       Stomach fullness  Endocrine: Negative for polydipsia and polyuria.  Genitourinary: Negative for dysuria and hematuria.  Musculoskeletal: Negative for arthralgias.  Neurological: Negative for tremors, numbness and headaches.  Psychiatric/Behavioral: Negative for dysphoric mood.    Patient Active Problem List   Diagnosis Date Noted  . Hyperlipidemia associated with type 2 diabetes mellitus ([place]) 04/11/2018  . Long term (current) use of aromatase inhibitors 12/25/2017  . Breast pain, left 10/14/2016  . Arthritis   . Elevated LFTs   . Personal history of chemotherapy   . Positive PPD, treated   . Personal history of radiation therapy   . Umbilical hernia without obstruction and without gangrene 07/13/2016  . Breast wound, left, sequela 06/23/2016  . Rectal polyp   . Irritable bowel syndrome with both constipation and diarrhea 07/20/2015  . Controlled type 2 diabetes mellitus without complication, without long-term current use of insulin ([place]) 06/22/2015  . Pre-ulcerative calluses 06/22/2015  . Neuropathy 06/22/2015  . Hot flash, menopausal 04/09/2015  . Essential (primary) hypertension 03/25/2015  . Osteoporosis 03/12/2015  . History of left breast cancer  05/30/2014  . 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 BREAST BIOPSY Left  07/31/2014   positive. Lumpectomy 08/27/2014 with chemo and rad  . BREAST BIOPSY Left 03/08/2016   INTRADUCTAL PAPILLOMA WITH SCLEROSIS AND CALCIFICATIONS  . BREAST BIOPSY Left 03/08/2016   FAT NECROSIS WITH FIBROSIS  . BREAST LUMPECTOMY WITH NEEDLE LOCALIZATION Left 04/05/2016   Procedure: BREAST LUMPECTOMY WITH NEEDLE LOCALIZATION;  Surgeon: Hubbard Robinson, MD;  Location: [facility];  Service: General;  Laterality: Left;  . BREAST LUMPECTOMY WITH NEEDLE LOCALIZATION AND AXILLARY SENTINEL LYMPH NODE BX Left 08/27/14  . CHOLECYSTECTOMY  09/07/2013  . COLONOSCOPY WITH PROPOFOL N/A 08/28/2015   Procedure: COLONOSCOPY WITH PROPOFOL;  Surgeon: Lucilla Lame, MD;  Location: [place];  Service: Endoscopy;  Laterality: N/A;  Diabetic oral  . ESOPHAGOGASTRODUODENOSCOPY  2014   gastritis  . POLYPECTOMY  08/28/2015   Procedure: POLYPECTOMY INTESTINAL;  Surgeon: Lucilla Lame, MD;  Location: [place];  Service: Endoscopy;;  Rectal polyp  . TUBAL LIGATION Bilateral   . UMBILICAL HERNIA REPAIR N/A 08/09/2016   Procedure: HERNIA REPAIR UMBILICAL ADULT;  Surgeon: Olean Ree, MD;  Location: [facility];  Service: General;  Laterality: N/A;  . VENTRAL HERNIA REPAIR N/A 04/10/2015   Procedure: HERNIA REPAIR VENTRAL ADULT;  Surgeon: Leonie Green, MD;  Location: [facility];  Service: General;  Laterality: N/A;    Social History   Tobacco Use  . Smoking status: Current Every Day Smoker    Packs/day: 0.50    Years: 38.00    Pack years: 19.00    Types: Cigarettes  . Smokeless tobacco: Never Used  . Tobacco comment: Smoke 8 cigerattes a day.   Substance Use Topics  . Alcohol use: Yes    Alcohol/week: 2.0 standard drinks    Types: 2 Cans of beer per week    Comment: Occasionally   . Drug use: No     Medication list has been reviewed and updated.  Current Meds  Medication Sig  . acetaminophen (TYLENOL) 325 MG tablet Take 650 mg by mouth daily as needed.  . Alcohol Swabs (B-D  SINGLE USE SWABS REGULAR) PADS 1 each by Does not apply route 2 (two) times daily.  Marland Kitchen amLODipine (NORVASC) 10 MG tablet Take 1 tablet (10 mg total) by mouth daily.  Marland Kitchen anastrozole (ARIMIDEX) 1 MG tablet Take 1 tablet (1 mg total) by mouth daily.  Marland Kitchen aspirin EC 81 MG tablet Take 81 mg by mouth daily.  . Blood Glucose Calibration (TRUE METRIX LEVEL 1) Low SOLN 1 each by In Vitro route daily as needed.  . Blood Glucose Monitoring Suppl (TRUE METRIX METER) DEVI 1 each by Does not apply route daily.  . colchicine 0.6 MG tablet Take 1 tablet (0.6 mg total) by mouth 2 (two) times daily. Up to 5 days as needed  . glucose blood test strip Use as instructed  . LANCETS MICRO THIN 33G MISC 1 each by Does not apply route daily.  Marland Kitchen lisinopril (PRINIVIL,ZESTRIL) 40 MG tablet Take 1 tablet (40 mg total) by mouth daily.  . metFORMIN (GLUCOPHAGE) 500 MG tablet Take 1 tablet (500 mg total) by mouth every morning.  . triamterene-hydrochlorothiazide (MAXZIDE-25) 37.5-25 MG tablet Take 1 tablet by mouth daily.    PHQ 2/9 Scores 11/08/2017 11/03/2017 05/18/2017 04/18/2016  PHQ - 2 Score 0 0 0 0  PHQ- 9 Score - 3 - -  Physical Exam  Constitutional: She is oriented to person, place, and time. She appears well-developed. No distress.  HENT:  Head: Normocephalic and atraumatic.  Neck: Normal range of motion. Neck supple.  Cardiovascular: Normal rate, regular rhythm and normal heart sounds.  Pulmonary/Chest: Effort normal and breath sounds normal. No respiratory distress.  Abdominal: Soft. Normal appearance and bowel sounds are normal. There is tenderness in the left upper quadrant. There is no rigidity, no rebound, no guarding and no CVA tenderness. No hernia.  Musculoskeletal: Normal range of motion.  Lymphadenopathy:    She has no cervical adenopathy.  Neurological: She is alert and oriented to person, place, and time.  Skin: Skin is warm and dry. No rash noted.  Psychiatric: She has a normal mood and affect.  Her behavior is normal. Thought content normal.  Nursing note and vitals reviewed.   BP 140/74 (BP Location: Right Arm, Patient Position: Sitting, Cuff Size: Large)   Pulse 77   Ht 5\' 7"  (1.702 m)   Wt 213 lb (96.6 kg)   SpO2 98%   BMI 33.36 kg/m   Assessment and Plan: 1. Essential (primary) hypertension controlled - TSH  2. Controlled type 2 diabetes mellitus without complication, without long-term current use of insulin (H[facility]) Stable,continue current therapy - Hemoglobin A1c - Comprehensive metabolic panel  3. Hyperlipidemia associated with type 2 diabetes mellitus ([place]) Check lab and then advise on statin therapy - Lipid panel  4. Gastroparesis See CT abdomen 2017 for area of concern Suspect gastroparesis - Ambulatory referral to Gastroenterology   Partially dictated using Dragon software. Any errors are unintentional.  Halina Maidens, MD [facility]  04/11/2018

## 2018-04-12 ENCOUNTER — Other Ambulatory Visit: Payer: Self-pay | Admitting: Internal Medicine

## 2018-04-12 DIAGNOSIS — E785 Hyperlipidemia, unspecified: Principal | ICD-10-CM

## 2018-04-12 DIAGNOSIS — E1169 Type 2 diabetes mellitus with other specified complication: Secondary | ICD-10-CM

## 2018-04-12 MED ORDER — ATORVASTATIN CALCIUM 10 MG PO TABS: 10.0000 mg | ORAL_TABLET | Freq: Every day | ORAL | 1 refills | Status: DC

## 2018-05-11 DIAGNOSIS — M47817 Spondylosis without myelopathy or radiculopathy, lumbosacral region: Secondary | ICD-10-CM | POA: Diagnosis not present

## 2018-05-13 DIAGNOSIS — Z853 Personal history of malignant neoplasm of breast: Secondary | ICD-10-CM | POA: Diagnosis not present

## 2018-05-13 DIAGNOSIS — M62830 Muscle spasm of back: Secondary | ICD-10-CM | POA: Diagnosis not present

## 2018-05-13 DIAGNOSIS — Z8542 Personal history of malignant neoplasm of other parts of uterus: Secondary | ICD-10-CM | POA: Diagnosis not present

## 2018-05-13 DIAGNOSIS — F17200 Nicotine dependence, unspecified, uncomplicated: Secondary | ICD-10-CM | POA: Diagnosis not present

## 2018-05-13 DIAGNOSIS — Z9221 Personal history of antineoplastic chemotherapy: Secondary | ICD-10-CM | POA: Diagnosis not present

## 2018-05-13 DIAGNOSIS — M545 Low back pain: Secondary | ICD-10-CM | POA: Diagnosis not present

## 2018-05-13 DIAGNOSIS — I10 Essential (primary) hypertension: Secondary | ICD-10-CM | POA: Diagnosis not present

## 2018-05-13 DIAGNOSIS — Z9049 Acquired absence of other specified parts of digestive tract: Secondary | ICD-10-CM | POA: Diagnosis not present

## 2018-05-13 DIAGNOSIS — E119 Type 2 diabetes mellitus without complications: Secondary | ICD-10-CM | POA: Diagnosis not present

## 2018-06-11 ENCOUNTER — Ambulatory Visit: Payer: Medicare HMO | Admitting: Gastroenterology

## 2018-06-11 ENCOUNTER — Encounter: Payer: Self-pay | Admitting: Gastroenterology

## 2018-06-11 VITALS — BP 149/85 | HR 85 | Ht 67.0 in | Wt 213.8 lb

## 2018-06-11 DIAGNOSIS — R110 Nausea: Secondary | ICD-10-CM | POA: Diagnosis not present

## 2018-06-11 DIAGNOSIS — R140 Abdominal distension (gaseous): Secondary | ICD-10-CM

## 2018-06-11 NOTE — Progress Notes (Signed)
Donna Riley [address]  [place]  [place], [place] [address]  Main: [phone]  Fax: [phone]   Gastroenterology Consultation  Referring Provider:     Glean Hess, MD Primary Care Physician:  Glean Hess, MD Reason for Consultation:    Abdominal bloating        HPI:    Chief Complaint  Patient presents with  . New Patient (Initial Visit)    referred by Dr. Army Melia, MD- SENSATION OF INCOMPLETE STOMACH EMPTYING    Donna Riley is a 58 y.o. y/o female referred for consultation & management  by Dr. Army Melia, Jesse Sans, MD.  Patient reports chronic history of abdominal bloating.  States after eating she feels uncomfortable and when she is laying down to go to bed, she has to toss and turn this her abdomen feels too full.  Also reports intermittent nausea once or twice a week.  No emesis.  No prior EGDs.  No dysphagia or heartburn.  No family history of colon cancer.  Reports regular bowel movements every day or every other day after drinking coffee and states they are soft and does not have to strain.  No blood in stool.  No weight loss.  Screening colonoscopy March 2017 with Dr. Allen Norris, with one 4 mm tubular adenoma polyp removed.  Internal hemorrhoids noted.  Repeat recommended in 5 years.  Past Medical History:  Diagnosis Date  . Arthritis    shoulders and neck  . Breast cancer ([facility]) 2016    Left breast with chemo + rad tx's with lumpectomy.  . Diabetes mellitus without complication ([facility]) 11/4079  . Elevated LFTs   . Fatigue 08/01/2016  . Hydradenitis 01/23/2015  . Hypertension   . Mastitis 10/15/2016  . Personal history of chemotherapy   . Personal history of radiation therapy   . Positive PPD, treated   . Shortness of breath dyspnea    with exertion    Past Surgical History:  Procedure Laterality Date  . BREAST BIOPSY Left 07/31/2014   positive. Lumpectomy 08/27/2014 with chemo and rad  . BREAST BIOPSY Left 03/08/2016   INTRADUCTAL  PAPILLOMA WITH SCLEROSIS AND CALCIFICATIONS  . BREAST BIOPSY Left 03/08/2016   FAT NECROSIS WITH FIBROSIS  . BREAST LUMPECTOMY WITH NEEDLE LOCALIZATION Left 04/05/2016   Procedure: BREAST LUMPECTOMY WITH NEEDLE LOCALIZATION;  Surgeon: Hubbard Robinson, MD;  Location: [facility];  Service: General;  Laterality: Left;  . BREAST LUMPECTOMY WITH NEEDLE LOCALIZATION AND AXILLARY SENTINEL LYMPH NODE BX Left 08/27/14  . CHOLECYSTECTOMY  09/07/2013  . COLONOSCOPY WITH PROPOFOL N/A 08/28/2015   Procedure: COLONOSCOPY WITH PROPOFOL;  Surgeon: Lucilla Lame, MD;  Location: [facility];  Service: Endoscopy;  Laterality: N/A;  Diabetic oral  . ESOPHAGOGASTRODUODENOSCOPY  2014   gastritis  . POLYPECTOMY  08/28/2015   Procedure: POLYPECTOMY INTESTINAL;  Surgeon: Lucilla Lame, MD;  Location: [facility];  Service: Endoscopy;;  Rectal polyp  . TUBAL LIGATION Bilateral   . UMBILICAL HERNIA REPAIR N/A 08/09/2016   Procedure: HERNIA REPAIR UMBILICAL ADULT;  Surgeon: Olean Ree, MD;  Location: [facility];  Service: General;  Laterality: N/A;  . VENTRAL HERNIA REPAIR N/A 04/10/2015   Procedure: HERNIA REPAIR VENTRAL ADULT;  Surgeon: Leonie Green, MD;  Location: [facility];  Service: General;  Laterality: N/A;    Prior to Admission medications   Medication Sig Start Date End Date Taking? Authorizing Provider  acetaminophen (TYLENOL) 325 MG tablet Take 650 mg by mouth daily as  needed. 09/08/13  Yes [provider]  Alcohol Swabs (B-D SINGLE USE SWABS REGULAR) PADS 1 each by Does not apply route 2 (two) times daily. 11/22/17  Yes Glean Hess, MD  amLODipine (NORVASC) 10 MG tablet Take 1 tablet (10 mg total) by mouth daily. 11/23/17  Yes Glean Hess, MD  anastrozole (ARIMIDEX) 1 MG tablet Take 1 tablet (1 mg total) by mouth daily. 11/15/17  Yes Lequita Asal, MD  aspirin EC 81 MG tablet Take 81 mg by mouth daily.   Yes [provider]  atorvastatin (LIPITOR) 10 MG tablet  Take 1 tablet (10 mg total) by mouth daily. 04/12/18  Yes Glean Hess, MD  Blood Glucose Calibration (TRUE METRIX LEVEL 1) Low SOLN 1 each by In Vitro route daily as needed. 11/22/17  Yes Glean Hess, MD  Blood Glucose Monitoring Suppl (TRUE METRIX METER) DEVI 1 each by Does not apply route daily. 12/15/17  Yes Glean Hess, MD  colchicine 0.6 MG tablet Take 1 tablet (0.6 mg total) by mouth 2 (two) times daily. Up to 5 days as needed 12/15/17  Yes Glean Hess, MD  glucose blood test strip Use as instructed 12/15/17  Yes Glean Hess, MD  LANCETS MICRO THIN 33G MISC 1 each by Does not apply route daily. 12/15/17  Yes Glean Hess, MD  lisinopril (PRINIVIL,ZESTRIL) 40 MG tablet Take 1 tablet (40 mg total) by mouth daily. 11/23/17  Yes Glean Hess, MD  metFORMIN (GLUCOPHAGE) 500 MG tablet Take 1 tablet (500 mg total) by mouth every morning. 11/23/17  Yes Glean Hess, MD  naproxen sodium (ALEVE) 220 MG tablet Take 220 mg by mouth.   Yes [provider]  triamterene-hydrochlorothiazide (MAXZIDE-25) 37.5-25 MG tablet Take 1 tablet by mouth daily. 01/25/17  Yes Glean Hess, MD    Family History  Problem Relation Age of Onset  . Cancer Mother        Pancreatic  . Cancer Father        Throat  . Healthy Sister   . HIV Brother   . Cancer Maternal Grandmother        Breast  . Breast cancer Maternal Grandmother 69     Social History   Tobacco Use  . Smoking status: Current Every Day Smoker    Packs/day: 0.50    Years: 38.00    Pack years: 19.00    Types: Cigarettes  . Smokeless tobacco: Never Used  . Tobacco comment: Smoke 8 cigerattes a day.   Substance Use Topics  . Alcohol use: Yes    Alcohol/week: 2.0 standard drinks    Types: 2 Cans of beer per week    Comment: Occasionally   . Drug use: No    Allergies as of 06/11/2018  . (No Known Allergies)    Review of Systems:    All systems reviewed and negative except where noted in  HPI.   Physical Exam:  BP (!) 149/85   Pulse 85   Ht 5\' 7"  (1.702 m)   Wt 213 lb 12.8 oz (97 kg)   BMI 33.49 kg/m  No LMP recorded. Patient is postmenopausal. Psych:  Alert and cooperative. Normal mood and affect. General:   Alert,  Well-developed, well-nourished, pleasant and cooperative in NAD Head:  Normocephalic and atraumatic. Eyes:  Sclera clear, no icterus.   Conjunctiva pink. Ears:  Normal auditory acuity. Nose:  No deformity, discharge, or lesions. Mouth:  No deformity or lesions,oropharynx  pink & moist. Neck:  Supple; no masses or thyromegaly. Abdomen:  Normal bowel sounds.  No bruits.  Soft, non-tender and non-distended without masses, hepatosplenomegaly or hernias noted.  No guarding or rebound tenderness.    Msk:  Symmetrical without gross deformities. Good, equal movement & strength bilaterally. Pulses:  Normal pulses noted. Extremities:  No clubbing or edema.  No cyanosis. Neurologic:  Alert and oriented x3;  grossly normal neurologically. Skin:  Intact without significant lesions or rashes. No jaundice. Lymph Nodes:  No significant cervical adenopathy. Psych:  Alert and cooperative. Normal mood and affect.   Labs: CBC    Component Value Date/Time   WBC 7.8 12/25/2017 0947   RBC 4.71 12/25/2017 0947   HGB 12.8 12/25/2017 0947   HGB 13.6 05/26/2017 0928   HCT 38.5 12/25/2017 0947   HCT 40.7 05/26/2017 0928   PLT 228 12/25/2017 0947   PLT 231 05/26/2017 0928   MCV 81.8 12/25/2017 0947   MCV 80 05/26/2017 0928   MCV 83 09/09/2014 1106   MCH 27.3 12/25/2017 0947   MCHC 33.3 12/25/2017 0947   RDW 14.4 12/25/2017 0947   RDW 14.6 05/26/2017 0928   RDW 13.7 09/09/2014 1106   LYMPHSABS 3.0 12/25/2017 0947   LYMPHSABS 2.5 05/26/2017 0928   LYMPHSABS 2.7 09/09/2014 1106   MONOABS 0.4 12/25/2017 0947   MONOABS 0.4 09/09/2014 1106   EOSABS 0.1 12/25/2017 0947   EOSABS 0.1 05/26/2017 0928   EOSABS 0.2 09/09/2014 1106   BASOSABS 0.1 12/25/2017 0947   BASOSABS  0.0 05/26/2017 0928   BASOSABS 0.0 09/09/2014 1106   CMP     Component Value Date/Time   NA 139 04/11/2018 0931   NA 145 (H) 05/26/2017 0928   NA 137 09/09/2014 1106   K 4.2 04/11/2018 0931   K 4.3 09/09/2014 1106   CL 103 04/11/2018 0931   CL 105 09/09/2014 1106   CO2 25 04/11/2018 0931   CO2 24 09/09/2014 1106   GLUCOSE 110 (H) 04/11/2018 0931   GLUCOSE 113 (H) 09/09/2014 1106   BUN 12 04/11/2018 0931   BUN 8 05/26/2017 0928   BUN 11 09/09/2014 1106   CREATININE 0.94 04/11/2018 0931   CREATININE 0.67 09/09/2014 1106   CALCIUM 9.2 04/11/2018 0931   CALCIUM 9.1 09/09/2014 1106   PROT 7.9 04/11/2018 0931   PROT 7.1 05/26/2017 0928   PROT 7.9 09/09/2014 1106   ALBUMIN 4.2 04/11/2018 0931   ALBUMIN 4.4 05/26/2017 0928   ALBUMIN 4.2 09/09/2014 1106   AST 21 04/11/2018 0931   AST 24 09/09/2014 1106   ALT 25 04/11/2018 0931   ALT 41 09/09/2014 1106   ALKPHOS 95 04/11/2018 0931   ALKPHOS 96 09/09/2014 1106   BILITOT 0.2 (L) 04/11/2018 0931   BILITOT <0.2 05/26/2017 0928   BILITOT 0.6 09/09/2014 1106   GFRNONAA >60 04/11/2018 0931   GFRNONAA >60 09/09/2014 1106   GFRAA >60 04/11/2018 0931   GFRAA >60 09/09/2014 1106    Imaging Studies: CT abdomen IMPRESSION: No acute intra-abdominal or intrapelvic abnormalities.  Incomplete gastric distention, unable to exclude gastric wall thickening proximally.  Patient stated she has a history of appendectomy but a normal appearing appendix is identified in the RIGHT pelvis.  Electronically Signed   By: Lavonia Dana M.D.   On: 02/16/2016 09:31  Assessment and Plan:   Donna Riley is a 58 y.o. y/o female has been referred for history of diabetes, with intermittent bloating, and sensation of " food stopping  in the stomach" chronically  Patient's symptoms may be related to slow gastric emptying given her diabetes EGD would help [place] evaluate for any underlying lesions such as ulcers or mass that could affect gastric  emptying as well. If EGD is normal, next up would be gastric emptying study Encouraged good diabetic control as that will help with gastric motility as well.  I have discussed alternative options, risks & benefits,  which include, but are not limited to, bleeding, infection, perforation,respiratory complication & drug reaction.  The patient agrees with this plan & written consent will be obtained.    No indication for colonoscopy at this time  I also discussed with her that since her bowel movements only occur if she drinks coffee and not otherwise, she can try taking MiraLAX every other day to see if having full her bowel movements have with her bloating as well.  She is willing to try this as well.   Dr Donna Riley  Speech recognition software was used to dictate the above note.

## 2018-06-25 ENCOUNTER — Telehealth: Payer: Self-pay | Admitting: Gastroenterology

## 2018-06-25 NOTE — Telephone Encounter (Signed)
Pt left vm she is scheduled for a procedures 07/04/18 she needs to r/s due to a death in her family

## 2018-06-27 NOTE — Telephone Encounter (Signed)
Pt has requested to cancel her EGD due to death of her husband.  She will call back to reschedule.  Thanks [employer]

## 2018-06-28 ENCOUNTER — Other Ambulatory Visit: Payer: Self-pay | Admitting: Hematology and Oncology

## 2018-06-29 ENCOUNTER — Other Ambulatory Visit: Payer: Self-pay | Admitting: *Deleted

## 2018-06-29 MED ORDER — ANASTROZOLE 1 MG PO TABS: 1.0000 mg | ORAL_TABLET | Freq: Every day | ORAL | 0 refills | Status: DC

## 2018-07-04 ENCOUNTER — Ambulatory Visit: Admit: 2018-07-04 | Payer: Medicare HMO | Admitting: Gastroenterology

## 2018-07-04 SURGERY — ESOPHAGOGASTRODUODENOSCOPY (EGD) WITH PROPOFOL
Anesthesia: General

## 2018-07-09 ENCOUNTER — Ambulatory Visit (INDEPENDENT_AMBULATORY_CARE_PROVIDER_SITE_OTHER): Payer: Medicare HMO

## 2018-07-09 ENCOUNTER — Ambulatory Visit: Payer: Medicare HMO

## 2018-07-09 VITALS — BP 142/80 | HR 86 | Temp 98.4°F | Resp 16 | Ht 67.0 in | Wt 210.6 lb

## 2018-07-09 DIAGNOSIS — Z0000 Encounter for general adult medical examination without abnormal findings: Secondary | ICD-10-CM | POA: Diagnosis not present

## 2018-07-09 NOTE — Progress Notes (Signed)
Subjective:   Donna Riley is a 58 y.o. female who presents for Medicare Annual (Subsequent) preventive examination.  Review of Systems:  Cardiac Risk Factors include: advanced age (>31men, >39 women);obesity (BMI >30kg/m2);smoking/ tobacco exposure;hypertension;dyslipidemia;diabetes mellitus     Objective:     Vitals: BP (!) 142/80 (BP Location: Right Arm, Patient Position: Sitting, Cuff Size: Normal)   Pulse 86   Temp 98.4 F (36.9 C) (Oral)   Resp 16   Ht 5\' 7"  (1.702 m)   Wt 210 lb 9.6 oz (95.5 kg)   SpO2 99%   BMI 32.98 kg/m   Body mass index is 32.98 kg/m.  Advanced Directives 07/09/2018 01/23/2018 12/25/2017 11/08/2017 06/26/2017 06/26/2017 05/18/2017  Does Patient Have a Medical Advance Directive? No No No No No No No  Would patient like information on creating a medical advance directive? No - Patient declined No - Patient declined - No - Patient declined - - Yes (MAU/Ambulatory/Procedural Areas - Information given)    Tobacco Social History   Tobacco Use  Smoking Status Current Every Day Smoker  . Packs/day: 0.50  . Years: 38.00  . Pack years: 19.00  . Types: Cigarettes  Smokeless Tobacco Never Used  Tobacco Comment   Smoke 8 cigerattes a day.      Ready to quit: No Counseling given: Not Answered Comment: Smoke 8 cigerattes a day.    Clinical Intake:  Pre-visit preparation completed: Yes  Pain : No/denies pain     BMI - recorded: 32.98 Nutritional Status: BMI > 30  Obese Nutritional Risks: Nausea/ vomitting/ diarrhea(diarrhea with metformin) Diabetes: Yes CBG done?: No Did pt. bring in CBG monitor from home?: No   Nutrition Risk Assessment:  Has the patient had any N/V/D within the last 2 months?  Yes  Does the patient have any non-healing wounds?  No  Has the patient had any unintentional weight loss or weight gain?  No   Diabetes:  Is the patient diabetic?  Yes  If diabetic, was a CBG obtained today?  No  Did the patient bring in  their glucometer from home?  No  How often do you monitor your CBG's? Daily fasting in morning, results in low 100s.   Financial Strains and Diabetes Management:  Are you having any financial strains with the device, your supplies or your medication? No .  Does the patient want to be seen by Chronic Care Management for management of their diabetes?  No  Would the patient like to be referred to a Nutritionist or for Diabetic Management?  No   Diabetic Exams:  Diabetic Eye Exam:Overdue for diabetic eye exam. Scheduled for appt with Dr. Ellin Mayhew 07/27/18.  Diabetic Foot Exam: Completed 12/15/17.   How often do you need to have someone help you when you read instructions, pamphlets, or other written materials from your doctor or pharmacy?: 1 - Never What is the last grade level you completed in school?: 10th grade  Interpreter Needed?: No  Information entered by :: Clemetine Marker LPN  Past Medical History:  Diagnosis Date  . Arthritis    shoulders and neck  . Breast cancer ([place]) 2016    Left breast with chemo + rad tx's with lumpectomy.  . Diabetes mellitus without complication ([place]) 09/537  . Elevated LFTs   . Fatigue 08/01/2016  . Gout   . Hydradenitis 01/23/2015  . Hyperlipidemia   . Hypertension   . Mastitis 10/15/2016  . Personal history of chemotherapy   . Personal history of  radiation therapy   . Positive PPD, treated   . Shortness of breath dyspnea    with exertion   Past Surgical History:  Procedure Laterality Date  . BREAST BIOPSY Left 07/31/2014   positive. Lumpectomy 08/27/2014 with chemo and rad  . BREAST BIOPSY Left 03/08/2016   INTRADUCTAL PAPILLOMA WITH SCLEROSIS AND CALCIFICATIONS  . BREAST BIOPSY Left 03/08/2016   FAT NECROSIS WITH FIBROSIS  . BREAST LUMPECTOMY WITH NEEDLE LOCALIZATION Left 04/05/2016   Procedure: BREAST LUMPECTOMY WITH NEEDLE LOCALIZATION;  Surgeon: Hubbard Robinson, MD;  Location: [facility];  Service: General;  Laterality: Left;  . BREAST  LUMPECTOMY WITH NEEDLE LOCALIZATION AND AXILLARY SENTINEL LYMPH NODE BX Left 08/27/14  . CHOLECYSTECTOMY  09/07/2013  . COLONOSCOPY WITH PROPOFOL N/A 08/28/2015   Procedure: COLONOSCOPY WITH PROPOFOL;  Surgeon: Lucilla Lame, MD;  Location: [facility];  Service: Endoscopy;  Laterality: N/A;  Diabetic oral  . ESOPHAGOGASTRODUODENOSCOPY  2014   gastritis  . POLYPECTOMY  08/28/2015   Procedure: POLYPECTOMY INTESTINAL;  Surgeon: Lucilla Lame, MD;  Location: [facility];  Service: Endoscopy;;  Rectal polyp  . TUBAL LIGATION Bilateral   . UMBILICAL HERNIA REPAIR N/A 08/09/2016   Procedure: HERNIA REPAIR UMBILICAL ADULT;  Surgeon: Olean Ree, MD;  Location: [facility];  Service: General;  Laterality: N/A;  . VENTRAL HERNIA REPAIR N/A 04/10/2015   Procedure: HERNIA REPAIR VENTRAL ADULT;  Surgeon: Leonie Green, MD;  Location: [facility];  Service: General;  Laterality: N/A;   Family History  Problem Relation Age of Onset  . Cancer Mother        Pancreatic  . Cancer Father        Throat  . Healthy Sister   . HIV Brother   . Cancer Maternal Grandmother        Breast  . Breast cancer Maternal Grandmother 75   Social History   Socioeconomic History  . Marital status: Widowed    Spouse name: Not on file  . Number of children: 4  . Years of education: Not on file  . Highest education level: 10th grade  Occupational History  . Occupation: Disabled  Social Needs  . Financial resource strain: Not hard at all  . Food insecurity:    Worry: Never true    Inability: Never true  . Transportation needs:    Medical: No    Non-medical: No  Tobacco Use  . Smoking status: Current Every Day Smoker    Packs/day: 0.50    Years: 38.00    Pack years: 19.00    Types: Cigarettes  . Smokeless tobacco: Never Used  . Tobacco comment: Smoke 8 cigerattes a day.   Substance and Sexual Activity  . Alcohol use: Yes    Alcohol/week: 2.0 standard drinks    Types: 2 Cans of beer per week     Comment: Occasionally   . Drug use: No  . Sexual activity: Not Currently  Lifestyle  . Physical activity:    Days per week: 2 days    Minutes per session: 30 min  . Stress: Rather much  Relationships  . Social connections:    Talks on phone: More than three times a week    Gets together: More than three times a week    Attends religious service: More than 4 times per year    Active member of club or organization: No    Attends meetings of clubs or organizations: Never    Relationship status: Married  Other Topics  Concern  . Not on file  Social History Narrative  . Not on file    Outpatient Encounter Medications as of 07/09/2018  Medication Sig  . acetaminophen (TYLENOL) 325 MG tablet Take 650 mg by mouth daily as needed.  . Alcohol Swabs (B-D SINGLE USE SWABS REGULAR) PADS 1 each by Does not apply route 2 (two) times daily.  Marland Kitchen amLODipine (NORVASC) 10 MG tablet Take 1 tablet (10 mg total) by mouth daily.  Marland Kitchen anastrozole (ARIMIDEX) 1 MG tablet Take 1 tablet (1 mg total) by mouth daily.  Marland Kitchen aspirin EC 81 MG tablet Take 81 mg by mouth daily.  Marland Kitchen atorvastatin (LIPITOR) 10 MG tablet Take 1 tablet (10 mg total) by mouth daily.  . Blood Glucose Calibration (TRUE METRIX LEVEL 1) Low SOLN 1 each by In Vitro route daily as needed.  . Blood Glucose Monitoring Suppl (TRUE METRIX METER) DEVI 1 each by Does not apply route daily.  . colchicine 0.6 MG tablet Take 1 tablet (0.6 mg total) by mouth 2 (two) times daily. Up to 5 days as needed  . lisinopril (PRINIVIL,ZESTRIL) 40 MG tablet Take 1 tablet (40 mg total) by mouth daily.  . metFORMIN (GLUCOPHAGE) 500 MG tablet Take 1 tablet (500 mg total) by mouth every morning.  . naproxen sodium (ALEVE) 220 MG tablet Take 220 mg by mouth.  . triamterene-hydrochlorothiazide (MAXZIDE-25) 37.5-25 MG tablet Take 1 tablet by mouth daily.  . [DISCONTINUED] glucose blood test strip Use as instructed  . [DISCONTINUED] LANCETS MICRO THIN 33G MISC 1 each by Does not  apply route daily.   No facility-administered encounter medications on file as of 07/09/2018.     Activities of Daily Living In your present state of health, do you have any difficulty performing the following activities: 07/09/2018  Hearing? N  Comment declines hearing aids  Vision? Y  Comment eye appt scheduled later this month  Difficulty concentrating or making decisions? N  Walking or climbing stairs? N  Dressing or bathing? N  Doing errands, shopping? N  Preparing Food and eating ? N  Using the Toilet? N  In the past six months, have you accidently leaked urine? N  Do you have problems with loss of bowel control? N  Managing your Medications? N  Managing your Finances? N  Housekeeping or managing your Housekeeping? N  Some recent data might be hidden    Patient Care Team: Glean Hess, MD as PCP - General (Internal Medicine)    Assessment:   This is a routine wellness examination for [place].  Exercise Activities and Dietary recommendations Current Exercise Habits: Home exercise routine, Type of exercise: stretching;walking, Time (Minutes): 30, Frequency (Times/Week): 2, Weekly Exercise (Minutes/Week): 60, Intensity: Mild, Exercise limited by: None identified  Goals    . Exercise 150 min/wk Moderate Activity     Recommend to exercise at least 150 minutes per week     . Quit smoking / using tobacco       Fall Risk Fall Risk  07/09/2018 11/08/2017 05/18/2017 04/18/2016 09/16/2015  Falls in the past year? 1 No No No No  Number falls in past yr: 1 - - - -  Comment fell at night one time when going to the bathroom - - - -  Injury with Fall? 0 - - - -  Follow up Falls prevention discussed - - - -   FALL RISK PREVENTION PERTAINING TO THE HOME:  Any stairs in or around the home? Yes  If so, are  they are without handrails? Yes   Home free of loose throw rugs in walkways, pet beds, electrical cords, etc? Yes  Adequate lighting in your home to reduce risk of falls?  Yes   ASSISTIVE DEVICES UTILIZED TO PREVENT FALLS:  Life alert? No  Use of a cane, walker or w/c? No  Grab bars in the bathroom? No  Shower chair or bench in shower? No  Elevated toilet seat or a handicapped toilet? No   DME ORDERS:  DME order needed?  No   TIMED UP AND GO:  Was the test performed? Yes .  Length of time to ambulate 10 feet: 5 sec.   GAIT:  Appearance of gait: Gait stead-fast and without the use of an assistive device.    Education: Fall risk prevention has been discussed.  Intervention(s) required? No   Depression Screen PHQ 2/9 Scores 07/09/2018 11/08/2017 11/03/2017 05/18/2017  PHQ - 2 Score 1 0 0 0  PHQ- 9 Score - - 3 -     Cognitive Function     6CIT Screen 07/09/2018 05/18/2017  What Year? 0 points 0 points  What month? 0 points 0 points  What time? 0 points 0 points  Count back from 20 0 points 0 points  Months in reverse 0 points 2 points  Repeat phrase 0 points 8 points  Total Score 0 10    Immunization History  Administered Date(s) Administered  . Influenza Inj Mdck Quad With Preservative 03/12/2018  . Influenza,inj,Quad PF,6+ Mos 02/22/2016  . Influenza-Unspecified 01/30/2014, 02/25/2017, 03/12/2018  . Pneumococcal Polysaccharide-23 01/25/2017    Qualifies for Shingles Vaccine? Yes . Due for Shingrix. Education has been provided regarding the importance of this vaccine. Pt has been advised to call insurance company to determine out of pocket expense. Advised may also receive vaccine at local pharmacy or Health Dept. Verbalized acceptance and understanding.  Tdap: Although this vaccine is not a covered service during a Wellness Exam, does the patient still wish to receive this vaccine today?  No .  Education has been provided regarding the importance of this vaccine. Advised may receive this vaccine at local pharmacy or Health Dept. Aware to provide a copy of the vaccination record if obtained from local pharmacy or Health Dept. Verbalized  acceptance and understanding.  Flu Vaccine: Up to date  Pneumococcal Vaccine: PCV 13 due age 19  Screening Tests Health Maintenance  Topic Date Due  . OPHTHALMOLOGY EXAM  02/25/1970  . TETANUS/TDAP  02/26/1979  . PAP SMEAR-Modifier  07/19/2018  . MAMMOGRAM  09/15/2018  . HEMOGLOBIN A1C  10/10/2018  . FOOT EXAM  12/16/2018  . COLONOSCOPY  08/27/2020  . DEXA SCAN  01/18/2028  . INFLUENZA VACCINE  Completed  . PNEUMOCOCCAL POLYSACCHARIDE VACCINE AGE 67-64 HIGH RISK  Completed  . HIV Screening  Completed  . Hepatitis C Screening  Addressed   Cancer Screenings:  Colorectal Screening: Completed 08/28/15. Repeat every 5 years;  Mammogram: Completed 09/14/17. Repeat every year; Ordered by oncologist.   Bone Density: Completed 01/17/18. Results reflect  OSTEOPOROSIS. Repeat every 2 years.  Lung Cancer Screening: (Low Dose CT Chest recommended if Age 71-80 years, 30 pack-year currently smoking OR have quit w/in 15years.) does not qualify.    Additional Screening:  Hepatitis C Screening: does qualify; Completed 08/28/13  Vision Screening: Recommended annual ophthalmology exams for early detection of glaucoma and other disorders of the eye. Is the patient up to date with their annual eye exam?  No  Who is the  provider or what is the name of the office in which the pt attends annual eye exams? Dr. Ellin Mayhew  Dental Screening: Recommended annual dental exams for proper oral hygiene  Community Resource Referral:  CRR required this visit?  No      Plan:     I have personally reviewed and addressed the Medicare Annual Wellness questionnaire and have noted the following in the patient's chart:  A. Medical and social history B. Use of alcohol, tobacco or illicit drugs  C. Current medications and supplements D. Functional ability and status E.  Nutritional status F.  Physical activity G. Advance directives H. List of other physicians I.  Hospitalizations, surgeries, and ER visits in  previous 12 months J.  [place] such as hearing and vision if needed, cognitive and depression L. Referrals and appointments   In addition, I have reviewed and discussed with patient certain preventive protocols, quality metrics, and best practice recommendations. A written personalized care plan for preventive services as well as general preventive health recommendations were provided to patient.   Signed,  Clemetine Marker, LPN Nurse Health Advisor   Nurse Notes: Pt recently lost her husband of 28 years but seems to be coping as well as can be expected. She c/o metformin causing stomach upset and cramping, advised to take with food and schedule follow up appt with Dr. Army Melia.

## 2018-07-09 NOTE — Patient Instructions (Signed)
Donna Riley , Thank you for taking time to come for your Medicare Wellness Visit. I appreciate your ongoing commitment to your health goals. Please review the following plan we discussed and let me know if I can assist you in the future.   Screening recommendations/referrals: Colonoscopy: done 08/28/15 repeat in 2022 Mammogram: done 09/14/17 Bone Density: done 01/17/18 repeat in 2 years Recommended yearly ophthalmology/optometry visit for glaucoma screening and checkup Recommended yearly dental visit for hygiene and checkup  Vaccinations: Influenza vaccine: done 03/12/18 Pneumococcal vaccine: due age 14 Tdap vaccine: due - please contact us if you get a cut or scrape Shingles vaccine: Shingrix discussed. Please contact your pharmacy for coverage information.     Advanced directives: Advance directive discussed with you today. Even though you declined this today please call our office should you change your mind and we can give you the proper paperwork for you to fill out.  Conditions/risks identified: If you wish to quit smoking, help is available. For free tobacco cessation program offerings call the [facility] at ([phone] or Live Well Line at [phone]. You may also visit [email or web address] or email [email or web address] for more information on other programs.    Next appointment: Please follow up in one year for your Medicare Annual Wellness visit.    Preventive Care 40-64 Years, Female Preventive care refers to lifestyle choices and visits with your health care provider that can promote health and wellness. What does preventive care include?  A yearly physical exam. This is also called an annual well check.  Dental exams once or twice a year.  Routine eye exams. Ask your health care provider how often you should have your eyes checked.  Personal lifestyle choices, including:  Daily care of your teeth and gums.  Regular physical activity.  Eating  a healthy diet.  Avoiding tobacco and drug use.  Limiting alcohol use.  Practicing safe sex.  Taking low-dose aspirin daily starting at age 69.  Taking vitamin and mineral supplements as recommended by your health care provider. What happens during an annual well check? The services and screenings done by your health care provider during your annual well check will depend on your age, overall health, lifestyle risk factors, and family history of disease. Counseling  Your health care provider may ask you questions about your:  Alcohol use.  Tobacco use.  Drug use.  Emotional well-being.  Home and relationship well-being.  Sexual activity.  Eating habits.  Work and work Statistician.  Method of birth control.  Menstrual cycle.  Pregnancy history. Screening  You may have the following tests or measurements:  Height, weight, and BMI.  Blood pressure.  Lipid and cholesterol levels. These may be checked every 5 years, or more frequently if you are over 48 years old.  Skin check.  Lung cancer screening. You may have this screening every year starting at age 35 if you have a 30-pack-year history of smoking and currently smoke or have quit within the past 15 years.  Fecal occult blood test (FOBT) of the stool. You may have this test every year starting at age 102.  Flexible sigmoidoscopy or colonoscopy. You may have a sigmoidoscopy every 5 years or a colonoscopy every 10 years starting at age 48.  Hepatitis C blood test.  Hepatitis B blood test.  Sexually transmitted disease (STD) testing.  Diabetes screening. This is done by checking your blood sugar (glucose) after you have not eaten for a while (fasting). You may have this  done every 1-3 years.  Mammogram. This may be done every 1-2 years. Talk to your health care provider about when you should start having regular mammograms. This may depend on whether you have a family history of breast cancer.  BRCA-related  cancer screening. This may be done if you have a family history of breast, ovarian, tubal, or peritoneal cancers.  Pelvic exam and Pap test. This may be done every 3 years starting at age 44. Starting at age 56, this may be done every 5 years if you have a Pap test in combination with an HPV test.  Bone density scan. This is done to screen for osteoporosis. You may have this scan if you are at high risk for osteoporosis. Discuss your test results, treatment options, and if necessary, the need for more tests with your health care provider. Vaccines  Your health care provider may recommend certain vaccines, such as:  Influenza vaccine. This is recommended every year.  Tetanus, diphtheria, and acellular pertussis (Tdap, Td) vaccine. You may need a Td booster every 10 years.  Zoster vaccine. You may need this after age 13.  Pneumococcal 13-valent conjugate (PCV13) vaccine. You may need this if you have certain conditions and were not previously vaccinated.  Pneumococcal polysaccharide (PPSV23) vaccine. You may need one or two doses if you smoke cigarettes or if you have certain conditions. Talk to your health care provider about which screenings and vaccines you need and how often you need them. This information is not intended to replace advice given to you by your health care provider. Make sure you discuss any questions you have with your health care provider. Document Released: 06/12/2015 Document Revised: 02/03/2016 Document Reviewed: 03/17/2015 Elsevier Interactive Patient Education  2017 [employer] Prevention in the Home Falls can cause injuries. They can happen to people of all ages. There are many things you can do to make your home safe and to help prevent falls. What can I do on the outside of my home?  Regularly fix the edges of walkways and driveways and fix any cracks.  Remove anything that might make you trip as you walk through a door, such as a raised step or  threshold.  Trim any bushes or trees on the path to your home.  Use bright outdoor lighting.  Clear any walking paths of anything that might make someone trip, such as rocks or tools.  Regularly check to see if handrails are loose or broken. Make sure that both sides of any steps have handrails.  Any raised decks and porches should have guardrails on the edges.  Have any leaves, snow, or ice cleared regularly.  Use sand or salt on walking paths during winter.  Clean up any spills in your garage right away. This includes oil or grease spills. What can I do in the bathroom?  Use night lights.  Install grab bars by the toilet and in the tub and shower. Do not use towel bars as grab bars.  Use non-skid mats or decals in the tub or shower.  If you need to sit down in the shower, use a plastic, non-slip stool.  Keep the floor dry. Clean up any water that spills on the floor as soon as it happens.  Remove soap buildup in the tub or shower regularly.  Attach bath mats securely with double-sided non-slip rug tape.  Do not have throw rugs and other things on the floor that can make you trip. What can  I do in the bedroom?  Use night lights.  Make sure that you have a light by your bed that is easy to reach.  Do not use any sheets or blankets that are too big for your bed. They should not hang down onto the floor.  Have a firm chair that has side arms. You can use this for support while you get dressed.  Do not have throw rugs and other things on the floor that can make you trip. What can I do in the kitchen?  Clean up any spills right away.  Avoid walking on wet floors.  Keep items that you use a lot in easy-to-reach places.  If you need to reach something above you, use a strong step stool that has a grab bar.  Keep electrical cords out of the way.  Do not use floor polish or wax that makes floors slippery. If you must use wax, use non-skid floor wax.  Do not have  throw rugs and other things on the floor that can make you trip. What can I do with my stairs?  Do not leave any items on the stairs.  Make sure that there are handrails on both sides of the stairs and use them. Fix handrails that are broken or loose. Make sure that handrails are as long as the stairways.  Check any carpeting to make sure that it is firmly attached to the stairs. Fix any carpet that is loose or worn.  Avoid having throw rugs at the top or bottom of the stairs. If you do have throw rugs, attach them to the floor with carpet tape.  Make sure that you have a light switch at the top of the stairs and the bottom of the stairs. If you do not have them, ask someone to add them for you. What else can I do to help prevent falls?  Wear shoes that:  Do not have high heels.  Have rubber bottoms.  Are comfortable and fit you well.  Are closed at the toe. Do not wear sandals.  If you use a stepladder:  Make sure that it is fully opened. Do not climb a closed stepladder.  Make sure that both sides of the stepladder are locked into place.  Ask someone to hold it for you, if possible.  Clearly mark and make sure that you can see:  Any grab bars or handrails.  First and last steps.  Where the edge of each step is.  Use tools that help you move around (mobility aids) if they are needed. These include:  Canes.  Walkers.  Scooters.  Crutches.  Turn on the lights when you go into a dark area. Replace any light bulbs as soon as they burn out.  Set up your furniture so you have a clear path. Avoid moving your furniture around.  If any of your floors are uneven, fix them.  If there are any pets around you, be aware of where they are.  Review your medicines with your doctor. Some medicines can make you feel dizzy. This can increase your chance of falling. Ask your doctor what other things that you can do to help prevent falls. This information is not intended to  replace advice given to you by your health care provider. Make sure you discuss any questions you have with your health care provider. Document Released: 03/12/2009 Document Revised: 10/22/2015 Document Reviewed: 06/20/2014 Elsevier Interactive Patient Education  2017 [employer].

## 2018-07-24 ENCOUNTER — Ambulatory Visit: Payer: Medicare HMO | Admitting: Hematology and Oncology

## 2018-07-24 ENCOUNTER — Other Ambulatory Visit: Payer: Medicare HMO

## 2018-07-27 DIAGNOSIS — H04123 Dry eye syndrome of bilateral lacrimal glands: Secondary | ICD-10-CM | POA: Diagnosis not present

## 2018-07-27 DIAGNOSIS — E119 Type 2 diabetes mellitus without complications: Secondary | ICD-10-CM | POA: Diagnosis not present

## 2018-07-27 DIAGNOSIS — Z0100 Encounter for examination of eyes and vision without abnormal findings: Secondary | ICD-10-CM | POA: Diagnosis not present

## 2018-07-27 DIAGNOSIS — H35013 Changes in retinal vascular appearance, bilateral: Secondary | ICD-10-CM | POA: Diagnosis not present

## 2018-07-27 LAB — HM DIABETES EYE EXAM

## 2018-08-08 ENCOUNTER — Encounter: Payer: Self-pay | Admitting: Urgent Care

## 2018-08-08 ENCOUNTER — Inpatient Hospital Stay (HOSPITAL_BASED_OUTPATIENT_CLINIC_OR_DEPARTMENT_OTHER): Payer: Medicare HMO | Admitting: Urgent Care

## 2018-08-08 ENCOUNTER — Inpatient Hospital Stay: Payer: Medicare HMO | Attending: Hematology and Oncology

## 2018-08-08 ENCOUNTER — Other Ambulatory Visit: Payer: Self-pay

## 2018-08-08 VITALS — BP 137/69 | HR 73 | Temp 97.4°F | Resp 18 | Ht 67.0 in | Wt 207.0 lb

## 2018-08-08 DIAGNOSIS — Z923 Personal history of irradiation: Secondary | ICD-10-CM | POA: Diagnosis not present

## 2018-08-08 DIAGNOSIS — Z170 Estrogen receptor positive status [ER+]: Secondary | ICD-10-CM

## 2018-08-08 DIAGNOSIS — Z9221 Personal history of antineoplastic chemotherapy: Secondary | ICD-10-CM | POA: Diagnosis not present

## 2018-08-08 DIAGNOSIS — Z79811 Long term (current) use of aromatase inhibitors: Secondary | ICD-10-CM | POA: Insufficient documentation

## 2018-08-08 DIAGNOSIS — C50612 Malignant neoplasm of axillary tail of left female breast: Secondary | ICD-10-CM | POA: Diagnosis not present

## 2018-08-08 DIAGNOSIS — N644 Mastodynia: Secondary | ICD-10-CM

## 2018-08-08 DIAGNOSIS — Z79899 Other long term (current) drug therapy: Secondary | ICD-10-CM | POA: Insufficient documentation

## 2018-08-08 DIAGNOSIS — M810 Age-related osteoporosis without current pathological fracture: Secondary | ICD-10-CM | POA: Diagnosis not present

## 2018-08-08 DIAGNOSIS — Z853 Personal history of malignant neoplasm of breast: Secondary | ICD-10-CM

## 2018-08-08 DIAGNOSIS — C50912 Malignant neoplasm of unspecified site of left female breast: Secondary | ICD-10-CM

## 2018-08-08 DIAGNOSIS — K30 Functional dyspepsia: Secondary | ICD-10-CM

## 2018-08-08 LAB — COMPREHENSIVE METABOLIC PANEL
ALT: 24 U/L (ref 0–44)
AST: 21 U/L (ref 15–41)
Albumin: 4 g/dL (ref 3.5–5.0)
Alkaline Phosphatase: 91 U/L (ref 38–126)
Anion gap: 9 (ref 5–15)
BUN: 9 mg/dL (ref 6–20)
CO2: 25 mmol/L (ref 22–32)
Calcium: 9 mg/dL (ref 8.9–10.3)
Chloride: 107 mmol/L (ref 98–111)
Creatinine, Ser: 0.75 mg/dL (ref 0.44–1.00)
GFR calc Af Amer: >60 mL/min (ref >60)
GFR calc non Af Amer: >60 mL/min (ref >60)
Glucose, Bld: 118 mg/dL — ABNORMAL HIGH (ref 70–99)
Potassium: 3.6 mmol/L (ref 3.5–5.1)
Sodium: 141 mmol/L (ref 135–145)
Total Bilirubin: 0.3 mg/dL (ref 0.3–1.2)
Total Protein: 7.4 g/dL (ref 6.5–8.1)

## 2018-08-08 LAB — CBC WITH DIFFERENTIAL/PLATELET
Abs Immature Granulocytes: 0.02 10*3/uL (ref 0.00–0.07)
Basophils Absolute: 0.1 10*3/uL (ref 0.0–0.1)
Basophils Relative: 1 %
Eosinophils Absolute: 0.2 10*3/uL (ref 0.0–0.5)
Eosinophils Relative: 2 %
HCT: 42.8 % (ref 36.0–46.0)
Hemoglobin: 13.5 g/dL (ref 12.0–15.0)
Immature Granulocytes: 0 %
Lymphocytes Relative: 34 %
Lymphs Abs: 2.8 10*3/uL (ref 0.7–4.0)
MCH: 26.9 pg (ref 26.0–34.0)
MCHC: 31.5 g/dL (ref 30.0–36.0)
MCV: 85.4 fL (ref 80.0–100.0)
Monocytes Absolute: 0.4 10*3/uL (ref 0.1–1.0)
Monocytes Relative: 5 %
Neutro Abs: 4.8 10*3/uL (ref 1.7–7.7)
Neutrophils Relative %: 58 %
Platelets: 219 10*3/uL (ref 150–400)
RBC: 5.01 MIL/uL (ref 3.87–5.11)
RDW: 14.2 % (ref 11.5–15.5)
WBC: 8.2 10*3/uL (ref 4.0–10.5)
nRBC: 0 % (ref 0.0–0.2)

## 2018-08-08 NOTE — Progress Notes (Signed)
[place] noted to bilateral  shoulders

## 2018-08-08 NOTE — Progress Notes (Signed)
[facility] day:  08/08/18  Chief Complaint: Donna Riley is a 58 y.o. female with stage IIA left breast cancer who is seen for a 107-monthassessment.  HPI: The patient was last seen in the medical oncology clinic on 01/23/2018.  At that time patient was doing well overall.  She denied any acute concerns.  No B symptoms or recent infections.  She complained of intermittent "shooting pains" in her left wrist where "the lymph nodes were removed".  Exam revealed fibrocystic changes in the RIGHT breast.  Patient was seen in follow-up on 04/12/2019 by her PCP (Army Melia MD).  Notes reviewed.  Patient with complaints of increased generalized abdominal pain.  She was referred to gastroenterology for further evaluation.  There were no other changes to her health history or medications.  She was seen in consult on 06/11/2018 by Dr. VVonda Antigua(gastroenterology).  Notes reviewed.  Patient seen in consult for generalized abdominal pain and sensation of incomplete stomach emptying.  She had never had an EGD.  Denied dysphagia or heartburn.  No history of colon cancer.  Reviewed history of a screening colonoscopy in 07/2015 that revealed a 4 mm tubular adenoma that was removed.  Delayed gastric emptying felt to be related to patient's underlying diabetes.  EGD was recommended, with follow-up gastric emptying study if normal.  Patient scheduled for EGD on 202/03/2019 however due to a death in her family, the procedures were canceled.  To date, patient has not rescheduled EGD.  In the interim, patient notes that she is doing "as to be expected, following the death of her husband in J13-Jan-2024of this year.  Patient is experiencing difficulties with her sleep.  She complains of pain and stiffness in her BILATERAL shoulders related to increased stress. Patient denies that she has experienced any B symptoms. She denies any interval infections.   Patient continues to  experience shooting pains in her LEFT breast and axilla following lumpectomy and lymph node resection.  Mammogram and ultrasound imaging from 12/2017 revealed no significant abnormalities.  She denies any skin changes or nipple discharge.  She has no concerns with regards to her RIGHT breast.  She performs monthly self breast examinations as recommended.  Patient continues on adjuvant endocrine therapy (Arimidex) as prescribed with no perceived side effects.  Patient advises that she maintains an adequate appetite. She is eating well. Weight today is 207 lb 0.2 oz (93.9 kg), which compared to her last visit to the clinic, represents a 6 pound weight loss.  She complains of intermittent abdominal pain.  Patient denies pain in the clinic today.  Past Medical History:  Diagnosis Date  . Arthritis    shoulders and neck  . Breast cancer ([facility] 2016    Left breast with chemo + rad tx's with lumpectomy.  . Diabetes mellitus without complication ([place] 27/9892 . Elevated LFTs   . Fatigue 08/01/2016  . Gout   . Hydradenitis 01/23/2015  . Hyperlipidemia   . Hypertension   . Mastitis 10/15/2016  . Personal history of chemotherapy   . Personal history of radiation therapy   . Positive PPD, treated   . Shortness of breath dyspnea    with exertion    Past Surgical History:  Procedure Laterality Date  . BREAST BIOPSY Left 07/31/2014   positive. Lumpectomy 08/27/2014 with chemo and rad  . BREAST BIOPSY Left 03/08/2016   INTRADUCTAL PAPILLOMA WITH SCLEROSIS AND CALCIFICATIONS  . BREAST BIOPSY Left 03/08/2016  FAT NECROSIS WITH FIBROSIS  . BREAST LUMPECTOMY WITH NEEDLE LOCALIZATION Left 04/05/2016   Procedure: BREAST LUMPECTOMY WITH NEEDLE LOCALIZATION;  Surgeon: Hubbard Robinson, MD;  Location: [facility];  Service: General;  Laterality: Left;  . BREAST LUMPECTOMY WITH NEEDLE LOCALIZATION AND AXILLARY SENTINEL LYMPH NODE BX Left 08/27/14  . CHOLECYSTECTOMY  09/07/2013  . COLONOSCOPY WITH PROPOFOL N/A  08/28/2015   Procedure: COLONOSCOPY WITH PROPOFOL;  Surgeon: Lucilla Lame, MD;  Location: [facility];  Service: Endoscopy;  Laterality: N/A;  Diabetic oral  . ESOPHAGOGASTRODUODENOSCOPY  2014   gastritis  . POLYPECTOMY  08/28/2015   Procedure: POLYPECTOMY INTESTINAL;  Surgeon: Lucilla Lame, MD;  Location: [facility];  Service: Endoscopy;;  Rectal polyp  . TUBAL LIGATION Bilateral   . UMBILICAL HERNIA REPAIR N/A 08/09/2016   Procedure: HERNIA REPAIR UMBILICAL ADULT;  Surgeon: Olean Ree, MD;  Location: [facility];  Service: General;  Laterality: N/A;  . VENTRAL HERNIA REPAIR N/A 04/10/2015   Procedure: HERNIA REPAIR VENTRAL ADULT;  Surgeon: Leonie Green, MD;  Location: [facility];  Service: General;  Laterality: N/A;    Family History  Problem Relation Age of Onset  . Cancer Mother        Pancreatic  . Cancer Father        Throat  . Healthy Sister   . HIV Brother   . Cancer Maternal Grandmother        Breast  . Breast cancer Maternal Grandmother 75    Social History:  reports that she has been smoking cigarettes. She has a 19.00 pack-year smoking history. She has never used smokeless tobacco. She reports current alcohol use of about 2.0 standard drinks of alcohol per week. She reports that she does not use drugs.  She lives in [place].  The patient is alone today.  Allergies: No Known Allergies  Current Medications: Current Outpatient Medications  Medication Sig Dispense Refill  . acetaminophen (TYLENOL) 325 MG tablet Take 650 mg by mouth daily as needed.    . Alcohol Swabs (B-D SINGLE USE SWABS REGULAR) PADS 1 each by Does not apply route 2 (two) times daily. 100 each 3  . amLODipine (NORVASC) 10 MG tablet Take 1 tablet (10 mg total) by mouth daily. 90 tablet 0  . anastrozole (ARIMIDEX) 1 MG tablet Take 1 tablet (1 mg total) by mouth daily. 30 tablet 0  . aspirin EC 81 MG tablet Take 81 mg by mouth daily.    Marland Kitchen atorvastatin (LIPITOR) 10 MG tablet Take 1 tablet  (10 mg total) by mouth daily. 90 tablet 1  . Blood Glucose Calibration (TRUE METRIX LEVEL 1) Low SOLN 1 each by In Vitro route daily as needed. 1 each 3  . Blood Glucose Monitoring Suppl (TRUE METRIX METER) DEVI 1 each by Does not apply route daily. 100 Device 3  . colchicine 0.6 MG tablet Take 1 tablet (0.6 mg total) by mouth 2 (two) times daily. Up to 5 days as needed 30 tablet 1  . lisinopril (PRINIVIL,ZESTRIL) 40 MG tablet Take 1 tablet (40 mg total) by mouth daily. 90 tablet 0  . metFORMIN (GLUCOPHAGE) 500 MG tablet Take 1 tablet (500 mg total) by mouth every morning. 90 tablet 0  . naproxen sodium (ALEVE) 220 MG tablet Take 220 mg by mouth.    . triamterene-hydrochlorothiazide (MAXZIDE-25) 37.5-25 MG tablet Take 1 tablet by mouth daily. 90 tablet 3   No current facility-administered medications for this visit.      Review of  Systems  Constitutional: Positive for weight loss (down 6 pounds). Negative for diaphoresis, fever and malaise/fatigue.  HENT: Negative.   Eyes: Negative.   Respiratory: Negative for cough, hemoptysis, sputum production and shortness of breath.   Cardiovascular: Negative for chest pain, palpitations, orthopnea, leg swelling and PND.  Gastrointestinal: Positive for abdominal pain (intermittent; sees GI). Negative for blood in stool, constipation, diarrhea, melena, nausea and vomiting.  Genitourinary: Negative for dysuria, frequency, hematuria and urgency.  Musculoskeletal: Negative for back pain, falls, joint pain and myalgias.       BILATERAL shoulder pain and muscle stiffness related to stress.  "Shooting pain" in left breast and axilla.  Skin: Negative for itching and rash.  Neurological: Negative for dizziness, tremors, weakness and headaches.  Endo/Heme/Allergies: Does not bruise/bleed easily.  Psychiatric/Behavioral: Negative for depression, memory loss and suicidal ideas. The patient has insomnia. The patient is not nervous/anxious.        Grieving  appropriately following loss of husband and 05/2018  All other systems reviewed and are negative.  Performance status (ECOG): 1 - Symptomatic but completely ambulatory  Vital Signs BP 137/69 (BP Location: Right Arm, Patient Position: Sitting)   Pulse 73   Temp (!) 97.4 F (36.3 C) (Tympanic)   Resp 18   Ht '5\' 7"'$  (1.702 m)   Wt 207 lb 0.2 oz (93.9 kg)   SpO2 100%   BMI 32.42 kg/m   Physical Exam  Constitutional: She is oriented to person, place, and time and well-developed, well-nourished, and in no distress.  HENT:  Head: Normocephalic and atraumatic.  Mouth/Throat: Oropharynx is clear and moist and mucous membranes are normal.  Eyes: Pupils are equal, round, and reactive to light. EOM are normal. No scleral icterus.  Neck: Normal range of motion. Neck supple. No tracheal deviation present. No thyromegaly present.  Cardiovascular: Normal rate, regular rhythm, normal heart sounds and intact distal pulses. Exam reveals no gallop and no friction rub.  No murmur heard. Pulmonary/Chest: Effort normal and breath sounds normal. No respiratory distress. She has no wheezes. She has no rales. Right breast exhibits skin change (scattered fibrocystic changes; tender). Right breast exhibits no inverted nipple, no mass and no nipple discharge. Left breast exhibits skin change (s/p lumpectomy) and tenderness (upper outer quadrant extending into axilla; no palpable abnormalities). Left breast exhibits no inverted nipple, no mass and no nipple discharge.  Abdominal: Soft. Bowel sounds are normal. She exhibits no distension. There is no abdominal tenderness.  Musculoskeletal: Normal range of motion.        General: No tenderness or edema.  Lymphadenopathy:    She has no cervical adenopathy.    She has no axillary adenopathy.       Right: No inguinal and no supraclavicular adenopathy present.       Left: No inguinal and no supraclavicular adenopathy present.  Neurological: She is alert and oriented to  person, place, and time.  Skin: Skin is warm and dry. No rash noted. No erythema.  Psychiatric: Mood, affect and judgment normal.  Nursing note and vitals reviewed.   Appointment on 08/08/2018  Component Date Value Ref Range Status  . CA 27.29 08/08/2018 12.4  0.0 - 38.6 U/mL Final   Comment: (NOTE) Siemens Centaur Immunochemiluminometric Methodology [facility]) Values obtained with different assay methods or kits cannot be used interchangeably. Results cannot be interpreted as absolute evidence of the presence or absence of malignant disease. Performed At: [facility] [place], [place] [id number] Rush Farmer MD LJ:[id number]   .  Sodium 08/08/2018 141  135 - 145 mmol/L Final  . Potassium 08/08/2018 3.6  3.5 - 5.1 mmol/L Final  . Chloride 08/08/2018 107  98 - 111 mmol/L Final  . CO2 08/08/2018 25  22 - 32 mmol/L Final  . Glucose, Bld 08/08/2018 118* 70 - 99 mg/dL Final  . BUN 08/08/2018 9  6 - 20 mg/dL Final  . Creatinine, Ser 08/08/2018 0.75  0.44 - 1.00 mg/dL Final  . Calcium 08/08/2018 9.0  8.9 - 10.3 mg/dL Final  . Total Protein 08/08/2018 7.4  6.5 - 8.1 g/dL Final  . Albumin 08/08/2018 4.0  3.5 - 5.0 g/dL Final  . AST 08/08/2018 21  15 - 41 U/L Final  . ALT 08/08/2018 24  0 - 44 U/L Final  . Alkaline Phosphatase 08/08/2018 91  38 - 126 U/L Final  . Total Bilirubin 08/08/2018 0.3  0.3 - 1.2 mg/dL Final  . GFR calc non Af Amer 08/08/2018 >60  >60 mL/min Final  . GFR calc Af Amer 08/08/2018 >60  >60 mL/min Final  . Anion gap 08/08/2018 9  5 - 15 Final   Performed at [facility], [address], [place], [place] [address]  . WBC 08/08/2018 8.2  4.0 - 10.5 K/uL Final  . RBC 08/08/2018 5.01  3.87 - 5.11 MIL/uL Final  . Hemoglobin 08/08/2018 13.5  12.0 - 15.0 g/dL Final  . HCT 08/08/2018 42.8  36.0 - 46.0 % Final  . MCV 08/08/2018 85.4  80.0 - 100.0 fL Final  . MCH 08/08/2018 26.9  26.0 - 34.0 pg Final  . MCHC 08/08/2018 31.5  30.0 - 36.0 g/dL  Final  . RDW 08/08/2018 14.2  11.5 - 15.5 % Final  . Platelets 08/08/2018 219  150 - 400 K/uL Final  . nRBC 08/08/2018 0.0  0.0 - 0.2 % Final  . Neutrophils Relative % 08/08/2018 58  % Final  . Neutro Abs 08/08/2018 4.8  1.7 - 7.7 K/uL Final  . Lymphocytes Relative 08/08/2018 34  % Final  . Lymphs Abs 08/08/2018 2.8  0.7 - 4.0 K/uL Final  . Monocytes Relative 08/08/2018 5  % Final  . Monocytes Absolute 08/08/2018 0.4  0.1 - 1.0 K/uL Final  . Eosinophils Relative 08/08/2018 2  % Final  . Eosinophils Absolute 08/08/2018 0.2  0.0 - 0.5 K/uL Final  . Basophils Relative 08/08/2018 1  % Final  . Basophils Absolute 08/08/2018 0.1  0.0 - 0.1 K/uL Final  . Immature Granulocytes 08/08/2018 0  % Final  . Abs Immature Granulocytes 08/08/2018 0.02  0.00 - 0.07 K/uL Final   Performed at [facility], [address] [place], [place] [address]    Assessment:  Donna Riley is a 58 y.o. African American female with stage IIA left breast cancer status post partial mastectomy and sentinel lymph node biopsy on 08/27/2014. 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 There was lymphovascular invasion. One of 2 sentinel lymph nodes were positive (focus of 2.8 mm). Tumor was > 90% ER positive, > 90% PR positive, and Her2/neu negative. Pathologic stage was [id number].  Bone scan on 09/16/2014 revealed abnormal focal uptake at the level of right L3 pedicle and L5 vertebral body. Lumbar spine MRI on 09/27/2014 revealed no evidence of metastatic disease with lower lumbar facet arthritis. Abdominal and pelvic CT scan on 09/24/2014 revealed hepatomegaly and no evidence of metastatic disease.   She received  4 cycles of Taxotere and Cytoxan (09/29/2014 - 12/09/2014) with Neulasta support. She received 50.4 Gy to the left breast from 01/12/2015 until 02/23/2015.  She was started on Femara on 03/12/2015, but switched to Arimidex on  03/30/2015 secondary to diffuse joint aches.    CA27.29 has been followed: 16.9 on 09/09/2014, 12.1 on 06/02/2015, 15.6 on 08/07/2015, 17.4 on 11/23/2015, 13.6 on 02/22/2016, 18.9 on 06/20/2016, 18.4 on 10/21/2016, 15 on 02/23/2017, 14.1 on 06/26/2017, 15.6 on 12/25/2017, and 12.4 on 08/08/2018.  Bilateral diagnostic mammogram on 07/30/2015 revealed no evidence of malignancy.  Left sided mammogram and ultrasound on 02/29/2016 revealed a 1.5 x 0.6 x 0.7 cm intraductal mass.  Bilateral mammogram on 09/12/2016 revealed no focal fluid collection at the surgical site to account for the persistent drainage. There were no suspicious mammographic or targeted sonographic abnormalities at the lumpectomy site. There was no evidence of malignancy in the bilateral breasts.   Bilateral mammogram on 09/14/2017 that revealed maturing fat necrosis at the LEFT lumpectomy site. There was no evidence of malignancy in either breast.  Left diagnostic mammogram and ultrasound on 01/01/2018 revealed no significant abnormality other than a single normal appearing lymph node over the axillary tail portion of the left breast in the area of palpable abnormality and tenderness.  She had chronic left nipple discharge.  Left breast lumpectomy at the 11 o'clock position on 04/05/2016 revealed an intraductal papilloma with sclerosis and calcifications. There was fat necrosis with fibrosis and calcifications. Pathology was negative for atypia and malignancy  Colonoscopy on 08/28/2015 revealed one 4 mm polyp in the rectum (tubular adenoma).  Bone density study on 09/15/2014 revealed osteopenia with a T-score of -2.1 at L1-L4.  Bone density on 01/17/2018 revealed osteoporosis with a T-score of -2.6 in the AP spine L1-L4.  She is on calcium and vitamin D.  Symptomatically, patient is doing well overall.  She continues to grieve appropriately following the loss of her husband and January of this year.  Patient is having difficulties with  her sleep.  Patient with increased muscle tension in her neck and shoulders related to increased stress.  She complains of intermittent tenderness and "shooting pains" in her left breast and axilla.  No B symptoms or interval infections.  Eating well; weight down 6 pounds.  Exam reveals minimal tenderness in the upper outer quadrant of the LEFT breast extending into the axilla.  There are fibrocystic changes noted in the RIGHT breast.   Plan: 1. Labs today.  CBC with differential, CMP, CA27.29 2. Stage IIA LEFT breast cancer  Doing well overall.  Minimal tenderness to upper outer quadrant of left breast extending into axilla.  Review mammogram and ultrasound from 12/2017 - no significant abnormalities.  Discuss moving annual mammogram scheduled in 08/2020 or sooner date - patient declines.  Continues on adjuvant hormonal therapy (Arimidex) as prescribed.  Schedule routine mammogram for 09/17/2018. 3. Osteoporosis  Continues on calcium 1200 mg and vitamin D8 100 IU daily. Dicussed the use of Prolia to prevent bone thinning/loss associated with hormonal therapy. Side effects of this medication reviewed.  Discussed the need for dental clearance prior to beginning Prolia, as this medication increases the risk of dental complications such as osteonecrosis of the jaw.  Unable to afford dental evaluation.  Offered resources ([facility] and [facility]), however patient says that she still cannot afford.  Will have [facility] social worker [facility]) speak with patient regarding other options.  4. Delayed gastric emptying  Intermittent in  nature.  No associated nausea or vomiting.  Followed by gastroenterology -EGD recommended.  Encouraged to follow-up with GI to have recommended EGD rescheduled. 5. RTC in 6 months for MD assessment and labs (CBC with diff, CMP, CA27.29).   Honor Loh, NP  08/08/18, 7:28 AM

## 2018-08-09 LAB — CANCER ANTIGEN 27.29: CA 27.29: 12.4 U/mL (ref 0.0–38.6)

## 2018-08-12 ENCOUNTER — Encounter: Payer: Self-pay | Admitting: Internal Medicine

## 2018-09-12 ENCOUNTER — Ambulatory Visit: Payer: Medicare HMO | Admitting: Gastroenterology

## 2018-09-17 ENCOUNTER — Other Ambulatory Visit: Payer: Self-pay

## 2018-09-17 ENCOUNTER — Ambulatory Visit
Admission: RE | Admit: 2018-09-17 | Discharge: 2018-09-17 | Disposition: A | Discharge status: Home / Self Care | Payer: Medicare HMO | Source: Ambulatory Visit | Attending: Urgent Care | Admitting: Urgent Care

## 2018-09-17 DIAGNOSIS — R921 Mammographic calcification found on diagnostic imaging of breast: Secondary | ICD-10-CM | POA: Diagnosis not present

## 2018-09-17 DIAGNOSIS — Z853 Personal history of malignant neoplasm of breast: Secondary | ICD-10-CM | POA: Diagnosis not present

## 2018-10-08 ENCOUNTER — Encounter: Payer: Self-pay | Admitting: Internal Medicine

## 2018-10-09 ENCOUNTER — Encounter: Payer: Self-pay | Admitting: Internal Medicine

## 2018-10-09 ENCOUNTER — Ambulatory Visit
Admission: RE | Admit: 2018-10-09 | Discharge: 2018-10-09 | Disposition: A | Discharge status: Home / Self Care | Payer: Medicare HMO | Attending: Internal Medicine | Admitting: Internal Medicine

## 2018-10-09 ENCOUNTER — Ambulatory Visit
Admission: RE | Admit: 2018-10-09 | Discharge: 2018-10-09 | Disposition: A | Discharge status: Home / Self Care | Payer: Medicare HMO | Source: Ambulatory Visit | Attending: Internal Medicine | Admitting: Internal Medicine

## 2018-10-09 ENCOUNTER — Other Ambulatory Visit: Payer: Self-pay

## 2018-10-09 ENCOUNTER — Other Ambulatory Visit
Admission: RE | Admit: 2018-10-09 | Discharge: 2018-10-09 | Disposition: A | Discharge status: Home / Self Care | Payer: Medicare HMO | Source: Home / Self Care | Attending: Internal Medicine | Admitting: Internal Medicine

## 2018-10-09 ENCOUNTER — Ambulatory Visit (INDEPENDENT_AMBULATORY_CARE_PROVIDER_SITE_OTHER): Payer: Medicare HMO | Admitting: Internal Medicine

## 2018-10-09 VITALS — BP 140/80 | HR 60 | Ht 67.0 in | Wt 204.0 lb

## 2018-10-09 DIAGNOSIS — M545 Low back pain, unspecified: Secondary | ICD-10-CM | POA: Insufficient documentation

## 2018-10-09 DIAGNOSIS — E785 Hyperlipidemia, unspecified: Secondary | ICD-10-CM | POA: Insufficient documentation

## 2018-10-09 DIAGNOSIS — I10 Essential (primary) hypertension: Secondary | ICD-10-CM | POA: Diagnosis not present

## 2018-10-09 DIAGNOSIS — R42 Dizziness and giddiness: Secondary | ICD-10-CM

## 2018-10-09 DIAGNOSIS — E1169 Type 2 diabetes mellitus with other specified complication: Secondary | ICD-10-CM

## 2018-10-09 DIAGNOSIS — E118 Type 2 diabetes mellitus with unspecified complications: Secondary | ICD-10-CM | POA: Diagnosis not present

## 2018-10-09 HISTORY — DX: Low back pain, unspecified: M54.50

## 2018-10-09 LAB — LIPID PANEL
Cholesterol: 177 mg/dL (ref 0–200)
HDL: 37 mg/dL — ABNORMAL LOW (ref >40)
LDL Cholesterol: 70 mg/dL (ref 0–99)
Total CHOL/HDL Ratio: 4.8 RATIO
Triglycerides: 349 mg/dL — ABNORMAL HIGH (ref <150)
VLDL: 70 mg/dL — ABNORMAL HIGH (ref 0–40)

## 2018-10-09 LAB — COMPREHENSIVE METABOLIC PANEL
ALT: 24 U/L (ref 0–44)
AST: 20 U/L (ref 15–41)
Albumin: 3.9 g/dL (ref 3.5–5.0)
Alkaline Phosphatase: 93 U/L (ref 38–126)
Anion gap: 7 (ref 5–15)
BUN: 15 mg/dL (ref 6–20)
CO2: 25 mmol/L (ref 22–32)
Calcium: 8.8 mg/dL — ABNORMAL LOW (ref 8.9–10.3)
Chloride: 105 mmol/L (ref 98–111)
Creatinine, Ser: 0.84 mg/dL (ref 0.44–1.00)
GFR calc Af Amer: >60 mL/min (ref >60)
GFR calc non Af Amer: >60 mL/min (ref >60)
Glucose, Bld: 123 mg/dL — ABNORMAL HIGH (ref 70–99)
Potassium: 4.1 mmol/L (ref 3.5–5.1)
Sodium: 137 mmol/L (ref 135–145)
Total Bilirubin: 0.6 mg/dL (ref 0.3–1.2)
Total Protein: 7.2 g/dL (ref 6.5–8.1)

## 2018-10-09 LAB — HEMOGLOBIN A1C
Hgb A1c MFr Bld: 6 % — ABNORMAL HIGH (ref 4.8–5.6)
Mean Plasma Glucose: 125.5 mg/dL

## 2018-10-09 MED ORDER — TRIAMTERENE-HCTZ 37.5-25 MG PO TABS: 1.0000 | ORAL_TABLET | Freq: Every day | ORAL | 3 refills | Status: DC

## 2018-10-09 MED ORDER — LISINOPRIL 40 MG PO TABS: 40.0000 mg | ORAL_TABLET | Freq: Every day | ORAL | 3 refills | Status: DC

## 2018-10-09 MED ORDER — AMLODIPINE BESYLATE 10 MG PO TABS: 10.0000 mg | ORAL_TABLET | Freq: Every day | ORAL | 3 refills | Status: DC

## 2018-10-09 MED ORDER — METFORMIN HCL ER 500 MG PO TB24: 500.0000 mg | ORAL_TABLET | Freq: Every day | ORAL | 3 refills | Status: DC

## 2018-10-09 NOTE — Progress Notes (Signed)
Date:  10/09/2018   Name:  Donna Riley   DOB:  [birth year]   MRN:  [id number]   Chief Complaint: Hyperlipidemia (follow up) and Dizziness (Started 2 weeks ago and feeling dizzy when standing or waking up. ) Her husband passed away in 2022/07/09.  She is coping fairly well. Finances are okay, appetite good.  Some depressive sx but not severe. Hyperlipidemia  This is a chronic problem. Pertinent negatives include no chest pain or shortness of breath. Current antihyperlipidemic treatment includes statins (started last visit - here for follow up). Compliance problems include medication side effects (makes her bone ache).   Dizziness  This is a chronic problem. The current episode started more than 1 month ago. The problem occurs daily. The problem has been unchanged. Pertinent negatives include no abdominal pain, arthralgias, chest pain, coughing, fatigue, fever, headaches, joint swelling, nausea, numbness, visual change or vomiting. The symptoms are aggravated by standing (lying down). She has tried nothing for the symptoms.  Diabetes  She presents for her follow-up diabetic visit. She has type 2 diabetes mellitus. Hypoglycemia symptoms include dizziness. Pertinent negatives for hypoglycemia include no headaches or tremors. Pertinent negatives for diabetes include no chest pain, no fatigue, no polydipsia, no polyuria and no visual change. Current diabetic treatment includes oral agent (monotherapy). She is compliant with treatment most of the time. Her weight is stable. She monitors blood glucose at home 1-2 x per day. An ACE inhibitor/angiotensin II receptor blocker is being taken.  Back Pain  This is a new problem. The problem occurs daily. The problem is unchanged. The pain is present in the lumbar spine. The pain does not radiate. The pain is mild. The symptoms are aggravated by twisting. Pertinent negatives include no abdominal pain, chest pain, dysuria, fever, headaches or numbness. Risk factors  include history of cancer.    Review of Systems  Constitutional: Negative for appetite change, fatigue, fever and unexpected weight change.  HENT: Negative for hearing loss, tinnitus and trouble swallowing.   Eyes: Negative for visual disturbance.  Respiratory: Negative for cough, chest tightness and shortness of breath.   Cardiovascular: Negative for chest pain, palpitations and leg swelling.  Gastrointestinal: Positive for diarrhea (from metformine). Negative for abdominal pain, nausea and vomiting.  Endocrine: Negative for polydipsia and polyuria.  Genitourinary: Negative for dysuria and hematuria.  Musculoskeletal: Positive for back pain and gait problem (due to chronic foot pain, callus). Negative for arthralgias and joint swelling.  Neurological: Positive for dizziness. Negative for tremors, numbness and headaches.  Psychiatric/Behavioral: Negative for dysphoric mood.    Patient Active Problem List   Diagnosis Date Noted  . Hyperlipidemia associated with type 2 diabetes mellitus ([place]) 04/11/2018  . Gastroparesis 04/11/2018  . Long term (current) use of aromatase inhibitors 12/25/2017  . Arthritis   . Elevated LFTs   . Positive PPD, treated   . Umbilical hernia without obstruction and without gangrene 07/13/2016  . Rectal polyp   . Irritable bowel syndrome with both constipation and diarrhea 07/20/2015  . Type II diabetes mellitus with complication ([place]) 61/60/7371  . Pre-ulcerative calluses 06/22/2015  . Neuropathy 06/22/2015  . Hot flash, menopausal 04/09/2015  . Essential (primary) hypertension 03/25/2015  . Osteoporosis 03/12/2015  . Cancer of left female breast  ([place]) 05/30/2014  . 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 BREAST BIOPSY Left 07/31/2014   positive. Lumpectomy 08/27/2014 with chemo and rad  .  BREAST BIOPSY Left 03/08/2016   INTRADUCTAL PAPILLOMA WITH SCLEROSIS AND  CALCIFICATIONS  . BREAST BIOPSY Left 03/08/2016   FAT NECROSIS WITH FIBROSIS  . 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 BREAST LUMPECTOMY WITH NEEDLE LOCALIZATION Left 04/05/2016   Procedure: BREAST LUMPECTOMY WITH NEEDLE LOCALIZATION;  Surgeon: Hubbard Robinson, MD;  Location: [facility];  Service: General;  Laterality: Left;  . BREAST LUMPECTOMY WITH NEEDLE LOCALIZATION AND AXILLARY SENTINEL LYMPH NODE BX Left 08/27/14  . CHOLECYSTECTOMY  09/07/2013  . COLONOSCOPY WITH PROPOFOL N/A 08/28/2015   Procedure: COLONOSCOPY WITH PROPOFOL;  Surgeon: Lucilla Lame, MD;  Location: [place];  Service: Endoscopy;  Laterality: N/A;  Diabetic oral  . ESOPHAGOGASTRODUODENOSCOPY  2014   gastritis  . POLYPECTOMY  08/28/2015   Procedure: POLYPECTOMY INTESTINAL;  Surgeon: Lucilla Lame, MD;  Location: [place];  Service: Endoscopy;;  Rectal polyp  . TUBAL LIGATION Bilateral   . UMBILICAL HERNIA REPAIR N/A 08/09/2016   Procedure: HERNIA REPAIR UMBILICAL ADULT;  Surgeon: Olean Ree, MD;  Location: [facility];  Service: General;  Laterality: N/A;  . VENTRAL HERNIA REPAIR N/A 04/10/2015   Procedure: HERNIA REPAIR VENTRAL ADULT;  Surgeon: Leonie Green, MD;  Location: [facility];  Service: General;  Laterality: N/A;    Social History   Tobacco Use  . Smoking status: Current Every Day Smoker    Packs/day: 0.50    Years: 38.00    Pack years: 19.00    Types: Cigarettes  . Smokeless tobacco: Never Used  . Tobacco comment: Smoke 8 cigerattes a day.   Substance Use Topics  . Alcohol use: Yes    Alcohol/week: 2.0 standard drinks    Types: 2 Cans of beer per week    Comment: Occasionally   . Drug use: No     Medication list has been reviewed and updated.  Current Meds  Medication Sig  . acetaminophen (TYLENOL) 325 MG tablet Take 650 mg by mouth daily as needed.  . Alcohol Swabs (B-D SINGLE USE SWABS REGULAR) PADS 1 each by Does not apply route 2 (two) times  daily.  Marland Kitchen amLODipine (NORVASC) 10 MG tablet Take 1 tablet (10 mg total) by mouth daily.  Marland Kitchen anastrozole (ARIMIDEX) 1 MG tablet Take 1 tablet (1 mg total) by mouth daily.  Marland Kitchen aspirin EC 81 MG tablet Take 81 mg by mouth daily.  Marland Kitchen atorvastatin (LIPITOR) 10 MG tablet Take 1 tablet (10 mg total) by mouth daily.  . Blood Glucose Calibration (TRUE METRIX LEVEL 1) Low SOLN 1 each by In Vitro route daily as needed.  . Blood Glucose Monitoring Suppl (TRUE METRIX METER) DEVI 1 each by Does not apply route daily.  . colchicine 0.6 MG tablet Take 1 tablet (0.6 mg total) by mouth 2 (two) times daily. Up to 5 days as needed  . lisinopril (PRINIVIL,ZESTRIL) 40 MG tablet Take 1 tablet (40 mg total) by mouth daily.  . metFORMIN (GLUCOPHAGE) 500 MG tablet Take 1 tablet (500 mg total) by mouth every morning.  . naproxen sodium (ALEVE) 220 MG tablet Take 220 mg by mouth.  . triamterene-hydrochlorothiazide (MAXZIDE-25) 37.5-25 MG tablet Take 1 tablet by mouth daily.    PHQ 2/9 Scores 10/09/2018 07/09/2018 11/08/2017 11/03/2017  PHQ - 2 Score 3 1 0 0  PHQ- 9 Score 10 - - 3    BP Readings from Last 3 Encounters:  10/09/18 140/80  08/08/18 137/69  07/09/18 (!) 142/80    Physical Exam Vitals signs  and nursing note reviewed.  Constitutional:      General: She is not in acute distress.    Appearance: She is well-developed.  HENT:     Head: Normocephalic and atraumatic.  Eyes:     Extraocular Movements: Extraocular movements intact.     Pupils: Pupils are equal, round, and reactive to light.  Neck:     Musculoskeletal: Normal range of motion and neck supple.  Cardiovascular:     Rate and Rhythm: Normal rate and regular rhythm.     Pulses: Normal pulses.  Pulmonary:     Effort: Pulmonary effort is normal. No respiratory distress.     Breath sounds: No wheezing or rhonchi.  Musculoskeletal: Normal range of motion.     Lumbar back: She exhibits tenderness. She exhibits no spasm.     Right lower leg: No edema.      Left lower leg: No edema.  Lymphadenopathy:     Cervical: No cervical adenopathy.  Skin:    General: Skin is warm and dry.     Findings: No rash.  Neurological:     Mental Status: She is alert and oriented to person, place, and time.     Sensory: Sensation is intact.     Motor: Motor function is intact.     Coordination: Coordination is intact.     Deep Tendon Reflexes: Reflexes are normal and symmetric.  Psychiatric:        Behavior: Behavior normal.        Thought Content: Thought content normal.     Wt Readings from Last 3 Encounters:  10/09/18 204 lb (92.5 kg)  08/08/18 207 lb 0.2 oz (93.9 kg)  07/09/18 210 lb 9.6 oz (95.5 kg)    BP 140/80   Pulse 60   Ht 5\' 7"  (1.702 m)   Wt 204 lb (92.5 kg)   SpO2 96%   BMI 31.95 kg/m   Assessment and Plan: 1. Hyperlipidemia associated with type 2 diabetes mellitus ([place]) Check labs and likely change medication to pravachol - Comprehensive metabolic panel - Lipid panel  2. Type II diabetes mellitus with complication ([facility]) Change to ER metformin due to diarrhea - Hemoglobin A1c - metFORMIN (GLUCOPHAGE-XR) 500 MG 24 hr tablet; Take 1 tablet (500 mg total) by mouth daily with breakfast.  Dispense: 90 tablet; Refill: 3  3. Essential (primary) hypertension controlled - amLODipine (NORVASC) 10 MG tablet; Take 1 tablet (10 mg total) by mouth daily.  Dispense: 90 tablet; Refill: 3 - lisinopril (ZESTRIL) 40 MG tablet; Take 1 tablet (40 mg total) by mouth daily.  Dispense: 90 tablet; Refill: 3 - triamterene-hydrochlorothiazide (MAXZIDE-25) 37.5-25 MG tablet; Take 1 tablet by mouth daily.  Dispense: 90 tablet; Refill: 3  4. Vertigo - Ambulatory referral to ENT  5. Acute midline low back pain without sciatica Handicapped parking application given - DG Lumbar Spine Complete; Future  Counseled regarding grief - she will call or return if sx are worsening.  Partially dictated using Editor, commissioning. Any errors are unintentional.   Halina Maidens, MD [facility]  10/09/2018

## 2018-11-01 DIAGNOSIS — B351 Tinea unguium: Secondary | ICD-10-CM | POA: Diagnosis not present

## 2018-11-01 DIAGNOSIS — E119 Type 2 diabetes mellitus without complications: Secondary | ICD-10-CM | POA: Diagnosis not present

## 2018-11-01 DIAGNOSIS — L851 Acquired keratosis [keratoderma] palmaris et plantaris: Secondary | ICD-10-CM | POA: Diagnosis not present

## 2018-11-01 DIAGNOSIS — M79671 Pain in right foot: Secondary | ICD-10-CM | POA: Diagnosis not present

## 2018-11-01 DIAGNOSIS — M79672 Pain in left foot: Secondary | ICD-10-CM | POA: Diagnosis not present

## 2018-11-09 ENCOUNTER — Telehealth: Payer: Self-pay | Admitting: *Deleted

## 2018-11-09 NOTE — Telephone Encounter (Signed)
-----   Message from Secundino Ginger sent at 11/09/2018 11:21 AM EDT ----- Regarding: PAIN Contact: [phone] I CAME IN TODAY TO FINISH SCHEDULES FOR NEXT WEEK, THIS LADY HAD LEFT A MSG THAT HER BACK WAS KILLING HER. SHE IS A [facility] PATIENT. NOT SURE WHAT TO DO SINCE WE ARE CLOSED.

## 2018-11-09 NOTE — Telephone Encounter (Signed)
Patient reports that she has had an xray by her PCP for this last month and that she was told she has arthritis in her low back. She said she needs an injection in or something. I advised she needs to call her PCP or go to ER for evaluation. She said her PCP told her to take Tylenol and that is not helping. She said she will probably go to ER.  CLINICAL DATA:  Low back pain  EXAM: LUMBAR SPINE - COMPLETE 4+ VIEW  COMPARISON:  CT and pelvis dated 09/19 2017  FINDINGS: Multiple phleboliths project over the patient's pelvis. Degenerative changes are noted of the lumbar spine, greatest at the lower lumbar segments. There is no displaced fracture. The alignment appears intact. Multilevel facet arthrosis is noted, greatest in the lower lumbar segments.  IMPRESSION: 1. No acute osseous abnormality. 2. Mild multilevel degenerative changes of the lumbar spine, greatest at the lower lumbar segments.   Electronically Signed   By: Constance Holster M.D.   On: 10/09/2018 13:49

## 2018-11-22 ENCOUNTER — Other Ambulatory Visit: Payer: Self-pay | Admitting: Internal Medicine

## 2018-11-22 ENCOUNTER — Other Ambulatory Visit: Payer: Self-pay

## 2018-11-22 ENCOUNTER — Telehealth: Payer: Self-pay

## 2018-11-22 DIAGNOSIS — M47817 Spondylosis without myelopathy or radiculopathy, lumbosacral region: Secondary | ICD-10-CM | POA: Diagnosis not present

## 2018-11-22 DIAGNOSIS — M545 Low back pain, unspecified: Secondary | ICD-10-CM

## 2018-11-22 NOTE — Telephone Encounter (Signed)
She needs to see Ortho.  I will send in a referral.

## 2018-11-22 NOTE — Telephone Encounter (Signed)
Patient called saying she is still having severe pain in her back. Its gotten no better. She said "I need a shot or something."  Please Advise. I know [facility] does Torodal shots if needed. Do you want to send to ortho?

## 2018-11-23 ENCOUNTER — Ambulatory Visit: Payer: Medicare Other | Admitting: Radiation Oncology

## 2018-11-23 NOTE — Telephone Encounter (Signed)
Spoke with pt. She went to the walk in [facility] in [place]. They gave her a pain medicine shot and they are going to do a MRI because they think something else is going on.   She is begin tested for Covid today because her son tested positive yesterday. Told her to stay well and stay quarantined to be safe.

## 2018-12-03 DIAGNOSIS — M47817 Spondylosis without myelopathy or radiculopathy, lumbosacral region: Secondary | ICD-10-CM | POA: Diagnosis not present

## 2018-12-14 ENCOUNTER — Telehealth: Payer: Self-pay

## 2018-12-14 ENCOUNTER — Other Ambulatory Visit: Payer: Self-pay | Admitting: Hematology and Oncology

## 2018-12-14 NOTE — Telephone Encounter (Signed)
Contacted Dr. Elinor Parkinson' office and spoke with Janett Billow to make aware that we will need a new dental clearance form once patient has healed from her oral surgery. Office requested that I fax over the request stating this information as well, this was completed.

## 2018-12-20 ENCOUNTER — Ambulatory Visit
Admission: RE | Admit: 2018-12-20 | Discharge: 2018-12-20 | Disposition: A | Discharge status: Home / Self Care | Payer: Medicare Other | Source: Ambulatory Visit | Attending: Radiation Oncology | Admitting: Radiation Oncology

## 2018-12-20 ENCOUNTER — Encounter: Payer: Self-pay | Admitting: Radiation Oncology

## 2018-12-20 ENCOUNTER — Other Ambulatory Visit: Payer: Self-pay

## 2018-12-20 VITALS — BP 199/88 | HR 81 | Temp 96.4°F | Resp 18 | Wt 204.6 lb

## 2018-12-20 DIAGNOSIS — Z923 Personal history of irradiation: Secondary | ICD-10-CM | POA: Insufficient documentation

## 2018-12-20 DIAGNOSIS — M549 Dorsalgia, unspecified: Secondary | ICD-10-CM | POA: Insufficient documentation

## 2018-12-20 DIAGNOSIS — Z170 Estrogen receptor positive status [ER+]: Secondary | ICD-10-CM | POA: Insufficient documentation

## 2018-12-20 DIAGNOSIS — Z79811 Long term (current) use of aromatase inhibitors: Secondary | ICD-10-CM | POA: Diagnosis not present

## 2018-12-20 DIAGNOSIS — C50912 Malignant neoplasm of unspecified site of left female breast: Secondary | ICD-10-CM

## 2018-12-20 DIAGNOSIS — C50212 Malignant neoplasm of upper-inner quadrant of left female breast: Secondary | ICD-10-CM | POA: Insufficient documentation

## 2018-12-20 NOTE — Progress Notes (Signed)
Radiation Oncology Follow up Note  Name: Donna Riley   Date:   12/20/2018 MRN:  [id number] DOB: [birth year]    This 58 y.o. female presents to the clinic today for 3-1/2-year follow-up status post whole breast radiation to her left breast for stage IIa invasive mammary carcinoma.  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  She is seen today in routine follow-up and is doing well.  She specifically denies breast tenderness cough or bone pain.  Her major complaint is back pain she recently had an MRI scan which is not available for my review although that is being followed up by orthopedics.  She is currently arimadex tolerating that well without side effect.  She did have a plain film of her back which I have reviewed shows arthritic changes no evidence to suggest metastatic disease.  Last mammogram was back in April was BI-RADS 2 benign which I have reviewed.  COMPLICATIONS OF TREATMENT: none  FOLLOW UP COMPLIANCE: keeps appointments   PHYSICAL EXAM:  BP (!) 199/88   Pulse 81   Temp (!) 96.4 F (35.8 C)   Resp 18   Wt 204 lb 9.4 oz (92.8 kg)   BMI 32.04 kg/m  Lungs are clear to A&P cardiac examination essentially unremarkable with regular rate and rhythm. No dominant mass or nodularity is noted in either breast in 2 positions examined. Incision is well-healed. No axillary or supraclavicular adenopathy is appreciated. Cosmetic result is excellent.  Well-developed well-nourished patient in NAD. HEENT reveals PERLA, EOMI, discs not visualized.  Oral cavity is clear. No oral mucosal lesions are identified. Neck is clear without evidence of cervical or supraclavicular adenopathy. Lungs are clear to A&P. Cardiac examination is essentially unremarkable with regular rate and rhythm without murmur rub or thrill. Abdomen is  benign with no organomegaly or masses noted. Motor sensory and DTR levels are equal and symmetric in the upper and lower extremities. Cranial nerves II through XII are grossly intact. Proprioception is intact. No peripheral adenopathy or edema is identified. No motor or sensory levels are noted. Crude visual fields are within normal range.  RADIOLOGY RESULTS: Mammograms reviewed compatible with above-stated findings  PLAN: Present time she continues to do well.  She will be worked up for back although I am sure certain this is not related to her breast cancer.  I have asked to see her back in 1 year and then will discontinue follow-up care.  Patient knows to call with any concerns at any time.  I would like to take this opportunity to thank you for allowing me to participate in the care of your patient.[name], MD

## 2019-01-03 ENCOUNTER — Other Ambulatory Visit: Payer: Self-pay | Admitting: Hematology and Oncology

## 2019-01-03 DIAGNOSIS — M545 Low back pain: Secondary | ICD-10-CM | POA: Diagnosis not present

## 2019-01-03 DIAGNOSIS — M47817 Spondylosis without myelopathy or radiculopathy, lumbosacral region: Secondary | ICD-10-CM | POA: Diagnosis not present

## 2019-01-08 ENCOUNTER — Other Ambulatory Visit: Payer: Self-pay

## 2019-01-08 MED ORDER — ANASTROZOLE 1 MG PO TABS: 1.0000 mg | ORAL_TABLET | Freq: Every day | ORAL | 0 refills | Status: DC

## 2019-01-22 DIAGNOSIS — L851 Acquired keratosis [keratoderma] palmaris et plantaris: Secondary | ICD-10-CM | POA: Diagnosis not present

## 2019-01-22 DIAGNOSIS — B353 Tinea pedis: Secondary | ICD-10-CM | POA: Diagnosis not present

## 2019-01-22 DIAGNOSIS — E119 Type 2 diabetes mellitus without complications: Secondary | ICD-10-CM | POA: Diagnosis not present

## 2019-02-07 NOTE — Progress Notes (Signed)
[facility]  [address] [place], [place] [address] Phone: [phone]  Fax: [phone]   Clinic Day:  02/08/2019  Referring physician: Glean Hess, MD  Chief Complaint: CLEMIE GENERAL is a 58 y.o. female with stage IIA left breast cancer who is seen for a 23-monthassessment.  HPI: The patient was last seen in the medical oncology clinic on 08/08/2018 by BHonor Loh NP. At that time, patient was doing well overall. She continued to grieve appropriately following the loss of her husband in January. Patient was having difficulties with her sleep.  She had increased muscle tension in her neck and shoulders related to increased stress. She complained of intermittent tenderness and "shooting pains" in her left breast and axilla.  She denied any B symptoms or interval infections. Exam revealed minimal tenderness in the upper outer quadrant of the left  breast extending into the axilla.  There were fibrocystic changes noted in the right breast.  She continued Arimidex.  Bilateral diagnostic mammogram on 09/17/2018 revealed no evidence of malignancy in either breast. There were expected post-lumpectomy changes involving the left breast.   She was seen by Dr. CBaruch Goutyin radiation oncology on 12/20/2018. There was no evidence of recurrence and annual follow-up was scheduled.   During the interim, she has been doing "all right".  She notes no appetite.  She still has some shooting pains in her breast.  She has some lower back pain for which she is seeing [facility].  She states that her MRI showed "inflammation"; she is receiving injections.  She continues to have right arm discomfort (old).  Steroids help.  She notes some sweats which she thinks may be due to diabetes.   Past Medical History:  Diagnosis Date  . Arthritis    shoulders and neck  . Breast cancer [place]    Left breast with chemo + rad tx's with lumpectomy.  . Breast wound, left,  sequela 06/23/2016  . Diabetes mellitus without complication ([facility] 27/3710 . Elevated LFTs   . Fatigue 08/01/2016  . Gout   . Hydradenitis 01/23/2015  . Hyperlipidemia   . Hypertension   . Mastitis 10/15/2016  . Personal history of chemotherapy   . Personal history of chemotherapy   . Personal history of radiation therapy 2016   LEFT lumpectomy  . Personal history of radiation therapy   . Positive PPD, treated   . Shortness of breath dyspnea    with exertion    Past Surgical History:  Procedure Laterality Date  . BREAST BIOPSY Left 07/31/2014   positive. Lumpectomy 08/27/2014 with chemo and rad  . BREAST BIOPSY Left 03/08/2016   INTRADUCTAL PAPILLOMA WITH SCLEROSIS AND CALCIFICATIONS  . BREAST BIOPSY Left 03/08/2016   FAT NECROSIS WITH FIBROSIS  . 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 BREAST LUMPECTOMY WITH NEEDLE LOCALIZATION Left 04/05/2016   Procedure: BREAST LUMPECTOMY WITH NEEDLE LOCALIZATION;  Surgeon: CHubbard Robinson MD;  Location: [facility];  Service: General;  Laterality: Left;  . BREAST LUMPECTOMY WITH NEEDLE LOCALIZATION AND AXILLARY SENTINEL LYMPH NODE BX Left 08/27/14  . CHOLECYSTECTOMY  09/07/2013  . COLONOSCOPY WITH PROPOFOL N/A 08/28/2015   Procedure: COLONOSCOPY WITH PROPOFOL;  Surgeon: DLucilla Lame MD;  [facility]  Service: Endoscopy;  Laterality: N/A;  Diabetic oral  . ESOPHAGOGASTRODUODENOSCOPY  2014   gastritis  . POLYPECTOMY  08/28/2015   Procedure: POLYPECTOMY INTESTINAL;  Surgeon: DLucilla Lame MD;  [facility];  Service: Endoscopy;;  Rectal polyp  . TUBAL LIGATION Bilateral   . UMBILICAL HERNIA REPAIR N/A 08/09/2016   Procedure: HERNIA REPAIR UMBILICAL ADULT;  Surgeon: Olean Ree, MD;  Location: [facility];  Service: General;  Laterality: N/A;  . VENTRAL HERNIA REPAIR N/A 04/10/2015   Procedure: HERNIA REPAIR VENTRAL ADULT;  Surgeon: Leonie Green, MD;  Location: [facility];  Service:  General;  Laterality: N/A;    Family History  Problem Relation Age of Onset  . Cancer Mother        Pancreatic  . Cancer Father        Throat  . Healthy Sister   . HIV Brother   . Cancer Maternal Grandmother        Breast  . Breast cancer Maternal Grandmother 75    Social History:  reports that she has been smoking cigarettes. She has a 19.00 pack-year smoking history. She has never used smokeless tobacco. She reports current alcohol use of about 2.0 standard drinks of alcohol per week. She reports that she does not use drugs.  She lost her husband in 05/2018.  She lives in [place].  The patient is alone today.  Allergies: No Known Allergies  Current Medications: Current Outpatient Medications  Medication Sig Dispense Refill  . acetaminophen (TYLENOL) 325 MG tablet Take 650 mg by mouth daily as needed.    Marland Kitchen amLODipine (NORVASC) 10 MG tablet Take 1 tablet (10 mg total) by mouth daily. 90 tablet 3  . aspirin EC 81 MG tablet Take 81 mg by mouth daily.    Marland Kitchen atorvastatin (LIPITOR) 10 MG tablet Take 1 tablet (10 mg total) by mouth daily. 90 tablet 1  . Blood Glucose Calibration (TRUE METRIX LEVEL 1) Low SOLN 1 each by In Vitro route daily as needed. 1 each 3  . Blood Glucose Monitoring Suppl (TRUE METRIX METER) DEVI 1 each by Does not apply route daily. 100 Device 3  . colchicine 0.6 MG tablet Take 1 tablet (0.6 mg total) by mouth 2 (two) times daily. Up to 5 days as needed 30 tablet 1  . lisinopril (ZESTRIL) 40 MG tablet Take 1 tablet (40 mg total) by mouth daily. 90 tablet 3  . metFORMIN (GLUCOPHAGE-XR) 500 MG 24 hr tablet Take 1 tablet (500 mg total) by mouth daily with breakfast. 90 tablet 3  . naproxen sodium (ALEVE) 220 MG tablet Take 220 mg by mouth.    . triamterene-hydrochlorothiazide (MAXZIDE-25) 37.5-25 MG tablet Take 1 tablet by mouth daily. 90 tablet 3  . Alcohol Swabs (B-D SINGLE USE SWABS REGULAR) PADS 1 each by Does not apply route 2 (two) times daily. (Patient not taking:  Reported on 02/08/2019) 100 each 3  . anastrozole (ARIMIDEX) 1 MG tablet Take 1 tablet (1 mg total) by mouth daily. 30 tablet 0   No current facility-administered medications for this visit.     Review of Systems  Constitutional: Positive for weight loss (3 pounds). Negative for chills, diaphoresis, fever and malaise/fatigue.       Feels "all right".  HENT: Negative.  Negative for congestion, ear pain, nosebleeds, sinus pain and sore throat.   Eyes: Negative.  Negative for blurred vision and double vision.  Respiratory: Negative.  Negative for cough, sputum production, shortness of breath and wheezing.   Cardiovascular: Negative.  Negative for chest pain, palpitations, orthopnea, leg swelling and PND.  Gastrointestinal: Negative for abdominal pain, blood in stool, constipation, diarrhea, melena, nausea and vomiting.  Gastroparesis.  Genitourinary: Negative for dysuria, frequency, hematuria and urgency.  Musculoskeletal: Positive for back pain (lower back seeing [facility]). Negative for falls, joint pain, myalgias and neck pain.       BILATERAL shoulder pain.  Intermittent shooting pains in breast.  Skin: Negative.  Negative for itching and rash.  Neurological: Negative.  Negative for dizziness, tremors, sensory change, focal weakness, weakness and headaches.  Endo/Heme/Allergies: Negative.  Does not bruise/bleed easily.  Psychiatric/Behavioral: Negative for depression and memory loss. The patient is not nervous/anxious and does not have insomnia.   All other systems reviewed and are negative.  Performance status (ECOG): 1  Vitals Blood pressure (!) 149/85, pulse 81, temperature (!) 96.5 F (35.8 C), temperature source Tympanic, resp. rate 18, height _0  (1.702 m), weight 204 lb 11.2 oz (92.8 kg), SpO2 100 %.   Physical Exam  Constitutional: She is oriented to person, place, and time. She appears well-developed and well-nourished. No distress.  HENT:  Head: Normocephalic and  atraumatic.  Mouth/Throat: Oropharynx is clear and moist. No oropharyngeal exudate.  Shoulder length brown and red hair.  Mask.  Eyes: Pupils are equal, round, and reactive to light. Conjunctivae and EOM are normal. No scleral icterus.  Brown eyes.  Neck: Normal range of motion. Neck supple. No JVD present.  Cardiovascular: Normal rate, regular rhythm, normal heart sounds and normal pulses.  No murmur heard. Pulmonary/Chest: Effort normal and breath sounds normal. No respiratory distress. She has no wheezes. She has no rhonchi. She has no rales. Right breast exhibits no inverted nipple, no mass, no nipple discharge, no skin change (scattered tender fibrocystic changes) and no tenderness. Left breast exhibits tenderness (upper outer quadrant extending into axilla). Left breast exhibits no inverted nipple, no mass, no nipple discharge and no skin change (s/p lumpectomy).  Abdominal: Soft. Bowel sounds are normal. She exhibits no distension and no mass. There is no abdominal tenderness. There is no rebound and no guarding.  Musculoskeletal: Normal range of motion.        General: No edema.     Right lower leg: No edema.     Left lower leg: No edema.  Lymphadenopathy:    She has no cervical adenopathy.    She has no axillary adenopathy.       Right: No supraclavicular adenopathy present.       Left: No supraclavicular adenopathy present.  Neurological: She is alert and oriented to person, place, and time. She has normal reflexes.  Skin: Skin is warm and dry. No rash noted. She is not diaphoretic. No erythema. No pallor.  Psychiatric: She has a normal mood and affect. Her behavior is normal. Judgment and thought content normal.  Nursing note and vitals reviewed.   Appointment on 02/08/2019  Component Date Value Ref Range Status  . CA 27.29 02/08/2019 8.8  0.0 - 38.6 U/mL Final   Comment: (NOTE) Siemens Centaur Immunochemiluminometric Methodology [facility]) Values obtained with different assay  methods or kits cannot be used interchangeably. Results cannot be interpreted as absolute evidence of the presence or absence of malignant disease. Performed At: [facility] [place], [place] [id number] Rush Farmer MD FU:[id number]   . Sodium 02/08/2019 138  135 - 145 mmol/L Final  . Potassium 02/08/2019 3.7  3.5 - 5.1 mmol/L Final  . Chloride 02/08/2019 104  98 - 111 mmol/L Final  . CO2 02/08/2019 25  22 - 32 mmol/L Final  . Glucose, Bld 02/08/2019 102* 70 - 99 mg/dL Final  .  BUN 02/08/2019 9  6 - 20 mg/dL Final  . Creatinine, Ser 02/08/2019 0.88  0.44 - 1.00 mg/dL Final  . Calcium 02/08/2019 9.3  8.9 - 10.3 mg/dL Final  . Total Protein 02/08/2019 8.0  6.5 - 8.1 g/dL Final  . Albumin 02/08/2019 4.2  3.5 - 5.0 g/dL Final  . AST 02/08/2019 23  15 - 41 U/L Final  . ALT 02/08/2019 30  0 - 44 U/L Final  . Alkaline Phosphatase 02/08/2019 94  38 - 126 U/L Final  . Total Bilirubin 02/08/2019 0.6  0.3 - 1.2 mg/dL Final  . GFR calc non Af Amer 02/08/2019 >60  >60 mL/min Final  . GFR calc Af Amer 02/08/2019 >60  >60 mL/min Final  . Anion gap 02/08/2019 9  5 - 15 Final   Performed at [facility], [address] [place], [place] [address]  . WBC 02/08/2019 7.9  4.0 - 10.5 K/uL Final  . RBC 02/08/2019 5.22* 3.87 - 5.11 MIL/uL Final  . Hemoglobin 02/08/2019 14.3  12.0 - 15.0 g/dL Final  . HCT 02/08/2019 44.4  36.0 - 46.0 % Final  . MCV 02/08/2019 85.1  80.0 - 100.0 fL Final  . MCH 02/08/2019 27.4  26.0 - 34.0 pg Final  . MCHC 02/08/2019 32.2  30.0 - 36.0 g/dL Final  . RDW 02/08/2019 14.1  11.5 - 15.5 % Final  . Platelets 02/08/2019 225  150 - 400 K/uL Final  . nRBC 02/08/2019 0.0  0.0 - 0.2 % Final  . Neutrophils Relative % 02/08/2019 53  % Final  . Neutro Abs 02/08/2019 4.2  1.7 - 7.7 K/uL Final  . Lymphocytes Relative 02/08/2019 39  % Final  . Lymphs Abs 02/08/2019 3.1  0.7 - 4.0 K/uL Final  . Monocytes Relative 02/08/2019 6  % Final  . Monocytes  Absolute 02/08/2019 0.5  0.1 - 1.0 K/uL Final  . Eosinophils Relative 02/08/2019 1  % Final  . Eosinophils Absolute 02/08/2019 0.1  0.0 - 0.5 K/uL Final  . Basophils Relative 02/08/2019 1  % Final  . Basophils Absolute 02/08/2019 0.1  0.0 - 0.1 K/uL Final  . Immature Granulocytes 02/08/2019 0  % Final  . Abs Immature Granulocytes 02/08/2019 0.02  0.00 - 0.07 K/uL Final   Performed at [facility], [address] [place], [place] [address]    Assessment:  NYSHA KOPLIN is a 58 y.o. female with stage IIA left breast cancer status post partial mastectomy and sentinel lymph node biopsy on 08/27/2014. 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 There was lymphovascular invasion. One of 2 sentinel lymph nodes were positive (focus of 2.8 mm). Tumor was > 90% ER positive, > 90% PR positive, and Her2/neu negative. Pathologic stage was [id number].  Bone scan on 09/16/2014 revealed abnormal focal uptake at the level of right L3 pedicle and L5 vertebral body. Lumbar spine MRI on 09/27/2014 revealed no evidence of metastatic disease with lower lumbar facet arthritis. Abdominal and pelvic CT scan on 09/24/2014 revealed hepatomegaly and no evidence of metastatic disease.   She received 4 cycles of Taxotere and Cytoxan (09/29/2014 - 12/09/2014) with Neulasta support. She received 50.4 Gy to the left breast from 01/12/2015 until 02/23/2015.  She was started on Femara on 03/12/2015, but switched to Arimidex on 03/30/2015 secondary to diffuse joint aches.    CA27.29 has been followed: 16.9 on 09/09/2014, 12.1 on 06/02/2015, 15.6 on  08/07/2015, 17.4 on 11/23/2015, 13.6 on 02/22/2016, 18.9 on 06/20/2016, 18.4 on 10/21/2016, 15 on 02/23/2017, 14.1 on 06/26/2017, 15.6 on 12/25/2017, 12.4 on 08/08/2018, and 8.8 on 02/08/2019.  Bilateral diagnostic mammogram on 07/30/2015 revealed no evidence of malignancy.  Left sided mammogram  and ultrasound on 02/29/2016 revealed a 1.5 x 0.6 x 0.7 cm intraductal mass.  Bilateral mammogram on 09/12/2016 revealed no focal fluid collection at the surgical site to account for the persistent drainage. There were no suspicious mammographic or targeted sonographic abnormalities at the lumpectomy site. There was no evidence of malignancy in the bilateral breasts.   Bilateral mammogram on 09/14/2017 that revealed maturing fat necrosis at the LEFT lumpectomy site. There was no evidence of malignancy in either breast.  Left diagnostic mammogram and ultrasound on 01/01/2018 revealed no significant abnormality other than a single normal appearing lymph node over the axillary tail portion of the left breast in the area of palpable abnormality and tenderness.  Bilateral diagnostic mammogram on 09/17/2018 revealed no evidence of malignancy in either breast. There were expected post-lumpectomy changes involving the left breast.   She had chronic left nipple discharge.  Left breast lumpectomy at the 11 o'clock position on 04/05/2016 revealed an intraductal papilloma with sclerosis and calcifications. There was fat necrosis with fibrosis and calcifications. Pathology was negative for atypia and malignancy  Colonoscopy on 08/28/2015 revealed one 4 mm polyp in the rectum (tubular adenoma).  Bone density study on 09/15/2014 revealed osteopenia with a T-score of -2.1 at L1-L4.  Bone density on 01/17/2018 revealed osteoporosis with a T-score of -2.6 in the AP spine L1-L4.  She is on calcium and vitamin D.  Symptomatically, she has intermittent shooting pain in her breat.  Exam is stable.  Plan: 1.   Labs today: CBC with diff, CMP, CA 27.29. 2.   Stage IIA LEFT breast cancer Clinically, she is doing well. Review mammogram on 09/17/2018- no evidence of malignancy. Schedule bilateral mammogram on 09/17/2019. Discuss BCI testing regarding extended adjuvant endocrine therapy.  Information provided. Continue  Arimidex. 3.   Osteoporosis  Continue calcium and vitamin D.  Patient notes waiting for insurance regarding dental evaluation and clearance for Prolia. 4.   Gastroparesis  Discuss prior conversation regarding GI evaluation.   EGD had been recommended.  Note to Dr Bonna Gains. 5.   RTC in 6 months for MD assessment and labs (CBC with diff, CMP, CA27.29).  I discussed the assessment and treatment plan with the patient.  The patient was provided an opportunity to ask questions and all were answered.  The patient agreed with the plan and demonstrated an understanding of the instructions.  The patient was advised to call back if the symptoms worsen or if the condition fails to improve as anticipated.   Lequita Asal, MD, PhD    9/112020, 3:35 PM

## 2019-02-08 ENCOUNTER — Other Ambulatory Visit: Payer: Self-pay

## 2019-02-08 ENCOUNTER — Inpatient Hospital Stay: Payer: Medicare Other

## 2019-02-08 ENCOUNTER — Encounter: Payer: Self-pay | Admitting: Hematology and Oncology

## 2019-02-08 ENCOUNTER — Inpatient Hospital Stay: Payer: Medicare Other | Attending: Hematology and Oncology | Admitting: Hematology and Oncology

## 2019-02-08 VITALS — BP 149/85 | HR 81 | Temp 96.5°F | Resp 18 | Ht 67.0 in | Wt 204.7 lb

## 2019-02-08 DIAGNOSIS — C50612 Malignant neoplasm of axillary tail of left female breast: Secondary | ICD-10-CM | POA: Diagnosis not present

## 2019-02-08 DIAGNOSIS — Z7982 Long term (current) use of aspirin: Secondary | ICD-10-CM | POA: Diagnosis not present

## 2019-02-08 DIAGNOSIS — Z803 Family history of malignant neoplasm of breast: Secondary | ICD-10-CM | POA: Diagnosis not present

## 2019-02-08 DIAGNOSIS — F17210 Nicotine dependence, cigarettes, uncomplicated: Secondary | ICD-10-CM | POA: Diagnosis not present

## 2019-02-08 DIAGNOSIS — E119 Type 2 diabetes mellitus without complications: Secondary | ICD-10-CM | POA: Diagnosis not present

## 2019-02-08 DIAGNOSIS — Z79899 Other long term (current) drug therapy: Secondary | ICD-10-CM | POA: Insufficient documentation

## 2019-02-08 DIAGNOSIS — Z170 Estrogen receptor positive status [ER+]: Secondary | ICD-10-CM | POA: Diagnosis not present

## 2019-02-08 DIAGNOSIS — Z853 Personal history of malignant neoplasm of breast: Secondary | ICD-10-CM

## 2019-02-08 DIAGNOSIS — Z9221 Personal history of antineoplastic chemotherapy: Secondary | ICD-10-CM | POA: Insufficient documentation

## 2019-02-08 DIAGNOSIS — Z800 Family history of malignant neoplasm of digestive organs: Secondary | ICD-10-CM | POA: Diagnosis not present

## 2019-02-08 DIAGNOSIS — Z801 Family history of malignant neoplasm of trachea, bronchus and lung: Secondary | ICD-10-CM | POA: Diagnosis not present

## 2019-02-08 DIAGNOSIS — C50912 Malignant neoplasm of unspecified site of left female breast: Secondary | ICD-10-CM | POA: Diagnosis not present

## 2019-02-08 DIAGNOSIS — I10 Essential (primary) hypertension: Secondary | ICD-10-CM | POA: Diagnosis not present

## 2019-02-08 DIAGNOSIS — Z7984 Long term (current) use of oral hypoglycemic drugs: Secondary | ICD-10-CM | POA: Insufficient documentation

## 2019-02-08 DIAGNOSIS — E785 Hyperlipidemia, unspecified: Secondary | ICD-10-CM | POA: Insufficient documentation

## 2019-02-08 DIAGNOSIS — M810 Age-related osteoporosis without current pathological fracture: Secondary | ICD-10-CM | POA: Diagnosis not present

## 2019-02-08 DIAGNOSIS — Z79811 Long term (current) use of aromatase inhibitors: Secondary | ICD-10-CM | POA: Diagnosis not present

## 2019-02-08 DIAGNOSIS — Z923 Personal history of irradiation: Secondary | ICD-10-CM | POA: Insufficient documentation

## 2019-02-08 LAB — COMPREHENSIVE METABOLIC PANEL
ALT: 30 U/L (ref 0–44)
AST: 23 U/L (ref 15–41)
Albumin: 4.2 g/dL (ref 3.5–5.0)
Alkaline Phosphatase: 94 U/L (ref 38–126)
Anion gap: 9 (ref 5–15)
BUN: 9 mg/dL (ref 6–20)
CO2: 25 mmol/L (ref 22–32)
Calcium: 9.3 mg/dL (ref 8.9–10.3)
Chloride: 104 mmol/L (ref 98–111)
Creatinine, Ser: 0.88 mg/dL (ref 0.44–1.00)
GFR calc Af Amer: >60 mL/min (ref >60)
GFR calc non Af Amer: >60 mL/min (ref >60)
Glucose, Bld: 102 mg/dL — ABNORMAL HIGH (ref 70–99)
Potassium: 3.7 mmol/L (ref 3.5–5.1)
Sodium: 138 mmol/L (ref 135–145)
Total Bilirubin: 0.6 mg/dL (ref 0.3–1.2)
Total Protein: 8 g/dL (ref 6.5–8.1)

## 2019-02-08 LAB — CBC WITH DIFFERENTIAL/PLATELET
Abs Immature Granulocytes: 0.02 10*3/uL (ref 0.00–0.07)
Basophils Absolute: 0.1 10*3/uL (ref 0.0–0.1)
Basophils Relative: 1 %
Eosinophils Absolute: 0.1 10*3/uL (ref 0.0–0.5)
Eosinophils Relative: 1 %
HCT: 44.4 % (ref 36.0–46.0)
Hemoglobin: 14.3 g/dL (ref 12.0–15.0)
Immature Granulocytes: 0 %
Lymphocytes Relative: 39 %
Lymphs Abs: 3.1 10*3/uL (ref 0.7–4.0)
MCH: 27.4 pg (ref 26.0–34.0)
MCHC: 32.2 g/dL (ref 30.0–36.0)
MCV: 85.1 fL (ref 80.0–100.0)
Monocytes Absolute: 0.5 10*3/uL (ref 0.1–1.0)
Monocytes Relative: 6 %
Neutro Abs: 4.2 10*3/uL (ref 1.7–7.7)
Neutrophils Relative %: 53 %
Platelets: 225 10*3/uL (ref 150–400)
RBC: 5.22 MIL/uL — ABNORMAL HIGH (ref 3.87–5.11)
RDW: 14.1 % (ref 11.5–15.5)
WBC: 7.9 10*3/uL (ref 4.0–10.5)
nRBC: 0 % (ref 0.0–0.2)

## 2019-02-08 NOTE — Progress Notes (Signed)
The patient c/o right arm pain ( pain level 10) , also lower back pain ( pain level 7)

## 2019-02-09 LAB — CANCER ANTIGEN 27.29: CA 27.29: 8.8 U/mL (ref 0.0–38.6)

## 2019-02-11 ENCOUNTER — Other Ambulatory Visit: Payer: Self-pay

## 2019-02-11 ENCOUNTER — Telehealth: Payer: Self-pay | Admitting: Hematology and Oncology

## 2019-02-11 MED ORDER — ANASTROZOLE 1 MG PO TABS: 1.0000 mg | ORAL_TABLET | Freq: Every day | ORAL | 0 refills | Status: DC

## 2019-02-11 NOTE — Telephone Encounter (Signed)
Donna Riley called and needs her Anastrozole refilled. She uses [facility] mail service she said.

## 2019-02-18 ENCOUNTER — Other Ambulatory Visit: Payer: Self-pay

## 2019-02-18 ENCOUNTER — Encounter: Payer: Self-pay | Admitting: Internal Medicine

## 2019-02-18 ENCOUNTER — Ambulatory Visit (INDEPENDENT_AMBULATORY_CARE_PROVIDER_SITE_OTHER): Payer: Medicare Other | Admitting: Internal Medicine

## 2019-02-18 ENCOUNTER — Other Ambulatory Visit
Admission: RE | Admit: 2019-02-18 | Discharge: 2019-02-18 | Disposition: A | Discharge status: Home / Self Care | Payer: Medicare Other | Attending: Internal Medicine | Admitting: Internal Medicine

## 2019-02-18 VITALS — BP 150/78 | HR 84 | Ht 67.0 in | Wt 203.0 lb

## 2019-02-18 DIAGNOSIS — E1169 Type 2 diabetes mellitus with other specified complication: Secondary | ICD-10-CM | POA: Diagnosis not present

## 2019-02-18 DIAGNOSIS — C773 Secondary and unspecified malignant neoplasm of axilla and upper limb lymph nodes: Secondary | ICD-10-CM | POA: Diagnosis not present

## 2019-02-18 DIAGNOSIS — I10 Essential (primary) hypertension: Secondary | ICD-10-CM

## 2019-02-18 DIAGNOSIS — E118 Type 2 diabetes mellitus with unspecified complications: Secondary | ICD-10-CM | POA: Insufficient documentation

## 2019-02-18 DIAGNOSIS — E785 Hyperlipidemia, unspecified: Secondary | ICD-10-CM

## 2019-02-18 DIAGNOSIS — Z23 Encounter for immunization: Secondary | ICD-10-CM

## 2019-02-18 DIAGNOSIS — S29011A Strain of muscle and tendon of front wall of thorax, initial encounter: Secondary | ICD-10-CM

## 2019-02-18 LAB — HEMOGLOBIN A1C
Hgb A1c MFr Bld: 6.2 % — ABNORMAL HIGH (ref 4.8–5.6)
Mean Plasma Glucose: 131.24 mg/dL

## 2019-02-18 MED ORDER — IRBESARTAN 300 MG PO TABS: 300.0000 mg | ORAL_TABLET | Freq: Every day | ORAL | 1 refills | Status: DC

## 2019-02-18 NOTE — Patient Instructions (Addendum)
Stop Lisinopril once the Irbesartan arrives - start that one once a day in place of lisinopril.  I will follow up with your BP at your next visit.

## 2019-02-18 NOTE — Progress Notes (Signed)
Date:  02/18/2019   Name:  Donna Riley   DOB:  1960-12-02   MRN:  [id number]   Chief Complaint: Diabetes (Follow up. Foot exam. Reg dose flu shot.), Hypertension, and Flank Pain (Right side pain. Started Saturday- 02-16-2019. Unsure of how she has injuried herself. Says it feels like " I pulled a muscle. ")  Diabetes She presents for her follow-up diabetic visit. She has type 2 diabetes mellitus. Her disease course has been stable. Pertinent negatives for hypoglycemia include no headaches or tremors. Pertinent negatives for diabetes include no chest pain, no fatigue, no polydipsia and no polyuria. There are no hypoglycemic complications. Symptoms are stable. Current diabetic treatment includes oral agent (monotherapy) (metformin). She is compliant with treatment all of the time. There is no change in her home blood glucose trend. Her breakfast blood glucose is taken between 8-9 am. Her breakfast blood glucose range is generally 90-110 mg/dl. An ACE inhibitor/angiotensin II receptor blocker is being taken.  Hypertension This is a chronic problem. The problem is controlled. Pertinent negatives include no chest pain, headaches, palpitations or shortness of breath. Past treatments include ACE inhibitors, diuretics and calcium channel blockers. The current treatment provides significant improvement.  Hyperlipidemia This is a chronic problem. The problem is controlled. Associated symptoms include myalgias (right side lower ribs). Pertinent negatives include no chest pain or shortness of breath. Current antihyperlipidemic treatment includes statins. The current treatment provides significant improvement of lipids. Risk factors for coronary artery disease include diabetes mellitus, dyslipidemia and hypertension.  Rib/chest wall pain - started a week ago with no known injury.  It does not hurt to cough or deep breath.  It is mostly painful when she lays down or twists.  She has been taking Aleve but not  using heat or ice.  Lab Results  Component Value Date   HGBA1C 6.0 (H) 10/09/2018   Lab Results  Component Value Date   CREATININE 0.88 02/08/2019   BUN 9 02/08/2019   NA 138 02/08/2019   K 3.7 02/08/2019   CL 104 02/08/2019   CO2 25 02/08/2019   Lab Results  Component Value Date   CHOL 177 10/09/2018   HDL 37 (L) 10/09/2018   LDLCALC 70 10/09/2018   TRIG 349 (H) 10/09/2018   CHOLHDL 4.8 10/09/2018    Review of Systems  Constitutional: Negative for appetite change, fatigue, fever and unexpected weight change.  HENT: Negative for tinnitus and trouble swallowing.   Eyes: Negative for visual disturbance.  Respiratory: Negative for cough, chest tightness and shortness of breath.   Cardiovascular: Negative for chest pain, palpitations and leg swelling.  Gastrointestinal: Negative for abdominal pain, constipation and diarrhea.  Endocrine: Negative for polydipsia and polyuria.  Genitourinary: Negative for dysuria and hematuria.  Musculoskeletal: Positive for myalgias (right side lower ribs). Negative for arthralgias.  Skin: Negative for rash.  Allergic/Immunologic: Negative for environmental allergies.  Neurological: Negative for tremors, numbness and headaches.  Psychiatric/Behavioral: Negative for dysphoric mood and sleep disturbance.    Patient Active Problem List   Diagnosis Date Noted  . 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 Vertigo 10/09/2018  . Acute midline low back pain without sciatica 10/09/2018  . Hyperlipidemia associated with type 2 diabetes mellitus ([place]) 04/11/2018  . Gastroparesis 04/11/2018  . Long term (current) use of aromatase inhibitors 12/25/2017  . Arthritis   . Elevated LFTs   . Positive PPD, treated   . Umbilical hernia without  obstruction and without gangrene 07/13/2016  . Rectal polyp   . Irritable bowel syndrome with both constipation and diarrhea 07/20/2015  . Type II diabetes  mellitus with complication ([place]) [id number]  . Pre-ulcerative calluses 06/22/2015  . Neuropathy 06/22/2015  . Hot flash, menopausal 04/09/2015  . Essential (primary) hypertension 03/25/2015  . Osteoporosis 03/12/2015  . Cancer of left female breast  ([place]) 05/30/2014  . 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 BREAST BIOPSY Left 07/31/2014   positive. Lumpectomy 08/27/2014 with chemo and rad  . BREAST BIOPSY Left 03/08/2016   INTRADUCTAL PAPILLOMA WITH SCLEROSIS AND CALCIFICATIONS  . BREAST BIOPSY Left 03/08/2016   FAT NECROSIS WITH FIBROSIS  . 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 BREAST LUMPECTOMY WITH NEEDLE LOCALIZATION Left 04/05/2016   Procedure: BREAST LUMPECTOMY WITH NEEDLE LOCALIZATION;  Surgeon: Hubbard Robinson, MD;  Location: [facility];  Service: General;  Laterality: Left;  . BREAST LUMPECTOMY WITH NEEDLE LOCALIZATION AND AXILLARY SENTINEL LYMPH NODE BX Left 08/27/14  . CHOLECYSTECTOMY  09/07/2013  . COLONOSCOPY WITH PROPOFOL N/A 08/28/2015   Procedure: COLONOSCOPY WITH PROPOFOL;  Surgeon: Lucilla Lame, MD;  Location: [facility];  Service: Endoscopy;  Laterality: N/A;  Diabetic oral  . ESOPHAGOGASTRODUODENOSCOPY  2014   gastritis  . POLYPECTOMY  08/28/2015   Procedure: POLYPECTOMY INTESTINAL;  Surgeon: Lucilla Lame, MD;  Location: [facility];  Service: Endoscopy;;  Rectal polyp  . TUBAL LIGATION Bilateral   . UMBILICAL HERNIA REPAIR N/A 08/09/2016   Procedure: HERNIA REPAIR UMBILICAL ADULT;  Surgeon: Olean Ree, MD;  Location: [facility];  Service: General;  Laterality: N/A;  . VENTRAL HERNIA REPAIR N/A 04/10/2015   Procedure: HERNIA REPAIR VENTRAL ADULT;  Surgeon: Leonie Green, MD;  Location: [facility];  Service: General;  Laterality: N/A;    Social History   Tobacco Use  . Smoking status: Current Every Day Smoker    Packs/day: 0.50    Years:  38.00    Pack years: 19.00    Types: Cigarettes  . Smokeless tobacco: Never Used  . Tobacco comment: Smoke 8 cigerattes a day.   Substance Use Topics  . Alcohol use: Yes    Alcohol/week: 2.0 standard drinks    Types: 2 Cans of beer per week    Comment: Occasionally   . Drug use: No     Medication list has been reviewed and updated.  Current Meds  Medication Sig  . acetaminophen (TYLENOL) 325 MG tablet Take 650 mg by mouth daily as needed.  . Alcohol Swabs (B-D SINGLE USE SWABS REGULAR) PADS 1 each by Does not apply route 2 (two) times daily.  Marland Kitchen amLODipine (NORVASC) 10 MG tablet Take 1 tablet (10 mg total) by mouth daily.  Marland Kitchen anastrozole (ARIMIDEX) 1 MG tablet Take 1 tablet (1 mg total) by mouth daily.  Marland Kitchen aspirin EC 81 MG tablet Take 81 mg by mouth daily.  Marland Kitchen atorvastatin (LIPITOR) 10 MG tablet Take 1 tablet (10 mg total) by mouth daily.  . Blood Glucose Calibration (TRUE METRIX LEVEL 1) Low SOLN 1 each by In Vitro route daily as needed.  . Blood Glucose Monitoring Suppl (TRUE METRIX METER) DEVI 1 each by Does not apply route daily.  . colchicine 0.6 MG tablet Take 1 tablet (0.6 mg total) by mouth 2 (two) times daily. Up to 5 days as needed  . lisinopril (ZESTRIL) 40 MG tablet Take  1 tablet (40 mg total) by mouth daily.  . metFORMIN (GLUCOPHAGE-XR) 500 MG 24 hr tablet Take 1 tablet (500 mg total) by mouth daily with breakfast.  . naproxen sodium (ALEVE) 220 MG tablet Take 220 mg by mouth.  . triamterene-hydrochlorothiazide (MAXZIDE-25) 37.5-25 MG tablet Take 1 tablet by mouth daily.    PHQ 2/9 Scores 02/18/2019 10/09/2018 07/09/2018 11/08/2017  PHQ - 2 Score 0 3 1 0  PHQ- 9 Score - 10 - -    BP Readings from Last 3 Encounters:  02/18/19 (!) 150/78  02/08/19 (!) 149/85  12/20/18 (!) 199/88    Physical Exam Vitals signs and nursing note reviewed.  Constitutional:      General: She is not in acute distress.    Appearance: Normal appearance. She is well-developed.  HENT:      Head: Normocephalic and atraumatic.  Neck:     Musculoskeletal: Normal range of motion.  Cardiovascular:     Rate and Rhythm: Normal rate and regular rhythm.     Pulses: Normal pulses.  Pulmonary:     Effort: Pulmonary effort is normal. No respiratory distress.     Breath sounds: Normal breath sounds. No wheezing or rhonchi.  Musculoskeletal: Normal range of motion.     Lumbar back: She exhibits no tenderness, no bony tenderness and no spasm.     Right lower leg: No edema.     Left lower leg: No edema.     Comments: Tender along lower right ribs without deformity or significant tenderness  Feet:     Right foot:     Skin integrity: Callus and dry skin present.     Toenail Condition: Right toenails are abnormally thick.     Left foot:     Skin integrity: Callus and dry skin present.     Toenail Condition: Left toenails are abnormally thick.  Lymphadenopathy:     Cervical: No cervical adenopathy.  Skin:    General: Skin is warm and dry.     Findings: No rash.  Neurological:     Mental Status: She is alert and oriented to person, place, and time.  Psychiatric:        Behavior: Behavior normal.        Thought Content: Thought content normal.     Wt Readings from Last 3 Encounters:  02/18/19 203 lb (92.1 kg)  02/08/19 204 lb 11.2 oz (92.8 kg)  12/20/18 204 lb 9.4 oz (92.8 kg)    BP (!) 150/78   Pulse 84   Ht 5\' 7"  (1.702 m)   Wt 203 lb (92.1 kg)   SpO2 97%   BMI 31.79 kg/m   Assessment and Plan: 1. Type II diabetes mellitus with complication (HCC) Clinically stable by exam and report without s/s of hypoglycemia. DM complicated by foot deformity, hypertension and hyperlipidemia. Tolerating medications metformin 500 mg once a day well without side effects or other concerns. - Hemoglobin A1c  2. Essential (primary) hypertension BP is not controlled on current regimen of lisinopril 40 mg, amlodipine 10 mg and HCTZ 25 mg. Will stop lisinopril and begin Irbesartan 300 mg  once a day Monitor BP and call with concerns - irbesartan (AVAPRO) 300 MG tablet; Take 1 tablet (300 mg total) by mouth daily.  Dispense: 90 tablet; Refill: 1  3. Hyperlipidemia associated with type 2 diabetes mellitus ([place]) Tolerating statin medication without side effects at this time LDL is at goal of < 70 on current dose of atorvastatin 10 mg  Continue  same therapy without change at this time.  4. 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 Muscle strain of chest wall, initial encounter Continue Aleve, use heat or ice Avoid movements that aggravate symptoms  6. Need for immunization against influenza - Flu Vaccine QUAD 36+ mos IM   Partially dictated using Editor, commissioning. Any errors are unintentional.  Halina Maidens, MD [facility]  02/18/2019

## 2019-02-26 ENCOUNTER — Other Ambulatory Visit: Payer: Self-pay

## 2019-02-26 DIAGNOSIS — C50912 Malignant neoplasm of unspecified site of left female breast: Secondary | ICD-10-CM

## 2019-02-28 MED ORDER — ANASTROZOLE 1 MG PO TABS: 1.0000 mg | ORAL_TABLET | Freq: Every day | ORAL | 0 refills | Status: DC

## 2019-03-01 ENCOUNTER — Encounter: Payer: Self-pay | Admitting: Internal Medicine

## 2019-04-08 ENCOUNTER — Other Ambulatory Visit: Payer: Self-pay

## 2019-04-08 ENCOUNTER — Telehealth: Payer: Self-pay

## 2019-04-08 DIAGNOSIS — R140 Abdominal distension (gaseous): Secondary | ICD-10-CM

## 2019-04-08 DIAGNOSIS — R110 Nausea: Secondary | ICD-10-CM

## 2019-04-08 NOTE — Telephone Encounter (Signed)
-----   Message from Virgel Manifold, MD sent at 04/08/2019  8:36 AM EST ----- She was supposed to schedule an EGD in the past as per my clinic notes. See if she is ready to schedule yet. If not, give her a clinic follow up ----- Message ----- From: Lequita Asal, MD Sent: 02/15/2019   4:34 AM EST To: Virgel Manifold, MD

## 2019-04-08 NOTE — Telephone Encounter (Signed)
Called and scheduled patient for EGD on 04/15/2019. Patient COVID test on 04/11/19. Went over instructions with patient and mailed them

## 2019-04-11 ENCOUNTER — Telehealth: Payer: Self-pay

## 2019-04-11 NOTE — Telephone Encounter (Signed)
Called patient to confirm patient procedure appointment and to remind patient that they have to go for a COVID test on 04/12/2019.  Patient verbalized understanding   

## 2019-04-12 ENCOUNTER — Other Ambulatory Visit
Admission: RE | Admit: 2019-04-12 | Discharge: 2019-04-12 | Disposition: A | Discharge status: Home / Self Care | Payer: Medicare Other | Source: Ambulatory Visit | Attending: Gastroenterology | Admitting: Gastroenterology

## 2019-04-12 ENCOUNTER — Other Ambulatory Visit: Payer: Self-pay

## 2019-04-12 DIAGNOSIS — Z20828 Contact with and (suspected) exposure to other viral communicable diseases: Secondary | ICD-10-CM | POA: Diagnosis not present

## 2019-04-12 DIAGNOSIS — Z01812 Encounter for preprocedural laboratory examination: Secondary | ICD-10-CM | POA: Insufficient documentation

## 2019-04-13 LAB — SARS CORONAVIRUS 2 (TAT 6-24 HRS): SARS Coronavirus 2: NEGATIVE

## 2019-04-15 MED ORDER — SODIUM CHLORIDE 0.9 % IV SOLN
INTRAVENOUS | Status: DC
  Administered 2019-04-16: 1000 mL via INTRAVENOUS

## 2019-04-16 ENCOUNTER — Ambulatory Visit: Payer: Medicare Other | Admitting: Anesthesiology

## 2019-04-16 ENCOUNTER — Ambulatory Visit
Admission: RE | Admit: 2019-04-16 | Discharge: 2019-04-16 | Disposition: A | Discharge status: Home / Self Care | Payer: Medicare Other | Attending: Gastroenterology | Admitting: Gastroenterology

## 2019-04-16 ENCOUNTER — Encounter
Admission: RE | Disposition: A | Discharge status: Home / Self Care | Payer: Self-pay | Source: Home / Self Care | Attending: Gastroenterology

## 2019-04-16 ENCOUNTER — Encounter: Payer: Self-pay | Admitting: *Deleted

## 2019-04-16 DIAGNOSIS — R110 Nausea: Secondary | ICD-10-CM | POA: Insufficient documentation

## 2019-04-16 DIAGNOSIS — K3189 Other diseases of stomach and duodenum: Secondary | ICD-10-CM | POA: Diagnosis not present

## 2019-04-16 DIAGNOSIS — R140 Abdominal distension (gaseous): Secondary | ICD-10-CM

## 2019-04-16 DIAGNOSIS — Z7982 Long term (current) use of aspirin: Secondary | ICD-10-CM | POA: Diagnosis not present

## 2019-04-16 DIAGNOSIS — K219 Gastro-esophageal reflux disease without esophagitis: Secondary | ICD-10-CM | POA: Insufficient documentation

## 2019-04-16 DIAGNOSIS — E119 Type 2 diabetes mellitus without complications: Secondary | ICD-10-CM | POA: Diagnosis not present

## 2019-04-16 DIAGNOSIS — E785 Hyperlipidemia, unspecified: Secondary | ICD-10-CM | POA: Insufficient documentation

## 2019-04-16 DIAGNOSIS — K2950 Unspecified chronic gastritis without bleeding: Secondary | ICD-10-CM | POA: Diagnosis not present

## 2019-04-16 DIAGNOSIS — J449 Chronic obstructive pulmonary disease, unspecified: Secondary | ICD-10-CM | POA: Insufficient documentation

## 2019-04-16 DIAGNOSIS — I10 Essential (primary) hypertension: Secondary | ICD-10-CM | POA: Diagnosis not present

## 2019-04-16 DIAGNOSIS — M8949 Other hypertrophic osteoarthropathy, multiple sites: Secondary | ICD-10-CM | POA: Insufficient documentation

## 2019-04-16 DIAGNOSIS — F17210 Nicotine dependence, cigarettes, uncomplicated: Secondary | ICD-10-CM | POA: Insufficient documentation

## 2019-04-16 DIAGNOSIS — Z79899 Other long term (current) drug therapy: Secondary | ICD-10-CM | POA: Insufficient documentation

## 2019-04-16 DIAGNOSIS — Z7984 Long term (current) use of oral hypoglycemic drugs: Secondary | ICD-10-CM | POA: Diagnosis not present

## 2019-04-16 DIAGNOSIS — B9681 Helicobacter pylori [H. pylori] as the cause of diseases classified elsewhere: Secondary | ICD-10-CM | POA: Diagnosis not present

## 2019-04-16 HISTORY — PX: ESOPHAGOGASTRODUODENOSCOPY (EGD) WITH PROPOFOL: SHX5813

## 2019-04-16 HISTORY — DX: Gastro-esophageal reflux disease without esophagitis: K21.9

## 2019-04-16 LAB — GLUCOSE, CAPILLARY: Glucose-Capillary: 118 mg/dL — ABNORMAL HIGH (ref 70–99)

## 2019-04-16 SURGERY — ESOPHAGOGASTRODUODENOSCOPY (EGD) WITH PROPOFOL
Anesthesia: General

## 2019-04-16 MED ORDER — FENTANYL CITRATE (PF) 100 MCG/2ML IJ SOLN
INTRAMUSCULAR | Status: AC
  Filled 2019-04-16: qty 2

## 2019-04-16 MED ORDER — MIDAZOLAM HCL 2 MG/2ML IJ SOLN
INTRAMUSCULAR | Status: DC | PRN
  Administered 2019-04-16: 2 mg via INTRAVENOUS

## 2019-04-16 MED ORDER — PROPOFOL 500 MG/50ML IV EMUL
INTRAVENOUS | Status: DC | PRN
  Administered 2019-04-16: 120 ug/kg/min via INTRAVENOUS

## 2019-04-16 MED ORDER — LIDOCAINE HCL (CARDIAC) PF 100 MG/5ML IV SOSY
PREFILLED_SYRINGE | INTRAVENOUS | Status: DC | PRN
  Administered 2019-04-16: 50 mg via INTRAVENOUS

## 2019-04-16 MED ORDER — LIDOCAINE HCL (PF) 2 % IJ SOLN
INTRAMUSCULAR | Status: AC
  Filled 2019-04-16: qty 10

## 2019-04-16 MED ORDER — MIDAZOLAM HCL 2 MG/2ML IJ SOLN
INTRAMUSCULAR | Status: AC
  Filled 2019-04-16: qty 2

## 2019-04-16 MED ORDER — FENTANYL CITRATE (PF) 100 MCG/2ML IJ SOLN
INTRAMUSCULAR | Status: DC | PRN
  Administered 2019-04-16: 50 ug via INTRAVENOUS

## 2019-04-16 MED ORDER — PROPOFOL 500 MG/50ML IV EMUL
INTRAVENOUS | Status: AC
  Filled 2019-04-16: qty 50

## 2019-04-16 NOTE — Anesthesia Procedure Notes (Signed)
Performed by: Cook-Martin, Macel Yearsley Pre-anesthesia Checklist: Patient identified, Emergency Drugs available, Suction available, Patient being monitored and Timeout performed Patient Re-evaluated:Patient Re-evaluated prior to induction Oxygen Delivery Method: Nasal cannula Preoxygenation: Pre-oxygenation with 100% oxygen Induction Type: IV induction Airway Equipment and Method: Bite block Placement Confirmation: positive ETCO2 and CO2 detector       

## 2019-04-16 NOTE — Transfer of Care (Signed)
Immediate Anesthesia Transfer of Care Note  Patient: Donna Riley  Procedure(s) Performed: ESOPHAGOGASTRODUODENOSCOPY (EGD) WITH PROPOFOL (N/A )  Patient Location: PACU  Anesthesia Type:General  Level of Consciousness: awake and sedated  Airway & Oxygen Therapy: Patient Spontanous Breathing and Patient connected to nasal cannula oxygen  Post-op Assessment: Report given to RN and Post -op Vital signs reviewed and stable  Post vital signs: Reviewed and stable  Last Vitals:  Vitals Value Taken Time  BP    Temp    Pulse    Resp    SpO2      Last Pain:  Vitals:   04/16/19 0903  TempSrc: Skin  PainSc: 0-No pain         Complications: No apparent anesthesia complications

## 2019-04-16 NOTE — Anesthesia Post-op Follow-up Note (Signed)
Anesthesia QCDR form completed.        

## 2019-04-16 NOTE — Op Note (Signed)
[facility] Gastroenterology Patient Name: Donna Riley Procedure Date: 04/16/2019 10:27 AM MRN: [id number] Account #: [id number] Date of Birth: 28-Aug-1960 Admit Type: Outpatient Age: 58 Room: [facility] ENDO ROOM 2 Gender: Female Note Status: Finalized Procedure:             Upper GI endoscopy Indications:           Abdominal distention, Nausea Providers:             Carlia Bomkamp B. Bonna Gains MD, MD Referring MD:          Halina Maidens, MD (Referring MD) Medicines:             Monitored Anesthesia Care Complications:         No immediate complications. Procedure:             Pre-Anesthesia Assessment:                        - Prior to the procedure, a History and Physical was                         performed, and patient medications, allergies and                         sensitivities were reviewed. The patient's tolerance                         of previous anesthesia was reviewed.                        - The risks and benefits of the procedure and the                         sedation options and risks were discussed with the                         patient. All questions were answered and informed                         consent was obtained.                        - Patient identification and proposed procedure were                         verified prior to the procedure by the physician, the                         nurse, the anesthesiologist, the anesthetist and the                         technician. The procedure was verified in the                         procedure room.                        - ASA Grade Assessment: II - A patient with mild                         systemic disease.  After obtaining informed consent, the endoscope was                         passed under direct vision. Throughout the procedure,                         the patient's blood pressure, pulse, and oxygen                         saturations were monitored  continuously. The Endoscope                         was introduced through the mouth, and advanced to the                         second part of duodenum. The upper GI endoscopy was                         accomplished with ease. The patient tolerated the                         procedure well. Findings:      The examined esophagus was normal.      Patchy mildly erythematous mucosa without bleeding was found in the       gastric antrum. Biopsies were taken with a cold forceps for histology.       Biopsies were obtained in the gastric body, at the incisura and in the       gastric antrum with cold forceps for histology.      The duodenal bulb, second portion of the duodenum and examined duodenum       were normal. Impression:            - Normal esophagus.                        - Erythematous mucosa in the antrum. Biopsied.                        - Normal duodenal bulb, second portion of the duodenum                         and examined duodenum.                        - Biopsies were obtained in the gastric body, at the                         incisura and in the gastric antrum. Recommendation:        - Await pathology results.                        - Gastric emptying study if biopsies are unrevealing                        - Discharge patient to home (with escort).                        - Advance diet as tolerated.                        -  Continue present medications.                        - Patient has a contact number available for                         emergencies. The signs and symptoms of potential                         delayed complications were discussed with the patient.                         Return to normal activities tomorrow. Written                         discharge instructions were provided to the patient.                        - Discharge patient to home (with escort).                        - The findings and recommendations were discussed with                          the patient.                        - The findings and recommendations were discussed with                         the patient's family. Procedure Code(s):     --- Professional ---                        [phone], Esophagogastroduodenoscopy, flexible,                         transoral; with biopsy, single or multiple Diagnosis Code(s):     --- Professional ---                        K31.89, Other diseases of stomach and duodenum                        R14.0, Abdominal distension (gaseous)                        R11.0, Nausea CPT copyright 2019 [employer]. All rights reserved. The codes documented in this report are preliminary and upon coder review may  be revised to meet current compliance requirements.  Vonda Antigua, MD Margretta Sidle B. Bonna Gains MD, MD 04/16/2019 10:57:06 AM This report has been signed electronically. Number of Addenda: 0 Note Initiated On: 04/16/2019 10:27 AM Estimated Blood Loss:  Estimated blood loss: none.      [facility]

## 2019-04-16 NOTE — H&P (Signed)
Vonda Antigua, MD [address], [place], [place], [place], [address] [id number], [place], [place], [place], [address] Phone: [phone]  Fax: [phone]  Primary Care Physician:  Glean Hess, MD   Pre-Procedure History & Physical: HPI:  Donna Riley is a 58 y.o. female is here for an EGD.   Past Medical History:  Diagnosis Date  . Arthritis    shoulders and neck  . Breast cancer ([facility]) 2016    Left breast with chemo + rad tx's with lumpectomy.  . Breast wound, left, sequela 06/23/2016  . Diabetes mellitus without complication ([place]) Q000111Q  . Elevated LFTs   . Fatigue 08/01/2016  . GERD (gastroesophageal reflux disease)   . Gout   . Hydradenitis 01/23/2015  . Hyperlipidemia   . Hypertension   . Mastitis 10/15/2016  . Personal history of chemotherapy   . Personal history of chemotherapy   . Personal history of radiation therapy 2016   LEFT lumpectomy  . Personal history of radiation therapy   . Positive PPD, treated   . Shortness of breath dyspnea    with exertion    Past Surgical History:  Procedure Laterality Date  . BREAST BIOPSY Left 07/31/2014   positive. Lumpectomy 08/27/2014 with chemo and rad  . BREAST BIOPSY Left 03/08/2016   INTRADUCTAL PAPILLOMA WITH SCLEROSIS AND CALCIFICATIONS  . BREAST BIOPSY Left 03/08/2016   FAT NECROSIS WITH FIBROSIS  . 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 BREAST LUMPECTOMY WITH NEEDLE LOCALIZATION Left 04/05/2016   Procedure: BREAST LUMPECTOMY WITH NEEDLE LOCALIZATION;  Surgeon: Hubbard Robinson, MD;  Location: [facility];  Service: General;  Laterality: Left;  . BREAST LUMPECTOMY WITH NEEDLE LOCALIZATION AND AXILLARY SENTINEL LYMPH NODE BX Left 08/27/14  . CHOLECYSTECTOMY  09/07/2013  . COLONOSCOPY WITH PROPOFOL N/A 08/28/2015   Procedure: COLONOSCOPY WITH PROPOFOL;  Surgeon: Lucilla Lame, MD;  Location: [facility];  Service: Endoscopy;  Laterality: N/A;  Diabetic oral  .  ESOPHAGOGASTRODUODENOSCOPY  2014   gastritis  . POLYPECTOMY  08/28/2015   Procedure: POLYPECTOMY INTESTINAL;  Surgeon: Lucilla Lame, MD;  Location: [facility];  Service: Endoscopy;;  Rectal polyp  . TUBAL LIGATION Bilateral   . UMBILICAL HERNIA REPAIR N/A 08/09/2016   Procedure: HERNIA REPAIR UMBILICAL ADULT;  Surgeon: Olean Ree, MD;  Location: [facility];  Service: General;  Laterality: N/A;  . VENTRAL HERNIA REPAIR N/A 04/10/2015   Procedure: HERNIA REPAIR VENTRAL ADULT;  Surgeon: Leonie Green, MD;  Location: [facility];  Service: General;  Laterality: N/A;    Prior to Admission medications   Medication Sig Start Date End Date Taking? Authorizing Provider  acetaminophen (TYLENOL) 325 MG tablet Take 650 mg by mouth daily as needed. 09/08/13   [provider]  Alcohol Swabs (B-D SINGLE USE SWABS REGULAR) PADS 1 each by Does not apply route 2 (two) times daily. 11/22/17   Glean Hess, MD  amLODipine (NORVASC) 10 MG tablet Take 1 tablet (10 mg total) by mouth daily. 10/09/18   Glean Hess, MD  anastrozole (ARIMIDEX) 1 MG tablet Take 1 tablet (1 mg total) by mouth daily. 02/28/19   Lequita Asal, MD  aspirin EC 81 MG tablet Take 81 mg by mouth daily.    [provider]  atorvastatin (LIPITOR) 10 MG tablet Take 1 tablet (10 mg total) by mouth daily. 04/12/18   Glean Hess, MD  Blood Glucose Calibration (TRUE METRIX LEVEL 1) Low SOLN 1 each  by In Vitro route daily as needed. 11/22/17   Glean Hess, MD  Blood Glucose Monitoring Suppl (TRUE METRIX METER) DEVI 1 each by Does not apply route daily. 12/15/17   Glean Hess, MD  colchicine 0.6 MG tablet Take 1 tablet (0.6 mg total) by mouth 2 (two) times daily. Up to 5 days as needed 12/15/17   Glean Hess, MD  irbesartan (AVAPRO) 300 MG tablet Take 1 tablet (300 mg total) by mouth daily. 02/18/19   Glean Hess, MD  lisinopril (ZESTRIL) 40 MG tablet Take 1 tablet (40 mg total) by mouth  daily. 10/09/18   Glean Hess, MD  metFORMIN (GLUCOPHAGE-XR) 500 MG 24 hr tablet Take 1 tablet (500 mg total) by mouth daily with breakfast. 10/09/18   Glean Hess, MD  naproxen sodium (ALEVE) 220 MG tablet Take 220 mg by mouth.    [provider]    Allergies as of 04/08/2019  . (No Known Allergies)    Family History  Problem Relation Age of Onset  . Cancer Mother        Pancreatic  . Cancer Father        Throat  . Healthy Sister   . HIV Brother   . Cancer Maternal Grandmother        Breast  . Breast cancer Maternal Grandmother 75    Social History   Socioeconomic History  . Marital status: Widowed    Spouse name: Not on file  . Number of children: 4  . Years of education: Not on file  . Highest education level: 10th grade  Occupational History  . Occupation: Disabled  Social Needs  . Financial resource strain: Not hard at all  . Food insecurity    Worry: Never true    Inability: Never true  . Transportation needs    Medical: No    Non-medical: No  Tobacco Use  . Smoking status: Current Every Day Smoker    Packs/day: 0.50    Years: 38.00    Pack years: 19.00    Types: Cigarettes  . Smokeless tobacco: Never Used  . Tobacco comment: Smoke 8 cigerattes a day.   Substance and Sexual Activity  . Alcohol use: Yes    Alcohol/week: 2.0 standard drinks    Types: 2 Cans of beer per week    Comment: Occasionally   . Drug use: No  . Sexual activity: Not Currently  Lifestyle  . Physical activity    Days per week: 2 days    Minutes per session: 30 min  . Stress: Rather much  Relationships  . Social connections    Talks on phone: More than three times a week    Gets together: More than three times a week    Attends religious service: More than 4 times per year    Active member of club or organization: No    Attends meetings of clubs or organizations: Never    Relationship status: Married  . Intimate partner violence    Fear of current or ex  partner: No    Emotionally abused: No    Physically abused: No    Forced sexual activity: No  Other Topics Concern  . Not on file  Social History Narrative  . Not on file    Review of Systems: See HPI, otherwise negative ROS  Physical Exam: BP (!) 157/83   Pulse 79   Temp (!) 97.5 F (36.4 C) (Skin)   Resp 18  Ht 5\' 8"  (1.727 m)   Wt 92.5 kg   SpO2 99%   BMI 31.02 kg/m  General:   Alert,  pleasant and cooperative in NAD Head:  Normocephalic and atraumatic. Neck:  Supple; no masses or thyromegaly. Lungs:  Clear throughout to auscultation, normal respiratory effort.    Heart:  +S1, +S2, Regular rate and rhythm, No edema. Abdomen:  Soft, nontender and nondistended. Normal bowel sounds, without guarding, and without rebound.   Neurologic:  Alert and  oriented x4;  grossly normal neurologically.  Impression/Plan: Donna Riley is here for an EGD for abdominal bloating and distension  Risks, benefits, limitations, and alternatives regarding the procedure have been reviewed with the patient.  Questions have been answered.  All parties agreeable.   Virgel Manifold, MD  04/16/2019, 9:08 AM

## 2019-04-16 NOTE — Anesthesia Preprocedure Evaluation (Signed)
Anesthesia Evaluation  Patient identified by MRN, date of birth, ID band Patient awake    Reviewed: Allergy & Precautions, H&P , NPO status , Patient's Chart, lab work & pertinent test results  History of Anesthesia Complications Negative for: history of anesthetic complications  Airway Mallampati: III  TM Distance: >3 FB Neck ROM: limited    Dental  (+) Chipped, Poor Dentition   Pulmonary shortness of breath and with exertion, COPD, Current Smoker and Patient abstained from smoking.,           Cardiovascular Exercise Tolerance: Good hypertension, (-) angina(-) Past MI      Neuro/Psych negative neurological ROS  negative psych ROS   GI/Hepatic Neg liver ROS, GERD  Medicated and Controlled,  Endo/Other  diabetes, Type 2  Renal/GU negative Renal ROS  negative genitourinary   Musculoskeletal  (+) Arthritis ,   Abdominal   Peds  Hematology negative hematology ROS (+)   Anesthesia Other Findings Past Medical History: No date: Arthritis     Comment:  shoulders and neck 2016: Breast cancer ([facility])     Comment:   Left breast with chemo + rad tx's with lumpectomy. 06/23/2016: Breast wound, left, sequela 06/2014: Diabetes mellitus without complication ([facility]) No date: Elevated LFTs 08/01/2016: Fatigue No date: GERD (gastroesophageal reflux disease) No date: Gout 01/23/2015: Hydradenitis No date: Hyperlipidemia No date: Hypertension 10/15/2016: Mastitis No date: Personal history of chemotherapy No date: Personal history of chemotherapy 2016: Personal history of radiation therapy     Comment:  LEFT lumpectomy No date: Personal history of radiation therapy No date: Positive PPD, treated No date: Shortness of breath dyspnea     Comment:  with exertion  Past Surgical History: 07/31/2014: BREAST BIOPSY; Left     Comment:  positive. Lumpectomy 08/27/2014 with chemo and rad 03/08/2016: BREAST BIOPSY; Left     Comment:   INTRADUCTAL PAPILLOMA WITH SCLEROSIS AND CALCIFICATIONS 03/08/2016: BREAST BIOPSY; Left     Comment:  FAT NECROSIS WITH FIBROSIS 2016: BREAST LUMPECTOMY; Left     Comment:  INVASIVE MAMMARY CARCINOMA,DCIS 04/05/2016: BREAST LUMPECTOMY WITH NEEDLE LOCALIZATION; Left     Comment:  Procedure: BREAST LUMPECTOMY WITH NEEDLE LOCALIZATION;                Surgeon: Hubbard Robinson, MD;  Location: [facility];                Service: General;  Laterality: Left; 08/27/14: BREAST LUMPECTOMY WITH NEEDLE LOCALIZATION AND AXILLARY  SENTINEL LYMPH NODE BX; Left 09/07/2013: CHOLECYSTECTOMY 08/28/2015: COLONOSCOPY WITH PROPOFOL; N/A     Comment:  Procedure: COLONOSCOPY WITH PROPOFOL;  Surgeon: Lucilla Lame, MD;  Location: [facility];  Service:               Endoscopy;  Laterality: N/A;  Diabetic oral 2014: ESOPHAGOGASTRODUODENOSCOPY     Comment:  gastritis 08/28/2015: POLYPECTOMY     Comment:  Procedure: POLYPECTOMY INTESTINAL;  Surgeon: Lucilla Lame, MD;  Location: [facility];  Service:               Endoscopy;;  Rectal polyp No date: TUBAL LIGATION; Bilateral [id number]: UMBILICAL HERNIA REPAIR; N/A     Comment:  Procedure: HERNIA REPAIR UMBILICAL ADULT;  Surgeon: Olean Ree, MD;  Location: [facility];  Service: General;                Laterality: N/A; 04/10/2015: VENTRAL HERNIA REPAIR; N/A     Comment:  Procedure: HERNIA REPAIR VENTRAL ADULT;  Surgeon: Leonie Green, MD;  Location: [facility];  Service: General;              Laterality: N/A;  BMI    Body Mass Index: 31.02 kg/m      Reproductive/Obstetrics negative OB ROS                             Anesthesia Physical Anesthesia Plan  ASA: III  Anesthesia Plan: General   Post-op Pain Management:    Induction: Intravenous  PONV Risk Score and Plan: Propofol infusion and TIVA  Airway Management Planned: Natural Airway and Nasal  Cannula  Additional Equipment:   Intra-op Plan:   Post-operative Plan:   Informed Consent: I have reviewed the patients History and Physical, chart, labs and discussed the procedure including the risks, benefits and alternatives for the proposed anesthesia with the patient or authorized representative who has indicated his/her understanding and acceptance.     Dental Advisory Given  Plan Discussed with: Anesthesiologist, CRNA and Surgeon  Anesthesia Plan Comments: (Patient consented for risks of anesthesia including but not limited to:  - adverse reactions to medications - risk of intubation if required - damage to teeth, lips or other oral mucosa - sore throat or hoarseness - Damage to heart, brain, lungs or loss of life  Patient voiced understanding.)        Anesthesia Quick Evaluation

## 2019-04-16 NOTE — Anesthesia Postprocedure Evaluation (Signed)
Anesthesia Post Note  Patient: NINEL HOSKIE  Procedure(s) Performed: ESOPHAGOGASTRODUODENOSCOPY (EGD) WITH PROPOFOL (N/A )  Patient location during evaluation: Endoscopy Anesthesia Type: General Level of consciousness: awake and alert Pain management: pain level controlled Vital Signs Assessment: post-procedure vital signs reviewed and stable Respiratory status: spontaneous breathing, nonlabored ventilation, respiratory function stable and patient connected to nasal cannula oxygen Cardiovascular status: blood pressure returned to baseline and stable Postop Assessment: no apparent nausea or vomiting Anesthetic complications: no     Last Vitals:  Vitals:   04/16/19 1125 04/16/19 1135  BP: (!) 184/91 (!) 198/100  Pulse: 62 (!) 59  Resp: 16 17  Temp:    SpO2: 96% 97%    Last Pain:  Vitals:   04/16/19 1135  TempSrc:   PainSc: 0-No pain                 Precious Haws Elisama Thissen

## 2019-04-17 ENCOUNTER — Encounter: Payer: Self-pay | Admitting: Gastroenterology

## 2019-04-17 LAB — SURGICAL PATHOLOGY

## 2019-04-22 ENCOUNTER — Telehealth: Payer: Self-pay

## 2019-04-22 NOTE — Telephone Encounter (Signed)
Patient is calling wanting to know her path report.  Patient states she is still have some epigastric pain that is a aching pain

## 2019-04-24 ENCOUNTER — Telehealth: Payer: Self-pay

## 2019-04-24 ENCOUNTER — Other Ambulatory Visit: Payer: Self-pay | Admitting: Gastroenterology

## 2019-04-24 DIAGNOSIS — Q400 Congenital hypertrophic pyloric stenosis: Secondary | ICD-10-CM

## 2019-04-24 MED ORDER — DOXYCYCLINE HYCLATE 100 MG PO CAPS: 100.0000 mg | ORAL_CAPSULE | Freq: Two times a day (BID) | ORAL | 0 refills | Status: AC

## 2019-04-24 MED ORDER — DOXYCYCLINE HYCLATE 100 MG PO CAPS: 100.0000 mg | ORAL_CAPSULE | Freq: Two times a day (BID) | ORAL | 0 refills | Status: DC

## 2019-04-24 MED ORDER — METRONIDAZOLE 250 MG PO TABS: 250.0000 mg | ORAL_TABLET | Freq: Four times a day (QID) | ORAL | 0 refills | Status: AC

## 2019-04-24 MED ORDER — BISMUTH SUBSALICYLATE 262 MG PO CHEW: 524.0000 mg | CHEWABLE_TABLET | Freq: Three times a day (TID) | ORAL | 0 refills | Status: DC

## 2019-04-24 MED ORDER — PANTOPRAZOLE SODIUM 20 MG PO TBEC: 20.0000 mg | DELAYED_RELEASE_TABLET | Freq: Two times a day (BID) | ORAL | 0 refills | Status: DC

## 2019-04-24 MED ORDER — BISMUTH SUBSALICYLATE 262 MG PO CHEW: 524.0000 mg | CHEWABLE_TABLET | Freq: Three times a day (TID) | ORAL | 0 refills | Status: AC

## 2019-04-24 NOTE — Telephone Encounter (Signed)
-----   Message from Virgel Manifold, MD sent at 04/24/2019 10:15 AM EST ----- Caryl Pina please let the patient know, her biopsies showed infection with H. pylori bacteria.  I have sent for medications to her pharmacy that she will take for 2 weeks to help clear this bacteria.  She should also get an H. pylori breath test for eradication testing, 4 weeks after she completes the treatment.  Please order.  She will need a repeat EGD in about 6 months for repeat biopsy to confirm that the bacteria has cleared, and also as there were changes called intestinal metaplasia seen on biopsies, which can go away after clearance of the bacteria.  Please set up clinic recall for 6 months and EGD recall for 6 months

## 2019-04-24 NOTE — Telephone Encounter (Signed)
Patient verbalized understanding of lab results. [facility] Patient will repeat breath test in 6 weeks. Put patient on the recall list for EGD

## 2019-06-03 ENCOUNTER — Other Ambulatory Visit: Payer: Self-pay

## 2019-06-03 ENCOUNTER — Encounter: Payer: Self-pay | Admitting: Internal Medicine

## 2019-06-03 ENCOUNTER — Ambulatory Visit (INDEPENDENT_AMBULATORY_CARE_PROVIDER_SITE_OTHER): Payer: Medicare Other | Admitting: Internal Medicine

## 2019-06-03 ENCOUNTER — Other Ambulatory Visit
Admission: RE | Admit: 2019-06-03 | Discharge: 2019-06-03 | Disposition: A | Discharge status: Home / Self Care | Payer: Medicare Other | Attending: Internal Medicine | Admitting: Internal Medicine

## 2019-06-03 VITALS — BP 140/76 | HR 79 | Ht 68.0 in | Wt 203.0 lb

## 2019-06-03 DIAGNOSIS — E118 Type 2 diabetes mellitus with unspecified complications: Secondary | ICD-10-CM | POA: Insufficient documentation

## 2019-06-03 DIAGNOSIS — K2970 Gastritis, unspecified, without bleeding: Secondary | ICD-10-CM

## 2019-06-03 DIAGNOSIS — C50912 Malignant neoplasm of unspecified site of left female breast: Secondary | ICD-10-CM | POA: Diagnosis not present

## 2019-06-03 DIAGNOSIS — I10 Essential (primary) hypertension: Secondary | ICD-10-CM | POA: Insufficient documentation

## 2019-06-03 DIAGNOSIS — Z170 Estrogen receptor positive status [ER+]: Secondary | ICD-10-CM

## 2019-06-03 DIAGNOSIS — B9681 Helicobacter pylori [H. pylori] as the cause of diseases classified elsewhere: Secondary | ICD-10-CM | POA: Insufficient documentation

## 2019-06-03 DIAGNOSIS — C773 Secondary and unspecified malignant neoplasm of axilla and upper limb lymph nodes: Secondary | ICD-10-CM

## 2019-06-03 DIAGNOSIS — M810 Age-related osteoporosis without current pathological fracture: Secondary | ICD-10-CM

## 2019-06-03 HISTORY — DX: Helicobacter pylori (H. pylori) as the cause of diseases classified elsewhere: B96.81

## 2019-06-03 LAB — COMPREHENSIVE METABOLIC PANEL
ALT: 30 U/L (ref 0–44)
AST: 20 U/L (ref 15–41)
Albumin: 4.1 g/dL (ref 3.5–5.0)
Alkaline Phosphatase: 93 U/L (ref 38–126)
Anion gap: 9 (ref 5–15)
BUN: 11 mg/dL (ref 6–20)
CO2: 25 mmol/L (ref 22–32)
Calcium: 9.1 mg/dL (ref 8.9–10.3)
Chloride: 105 mmol/L (ref 98–111)
Creatinine, Ser: 0.85 mg/dL (ref 0.44–1.00)
GFR calc Af Amer: >60 mL/min (ref >60)
GFR calc non Af Amer: >60 mL/min (ref >60)
Glucose, Bld: 106 mg/dL — ABNORMAL HIGH (ref 70–99)
Potassium: 3.9 mmol/L (ref 3.5–5.1)
Sodium: 139 mmol/L (ref 135–145)
Total Bilirubin: 0.6 mg/dL (ref 0.3–1.2)
Total Protein: 7.6 g/dL (ref 6.5–8.1)

## 2019-06-03 NOTE — Progress Notes (Signed)
Date:  06/03/2019   Name:  Donna Riley   DOB:  [birth year]   MRN:  [id number]   Chief Complaint: Diabetes (Follow up. ) and Hypertension  Diabetes She presents for her follow-up diabetic visit. She has type 2 diabetes mellitus. Her disease course has been stable. Pertinent negatives for hypoglycemia include no dizziness or headaches. Pertinent negatives for diabetes include no chest pain and no fatigue. Symptoms are stable. Current diabetic treatment includes oral agent (monotherapy). She is compliant with treatment all of the time. Her weight is stable. There is no change in her home blood glucose trend. Her breakfast blood glucose is taken between 6-7 am. Her breakfast blood glucose range is generally 90-110 mg/dl. An ACE inhibitor/angiotensin II receptor blocker is being taken.  Hypertension This is a chronic problem. The problem is controlled. Pertinent negatives include no chest pain, headaches, palpitations or shortness of breath. Past treatments include calcium channel blockers and angiotensin blockers. The current treatment provides significant improvement. There are no compliance problems.   Abd pain - she was seen by GI - had endoscopy showing gastritis and was positive for H Pylori.  This was treated and she is feeling much better.  The plan is to repeat the EGD in 6 months from the first.  Lab Results  Component Value Date   CREATININE 0.88 02/08/2019   BUN 9 02/08/2019   NA 138 02/08/2019   K 3.7 02/08/2019   CL 104 02/08/2019   CO2 25 02/08/2019   Lab Results  Component Value Date   CHOL 177 10/09/2018   HDL 37 (L) 10/09/2018   LDLCALC 70 10/09/2018   TRIG 349 (H) 10/09/2018   CHOLHDL 4.8 10/09/2018   Lab Results  Component Value Date   TSH 1.765 04/11/2018   Lab Results  Component Value Date   HGBA1C 6.2 (H) 02/18/2019     Review of Systems  Constitutional: Negative for chills, fatigue and fever.  HENT: Positive for dental problem.   Eyes: Negative for  visual disturbance.  Respiratory: Negative for cough, chest tightness, shortness of breath and wheezing.   Cardiovascular: Negative for chest pain and palpitations.  Genitourinary: Negative for dysuria, frequency and urgency.  Neurological: Negative for dizziness and headaches.  Hematological: Negative for adenopathy.    Patient Active Problem List   Diagnosis Date Noted  . Abdominal bloating   . Nausea   . Stomach irritation   . Secondary and unspecified malignant neoplasm of axilla and upper limb lymph nodes ([facility]) 02/18/2019  . Vertigo 10/09/2018  . Acute midline low back pain without sciatica 10/09/2018  . Hyperlipidemia associated with type 2 diabetes mellitus ([facility]) 04/11/2018  . Gastroparesis 04/11/2018  . Long term (current) use of aromatase inhibitors 12/25/2017  . Arthritis   . Elevated LFTs   . Positive PPD, treated   . Umbilical hernia without obstruction and without gangrene 07/13/2016  . Rectal polyp   . Irritable bowel syndrome with both constipation and diarrhea 07/20/2015  . Type II diabetes mellitus with complication ([facility]) Q000111Q  . Pre-ulcerative calluses 06/22/2015  . Neuropathy 06/22/2015  . Hot flash, menopausal 04/09/2015  . Essential (primary) hypertension 03/25/2015  . Osteoporosis 03/12/2015  . Cancer of left female breast  ([facility]) 05/30/2014  . 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 BREAST BIOPSY Left 07/31/2014   positive. Lumpectomy 08/27/2014 with chemo and rad  . BREAST BIOPSY Left 03/08/2016  INTRADUCTAL PAPILLOMA WITH SCLEROSIS AND CALCIFICATIONS  . BREAST BIOPSY Left 03/08/2016   FAT NECROSIS WITH FIBROSIS  . 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 BREAST LUMPECTOMY WITH NEEDLE LOCALIZATION Left 04/05/2016   Procedure: BREAST LUMPECTOMY WITH NEEDLE LOCALIZATION;  Surgeon: Hubbard Robinson, MD;  Location: [facility];  Service: General;   Laterality: Left;  . BREAST LUMPECTOMY WITH NEEDLE LOCALIZATION AND AXILLARY SENTINEL LYMPH NODE BX Left 08/27/14  . CHOLECYSTECTOMY  09/07/2013  . COLONOSCOPY WITH PROPOFOL N/A 08/28/2015   Procedure: COLONOSCOPY WITH PROPOFOL;  Surgeon: Lucilla Lame, MD;  Location: [facility];  Service: Endoscopy;  Laterality: N/A;  Diabetic oral  . ESOPHAGOGASTRODUODENOSCOPY  2014   gastritis  . ESOPHAGOGASTRODUODENOSCOPY (EGD) WITH PROPOFOL N/A 04/16/2019   Procedure: ESOPHAGOGASTRODUODENOSCOPY (EGD) WITH PROPOFOL;  Surgeon: Virgel Manifold, MD;  Location: [facility];  Service: Endoscopy;  Laterality: N/A;  . POLYPECTOMY  08/28/2015   Procedure: POLYPECTOMY INTESTINAL;  Surgeon: Lucilla Lame, MD;  Location: [facility];  Service: Endoscopy;;  Rectal polyp  . TUBAL LIGATION Bilateral   . UMBILICAL HERNIA REPAIR N/A 08/09/2016   Procedure: HERNIA REPAIR UMBILICAL ADULT;  Surgeon: Olean Ree, MD;  Location: [facility];  Service: General;  Laterality: N/A;  . VENTRAL HERNIA REPAIR N/A 04/10/2015   Procedure: HERNIA REPAIR VENTRAL ADULT;  Surgeon: Leonie Green, MD;  Location: [facility];  Service: General;  Laterality: N/A;    Social History   Tobacco Use  . Smoking status: Current Every Day Smoker    Packs/day: 0.50    Years: 38.00    Pack years: 19.00    Types: Cigarettes  . Smokeless tobacco: Never Used  . Tobacco comment: Smoke 8 cigerattes a day.   Substance Use Topics  . Alcohol use: Yes    Alcohol/week: 2.0 standard drinks    Types: 2 Cans of beer per week    Comment: Occasionally   . Drug use: No     Medication list has been reviewed and updated.  Current Meds  Medication Sig  . acetaminophen (TYLENOL) 325 MG tablet Take 650 mg by mouth daily as needed.  . Alcohol Swabs (B-D SINGLE USE SWABS REGULAR) PADS 1 each by Does not apply route 2 (two) times daily.  Marland Kitchen amLODipine (NORVASC) 10 MG tablet Take 1 tablet (10 mg total) by mouth daily.  Marland Kitchen anastrozole  (ARIMIDEX) 1 MG tablet Take 1 tablet (1 mg total) by mouth daily.  Marland Kitchen aspirin EC 81 MG tablet Take 81 mg by mouth daily.  Marland Kitchen atorvastatin (LIPITOR) 10 MG tablet Take 1 tablet (10 mg total) by mouth daily.  . Blood Glucose Calibration (TRUE METRIX LEVEL 1) Low SOLN 1 each by In Vitro route daily as needed.  . Blood Glucose Monitoring Suppl (TRUE METRIX METER) DEVI 1 each by Does not apply route daily.  . colchicine 0.6 MG tablet Take 1 tablet (0.6 mg total) by mouth 2 (two) times daily. Up to 5 days as needed  . irbesartan (AVAPRO) 300 MG tablet Take 1 tablet (300 mg total) by mouth daily.  Marland Kitchen lisinopril (ZESTRIL) 40 MG tablet Take 1 tablet (40 mg total) by mouth daily.  . metFORMIN (GLUCOPHAGE-XR) 500 MG 24 hr tablet Take 1 tablet (500 mg total) by mouth daily with breakfast.  . naproxen sodium (ALEVE) 220 MG tablet Take 220 mg by mouth.    PHQ 2/9 Scores 06/03/2019 02/18/2019 10/09/2018 07/09/2018  PHQ - 2 Score 0 0 3 1  PHQ- 9  Score - - 10 -    BP Readings from Last 3 Encounters:  06/03/19 140/76  04/16/19 (!) 198/100  02/18/19 (!) 150/78    Physical Exam Vitals and nursing note reviewed.  Constitutional:      General: She is not in acute distress.    Appearance: Normal appearance. She is well-developed.  HENT:     Head: Normocephalic and atraumatic.  Cardiovascular:     Rate and Rhythm: Normal rate and regular rhythm.     Pulses: Normal pulses.     Heart sounds: No murmur.  Pulmonary:     Effort: Pulmonary effort is normal. No respiratory distress.     Breath sounds: No wheezing or rhonchi.  Abdominal:     General: Abdomen is flat.     Palpations: Abdomen is soft.  Musculoskeletal:        General: Normal range of motion.     Cervical back: Normal range of motion.     Right lower leg: No edema.     Left lower leg: No edema.  Lymphadenopathy:     Cervical: No cervical adenopathy.  Skin:    General: Skin is warm and dry.     Capillary Refill: Capillary refill takes less than  2 seconds.     Findings: No rash.  Neurological:     General: No focal deficit present.     Mental Status: She is alert and oriented to person, place, and time.  Psychiatric:        Behavior: Behavior normal.        Thought Content: Thought content normal.     Wt Readings from Last 3 Encounters:  06/03/19 203 lb (92.1 kg)  04/16/19 204 lb (92.5 kg)  02/18/19 203 lb (92.1 kg)    BP 140/76   Pulse 79   Ht 5\' 8"  (1.727 m)   Wt 203 lb (92.1 kg)   SpO2 97%   BMI 30.87 kg/m   Assessment and Plan: 1. Type II diabetes mellitus with complication (H[facility]) Clinically stable by exam and report without s/s of hypoglycemia. DM complicated by HTN. Tolerating medications metformin well without side effects or other concerns.  Occasional loose stools that do not interfere with daily activities - Hemoglobin A1c  2. Essential (primary) hypertension Clinically stable exam with well controlled BP.   Tolerating medications, Avapro 300 mg and amlodipine 10 mg, without side effects at this time.  Lisinopril was stopped last visit and Avapro started Pt to continue current regimen and low sodium diet; benefits of regular exercise as able discussed. - Comprehensive metabolic panel  3. Gastritis determined by endoscopy Recently treated for H Pylori Symptoms are improved - she plans to have repeat EGD in about 4 months  4. Malignant neoplasm of left breast in female, estrogen receptor positive, unspecified site of breast ([place]) Followed by Oncology and clinically doing well Recent mammogram 08/2018 was normal - yearly mammogram planned Currently getting dental issues addressed so she can start Prolia  5. Secondary and unspecified malignant neoplasm of axilla and upper limb lymph nodes ([place]) Being monitored by Oncology   Partially dictated using Editor, commissioning. Any errors are unintentional.  Halina Maidens, MD [facility]  06/03/2019

## 2019-06-04 LAB — HEMOGLOBIN A1C
Hgb A1c MFr Bld: 6.3 % — ABNORMAL HIGH (ref 4.8–5.6)
Mean Plasma Glucose: 134 mg/dL

## 2019-07-10 ENCOUNTER — Ambulatory Visit: Payer: Medicare HMO | Admitting: Internal Medicine

## 2019-07-10 ENCOUNTER — Ambulatory Visit (INDEPENDENT_AMBULATORY_CARE_PROVIDER_SITE_OTHER): Payer: Medicare Other

## 2019-07-10 VITALS — Ht 68.0 in | Wt 203.0 lb

## 2019-07-10 DIAGNOSIS — Z0000 Encounter for general adult medical examination without abnormal findings: Secondary | ICD-10-CM

## 2019-07-10 NOTE — Patient Instructions (Signed)
[name] , Thank you for taking time to come for your Medicare Wellness Visit. I appreciate your ongoing commitment to your health goals. Please review the following plan we discussed and let me know if I can assist you in the future.   Screening recommendations/referrals: Colonoscopy: done 08/28/15. Repeat in 202. Mammogram: done 09/17/18. Scheduled for 09/19/19. Bone Density: done 01/17/18 Recommended yearly ophthalmology/optometry visit for glaucoma screening and checkup Recommended yearly dental visit for hygiene and checkup  Vaccinations: Influenza vaccine: done 02/18/19 Pneumococcal vaccine: done 01/25/17 Tdap vaccine: due Shingles vaccine: Shingrix discussed. Please contact your pharmacy for coverage information.   Advanced directives: Advance directive discussed with you today. Even though you declined this today please call our office should you change your mind and we can give you the proper paperwork for you to fill out.  Conditions/risks identified: If you wish to quit smoking, help is available. For free tobacco cessation program offerings call the [facility] at 418-817-0105 or Live Well Line at 804 600 7895. You may also visit [email or web address] or email [email or web address] for more information on other programs.   Next appointment: Please follow up in one year for your Medicare Annual Wellness visit.     Preventive Care 60 Years and Older, Female Preventive care refers to lifestyle choices and visits with your health care provider that can promote health and wellness. What does preventive care include?  A yearly physical exam. This is also called an annual well check.  Dental exams once or twice a year.  Routine eye exams. Ask your health care provider how often you should have your eyes checked.  Personal lifestyle choices, including:  Daily care of your teeth and gums.  Regular physical activity.  Eating a healthy diet.  Avoiding  tobacco and drug use.  Limiting alcohol use.  Practicing safe sex.  Taking low-dose aspirin every day.  Taking vitamin and mineral supplements as recommended by your health care provider. What happens during an annual well check? The services and screenings done by your health care provider during your annual well check will depend on your age, overall health, lifestyle risk factors, and family history of disease. Counseling  Your health care provider may ask you questions about your:  Alcohol use.  Tobacco use.  Drug use.  Emotional well-being.  Home and relationship well-being.  Sexual activity.  Eating habits.  History of falls.  Memory and ability to understand (cognition).  Work and work Statistician.  Reproductive health. Screening  You may have the following tests or measurements:  Height, weight, and BMI.  Blood pressure.  Lipid and cholesterol levels. These may be checked every 5 years, or more frequently if you are over 40 years old.  Skin check.  Lung cancer screening. You may have this screening every year starting at age 60 if you have a 30-pack-year history of smoking and currently smoke or have quit within the past 15 years.  Fecal occult blood test (FOBT) of the stool. You may have this test every year starting at age 60.  Flexible sigmoidoscopy or colonoscopy. You may have a sigmoidoscopy every 5 years or a colonoscopy every 10 years starting at age 60.  Hepatitis C blood test.  Hepatitis B blood test.  Sexually transmitted disease (STD) testing.  Diabetes screening. This is done by checking your blood sugar (glucose) after you have not eaten for a while (fasting). You may have this done every 1-3 years.  Bone density scan. This is done to screen  for osteoporosis. You may have this done starting at age 60.  Mammogram. This may be done every 1-2 years. Talk to your health care provider about how often you should have regular mammograms.  Talk with your health care provider about your test results, treatment options, and if necessary, the need for more tests. Vaccines  Your health care provider may recommend certain vaccines, such as:  Influenza vaccine. This is recommended every year.  Tetanus, diphtheria, and acellular pertussis (Tdap, Td) vaccine. You may need a Td booster every 10 years.  Zoster vaccine. You may need this after age 73.  Pneumococcal 13-valent conjugate (PCV13) vaccine. One dose is recommended after age 60.  Pneumococcal polysaccharide (PPSV23) vaccine. One dose is recommended after age 60. Talk to your health care provider about which screenings and vaccines you need and how often you need them. This information is not intended to replace advice given to you by your health care provider. Make sure you discuss any questions you have with your health care provider. Document Released: 06/12/2015 Document Revised: 02/03/2016 Document Reviewed: 03/17/2015 Elsevier Interactive Patient Education  2017 [place] Prevention in the Home Falls can cause injuries. They can happen to people of all ages. There are many things you can do to make your home safe and to help prevent falls. What can I do on the outside of my home?  Regularly fix the edges of walkways and driveways and fix any cracks.  Remove anything that might make you trip as you walk through a door, such as a raised step or threshold.  Trim any bushes or trees on the path to your home.  Use bright outdoor lighting.  Clear any walking paths of anything that might make someone trip, such as rocks or tools.  Regularly check to see if handrails are loose or broken. Make sure that both sides of any steps have handrails.  Any raised decks and porches should have guardrails on the edges.  Have any leaves, snow, or ice cleared regularly.  Use sand or salt on walking paths during winter.  Clean up any spills in your garage right away.  This includes oil or grease spills. What can I do in the bathroom?  Use night lights.  Install grab bars by the toilet and in the tub and shower. Do not use towel bars as grab bars.  Use non-skid mats or decals in the tub or shower.  If you need to sit down in the shower, use a plastic, non-slip stool.  Keep the floor dry. Clean up any water that spills on the floor as soon as it happens.  Remove soap buildup in the tub or shower regularly.  Attach bath mats securely with double-sided non-slip rug tape.  Do not have throw rugs and other things on the floor that can make you trip. What can I do in the bedroom?  Use night lights.  Make sure that you have a light by your bed that is easy to reach.  Do not use any sheets or blankets that are too big for your bed. They should not hang down onto the floor.  Have a firm chair that has side arms. You can use this for support while you get dressed.  Do not have throw rugs and other things on the floor that can make you trip. What can I do in the kitchen?  Clean up any spills right away.  Avoid walking on wet floors.  Keep items that you  use a lot in easy-to-reach places.  If you need to reach something above you, use a strong step stool that has a grab bar.  Keep electrical cords out of the way.  Do not use floor polish or wax that makes floors slippery. If you must use wax, use non-skid floor wax.  Do not have throw rugs and other things on the floor that can make you trip. What can I do with my stairs?  Do not leave any items on the stairs.  Make sure that there are handrails on both sides of the stairs and use them. Fix handrails that are broken or loose. Make sure that handrails are as long as the stairways.  Check any carpeting to make sure that it is firmly attached to the stairs. Fix any carpet that is loose or worn.  Avoid having throw rugs at the top or bottom of the stairs. If you do have throw rugs, attach them  to the floor with carpet tape.  Make sure that you have a light switch at the top of the stairs and the bottom of the stairs. If you do not have them, ask someone to add them for you. What else can I do to help prevent falls?  Wear shoes that:  Do not have high heels.  Have rubber bottoms.  Are comfortable and fit you well.  Are closed at the toe. Do not wear sandals.  If you use a stepladder:  Make sure that it is fully opened. Do not climb a closed stepladder.  Make sure that both sides of the stepladder are locked into place.  Ask someone to hold it for you, if possible.  Clearly mark and make sure that you can see:  Any grab bars or handrails.  First and last steps.  Where the edge of each step is.  Use tools that help you move around (mobility aids) if they are needed. These include:  Canes.  Walkers.  Scooters.  Crutches.  Turn on the lights when you go into a dark area. Replace any light bulbs as soon as they burn out.  Set up your furniture so you have a clear path. Avoid moving your furniture around.  If any of your floors are uneven, fix them.  If there are any pets around you, be aware of where they are.  Review your medicines with your doctor. Some medicines can make you feel dizzy. This can increase your chance of falling. Ask your doctor what other things that you can do to help prevent falls. This information is not intended to replace advice given to you by your health care provider. Make sure you discuss any questions you have with your health care provider. Document Released: 03/12/2009 Document Revised: 10/22/2015 Document Reviewed: 06/20/2014 Elsevier Interactive Patient Education  2017 [employer].

## 2019-07-10 NOTE — Progress Notes (Signed)
Subjective:   Donna Riley is a 59 y.o. female who presents for Medicare Annual (Subsequent) preventive examination.  Virtual Visit via Telephone Note  I connected with [name] on 07/10/19 at 10:40 AM EST by telephone and verified that I am speaking with the correct person using two identifiers.  Medicare Annual Wellness visit completed telephonically due to Covid-19 pandemic.   Location: Patient: home Provider: office   I discussed the limitations, risks, security and privacy concerns of performing an evaluation and management service by telephone and the availability of in person appointments. The patient expressed understanding and agreed to proceed.  Some vital signs may be absent or patient reported.   Clemetine Marker, LPN    Review of Systems:   Cardiac Risk Factors include: diabetes mellitus;dyslipidemia;smoking/ tobacco exposure;hypertension;obesity (BMI >30kg/m2)     Objective:     Vitals: Ht 5\' 8"  (1.727 m)   Wt 203 lb (92.1 kg)   BMI 30.87 kg/m   Body mass index is 30.87 kg/m.  Advanced Directives 07/10/2019 04/16/2019 02/08/2019 12/20/2018 08/08/2018 07/09/2018 01/23/2018  Does Patient Have a Medical Advance Directive? No No No No No No No  Would patient like information on creating a medical advance directive? No - Patient declined - No - Patient declined No - Patient declined No - Patient declined No - Patient declined No - Patient declined    Tobacco Social History   Tobacco Use  Smoking Status Current Every Day Smoker  . Packs/day: 0.50  . Years: 38.00  . Pack years: 19.00  . Types: Cigarettes  Smokeless Tobacco Never Used     Ready to quit: No Counseling given: Not Answered   Clinical Intake:  Pre-visit preparation completed: Yes  Pain : 0-10 Pain Score: 5  Pain Type: Chronic pain Pain Location: Foot Pain Orientation: Right Pain Descriptors / Indicators: Sore Pain Onset: More than a month ago Pain Frequency: Constant     BMI  - recorded: 30.87 Nutritional Status: BMI > 30  Obese Nutritional Risks: None Diabetes: Yes CBG done?: No Did pt. bring in CBG monitor from home?: No   Nutrition Risk Assessment:  Has the patient had any N/V/D within the last 2 months?  No  Does the patient have any non-healing wounds?  No  Has the patient had any unintentional weight loss or weight gain?  No   Diabetes:  Is the patient diabetic?  Yes  If diabetic, was a CBG obtained today?  No  Did the patient bring in their glucometer from home?  No  How often do you monitor your CBG's? 2-3 times per week.   Financial Strains and Diabetes Management:  Are you having any financial strains with the device, your supplies or your medication? No .  Does the patient want to be seen by Chronic Care Management for management of their diabetes?  No  Would the patient like to be referred to a Nutritionist or for Diabetic Management?  No   Diabetic Exams:  Diabetic Eye Exam: Completed 07/27/18 negative retinopathy.   Diabetic Foot Exam: Completed 02/18/19.   How often do you need to have someone help you when you read instructions, pamphlets, or other written materials from your doctor or pharmacy?: 1 - Never  Interpreter Needed?: No  Information entered by :: Clemetine Marker LPN  Past Medical History:  Diagnosis Date  . Arthritis    shoulders and neck  . Breast cancer ([place]) 2016    Left breast with chemo + rad  tx's with lumpectomy.  . Breast wound, left, sequela 06/23/2016  . Diabetes mellitus without complication ([place]) [id number]  . Elevated LFTs   . Fatigue 08/01/2016  . GERD (gastroesophageal reflux disease)   . Gout   . Hydradenitis 01/23/2015  . Hyperlipidemia   . Hypertension   . Mastitis 10/15/2016  . Personal history of chemotherapy   . Personal history of chemotherapy   . Personal history of radiation therapy 2016   LEFT lumpectomy  . Personal history of radiation therapy   . Positive PPD, treated   . Shortness of breath  dyspnea    with exertion   Past Surgical History:  Procedure Laterality Date  . BREAST BIOPSY Left 07/31/2014   positive. Lumpectomy 08/27/2014 with chemo and rad  . BREAST BIOPSY Left 03/08/2016   INTRADUCTAL PAPILLOMA WITH SCLEROSIS AND CALCIFICATIONS  . BREAST BIOPSY Left 03/08/2016   FAT NECROSIS WITH FIBROSIS  . 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 BREAST LUMPECTOMY WITH NEEDLE LOCALIZATION Left 04/05/2016   Procedure: BREAST LUMPECTOMY WITH NEEDLE LOCALIZATION;  Surgeon: Hubbard Robinson, MD;  Location: [facility];  Service: General;  Laterality: Left;  . BREAST LUMPECTOMY WITH NEEDLE LOCALIZATION AND AXILLARY SENTINEL LYMPH NODE BX Left 08/27/14  . CHOLECYSTECTOMY  09/07/2013  . COLONOSCOPY WITH PROPOFOL N/A 08/28/2015   Procedure: COLONOSCOPY WITH PROPOFOL;  Surgeon: Lucilla Lame, MD;  Location: [facility];  Service: Endoscopy;  Laterality: N/A;  Diabetic oral  . ESOPHAGOGASTRODUODENOSCOPY  2014   gastritis  . ESOPHAGOGASTRODUODENOSCOPY (EGD) WITH PROPOFOL N/A 04/16/2019   Procedure: ESOPHAGOGASTRODUODENOSCOPY (EGD) WITH PROPOFOL;  Surgeon: Virgel Manifold, MD;  Location: [facility];  Service: Endoscopy;  Laterality: N/A;  . POLYPECTOMY  08/28/2015   Procedure: POLYPECTOMY INTESTINAL;  Surgeon: Lucilla Lame, MD;  Location: [facility];  Service: Endoscopy;;  Rectal polyp  . TUBAL LIGATION Bilateral   . UMBILICAL HERNIA REPAIR N/A 08/09/2016   Procedure: HERNIA REPAIR UMBILICAL ADULT;  Surgeon: Olean Ree, MD;  Location: [facility];  Service: General;  Laterality: N/A;  . VENTRAL HERNIA REPAIR N/A 04/10/2015   Procedure: HERNIA REPAIR VENTRAL ADULT;  Surgeon: Leonie Green, MD;  Location: [facility];  Service: General;  Laterality: N/A;   Family History  Problem Relation Age of Onset  . Cancer Mother        Pancreatic  . Cancer Father        Throat  . Healthy Sister   . HIV Brother   . Cancer Maternal Grandmother         Breast  . Breast cancer Maternal Grandmother 75   Social History   Socioeconomic History  . Marital status: Widowed    Spouse name: Not on file  . Number of children: 4  . Years of education: Not on file  . Highest education level: 10th grade  Occupational History  . Occupation: Disabled  Tobacco Use  . Smoking status: Current Every Day Smoker    Packs/day: 0.50    Years: 38.00    Pack years: 19.00    Types: Cigarettes  . Smokeless tobacco: Never Used  Substance and Sexual Activity  . Alcohol use: Yes    Alcohol/week: 2.0 standard drinks    Types: 2 Cans of beer per week    Comment: Occasionally   . Drug use: No  . Sexual activity: Not Currently  Other Topics Concern  . Not on file  Social History Narrative   Pt lives alone, son lives 2 doors  down.   Social Determinants of Health   Financial Resource Strain: Low Risk   . Difficulty of Paying Living Expenses: Not very hard  Food Insecurity: No Food Insecurity  . Worried About Charity fundraiser in the Last Year: Never true  . Ran Out of Food in the Last Year: Never true  Transportation Needs: No Transportation Needs  . Lack of Transportation (Medical): No  . Lack of Transportation (Non-Medical): No  Physical Activity: Insufficiently Active  . Days of Exercise per Week: 2 days  . Minutes of Exercise per Session: 30 min  Stress: Stress Concern Present  . Feeling of Stress : Rather much  Social Connections: Somewhat Isolated  . Frequency of Communication with Friends and Family: More than three times a week  . Frequency of Social Gatherings with Friends and Family: Three times a week  . Attends Religious Services: More than 4 times per year  . Active Member of Clubs or Organizations: No  . Attends Archivist Meetings: Never  . Marital Status: Widowed    Outpatient Encounter Medications as of 07/10/2019  Medication Sig  . acetaminophen (TYLENOL) 325 MG tablet Take 650 mg by mouth daily as needed.  .  Alcohol Swabs (B-D SINGLE USE SWABS REGULAR) PADS 1 each by Does not apply route 2 (two) times daily.  Marland Kitchen amLODipine (NORVASC) 10 MG tablet Take 1 tablet (10 mg total) by mouth daily.  Marland Kitchen anastrozole (ARIMIDEX) 1 MG tablet Take 1 tablet (1 mg total) by mouth daily.  Marland Kitchen aspirin EC 81 MG tablet Take 81 mg by mouth daily.  Marland Kitchen atorvastatin (LIPITOR) 10 MG tablet Take 1 tablet (10 mg total) by mouth daily. (Patient taking differently: Take 10 mg by mouth once a week. )  . Blood Glucose Calibration (TRUE METRIX LEVEL 1) Low SOLN 1 each by In Vitro route daily as needed.  . Blood Glucose Monitoring Suppl (TRUE METRIX METER) DEVI 1 each by Does not apply route daily.  . irbesartan (AVAPRO) 300 MG tablet Take 1 tablet (300 mg total) by mouth daily.  . metFORMIN (GLUCOPHAGE-XR) 500 MG 24 hr tablet Take 1 tablet (500 mg total) by mouth daily with breakfast.  . naproxen sodium (ALEVE) 220 MG tablet Take 220 mg by mouth.  . [DISCONTINUED] colchicine 0.6 MG tablet Take 1 tablet (0.6 mg total) by mouth 2 (two) times daily. Up to 5 days as needed   No facility-administered encounter medications on file as of 07/10/2019.    Activities of Daily Living In your present state of health, do you have any difficulty performing the following activities: 07/10/2019  Hearing? N  Comment declines hearing aids  Vision? N  Difficulty concentrating or making decisions? N  Walking or climbing stairs? N  Dressing or bathing? N  Doing errands, shopping? N  Preparing Food and eating ? N  Using the Toilet? N  In the past six months, have you accidently leaked urine? N  Do you have problems with loss of bowel control? N  Managing your Medications? N  Managing your Finances? N  Housekeeping or managing your Housekeeping? N  Some recent data might be hidden    Patient Care Team: Glean Hess, MD as PCP - General (Internal Medicine) Virgel Manifold, MD as Consulting Physician (Gastroenterology) Lequita Asal,  MD as Referring Physician (Hematology and Oncology)    Assessment:   This is a routine wellness examination for [place]e.  Exercise Activities and Dietary recommendations Current Exercise Habits: Home  exercise routine, Type of exercise: calisthenics, Time (Minutes): 30, Frequency (Times/Week): 2, Weekly Exercise (Minutes/Week): 60, Intensity: Mild, Exercise limited by: None identified  Goals    . Exercise 150 min/wk Moderate Activity     Recommend to exercise at least 150 minutes per week     . Quit smoking / using tobacco       Fall Risk Fall Risk  07/10/2019 10/09/2018 07/09/2018 11/08/2017 05/18/2017  Falls in the past year? 0 0 1 No No  Number falls in past yr: 0 0 1 - -  Comment - - fell at night one time when going to the bathroom - -  Injury with Fall? 0 0 0 - -  Risk for fall due to : No Fall Risks - - - -  Follow up Falls prevention discussed Falls evaluation completed Falls prevention discussed - -   FALL RISK PREVENTION PERTAINING TO THE HOME:  Any stairs in or around the home? Yes  If so, do they handrails? Yes   Home free of loose throw rugs in walkways, pet beds, electrical cords, etc? Yes  Adequate lighting in your home to reduce risk of falls? Yes   ASSISTIVE DEVICES UTILIZED TO PREVENT FALLS:  Life alert? No  Use of a cane, walker or w/c? No  Grab bars in the bathroom? Yes  Shower chair or bench in shower? No  Elevated toilet seat or a handicapped toilet? No   DME ORDERS:  DME order needed?  No   TIMED UP AND GO:  Was the test performed? No . Telephonic visit.   Education: Fall risk prevention has been discussed.  Intervention(s) required? No   Depression Screen PHQ 2/9 Scores 07/10/2019 07/10/2019 06/03/2019 02/18/2019  PHQ - 2 Score 1 0 0 0  PHQ- 9 Score 4 - - -     Cognitive Function     6CIT Screen 07/10/2019 07/09/2018 05/18/2017  What Year? 0 points 0 points 0 points  What month? 0 points 0 points 0 points  What time? 0 points 0 points 0  points  Count back from 20 0 points 0 points 0 points  Months in reverse 0 points 0 points 2 points  Repeat phrase 0 points 0 points 8 points  Total Score 0 0 10    Immunization History  Administered Date(s) Administered  . Influenza Inj Mdck Quad With Preservative 03/12/2018  . Influenza,inj,Quad PF,6+ Mos 02/22/2016, 02/18/2019  . Influenza-Unspecified 01/30/2014, 02/25/2017, 03/12/2018  . Pneumococcal Polysaccharide-23 01/25/2017    Qualifies for Shingles Vaccine? Yes . Due for Shingrix. Education has been provided regarding the importance of this vaccine. Pt has been advised to call insurance company to determine out of pocket expense. Advised may also receive vaccine at local pharmacy or Health Dept. Verbalized acceptance and understanding.  Tdap: Although this vaccine is not a covered service during a Wellness Exam, does the patient still wish to receive this vaccine today?  No .  Education has been provided regarding the importance of this vaccine. Advised may receive this vaccine at local pharmacy or Health Dept. Aware to provide a copy of the vaccination record if obtained from local pharmacy or Health Dept. Verbalized acceptance and understanding.  Flu Vaccine: Up to date  Pneumococcal Vaccine: Up to date   Screening Tests Health Maintenance  Topic Date Due  . TETANUS/TDAP  05/30/2020 (Originally 02/26/1980)  . OPHTHALMOLOGY EXAM  07/28/2019  . MAMMOGRAM  09/17/2019  . HEMOGLOBIN A1C  12/01/2019  . FOOT EXAM  02/18/2020  .  PAP SMEAR-Modifier  07/19/2020  . COLONOSCOPY  08/27/2020  . DEXA SCAN  01/18/2028  . INFLUENZA VACCINE  Completed  . PNEUMOCOCCAL POLYSACCHARIDE VACCINE AGE 8-64 HIGH RISK  Completed  . HIV Screening  Completed  . Hepatitis C Screening  Addressed   Cancer Screenings:  Colorectal Screening: Completed 08/28/15. Repeat every 5 years;   Mammogram: Completed 09/17/18. Repeat every year; scheduled for 09/19/19.   Bone Density: Completed 01/17/18.  Results reflect OSTEOPOROSIS. Repeat every 2 years.   Lung Cancer Screening: (Low Dose CT Chest recommended if Age 84-80 years, 30 pack-year currently smoking OR have quit w/in 15years.) does not qualify.    Additional Screening:  Hepatitis C Screening: does qualify; Completed 08/28/13  Vision Screening: Recommended annual ophthalmology exams for early detection of glaucoma and other disorders of the eye. Is the patient up to date with their annual eye exam?  Yes  Who is the provider or what is the name of the office in which the pt attends annual eye exams? Dr. Ellin Mayhew  Dental Screening: Recommended annual dental exams for proper oral hygiene  Community Resource Referral:  CRR required this visit?  No      Plan:     I have personally reviewed and addressed the Medicare Annual Wellness questionnaire and have noted the following in the patient's chart:  A. Medical and social history B. Use of alcohol, tobacco or illicit drugs  C. Current medications and supplements D. Functional ability and status E.  Nutritional status F.  Physical activity G. Advance directives H. List of other physicians I.  Hospitalizations, surgeries, and ER visits in previous 12 months J.  [place] such as hearing and vision if needed, cognitive and depression L. Referrals and appointments   In addition, I have reviewed and discussed with patient certain preventive protocols, quality metrics, and best practice recommendations. A written personalized care plan for preventive services as well as general preventive health recommendations were provided to patient.   Signed,  Clemetine Marker, LPN Nurse Health Advisor   Nurse Notes:

## 2019-07-11 ENCOUNTER — Other Ambulatory Visit: Payer: Self-pay | Admitting: Internal Medicine

## 2019-07-11 DIAGNOSIS — I10 Essential (primary) hypertension: Secondary | ICD-10-CM

## 2019-08-07 ENCOUNTER — Encounter: Payer: Self-pay | Admitting: Surgery

## 2019-08-07 ENCOUNTER — Other Ambulatory Visit: Payer: Self-pay

## 2019-08-07 ENCOUNTER — Ambulatory Visit (INDEPENDENT_AMBULATORY_CARE_PROVIDER_SITE_OTHER): Payer: Medicare Other | Admitting: Surgery

## 2019-08-07 VITALS — BP 213/136 | HR 71 | Temp 96.6°F | Ht 69.0 in | Wt 202.4 lb

## 2019-08-07 DIAGNOSIS — L723 Sebaceous cyst: Secondary | ICD-10-CM

## 2019-08-07 NOTE — Progress Notes (Signed)
08/07/2019  Reason for Visit:  Left upper neck cyst  Referring Provider:  Mariana Arn, MD  History of Present Illness: Donna Riley is a 59 y.o. female presenting for evaluation of an infected sebaceous cyst of the left upper neck.  Patient reports that she's had it for about 2-3 years and had not caused any problems before.  However, she presented to her PCP's office on 07/30/19 because it had become red and tender to palpation.  She has also coincidentally been having some headaches on the left side of the forehead during this time, but unclear if the two are related.  She was given a prescription for  Doxycycline for 1 week course and referred to [place]. Currently, the area is not tender and has not drained.    Of note, during her visit at her PCP's office, her BP was 195/96, and today's BP was 213/136, which was repeated multiple times with similar results.  Past Medical History: Past Medical History:  Diagnosis Date  . Arthritis    shoulders and neck  . Breast cancer ([place]) 2016    Left breast with chemo + rad tx's with lumpectomy.  . Breast wound, left, sequela 06/23/2016  . Diabetes mellitus without complication ([place]) Q000111Q  . Elevated LFTs   . Fatigue 08/01/2016  . GERD (gastroesophageal reflux disease)   . Gout   . Hydradenitis 01/23/2015  . Hyperlipidemia   . Hypertension   . Mastitis 10/15/2016  . Personal history of chemotherapy   . Personal history of chemotherapy   . Personal history of radiation therapy 2016   LEFT lumpectomy  . Personal history of radiation therapy   . Positive PPD, treated   . Shortness of breath dyspnea    with exertion     Past Surgical History: Past Surgical History:  Procedure Laterality Date  . BREAST BIOPSY Left 07/31/2014   positive. Lumpectomy 08/27/2014 with chemo and rad  . BREAST BIOPSY Left 03/08/2016   INTRADUCTAL PAPILLOMA WITH SCLEROSIS AND CALCIFICATIONS  . BREAST BIOPSY Left 03/08/2016   FAT NECROSIS WITH FIBROSIS  . BREAST  LUMPECTOMY Left 2016   INVASIVE MAMMARY CARCINOMA,DCIS  . BREAST LUMPECTOMY WITH NEEDLE LOCALIZATION Left 04/05/2016   Procedure: BREAST LUMPECTOMY WITH NEEDLE LOCALIZATION;  Surgeon: Hubbard Robinson, MD;  Location: [facility];  Service: General;  Laterality: Left;  . BREAST LUMPECTOMY WITH NEEDLE LOCALIZATION AND AXILLARY SENTINEL LYMPH NODE BX Left 08/27/14  . CHOLECYSTECTOMY  09/07/2013  . COLONOSCOPY WITH PROPOFOL N/A 08/28/2015   Procedure: COLONOSCOPY WITH PROPOFOL;  Surgeon: Lucilla Lame, MD;  Location: [facility];  Service: Endoscopy;  Laterality: N/A;  Diabetic oral  . ESOPHAGOGASTRODUODENOSCOPY  2014   gastritis  . ESOPHAGOGASTRODUODENOSCOPY (EGD) WITH PROPOFOL N/A 04/16/2019   Procedure: ESOPHAGOGASTRODUODENOSCOPY (EGD) WITH PROPOFOL;  Surgeon: Virgel Manifold, MD;  Location: [facility];  Service: Endoscopy;  Laterality: N/A;  . POLYPECTOMY  08/28/2015   Procedure: POLYPECTOMY INTESTINAL;  Surgeon: Lucilla Lame, MD;  Location: [facility];  Service: Endoscopy;;  Rectal polyp  . TUBAL LIGATION Bilateral   . UMBILICAL HERNIA REPAIR N/A 08/09/2016   Procedure: HERNIA REPAIR UMBILICAL ADULT;  Surgeon: Olean Ree, MD;  Location: [facility];  Service: General;  Laterality: N/A;  . VENTRAL HERNIA REPAIR N/A 04/10/2015   Procedure: HERNIA REPAIR VENTRAL ADULT;  Surgeon: Leonie Green, MD;  Location: [facility];  Service: General;  Laterality: N/A;    Home Medications: Prior to Admission medications   Medication Sig Start Date End Date  Taking? Authorizing Provider  acetaminophen (TYLENOL) 325 MG tablet Take 650 mg by mouth daily as needed. 09/08/13  Yes [provider]  Alcohol Swabs (B-D SINGLE USE SWABS REGULAR) PADS 1 each by Does not apply route 2 (two) times daily. 11/22/17  Yes Glean Hess, MD  amLODipine (NORVASC) 10 MG tablet Take 1 tablet (10 mg total) by mouth daily. 10/09/18  Yes Glean Hess, MD  anastrozole (ARIMIDEX) 1 MG tablet Take  1 tablet (1 mg total) by mouth daily. 02/28/19  Yes Lequita Asal, MD  aspirin EC 81 MG tablet Take 81 mg by mouth daily.   Yes [provider]  atorvastatin (LIPITOR) 10 MG tablet Take 1 tablet (10 mg total) by mouth daily. Patient taking differently: Take 10 mg by mouth once a week.  04/12/18  Yes Glean Hess, MD  Blood Glucose Calibration (TRUE METRIX LEVEL 1) Low SOLN 1 each by In Vitro route daily as needed. 11/22/17  Yes Glean Hess, MD  Blood Glucose Monitoring Suppl (TRUE METRIX METER) DEVI 1 each by Does not apply route daily. 12/15/17  Yes Glean Hess, MD  irbesartan (AVAPRO) 300 MG tablet TAKE 1 TABLET BY MOUTH  DAILY 07/11/19  Yes Glean Hess, MD  metFORMIN (GLUCOPHAGE-XR) 500 MG 24 hr tablet Take 1 tablet (500 mg total) by mouth daily with breakfast. 10/09/18  Yes Glean Hess, MD  naproxen sodium (ALEVE) 220 MG tablet Take 220 mg by mouth.   Yes [provider]  hydrochlorothiazide (HYDRODIURIL) 25 MG tablet Take by mouth.    [provider]  pantoprazole (PROTONIX) 20 MG tablet TAKE ONE TABLET BY MOUTH TWICE DAILY FOR FOURTEEN DAYS 04/24/19   [provider]    Allergies: No Known Allergies  Social History:  reports that she has been smoking cigarettes. She has a 19.00 pack-year smoking history. She has never used smokeless tobacco. She reports current alcohol use of about 2.0 standard drinks of alcohol per week. She reports that she does not use drugs.   Family History: Family History  Problem Relation Age of Onset  . Cancer Mother        Pancreatic  . Cancer Father        Throat  . Healthy Sister   . HIV Brother   . Cancer Maternal Grandmother        Breast  . Breast cancer Maternal Grandmother 61    Review of Systems: Review of Systems  Constitutional: Negative for chills and fever.  Respiratory: Negative for shortness of breath.   Cardiovascular: Negative for chest pain.  Gastrointestinal:  Negative for nausea and vomiting.  Skin:       Left upper neck cyst  Neurological: Positive for headaches.    Physical Exam BP (!) 213/136   Pulse 71   Temp (!) 96.6 F (35.9 C) (Temporal)   Ht 5\' 9"  (1.753 m)   Wt 202 lb 6.4 oz (91.8 kg)   SpO2 99%   BMI 29.89 kg/m  CONSTITUTIONAL: No acute distress HEENT:  Normocephalic, atraumatic, extraocular motion intact. NECK: Trachea is midline, and there is no jugular venous distension.  RESPIRATORY:  Lungs are clear, and breath sounds are equal bilaterally. Normal respiratory effort without pathologic use of accessory muscles. CARDIOVASCULAR: Heart is regular without murmurs, gallops, or rubs. MUSCULOSKELETAL:  Normal muscle strength and tone in all four extremities.  No peripheral edema or cyanosis. SKIN: Left upper neck in the post-auricular area, has a cyst measuring  about 1.5 cm in size, with a black head pore, consistent with a sebaceous cyst.  It is non-tender, and there is no erythema or induration or drainage.  NEUROLOGIC:  Motor and sensation is grossly normal.  Cranial nerves are grossly intact. PSYCH:  Alert and oriented to person, place and time. Affect is normal.  Laboratory Analysis: No results found for this or any previous visit (from the past 24 hour(s)).  Imaging: No results found.  Assessment and Plan: This is a 59 y.o. female with a sebaceous cyst of the left upper neck  --Discussed with the patient that the cyst could be excised in the office setting, but currently her blood pressure very high and not well controlled, considering that it was also very elevated when she was seen on 07/30/19.  Discussed with her that she should contact her PCP as soon as possible to be seen for blood pressure control.  At this point, we would not proceed with any cyst excision.  We'll set up a procedure visit for her in two weeks, but if her pressure remains that elevated, we would postpone again.  Patient understands this plan and will  contact her PCP tomorrow.  Face-to-face time spent with the patient and care providers was 40 minutes, with more than 50% of the time spent counseling, educating, and coordinating care of the patient.     Melvyn Neth, [facility]

## 2019-08-07 NOTE — Patient Instructions (Signed)
Dr.Piscoya would like for patient to follow up in two weeks for a Procedure visit.

## 2019-08-08 ENCOUNTER — Ambulatory Visit: Payer: Medicare Other | Admitting: Internal Medicine

## 2019-08-08 ENCOUNTER — Inpatient Hospital Stay: Payer: Medicare Other | Admitting: Nurse Practitioner

## 2019-08-08 ENCOUNTER — Other Ambulatory Visit: Payer: Self-pay

## 2019-08-08 ENCOUNTER — Inpatient Hospital Stay: Payer: Medicare Other

## 2019-08-08 ENCOUNTER — Inpatient Hospital Stay: Payer: Medicare Other | Attending: Nurse Practitioner

## 2019-08-08 DIAGNOSIS — C50912 Malignant neoplasm of unspecified site of left female breast: Secondary | ICD-10-CM

## 2019-08-08 DIAGNOSIS — M810 Age-related osteoporosis without current pathological fracture: Secondary | ICD-10-CM | POA: Insufficient documentation

## 2019-08-08 DIAGNOSIS — M1990 Unspecified osteoarthritis, unspecified site: Secondary | ICD-10-CM | POA: Insufficient documentation

## 2019-08-08 DIAGNOSIS — Z170 Estrogen receptor positive status [ER+]: Secondary | ICD-10-CM | POA: Insufficient documentation

## 2019-08-08 DIAGNOSIS — I10 Essential (primary) hypertension: Secondary | ICD-10-CM | POA: Diagnosis not present

## 2019-08-08 DIAGNOSIS — Z79899 Other long term (current) drug therapy: Secondary | ICD-10-CM | POA: Insufficient documentation

## 2019-08-08 DIAGNOSIS — Z79811 Long term (current) use of aromatase inhibitors: Secondary | ICD-10-CM | POA: Diagnosis not present

## 2019-08-08 LAB — COMPREHENSIVE METABOLIC PANEL
ALT: 20 U/L (ref 0–44)
AST: 19 U/L (ref 15–41)
Albumin: 4.1 g/dL (ref 3.5–5.0)
Alkaline Phosphatase: 94 U/L (ref 38–126)
Anion gap: 11 (ref 5–15)
BUN: 9 mg/dL (ref 6–20)
CO2: 23 mmol/L (ref 22–32)
Calcium: 8.9 mg/dL (ref 8.9–10.3)
Chloride: 103 mmol/L (ref 98–111)
Creatinine, Ser: 0.74 mg/dL (ref 0.44–1.00)
GFR calc Af Amer: >60 mL/min (ref >60)
GFR calc non Af Amer: >60 mL/min (ref >60)
Glucose, Bld: 105 mg/dL — ABNORMAL HIGH (ref 70–99)
Potassium: 3.8 mmol/L (ref 3.5–5.1)
Sodium: 137 mmol/L (ref 135–145)
Total Bilirubin: 0.5 mg/dL (ref 0.3–1.2)
Total Protein: 7.6 g/dL (ref 6.5–8.1)

## 2019-08-08 LAB — CBC WITH DIFFERENTIAL/PLATELET
Abs Immature Granulocytes: 0.01 10*3/uL (ref 0.00–0.07)
Basophils Absolute: 0.1 10*3/uL (ref 0.0–0.1)
Basophils Relative: 1 %
Eosinophils Absolute: 0.1 10*3/uL (ref 0.0–0.5)
Eosinophils Relative: 2 %
HCT: 41.6 % (ref 36.0–46.0)
Hemoglobin: 13.2 g/dL (ref 12.0–15.0)
Immature Granulocytes: 0 %
Lymphocytes Relative: 37 %
Lymphs Abs: 2.4 10*3/uL (ref 0.7–4.0)
MCH: 27.3 pg (ref 26.0–34.0)
MCHC: 31.7 g/dL (ref 30.0–36.0)
MCV: 86 fL (ref 80.0–100.0)
Monocytes Absolute: 0.4 10*3/uL (ref 0.1–1.0)
Monocytes Relative: 6 %
Neutro Abs: 3.6 10*3/uL (ref 1.7–7.7)
Neutrophils Relative %: 54 %
Platelets: 209 10*3/uL (ref 150–400)
RBC: 4.84 MIL/uL (ref 3.87–5.11)
RDW: 14.4 % (ref 11.5–15.5)
WBC: 6.5 10*3/uL (ref 4.0–10.5)
nRBC: 0 % (ref 0.0–0.2)

## 2019-08-09 LAB — CANCER ANTIGEN 27.29: CA 27.29: 19.2 U/mL (ref 0.0–38.6)

## 2019-08-10 ENCOUNTER — Other Ambulatory Visit: Payer: Self-pay | Admitting: Nurse Practitioner

## 2019-08-10 NOTE — Progress Notes (Signed)
[facility]  [address], [address] [place], [place] [address] Phone: [phone]  Fax: [phone]   Clinic Day:  08/13/2019  Referring physician: Glean Hess, MD  Chief Complaint: Donna Riley is a 59 y.o. female with stage IIA left breast cancer who is seen for a 6 month assessment.    HPI: The patient was last seen in the medical oncology clinic on 02/08/2019. At that time, she had intermittent shooting pain in her breat. Exam was stable. Hematocrit 44.4, hemoglobin 14.3, platelets 225,000, WBC 7,900. CMP was normal. CA 27.29 was 8.8. She continued Arimidex.   EGD with Dr. Bonna Gains on 04/16/2019 showed a normal esophagus. There was erythematous mucosa in the antrum.  Pathology revealed chronic active gastritis with helicobacter pylori type organisms. There was intestinal metaplasia, but no dysplasia or malignancy.  Duodenal bulb and second portion of the duodenum were normal.  She was treated with antibiotics x 2 weeks.  Plan was for H pylori breath test 4 weeks after treatment.  She will have a repeat EGD in 6 months.  She was seen at [facility] by Dr. Sherilyn Cooter on 07/30/2019 for a knot behind the left ear. Knot had been present for 2 years. She had redness, tenderness and palpation for 1 week. She had some left sided headaches. She denied any drainage. No incision and drainage was performed. She was prescribed a course of doxycycline. She was referred to surgery.   She was seen by Dr. Hampton Abbot on 08/07/2019 for evaluation of an infected sebaceous cyst of the left upper neck.  The lesion had been present for 2-3 years, but had recently become tender to palpation. She noted left sided headaches. Exam revealed a 1.5 cm cyst with a black head pore c/w a sebaceous cyst.  Cyst excision was postponed secondary to her elevated blood pressure (213/136).   Labs on 08/08/2019: hematocrit 41.6, hemoglobin 13.2, platelets 209,000, WBC 6,500. CMP was normal. CA 27.29 was  19.2.   During the interim, she has done well.  She notes that she had no been contacted for her H pylori breath test.  I advised her to call GI and have the test done. Patient understood and noted she would call today. She denies any abdominal pain. She notes all her teeth were pulled and she has a top plate.   She notes her weight fluctuates. She continues to have back pain; she no longer receives steroid injections.  Her shoulder pain is better. She reports insomnia. She notes chronic sharp pains in both breast. She performs monthly breast exams.  She denies any concerns.  She is interested in BCI testing.  She continues Arimidex.   Her BP was 194/84 today in the clinic. She has been monitoring her BP at home and it has been running high.  After today's visit she reports that see will go and see Dr. Army Melia.   Past Medical History:  Diagnosis Date  . Arthritis    shoulders and neck  . Breast cancer ([place]) 2016    Left breast with chemo + rad tx's with lumpectomy.  . Breast wound, left, sequela 06/23/2016  . Diabetes mellitus without complication ([place]) 07/158  . Elevated LFTs   . Fatigue 08/01/2016  . GERD (gastroesophageal reflux disease)   . Gout   . Hydradenitis 01/23/2015  . Hyperlipidemia   . Hypertension   . Mastitis 10/15/2016  . Personal history of chemotherapy   . Personal history of chemotherapy   . Personal history  of radiation therapy 2016   LEFT lumpectomy  . Personal history of radiation therapy   . Positive PPD, treated   . Shortness of breath dyspnea    with exertion    Past Surgical History:  Procedure Laterality Date  . BREAST BIOPSY Left 07/31/2014   positive. Lumpectomy 08/27/2014 with chemo and rad  . BREAST BIOPSY Left 03/08/2016   INTRADUCTAL PAPILLOMA WITH SCLEROSIS AND CALCIFICATIONS  . BREAST BIOPSY Left 03/08/2016   FAT NECROSIS WITH FIBROSIS  . 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 BREAST LUMPECTOMY WITH NEEDLE  LOCALIZATION Left 04/05/2016   Procedure: BREAST LUMPECTOMY WITH NEEDLE LOCALIZATION;  Surgeon: Hubbard Robinson, MD;  Location: [facility];  Service: General;  Laterality: Left;  . BREAST LUMPECTOMY WITH NEEDLE LOCALIZATION AND AXILLARY SENTINEL LYMPH NODE BX Left 08/27/14  . CHOLECYSTECTOMY  09/07/2013  . COLONOSCOPY WITH PROPOFOL N/A 08/28/2015   Procedure: COLONOSCOPY WITH PROPOFOL;  Surgeon: Lucilla Lame, MD;  Location: [facility];  Service: Endoscopy;  Laterality: N/A;  Diabetic oral  . ESOPHAGOGASTRODUODENOSCOPY  2014   gastritis  . ESOPHAGOGASTRODUODENOSCOPY (EGD) WITH PROPOFOL N/A 04/16/2019   Procedure: ESOPHAGOGASTRODUODENOSCOPY (EGD) WITH PROPOFOL;  Surgeon: Virgel Manifold, MD;  Location: [facility];  Service: Endoscopy;  Laterality: N/A;  . POLYPECTOMY  08/28/2015   Procedure: POLYPECTOMY INTESTINAL;  Surgeon: Lucilla Lame, MD;  Location: [facility];  Service: Endoscopy;;  Rectal polyp  . TUBAL LIGATION Bilateral   . UMBILICAL HERNIA REPAIR N/A 08/09/2016   Procedure: HERNIA REPAIR UMBILICAL ADULT;  Surgeon: Olean Ree, MD;  Location: [facility];  Service: General;  Laterality: N/A;  . VENTRAL HERNIA REPAIR N/A 04/10/2015   Procedure: HERNIA REPAIR VENTRAL ADULT;  Surgeon: Leonie Green, MD;  Location: [facility];  Service: General;  Laterality: N/A;    Family History  Problem Relation Age of Onset  . Cancer Mother        Pancreatic  . Cancer Father        Throat  . Healthy Sister   . HIV Brother   . Cancer Maternal Grandmother        Breast  . Breast cancer Maternal Grandmother 75    Social History:  reports that she has been smoking cigarettes. She has a 19.00 pack-year smoking history. She has never used smokeless tobacco. She reports current alcohol use of about 2.0 standard drinks of alcohol per week. She reports that she does not use drugs.  She lost her husband in 05/2018.  She lives in [place]. The patient is alone today.  Allergies: No  Known Allergies  Current Medications: Current Outpatient Medications  Medication Sig Dispense Refill  . acetaminophen (TYLENOL) 325 MG tablet Take 650 mg by mouth daily as needed.    . Alcohol Swabs (B-D SINGLE USE SWABS REGULAR) PADS 1 each by Does not apply route 2 (two) times daily. 100 each 3  . amLODipine (NORVASC) 10 MG tablet Take 1 tablet (10 mg total) by mouth daily. 90 tablet 3  . anastrozole (ARIMIDEX) 1 MG tablet Take 1 tablet (1 mg total) by mouth daily. 30 tablet 0  . aspirin EC 81 MG tablet Take 81 mg by mouth daily.    Marland Kitchen atorvastatin (LIPITOR) 10 MG tablet Take 1 tablet (10 mg total) by mouth daily. (Patient taking differently: Take 10 mg by mouth once a week. ) 90 tablet 1  . Blood Glucose Calibration (TRUE METRIX LEVEL 1) Low SOLN 1 each by In Vitro route  daily as needed. 1 each 3  . Blood Glucose Monitoring Suppl (TRUE METRIX METER) DEVI 1 each by Does not apply route daily. 100 Device 3  . hydrochlorothiazide (HYDRODIURIL) 25 MG tablet Take 25 mg by mouth daily.     . irbesartan (AVAPRO) 300 MG tablet TAKE 1 TABLET BY MOUTH  DAILY 90 tablet 3  . metFORMIN (GLUCOPHAGE-XR) 500 MG 24 hr tablet Take 1 tablet (500 mg total) by mouth daily with breakfast. 90 tablet 3  . naproxen sodium (ALEVE) 220 MG tablet Take 220 mg by mouth daily as needed.      No current facility-administered medications for this visit.    Review of Systems  Constitutional: Negative.  Negative for chills, diaphoresis, fever, malaise/fatigue and weight loss (fluctuates).       Feels "alright".  HENT: Negative.  Negative for congestion, ear pain, nosebleeds, sinus pain and sore throat.        All teeth extracted. Top plate.  Eyes: Negative.  Negative for blurred vision and double vision.  Respiratory: Negative.  Negative for cough, sputum production, shortness of breath and wheezing.   Cardiovascular: Positive for chest pain (bilateral breast sharp pain- chronic). Negative for palpitations, orthopnea,  leg swelling and PND.       Hypertension.  Gastrointestinal: Negative for abdominal pain, blood in stool, constipation, diarrhea, melena, nausea and vomiting.       Gastroparesis.  Interval h/o H pylori gastritis.  Genitourinary: Negative for dysuria, frequency, hematuria and urgency.  Musculoskeletal: Positive for back pain (chronic lower back- no longer receiving injections). Negative for falls, joint pain, myalgias and neck pain.       Shoulder pain- better.  Skin: Negative.  Negative for itching and rash.  Neurological: Negative.  Negative for dizziness, tremors, sensory change, speech change, focal weakness, weakness and headaches.  Endo/Heme/Allergies: Negative.  Does not bruise/bleed easily.  Psychiatric/Behavioral: Negative for depression and memory loss. The patient has insomnia. The patient is not nervous/anxious.   All other systems reviewed and are negative.  Performance status (ECOG): 1  Vitals Blood pressure (!) 194/84, pulse 74, temperature (!) 96.6 F (35.9 C), temperature source Tympanic, resp. rate 18, height '5\' 9"'$  (1.753 m), weight 204 lb 4.1 oz (92.7 kg), SpO2 100 %.   Physical Exam  Constitutional: She is oriented to person, place, and time. She appears well-developed and well-nourished. No distress.  HENT:  Head: Normocephalic and atraumatic.  Mouth/Throat: Oropharynx is clear and moist. No oropharyngeal exudate.  Long brown hair.  Mask.  Eyes: Pupils are equal, round, and reactive to light. Conjunctivae and EOM are normal. No scleral icterus.  Brown eyes.  Neck: No JVD present.  Cardiovascular: Normal rate, regular rhythm, normal heart sounds and normal pulses.  No murmur heard. Pulmonary/Chest: Effort normal and breath sounds normal. No respiratory distress. She has no wheezes. She has no rhonchi. She has no rales. Right breast exhibits no inverted nipple, no mass, no nipple discharge, no skin change (upper quadrant fibrocystic changes) and no tenderness. Left  breast exhibits tenderness. Left breast exhibits no inverted nipple, no mass, no nipple discharge and no skin change (s/p lumpectomy; scarring; inner quadrant hyperpigmentation s/p radiation).  Abdominal: Soft. Bowel sounds are normal. She exhibits no distension and no mass. There is no abdominal tenderness. There is no rebound and no guarding.  Musculoskeletal:        General: Tenderness (slight bilateral axillary discomfort) present. No edema. Normal range of motion.     Cervical back: Normal range  of motion and neck supple.     Right lower leg: No edema.     Left lower leg: No edema.  Lymphadenopathy:    She has no cervical adenopathy.    She has no axillary adenopathy.       Right: No supraclavicular adenopathy present.       Left: No supraclavicular adenopathy present.  Neurological: She is alert and oriented to person, place, and time. She has normal reflexes.  Skin: Skin is warm and dry. No rash noted. She is not diaphoretic. No erythema. No pallor.  Psychiatric: She has a normal mood and affect. Her behavior is normal. Judgment and thought content normal.  Nursing note and vitals reviewed.   No visits with results within 3 Day(s) from this visit.  Latest known visit with results is:  Appointment on 08/08/2019  Component Date Value Ref Range Status  . Sodium 08/08/2019 137  135 - 145 mmol/L Final  . Potassium 08/08/2019 3.8  3.5 - 5.1 mmol/L Final  . Chloride 08/08/2019 103  98 - 111 mmol/L Final  . CO2 08/08/2019 23  22 - 32 mmol/L Final  . Glucose, Bld 08/08/2019 105* 70 - 99 mg/dL Final   Glucose reference range applies only to samples taken after fasting for at least 8 hours.  . BUN 08/08/2019 9  6 - 20 mg/dL Final  . Creatinine, Ser 08/08/2019 0.74  0.44 - 1.00 mg/dL Final  . Calcium 08/08/2019 8.9  8.9 - 10.3 mg/dL Final  . Total Protein 08/08/2019 7.6  6.5 - 8.1 g/dL Final  . Albumin 08/08/2019 4.1  3.5 - 5.0 g/dL Final  . AST 08/08/2019 19  15 - 41 U/L Final  . ALT  08/08/2019 20  0 - 44 U/L Final  . Alkaline Phosphatase 08/08/2019 94  38 - 126 U/L Final  . Total Bilirubin 08/08/2019 0.5  0.3 - 1.2 mg/dL Final  . GFR calc non Af Amer 08/08/2019 >60  >60 mL/min Final  . GFR calc Af Amer 08/08/2019 >60  >60 mL/min Final  . Anion gap 08/08/2019 11  5 - 15 Final   Performed at [facility], [address], [place], [place] [address]  . WBC 08/08/2019 6.5  4.0 - 10.5 K/uL Final  . RBC 08/08/2019 4.84  3.87 - 5.11 MIL/uL Final  . Hemoglobin 08/08/2019 13.2  12.0 - 15.0 g/dL Final  . HCT 08/08/2019 41.6  36.0 - 46.0 % Final  . MCV 08/08/2019 86.0  80.0 - 100.0 fL Final  . MCH 08/08/2019 27.3  26.0 - 34.0 pg Final  . MCHC 08/08/2019 31.7  30.0 - 36.0 g/dL Final  . RDW 08/08/2019 14.4  11.5 - 15.5 % Final  . Platelets 08/08/2019 209  150 - 400 K/uL Final  . nRBC 08/08/2019 0.0  0.0 - 0.2 % Final  . Neutrophils Relative % 08/08/2019 54  % Final  . Neutro Abs 08/08/2019 3.6  1.7 - 7.7 K/uL Final  . Lymphocytes Relative 08/08/2019 37  % Final  . Lymphs Abs 08/08/2019 2.4  0.7 - 4.0 K/uL Final  . Monocytes Relative 08/08/2019 6  % Final  . Monocytes Absolute 08/08/2019 0.4  0.1 - 1.0 K/uL Final  . Eosinophils Relative 08/08/2019 2  % Final  . Eosinophils Absolute 08/08/2019 0.1  0.0 - 0.5 K/uL Final  . Basophils Relative 08/08/2019 1  % Final  . Basophils Absolute 08/08/2019 0.1  0.0 - 0.1 K/uL Final  . Immature Granulocytes 08/08/2019 0  %  Final  . Abs Immature Granulocytes 08/08/2019 0.01  0.00 - 0.07 K/uL Final   Performed at [facility], [address] [place], [place] [address]  . CA 27.29 08/08/2019 19.2  0.0 - 38.6 U/mL Final   Comment: (NOTE) Siemens Centaur Immunochemiluminometric Methodology [facility]) Values obtained with different assay methods or kits cannot be used interchangeably. Results cannot be interpreted as absolute evidence of the presence or absence of malignant disease. Performed At: [facility] [place], [place] [id number] Rush Farmer MD IY:[id number]     Assessment:  Donna Riley is a 59 y.o. female with stage IIA left breast cancerstatus post partial mastectomy and sentinel lymph node biopsy on 08/27/2014. 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 There was lymphovascular invasion. One of 2 sentinel lymph nodes were positive (focus of 2.8 mm). Tumor was >90% ER positive, >90% PR positive, and Her2/neu negative. Pathologic stage was [id number].  Bone scanon 09/16/2014 revealed abnormal focal uptake at the level of right L3 pedicle and L5 vertebral body. Lumbar spine MRI on 09/27/2014 revealed no evidence of metastatic disease with lower lumbar facet arthritis.Abdominal and pelvic CT scanon 09/24/2014 revealed hepatomegaly and no evidence of metastatic disease.   She received 4 cycles of Taxotere and Cytoxan(09/29/2014 - 12/09/2014) with Neulasta support. She received 50.4 Gyto the left breast from 01/12/2015 until 02/23/2015. She was started on Femaraon 03/12/2015, but switched to [place] 03/30/2015 secondary to diffuse joint aches.   CA27.29has been followed: 16.9 on 09/09/2014, 12.1 on 06/02/2015, 15.6 on 08/07/2015, 17.4 on 11/23/2015, 13.6 on 02/22/2016, 18.9 on 06/20/2016, 18.4 on 10/21/2016, 15 on 02/23/2017, 14.1 on 06/26/2017, 15.6 on 12/25/2017, 12.4 on 08/08/2018, 8.8 on 02/08/2019, and 19.2 on 08/08/2019.  Bilateral diagnostic mammogramon 07/30/2015 revealed no evidence of malignancy. Left sided mammogram and ultrasound on 02/29/2016 revealed a 1.5 x 0.6 x 0.7 cm intraductal mass. Bilateral mammogramon 09/12/2016 revealed no focal fluid collection at the surgical site to account for the persistent drainage. There were no suspicious mammographic or targeted sonographic abnormalities at the lumpectomy site. There was no evidence of malignancy in the  bilateral breasts.   Bilateral mammogram on 09/14/2017 that revealed maturing fat necrosis at the LEFT lumpectomy site. There was no evidence of malignancy in either breast. Left diagnostic mammogram and ultrasound on 01/01/2018 revealed no significant abnormality other than a single normal appearing lymph node over the axillary tail portion of the left breast in the area of palpable abnormality and tenderness.  Bilateral diagnostic mammogram on 09/17/2018 revealed no evidence of malignancy in either breast. There were expected post-lumpectomy changes involving the left breast.   She had chronic left nipple discharge. Left breast lumpectomyat the 11 o'clock position on 04/05/2016 revealed an intraductal papillomawith sclerosis and calcifications. There was fat necrosis with fibrosis and calcifications. Pathology was negative for atypia and malignancy  Colonoscopyon 08/28/2015 revealed one 4 mm polyp in the rectum (tubular adenoma).  Bone densitystudy on 09/15/2014 revealed osteopeniawith a T-score of -2.1 at L1-L4. Bone densityon 01/17/2018 revealed osteoporosiswith a T-score of -2.6 in the AP spine L1-L4. She is on calcium and vitamin D.  Symptomatically, she has chronic intermittent breast pain.  Shoulder pain is better.  She has chronic low back pain.  Exam is stable.  Plan: 1.   Labs today: CBC with diff, CMP, CA 27.29. 2.   Stage IIALEFTbreast cancer Clinically, she is doing well Mammogram on 09/17/2018 revealed no  evidence of malignancy. Follow-up bilateral mammogram on 09/17/2019. Re-review consideration of BCI testing for consideration of extended adjuvant endocrine therapy.            Patient states that she is interested.  Form completed. Continue Arimidex. 3.   Osteoporosis             Continue calcium and vitamin D.             Patient had all teeth extracted.  Await dental clearance prior to initiation of Prolia. 4.   H pylori gastritis             Patient s/p  EGD on x/x/2021.  She was diagnosed with H pylori and completed a 2 week course of treatment.  Encourage patient to follow-up with GI regarding H pylori breath testing.  Anticipate follow-up EGD in 6 months. 5.   Hypertension  BP 172/94.  Blood pressure has been chronically elevated.  Follow-up with Dr Janene Harvey today. 6.   RTC in 6 months for MD assessment and labs (CBC with diff, CMP, CA27.29).   I discussed the assessment and treatment plan with the patient.  The patient was provided an opportunity to ask questions and all were answered.  The patient agreed with the plan and demonstrated an understanding of the instructions.  The patient was advised to call back if the symptoms worsen or if the condition fails to improve as anticipated.   Lequita Asal, MD, PhD    08/13/2019, 8:54 AM  I, Selena Batten, am acting as scribe for [employer]. Mike Gip, MD, PhD.  I, Dyana Magner C. Mike Gip, MD, have reviewed the above documentation for accuracy and completeness, and I agree with the above.

## 2019-08-12 ENCOUNTER — Other Ambulatory Visit: Payer: Self-pay

## 2019-08-12 NOTE — Progress Notes (Signed)
Confirmed Name and DOB. Denies any concerns.  

## 2019-08-13 ENCOUNTER — Encounter: Payer: Self-pay | Admitting: Internal Medicine

## 2019-08-13 ENCOUNTER — Encounter: Payer: Self-pay | Admitting: Hematology and Oncology

## 2019-08-13 ENCOUNTER — Inpatient Hospital Stay (HOSPITAL_BASED_OUTPATIENT_CLINIC_OR_DEPARTMENT_OTHER): Payer: Medicare Other | Admitting: Hematology and Oncology

## 2019-08-13 ENCOUNTER — Other Ambulatory Visit: Payer: Self-pay

## 2019-08-13 ENCOUNTER — Ambulatory Visit (INDEPENDENT_AMBULATORY_CARE_PROVIDER_SITE_OTHER): Payer: Medicare Other | Admitting: Internal Medicine

## 2019-08-13 VITALS — BP 172/94 | HR 66 | Temp 96.9°F | Ht 69.0 in | Wt 204.0 lb

## 2019-08-13 VITALS — BP 194/84 | HR 74 | Temp 96.6°F | Resp 18 | Ht 69.0 in | Wt 204.3 lb

## 2019-08-13 DIAGNOSIS — M810 Age-related osteoporosis without current pathological fracture: Secondary | ICD-10-CM | POA: Diagnosis not present

## 2019-08-13 DIAGNOSIS — C50912 Malignant neoplasm of unspecified site of left female breast: Secondary | ICD-10-CM | POA: Diagnosis not present

## 2019-08-13 DIAGNOSIS — I10 Essential (primary) hypertension: Secondary | ICD-10-CM | POA: Diagnosis not present

## 2019-08-13 DIAGNOSIS — Q400 Congenital hypertrophic pyloric stenosis: Secondary | ICD-10-CM

## 2019-08-13 DIAGNOSIS — Z170 Estrogen receptor positive status [ER+]: Secondary | ICD-10-CM

## 2019-08-13 MED ORDER — AMLODIPINE BESYLATE 10 MG PO TABS: 20.0000 mg | ORAL_TABLET | Freq: Every day | ORAL | 1 refills | Status: DC

## 2019-08-13 NOTE — Progress Notes (Signed)
Date:  08/13/2019   Name:  Donna Riley   DOB:  11-24-1960   MRN:  [id number]   Chief Complaint: Hypertension (Been high for 3-4 weeks. Thought it was mucinex but she has not taken this for a month now. Has headaches on and off. )  Hypertension This is a chronic problem. The problem has been rapidly worsening since onset. The problem is uncontrolled. Associated symptoms include headaches. Pertinent negatives include no chest pain, palpitations or shortness of breath.  BP was suddenly very high about a week ago.  She had taken a Mucinex but has not taken any since.  She feels well except for a mild headache.  No use of any supplements, diet, cold or sinus medication.  She has been compliant with her current regimen.  Lab Results  Component Value Date   CREATININE 0.74 08/08/2019   BUN 9 08/08/2019   NA 137 08/08/2019   K 3.8 08/08/2019   CL 103 08/08/2019   CO2 23 08/08/2019   Lab Results  Component Value Date   CHOL 177 10/09/2018   HDL 37 (L) 10/09/2018   LDLCALC 70 10/09/2018   TRIG 349 (H) 10/09/2018   CHOLHDL 4.8 10/09/2018   Lab Results  Component Value Date   TSH 1.765 04/11/2018   Lab Results  Component Value Date   HGBA1C 6.3 (H) 06/03/2019   Lab Results  Component Value Date   WBC 6.5 08/08/2019   HGB 13.2 08/08/2019   HCT 41.6 08/08/2019   MCV 86.0 08/08/2019   PLT 209 08/08/2019   Lab Results  Component Value Date   ALT 20 08/08/2019   AST 19 08/08/2019   ALKPHOS 94 08/08/2019   BILITOT 0.5 08/08/2019     Review of Systems  Constitutional: Negative for appetite change, fatigue and unexpected weight change.  Respiratory: Negative for chest tightness and shortness of breath.   Cardiovascular: Negative for chest pain, palpitations and leg swelling.  Neurological: Positive for headaches. Negative for dizziness, weakness, light-headedness and numbness.    Patient Active Problem List   Diagnosis Date Noted  . Gastritis determined by endoscopy  06/03/2019  . Abdominal bloating   . Nausea   . Secondary and unspecified malignant neoplasm of axilla and upper limb lymph nodes ([place]) 02/18/2019  . Vertigo 10/09/2018  . Acute midline low back pain without sciatica 10/09/2018  . Hyperlipidemia associated with type 2 diabetes mellitus ([place]) 04/11/2018  . Gastroparesis 04/11/2018  . Long term (current) use of aromatase inhibitors 12/25/2017  . Arthritis   . Elevated LFTs   . Positive PPD, treated   . Umbilical hernia without obstruction and without gangrene 07/13/2016  . Rectal polyp   . Irritable bowel syndrome with both constipation and diarrhea 07/20/2015  . Type II diabetes mellitus with complication ([place]) [id number]  . Pre-ulcerative calluses 06/22/2015  . Neuropathy 06/22/2015  . Hot flash, menopausal 04/09/2015  . Essential (primary) hypertension 03/25/2015  . Osteoporosis of lumbar spine 03/12/2015  . Malignant neoplasm of left breast in female, estrogen receptor positive ([place]) 05/30/2014  . 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 BREAST BIOPSY Left 07/31/2014   positive. Lumpectomy 08/27/2014 with chemo and rad  . BREAST BIOPSY Left 03/08/2016   INTRADUCTAL PAPILLOMA WITH SCLEROSIS AND CALCIFICATIONS  . BREAST BIOPSY Left 03/08/2016   FAT NECROSIS WITH FIBROSIS  . 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  BREAST LUMPECTOMY WITH NEEDLE LOCALIZATION Left 04/05/2016   Procedure: BREAST LUMPECTOMY WITH NEEDLE LOCALIZATION;  Surgeon: Hubbard Robinson, MD;  Location: [facility];  Service: General;  Laterality: Left;  . BREAST LUMPECTOMY WITH NEEDLE LOCALIZATION AND AXILLARY SENTINEL LYMPH NODE BX Left 08/27/14  . CHOLECYSTECTOMY  09/07/2013  . COLONOSCOPY WITH PROPOFOL N/A 08/28/2015   Procedure: COLONOSCOPY WITH PROPOFOL;  Surgeon: Lucilla Lame, MD;  Location: [facility];  Service: Endoscopy;  Laterality: N/A;  Diabetic  oral  . ESOPHAGOGASTRODUODENOSCOPY  2014   gastritis  . ESOPHAGOGASTRODUODENOSCOPY (EGD) WITH PROPOFOL N/A 04/16/2019   Procedure: ESOPHAGOGASTRODUODENOSCOPY (EGD) WITH PROPOFOL;  Surgeon: Virgel Manifold, MD;  Location: [facility];  Service: Endoscopy;  Laterality: N/A;  . POLYPECTOMY  08/28/2015   Procedure: POLYPECTOMY INTESTINAL;  Surgeon: Lucilla Lame, MD;  Location: [place];  Service: Endoscopy;;  Rectal polyp  . TUBAL LIGATION Bilateral   . UMBILICAL HERNIA REPAIR N/A 08/09/2016   Procedure: HERNIA REPAIR UMBILICAL ADULT;  Surgeon: Olean Ree, MD;  Location: [facility];  Service: General;  Laterality: N/A;  . VENTRAL HERNIA REPAIR N/A 04/10/2015   Procedure: HERNIA REPAIR VENTRAL ADULT;  Surgeon: Leonie Green, MD;  Location: [facility];  Service: General;  Laterality: N/A;    Social History   Tobacco Use  . Smoking status: Current Every Day Smoker    Packs/day: 0.50    Years: 38.00    Pack years: 19.00    Types: Cigarettes  . Smokeless tobacco: Never Used  Substance Use Topics  . Alcohol use: Yes    Alcohol/week: 2.0 standard drinks    Types: 2 Cans of beer per week    Comment: Occasionally   . Drug use: No     Medication list has been reviewed and updated.  Current Meds  Medication Sig  . acetaminophen (TYLENOL) 325 MG tablet Take 650 mg by mouth daily as needed.  . Alcohol Swabs (B-D SINGLE USE SWABS REGULAR) PADS 1 each by Does not apply route 2 (two) times daily.  Marland Kitchen amLODipine (NORVASC) 10 MG tablet Take 1 tablet (10 mg total) by mouth daily.  Marland Kitchen anastrozole (ARIMIDEX) 1 MG tablet Take 1 tablet (1 mg total) by mouth daily.  Marland Kitchen aspirin EC 81 MG tablet Take 81 mg by mouth daily.  Marland Kitchen atorvastatin (LIPITOR) 10 MG tablet Take 1 tablet (10 mg total) by mouth daily. (Patient taking differently: Take 10 mg by mouth once a week. )  . Blood Glucose Calibration (TRUE METRIX LEVEL 1) Low SOLN 1 each by In Vitro route daily as needed.  . Blood Glucose  Monitoring Suppl (TRUE METRIX METER) DEVI 1 each by Does not apply route daily.  . hydrochlorothiazide (HYDRODIURIL) 25 MG tablet Take 25 mg by mouth daily.   . irbesartan (AVAPRO) 300 MG tablet TAKE 1 TABLET BY MOUTH  DAILY  . metFORMIN (GLUCOPHAGE-XR) 500 MG 24 hr tablet Take 1 tablet (500 mg total) by mouth daily with breakfast.  . naproxen sodium (ALEVE) 220 MG tablet Take 220 mg by mouth daily as needed.     PHQ 2/9 Scores 08/13/2019 07/10/2019 07/10/2019 06/03/2019  PHQ - 2 Score 0 1 0 0  PHQ- 9 Score 4 4 - -    BP Readings from Last 3 Encounters:  08/13/19 (!) 172/94  08/13/19 (!) 194/84  08/07/19 (!) 213/136    Physical Exam Vitals and nursing note reviewed.  Constitutional:      General: She is not in acute distress.  Appearance: Normal appearance. She is well-developed.  HENT:     Head: Normocephalic and atraumatic.  Neck:     Vascular: No carotid bruit.  Cardiovascular:     Rate and Rhythm: Normal rate and regular rhythm.     Pulses: Normal pulses.     Heart sounds: No murmur.  Pulmonary:     Effort: Pulmonary effort is normal. No respiratory distress.  Musculoskeletal:     Right lower leg: No edema.     Left lower leg: No edema.  Lymphadenopathy:     Cervical: No cervical adenopathy.  Skin:    General: Skin is warm and dry.     Capillary Refill: Capillary refill takes less than 2 seconds.     Findings: No rash.  Neurological:     General: No focal deficit present.     Mental Status: She is alert and oriented to person, place, and time.  Psychiatric:        Behavior: Behavior normal.        Thought Content: Thought content normal.     Wt Readings from Last 3 Encounters:  08/13/19 204 lb (92.5 kg)  08/13/19 204 lb 4.1 oz (92.7 kg)  08/07/19 202 lb 6.4 oz (91.8 kg)    BP (!) 172/94   Pulse 66   Temp (!) 96.9 F (36.1 C) (Temporal)   Ht 5\' 9"  (1.753 m)   Wt 204 lb (92.5 kg)   SpO2 98%   BMI 30.13 kg/m   Assessment and Plan: 1. Essential  (primary) hypertension BP uncontrolled on amlodipine, avapro and hctz Will double amlodipine to 20 mg per day Recheck in 2 weeks Labs done today at [facility] are reassuring - amLODipine (NORVASC) 10 MG tablet; Take 2 tablets (20 mg total) by mouth daily.  Dispense: 180 tablet; Refill: 1   Partially dictated using Editor, commissioning. Any errors are unintentional.  Halina Maidens, MD [facility]  08/13/2019

## 2019-08-13 NOTE — Patient Instructions (Signed)
Covid Vaccine locations: [employer] [facility]. [place]  [email or web address]

## 2019-08-14 LAB — H. PYLORI BREATH TEST: H pylori Breath Test: NEGATIVE

## 2019-08-15 ENCOUNTER — Other Ambulatory Visit: Payer: Self-pay

## 2019-08-15 ENCOUNTER — Telehealth: Payer: Self-pay

## 2019-08-15 DIAGNOSIS — K31A0 Gastric intestinal metaplasia, unspecified: Secondary | ICD-10-CM

## 2019-08-15 DIAGNOSIS — K3189 Other diseases of stomach and duodenum: Secondary | ICD-10-CM

## 2019-08-15 NOTE — Telephone Encounter (Signed)
Faxed clinical information to [employer] due to Discrepancy/ Fax conformation confirmed.

## 2019-08-15 NOTE — Telephone Encounter (Signed)
Patient verbalized understanding. Got patient scheduled for EGD on 10/03/2019. Mailed instructions to patient

## 2019-08-15 NOTE — Telephone Encounter (Signed)
-----   Message from Virgel Manifold, MD sent at 08/15/2019 10:40 AM EDT ----- Caryl Pina please let the patient know, breath test was negative for infection. 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 This is a change in the tissue that can go away after H Pylori treatment, but repeat biopsies are needed to evaluate it further. Schedule EGD with me or set recall if she is not ready to schedule yet

## 2019-08-21 ENCOUNTER — Ambulatory Visit: Payer: Self-pay | Admitting: Surgery

## 2019-08-27 ENCOUNTER — Encounter: Payer: Self-pay | Admitting: Internal Medicine

## 2019-08-27 ENCOUNTER — Other Ambulatory Visit: Payer: Self-pay

## 2019-08-27 ENCOUNTER — Ambulatory Visit (INDEPENDENT_AMBULATORY_CARE_PROVIDER_SITE_OTHER): Payer: Medicare Other | Admitting: Internal Medicine

## 2019-08-27 VITALS — BP 152/70 | HR 96 | Temp 96.9°F | Ht 69.0 in | Wt 201.0 lb

## 2019-08-27 DIAGNOSIS — I10 Essential (primary) hypertension: Secondary | ICD-10-CM

## 2019-08-27 DIAGNOSIS — I999 Unspecified disorder of circulatory system: Secondary | ICD-10-CM

## 2019-08-27 NOTE — Progress Notes (Signed)
Date:  08/27/2019   Name:  Donna Riley   DOB:  1960/09/08   MRN:  [id number]   Chief Complaint: Hypertension (2 week follow up. Patient brought her own cuff. )  Hypertension This is a chronic problem. The problem has been gradually improving since onset. The problem is resistant. Pertinent negatives include no chest pain, headaches or shortness of breath. Past treatments include calcium channel blockers, angiotensin blockers and diuretics (she is not taking HCTZ every day because of increased urination). The current treatment provides moderate improvement. Compliance problems: not taking hctz daily.   BP with her cuff - 164/80. Our cuff  152/70  Lab Results  Component Value Date   CREATININE 0.74 08/08/2019   BUN 9 08/08/2019   NA 137 08/08/2019   K 3.8 08/08/2019   CL 103 08/08/2019   CO2 23 08/08/2019   Lab Results  Component Value Date   CHOL 177 10/09/2018   HDL 37 (L) 10/09/2018   LDLCALC 70 10/09/2018   TRIG 349 (H) 10/09/2018   CHOLHDL 4.8 10/09/2018   Lab Results  Component Value Date   TSH 1.765 04/11/2018   Lab Results  Component Value Date   HGBA1C 6.3 (H) 06/03/2019   Lab Results  Component Value Date   WBC 6.5 08/08/2019   HGB 13.2 08/08/2019   HCT 41.6 08/08/2019   MCV 86.0 08/08/2019   PLT 209 08/08/2019   Lab Results  Component Value Date   ALT 20 08/08/2019   AST 19 08/08/2019   ALKPHOS 94 08/08/2019   BILITOT 0.5 08/08/2019     Review of Systems  Constitutional: Negative for chills, fatigue and fever.  Respiratory: Negative for chest tightness and shortness of breath.   Cardiovascular: Negative for chest pain and leg swelling.  Gastrointestinal: Negative for constipation.  Skin:       Lesion on left thumb that bleeds profusely with trauma  Neurological: Negative for dizziness and headaches.    Patient Active Problem List   Diagnosis Date Noted  . Gastritis determined by endoscopy 06/03/2019  . Abdominal bloating   . Nausea     . Secondary and unspecified malignant neoplasm of axilla and upper limb lymph nodes ([place]) 02/18/2019  . Vertigo 10/09/2018  . Acute midline low back pain without sciatica 10/09/2018  . Hyperlipidemia associated with type 2 diabetes mellitus ([place]) 04/11/2018  . Gastroparesis 04/11/2018  . Long term (current) use of aromatase inhibitors 12/25/2017  . Arthritis   . Elevated LFTs   . Positive PPD, treated   . Umbilical hernia without obstruction and without gangrene 07/13/2016  . Rectal polyp   . Irritable bowel syndrome with both constipation and diarrhea 07/20/2015  . Type II diabetes mellitus with complication ([place]) Q000111Q  . Pre-ulcerative calluses 06/22/2015  . Neuropathy 06/22/2015  . Hot flash, menopausal 04/09/2015  . Essential (primary) hypertension 03/25/2015  . Osteoporosis of lumbar spine 03/12/2015  . Malignant neoplasm of left breast in female, estrogen receptor positive ([place]) 05/30/2014  . 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 BREAST BIOPSY Left 07/31/2014   positive. Lumpectomy 08/27/2014 with chemo and rad  . BREAST BIOPSY Left 03/08/2016   INTRADUCTAL PAPILLOMA WITH SCLEROSIS AND CALCIFICATIONS  . BREAST BIOPSY Left 03/08/2016   FAT NECROSIS WITH FIBROSIS  . 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 BREAST LUMPECTOMY WITH NEEDLE LOCALIZATION Left 04/05/2016   Procedure: BREAST  LUMPECTOMY WITH NEEDLE LOCALIZATION;  Surgeon: Hubbard Robinson, MD;  Location: [facility];  Service: General;  Laterality: Left;  . BREAST LUMPECTOMY WITH NEEDLE LOCALIZATION AND AXILLARY SENTINEL LYMPH NODE BX Left 08/27/14  . CHOLECYSTECTOMY  09/07/2013  . COLONOSCOPY WITH PROPOFOL N/A 08/28/2015   Procedure: COLONOSCOPY WITH PROPOFOL;  Surgeon: Lucilla Lame, MD;  Location: [place];  Service: Endoscopy;  Laterality: N/A;  Diabetic oral  . ESOPHAGOGASTRODUODENOSCOPY  2014    gastritis  . ESOPHAGOGASTRODUODENOSCOPY (EGD) WITH PROPOFOL N/A 04/16/2019   Procedure: ESOPHAGOGASTRODUODENOSCOPY (EGD) WITH PROPOFOL;  Surgeon: Virgel Manifold, MD;  Location: [facility];  Service: Endoscopy;  Laterality: N/A;  . POLYPECTOMY  08/28/2015   Procedure: POLYPECTOMY INTESTINAL;  Surgeon: Lucilla Lame, MD;  Location: [place];  Service: Endoscopy;;  Rectal polyp  . TUBAL LIGATION Bilateral   . UMBILICAL HERNIA REPAIR N/A 08/09/2016   Procedure: HERNIA REPAIR UMBILICAL ADULT;  Surgeon: Olean Ree, MD;  Location: [facility];  Service: General;  Laterality: N/A;  . VENTRAL HERNIA REPAIR N/A 04/10/2015   Procedure: HERNIA REPAIR VENTRAL ADULT;  Surgeon: Leonie Green, MD;  Location: [facility];  Service: General;  Laterality: N/A;    Social History   Tobacco Use  . Smoking status: Current Every Day Smoker    Packs/day: 0.50    Years: 38.00    Pack years: 19.00    Types: Cigarettes  . Smokeless tobacco: Never Used  Substance Use Topics  . Alcohol use: Yes    Alcohol/week: 2.0 standard drinks    Types: 2 Cans of beer per week    Comment: Occasionally   . Drug use: No     Medication list has been reviewed and updated.  Current Meds  Medication Sig  . acetaminophen (TYLENOL) 325 MG tablet Take 650 mg by mouth daily as needed.  . Alcohol Swabs (B-D SINGLE USE SWABS REGULAR) PADS 1 each by Does not apply route 2 (two) times daily.  Marland Kitchen amLODipine (NORVASC) 10 MG tablet Take 2 tablets (20 mg total) by mouth daily.  Marland Kitchen anastrozole (ARIMIDEX) 1 MG tablet Take 1 tablet (1 mg total) by mouth daily.  Marland Kitchen aspirin EC 81 MG tablet Take 81 mg by mouth daily.  Marland Kitchen atorvastatin (LIPITOR) 10 MG tablet Take 1 tablet (10 mg total) by mouth daily. (Patient taking differently: Take 10 mg by mouth once a week. )  . Blood Glucose Calibration (TRUE METRIX LEVEL 1) Low SOLN 1 each by In Vitro route daily as needed.  . Blood Glucose Monitoring Suppl (TRUE METRIX METER) DEVI 1 each  by Does not apply route daily.  . hydrochlorothiazide (HYDRODIURIL) 25 MG tablet Take 25 mg by mouth daily.   . irbesartan (AVAPRO) 300 MG tablet TAKE 1 TABLET BY MOUTH  DAILY  . metFORMIN (GLUCOPHAGE-XR) 500 MG 24 hr tablet Take 1 tablet (500 mg total) by mouth daily with breakfast.  . naproxen sodium (ALEVE) 220 MG tablet Take 220 mg by mouth daily as needed.     PHQ 2/9 Scores 08/27/2019 08/13/2019 07/10/2019 07/10/2019  PHQ - 2 Score 0 0 1 0  PHQ- 9 Score 6 4 4  -    BP Readings from Last 3 Encounters:  08/27/19 (!) 152/70  08/13/19 (!) 172/94  08/13/19 (!) 194/84    Physical Exam Vitals and nursing note reviewed.  Constitutional:      General: She is not in acute distress.    Appearance: She is well-developed.  HENT:     Head:  Normocephalic and atraumatic.  Cardiovascular:     Rate and Rhythm: Normal rate and regular rhythm.     Heart sounds: No murmur.  Pulmonary:     Effort: Pulmonary effort is normal. No respiratory distress.     Breath sounds: No wheezing or rhonchi.  Musculoskeletal:     Right lower leg: No edema.     Left lower leg: No edema.  Skin:    General: Skin is warm and dry.     Findings: No rash.     Comments: Vascular appearing raised lesion on distal left thumb  Neurological:     Mental Status: She is alert and oriented to person, place, and time.  Psychiatric:        Behavior: Behavior normal.        Thought Content: Thought content normal.     Wt Readings from Last 3 Encounters:  08/27/19 201 lb (91.2 kg)  08/13/19 204 lb (92.5 kg)  08/13/19 204 lb 4.1 oz (92.7 kg)    BP (!) 152/70   Pulse 96   Temp (!) 96.9 F (36.1 C) (Temporal)   Ht 5\' 9"  (1.753 m)   Wt 201 lb (91.2 kg)   SpO2 98%   BMI 29.68 kg/m   Assessment and Plan: 1. Essential (primary) hypertension Improved with increase in Amlodipine to 20 mg Pt is not taking hctz daily - encouraged daily use along with other medication Evaluate at next appt in May.  2. Vascular  lesion Unsure what specialty would address this Rec protecting the thumb from trauma and can refer if needed   Partially dictated using Dragon software. Any errors are unintentional.  Halina Maidens, MD [facility]  08/27/2019

## 2019-09-02 ENCOUNTER — Encounter: Payer: Self-pay | Admitting: Hematology and Oncology

## 2019-09-19 ENCOUNTER — Ambulatory Visit
Admission: RE | Admit: 2019-09-19 | Discharge: 2019-09-19 | Disposition: A | Discharge status: Home / Self Care | Payer: Medicare Other | Source: Ambulatory Visit | Attending: Hematology and Oncology | Admitting: Hematology and Oncology

## 2019-09-19 DIAGNOSIS — Z853 Personal history of malignant neoplasm of breast: Secondary | ICD-10-CM | POA: Insufficient documentation

## 2019-09-19 DIAGNOSIS — Z170 Estrogen receptor positive status [ER+]: Secondary | ICD-10-CM

## 2019-09-19 DIAGNOSIS — C50912 Malignant neoplasm of unspecified site of left female breast: Secondary | ICD-10-CM

## 2019-10-01 ENCOUNTER — Other Ambulatory Visit: Payer: Self-pay

## 2019-10-01 ENCOUNTER — Encounter: Payer: Self-pay | Admitting: Internal Medicine

## 2019-10-01 ENCOUNTER — Other Ambulatory Visit
Admission: RE | Admit: 2019-10-01 | Discharge: 2019-10-01 | Disposition: A | Discharge status: Home / Self Care | Payer: Medicare Other | Source: Home / Self Care | Attending: Internal Medicine | Admitting: Internal Medicine

## 2019-10-01 ENCOUNTER — Other Ambulatory Visit
Admission: RE | Admit: 2019-10-01 | Discharge: 2019-10-01 | Disposition: A | Discharge status: Home / Self Care | Payer: Medicare Other | Source: Ambulatory Visit | Attending: Gastroenterology | Admitting: Gastroenterology

## 2019-10-01 ENCOUNTER — Ambulatory Visit (INDEPENDENT_AMBULATORY_CARE_PROVIDER_SITE_OTHER): Payer: Medicare Other | Admitting: Internal Medicine

## 2019-10-01 VITALS — BP 118/70 | HR 84 | Temp 96.2°F | Ht 69.0 in | Wt 199.0 lb

## 2019-10-01 DIAGNOSIS — Z20822 Contact with and (suspected) exposure to covid-19: Secondary | ICD-10-CM | POA: Diagnosis not present

## 2019-10-01 DIAGNOSIS — K3189 Other diseases of stomach and duodenum: Secondary | ICD-10-CM | POA: Insufficient documentation

## 2019-10-01 DIAGNOSIS — Z7982 Long term (current) use of aspirin: Secondary | ICD-10-CM | POA: Insufficient documentation

## 2019-10-01 DIAGNOSIS — E118 Type 2 diabetes mellitus with unspecified complications: Secondary | ICD-10-CM

## 2019-10-01 DIAGNOSIS — F17210 Nicotine dependence, cigarettes, uncomplicated: Secondary | ICD-10-CM | POA: Diagnosis not present

## 2019-10-01 DIAGNOSIS — Z7984 Long term (current) use of oral hypoglycemic drugs: Secondary | ICD-10-CM | POA: Diagnosis not present

## 2019-10-01 DIAGNOSIS — E785 Hyperlipidemia, unspecified: Secondary | ICD-10-CM | POA: Diagnosis not present

## 2019-10-01 DIAGNOSIS — R252 Cramp and spasm: Secondary | ICD-10-CM | POA: Diagnosis not present

## 2019-10-01 DIAGNOSIS — Z01812 Encounter for preprocedural laboratory examination: Secondary | ICD-10-CM | POA: Diagnosis present

## 2019-10-01 DIAGNOSIS — Z9221 Personal history of antineoplastic chemotherapy: Secondary | ICD-10-CM | POA: Diagnosis not present

## 2019-10-01 DIAGNOSIS — I10 Essential (primary) hypertension: Secondary | ICD-10-CM | POA: Diagnosis not present

## 2019-10-01 DIAGNOSIS — Z923 Personal history of irradiation: Secondary | ICD-10-CM | POA: Insufficient documentation

## 2019-10-01 DIAGNOSIS — Z853 Personal history of malignant neoplasm of breast: Secondary | ICD-10-CM | POA: Insufficient documentation

## 2019-10-01 DIAGNOSIS — Z79899 Other long term (current) drug therapy: Secondary | ICD-10-CM | POA: Insufficient documentation

## 2019-10-01 DIAGNOSIS — E119 Type 2 diabetes mellitus without complications: Secondary | ICD-10-CM | POA: Diagnosis not present

## 2019-10-01 DIAGNOSIS — C50912 Malignant neoplasm of unspecified site of left female breast: Secondary | ICD-10-CM

## 2019-10-01 LAB — BASIC METABOLIC PANEL
Anion gap: 10 (ref 5–15)
BUN: 24 mg/dL — ABNORMAL HIGH (ref 6–20)
CO2: 25 mmol/L (ref 22–32)
Calcium: 9.5 mg/dL (ref 8.9–10.3)
Chloride: 99 mmol/L (ref 98–111)
Creatinine, Ser: 1.02 mg/dL — ABNORMAL HIGH (ref 0.44–1.00)
GFR calc Af Amer: >60 mL/min (ref >60)
GFR calc non Af Amer: >60 mL/min (ref >60)
Glucose, Bld: 118 mg/dL — ABNORMAL HIGH (ref 70–99)
Potassium: 4.1 mmol/L (ref 3.5–5.1)
Sodium: 134 mmol/L — ABNORMAL LOW (ref 135–145)

## 2019-10-01 LAB — HEMOGLOBIN A1C
Hgb A1c MFr Bld: 6.3 % — ABNORMAL HIGH (ref 4.8–5.6)
Mean Plasma Glucose: 134.11 mg/dL

## 2019-10-01 LAB — SARS CORONAVIRUS 2 (TAT 6-24 HRS): SARS Coronavirus 2: NEGATIVE

## 2019-10-01 LAB — MAGNESIUM: Magnesium: 2.2 mg/dL (ref 1.7–2.4)

## 2019-10-01 MED ORDER — METFORMIN HCL ER 500 MG PO TB24: 500.0000 mg | ORAL_TABLET | Freq: Every day | ORAL | 3 refills | Status: DC

## 2019-10-01 MED ORDER — ANASTROZOLE 1 MG PO TABS: 1.0000 mg | ORAL_TABLET | Freq: Every day | ORAL | 0 refills | Status: DC

## 2019-10-01 MED ORDER — AMLODIPINE BESYLATE 10 MG PO TABS: 20.0000 mg | ORAL_TABLET | Freq: Every day | ORAL | 5 refills | Status: DC

## 2019-10-01 NOTE — Progress Notes (Signed)
Date:  10/01/2019   Name:  Donna Riley   DOB:  [birth year]   MRN:  [id number]   Chief Complaint: Hypertension (4 mnth follow up.), Diabetes, and Leg Pain (Both legs have been cramping for 2 weeks. Left foot was swollen over the weekend but now has gone back to normal. )  Hypertension This is a chronic problem. The problem is controlled. Pertinent negatives include no chest pain, headaches, palpitations or shortness of breath. Past treatments include calcium channel blockers, diuretics and angiotensin blockers (increased amlodipine to 20 mg last visit). The current treatment provides significant improvement. There are no compliance problems.   Diabetes She presents for her follow-up diabetic visit. She has type 2 diabetes mellitus. Her disease course has been stable. Pertinent negatives for hypoglycemia include no headaches or tremors. Pertinent negatives for diabetes include no chest pain, no fatigue, no polydipsia and no polyuria. Current diabetic treatment includes oral agent (monotherapy). She is compliant with treatment all of the time.  Leg Pain  The incident occurred more than 1 week ago. There was no injury mechanism. The pain is present in the left leg and right leg. The quality of the pain is described as cramping. The pain is mild. The pain has been fluctuating since onset. Pertinent negatives include no numbness. Treatments tried: mustard. The treatment provided mild relief.    Lab Results  Component Value Date   CREATININE 0.74 08/08/2019   BUN 9 08/08/2019   NA 137 08/08/2019   K 3.8 08/08/2019   CL 103 08/08/2019   CO2 23 08/08/2019   Lab Results  Component Value Date   CHOL 177 10/09/2018   HDL 37 (L) 10/09/2018   LDLCALC 70 10/09/2018   TRIG 349 (H) 10/09/2018   CHOLHDL 4.8 10/09/2018   Lab Results  Component Value Date   TSH 1.765 04/11/2018   Lab Results  Component Value Date   HGBA1C 6.3 (H) 06/03/2019   Lab Results  Component Value Date   WBC 6.5  08/08/2019   HGB 13.2 08/08/2019   HCT 41.6 08/08/2019   MCV 86.0 08/08/2019   PLT 209 08/08/2019   Lab Results  Component Value Date   ALT 20 08/08/2019   AST 19 08/08/2019   ALKPHOS 94 08/08/2019   BILITOT 0.5 08/08/2019     Review of Systems  Constitutional: Negative for appetite change, fatigue, fever and unexpected weight change (has lost a few pounds without effort).  HENT: Negative for tinnitus and trouble swallowing.   Eyes: Negative for visual disturbance.  Respiratory: Negative for cough, chest tightness and shortness of breath.   Cardiovascular: Negative for chest pain, palpitations and leg swelling.  Gastrointestinal: Negative for abdominal pain.  Endocrine: Negative for polydipsia and polyuria.  Genitourinary: Negative for dysuria and hematuria.  Musculoskeletal: Positive for myalgias (muscle cramping in legs). Negative for arthralgias.  Neurological: Negative for tremors, numbness and headaches.  Psychiatric/Behavioral: Negative for dysphoric mood.    Patient Active Problem List   Diagnosis Date Noted  . Gastritis determined by endoscopy 06/03/2019  . Abdominal bloating   . Nausea   . 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 Vertigo 10/09/2018  . Acute midline low back pain without sciatica 10/09/2018  . Hyperlipidemia associated with type 2 diabetes mellitus ([place]) 04/11/2018  . Gastroparesis 04/11/2018  . Long term (current) use of aromatase inhibitors 12/25/2017  . Arthritis   . Elevated LFTs   . Positive PPD,  treated   . Umbilical hernia without obstruction and without gangrene 07/13/2016  . Rectal polyp   . Irritable bowel syndrome with both constipation and diarrhea 07/20/2015  . Type II diabetes mellitus with complication ([place]) Q000111Q  . Pre-ulcerative calluses 06/22/2015  . Neuropathy 06/22/2015  . Hot flash, menopausal 04/09/2015  . Essential (primary) hypertension 03/25/2015  .  Osteoporosis of lumbar spine 03/12/2015  . Malignant neoplasm of left breast in female, estrogen receptor positive ([place]) 05/30/2014  . 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 BREAST BIOPSY Left 07/31/2014   [facility] and DCIS   . BREAST BIOPSY Left 03/08/2016   INTRADUCTAL PAPILLOMA   . 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 BREAST LUMPECTOMY Left 04/05/2016   excision of intraductal papilloma, no malignancy or atypia  . BREAST LUMPECTOMY WITH NEEDLE LOCALIZATION Left 04/05/2016   Procedure: BREAST LUMPECTOMY WITH NEEDLE LOCALIZATION;  Surgeon: Hubbard Robinson, MD;  Location: [facility] ORS;  Service: General;  Laterality: Left;  . BREAST LUMPECTOMY WITH NEEDLE LOCALIZATION AND AXILLARY SENTINEL LYMPH NODE BX Left 08/27/14  . CHOLECYSTECTOMY  09/07/2013  . COLONOSCOPY WITH PROPOFOL N/A 08/28/2015   Procedure: COLONOSCOPY WITH PROPOFOL;  Surgeon: Lucilla Lame, MD;  Location: [place];  Service: Endoscopy;  Laterality: N/A;  Diabetic oral  . ESOPHAGOGASTRODUODENOSCOPY  2014   gastritis  . ESOPHAGOGASTRODUODENOSCOPY (EGD) WITH PROPOFOL N/A 04/16/2019   Procedure: ESOPHAGOGASTRODUODENOSCOPY (EGD) WITH PROPOFOL;  Surgeon: Virgel Manifold, MD;  Location: [facility];  Service: Endoscopy;  Laterality: N/A;  . POLYPECTOMY  08/28/2015   Procedure: POLYPECTOMY INTESTINAL;  Surgeon: Lucilla Lame, MD;  Location: [place];  Service: Endoscopy;;  Rectal polyp  . TUBAL LIGATION Bilateral   . UMBILICAL HERNIA REPAIR N/A 08/09/2016   Procedure: HERNIA REPAIR UMBILICAL ADULT;  Surgeon: Olean Ree, MD;  Location: [facility];  Service: General;  Laterality: N/A;  . VENTRAL HERNIA REPAIR N/A 04/10/2015   Procedure: HERNIA REPAIR VENTRAL ADULT;  Surgeon: Leonie Green, MD;  Location: [facility];  Service: General;  Laterality: N/A;    Social History   Tobacco Use  . Smoking  status: Current Every Day Smoker    Packs/day: 0.50    Years: 38.00    Pack years: 19.00    Types: Cigarettes  . Smokeless tobacco: Never Used  Substance Use Topics  . Alcohol use: Yes    Alcohol/week: 2.0 standard drinks    Types: 2 Cans of beer per week    Comment: Occasionally   . Drug use: No     Medication list has been reviewed and updated.  Current Meds  Medication Sig  . acetaminophen (TYLENOL) 325 MG tablet Take 650 mg by mouth daily as needed.  . Alcohol Swabs (B-D SINGLE USE SWABS REGULAR) PADS 1 each by Does not apply route 2 (two) times daily.  Marland Kitchen amLODipine (NORVASC) 10 MG tablet Take 2 tablets (20 mg total) by mouth daily.  Marland Kitchen anastrozole (ARIMIDEX) 1 MG tablet Take 1 tablet (1 mg total) by mouth daily.  Marland Kitchen aspirin EC 81 MG tablet Take 81 mg by mouth daily.  Marland Kitchen atorvastatin (LIPITOR) 10 MG tablet Take 1 tablet (10 mg total) by mouth daily. (Patient taking differently: Take 10 mg by mouth once a week. )  . Blood Glucose Calibration (TRUE METRIX LEVEL 1) Low SOLN 1 each by In Vitro route daily as needed.  Marland Kitchen  Blood Glucose Monitoring Suppl (TRUE METRIX METER) DEVI 1 each by Does not apply route daily.  . hydrochlorothiazide (HYDRODIURIL) 25 MG tablet Take 25 mg by mouth daily.   . irbesartan (AVAPRO) 300 MG tablet TAKE 1 TABLET BY MOUTH  DAILY  . metFORMIN (GLUCOPHAGE-XR) 500 MG 24 hr tablet Take 1 tablet (500 mg total) by mouth daily with breakfast.  . naproxen sodium (ALEVE) 220 MG tablet Take 220 mg by mouth daily as needed.     PHQ 2/9 Scores 10/01/2019 08/27/2019 08/13/2019 07/10/2019  PHQ - 2 Score 0 0 0 1  PHQ- 9 Score 0 6 4 4     BP Readings from Last 3 Encounters:  10/01/19 118/70  08/27/19 (!) 152/70  08/13/19 (!) 172/94    Physical Exam Vitals and nursing note reviewed.  Constitutional:      General: She is not in acute distress.    Appearance: Normal appearance. She is well-developed.  HENT:     Head: Normocephalic and atraumatic.  Cardiovascular:      Rate and Rhythm: Normal rate and regular rhythm.     Pulses: Normal pulses.  Pulmonary:     Effort: Pulmonary effort is normal. No respiratory distress.     Breath sounds: No wheezing or rhonchi.  Musculoskeletal:        General: Tenderness (mild muscle tenderness) present.     Cervical back: Normal range of motion.     Right lower leg: No edema.     Left lower leg: No edema.  Skin:    General: Skin is warm and dry.     Capillary Refill: Capillary refill takes less than 2 seconds.     Findings: No rash.  Neurological:     General: No focal deficit present.     Mental Status: She is alert and oriented to person, place, and time.  Psychiatric:        Behavior: Behavior normal.        Thought Content: Thought content normal.     Wt Readings from Last 3 Encounters:  10/01/19 199 lb (90.3 kg)  08/27/19 201 lb (91.2 kg)  08/13/19 204 lb (92.5 kg)    BP 118/70   Pulse 84   Temp (!) 96.2 F (35.7 C) (Temporal)   Ht 5\' 9"  (1.753 m)   Wt 199 lb (90.3 kg)   SpO2 97%   BMI 29.39 kg/m   Assessment and Plan: 1. Type II diabetes mellitus with complication (HCC) Clinically stable by exam and report without s/s of hypoglycemia. DM complicated by HTN. Tolerating medications metformin, well without side effects or other concerns. Pt is reminded to schedule DM eye exam - Hemoglobin A1c - metFORMIN (GLUCOPHAGE-XR) 500 MG 24 hr tablet; Take 1 tablet (500 mg total) by mouth daily with breakfast.  Dispense: 90 tablet; Refill: 3  2. Essential (primary) hypertension Clinically stable exam with improved BP on higher dose amlodipine 20 mg, along with HCTZ and Avapro. Tolerating medications without side effects at this time. Pt to continue current regimen and low sodium diet; benefits of regular exercise as able discussed. - Basic metabolic panel - amLODipine (NORVASC) 10 MG tablet; Take 2 tablets (20 mg total) by mouth daily.  Dispense: 60 tablet; Refill: 5  3. Muscle cramps Will check  BMP and Mg++ Maintain hydration and will advise if mineral supplementation is needed - Magnesium   Partially dictated using Editor, commissioning. Any errors are unintentional.  Halina Maidens, MD [facility]  10/01/2019      

## 2019-10-01 NOTE — Patient Instructions (Signed)
Remember to schedule your diabetic eye exam soon

## 2019-10-03 ENCOUNTER — Other Ambulatory Visit: Payer: Self-pay

## 2019-10-03 ENCOUNTER — Ambulatory Visit: Payer: Medicare Other | Admitting: Anesthesiology

## 2019-10-03 ENCOUNTER — Encounter: Payer: Self-pay | Admitting: Gastroenterology

## 2019-10-03 ENCOUNTER — Encounter
Admission: RE | Disposition: A | Discharge status: Home / Self Care | Payer: Self-pay | Source: Home / Self Care | Attending: Gastroenterology

## 2019-10-03 ENCOUNTER — Ambulatory Visit
Admission: RE | Admit: 2019-10-03 | Discharge: 2019-10-03 | Disposition: A | Discharge status: Home / Self Care | Payer: Medicare Other | Attending: Gastroenterology | Admitting: Gastroenterology

## 2019-10-03 DIAGNOSIS — Z923 Personal history of irradiation: Secondary | ICD-10-CM | POA: Insufficient documentation

## 2019-10-03 DIAGNOSIS — K3189 Other diseases of stomach and duodenum: Secondary | ICD-10-CM

## 2019-10-03 DIAGNOSIS — Z79811 Long term (current) use of aromatase inhibitors: Secondary | ICD-10-CM | POA: Diagnosis not present

## 2019-10-03 DIAGNOSIS — Z7982 Long term (current) use of aspirin: Secondary | ICD-10-CM | POA: Insufficient documentation

## 2019-10-03 DIAGNOSIS — Z9221 Personal history of antineoplastic chemotherapy: Secondary | ICD-10-CM | POA: Insufficient documentation

## 2019-10-03 DIAGNOSIS — F17210 Nicotine dependence, cigarettes, uncomplicated: Secondary | ICD-10-CM | POA: Diagnosis not present

## 2019-10-03 DIAGNOSIS — E785 Hyperlipidemia, unspecified: Secondary | ICD-10-CM | POA: Diagnosis not present

## 2019-10-03 DIAGNOSIS — R634 Abnormal weight loss: Secondary | ICD-10-CM

## 2019-10-03 DIAGNOSIS — K2950 Unspecified chronic gastritis without bleeding: Secondary | ICD-10-CM | POA: Diagnosis not present

## 2019-10-03 DIAGNOSIS — R140 Abdominal distension (gaseous): Secondary | ICD-10-CM | POA: Diagnosis not present

## 2019-10-03 DIAGNOSIS — I10 Essential (primary) hypertension: Secondary | ICD-10-CM | POA: Insufficient documentation

## 2019-10-03 DIAGNOSIS — Z7984 Long term (current) use of oral hypoglycemic drugs: Secondary | ICD-10-CM | POA: Diagnosis not present

## 2019-10-03 DIAGNOSIS — E119 Type 2 diabetes mellitus without complications: Secondary | ICD-10-CM | POA: Insufficient documentation

## 2019-10-03 DIAGNOSIS — Z853 Personal history of malignant neoplasm of breast: Secondary | ICD-10-CM | POA: Insufficient documentation

## 2019-10-03 DIAGNOSIS — Z79899 Other long term (current) drug therapy: Secondary | ICD-10-CM | POA: Diagnosis not present

## 2019-10-03 DIAGNOSIS — K31A0 Gastric intestinal metaplasia, unspecified: Secondary | ICD-10-CM

## 2019-10-03 HISTORY — PX: ESOPHAGOGASTRODUODENOSCOPY (EGD) WITH PROPOFOL: SHX5813

## 2019-10-03 LAB — GLUCOSE, CAPILLARY: Glucose-Capillary: 125 mg/dL — ABNORMAL HIGH (ref 70–99)

## 2019-10-03 SURGERY — ESOPHAGOGASTRODUODENOSCOPY (EGD) WITH PROPOFOL
Anesthesia: General

## 2019-10-03 MED ORDER — PROPOFOL 500 MG/50ML IV EMUL
INTRAVENOUS | Status: DC | PRN
  Administered 2019-10-03: 150 ug/kg/min via INTRAVENOUS

## 2019-10-03 MED ORDER — MIDAZOLAM HCL 2 MG/2ML IJ SOLN
INTRAMUSCULAR | Status: AC
  Filled 2019-10-03: qty 2

## 2019-10-03 MED ORDER — PROPOFOL 10 MG/ML IV BOLUS
INTRAVENOUS | Status: DC | PRN
  Administered 2019-10-03: 100 mg via INTRAVENOUS

## 2019-10-03 MED ORDER — LIDOCAINE HCL (CARDIAC) PF 100 MG/5ML IV SOSY
PREFILLED_SYRINGE | INTRAVENOUS | Status: DC | PRN
  Administered 2019-10-03: 40 mg via INTRAVENOUS

## 2019-10-03 MED ORDER — LIDOCAINE HCL (PF) 2 % IJ SOLN
INTRAMUSCULAR | Status: AC
  Filled 2019-10-03: qty 10

## 2019-10-03 MED ORDER — MIDAZOLAM HCL 2 MG/2ML IJ SOLN
INTRAMUSCULAR | Status: DC | PRN
  Administered 2019-10-03: 2 mg via INTRAVENOUS

## 2019-10-03 MED ORDER — PHENYLEPHRINE HCL (PRESSORS) 10 MG/ML IV SOLN
INTRAVENOUS | Status: AC
  Filled 2019-10-03: qty 1

## 2019-10-03 MED ORDER — GLYCOPYRROLATE 0.2 MG/ML IJ SOLN
INTRAMUSCULAR | Status: AC
  Filled 2019-10-03: qty 1

## 2019-10-03 MED ORDER — SODIUM CHLORIDE 0.9 % IV SOLN: INTRAVENOUS | Status: DC

## 2019-10-03 MED ORDER — PROPOFOL 500 MG/50ML IV EMUL
INTRAVENOUS | Status: AC
  Filled 2019-10-03: qty 50

## 2019-10-03 MED ORDER — GLYCOPYRROLATE 0.2 MG/ML IJ SOLN
INTRAMUSCULAR | Status: DC | PRN
  Administered 2019-10-03: .2 mg via INTRAVENOUS

## 2019-10-03 NOTE — Transfer of Care (Signed)
Immediate Anesthesia Transfer of Care Note  Patient: Donna Riley  Procedure(s) Performed: Procedure(s): ESOPHAGOGASTRODUODENOSCOPY (EGD) WITH PROPOFOL (N/A)  Patient Location: PACU and Endoscopy Unit  Anesthesia Type:General  Level of Consciousness: sedated  Airway & Oxygen Therapy: Patient Spontanous Breathing and Patient connected to nasal cannula oxygen  Post-op Assessment: Report given to RN and Post -op Vital signs reviewed and stable  Post vital signs: Reviewed and stable  Last Vitals:  Vitals:   10/03/19 0730 10/03/19 0830  BP: (!) 148/69 139/79  Pulse: 81 92  Resp: 16 (!) 21  Temp: 36.4 C 36.8 C  SpO2: 123XX123 0000000    Complications: No apparent anesthesia complications

## 2019-10-03 NOTE — Anesthesia Procedure Notes (Signed)
Date/Time: 10/03/2019 8:14 AM Performed by: Doreen Salvage, CRNA Pre-anesthesia Checklist: Patient identified, Emergency Drugs available, Suction available and Patient being monitored Patient Re-evaluated:Patient Re-evaluated prior to induction Oxygen Delivery Method: Nasal cannula Induction Type: IV induction Dental Injury: Teeth and Oropharynx as per pre-operative assessment  Comments: Nasal cannula with etCO2 monitoring

## 2019-10-03 NOTE — Anesthesia Preprocedure Evaluation (Addendum)
Anesthesia Evaluation  Patient identified by MRN, date of birth, ID band Patient awake    Reviewed: Allergy & Precautions, NPO status , Patient's Chart, lab work & pertinent test results  History of Anesthesia Complications Negative for: history of anesthetic complications  Airway Mallampati: II  TM Distance: >3 FB Neck ROM: Full    Dental  (+) Edentulous Upper   Pulmonary neg sleep apnea, neg COPD, Current SmokerPatient did not abstain from smoking.,    Pulmonary exam normal breath sounds clear to auscultation       Cardiovascular Exercise Tolerance: Good METShypertension, (-) CAD and (-) Past MI (-) dysrhythmias  Rhythm:Regular Rate:Normal - Systolic murmurs    Neuro/Psych negative neurological ROS  negative psych ROS   GI/Hepatic GERD  Controlled,(+)     (-) substance abuse  ,   Endo/Other  diabetes  Renal/GU negative Renal ROS     Musculoskeletal  (+) Arthritis ,   Abdominal   Peds  Hematology   Anesthesia Other Findings Past Medical History: No date: Arthritis     Comment:  shoulders and neck 2016: Breast cancer ([place])     Comment:   Left breast with chemo + rad tx's with lumpectomy. 06/23/2016: Breast wound, left, sequela 06/2014: Diabetes mellitus without complication ([place]) No date: Elevated LFTs 08/01/2016: Fatigue No date: GERD (gastroesophageal reflux disease) No date: Gout 01/23/2015: Hydradenitis No date: Hyperlipidemia No date: Hypertension 10/15/2016: Mastitis 2016: Personal history of chemotherapy     Comment:  left breast ca 2016: Personal history of radiation therapy     Comment:  LEFT lumpectomy No date: Personal history of radiation therapy No date: Positive PPD, treated No date: Shortness of breath dyspnea     Comment:  with exertion  Reproductive/Obstetrics                            Anesthesia Physical Anesthesia Plan  ASA: II  Anesthesia Plan: General    Post-op Pain Management:    Induction: Intravenous  PONV Risk Score and Plan: 3 and Ondansetron, Propofol infusion and TIVA  Airway Management Planned: Nasal Cannula  Additional Equipment: None  Intra-op Plan:   Post-operative Plan:   Informed Consent: I have reviewed the patients History and Physical, chart, labs and discussed the procedure including the risks, benefits and alternatives for the proposed anesthesia with the patient or authorized representative who has indicated his/her understanding and acceptance.     Dental advisory given  Plan Discussed with: CRNA and Surgeon  Anesthesia Plan Comments: (Discussed risks of anesthesia with patient, including possibility of difficulty with spontaneous ventilation under anesthesia necessitating airway intervention, PONV, and rare risks such as cardiac or respiratory or neurological events. Patient understands.)        Anesthesia Quick Evaluation

## 2019-10-03 NOTE — H&P (Signed)
Vonda Antigua, MD [address] [place], [place], [place], [address] [address], [place], [place], [place], [address] Phone: [phone]  Fax: [phone]  Primary Care Physician:  Glean Hess, MD   Pre-Procedure History & Physical: HPI:  Donna Riley is a 59 y.o. female is here for an EGD.   Past Medical History:  Diagnosis Date  . Arthritis    shoulders and neck  . Breast cancer ([place]) 2016    Left breast with chemo + rad tx's with lumpectomy.  . Breast wound, left, sequela 06/23/2016  . Diabetes mellitus without complication ([place]) Q000111Q  . Elevated LFTs   . Fatigue 08/01/2016  . GERD (gastroesophageal reflux disease)   . Gout   . Hydradenitis 01/23/2015  . Hyperlipidemia   . Hypertension   . Mastitis 10/15/2016  . Personal history of chemotherapy 2016   left breast ca  . Personal history of radiation therapy 2016   LEFT lumpectomy  . Personal history of radiation therapy   . Positive PPD, treated   . Shortness of breath dyspnea    with exertion    Past Surgical History:  Procedure Laterality Date  . BREAST BIOPSY Left 07/31/2014   [facility] and DCIS   . BREAST BIOPSY Left 03/08/2016   INTRADUCTAL PAPILLOMA   . 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 BREAST LUMPECTOMY Left 04/05/2016   excision of intraductal papilloma, no malignancy or atypia  . BREAST LUMPECTOMY WITH NEEDLE LOCALIZATION Left 04/05/2016   Procedure: BREAST LUMPECTOMY WITH NEEDLE LOCALIZATION;  Surgeon: Hubbard Robinson, MD;  Location: [facility];  Service: General;  Laterality: Left;  . BREAST LUMPECTOMY WITH NEEDLE LOCALIZATION AND AXILLARY SENTINEL LYMPH NODE BX Left 08/27/14  . CHOLECYSTECTOMY  09/07/2013  . COLONOSCOPY WITH PROPOFOL N/A 08/28/2015   Procedure: COLONOSCOPY WITH PROPOFOL;  Surgeon: Lucilla Lame, MD;  Location: [place];  Service: Endoscopy;  Laterality: N/A;  Diabetic oral  . ESOPHAGOGASTRODUODENOSCOPY  2014   gastritis    . ESOPHAGOGASTRODUODENOSCOPY (EGD) WITH PROPOFOL N/A 04/16/2019   Procedure: ESOPHAGOGASTRODUODENOSCOPY (EGD) WITH PROPOFOL;  Surgeon: Virgel Manifold, MD;  Location: [facility];  Service: Endoscopy;  Laterality: N/A;  . POLYPECTOMY  08/28/2015   Procedure: POLYPECTOMY INTESTINAL;  Surgeon: Lucilla Lame, MD;  Location: [place];  Service: Endoscopy;;  Rectal polyp  . TUBAL LIGATION Bilateral   . UMBILICAL HERNIA REPAIR N/A 08/09/2016   Procedure: HERNIA REPAIR UMBILICAL ADULT;  Surgeon: Olean Ree, MD;  Location: [facility];  Service: General;  Laterality: N/A;  . VENTRAL HERNIA REPAIR N/A 04/10/2015   Procedure: HERNIA REPAIR VENTRAL ADULT;  Surgeon: Leonie Green, MD;  Location: [facility];  Service: General;  Laterality: N/A;    Prior to Admission medications   Medication Sig Start Date End Date Taking? Authorizing Provider  acetaminophen (TYLENOL) 325 MG tablet Take 650 mg by mouth daily as needed. 09/08/13  Yes [provider]  Alcohol Swabs (B-D SINGLE USE SWABS REGULAR) PADS 1 each by Does not apply route 2 (two) times daily. 11/22/17  Yes Glean Hess, MD  amLODipine (NORVASC) 10 MG tablet Take 2 tablets (20 mg total) by mouth daily. 10/01/19  Yes Glean Hess, MD  anastrozole (ARIMIDEX) 1 MG tablet Take 1 tablet (1 mg total) by mouth daily. 10/01/19  Yes Corcoran, Drue Second, MD  atorvastatin (LIPITOR) 10 MG tablet Take 1 tablet (10 mg total) by mouth daily. Patient taking differently: Take 10 mg  by mouth once a week.  04/12/18  Yes Glean Hess, MD  Blood Glucose Calibration (TRUE METRIX LEVEL 1) Low SOLN 1 each by In Vitro route daily as needed. 11/22/17  Yes Glean Hess, MD  hydrochlorothiazide (HYDRODIURIL) 25 MG tablet Take 25 mg by mouth daily.    Yes [provider]  irbesartan (AVAPRO) 300 MG tablet TAKE 1 TABLET BY MOUTH  DAILY 07/11/19  Yes Glean Hess, MD  metFORMIN (GLUCOPHAGE-XR) 500 MG 24 hr tablet Take 1 tablet  (500 mg total) by mouth daily with breakfast. 10/01/19  Yes Glean Hess, MD  naproxen sodium (ALEVE) 220 MG tablet Take 220 mg by mouth daily as needed.    Yes [provider]  aspirin EC 81 MG tablet Take 81 mg by mouth daily.    [provider]  Blood Glucose Monitoring Suppl (TRUE METRIX METER) DEVI 1 each by Does not apply route daily. 12/15/17   Glean Hess, MD    Allergies as of 08/15/2019  . (No Known Allergies)    Family History  Problem Relation Age of Onset  . Cancer Mother        Pancreatic  . Cancer Father        Throat  . Healthy Sister   . HIV Brother   . Cancer Maternal Grandmother        Breast  . Breast cancer Maternal Grandmother 75    Social History   Socioeconomic History  . Marital status: Widowed    Spouse name: Not on file  . Number of children: 4  . Years of education: Not on file  . Highest education level: 10th grade  Occupational History  . Occupation: Disabled  Tobacco Use  . Smoking status: Current Every Day Smoker    Packs/day: 0.50    Years: 38.00    Pack years: 19.00    Types: Cigarettes  . Smokeless tobacco: Never Used  Substance and Sexual Activity  . Alcohol use: Yes    Alcohol/week: 2.0 standard drinks    Types: 2 Cans of beer per week    Comment: Occasionally .beer yesterday morning  . Drug use: No  . Sexual activity: Not Currently  Other Topics Concern  . Not on file  Social History Narrative   Pt lives alone, son lives 2 doors down.   Social Determinants of Health   Financial Resource Strain: Low Risk   . Difficulty of Paying Living Expenses: Not very hard  Food Insecurity: No Food Insecurity  . Worried About Charity fundraiser in the Last Year: Never true  . Ran Out of Food in the Last Year: Never true  Transportation Needs: No Transportation Needs  . Lack of Transportation (Medical): No  . Lack of Transportation (Non-Medical): No  Physical Activity: Insufficiently Active  . Days of  Exercise per Week: 2 days  . Minutes of Exercise per Session: 30 min  Stress: Stress Concern Present  . Feeling of Stress : Rather much  Social Connections: Somewhat Isolated  . Frequency of Communication with Friends and Family: More than three times a week  . Frequency of Social Gatherings with Friends and Family: Three times a week  . Attends Religious Services: More than 4 times per year  . Active Member of Clubs or Organizations: No  . Attends Archivist Meetings: Never  . Marital Status: Widowed  Intimate Partner Violence: Not At Risk  . Fear of Current or Ex-Partner: No  .  Emotionally Abused: No  . Physically Abused: No  . Sexually Abused: No    Review of Systems: See HPI, otherwise negative ROS  Physical Exam: BP (!) 148/69   Pulse 81   Temp 97.6 F (36.4 C) (Temporal)   Resp 16   Ht 5\' 8"  (1.727 m)   Wt 90.3 kg   SpO2 100%   BMI 30.26 kg/m  General:   Alert,  pleasant and cooperative in NAD Head:  Normocephalic and atraumatic. Neck:  Supple; no masses or thyromegaly. Lungs:  Clear throughout to auscultation, normal respiratory effort.    Heart:  +S1, +S2, Regular rate and rhythm, No edema. Abdomen:  Soft, nontender and nondistended. Normal bowel sounds, without guarding, and without rebound.   Neurologic:  Alert and  oriented x4;  grossly normal neurologically.  Impression/Plan: [name] is here for an EGD for abdominal bloating, weight loss  Risks, benefits, limitations, and alternatives regarding the procedure have been reviewed with the patient.  Questions have been answered.  All parties agreeable.   Virgel Manifold, MD  10/03/2019, 8:13 AM

## 2019-10-03 NOTE — Anesthesia Postprocedure Evaluation (Signed)
Anesthesia Post Note  Patient: Donna Riley  Procedure(s) Performed: ESOPHAGOGASTRODUODENOSCOPY (EGD) WITH PROPOFOL (N/A )  Patient location during evaluation: Endoscopy Anesthesia Type: General Level of consciousness: awake and alert Pain management: pain level controlled Vital Signs Assessment: post-procedure vital signs reviewed and stable Respiratory status: spontaneous breathing, nonlabored ventilation, respiratory function stable and patient connected to nasal cannula oxygen Cardiovascular status: blood pressure returned to baseline and stable Postop Assessment: no apparent nausea or vomiting Anesthetic complications: no     Last Vitals:  Vitals:   10/03/19 0850 10/03/19 0900  BP: 138/88 (!) 150/88  Pulse: 80 72  Resp: 14 17  Temp:    SpO2: 95% 93%    Last Pain:  Vitals:   10/03/19 0900  TempSrc:   PainSc: 0-No pain                 Arita Miss

## 2019-10-03 NOTE — Op Note (Signed)
[facility] Gastroenterology Patient Name: Donna Riley Procedure Date: 10/03/2019 7:26 AM MRN: [id number] Account #: [id number] Date of Birth: 1961-04-10 Admit Type: Outpatient Age: 60 Room: [facility] ENDO ROOM 2 Gender: Female Note Status: Finalized Procedure:             Upper GI endoscopy Indications:           Abdominal bloating, Weight loss Providers:             Twila Rappa B. Bonna Gains MD, MD Referring MD:          Halina Maidens, MD (Referring MD) Medicines:             Monitored Anesthesia Care Complications:         No immediate complications. Procedure:             Pre-Anesthesia Assessment:                        - Prior to the procedure, a History and Physical was                         performed, and patient medications, allergies and                         sensitivities were reviewed. The patient's tolerance                         of previous anesthesia was reviewed.                        - The risks and benefits of the procedure and the                         sedation options and risks were discussed with the                         patient. All questions were answered and informed                         consent was obtained.                        - Patient identification and proposed procedure were                         verified prior to the procedure by the physician, the                         nurse, the anesthesiologist, the anesthetist and the                         technician. The procedure was verified in the                         procedure room.                        - ASA Grade Assessment: II - A patient with mild                         systemic  disease.                        After obtaining informed consent, the endoscope was                         passed under direct vision. Throughout the procedure,                         the patient's blood pressure, pulse, and oxygen                         saturations were monitored  continuously. The Endoscope                         was introduced through the mouth, and advanced to the                         second part of duodenum. The upper GI endoscopy was                         accomplished with ease. The patient tolerated the                         procedure well. Findings:      The examined esophagus was normal.      Patchy mildly erythematous mucosa without bleeding was found in the       gastric antrum. Biopsies were taken with a cold forceps for histology.       Biopsies were obtained in the gastric body, at the incisura and in the       gastric antrum with cold forceps for histology.      The duodenal bulb, second portion of the duodenum and examined duodenum       were normal. Impression:            - Normal esophagus.                        - Erythematous mucosa in the antrum. Biopsied.                        - Normal duodenal bulb, second portion of the duodenum                         and examined duodenum.                        - Biopsies were obtained in the gastric body, at the                         incisura and in the gastric antrum. Recommendation:        - Await pathology results.                        - Discharge patient to home (with escort).                        - Advance diet as tolerated.                        -  Continue present medications.                        - Patient has a contact number available for                         emergencies. The signs and symptoms of potential                         delayed complications were discussed with the patient.                         Return to normal activities tomorrow. Written                         discharge instructions were provided to the patient.                        - Discharge patient to home (with escort).                        - The findings and recommendations were discussed with                         the patient.                        - The findings and  recommendations were discussed with                         the patient's family. Procedure Code(s):     --- Professional ---                        [phone], Esophagogastroduodenoscopy, flexible,                         transoral; with biopsy, single or multiple Diagnosis Code(s):     --- Professional ---                        K31.89, Other diseases of stomach and duodenum                        R14.0, Abdominal distension (gaseous)                        R63.4, Abnormal weight loss CPT copyright 2019 American Medical Association. All rights reserved. The codes documented in this report are preliminary and upon coder review may  be revised to meet current compliance requirements.  Vonda Antigua, MD Margretta Sidle B. Bonna Gains MD, MD 10/03/2019 8:35:23 AM This report has been signed electronically. Number of Addenda: 0 Note Initiated On: 10/03/2019 7:26 AM Estimated Blood Loss:  Estimated blood loss: none.      [facility]

## 2019-10-04 ENCOUNTER — Encounter: Payer: Self-pay | Admitting: *Deleted

## 2019-10-08 LAB — SURGICAL PATHOLOGY

## 2019-10-10 ENCOUNTER — Telehealth: Payer: Self-pay

## 2019-10-10 DIAGNOSIS — K31A0 Gastric intestinal metaplasia, unspecified: Secondary | ICD-10-CM

## 2019-10-10 NOTE — Telephone Encounter (Signed)
Patient called requesting her EGD results from 10/03/2019. Can you please advise what I could tell her. Thank you.

## 2019-10-11 NOTE — Telephone Encounter (Signed)
Patient was contacted to let her know what her EGD results from her EGD were. Per Dr. Bonna Gains, these were her recommnedations: her biopsies did not show the H. pylori bacteria that was previously seen. However, due to her previous history of H. pylori, and gastritis, a confirmatory test to rule out H. pylori is recommended. Please order H. pylori breath test. She should avoid NSAID use such as Ibuprofen, Aleeve, advil, motrin, BC and Goodie powder, Naproxen, Meloxicam and others, And smoking as these can lead to gastritis. Patient stated that she would come in on Monday afternnon and have her H Pylori breath test done.  Patient had no further questions.

## 2019-10-14 ENCOUNTER — Other Ambulatory Visit: Payer: Self-pay

## 2019-10-14 MED ORDER — OMEPRAZOLE 20 MG PO CPDR
20.0000 mg | DELAYED_RELEASE_CAPSULE | Freq: Two times a day (BID) | ORAL | 0 refills | Status: DC
Start: 2019-10-14 — End: 2020-07-13

## 2019-10-15 LAB — H. PYLORI BREATH TEST: H pylori Breath Test: NEGATIVE

## 2019-10-16 ENCOUNTER — Encounter: Payer: Self-pay | Admitting: *Deleted

## 2019-10-22 ENCOUNTER — Telehealth: Payer: Self-pay

## 2019-10-22 NOTE — Telephone Encounter (Signed)
-----   Message from Virgel Manifold, MD sent at 10/18/2019  9:46 AM EDT ----- Herb Grays please let the patient know, her breath test was normal

## 2019-10-22 NOTE — Telephone Encounter (Signed)
Pt notified of h pylori breath test results.

## 2019-11-11 ENCOUNTER — Other Ambulatory Visit: Payer: Self-pay | Admitting: Internal Medicine

## 2019-11-11 DIAGNOSIS — I10 Essential (primary) hypertension: Secondary | ICD-10-CM

## 2019-11-11 MED ORDER — IRBESARTAN 300 MG PO TABS: 300.0000 mg | ORAL_TABLET | Freq: Every day | ORAL | 3 refills | Status: DC

## 2019-11-11 NOTE — Telephone Encounter (Signed)
PT need a refill  irbesartan (AVAPRO) 300 MG tablet [[id number]]  [place], [place] - [address]  [address] [place] [address]  Phone: [phone] Fax: [phone]

## 2019-12-25 ENCOUNTER — Other Ambulatory Visit: Payer: Self-pay

## 2019-12-26 ENCOUNTER — Encounter: Payer: Self-pay | Admitting: Radiation Oncology

## 2019-12-26 ENCOUNTER — Ambulatory Visit
Admission: RE | Admit: 2019-12-26 | Discharge: 2019-12-26 | Disposition: A | Discharge status: Home / Self Care | Payer: Medicare Other | Source: Ambulatory Visit | Attending: Radiation Oncology | Admitting: Radiation Oncology

## 2019-12-26 VITALS — BP 152/77 | HR 82 | Temp 96.9°F | Wt 198.0 lb

## 2019-12-26 DIAGNOSIS — R072 Precordial pain: Secondary | ICD-10-CM | POA: Insufficient documentation

## 2019-12-26 DIAGNOSIS — C50912 Malignant neoplasm of unspecified site of left female breast: Secondary | ICD-10-CM

## 2019-12-26 DIAGNOSIS — Z79811 Long term (current) use of aromatase inhibitors: Secondary | ICD-10-CM | POA: Insufficient documentation

## 2019-12-26 DIAGNOSIS — N644 Mastodynia: Secondary | ICD-10-CM | POA: Diagnosis not present

## 2019-12-26 DIAGNOSIS — Z170 Estrogen receptor positive status [ER+]: Secondary | ICD-10-CM | POA: Insufficient documentation

## 2019-12-26 DIAGNOSIS — C50212 Malignant neoplasm of upper-inner quadrant of left female breast: Secondary | ICD-10-CM | POA: Insufficient documentation

## 2019-12-26 NOTE — Progress Notes (Signed)
Radiation Oncology Follow up Note  Name: Donna Riley   Date:   12/26/2019 MRN:  [id number] DOB: [birth year]    This 59 y.o. female presents to the clinic today for 4-1/2-year follow-up status post whole breast radiation to her left breast for stage IIa invasive mammary carcinoma.  REFERRING PROVIDER: Glean Hess, MD  HPI: Patient is a 59 year old female now out close to 5 years having completed whole breast radiation to her left breast for ER/PR positive stage IIa invasive mammary carcinoma.  She is seen today in routine follow-up is doing fairly well she does complain of bilateral nipple pain and some midsternal chest pain of unknown etiology..  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  She had mammograms which I have reviewed last April which were BI-RADS 2 benign.  She is currently on Arimidex.  Her symptoms may be related to reflux disease she does occasionally take proton pump inhibitors i.e. Prilosec I have asked her to start doing that on a daily basis.  COMPLICATIONS OF TREATMENT: none  FOLLOW UP COMPLIANCE: keeps appointments   PHYSICAL EXAM:  BP (!) 152/77 (Cuff Size: Normal)   Pulse 82   Temp (!) 96.9 F (36.1 C) (Tympanic)   Wt 198 lb (89.8 kg)   BMI 30.11 kg/m  Left breast is contracted towards the scar with a fair cosmetic result.  No dominant mass or nodularity is noted in either breast in 2 positions examined.  Well-developed well-nourished patient in NAD. HEENT reveals PERLA, EOMI, discs not visualized.  Oral cavity is clear. No oral mucosal lesions are identified. Neck is clear without evidence of cervical or supraclavicular adenopathy. Lungs are clear to A&P. Cardiac examination is essentially unremarkable with regular rate and rhythm without murmur rub or thrill. Abdomen is benign with no organomegaly or masses noted. Motor sensory and DTR levels are equal and symmetric in the upper and lower extremities. Cranial nerves II through XII are  grossly intact. Proprioception is intact. No peripheral adenopathy or edema is identified. No motor or sensory levels are noted. Crude visual fields are within normal range.  RADIOLOGY RESULTS: Mammograms reviewed compatible with above-stated findings  PLAN: Present time patient is doing well with no evidence of disease.  Not sure if her chest pain do not think this is related to her breast cancer at all.  I have suggested she continues taking her Prilosec on a daily basis have asked her to contact her family doctor should these pains persist.  She was having back pain last-year-old that seems to have resolved.  Patient is now out close to 5 years I am discontinue follow-up care.  Be happy to reevaluate the patient anytime should that be indicated.  I would like to take this opportunity to thank you for allowing me to participate in the care of your patient.[name], MD

## 2020-01-14 ENCOUNTER — Encounter: Payer: Self-pay | Admitting: Internal Medicine

## 2020-01-14 ENCOUNTER — Ambulatory Visit (INDEPENDENT_AMBULATORY_CARE_PROVIDER_SITE_OTHER): Payer: Medicare Other | Admitting: Internal Medicine

## 2020-01-14 ENCOUNTER — Other Ambulatory Visit: Payer: Self-pay

## 2020-01-14 ENCOUNTER — Telehealth: Payer: Self-pay | Admitting: Internal Medicine

## 2020-01-14 VITALS — BP 128/74 | HR 78 | Temp 98.3°F | Ht 69.0 in | Wt 199.0 lb

## 2020-01-14 DIAGNOSIS — I10 Essential (primary) hypertension: Secondary | ICD-10-CM

## 2020-01-14 DIAGNOSIS — N898 Other specified noninflammatory disorders of vagina: Secondary | ICD-10-CM | POA: Diagnosis not present

## 2020-01-14 DIAGNOSIS — F17209 Nicotine dependence, unspecified, with unspecified nicotine-induced disorders: Secondary | ICD-10-CM | POA: Diagnosis not present

## 2020-01-14 LAB — POCT WET PREP WITH KOH
KOH Prep POC: NEGATIVE
RBC Wet Prep HPF POC: 0
Trichomonas, UA: NEGATIVE
WBC Wet Prep HPF POC: 10
Yeast Wet Prep HPF POC: 0

## 2020-01-14 MED ORDER — METRONIDAZOLE 500 MG PO TABS: 500.0000 mg | ORAL_TABLET | Freq: Two times a day (BID) | ORAL | 0 refills | Status: AC

## 2020-01-14 MED ORDER — FLUCONAZOLE 100 MG PO TABS: 100.0000 mg | ORAL_TABLET | Freq: Once | ORAL | 0 refills | Status: DC

## 2020-01-14 NOTE — Telephone Encounter (Signed)
Patient calling to inquire if Dr. Army Riley would send RX to pharmacy for BV. Patient states she has had it in the past and would like to know if that is something that can be done without office visit, as patient stated she was seen recently for the same thing.

## 2020-01-14 NOTE — Telephone Encounter (Signed)
Please call and schedule appt for pt to be seen for this problem. Today @1 :40pm or tomorrow @8 :40 or 10:40 or today.  Thank you, KP

## 2020-01-14 NOTE — Progress Notes (Signed)
Date:  01/14/2020   Name:  Donna Riley   DOB:  1961-04-01   MRN:  [id number]   Chief Complaint: Vaginitis (X1 weeks, burning out outside of vagina, burns when she wipes)  Vaginal Discharge The patient's primary symptoms include genital itching. The patient's pertinent negatives include no vaginal bleeding or vaginal discharge. Primary symptoms comment: and burning on the outside. This is a new problem. The current episode started in the past 7 days. The problem occurs daily. The problem has been unchanged. The patient is experiencing no pain. Associated symptoms include dysuria. Pertinent negatives include no abdominal pain, chills, fever, frequency or urgency. She is not sexually active.  Hypertension This is a chronic problem. The problem is controlled. Pertinent negatives include no chest pain or shortness of breath. Past treatments include angiotensin blockers, calcium channel blockers and diuretics. The current treatment provides significant improvement.    Lab Results  Component Value Date   CREATININE 1.02 (H) 10/01/2019   BUN 24 (H) 10/01/2019   NA 134 (L) 10/01/2019   K 4.1 10/01/2019   CL 99 10/01/2019   CO2 25 10/01/2019   Lab Results  Component Value Date   CHOL 177 10/09/2018   HDL 37 (L) 10/09/2018   LDLCALC 70 10/09/2018   TRIG 349 (H) 10/09/2018   CHOLHDL 4.8 10/09/2018   Lab Results  Component Value Date   TSH 1.765 04/11/2018   Lab Results  Component Value Date   HGBA1C 6.3 (H) 10/01/2019   Lab Results  Component Value Date   WBC 6.5 08/08/2019   HGB 13.2 08/08/2019   HCT 41.6 08/08/2019   MCV 86.0 08/08/2019   PLT 209 08/08/2019   Lab Results  Component Value Date   ALT 20 08/08/2019   AST 19 08/08/2019   ALKPHOS 94 08/08/2019   BILITOT 0.5 08/08/2019     Review of Systems  Constitutional: Negative for chills, fatigue and fever.  Respiratory: Negative for chest tightness and shortness of breath.   Cardiovascular: Negative for chest  pain.  Gastrointestinal: Negative for abdominal pain.  Genitourinary: Positive for dysuria and genital sores (painful with urination). Negative for frequency, urgency and vaginal discharge.    Patient Active Problem List   Diagnosis Date Noted  . Gastritis determined by endoscopy 06/03/2019  . Abdominal bloating   . Nausea   . Secondary and unspecified malignant neoplasm of axilla and upper limb lymph nodes ([place]) 02/18/2019  . Vertigo 10/09/2018  . Acute midline low back pain without sciatica 10/09/2018  . Hyperlipidemia associated with type 2 diabetes mellitus ([place]) 04/11/2018  . Gastroparesis 04/11/2018  . Long term (current) use of aromatase inhibitors 12/25/2017  . Arthritis   . Elevated LFTs   . Positive PPD, treated   . Umbilical hernia without obstruction and without gangrene 07/13/2016  . Rectal polyp   . Irritable bowel syndrome with both constipation and diarrhea 07/20/2015  . Type II diabetes mellitus with complication ([place]) 50/35/4656  . Pre-ulcerative calluses 06/22/2015  . Neuropathy 06/22/2015  . Hot flash, menopausal 04/09/2015  . Essential (primary) hypertension 03/25/2015  . Osteoporosis of lumbar spine 03/12/2015  . Malignant neoplasm of left breast in female, estrogen receptor positive ([place]) 05/30/2014  . 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 BREAST BIOPSY Left 07/31/2014   [facility] and [facility]   . BREAST BIOPSY Left 03/08/2016   INTRADUCTAL PAPILLOMA   . BREAST LUMPECTOMY Left  07/2014   [facility], DC[facility], clear margins, positive LN  . BREAST LUMPECTOMY Left 04/05/2016   excision of intraductal papilloma, no malignancy or atypia  . BREAST LUMPECTOMY WITH NEEDLE LOCALIZATION Left 04/05/2016   Procedure: BREAST LUMPECTOMY WITH NEEDLE LOCALIZATION;  Surgeon: Hubbard Robinson, MD;  Location: [facility] ORS;  Service: General;  Laterality: Left;  . BREAST LUMPECTOMY WITH NEEDLE LOCALIZATION AND  AXILLARY SENTINEL LYMPH NODE BX Left 08/27/14  . CHOLECYSTECTOMY  09/07/2013  . COLONOSCOPY WITH PROPOFOL N/A 08/28/2015   Procedure: COLONOSCOPY WITH PROPOFOL;  Surgeon: Lucilla Lame, MD;  Location: [place];  Service: Endoscopy;  Laterality: N/A;  Diabetic oral  . ESOPHAGOGASTRODUODENOSCOPY  2014   gastritis  . ESOPHAGOGASTRODUODENOSCOPY (EGD) WITH PROPOFOL N/A 04/16/2019   Procedure: ESOPHAGOGASTRODUODENOSCOPY (EGD) WITH PROPOFOL;  Surgeon: Virgel Manifold, MD;  Location: [facility];  Service: Endoscopy;  Laterality: N/A;  . ESOPHAGOGASTRODUODENOSCOPY (EGD) WITH PROPOFOL N/A 10/03/2019   Procedure: ESOPHAGOGASTRODUODENOSCOPY (EGD) WITH PROPOFOL;  Surgeon: Virgel Manifold, MD;  Location: [facility] END[facility];  Service: Endoscopy;  Laterality: N/A;  . POLYPECTOMY  08/28/2015   Procedure: POLYPECTOMY INTESTINAL;  Surgeon: Lucilla Lame, MD;  Location: [facility];  Service: Endoscopy;;  Rectal polyp  . TUBAL LIGATION Bilateral   . UMBILICAL HERNIA REPAIR N/A 08/09/2016   Procedure: HERNIA REPAIR UMBILICAL ADULT;  Surgeon: Olean Ree, MD;  Location: [facility];  Service: General;  Laterality: N/A;  . VENTRAL HERNIA REPAIR N/A 04/10/2015   Procedure: HERNIA REPAIR VENTRAL ADULT;  Surgeon: Leonie Green, MD;  Location: [facility];  Service: General;  Laterality: N/A;    Social History   Tobacco Use  . Smoking status: Current Every Day Smoker    Packs/day: 0.50    Years: 39.00    Pack years: 19.50    Types: Cigarettes    Start date: 05/30/1980  . Smokeless tobacco: Never Used  Vaping Use  . Vaping Use: Never used  Substance Use Topics  . Alcohol use: Yes    Alcohol/week: 2.0 standard drinks    Types: 2 Cans of beer per week    Comment: Occasionally .beer yesterday morning  . Drug use: No     Medication list has been reviewed and updated.  Current Meds  Medication Sig  . acetaminophen (TYLENOL) 325 MG tablet Take 650 mg by mouth daily as needed.  . Alcohol  Swabs (B-D SINGLE USE SWABS REGULAR) PADS 1 each by Does not apply route 2 (two) times daily.  Marland Kitchen amLODipine (NORVASC) 10 MG tablet Take 2 tablets (20 mg total) by mouth daily.  Marland Kitchen anastrozole (ARIMIDEX) 1 MG tablet Take 1 tablet (1 mg total) by mouth daily.  Marland Kitchen aspirin EC 81 MG tablet Take 81 mg by mouth daily.  Marland Kitchen atorvastatin (LIPITOR) 10 MG tablet Take 1 tablet (10 mg total) by mouth daily. (Patient taking differently: Take 10 mg by mouth once a week. )  . Blood Glucose Calibration (TRUE METRIX LEVEL 1) Low SOLN 1 each by In Vitro route daily as needed.  . Blood Glucose Monitoring Suppl (TRUE METRIX METER) DEVI 1 each by Does not apply route daily.  . hydrochlorothiazide (HYDRODIURIL) 25 MG tablet Take 25 mg by mouth daily.   . irbesartan (AVAPRO) 300 MG tablet Take 1 tablet (300 mg total) by mouth daily.  . metFORMIN (GLUCOPHAGE-XR) 500 MG 24 hr tablet Take 1 tablet (500 mg total) by mouth daily with breakfast.  . omeprazole (PRILOSEC) 20 MG capsule Take 1 capsule (20 mg total) by mouth  2 (two) times daily before a meal.  . [DISCONTINUED] naproxen sodium (ALEVE) 220 MG tablet Take 220 mg by mouth daily as needed.     PHQ 2/9 Scores 10/01/2019 08/27/2019 08/13/2019 07/10/2019  PHQ - 2 Score 0 0 0 1  PHQ- 9 Score 0 6 4 4     GAD 7 : Generalized Anxiety Score 10/01/2019  Nervous, Anxious, on Edge 0  Control/stop worrying 0  Worry too much - different things 0  Trouble relaxing 0  Restless 0  Easily annoyed or irritable 0  Afraid - awful might happen 0  Total GAD 7 Score 0  Anxiety Difficulty Not difficult at all    BP Readings from Last 3 Encounters:  01/14/20 128/74  12/26/19 (!) 152/77  10/03/19 (!) 150/88    Physical Exam Vitals and nursing note reviewed.  Constitutional:      General: She is not in acute distress.    Appearance: She is well-developed.  HENT:     Head: Normocephalic and atraumatic.  Cardiovascular:     Rate and Rhythm: Normal rate and regular rhythm.    Pulmonary:     Effort: Pulmonary effort is normal. No respiratory distress.     Breath sounds: No wheezing or rhonchi.  Abdominal:     Palpations: Abdomen is soft.     Tenderness: There is no abdominal tenderness. There is no right CVA tenderness or left CVA tenderness.  Musculoskeletal:     Cervical back: Normal range of motion.     Right lower leg: No edema.     Left lower leg: No edema.  Skin:    General: Skin is warm and dry.     Findings: No rash.  Neurological:     Mental Status: She is alert and oriented to person, place, and time.  Psychiatric:        Mood and Affect: Mood normal.     Wt Readings from Last 3 Encounters:  01/14/20 199 lb (90.3 kg)  12/26/19 198 lb (89.8 kg)  10/03/19 199 lb (90.3 kg)    BP 128/74   Pulse 78   Temp 98.3 F (36.8 C) (Oral)   Ht 5\' 9"  (1.753 m)   Wt 199 lb (90.3 kg)   SpO2 97%   BMI 29.39 kg/m   Assessment and Plan: 1. Vaginal itching - POCT Wet Prep with KOH - fluconazole (DIFLUCAN) 100 MG tablet; Take 1 tablet (100 mg total) by mouth once for 1 dose.  Dispense: 1 tablet; Refill: 0 - metroNIDAZOLE (FLAGYL) 500 MG tablet; Take 1 tablet (500 mg total) by mouth 2 (two) times daily for 7 days.  Dispense: 14 tablet; Refill: 0  2. Tobacco use disorder, continuous She qualifies for LDCT screening and is interested in this Will send message to Burgess Estelle for review  3. Essential (primary) hypertension Clinically stable exam with well controlled BP. Tolerating medications without side effects at this time. Pt to continue current regimen and low sodium diet; benefits of regular exercise as able discussed.   Partially dictated using Editor, commissioning. Any errors are unintentional.  Halina Maidens, MD [facility]  01/14/2020

## 2020-01-20 ENCOUNTER — Telehealth: Payer: Self-pay

## 2020-01-20 NOTE — Telephone Encounter (Signed)
Contacted patient to discuss lung CT screening clinic based on referral from Dr Carolin Coy.  Patient states she remembers discussion of program with Dr. Carolin Coy and wants to participate.  I explained program to her and have scheduled her for CT scan and joint decision making visit Sept 8 at 10:00am.  Patient's smoking status is current smoker-started at age 59 and currently smokes less than a pack a day.  Patient has [employer].  Patient knows where to go for scan.  We did discuss her breast cancer status and she shares she is 4 1/2 years out from diagnosis.  She is currently following up with Dr. Mike Gip for continued hormone replacement therapy.

## 2020-01-21 ENCOUNTER — Encounter: Payer: Self-pay | Admitting: *Deleted

## 2020-01-21 ENCOUNTER — Other Ambulatory Visit: Payer: Self-pay | Admitting: *Deleted

## 2020-01-21 DIAGNOSIS — Z87891 Personal history of nicotine dependence: Secondary | ICD-10-CM

## 2020-01-21 DIAGNOSIS — Z122 Encounter for screening for malignant neoplasm of respiratory organs: Secondary | ICD-10-CM

## 2020-01-21 NOTE — Progress Notes (Signed)
Smoking history, current, 33 pack year

## 2020-01-30 ENCOUNTER — Other Ambulatory Visit: Payer: Self-pay | Admitting: Hematology and Oncology

## 2020-01-30 DIAGNOSIS — C50912 Malignant neoplasm of unspecified site of left female breast: Secondary | ICD-10-CM

## 2020-01-30 DIAGNOSIS — Z170 Estrogen receptor positive status [ER+]: Secondary | ICD-10-CM

## 2020-01-30 NOTE — Telephone Encounter (Signed)
  Refill sent in for Armidex.  Continue Armidex through mid 02/2020.  BCI testing revealed no benefit after 5 years of treatment.  Risk of recurrence between years 5-10 is 10.1% (CI: 5.6-14.3%).  If she would like to discuss further, be can set up a video visit next week.   Lequita Asal, MD

## 2020-02-05 ENCOUNTER — Inpatient Hospital Stay: Payer: Medicare Other | Attending: Oncology | Admitting: Oncology

## 2020-02-05 ENCOUNTER — Ambulatory Visit
Admission: RE | Admit: 2020-02-05 | Discharge: 2020-02-05 | Disposition: A | Discharge status: Home / Self Care | Payer: Medicare Other | Source: Ambulatory Visit | Attending: Oncology | Admitting: Oncology

## 2020-02-05 ENCOUNTER — Other Ambulatory Visit: Payer: Self-pay

## 2020-02-05 DIAGNOSIS — Z122 Encounter for screening for malignant neoplasm of respiratory organs: Secondary | ICD-10-CM | POA: Diagnosis not present

## 2020-02-05 DIAGNOSIS — Z87891 Personal history of nicotine dependence: Secondary | ICD-10-CM

## 2020-02-05 DIAGNOSIS — F17210 Nicotine dependence, cigarettes, uncomplicated: Secondary | ICD-10-CM | POA: Insufficient documentation

## 2020-02-05 NOTE — Progress Notes (Signed)
Virtual Visit via Video Note  I connected with Donna Riley on 02/05/20 at 10:00 AM EDT by a video enabled telemedicine application and verified that I am speaking with the correct person using two identifiers.  Location: Patient: [facility] Provider: Clinic    I discussed the limitations of evaluation and management by telemedicine and the availability of in person appointments. The patient expressed understanding and agreed to proceed.  I discussed the assessment and treatment plan with the patient. The patient was provided an opportunity to ask questions and all were answered. The patient agreed with the plan and demonstrated an understanding of the instructions.   The patient was advised to call back or seek an in-person evaluation if the symptoms worsen or if the condition fails to improve as anticipated.   In accordance with CMS guidelines, patient has met eligibility criteria including age, absence of signs or symptoms of lung cancer.  Social History   Tobacco Use  . Smoking status: Current Every Day Smoker    Packs/day: 0.75    Years: 44.00    Pack years: 33.00    Types: Cigarettes  . Smokeless tobacco: Never Used  Vaping Use  . Vaping Use: Never used  Substance Use Topics  . Alcohol use: Yes    Alcohol/week: 2.0 standard drinks    Types: 2 Cans of beer per week    Comment: Occasionally .beer yesterday morning  . Drug use: No      A shared decision-making session was conducted prior to the performance of CT scan. This includes one or more decision aids, includes benefits and harms of screening, follow-up diagnostic testing, over-diagnosis, false positive rate, and total radiation exposure.   Counseling on the importance of adherence to annual lung cancer LDCT screening, impact of co-morbidities, and ability or willingness to undergo diagnosis and treatment is imperative for compliance of the program.   Counseling on the importance of continued smoking cessation for former  smokers; the importance of smoking cessation for current smokers, and information about tobacco cessation interventions have been given to patient including [place] and [facility] quit [place] programs.   Written order for lung cancer screening with LDCT has been given to the patient and any and all questions have been answered to the best of my abilities.    Yearly follow up will be coordinated by Burgess Estelle, Thoracic Navigator.  I provided 15 minutes of face-to-face video visit time during this encounter, and > 50% was spent counseling as documented under my assessment & plan.   Jacquelin Hawking, NP

## 2020-02-06 ENCOUNTER — Encounter: Payer: Self-pay | Admitting: Internal Medicine

## 2020-02-06 ENCOUNTER — Telehealth: Payer: Self-pay | Admitting: *Deleted

## 2020-02-06 DIAGNOSIS — I700 Atherosclerosis of aorta: Secondary | ICD-10-CM | POA: Insufficient documentation

## 2020-02-06 NOTE — Telephone Encounter (Signed)
Notified patient of LDCT lung cancer screening program results with recommendation for 12 month follow up imaging. Also notified of incidental findings noted below and is encouraged to discuss further with PCP who will receive a copy of this note and/or the CT report. Patient verbalizes understanding.   IMPRESSION: 1. Lung-RADS 2S, benign appearance or behavior. Continue annual screening with low-dose chest CT without contrast in 12 months. 2. The "S" modifier above refers to potentially clinically significant non lung cancer related findings. Specifically, there is aortic atherosclerosis, in addition to left anterior descending coronary artery disease. Please note that although the presence of coronary artery calcium documents the presence of coronary artery disease, the severity of this disease and any potential stenosis cannot be assessed on this non-gated CT examination. Assessment for potential risk factor modification, dietary therapy or pharmacologic therapy may be warranted, if clinically indicated. 3. Mild diffuse bronchial wall thickening with mild centrilobular and paraseptal emphysema; imaging findings suggestive of underlying COPD.  Aortic Atherosclerosis (ICD10-I70.0) and Emphysema (ICD10-J43.9).

## 2020-02-10 NOTE — Progress Notes (Signed)
[facility]  [address], [address] [place], [place] [address] Phone: [phone]  Fax: [phone]   Clinic Day:  02/11/2020  Referring physician: Glean Hess, MD  Chief Complaint: Donna Riley is a 59 y.o. female with stage IIA left breast cancer who is seen for 6 month assessment.    HPI: The patient was last seen in the medical oncology clinic on 08/13/2019. At that time, she had chronic intermittent breast pain.  Shoulder pain was better.  She had chronic low back pain.  Exam was stable. Hematocrit was 41.6, hemoglobin 13.2, platelets 209,000, WBC 6,500. CMP was normal. CA 27.29 was 19.2. She continued anastrozole.   Diagnostic bilateral mammogram on 09/19/2019 revealed stable postsurgical changes in the left breast. There was no evidence of malignancy.  EGD on 10/03/2019 by Dr. Bonna Gains revealed erythematous mucosa in the antrum. Pathology revealed antral and oxyntic mucosa with moderate chronic inactive gastritis. H. Pylori was negative. There was no intestinal metaplasia, dysplasia, and malignancy.  Low dose chest CT on 02/05/2020 revealed Lung-RADS 2S, benign appearance or behavior. Continued annual screening with low-dose chest CT without contrast in 12 months was recommended. There was aortic atherosclerosis, in addition to left anterior descending coronary artery disease. There was mild diffuse bronchial wall thickening with mild centrilobular and paraseptal emphysema suggestive of underlying COPD.  During the interim, she has been fine. She is still having intermittent breast pain. She has never followed up with surgery for this problem. She performs monthly breast self-exams and has no concerns. She notes left axillary tenderness. She is still taking anastrozole with no problems.  The patient has decreased her smoking to about 6 cigarettes daily. She notices that when she smokes, her feet swell. She also reports back cramps and has not had an  injection in a long time. She still has some shoulder pain. She has never been diagnosed with fibromyalgia.   Past Medical History:  Diagnosis Date  . Arthritis    shoulders and neck  . Breast cancer ([place]) 2016    Left breast with chemo + rad tx's with lumpectomy.  . Breast wound, left, sequela 06/23/2016  . Diabetes mellitus without complication ([place]) 06/6946  . Elevated LFTs   . Fatigue 08/01/2016  . GERD (gastroesophageal reflux disease)   . Gout   . Hydradenitis 01/23/2015  . Hyperlipidemia   . Hypertension   . Mastitis 10/15/2016  . Personal history of chemotherapy 2016   left breast ca  . Personal history of radiation therapy 2016   LEFT lumpectomy  . Personal history of radiation therapy   . Positive PPD, treated   . Shortness of breath dyspnea    with exertion    Past Surgical History:  Procedure Laterality Date  . BREAST BIOPSY Left 07/31/2014   [facility] and DCIS   . BREAST BIOPSY Left 03/08/2016   INTRADUCTAL PAPILLOMA   . 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 BREAST LUMPECTOMY Left 04/05/2016   excision of intraductal papilloma, no malignancy or atypia  . BREAST LUMPECTOMY WITH NEEDLE LOCALIZATION Left 04/05/2016   Procedure: BREAST LUMPECTOMY WITH NEEDLE LOCALIZATION;  Surgeon: Hubbard Robinson, MD;  Location: [facility] ORS;  Service: General;  Laterality: Left;  . BREAST LUMPECTOMY WITH NEEDLE LOCALIZATION AND AXILLARY SENTINEL LYMPH NODE BX Left 08/27/14  . CHOLECYSTECTOMY  09/07/2013  . COLONOSCOPY WITH PROPOFOL N/A 08/28/2015   Procedure: COLONOSCOPY WITH PROPOFOL;  Surgeon: Lucilla Lame, MD;  Location: [facility];  Service: Endoscopy;  Laterality: N/A;  Diabetic oral  . ESOPHAGOGASTRODUODENOSCOPY  2014   gastritis  . ESOPHAGOGASTRODUODENOSCOPY (EGD) WITH PROPOFOL N/A 04/16/2019   Procedure: ESOPHAGOGASTRODUODENOSCOPY (EGD) WITH PROPOFOL;  Surgeon: Virgel Manifold, MD;  Location: [facility] ENDOSCOPY;  Service: Endoscopy;   Laterality: N/A;  . ESOPHAGOGASTRODUODENOSCOPY (EGD) WITH PROPOFOL N/A 10/03/2019   Procedure: ESOPHAGOGASTRODUODENOSCOPY (EGD) WITH PROPOFOL;  Surgeon: Virgel Manifold, MD;  Location: [facility] ENDOSCOPY;  Service: Endoscopy;  Laterality: N/A;  . POLYPECTOMY  08/28/2015   Procedure: POLYPECTOMY INTESTINAL;  Surgeon: Lucilla Lame, MD;  Location: [place];  Service: Endoscopy;;  Rectal polyp  . TUBAL LIGATION Bilateral   . UMBILICAL HERNIA REPAIR N/A 08/09/2016   Procedure: HERNIA REPAIR UMBILICAL ADULT;  Surgeon: Olean Ree, MD;  Location: [facility] ORS;  Service: General;  Laterality: N/A;  . VENTRAL HERNIA REPAIR N/A 04/10/2015   Procedure: HERNIA REPAIR VENTRAL ADULT;  Surgeon: Leonie Green, MD;  Location: [facility] OR[facility];  Service: General;  Laterality: N/A;    Family History  Problem Relation Age of Onset  . Cancer Mother        Pancreatic  . Cancer Father        Throat  . Healthy Sister   . HIV Brother   . Cancer Maternal Grandmother        Breast  . Breast cancer Maternal Grandmother 75    Social History:  reports that she has been smoking cigarettes. She has a 33.00 pack-year smoking history. She has never used smokeless tobacco. She reports current alcohol use of about 2.0 standard drinks of alcohol per week. She reports that she does not use drugs.  She has cut back on smoking to 6 cigarettes daily. She lost her husband in 05/2018.  She lives in [place]. The patient is alone today.  Allergies: No Known Allergies  Current Medications: Current Outpatient Medications  Medication Sig Dispense Refill  . acetaminophen (TYLENOL) 325 MG tablet Take 650 mg by mouth daily as needed.    . Alcohol Swabs (B-D SINGLE USE SWABS REGULAR) PADS 1 each by Does not apply route 2 (two) times daily. 100 each 3  . amLODipine (NORVASC) 10 MG tablet Take 2 tablets (20 mg total) by mouth daily. 60 tablet 5  . anastrozole (ARIMIDEX) 1 MG tablet TAKE ONE TABLET BY MOUTH ONCE DAILY 45 tablet 0  .  aspirin EC 81 MG tablet Take 81 mg by mouth daily.    Marland Kitchen atorvastatin (LIPITOR) 10 MG tablet Take 1 tablet (10 mg total) by mouth daily. (Patient taking differently: Take 10 mg by mouth once a week. ) 90 tablet 1  . Blood Glucose Calibration (TRUE METRIX LEVEL 1) Low SOLN 1 each by In Vitro route daily as needed. 1 each 3  . Blood Glucose Monitoring Suppl (TRUE METRIX METER) DEVI 1 each by Does not apply route daily. 100 Device 3  . hydrochlorothiazide (HYDRODIURIL) 25 MG tablet Take 25 mg by mouth daily.     . irbesartan (AVAPRO) 300 MG tablet Take 1 tablet (300 mg total) by mouth daily. 90 tablet 3  . metFORMIN (GLUCOPHAGE-XR) 500 MG 24 hr tablet Take 1 tablet (500 mg total) by mouth daily with breakfast. 90 tablet 3  . omeprazole (PRILOSEC) 20 MG capsule Take 1 capsule (20 mg total) by mouth 2 (two) times daily before a meal. 60 capsule 0   No current facility-administered medications for this visit.   Review of Systems  Constitutional: Positive for weight loss (  4 lbs). Negative for chills, diaphoresis, fever and malaise/fatigue.       Feels good.  HENT: Negative.  Negative for congestion, ear discharge, ear pain, hearing loss, nosebleeds, sinus pain, sore throat and tinnitus.   Eyes: Negative.  Negative for blurred vision and double vision.  Respiratory: Negative.  Negative for cough, hemoptysis, sputum production, shortness of breath and wheezing.   Cardiovascular: Positive for chest pain (bilateral breast sharp pain- chronic). Negative for palpitations, orthopnea, leg swelling (feet swelling when she smokes) and PND.       Hypertension.  Gastrointestinal: Negative for abdominal pain, blood in stool, constipation, diarrhea, heartburn, melena, nausea and vomiting.       Gastroparesis.  Interval h/o H pylori gastritis.  Genitourinary: Negative for dysuria, frequency, hematuria and urgency.  Musculoskeletal: Positive for back pain (chronic lower back cramps- no longer receiving injections) and  joint pain (shoulder). Negative for falls, myalgias and neck pain.       Left axillary tenderness.  Skin: Negative.  Negative for itching and rash.  Neurological: Negative.  Negative for dizziness, tremors, sensory change, speech change, focal weakness, weakness and headaches.  Endo/Heme/Allergies: Negative.  Does not bruise/bleed easily.  Psychiatric/Behavioral: Negative for depression and memory loss. The patient is not nervous/anxious and does not have insomnia.   All other systems reviewed and are negative.  Performance status (ECOG): 1   Vitals Blood pressure (!) 146/84, pulse 75, temperature 97.7 F (36.5 C), temperature source Tympanic, resp. rate 18, height 5' 7" (1.702 m), weight 200 lb 6.4 oz (90.9 kg), SpO2 100 %.   Physical Exam Vitals and nursing note reviewed.  Constitutional:      General: She is not in acute distress.    Appearance: She is well-developed. She is not diaphoretic.  HENT:     Head: Normocephalic and atraumatic.     Comments: Shoulderlength hair.    Mouth/Throat:     Pharynx: No oropharyngeal exudate.  Eyes:     General: No scleral icterus.    Conjunctiva/sclera: Conjunctivae normal.     Pupils: Pupils are equal, round, and reactive to light.     Comments: Brown eyes.  Neck:     Vascular: No JVD.  Cardiovascular:     Rate and Rhythm: Normal rate and regular rhythm.     Pulses: Normal pulses.     Heart sounds: Normal heart sounds. No murmur heard.   Pulmonary:     Effort: Pulmonary effort is normal. No respiratory distress.     Breath sounds: Normal breath sounds. No wheezing, rhonchi or rales.  Chest:     Breasts:        Right: Tenderness present. No inverted nipple, mass, nipple discharge or skin change.        Left: Tenderness present. No inverted nipple, mass, nipple discharge or skin change.     Comments: Left breast scarring at the 9 o'clock position.  Right breast with fibrocystic changes. Abdominal:     General: Bowel sounds are normal.  There is no distension.     Palpations: Abdomen is soft. There is no mass.     Tenderness: There is abdominal tenderness (left side). There is no guarding or rebound.  Musculoskeletal:        General: Tenderness (left axilla) present.     Cervical back: Normal range of motion and neck supple.     Right lower leg: No edema.     Left lower leg: No edema.     Comments: Brace on  right ankle.  Lymphadenopathy:     Cervical: No cervical adenopathy.     Upper Body:     Right upper body: No supraclavicular adenopathy.     Left upper body: No supraclavicular adenopathy.  Skin:    General: Skin is warm and dry.     Coloration: Skin is not pale.     Findings: No erythema or rash.  Neurological:     Mental Status: She is alert and oriented to person, place, and time.     Deep Tendon Reflexes: Reflexes are normal and symmetric.  Psychiatric:        Behavior: Behavior normal.        Thought Content: Thought content normal.        Judgment: Judgment normal.    Appointment on 02/11/2020  Component Date Value Ref Range Status  . Sodium 02/11/2020 139  135 - 145 mmol/L Final  . Potassium 02/11/2020 3.5  3.5 - 5.1 mmol/L Final  . Chloride 02/11/2020 104  98 - 111 mmol/L Final  . CO2 02/11/2020 24  22 - 32 mmol/L Final  . Glucose, Bld 02/11/2020 130* 70 - 99 mg/dL Final   Glucose reference range applies only to samples taken after fasting for at least 8 hours.  . BUN 02/11/2020 14  6 - 20 mg/dL Final  . Creatinine, Ser 02/11/2020 0.82  0.44 - 1.00 mg/dL Final  . Calcium 02/11/2020 9.0  8.9 - 10.3 mg/dL Final  . Total Protein 02/11/2020 7.6  6.5 - 8.1 g/dL Final  . Albumin 02/11/2020 4.3  3.5 - 5.0 g/dL Final  . AST 02/11/2020 23  15 - 41 U/L Final  . ALT 02/11/2020 33  0 - 44 U/L Final  . Alkaline Phosphatase 02/11/2020 83  38 - 126 U/L Final  . Total Bilirubin 02/11/2020 0.4  0.3 - 1.2 mg/dL Final  . GFR calc non Af Amer 02/11/2020 >60  >60 mL/min Final  . GFR calc Af Amer 02/11/2020 >60   >60 mL/min Final  . Anion gap 02/11/2020 11  5 - 15 Final   Performed at [facility], [address] [place], [place] [address]  . WBC 02/11/2020 6.6  4.0 - 10.5 K/uL Final  . RBC 02/11/2020 5.06  3.87 - 5.11 MIL/uL Final  . Hemoglobin 02/11/2020 14.0  12.0 - 15.0 g/dL Final  . HCT 02/11/2020 43.3  36 - 46 % Final  . MCV 02/11/2020 85.6  80.0 - 100.0 fL Final  . MCH 02/11/2020 27.7  26.0 - 34.0 pg Final  . MCHC 02/11/2020 32.3  30.0 - 36.0 g/dL Final  . RDW 02/11/2020 14.1  11.5 - 15.5 % Final  . Platelets 02/11/2020 233  150 - 400 K/uL Final  . nRBC 02/11/2020 0.0  0.0 - 0.2 % Final  . Neutrophils Relative % 02/11/2020 57  % Final  . Neutro Abs 02/11/2020 3.7  1.7 - 7.7 K/uL Final  . Lymphocytes Relative 02/11/2020 35  % Final  . Lymphs Abs 02/11/2020 2.3  0.7 - 4.0 K/uL Final  . Monocytes Relative 02/11/2020 5  % Final  . Monocytes Absolute 02/11/2020 0.3  0 - 1 K/uL Final  . Eosinophils Relative 02/11/2020 2  % Final  . Eosinophils Absolute 02/11/2020 0.1  0 - 0 K/uL Final  . Basophils Relative 02/11/2020 1  % Final  . Basophils Absolute 02/11/2020 0.1  0 - 0 K/uL Final  . Immature Granulocytes 02/11/2020 0  % Final  . Abs Immature  Granulocytes 02/11/2020 0.01  0.00 - 0.07 K/uL Final   Performed at [facility], [address] [place],  [address]    Assessment:  Donna Riley is a 59 y.o. female with stage IIA left breast cancerstatus post partial mastectomy and sentinel lymph node biopsy on 08/27/2014. 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 There was lymphovascular invasion. One of 2 sentinel lymph nodes were positive (focus of 2.8 mm). Tumor was >90% ER positive, >90% PR positive, and Her2/neu negative. Pathologic stage was [id number].  Bone scanon 09/16/2014 revealed abnormal focal uptake at the level of right L3 pedicle and L5 vertebral body. Lumbar spine MRI  on 09/27/2014 revealed no evidence of metastatic disease with lower lumbar facet arthritis.Abdominal and pelvic CT scanon 09/24/2014 revealed hepatomegaly and no evidence of metastatic disease.   She received 4 cycles of Taxotere and Cytoxan(09/29/2014 - 12/09/2014) with Neulasta support. She received 50.4 Gyto the left breast from 01/12/2015 until 02/23/2015. She was started on Femaraon 03/12/2015, but switched to [place] 03/30/2015 secondary to diffuse joint aches.   BCI testing on 08/20/2019 revealed an estimated risk of late recurrence between years 5-10 of 10.1% (95% CI: 5.6-14.3%).  She is not likely to benefit from extended endocrine therapy.  CA27.29has been followed: 16.9 on 09/09/2014, 12.1 on 06/02/2015, 15.6 on 08/07/2015, 17.4 on 11/23/2015, 13.6 on 02/22/2016, 18.9 on 06/20/2016, 18.4 on 10/21/2016, 15 on 02/23/2017, 14.1 on 06/26/2017, 15.6 on 12/25/2017, 12.4 on 08/08/2018, 8.8 on 02/08/2019, 19.2 on 08/08/2019, and 18 on 02/11/2020.  Bilateral diagnostic mammogramon 07/30/2015 revealed no evidence of malignancy. Left sided mammogram and ultrasound on 02/29/2016 revealed a 1.5 x 0.6 x 0.7 cm intraductal mass. Bilateral mammogramon 09/12/2016 revealed no focal fluid collection at the surgical site to account for the persistent drainage. There were no suspicious mammographic or targeted sonographic abnormalities at the lumpectomy site. There was no evidence of malignancy in the bilateral breasts.   Bilateral mammogram on 09/14/2017 that revealed maturing fat necrosis at the LEFT lumpectomy site. There was no evidence of malignancy in either breast. Left diagnostic mammogram and ultrasound on 01/01/2018 revealed no significant abnormality other than a single normal appearing lymph node over the axillary tail portion of the left breast in the area of palpable abnormality and tenderness.  Bilateral diagnostic mammogram on 09/17/2018 revealed no evidence of malignancy in  either breast. There were expected post-lumpectomy changes involving the left breast. Diagnostic bilateral mammogram on 09/19/2019 revealed stable postsurgical changes in the left breast. There was no evidence of malignancy.  She had chronic left nipple discharge. Left breast lumpectomyat the 11 o'clock position on 04/05/2016 revealed an intraductal papillomawith sclerosis and calcifications. There was fat necrosis with fibrosis and calcifications. Pathology was negative for atypia and malignancy  Colonoscopyon 08/28/2015 revealed one 4 mm polyp in the rectum (tubular adenoma). EGD on 10/03/2019 revealed erythematous mucosa in the antrum. Pathology revealed antral and oxyntic mucosa with moderate chronic inactive gastritis. H. Pylori was negative. There was no intestinal metaplasia, dysplasia, and malignancy.   Bone densitystudy on 09/15/2014 revealed osteopeniawith a T-score of -2.1 at L1-L4. Bone densityon 01/17/2018 revealed osteoporosiswith a T-score of -2.6 in the AP spine L1-L4. She is on calcium and vitamin D.  She has a smoking history.  Low dose chest CT on 02/05/2020 revealed Lung-RADS 2S, benign appearance or behavior.  There was aortic atherosclerosis, in addition to left anterior descending coronary artery disease. There was mild  diffuse bronchial wall thickening with mild centrilobular and paraseptal emphysema suggestive of underlying COPD.  Symptomatically, she is doing well.  Exam is stable.  Plan: 1.   Labs today: CBC with diff, CMP, CA27.29 2.   Stage IIALEFTbreast cancer Clinically, he is doing well. Bilateral diagnostic mammogram on 09/19/2019 revealed no evidence of malignancy. BCI testing from 08/20/2019 was personally reviewed.  A copy was provided to the patient.  Estimated risk of late recurrence between years 5-10 is 10.1% (95% CI: 5.6-14.3%).   She is not likely to benefit from extended endocrine therapy She began endocrine therapy on 03/12/2015.   Five  years of adjuvant endocrine therapy will end around mid 02/2020. Continue Arimidex until 03/13/2020. 3.   Osteoporosis             She is on calcium and vitamin D.  Bone density on 02/14/2020. 4.   H pylori gastritis             Patient s/p EGD on 10/03/2019.  H. pylori was negative. 5.   Smoking history  She has a 33 pack year smoking history.  She is currently smoking 6 cigarettes a day.  Review interval low-dose chest CT.   Anticipate follow-up imaging in 1 year  Continue to encourage smoking cessation. 6.   Bone density on 02/14/2020. 7.   Bilateral mammogram on 09/19/2019. 8.   RTC in 6 months for MD assessment and labs (CBC with diff, CMP, CA27.29).  I discussed the assessment and treatment plan with the patient.  The patient was provided an opportunity to ask questions and all were answered.  The patient agreed with the plan and demonstrated an understanding of the instructions.  The patient was advised to call back if the symptoms worsen or if the condition fails to improve as anticipated.  I provided 13 minutes of face-to-face time during this this encounter and > 50% was spent counseling as documented under my assessment and plan.  An additional 10 minutes were spent reviewing her chart (Epic and Care Everywhere) including notes, labs, and imaging studies.    Lequita Asal, MD, PhD    02/11/2020, 10:07 AM  I, Mirian Mo Tufford, am acting as Education administrator for [employer]. Mike Gip, MD, PhD.  I, Melissa C. Mike Gip, MD, have reviewed the above documentation for accuracy and completeness, and I agree with the above.

## 2020-02-11 ENCOUNTER — Encounter: Payer: Self-pay | Admitting: Hematology and Oncology

## 2020-02-11 ENCOUNTER — Inpatient Hospital Stay: Payer: Medicare Other | Attending: Oncology

## 2020-02-11 ENCOUNTER — Ambulatory Visit: Payer: Medicare Other | Admitting: Hematology and Oncology

## 2020-02-11 ENCOUNTER — Other Ambulatory Visit: Payer: Self-pay

## 2020-02-11 ENCOUNTER — Inpatient Hospital Stay (HOSPITAL_BASED_OUTPATIENT_CLINIC_OR_DEPARTMENT_OTHER): Payer: Medicare Other | Admitting: Hematology and Oncology

## 2020-02-11 ENCOUNTER — Other Ambulatory Visit: Payer: Medicare Other

## 2020-02-11 VITALS — BP 146/84 | HR 75 | Temp 97.7°F | Resp 18 | Ht 67.0 in | Wt 200.4 lb

## 2020-02-11 DIAGNOSIS — Z170 Estrogen receptor positive status [ER+]: Secondary | ICD-10-CM | POA: Diagnosis not present

## 2020-02-11 DIAGNOSIS — G8929 Other chronic pain: Secondary | ICD-10-CM | POA: Insufficient documentation

## 2020-02-11 DIAGNOSIS — Z87891 Personal history of nicotine dependence: Secondary | ICD-10-CM

## 2020-02-11 DIAGNOSIS — I2510 Atherosclerotic heart disease of native coronary artery without angina pectoris: Secondary | ICD-10-CM | POA: Diagnosis not present

## 2020-02-11 DIAGNOSIS — I10 Essential (primary) hypertension: Secondary | ICD-10-CM | POA: Diagnosis not present

## 2020-02-11 DIAGNOSIS — Z7984 Long term (current) use of oral hypoglycemic drugs: Secondary | ICD-10-CM | POA: Diagnosis not present

## 2020-02-11 DIAGNOSIS — C50912 Malignant neoplasm of unspecified site of left female breast: Secondary | ICD-10-CM

## 2020-02-11 DIAGNOSIS — F17210 Nicotine dependence, cigarettes, uncomplicated: Secondary | ICD-10-CM | POA: Diagnosis not present

## 2020-02-11 DIAGNOSIS — M25519 Pain in unspecified shoulder: Secondary | ICD-10-CM | POA: Insufficient documentation

## 2020-02-11 DIAGNOSIS — M810 Age-related osteoporosis without current pathological fracture: Secondary | ICD-10-CM

## 2020-02-11 DIAGNOSIS — Z923 Personal history of irradiation: Secondary | ICD-10-CM | POA: Insufficient documentation

## 2020-02-11 DIAGNOSIS — Z79811 Long term (current) use of aromatase inhibitors: Secondary | ICD-10-CM | POA: Diagnosis not present

## 2020-02-11 DIAGNOSIS — K2970 Gastritis, unspecified, without bleeding: Secondary | ICD-10-CM | POA: Diagnosis not present

## 2020-02-11 DIAGNOSIS — M545 Low back pain: Secondary | ICD-10-CM | POA: Diagnosis not present

## 2020-02-11 DIAGNOSIS — Z8719 Personal history of other diseases of the digestive system: Secondary | ICD-10-CM | POA: Insufficient documentation

## 2020-02-11 DIAGNOSIS — E119 Type 2 diabetes mellitus without complications: Secondary | ICD-10-CM | POA: Diagnosis not present

## 2020-02-11 DIAGNOSIS — Z79899 Other long term (current) drug therapy: Secondary | ICD-10-CM | POA: Insufficient documentation

## 2020-02-11 DIAGNOSIS — B9681 Helicobacter pylori [H. pylori] as the cause of diseases classified elsewhere: Secondary | ICD-10-CM

## 2020-02-11 LAB — CBC WITH DIFFERENTIAL/PLATELET
Abs Immature Granulocytes: 0.01 10*3/uL (ref 0.00–0.07)
Basophils Absolute: 0.1 10*3/uL (ref 0.0–0.1)
Basophils Relative: 1 %
Eosinophils Absolute: 0.1 10*3/uL (ref 0.0–0.5)
Eosinophils Relative: 2 %
HCT: 43.3 % (ref 36.0–46.0)
Hemoglobin: 14 g/dL (ref 12.0–15.0)
Immature Granulocytes: 0 %
Lymphocytes Relative: 35 %
Lymphs Abs: 2.3 10*3/uL (ref 0.7–4.0)
MCH: 27.7 pg (ref 26.0–34.0)
MCHC: 32.3 g/dL (ref 30.0–36.0)
MCV: 85.6 fL (ref 80.0–100.0)
Monocytes Absolute: 0.3 10*3/uL (ref 0.1–1.0)
Monocytes Relative: 5 %
Neutro Abs: 3.7 10*3/uL (ref 1.7–7.7)
Neutrophils Relative %: 57 %
Platelets: 233 10*3/uL (ref 150–400)
RBC: 5.06 MIL/uL (ref 3.87–5.11)
RDW: 14.1 % (ref 11.5–15.5)
WBC: 6.6 10*3/uL (ref 4.0–10.5)
nRBC: 0 % (ref 0.0–0.2)

## 2020-02-11 LAB — COMPREHENSIVE METABOLIC PANEL
ALT: 33 U/L (ref 0–44)
AST: 23 U/L (ref 15–41)
Albumin: 4.3 g/dL (ref 3.5–5.0)
Alkaline Phosphatase: 83 U/L (ref 38–126)
Anion gap: 11 (ref 5–15)
BUN: 14 mg/dL (ref 6–20)
CO2: 24 mmol/L (ref 22–32)
Calcium: 9 mg/dL (ref 8.9–10.3)
Chloride: 104 mmol/L (ref 98–111)
Creatinine, Ser: 0.82 mg/dL (ref 0.44–1.00)
GFR calc Af Amer: >60 mL/min (ref >60)
GFR calc non Af Amer: >60 mL/min (ref >60)
Glucose, Bld: 130 mg/dL — ABNORMAL HIGH (ref 70–99)
Potassium: 3.5 mmol/L (ref 3.5–5.1)
Sodium: 139 mmol/L (ref 135–145)
Total Bilirubin: 0.4 mg/dL (ref 0.3–1.2)
Total Protein: 7.6 g/dL (ref 6.5–8.1)

## 2020-02-11 NOTE — Progress Notes (Signed)
No new changes noted today 

## 2020-02-12 LAB — CANCER ANTIGEN 27.29: CA 27.29: 18 U/mL (ref 0.0–38.6)

## 2020-03-03 ENCOUNTER — Ambulatory Visit
Admission: RE | Admit: 2020-03-03 | Discharge: 2020-03-03 | Disposition: A | Discharge status: Home / Self Care | Payer: Medicare Other | Source: Ambulatory Visit | Attending: Hematology and Oncology | Admitting: Hematology and Oncology

## 2020-03-03 ENCOUNTER — Other Ambulatory Visit: Payer: Self-pay

## 2020-03-03 DIAGNOSIS — M810 Age-related osteoporosis without current pathological fracture: Secondary | ICD-10-CM | POA: Insufficient documentation

## 2020-03-31 ENCOUNTER — Other Ambulatory Visit: Payer: Self-pay | Admitting: Internal Medicine

## 2020-03-31 DIAGNOSIS — I10 Essential (primary) hypertension: Secondary | ICD-10-CM

## 2020-04-01 ENCOUNTER — Encounter: Payer: Self-pay | Admitting: Hematology and Oncology

## 2020-06-10 DIAGNOSIS — Z139 Encounter for screening, unspecified: Secondary | ICD-10-CM

## 2020-06-10 LAB — GLUCOSE, POCT (MANUAL RESULT ENTRY): POC Glucose: 104 mg/dl — AB (ref 70–99)

## 2020-06-10 NOTE — Congregational Nurse Program (Signed)
  Dept: [phone]   Congregational Nurse Program Note  Date of Encounter: 06/10/2020  Past Medical History: Past Medical History:  Diagnosis Date  . Arthritis    shoulders and neck  . Breast cancer ([place]) 2016    Left breast with chemo + rad tx's with lumpectomy.  . Breast wound, left, sequela 06/23/2016  . Diabetes mellitus without complication ([place]) 06/252  . Elevated LFTs   . Fatigue 08/01/2016  . GERD (gastroesophageal reflux disease)   . Gout   . Hydradenitis 01/23/2015  . Hyperlipidemia   . Hypertension   . Mastitis 10/15/2016  . Personal history of chemotherapy 2016   left breast ca  . Personal history of radiation therapy 2016   LEFT lumpectomy  . Personal history of radiation therapy   . Positive PPD, treated   . Shortness of breath dyspnea    with exertion    Encounter Details:  CNP Questionnaire - 06/10/20 1400      Questionnaire   Do you give verbal consent to treat you today? Yes    Visit Setting --    Location Patient Served At [employer]    Patient Status --    Medical Provider --    Insurance Medicaid;Medicare    Intervention Assess (including screenings);Educate    Food Have food insecurities    Referrals PCP - [place]          late entry from 06/10/20 completed on 05/12/21. Client presented to food bank nurse only clinic requesting blood sugar screening. Hasn't taken prescribed Metformin today and is fasting. Blood sugar screening 104. Admits to smoking 8 cigarettes per day. Primary medical provider is Dr. Sherron Ales, [place]. Confidentiality discussed. Agrees to visit. Counseled re: importance of consistently taking prescribed meds for best management of diabetes. Client agrees. May RTC for periodic blood sugar screenings. Declines BP check today.

## 2020-06-24 ENCOUNTER — Telehealth: Payer: Self-pay | Admitting: Hematology and Oncology

## 2020-06-24 NOTE — Telephone Encounter (Signed)
  Please call patient.  Offer follow-up appointment.  M

## 2020-06-24 NOTE — Telephone Encounter (Signed)
Pt called and states that she is experiencing breast tenderness +pain + itching and is unable to lift her arms. Requests a call back from the nurse.

## 2020-06-24 NOTE — Telephone Encounter (Signed)
Appt was made and pt is aware of 1/31 appt

## 2020-06-25 NOTE — Progress Notes (Signed)
[facility]  [address] [place], [place] [address] Phone: [phone]  Fax: [phone]   Clinic Day:  06/29/2020  Referring physician: Glean Hess, MD  Chief Complaint: Donna Riley is a 60 y.o. female with stage IIA left breast cancer who is seen for 5 month assessment.    HPI: The patient was last seen in the medical oncology clinic on 02/11/2020. At that time, she was doing well.  Exam was stable. Hematocrit was 43.3, hemoglobin 14.0, platelets 233,000, WBC 6,600. Glucose was 130. CA27.29 was 18.0. She planned to finished 5 years of Arimidex in 02/2020. She continued calcium and vitamin D.  Bone density on 03/03/2020 revealed osteopenia with a T score of -2.2 at the lumbar spine.  The patient called clinic on 06/25/2020 and reported breat tenderness and itching.  Screening mammogram is scheduled for 09/21/2020.  During the interim, she has been "ok". Her breasts and nipples are sore and the range of motion in her arms is limited. She has shooting and burning pain in her breasts at night which make it difficult for her to sleep. These symptoms have slowly worsened over the past month. She denies any trauma or injury to the area. It feels like someone is sitting on her chest at times.  She denies pleuritic pain.  Her lower back pain is stable. She denies neck, arm, and jaw pain. Her gastroparesis has improved. EGD is planned for the near future.  The patient performs monthly breast self exams.   Past Medical History:  Diagnosis Date  . Arthritis    shoulders and neck  . Breast cancer ([place]) 2016    Left breast with chemo + rad tx's with lumpectomy.  . Breast wound, left, sequela 06/23/2016  . Diabetes mellitus without complication ([place]) 11/3530  . Elevated LFTs   . Fatigue 08/01/2016  . GERD (gastroesophageal reflux disease)   . Gout   . Hydradenitis 01/23/2015  . Hyperlipidemia   . Hypertension   . Mastitis 10/15/2016  . Personal  history of chemotherapy 2016   left breast ca  . Personal history of radiation therapy 2016   LEFT lumpectomy  . Personal history of radiation therapy   . Positive PPD, treated   . Shortness of breath dyspnea    with exertion    Past Surgical History:  Procedure Laterality Date  . BREAST BIOPSY Left 07/31/2014   [facility] and DCIS   . BREAST BIOPSY Left 03/08/2016   INTRADUCTAL PAPILLOMA   . 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 BREAST LUMPECTOMY Left 04/05/2016   excision of intraductal papilloma, no malignancy or atypia  . BREAST LUMPECTOMY WITH NEEDLE LOCALIZATION Left 04/05/2016   Procedure: BREAST LUMPECTOMY WITH NEEDLE LOCALIZATION;  Surgeon: Hubbard Robinson, MD;  Location: [facility] ORS;  Service: General;  Laterality: Left;  . BREAST LUMPECTOMY WITH NEEDLE LOCALIZATION AND AXILLARY SENTINEL LYMPH NODE BX Left 08/27/14  . CHOLECYSTECTOMY  09/07/2013  . COLONOSCOPY WITH PROPOFOL N/A 08/28/2015   Procedure: COLONOSCOPY WITH PROPOFOL;  Surgeon: Lucilla Lame, MD;  Location: [place];  Service: Endoscopy;  Laterality: N/A;  Diabetic oral  . ESOPHAGOGASTRODUODENOSCOPY  2014   gastritis  . ESOPHAGOGASTRODUODENOSCOPY (EGD) WITH PROPOFOL N/A 04/16/2019   Procedure: ESOPHAGOGASTRODUODENOSCOPY (EGD) WITH PROPOFOL;  Surgeon: Virgel Manifold, MD;  Location: [facility] END[facility];  Service: Endoscopy;  Laterality: N/A;  . ESOPHAGOGASTRODUODENOSCOPY (EGD) WITH PROPOFOL N/A 10/03/2019   Procedure: ESOPHAGOGASTRODUODENOSCOPY (EGD) WITH PROPOFOL;  Surgeon: Pasty Spillers, MD;  Location: [facility] END[facility];  Service: Endoscopy;  Laterality: N/A;  . POLYPECTOMY  08/28/2015   Procedure: POLYPECTOMY INTESTINAL;  Surgeon: Midge Minium, MD;  Location: [facility];  Service: Endoscopy;;  Rectal polyp  . TUBAL LIGATION Bilateral   . UMBILICAL HERNIA REPAIR N/A 08/09/2016   Procedure: HERNIA REPAIR UMBILICAL ADULT;  Surgeon: Henrene Dodge, MD;  Location: [facility] OR[facility];   Service: General;  Laterality: N/A;  . VENTRAL HERNIA REPAIR N/A 04/10/2015   Procedure: HERNIA REPAIR VENTRAL ADULT;  Surgeon: Nadeen Landau, MD;  Location: [facility];  Service: General;  Laterality: N/A;    Family History  Problem Relation Age of Onset  . Cancer Mother        Pancreatic  . Cancer Father        Throat  . Healthy Sister   . Cancer Sister   . HIV Brother   . Cancer Maternal Grandmother        Breast  . Breast cancer Maternal Grandmother 35  Her sister has liver cancer.    Social History:  reports that she has been smoking cigarettes. She has a 33.00 pack-year smoking history. She has never used smokeless tobacco. She reports current alcohol use of about 2.0 standard drinks of alcohol per week. She reports that she does not use drugs.  She has cut back on smoking to 6 cigarettes daily. She lost her husband in 05/2018.  She lives in [place]. The patient is alone today.  Allergies: No Known Allergies  Current Medications: Current Outpatient Medications  Medication Sig Dispense Refill  . acetaminophen (TYLENOL) 325 MG tablet Take 650 mg by mouth daily as needed.    . Alcohol Swabs (B-D SINGLE USE SWABS REGULAR) PADS 1 each by Does not apply route 2 (two) times daily. 100 each 3  . amLODipine (NORVASC) 10 MG tablet TAKE TWO TABLETS BY MOUTH ONCE DAILY 60 tablet 2  . aspirin EC 81 MG tablet Take 81 mg by mouth daily.    Marland Kitchen atorvastatin (LIPITOR) 10 MG tablet Take 1 tablet (10 mg total) by mouth daily. (Patient taking differently: Take 10 mg by mouth once a week.) 90 tablet 1  . Blood Glucose Calibration (TRUE METRIX LEVEL 1) Low SOLN 1 each by In Vitro route daily as needed. 1 each 3  . Blood Glucose Monitoring Suppl (TRUE METRIX METER) DEVI 1 each by Does not apply route daily. 100 Device 3  . hydrochlorothiazide (HYDRODIURIL) 25 MG tablet Take 25 mg by mouth daily.     . irbesartan (AVAPRO) 300 MG tablet Take 1 tablet (300 mg total) by mouth daily. 90 tablet 3  .  metFORMIN (GLUCOPHAGE-XR) 500 MG 24 hr tablet Take 1 tablet (500 mg total) by mouth daily with breakfast. 90 tablet 3  . anastrozole (ARIMIDEX) 1 MG tablet TAKE ONE TABLET BY MOUTH ONCE DAILY (Patient not taking: Reported on 06/29/2020) 45 tablet 0  . omeprazole (PRILOSEC) 20 MG capsule Take 1 capsule (20 mg total) by mouth 2 (two) times daily before a meal. (Patient not taking: Reported on 06/29/2020) 60 capsule 0   No current facility-administered medications for this visit.   Review of Systems  Constitutional: Negative for chills, diaphoresis, fever, malaise/fatigue and weight loss (up 9 lbs).  HENT: Negative.  Negative for congestion, ear discharge, ear pain, hearing loss, nosebleeds, sinus pain, sore throat and tinnitus.   Eyes: Negative.  Negative for blurred vision and double vision.  Respiratory: Negative.  Negative for  cough, hemoptysis, sputum production, shortness of breath and wheezing.   Cardiovascular: Negative for chest pain, palpitations, orthopnea, leg swelling and PND.       Hypertension.  At times, feels like someone is sitting on her chest.  Gastrointestinal: Negative.  Negative for abdominal pain, blood in stool, constipation, diarrhea, heartburn, melena, nausea and vomiting.  Genitourinary: Negative.  Negative for dysuria, frequency, hematuria and urgency.  Musculoskeletal: Positive for back pain (chronic lower back cramps) and joint pain (shoulder, limited ROM). Negative for falls, myalgias and neck pain.       Breast and nipple soreness and burning.  Skin: Negative.  Negative for itching and rash.  Neurological: Negative.  Negative for dizziness, tremors, sensory change, speech change, focal weakness, weakness and headaches.  Endo/Heme/Allergies: Negative.  Does not bruise/bleed easily.  Psychiatric/Behavioral: Negative.  Negative for depression and memory loss. The patient is not nervous/anxious and does not have insomnia.   All other systems reviewed and are  negative.  Performance status (ECOG): 1   Vitals Blood pressure (!) 158/64, pulse 76, temperature 97.6 F (36.4 C), temperature source Tympanic, resp. rate 16, weight 209 lb 15.8 oz (95.2 kg), SpO2 98 %.   Physical Exam Vitals and nursing note reviewed.  Constitutional:      General: She is not in acute distress.    Appearance: She is well-developed. She is not diaphoretic.  HENT:     Head: Normocephalic and atraumatic.     Comments: Shoulder length hair.    Mouth/Throat:     Mouth: Mucous membranes are moist.     Pharynx: Oropharynx is clear. No oropharyngeal exudate.  Eyes:     General: No scleral icterus.    Extraocular Movements: Extraocular movements intact.     Conjunctiva/sclera: Conjunctivae normal.     Pupils: Pupils are equal, round, and reactive to light.     Comments: Brown eyes.  Neck:     Vascular: No JVD.  Cardiovascular:     Rate and Rhythm: Normal rate and regular rhythm.     Pulses: Normal pulses.     Heart sounds: Normal heart sounds. No murmur heard.   Pulmonary:     Effort: Pulmonary effort is normal. No respiratory distress.     Breath sounds: Normal breath sounds. No wheezing, rhonchi or rales.  Chest:  Breasts:     Right: Skin change (dense area of apparent fibrocystic changes at 11 o'clock, 4-5 cm from the nipple), tenderness and axillary adenopathy (ridge like area) present. No inverted nipple, mass, nipple discharge or supraclavicular adenopathy.     Left: Skin change (scarring in superior quadrant; tender incision at 9 o clock) and tenderness present. No inverted nipple, mass, nipple discharge, axillary adenopathy or supraclavicular adenopathy.    Abdominal:     General: Bowel sounds are normal. There is no distension.     Palpations: Abdomen is soft. There is no mass.     Tenderness: There is no abdominal tenderness. There is no guarding or rebound.  Musculoskeletal:        General: Tenderness (right axilla, shoulders, sternum) present.      Cervical back: Normal range of motion and neck supple.     Right lower leg: No edema.     Left lower leg: No edema.  Lymphadenopathy:     Cervical: No cervical adenopathy.     Upper Body:     Right upper body: Axillary adenopathy (ridge like area) present. No supraclavicular adenopathy.     Left upper body:  No supraclavicular or axillary adenopathy.     Lower Body: No right inguinal adenopathy. No left inguinal adenopathy.  Skin:    General: Skin is warm and dry.     Coloration: Skin is not pale.     Findings: No erythema or rash.  Neurological:     Mental Status: She is alert and oriented to person, place, and time.     Deep Tendon Reflexes: Reflexes are normal and symmetric.  Psychiatric:        Behavior: Behavior normal.        Thought Content: Thought content normal.        Judgment: Judgment normal.    No visits with results within 3 Day(s) from this visit.  Latest known visit with results is:  [facility] on 06/10/2020  Component Date Value Ref Range Status  . POC Glucose 06/10/2020 104* 70 - 99 mg/dl Final    Assessment:  RUBYANN LINGLE is a 60 y.o. female with stage IIA left breast cancerstatus post partial mastectomy and sentinel lymph node biopsy on 08/27/2014. 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 There was lymphovascular invasion. One of 2 sentinel lymph nodes were positive (focus of 2.8 mm). Tumor was >90% ER positive, >90% PR positive, and Her2/neu negative. Pathologic stage was [id number].  Bone scanon 09/16/2014 revealed abnormal focal uptake at the level of right L3 pedicle and L5 vertebral body. Lumbar spine MRI on 09/27/2014 revealed no evidence of metastatic disease with lower lumbar facet arthritis.Abdominal and pelvic CT scanon 09/24/2014 revealed hepatomegaly and no evidence of metastatic disease.   She received 4 cycles of Taxotere and  Cytoxan(09/29/2014 - 12/09/2014) with Neulasta support. She received 50.4 Gyto the left breast from 01/12/2015 until 02/23/2015. She was started on Femaraon 03/12/2015, but switched to [place] 03/30/2015 secondary to diffuse joint aches. Arimidex completed around 03/13/2020.  BCI testing on 08/20/2019 revealed an estimated risk of late recurrence between years 5-10 of 10.1% (95% CI: 5.6-14.3%).  She is not likely to benefit from extended endocrine therapy.  CA27.29has been followed: 16.9 on 09/09/2014, 12.1 on 06/02/2015, 15.6 on 08/07/2015, 17.4 on 11/23/2015, 13.6 on 02/22/2016, 18.9 on 06/20/2016, 18.4 on 10/21/2016, 15 on 02/23/2017, 14.1 on 06/26/2017, 15.6 on 12/25/2017, 12.4 on 08/08/2018, 8.8 on 02/08/2019, 19.2 on 08/08/2019, and 18 on 02/11/2020.  Bilateral diagnostic mammogramon 07/30/2015 revealed no evidence of malignancy. Left sided mammogram and ultrasound on 02/29/2016 revealed a 1.5 x 0.6 x 0.7 cm intraductal mass. Bilateral mammogramon 09/12/2016 revealed no focal fluid collection at the surgical site to account for the persistent drainage. There were no suspicious mammographic or targeted sonographic abnormalities at the lumpectomy site. There was no evidence of malignancy in the bilateral breasts.   Bilateral mammogram on 09/14/2017 that revealed maturing fat necrosis at the LEFT lumpectomy site. There was no evidence of malignancy in either breast. Left diagnostic mammogram and ultrasound on 01/01/2018 revealed no significant abnormality other than a single normal appearing lymph node over the axillary tail portion of the left breast in the area of palpable abnormality and tenderness.  Bilateral diagnostic mammogram on 09/17/2018 revealed no evidence of malignancy in either breast. There were expected post-lumpectomy changes involving the left breast. Diagnostic bilateral mammogram on 09/19/2019 revealed stable postsurgical changes in the left breast. There was no  evidence of malignancy.  She had chronic left nipple discharge. Left breast lumpectomyat the 11 o'clock position on 04/05/2016 revealed an intraductal papillomawith  sclerosis and calcifications. There was fat necrosis with fibrosis and calcifications. Pathology was negative for atypia and malignancy  Colonoscopyon 08/28/2015 revealed one 4 mm polyp in the rectum (tubular adenoma). EGD on 10/03/2019 revealed erythematous mucosa in the antrum. Pathology revealed antral and oxyntic mucosa with moderate chronic inactive gastritis. H. Pylori was negative. There was no intestinal metaplasia, dysplasia, and malignancy.   Bone densitystudy on 09/15/2014 revealed osteopeniawith a T-score of -2.1 at L1-L4. Bone densityon 01/17/2018 revealed osteoporosiswith a T-score of -2.6 in the AP spine L1-L4. Bone density on 03/03/2020 revealed osteopenia with a T score of -2.2 at the lumbar spine. She is on calcium and vitamin D.   She has a smoking history.  Low dose chest CT on 02/05/2020 revealed Lung-RADS 2S, benign appearance or behavior.  There was aortic atherosclerosis, in addition to left anterior descending coronary artery disease. There was mild diffuse bronchial wall thickening with mild centrilobular and paraseptal emphysema suggestive of underlying COPD.  Symptomatically, she notes a 1 month history of breast pain (right > left).  Pain is often shooting and burning at night which make it difficult for her to sleep. She denies any trauma or injury to the area.  Exam reveals fibrocystic changes at 11o'clock and a ridge like area in her right lateral breast.  Plan: 1.   Stage IIALEFTbreast cancer Clinically, she is doing fair.  She has unexplained breast pain (predominantly right side). Bilateral diagnostic mammogram on 09/19/2019 revealed no evidence of malignancy. BCI testing from 08/20/2019 revealed no likely to benefit from extended endocrine therapy Completed 5 years of endocrine therapy  on 03/13/2020.   Discuss plan for bilateral diagnostic mammogram and ultrasound. If etiology is unclear for unexplained breast pain, will likely pursue breast MRI or chest CT. 2.   Osteoporosis             Bone density on 03/03/2020 revealed osteopenia with a T score of -2.2.  She is on calcium and vitamin D. 3.   H pylori gastritis             Patient s/p EGD on 10/03/2019.  H. pylori was negative.  She states an EGD is planned for the future (unknown date). 4.   Smoking history  She has a 33 pack year smoking history.  Low-dose chest CT on 02/05/2020 was benign (Lung-RADS 2S).  Consider early chest CT if etiology of breast pain remains unclear. 5.   Bilateral diagnostic mammogram and ultrasound. 6.   RTC for MD assessment and review of mammogram.  I discussed the assessment and treatment plan with the patient.  The patient was provided an opportunity to ask questions and all were answered.  The patient agreed with the plan and demonstrated an understanding of the instructions.  The patient was advised to call back if the symptoms worsen or if the condition fails to improve as anticipated.   Lequita Asal, MD, PhD    06/29/2020, 9:46 AM  I, Mirian Mo Tufford, am acting as Education administrator for [employer]. Mike Gip, MD, PhD.  I, Dajana Gehrig C. Mike Gip, MD, have reviewed the above documentation for accuracy and completeness, and I agree with the above.

## 2020-06-29 ENCOUNTER — Inpatient Hospital Stay: Payer: Medicare Other | Attending: Hematology and Oncology | Admitting: Hematology and Oncology

## 2020-06-29 ENCOUNTER — Other Ambulatory Visit: Payer: Self-pay

## 2020-06-29 ENCOUNTER — Encounter: Payer: Self-pay | Admitting: Hematology and Oncology

## 2020-06-29 VITALS — BP 158/64 | HR 76 | Temp 97.6°F | Resp 16 | Wt 210.0 lb

## 2020-06-29 DIAGNOSIS — Z923 Personal history of irradiation: Secondary | ICD-10-CM | POA: Insufficient documentation

## 2020-06-29 DIAGNOSIS — Z79899 Other long term (current) drug therapy: Secondary | ICD-10-CM | POA: Diagnosis not present

## 2020-06-29 DIAGNOSIS — Z9221 Personal history of antineoplastic chemotherapy: Secondary | ICD-10-CM | POA: Insufficient documentation

## 2020-06-29 DIAGNOSIS — Z8719 Personal history of other diseases of the digestive system: Secondary | ICD-10-CM | POA: Diagnosis not present

## 2020-06-29 DIAGNOSIS — Z170 Estrogen receptor positive status [ER+]: Secondary | ICD-10-CM | POA: Insufficient documentation

## 2020-06-29 DIAGNOSIS — Z79811 Long term (current) use of aromatase inhibitors: Secondary | ICD-10-CM | POA: Insufficient documentation

## 2020-06-29 DIAGNOSIS — C50912 Malignant neoplasm of unspecified site of left female breast: Secondary | ICD-10-CM | POA: Diagnosis not present

## 2020-06-29 DIAGNOSIS — M8588 Other specified disorders of bone density and structure, other site: Secondary | ICD-10-CM

## 2020-06-29 DIAGNOSIS — N644 Mastodynia: Secondary | ICD-10-CM | POA: Diagnosis not present

## 2020-06-30 ENCOUNTER — Other Ambulatory Visit: Payer: Self-pay | Admitting: Internal Medicine

## 2020-06-30 DIAGNOSIS — I10 Essential (primary) hypertension: Secondary | ICD-10-CM

## 2020-07-01 ENCOUNTER — Ambulatory Visit
Admission: RE | Admit: 2020-07-01 | Discharge: 2020-07-01 | Disposition: A | Discharge status: Home / Self Care | Payer: Medicare Other | Source: Ambulatory Visit | Attending: Hematology and Oncology | Admitting: Hematology and Oncology

## 2020-07-01 ENCOUNTER — Other Ambulatory Visit: Payer: Self-pay

## 2020-07-01 DIAGNOSIS — N644 Mastodynia: Secondary | ICD-10-CM | POA: Insufficient documentation

## 2020-07-01 DIAGNOSIS — C50912 Malignant neoplasm of unspecified site of left female breast: Secondary | ICD-10-CM

## 2020-07-01 DIAGNOSIS — Z853 Personal history of malignant neoplasm of breast: Secondary | ICD-10-CM | POA: Diagnosis not present

## 2020-07-01 DIAGNOSIS — Z170 Estrogen receptor positive status [ER+]: Secondary | ICD-10-CM

## 2020-07-01 DIAGNOSIS — N6311 Unspecified lump in the right breast, upper outer quadrant: Secondary | ICD-10-CM | POA: Diagnosis not present

## 2020-07-01 DIAGNOSIS — R922 Inconclusive mammogram: Secondary | ICD-10-CM | POA: Diagnosis not present

## 2020-07-01 NOTE — Progress Notes (Signed)
[facility]  [address], [address] [place], [place] [address] Phone: [phone]  Fax: [phone]   Clinic Day:  07/02/2020  Referring physician: Glean Hess, MD  Chief Complaint: Donna Riley is a 60 y.o. female with stage IIA left breast cancer who is seen for review of interval mammogram and discussion regarding direction of therapy.  HPI: The patient was last seen in the medical oncology clinic on 06/29/2020. At that time, she noted breast and nipple soreness with limited range of motion in her arms.  She described shooting pains in her breasts.  Symptoms were predominantly in the right breast.  Symptoms had increased over the prior month.  She denied any trauma.  Bilateral diagnostic mammogram on 07/01/2020 revealed no evidence of malignancy in either breast. Targeted ultrasound was performed, showed normal tissue throughout the upper-outer quadrant of the right breast from 8-12 o'clock. There was no solid or cystic mass, abnormal shadowing or distortion visualized.  During the interim, she has been "alright." She is still having shooting burning breast pain (R>L). Her breasts are itchy. It hurts to move her arms; she notes that she has a cracked bone in her right arm. She does not feel like someone is sitting on her chest.  She denies any pleuritic pain. She has some soreness in her right neck that makes it difficult for her to swallow.  The patient did not have these symptoms when she had her last chest CT on 02/05/2020.  He wishes to pursue a breast MRI.  She denies any metal would preclude imaging.   Past Medical History:  Diagnosis Date  . Arthritis    shoulders and neck  . Breast cancer ([place]) 2016    Left breast with chemo + rad tx's with lumpectomy.  . Breast wound, left, sequela 06/23/2016  . Diabetes mellitus without complication ([place]) 12/6759  . Elevated LFTs   . Fatigue 08/01/2016  . GERD (gastroesophageal reflux disease)   . Gout   .  Hydradenitis 01/23/2015  . Hyperlipidemia   . Hypertension   . Mastitis 10/15/2016  . Personal history of chemotherapy 2016   left breast ca  . Personal history of radiation therapy 2016   LEFT lumpectomy  . Personal history of radiation therapy   . Positive PPD, treated   . Shortness of breath dyspnea    with exertion    Past Surgical History:  Procedure Laterality Date  . BREAST BIOPSY Left 07/31/2014   [facility] and DCIS   . BREAST BIOPSY Left 03/08/2016   INTRADUCTAL PAPILLOMA   . 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 BREAST LUMPECTOMY Left 04/05/2016   excision of intraductal papilloma, no malignancy or atypia  . BREAST LUMPECTOMY WITH NEEDLE LOCALIZATION Left 04/05/2016   Procedure: BREAST LUMPECTOMY WITH NEEDLE LOCALIZATION;  Surgeon: Hubbard Robinson, MD;  Location: [facility];  Service: General;  Laterality: Left;  . BREAST LUMPECTOMY WITH NEEDLE LOCALIZATION AND AXILLARY SENTINEL LYMPH NODE BX Left 08/27/14  . CHOLECYSTECTOMY  09/07/2013  . COLONOSCOPY WITH PROPOFOL N/A 08/28/2015   Procedure: COLONOSCOPY WITH PROPOFOL;  Surgeon: Lucilla Lame, MD;  Location: [place];  Service: Endoscopy;  Laterality: N/A;  Diabetic oral  . ESOPHAGOGASTRODUODENOSCOPY  2014   gastritis  . ESOPHAGOGASTRODUODENOSCOPY (EGD) WITH PROPOFOL N/A 04/16/2019   Procedure: ESOPHAGOGASTRODUODENOSCOPY (EGD) WITH PROPOFOL;  Surgeon: Virgel Manifold, MD;  Location: [facility];  Service: Endoscopy;  Laterality: N/A;  . ESOPHAGOGASTRODUODENOSCOPY (EGD) WITH  PROPOFOL N/A 10/03/2019   Procedure: ESOPHAGOGASTRODUODENOSCOPY (EGD) WITH PROPOFOL;  Surgeon: Virgel Manifold, MD;  Location: [facility];  Service: Endoscopy;  Laterality: N/A;  . POLYPECTOMY  08/28/2015   Procedure: POLYPECTOMY INTESTINAL;  Surgeon: Lucilla Lame, MD;  Location: [place];  Service: Endoscopy;;  Rectal polyp  . TUBAL LIGATION Bilateral   . UMBILICAL HERNIA REPAIR N/A  08/09/2016   Procedure: HERNIA REPAIR UMBILICAL ADULT;  Surgeon: Olean Ree, MD;  Location: [facility];  Service: General;  Laterality: N/A;  . VENTRAL HERNIA REPAIR N/A 04/10/2015   Procedure: HERNIA REPAIR VENTRAL ADULT;  Surgeon: Leonie Green, MD;  Location: [facility];  Service: General;  Laterality: N/A;    Family History  Problem Relation Age of Onset  . Cancer Mother        Pancreatic  . Cancer Father        Throat  . Healthy Sister   . Cancer Sister   . HIV Brother   . Cancer Maternal Grandmother        Breast  . Breast cancer Maternal Grandmother 75    Social History:  reports that she has been smoking cigarettes. She has a 33.00 pack-year smoking history. She has never used smokeless tobacco. She reports current alcohol use of about 2.0 standard drinks of alcohol per week. She reports that she does not use drugs.  She has cut back on smoking to 6 cigarettes daily. She lost her husband in 05/2018.  She lives in [place]. The patient is alone today.  Allergies: No Known Allergies  Current Medications: Current Outpatient Medications  Medication Sig Dispense Refill  . acetaminophen (TYLENOL) 325 MG tablet Take 650 mg by mouth daily as needed.    . Alcohol Swabs (B-D SINGLE USE SWABS REGULAR) PADS 1 each by Does not apply route 2 (two) times daily. 100 each 3  . amLODipine (NORVASC) 10 MG tablet TAKE TWO TABLETS BY MOUTH ONCE DAILY 60 tablet 2  . aspirin EC 81 MG tablet Take 81 mg by mouth daily.    Marland Kitchen atorvastatin (LIPITOR) 10 MG tablet Take 1 tablet (10 mg total) by mouth daily. (Patient taking differently: Take 10 mg by mouth once a week.) 90 tablet 1  . Blood Glucose Calibration (TRUE METRIX LEVEL 1) Low SOLN 1 each by In Vitro route daily as needed. 1 each 3  . Blood Glucose Monitoring Suppl (TRUE METRIX METER) DEVI 1 each by Does not apply route daily. 100 Device 3  . hydrochlorothiazide (HYDRODIURIL) 25 MG tablet Take 25 mg by mouth daily.     . irbesartan (AVAPRO) 300  MG tablet Take 1 tablet (300 mg total) by mouth daily. 90 tablet 3  . metFORMIN (GLUCOPHAGE-XR) 500 MG 24 hr tablet Take 1 tablet (500 mg total) by mouth daily with breakfast. 90 tablet 3  . omeprazole (PRILOSEC) 20 MG capsule Take 1 capsule (20 mg total) by mouth 2 (two) times daily before a meal. 60 capsule 0  . anastrozole (ARIMIDEX) 1 MG tablet TAKE ONE TABLET BY MOUTH ONCE DAILY (Patient not taking: No sig reported) 45 tablet 0   No current facility-administered medications for this visit.   Review of Systems  Constitutional: Negative for chills, diaphoresis, fever, malaise/fatigue and weight loss (up 2 lbs).       Feels "alright."  HENT: Negative.  Negative for congestion, ear discharge, ear pain, hearing loss, nosebleeds, sinus pain, sore throat and tinnitus.   Eyes: Negative.  Negative for blurred vision and double  vision.  Respiratory: Negative.  Negative for cough, hemoptysis, sputum production, shortness of breath and wheezing.   Cardiovascular: Negative for chest pain, palpitations, orthopnea, leg swelling and PND.       Hypertension.  Gastrointestinal: Negative.  Negative for abdominal pain, blood in stool, constipation, diarrhea, heartburn, melena, nausea and vomiting.  Genitourinary: Negative.  Negative for dysuria, frequency, hematuria and urgency.  Musculoskeletal: Positive for back pain (chronic lower back cramps) and joint pain (right shoulder, limited ROM). Negative for falls, myalgias and neck pain.       Breast and nipple pain (R>L). Right jaw soreness.  Skin: Positive for itching (breasts). Negative for rash.  Neurological: Negative.  Negative for dizziness, tremors, sensory change, speech change, focal weakness, weakness and headaches.  Endo/Heme/Allergies: Negative.  Does not bruise/bleed easily.  Psychiatric/Behavioral: Negative.  Negative for depression and memory loss. The patient is not nervous/anxious and does not have insomnia.   All other systems reviewed and  are negative.  Performance status (ECOG): 1   Vitals Blood pressure (!) 169/78, pulse 83, temperature 98.4 F (36.9 C), temperature source Tympanic, resp. rate 16, weight 211 lb 5 oz (95.8 kg), SpO2 97 %.   Physical Exam Vitals and nursing note reviewed.  Constitutional:      General: She is not in acute distress.    Appearance: She is well-developed. She is not diaphoretic.  HENT:     Head:     Comments: Shoulder length hair. Eyes:     General: No scleral icterus.    Conjunctiva/sclera: Conjunctivae normal.     Comments: Brown eyes.  Neurological:     Mental Status: She is alert and oriented to person, place, and time.  Psychiatric:        Behavior: Behavior normal.        Thought Content: Thought content normal.        Judgment: Judgment normal.    No visits with results within 3 Day(s) from this visit.  Latest known visit with results is:  [facility] on 06/10/2020  Component Date Value Ref Range Status  . POC Glucose 06/10/2020 104* 70 - 99 mg/dl Final    Assessment:  ALEKA TWITTY is a 60 y.o. female with stage IIA left breast cancerstatus post partial mastectomy and sentinel lymph node biopsy on 08/27/2014. 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 There was lymphovascular invasion. One of 2 sentinel lymph nodes were positive (focus of 2.8 mm). Tumor was >90% ER positive, >90% PR positive, and Her2/neu negative. Pathologic stage was [id number].  Bone scanon 09/16/2014 revealed abnormal focal uptake at the level of right L3 pedicle and L5 vertebral body. Lumbar spine MRI on 09/27/2014 revealed no evidence of metastatic disease with lower lumbar facet arthritis.Abdominal and pelvic CT scanon 09/24/2014 revealed hepatomegaly and no evidence of metastatic disease.   She received 4 cycles of Taxotere and Cytoxan(09/29/2014 - 12/09/2014) with Neulasta support. She received  50.4 Gyto the left breast from 01/12/2015 until 02/23/2015. She was started on Femaraon 03/12/2015, but switched to [place] 03/30/2015 secondary to diffuse joint aches. Arimidex completed around 03/13/2020.  BCI testing on 08/20/2019 revealed an estimated risk of late recurrence between years 5-10 of 10.1% (95% CI: 5.6-14.3%).  She is not likely to benefit from extended endocrine therapy.  CA27.29has been followed: 16.9 on 09/09/2014, 12.1 on 06/02/2015, 15.6 on 08/07/2015, 17.4 on 11/23/2015, 13.6 on 02/22/2016, 18.9 on 06/20/2016, 18.4 on 10/21/2016, 15  on 02/23/2017, 14.1 on 06/26/2017, 15.6 on 12/25/2017, 12.4 on 08/08/2018, 8.8 on 02/08/2019, 19.2 on 08/08/2019, and 18 on 02/11/2020.  Bilateral diagnostic mammogramon 07/30/2015 revealed no evidence of malignancy. Left sided mammogram and ultrasound on 02/29/2016 revealed a 1.5 x 0.6 x 0.7 cm intraductal mass. Bilateral mammogramon 09/12/2016 revealed no focal fluid collection at the surgical site to account for the persistent drainage. There were no suspicious mammographic or targeted sonographic abnormalities at the lumpectomy site. There was no evidence of malignancy in the bilateral breasts.   Bilateral mammogram on 09/14/2017 that revealed maturing fat necrosis at the LEFT lumpectomy site. There was no evidence of malignancy in either breast. Left diagnostic mammogram and ultrasound on 01/01/2018 revealed no significant abnormality other than a single normal appearing lymph node over the axillary tail portion of the left breast in the area of palpable abnormality and tenderness.  Bilateral diagnostic mammogram on 09/17/2018 revealed no evidence of malignancy in either breast. There were expected post-lumpectomy changes involving the left breast. Diagnostic bilateral mammogram on 09/19/2019 revealed stable postsurgical changes in the left breast. There was no evidence of malignancy.  Bilateral diagnostic mammogram on 07/01/2020  revealed no evidence of malignancy in either breast.  Targeted ultrasound on 07/01/2020 showed normal tissue throughoutthe upper-outer quadrant of the right breast from 8-12 o'clock. There was no solid or cystic mass, abnormal shadowing or distortion visualized.  She had chronic left nipple discharge. Left breast lumpectomyat the 11 o'clock position on 04/05/2016 revealed an intraductal papillomawith sclerosis and calcifications. There was fat necrosis with fibrosis and calcifications. Pathology was negative for atypia and malignancy  Colonoscopyon 08/28/2015 revealed one 4 mm polyp in the rectum (tubular adenoma). EGD on 10/03/2019 revealed erythematous mucosa in the antrum. Pathology revealed antral and oxyntic mucosa with moderate chronic inactive gastritis. H. Pylori was negative. There was no intestinal metaplasia, dysplasia, and malignancy.   Bone densitystudy on 09/15/2014 revealed osteopeniawith a T-score of -2.1 at L1-L4. Bone densityon 01/17/2018 revealed osteoporosiswith a T-score of -2.6 in the AP spine L1-L4. Bone density on 03/03/2020 revealed osteopenia with a T score of -2.2 at the lumbar spine. She is on calcium and vitamin D.   She has a smoking history.  Low dose chest CT on 02/05/2020 revealed Lung-RADS 2S, benign appearance or behavior.  There was aortic atherosclerosis, in addition to left anterior descending coronary artery disease. There was mild diffuse bronchial wall thickening with mild centrilobular and paraseptal emphysema suggestive of underlying COPD.  Symptomatically, she continues to experience shooting/burning breast pain (R>L). Her breasts are itchy. It hurts to move her arms.  She denies any pleuritic pain.  She denies any radiation of pain into her left jaw and down her arm.  She has some soreness in her right neck that makes it difficult for her to swallow.  Plan: 1.   Stage IIALEFTbreast cancer Clinically, she is doing fair.  She notes ongoing  breast pain. Bilateral diagnostic mammogram on 09/19/2019 revealed no evidence of malignancy. Bilateral diagnostic mammogram and targeted ultrasound on 07/01/2020 revealed no evidence of mass or suspicious lesions. He completed 5 years of endocrine therapy on 03/13/2020.   BCI testing on 08/20/2019 revealed no benefit from extended endocrine therapy As patient continues to have significant pain without explanation, we discussed plans for additional imaging.  Pain appears localized to the breast.  Discussed breast MRI. We may need to pursue a chest CT to ensure no evidence of disease involving the chest wall. 2.   Osteopenia  Bone density on 03/03/2020 revealed osteopenia with a T-score of -2.2 (improved).  Continue calcium and vitamin D. 3.   H pylori gastritis             Patient s/p EGD on 10/03/2019.  H. pylori was negative.  An EGD is planned for the future (unknown date). 4.   Smoking history  She has a 33 pack year smoking history.  Low-dose chest CT on 02/05/2020 revealed no evidence of malignancy.  Anticipate follow-up low-dose chest CT in 01/2021 unless imaging pursued secondary to current symptoms 5.   Coronary artery disease  Low-dose chest CT on 02/05/2020 revealed aortic atherosclerosis and left anterior descending coronary artery disease.   Significance of this is unclear.  Anticipate follow-up with Dr. Army Melia.  Current symptoms do not appear cardiac in nature.  If she has any concerning symptoms, she is to pursue immediate medical attention. 6.   Bilateral breast MRI on 07/09/2020. 7.   RTC after breast MRI (telephone or video) or MD assessment and review of imaging.  I discussed the assessment and treatment plan with the patient.  The patient was provided an opportunity to ask questions and all were answered.  The patient agreed with the plan and demonstrated an understanding of the instructions.  The patient was advised to call back if the symptoms worsen or  if the condition fails to improve as anticipated.   Lequita Asal, MD, PhD    07/02/2020, 11:25 AM  I, Mirian Mo Tufford, am acting as Education administrator for [employer]. Mike Gip, MD, PhD.  I, Charon Akamine C. Mike Gip, MD, have reviewed the above documentation for accuracy and completeness, and I agree with the above.

## 2020-07-02 ENCOUNTER — Telehealth: Payer: Self-pay | Admitting: Hematology and Oncology

## 2020-07-02 ENCOUNTER — Telehealth: Payer: Self-pay | Admitting: *Deleted

## 2020-07-02 ENCOUNTER — Inpatient Hospital Stay: Payer: Medicare Other | Attending: Hematology and Oncology | Admitting: Hematology and Oncology

## 2020-07-02 ENCOUNTER — Encounter: Payer: Self-pay | Admitting: Hematology and Oncology

## 2020-07-02 VITALS — BP 169/78 | HR 83 | Temp 98.4°F | Resp 16 | Wt 211.3 lb

## 2020-07-02 DIAGNOSIS — I10 Essential (primary) hypertension: Secondary | ICD-10-CM | POA: Diagnosis not present

## 2020-07-02 DIAGNOSIS — Z170 Estrogen receptor positive status [ER+]: Secondary | ICD-10-CM | POA: Diagnosis not present

## 2020-07-02 DIAGNOSIS — I2510 Atherosclerotic heart disease of native coronary artery without angina pectoris: Secondary | ICD-10-CM | POA: Diagnosis not present

## 2020-07-02 DIAGNOSIS — Z79811 Long term (current) use of aromatase inhibitors: Secondary | ICD-10-CM | POA: Diagnosis not present

## 2020-07-02 DIAGNOSIS — Z7982 Long term (current) use of aspirin: Secondary | ICD-10-CM | POA: Diagnosis not present

## 2020-07-02 DIAGNOSIS — Z79899 Other long term (current) drug therapy: Secondary | ICD-10-CM | POA: Insufficient documentation

## 2020-07-02 DIAGNOSIS — F17210 Nicotine dependence, cigarettes, uncomplicated: Secondary | ICD-10-CM | POA: Insufficient documentation

## 2020-07-02 DIAGNOSIS — Z800 Family history of malignant neoplasm of digestive organs: Secondary | ICD-10-CM | POA: Insufficient documentation

## 2020-07-02 DIAGNOSIS — E119 Type 2 diabetes mellitus without complications: Secondary | ICD-10-CM | POA: Insufficient documentation

## 2020-07-02 DIAGNOSIS — E785 Hyperlipidemia, unspecified: Secondary | ICD-10-CM | POA: Insufficient documentation

## 2020-07-02 DIAGNOSIS — M8580 Other specified disorders of bone density and structure, unspecified site: Secondary | ICD-10-CM | POA: Insufficient documentation

## 2020-07-02 DIAGNOSIS — M810 Age-related osteoporosis without current pathological fracture: Secondary | ICD-10-CM

## 2020-07-02 DIAGNOSIS — C50912 Malignant neoplasm of unspecified site of left female breast: Secondary | ICD-10-CM | POA: Diagnosis not present

## 2020-07-02 DIAGNOSIS — Z7984 Long term (current) use of oral hypoglycemic drugs: Secondary | ICD-10-CM | POA: Diagnosis not present

## 2020-07-02 DIAGNOSIS — Z923 Personal history of irradiation: Secondary | ICD-10-CM | POA: Diagnosis not present

## 2020-07-02 DIAGNOSIS — Z801 Family history of malignant neoplasm of trachea, bronchus and lung: Secondary | ICD-10-CM | POA: Diagnosis not present

## 2020-07-02 DIAGNOSIS — N644 Mastodynia: Secondary | ICD-10-CM | POA: Diagnosis not present

## 2020-07-02 DIAGNOSIS — Z803 Family history of malignant neoplasm of breast: Secondary | ICD-10-CM | POA: Diagnosis not present

## 2020-07-02 DIAGNOSIS — Z87891 Personal history of nicotine dependence: Secondary | ICD-10-CM

## 2020-07-02 NOTE — Telephone Encounter (Signed)
Donna Riley from [facility] tried to return twocallsregarding this patient. Caller did not leave last name first name was Donna Riley and [place]. There is not phone note in chart. Please have the appropriate person contact her.

## 2020-07-02 NOTE — Telephone Encounter (Signed)
  Please call tomorrow.  Thanks,  M

## 2020-07-02 NOTE — Progress Notes (Signed)
Patient states she is still having pain in her right breast.

## 2020-07-02 NOTE — Telephone Encounter (Signed)
Left message with Donna Riley to schedule pt's breast MRI.

## 2020-07-03 DIAGNOSIS — N644 Mastodynia: Secondary | ICD-10-CM | POA: Insufficient documentation

## 2020-07-06 ENCOUNTER — Telehealth: Payer: Self-pay | Admitting: Hematology and Oncology

## 2020-07-06 NOTE — Telephone Encounter (Signed)
07/06/2020 Left VM for pt with info on MD f/u appt to go over results from Breast MRI. Virtual appt scheduled for 07/20/20 @ 2:30. Gave pt instructions on how to do virtual appt, and left call back number in case there are any questions  SRW

## 2020-07-09 ENCOUNTER — Other Ambulatory Visit: Payer: Self-pay | Admitting: Internal Medicine

## 2020-07-09 DIAGNOSIS — E118 Type 2 diabetes mellitus with unspecified complications: Secondary | ICD-10-CM | POA: Diagnosis not present

## 2020-07-09 DIAGNOSIS — E119 Type 2 diabetes mellitus without complications: Secondary | ICD-10-CM | POA: Diagnosis not present

## 2020-07-09 DIAGNOSIS — B353 Tinea pedis: Secondary | ICD-10-CM | POA: Diagnosis not present

## 2020-07-09 DIAGNOSIS — I10 Essential (primary) hypertension: Secondary | ICD-10-CM

## 2020-07-10 ENCOUNTER — Other Ambulatory Visit: Payer: Self-pay | Admitting: Internal Medicine

## 2020-07-10 DIAGNOSIS — E118 Type 2 diabetes mellitus with unspecified complications: Secondary | ICD-10-CM

## 2020-07-13 ENCOUNTER — Ambulatory Visit (INDEPENDENT_AMBULATORY_CARE_PROVIDER_SITE_OTHER): Payer: Medicare Other

## 2020-07-13 ENCOUNTER — Other Ambulatory Visit: Payer: Self-pay

## 2020-07-13 VITALS — BP 132/82 | HR 84 | Temp 98.4°F | Resp 16 | Ht 68.0 in | Wt 210.6 lb

## 2020-07-13 DIAGNOSIS — Z0000 Encounter for general adult medical examination without abnormal findings: Secondary | ICD-10-CM

## 2020-07-13 DIAGNOSIS — Z1211 Encounter for screening for malignant neoplasm of colon: Secondary | ICD-10-CM | POA: Diagnosis not present

## 2020-07-13 NOTE — Patient Instructions (Signed)
Donna Riley , Thank you for taking time to come for your Medicare Wellness Visit. I appreciate your ongoing commitment to your health goals. Please review the following plan we discussed and let me know if I can assist you in the future.   Screening recommendations/referrals: Colonoscopy: done 08/28/15. Referral sent to [facility] today for repeat screening colonoscopy Mammogram: done 07/01/20 Bone Density: done 03/03/20 Recommended yearly ophthalmology/optometry visit for glaucoma screening and checkup Recommended yearly dental visit for hygiene and checkup  Vaccinations: Influenza vaccine: due Pneumococcal vaccine: done 01/25/17 Tdap vaccine: due  Shingles vaccine: due  Covid-19: done 10/21/19 & 11/13/19  Advanced directives: Advance directive discussed with you today. Even though you declined this today please call our office should you change your mind and we can give you the proper paperwork for you to fill out.  Conditions/risks identified: If you wish to quit smoking, help is available. For free tobacco cessation program offerings call the [facility] at 503-618-4615 or Live Well Line at 612-688-8204. You may also visit [email or web address] or email [email or web address] for more information on other programs.   Next appointment: Follow up in one year for your annual wellness visit.    Preventive Care 40-64 Years, Female Preventive care refers to lifestyle choices and visits with your health care provider that can promote health and wellness. What does preventive care include?  A yearly physical exam. This is also called an annual well check.  Dental exams once or twice a year.  Routine eye exams. Ask your health care provider how often you should have your eyes checked.  Personal lifestyle choices, including:  Daily care of your teeth and gums.  Regular physical activity.  Eating a healthy diet.  Avoiding tobacco and drug use.  Limiting  alcohol use.  Practicing safe sex.  Taking low-dose aspirin daily starting at age 22.  Taking vitamin and mineral supplements as recommended by your health care provider. What happens during an annual well check? The services and screenings done by your health care provider during your annual well check will depend on your age, overall health, lifestyle risk factors, and family history of disease. Counseling  Your health care provider may ask you questions about your:  Alcohol use.  Tobacco use.  Drug use.  Emotional well-being.  Home and relationship well-being.  Sexual activity.  Eating habits.  Work and work Statistician.  Method of birth control.  Menstrual cycle.  Pregnancy history. Screening  You may have the following tests or measurements:  Height, weight, and BMI.  Blood pressure.  Lipid and cholesterol levels. These may be checked every 5 years, or more frequently if you are over 74 years old.  Skin check.  Lung cancer screening. You may have this screening every year starting at age 71 if you have a 30-pack-year history of smoking and currently smoke or have quit within the past 15 years.  Fecal occult blood test (FOBT) of the stool. You may have this test every year starting at age 48.  Flexible sigmoidoscopy or colonoscopy. You may have a sigmoidoscopy every 5 years or a colonoscopy every 10 years starting at age 5.  Hepatitis C blood test.  Hepatitis B blood test.  Sexually transmitted disease (STD) testing.  Diabetes screening. This is done by checking your blood sugar (glucose) after you have not eaten for a while (fasting). You may have this done every 1-3 years.  Mammogram. This may be done every 1-2 years. Talk to your health  care provider about when you should start having regular mammograms. This may depend on whether you have a family history of breast cancer.  BRCA-related cancer screening. This may be done if you have a family  history of breast, ovarian, tubal, or peritoneal cancers.  Pelvic exam and Pap test. This may be done every 3 years starting at age 55. Starting at age 26, this may be done every 5 years if you have a Pap test in combination with an HPV test.  Bone density scan. This is done to screen for osteoporosis. You may have this scan if you are at high risk for osteoporosis. Discuss your test results, treatment options, and if necessary, the need for more tests with your health care provider. Vaccines  Your health care provider may recommend certain vaccines, such as:  Influenza vaccine. This is recommended every year.  Tetanus, diphtheria, and acellular pertussis (Tdap, Td) vaccine. You may need a Td booster every 10 years.  Zoster vaccine. You may need this after age 58.  Pneumococcal 13-valent conjugate (PCV13) vaccine. You may need this if you have certain conditions and were not previously vaccinated.  Pneumococcal polysaccharide (PPSV23) vaccine. You may need one or two doses if you smoke cigarettes or if you have certain conditions. Talk to your health care provider about which screenings and vaccines you need and how often you need them. This information is not intended to replace advice given to you by your health care provider. Make sure you discuss any questions you have with your health care provider. Document Released: 06/12/2015 Document Revised: 02/03/2016 Document Reviewed: 03/17/2015 Elsevier Interactive Patient Education  2017 [employer] Prevention in the Home Falls can cause injuries. They can happen to people of all ages. There are many things you can do to make your home safe and to help prevent falls. What can I do on the outside of my home?  Regularly fix the edges of walkways and driveways and fix any cracks.  Remove anything that might make you trip as you walk through a door, such as a raised step or threshold.  Trim any bushes or trees on the path to  your home.  Use bright outdoor lighting.  Clear any walking paths of anything that might make someone trip, such as rocks or tools.  Regularly check to see if handrails are loose or broken. Make sure that both sides of any steps have handrails.  Any raised decks and porches should have guardrails on the edges.  Have any leaves, snow, or ice cleared regularly.  Use sand or salt on walking paths during winter.  Clean up any spills in your garage right away. This includes oil or grease spills. What can I do in the bathroom?  Use night lights.  Install grab bars by the toilet and in the tub and shower. Do not use towel bars as grab bars.  Use non-skid mats or decals in the tub or shower.  If you need to sit down in the shower, use a plastic, non-slip stool.  Keep the floor dry. Clean up any water that spills on the floor as soon as it happens.  Remove soap buildup in the tub or shower regularly.  Attach bath mats securely with double-sided non-slip rug tape.  Do not have throw rugs and other things on the floor that can make you trip. What can I do in the bedroom?  Use night lights.  Make sure that you have a light  by your bed that is easy to reach.  Do not use any sheets or blankets that are too big for your bed. They should not hang down onto the floor.  Have a firm chair that has side arms. You can use this for support while you get dressed.  Do not have throw rugs and other things on the floor that can make you trip. What can I do in the kitchen?  Clean up any spills right away.  Avoid walking on wet floors.  Keep items that you use a lot in easy-to-reach places.  If you need to reach something above you, use a strong step stool that has a grab bar.  Keep electrical cords out of the way.  Do not use floor polish or wax that makes floors slippery. If you must use wax, use non-skid floor wax.  Do not have throw rugs and other things on the floor that can make  you trip. What can I do with my stairs?  Do not leave any items on the stairs.  Make sure that there are handrails on both sides of the stairs and use them. Fix handrails that are broken or loose. Make sure that handrails are as long as the stairways.  Check any carpeting to make sure that it is firmly attached to the stairs. Fix any carpet that is loose or worn.  Avoid having throw rugs at the top or bottom of the stairs. If you do have throw rugs, attach them to the floor with carpet tape.  Make sure that you have a light switch at the top of the stairs and the bottom of the stairs. If you do not have them, ask someone to add them for you. What else can I do to help prevent falls?  Wear shoes that:  Do not have high heels.  Have rubber bottoms.  Are comfortable and fit you well.  Are closed at the toe. Do not wear sandals.  If you use a stepladder:  Make sure that it is fully opened. Do not climb a closed stepladder.  Make sure that both sides of the stepladder are locked into place.  Ask someone to hold it for you, if possible.  Clearly mark and make sure that you can see:  Any grab bars or handrails.  First and last steps.  Where the edge of each step is.  Use tools that help you move around (mobility aids) if they are needed. These include:  Canes.  Walkers.  Scooters.  Crutches.  Turn on the lights when you go into a dark area. Replace any light bulbs as soon as they burn out.  Set up your furniture so you have a clear path. Avoid moving your furniture around.  If any of your floors are uneven, fix them.  If there are any pets around you, be aware of where they are.  Review your medicines with your doctor. Some medicines can make you feel dizzy. This can increase your chance of falling. Ask your doctor what other things that you can do to help prevent falls. This information is not intended to replace advice given to you by your health care provider.  Make sure you discuss any questions you have with your health care provider. Document Released: 03/12/2009 Document Revised: 10/22/2015 Document Reviewed: 06/20/2014 Elsevier Interactive Patient Education  2017 [employer].

## 2020-07-13 NOTE — Progress Notes (Signed)
Subjective:   Donna Riley is a 60 y.o. female who presents for Medicare Annual (Subsequent) preventive examination.  Review of Systems     Cardiac Risk Factors include: advanced age (>69men, >1 women);diabetes mellitus;dyslipidemia;hypertension;obesity (BMI >30kg/m2);smoking/ tobacco exposure     Objective:    Today's Vitals   07/13/20 0957  BP: 132/82  Pulse: 84  Resp: 16  Temp: 98.4 F (36.9 C)  TempSrc: Oral  Weight: 210 lb 9.6 oz (95.5 kg)  Height: 5\' 8"  (1.727 m)   Body mass index is 32.02 kg/m.  Advanced Directives 07/13/2020 07/02/2020 06/29/2020 02/11/2020 12/25/2019 10/03/2019 08/12/2019  Does Patient Have a Medical Advance Directive? No No No No No No No  Would patient like information on creating a medical advance directive? No - Patient declined No - Patient declined - No - Patient declined No - Patient declined No - Patient declined No - Patient declined    Current Medications (verified) Outpatient Encounter Medications as of 07/13/2020  Medication Sig  . acetaminophen (TYLENOL) 325 MG tablet Take 650 mg by mouth daily as needed.  . Alcohol Swabs (B-D SINGLE USE SWABS REGULAR) PADS 1 each by Does not apply route 2 (two) times daily.  Marland Kitchen amLODipine (NORVASC) 10 MG tablet TAKE TWO TABLETS BY MOUTH ONCE DAILY  . anastrozole (ARIMIDEX) 1 MG tablet TAKE ONE TABLET BY MOUTH ONCE DAILY  . aspirin EC 81 MG tablet Take 81 mg by mouth daily.  Marland Kitchen atorvastatin (LIPITOR) 10 MG tablet Take 1 tablet (10 mg total) by mouth daily.  . Blood Glucose Calibration (TRUE METRIX LEVEL 1) Low SOLN 1 each by In Vitro route daily as needed.  . Blood Glucose Monitoring Suppl (TRUE METRIX METER) DEVI 1 each by Does not apply route daily.  . calcium-vitamin D (OSCAL WITH D) 500-200 MG-UNIT TABS tablet Take by mouth.  . hydrochlorothiazide (HYDRODIURIL) 25 MG tablet Take 25 mg by mouth daily.   . irbesartan (AVAPRO) 300 MG tablet TAKE ONE TABLET BY MOUTH ONCE DAILY  . metFORMIN (GLUCOPHAGE-XR)  500 MG 24 hr tablet TAKE ONE TABLET BY MOUTH EVERY MORNING WITH BREAKFAST  . [DISCONTINUED] omeprazole (PRILOSEC) 20 MG capsule Take 1 capsule (20 mg total) by mouth 2 (two) times daily before a meal.   No facility-administered encounter medications on file as of 07/13/2020.    Allergies (verified) Patient has no known allergies.   History: Past Medical History:  Diagnosis Date  . Arthritis    shoulders and neck  . Breast cancer ([place]) 2016    Left breast with chemo + rad tx's with lumpectomy.  . Breast wound, left, sequela 06/23/2016  . Diabetes mellitus without complication ([place]) 0/1749  . Elevated LFTs   . Fatigue 08/01/2016  . GERD (gastroesophageal reflux disease)   . Gout   . Hydradenitis 01/23/2015  . Hyperlipidemia   . Hypertension   . Mastitis 10/15/2016  . Personal history of chemotherapy 2016   left breast ca  . Personal history of radiation therapy 2016   LEFT lumpectomy  . Personal history of radiation therapy   . Positive PPD, treated   . Shortness of breath dyspnea    with exertion   Past Surgical History:  Procedure Laterality Date  . BREAST BIOPSY Left 07/31/2014   [facility] and [facility]   . BREAST BIOPSY Left 03/08/2016   INTRADUCTAL PAPILLOMA   . 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 BREAST LUMPECTOMY Left 04/05/2016   excision of  intraductal papilloma, no malignancy or atypia  . BREAST LUMPECTOMY WITH NEEDLE LOCALIZATION Left 04/05/2016   Procedure: BREAST LUMPECTOMY WITH NEEDLE LOCALIZATION;  Surgeon: Hubbard Robinson, MD;  Location: [facility];  Service: General;  Laterality: Left;  . BREAST LUMPECTOMY WITH NEEDLE LOCALIZATION AND AXILLARY SENTINEL LYMPH NODE BX Left 08/27/14  . CHOLECYSTECTOMY  09/07/2013  . COLONOSCOPY WITH PROPOFOL N/A 08/28/2015   Procedure: COLONOSCOPY WITH PROPOFOL;  Surgeon: Lucilla Lame, MD;  Location: [place];  Service: Endoscopy;  Laterality: N/A;  Diabetic oral  . ESOPHAGOGASTRODUODENOSCOPY   2014   gastritis  . ESOPHAGOGASTRODUODENOSCOPY (EGD) WITH PROPOFOL N/A 04/16/2019   Procedure: ESOPHAGOGASTRODUODENOSCOPY (EGD) WITH PROPOFOL;  Surgeon: Virgel Manifold, MD;  Location: [facility];  Service: Endoscopy;  Laterality: N/A;  . ESOPHAGOGASTRODUODENOSCOPY (EGD) WITH PROPOFOL N/A 10/03/2019   Procedure: ESOPHAGOGASTRODUODENOSCOPY (EGD) WITH PROPOFOL;  Surgeon: Virgel Manifold, MD;  Location: [facility];  Service: Endoscopy;  Laterality: N/A;  . POLYPECTOMY  08/28/2015   Procedure: POLYPECTOMY INTESTINAL;  Surgeon: Lucilla Lame, MD;  Location: [place];  Service: Endoscopy;;  Rectal polyp  . TUBAL LIGATION Bilateral   . UMBILICAL HERNIA REPAIR N/A 08/09/2016   Procedure: HERNIA REPAIR UMBILICAL ADULT;  Surgeon: Olean Ree, MD;  Location: [facility];  Service: General;  Laterality: N/A;  . VENTRAL HERNIA REPAIR N/A 04/10/2015   Procedure: HERNIA REPAIR VENTRAL ADULT;  Surgeon: Leonie Green, MD;  Location: [facility];  Service: General;  Laterality: N/A;   Family History  Problem Relation Age of Onset  . Cancer Mother        Pancreatic  . Cancer Father        Throat  . Healthy Sister   . Cancer Sister        liver  . HIV Brother   . Cancer Maternal Grandmother        Breast  . Breast cancer Maternal Grandmother 75   Social History   Socioeconomic History  . Marital status: Widowed    Spouse name: Not on file  . Number of children: 4  . Years of education: Not on file  . Highest education level: 10th grade  Occupational History  . Occupation: Disabled  Tobacco Use  . Smoking status: Current Every Day Smoker    Packs/day: 0.50    Years: 44.00    Pack years: 22.00    Types: Cigarettes  . Smokeless tobacco: Never Used  Vaping Use  . Vaping Use: Never used  Substance and Sexual Activity  . Alcohol use: Yes    Alcohol/week: 2.0 standard drinks    Types: 2 Cans of beer per week    Comment: Occasionally .beer yesterday morning  . Drug  use: No  . Sexual activity: Not Currently  Other Topics Concern  . Not on file  Social History Narrative   Pt lives alone, son lives 2 doors down.   Social Determinants of Health   Financial Resource Strain: Low Risk   . Difficulty of Paying Living Expenses: Not very hard  Food Insecurity: No Food Insecurity  . Worried About Charity fundraiser in the Last Year: Never true  . Ran Out of Food in the Last Year: Never true  Transportation Needs: No Transportation Needs  . Lack of Transportation (Medical): No  . Lack of Transportation (Non-Medical): No  Physical Activity: Inactive  . Days of Exercise per Week: 0 days  . Minutes of Exercise per Session: 0 min  Stress: No Stress Concern Present  .  Feeling of Stress : Only a little  Social Connections: Moderately Isolated  . Frequency of Communication with Friends and Family: More than three times a week  . Frequency of Social Gatherings with Friends and Family: Three times a week  . Attends Religious Services: More than 4 times per year  . Active Member of Clubs or Organizations: No  . Attends Archivist Meetings: Never  . Marital Status: Widowed    Tobacco Counseling Ready to quit: Not Answered Counseling given: Not Answered   Clinical Intake:  Pre-visit preparation completed: Yes  Pain : No/denies pain     BMI - recorded: 32.02 Nutritional Status: BMI > 30  Obese Nutritional Risks: None Diabetes: Yes CBG done?: No Did pt. bring in CBG monitor from home?: No  How often do you need to have someone help you when you read instructions, pamphlets, or other written materials from your doctor or pharmacy?: 1 - Never  Nutrition Risk Assessment:  Has the patient had any N/V/D within the last 2 months?  No  Does the patient have any non-healing wounds?  No  Has the patient had any unintentional weight loss or weight gain?  No   Diabetes:  Is the patient diabetic?  Yes  If diabetic, was a CBG obtained today?   No  Did the patient bring in their glucometer from home?  No  How often do you monitor your CBG's? Once weekly.   Financial Strains and Diabetes Management:  Are you having any financial strains with the device, your supplies or your medication? No .  Does the patient want to be seen by Chronic Care Management for management of their diabetes?  No  Would the patient like to be referred to a Nutritionist or for Diabetic Management?  No   Diabetic Exams:  Diabetic Eye Exam: Completed 07/27/18 negative retionpathy. Overdue for diabetic eye exam. Pt has been advised about the importance in completing this exam.    Diabetic Foot Exam: Completed 02/18/19. Pt has been advised about the importance in completing this exam. Pt is scheduled for diabetic foot exam on pt to schedule appt at check out today for follow up with Dr. Army Melia. Patient was seen by podiatry on 07/09/20..    Interpreter Needed?: No  Information entered by :: Clemetine Marker LPN   Activities of Daily Living In your present state of health, do you have any difficulty performing the following activities: 07/13/2020  Hearing? N  Comment declines hearing aids  Vision? N  Difficulty concentrating or making decisions? N  Walking or climbing stairs? N  Dressing or bathing? N  Doing errands, shopping? N  Preparing Food and eating ? N  Using the Toilet? N  In the past six months, have you accidently leaked urine? N  Do you have problems with loss of bowel control? N  Managing your Medications? N  Managing your Finances? N  Housekeeping or managing your Housekeeping? N  Some recent data might be hidden    Patient Care Team: Glean Hess, MD as PCP - General (Internal Medicine) Virgel Manifold, MD as Consulting Physician (Gastroenterology) Lequita Asal, MD as Referring Physician (Hematology and Oncology) Samara Deist, DPM as Referring Physician (Podiatry)  Indicate any recent Medical Services you may have  received from other than [facility] providers in the past year (date may be approximate).     Assessment:   This is a routine wellness examination for Donna Riley.  Hearing/Vision screen  Hearing Screening  125Hz  250Hz  500Hz  1000Hz  2000Hz  3000Hz  4000Hz  6000Hz  8000Hz   Right ear:           Left ear:           Comments: Pt has no difficulty hearing   Vision Screening Comments: Annual vision screenings with Dr. Ellin Mayhew  Dietary issues and exercise activities discussed: Current Exercise Habits: The patient does not participate in regular exercise at present, Exercise limited by: orthopedic condition(s);Other - see comments (foot issues)  Goals    . Exercise 150 min/wk Moderate Activity     Recommend to exercise at least 150 minutes per week     . Quit smoking / using tobacco     If you wish to quit smoking, help is available. For free tobacco cessation program offerings call the [facility] at (610)019-1208 or Live Well Line at (407)773-9230. You may also visit [email or web address] or email [email or web address] for more information on other programs.        Depression Screen PHQ 2/9 Scores 07/13/2020 10/01/2019 08/27/2019 08/13/2019 07/10/2019 07/10/2019 06/03/2019  PHQ - 2 Score 0 0 0 0 1 0 0  PHQ- 9 Score - 0 6 4 4  - -    Fall Risk Fall Risk  07/13/2020 10/01/2019 08/27/2019 08/07/2019 07/10/2019  Falls in the past year? 0 0 0 0 0  Number falls in past yr: 0 0 0 0 0  Comment - - - - -  Injury with Fall? 0 0 0 0 0  Risk for fall due to : No Fall Risks No Fall Risks No Fall Risks - No Fall Risks  Follow up Falls prevention discussed Falls evaluation completed Falls evaluation completed - Falls prevention discussed    FALL RISK PREVENTION PERTAINING TO THE HOME:  Any stairs in or around the home? Yes  If so, are there any without handrails? No  Home free of loose throw rugs in walkways, pet beds, electrical cords, etc? Yes  Adequate lighting in your home to reduce risk of  falls? Yes   ASSISTIVE DEVICES UTILIZED TO PREVENT FALLS:  Life alert? No  Use of a cane, walker or w/c? No  Grab bars in the bathroom? Yes  Shower chair or bench in shower? No  Elevated toilet seat or a handicapped toilet? No   TIMED UP AND GO:  Was the test performed? Yes .  Length of time to ambulate 10 feet: 4 sec.   Gait steady and fast without use of assistive device  Cognitive Function: Normal cognitive status assessed by direct observation by this Nurse Health Advisor. No abnormalities found.       6CIT Screen 07/10/2019 07/09/2018 05/18/2017  What Year? 0 points 0 points 0 points  What month? 0 points 0 points 0 points  What time? 0 points 0 points 0 points  Count back from 20 0 points 0 points 0 points  Months in reverse 0 points 0 points 2 points  Repeat phrase 0 points 0 points 8 points  Total Score 0 0 10    Immunizations Immunization History  Administered Date(s) Administered  . Influenza Inj Mdck Quad With Preservative 03/12/2018  . Influenza,inj,Quad PF,6+ Mos 02/22/2016, 02/18/2019  . Influenza-Unspecified 01/30/2014, 02/25/2017, 03/12/2018  . PFIZER(Purple Top)SARS-COV-2 Vaccination 10/21/2019, 11/13/2019  . Pneumococcal Polysaccharide-23 01/25/2017    TDAP status: Due, Education has been provided regarding the importance of this vaccine. Advised may receive this vaccine at local pharmacy or Health Dept. Aware to provide a copy of the vaccination record  if obtained from local pharmacy or Health Dept. Verbalized acceptance and understanding.  Flu Vaccine status: Due, Education has been provided regarding the importance of this vaccine. Advised may receive this vaccine at local pharmacy or Health Dept. Aware to provide a copy of the vaccination record if obtained from local pharmacy or Health Dept. Verbalized acceptance and understanding.  Pneumococcal vaccine status: Up to date  Covid-19 vaccine status: Completed vaccines  Qualifies for Shingles  Vaccine? Yes   Zostavax completed No   Shingrix Completed?: No.    Education has been provided regarding the importance of this vaccine. Patient has been advised to call insurance company to determine out of pocket expense if they have not yet received this vaccine. Advised may also receive vaccine at local pharmacy or Health Dept. Verbalized acceptance and understanding.  Screening Tests Health Maintenance  Topic Date Due  . OPHTHALMOLOGY EXAM  07/28/2019  . COVID-19 Vaccine (3 - Pfizer risk 4-dose series) 12/11/2019  . FOOT EXAM  02/18/2020  . HEMOGLOBIN A1C  04/02/2020  . PAP SMEAR-Modifier  07/19/2020  . INFLUENZA VACCINE  08/27/2020 (Originally 12/29/2019)  . TETANUS/TDAP  05/30/2021 (Originally 02/26/1980)  . COLONOSCOPY (Pts 45-12yrs Insurance coverage will need to be confirmed)  08/27/2020  . MAMMOGRAM  07/01/2021  . DEXA SCAN  03/03/2030  . PNEUMOCOCCAL POLYSACCHARIDE VACCINE AGE 52-64 HIGH RISK  Completed  . HIV Screening  Completed  . Hepatitis C Screening  Addressed    Health Maintenance  Health Maintenance Due  Topic Date Due  . OPHTHALMOLOGY EXAM  07/28/2019  . COVID-19 Vaccine (3 - Pfizer risk 4-dose series) 12/11/2019  . FOOT EXAM  02/18/2020  . HEMOGLOBIN A1C  04/02/2020  . PAP SMEAR-Modifier  07/19/2020    Colorectal cancer screening: Type of screening: Colonoscopy. Completed 08/28/15. Repeat every 5 years  Mammogram status: Completed 07/01/20. Repeat every year. MRI scheduled for 07/14/20 on right breast  Bone Density status: Completed 03/03/20. Results reflect: Bone density results: OSTEOPENIA. Repeat every 2 years.  Lung Cancer Screening: (Low Dose CT Chest recommended if Age 38-80 years, 30 pack-year currently smoking OR have quit w/in 15years.) does qualify. Completed 02/05/20.   Additional Screening:  Hepatitis C Screening: does qualify; Completed 08/28/13  Vision Screening: Recommended annual ophthalmology exams for early detection of glaucoma and other  disorders of the eye. Is the patient up to date with their annual eye exam?  No  Who is the provider or what is the name of the office in which the patient attends annual eye exams? Dr. Ellin Mayhew  Dental Screening: Recommended annual dental exams for proper oral hygiene  Community Resource Referral / Chronic Care Management: CRR required this visit?  No   CCM required this visit?  No      Plan:     I have personally reviewed and noted the following in the patient's chart:   . Medical and social history . Use of alcohol, tobacco or illicit drugs  . Current medications and supplements . Functional ability and status . Nutritional status . Physical activity . Advanced directives . List of other physicians . Hospitalizations, surgeries, and ER visits in previous 12 months . Vitals . Screenings to include cognitive, depression, and falls . Referrals and appointments  In addition, I have reviewed and discussed with patient certain preventive protocols, quality metrics, and best practice recommendations. A written personalized care plan for preventive services as well as general preventive health recommendations were provided to patient.     Clemetine Marker, LPN  07/13/2020   Nurse Notes: pt advised due for follow up and labs with Dr. Army Melia and to schedule appt at check out.

## 2020-07-14 ENCOUNTER — Ambulatory Visit
Admission: RE | Admit: 2020-07-14 | Discharge: 2020-07-14 | Disposition: A | Discharge status: Home / Self Care | Payer: Medicare Other | Source: Ambulatory Visit | Attending: Hematology and Oncology | Admitting: Hematology and Oncology

## 2020-07-14 DIAGNOSIS — N644 Mastodynia: Secondary | ICD-10-CM | POA: Diagnosis not present

## 2020-07-14 DIAGNOSIS — N6011 Diffuse cystic mastopathy of right breast: Secondary | ICD-10-CM | POA: Diagnosis not present

## 2020-07-14 DIAGNOSIS — C50912 Malignant neoplasm of unspecified site of left female breast: Secondary | ICD-10-CM | POA: Diagnosis not present

## 2020-07-14 DIAGNOSIS — Z170 Estrogen receptor positive status [ER+]: Secondary | ICD-10-CM | POA: Insufficient documentation

## 2020-07-14 DIAGNOSIS — N6002 Solitary cyst of left breast: Secondary | ICD-10-CM | POA: Diagnosis not present

## 2020-07-14 DIAGNOSIS — Z853 Personal history of malignant neoplasm of breast: Secondary | ICD-10-CM | POA: Diagnosis not present

## 2020-07-14 MED ORDER — GADOBUTROL 1 MMOL/ML IV SOLN
9.0000 mL | Freq: Once | INTRAVENOUS | Status: AC | PRN
  Administered 2020-07-14: 9 mL via INTRAVENOUS

## 2020-07-15 ENCOUNTER — Telehealth (INDEPENDENT_AMBULATORY_CARE_PROVIDER_SITE_OTHER): Payer: Self-pay | Admitting: Gastroenterology

## 2020-07-15 ENCOUNTER — Other Ambulatory Visit: Payer: Self-pay

## 2020-07-15 DIAGNOSIS — Z8719 Personal history of other diseases of the digestive system: Secondary | ICD-10-CM

## 2020-07-15 MED ORDER — NA SULFATE-K SULFATE-MG SULF 17.5-3.13-1.6 GM/177ML PO SOLN: 1.0000 | Freq: Once | ORAL | 0 refills | Status: AC

## 2020-07-15 NOTE — Progress Notes (Signed)
Gastroenterology Pre-Procedure Review  Request Date: Friday 08/14/20 Requesting Physician: Dr. Bonna Gains  PATIENT REVIEW QUESTIONS: The patient responded to the following health history questions as indicated:    1. Are you having any GI issues? no 2. Do you have a personal history of Polyps? yes (07/31/15 Dr. Allen Norris noted 23mm rectal polyp) 3. Do you have a family history of Colon Cancer or Polyps? no 4. Diabetes Mellitus? yes 5. Joint replacements in the past 12 months?no 6. Major health problems in the past 3 months?no 7. Any artificial heart valves, MVP, or defibrillator?no    MEDICATIONS & ALLERGIES:    Patient reports the following regarding taking any anticoagulation/antiplatelet therapy:   Plavix, Coumadin, Eliquis, Xarelto, Lovenox, Pradaxa, Brilinta, or Effient? no Aspirin? no  Patient confirms/reports the following medications:  Current Outpatient Medications  Medication Sig Dispense Refill  . acetaminophen (TYLENOL) 325 MG tablet Take 650 mg by mouth daily as needed.    . Alcohol Swabs (B-D SINGLE USE SWABS REGULAR) PADS 1 each by Does not apply route 2 (two) times daily. 100 each 3  . amLODipine (NORVASC) 10 MG tablet TAKE TWO TABLETS BY MOUTH ONCE DAILY 60 tablet 2  . anastrozole (ARIMIDEX) 1 MG tablet TAKE ONE TABLET BY MOUTH ONCE DAILY 45 tablet 0  . aspirin EC 81 MG tablet Take 81 mg by mouth daily.    Marland Kitchen atorvastatin (LIPITOR) 10 MG tablet Take 1 tablet (10 mg total) by mouth daily. 90 tablet 1  . Blood Glucose Calibration (TRUE METRIX LEVEL 1) Low SOLN 1 each by In Vitro route daily as needed. 1 each 3  . Blood Glucose Monitoring Suppl (TRUE METRIX METER) DEVI 1 each by Does not apply route daily. 100 Device 3  . calcium-vitamin D (OSCAL WITH D) 500-200 MG-UNIT TABS tablet Take by mouth.    . hydrochlorothiazide (HYDRODIURIL) 25 MG tablet Take 25 mg by mouth daily.     . irbesartan (AVAPRO) 300 MG tablet TAKE ONE TABLET BY MOUTH ONCE DAILY 90 tablet 0  . metFORMIN  (GLUCOPHAGE-XR) 500 MG 24 hr tablet TAKE ONE TABLET BY MOUTH EVERY MORNING WITH BREAKFAST 90 tablet 0  . Na Sulfate-K Sulfate-Mg Sulf 17.5-3.13-1.6 GM/177ML SOLN Take 1 kit by mouth once for 1 dose. 354 mL 0   No current facility-administered medications for this visit.    Patient confirms/reports the following allergies:  No Known Allergies  No orders of the defined types were placed in this encounter.   AUTHORIZATION INFORMATION Primary Insurance: 1D#: Group #:  Secondary Insurance: 1D#: Group #:  SCHEDULE INFORMATION: Date: Friday 08/14/20 Time: Location:[facility]

## 2020-07-16 NOTE — Progress Notes (Signed)
[facility]  [address], [address] [place], [place] [address] Phone: [phone]  Fax: [phone]   Telemedicine Office Visit:  07/20/2020  Referring physician: Glean Hess, MD  I connected with Donna Riley on 07/20/2020 at 4:37 PM by videoconferencing and verified that I was speaking with the correct person using 2 identifiers.  The patient was at home.  I discussed the limitations, risk, security and privacy concerns of performing an evaluation and management service by videoconferencing and the availability of in person appointments.  I also discussed with the patient that there may be a patient responsible charge related to this service.  The patient expressed understanding and agreed to proceed.   Chief Complaint: Donna Riley is a 60 y.o. female with stage IIA left breast cancer who is seen for review of breast MRI.  HPI: The patient was last seen in the medical oncology clinic on 07/02/2020. At that time, she continued to experience shooting/burning breast pain (R>L). Her breasts were itchy. It hurt to move her arms.  She denied any pleuritic pain.  She denied any radiation of pain into her left jaw and down her arm.  She had some soreness in her right neck that made it difficult for her to swallow.   Bilateral breast MRI with and without contrast on 07/14/2020 revealed no MRI evidence of malignancy involving either breast. There were post lumpectomy changes involving the inner left breast.  Colonoscopy is scheduled for 08/14/2020 by Dr. Bonna Gains.  During the interim, her symptoms have improved. The range of motion in her arms has improved and her shooting breast pain has started to ease up.   Past Medical History:  Diagnosis Date  . Arthritis    shoulders and neck  . Breast cancer ([place]) 2016    Left breast with chemo + rad tx's with lumpectomy.  . Breast wound, left, sequela 06/23/2016  . Diabetes mellitus without complication ([place])  12/5883  . Elevated LFTs   . Fatigue 08/01/2016  . GERD (gastroesophageal reflux disease)   . Gout   . Hydradenitis 01/23/2015  . Hyperlipidemia   . Hypertension   . Mastitis 10/15/2016  . Personal history of chemotherapy 2016   left breast ca  . Personal history of radiation therapy 2016   LEFT lumpectomy  . Personal history of radiation therapy   . Positive PPD, treated   . Shortness of breath dyspnea    with exertion    Past Surgical History:  Procedure Laterality Date  . BREAST BIOPSY Left 07/31/2014   [facility] and DCIS   . BREAST BIOPSY Left 03/08/2016   INTRADUCTAL PAPILLOMA   . 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 BREAST LUMPECTOMY Left 04/05/2016   excision of intraductal papilloma, no malignancy or atypia  . BREAST LUMPECTOMY WITH NEEDLE LOCALIZATION Left 04/05/2016   Procedure: BREAST LUMPECTOMY WITH NEEDLE LOCALIZATION;  Surgeon: Hubbard Robinson, MD;  Location: [facility];  Service: General;  Laterality: Left;  . BREAST LUMPECTOMY WITH NEEDLE LOCALIZATION AND AXILLARY SENTINEL LYMPH NODE BX Left 08/27/14  . CHOLECYSTECTOMY  09/07/2013  . COLONOSCOPY WITH PROPOFOL N/A 08/28/2015   Procedure: COLONOSCOPY WITH PROPOFOL;  Surgeon: Lucilla Lame, MD;  Location: [facility];  Service: Endoscopy;  Laterality: N/A;  Diabetic oral  . ESOPHAGOGASTRODUODENOSCOPY  2014   gastritis  . ESOPHAGOGASTRODUODENOSCOPY (EGD) WITH PROPOFOL N/A 04/16/2019   Procedure: ESOPHAGOGASTRODUODENOSCOPY (EGD) WITH PROPOFOL;  Surgeon: Virgel Manifold, MD;  Location:  [facility];  Service: Endoscopy;  Laterality: N/A;  . ESOPHAGOGASTRODUODENOSCOPY (EGD) WITH PROPOFOL N/A 10/03/2019   Procedure: ESOPHAGOGASTRODUODENOSCOPY (EGD) WITH PROPOFOL;  Surgeon: Virgel Manifold, MD;  Location: [facility];  Service: Endoscopy;  Laterality: N/A;  . POLYPECTOMY  08/28/2015   Procedure: POLYPECTOMY INTESTINAL;  Surgeon: Lucilla Lame, MD;  Location: [facility];   Service: Endoscopy;;  Rectal polyp  . TUBAL LIGATION Bilateral   . UMBILICAL HERNIA REPAIR N/A 08/09/2016   Procedure: HERNIA REPAIR UMBILICAL ADULT;  Surgeon: Olean Ree, MD;  Location: [facility];  Service: General;  Laterality: N/A;  . VENTRAL HERNIA REPAIR N/A 04/10/2015   Procedure: HERNIA REPAIR VENTRAL ADULT;  Surgeon: Leonie Green, MD;  Location: [facility];  Service: General;  Laterality: N/A;    Family History  Problem Relation Age of Onset  . Cancer Mother        Pancreatic  . Cancer Father        Throat  . Healthy Sister   . Cancer Sister        liver  . HIV Brother   . Cancer Maternal Grandmother        Breast  . Breast cancer Maternal Grandmother 75    Social History:  reports that she has been smoking cigarettes. She has a 22.00 pack-year smoking history. She has never used smokeless tobacco. She reports current alcohol use of about 2.0 standard drinks of alcohol per week. She reports that she does not use drugs.  She has cut back on smoking to 6 cigarettes daily. She lost her husband in 05/2018.  She lives in [place]. The patient is alone today.  Participants in the patient's visit and their role in the encounter included the patient and Baxter Flattery Day, CMA The intake visit was provided by Baxter Flattery Day, CMA.  Allergies: No Known Allergies  Current Medications: Current Outpatient Medications  Medication Sig Dispense Refill  . acetaminophen (TYLENOL) 325 MG tablet Take 650 mg by mouth daily as needed.    . Alcohol Swabs (B-D SINGLE USE SWABS REGULAR) PADS 1 each by Does not apply route 2 (two) times daily. 100 each 3  . amLODipine (NORVASC) 10 MG tablet TAKE TWO TABLETS BY MOUTH ONCE DAILY 60 tablet 2  . anastrozole (ARIMIDEX) 1 MG tablet TAKE ONE TABLET BY MOUTH ONCE DAILY 45 tablet 0  . aspirin EC 81 MG tablet Take 81 mg by mouth daily.    Marland Kitchen atorvastatin (LIPITOR) 10 MG tablet Take 1 tablet (10 mg total) by mouth daily. 90 tablet 1  . Blood Glucose Calibration (TRUE  METRIX LEVEL 1) Low SOLN 1 each by In Vitro route daily as needed. 1 each 3  . Blood Glucose Monitoring Suppl (TRUE METRIX METER) DEVI 1 each by Does not apply route daily. 100 Device 3  . calcium-vitamin D (OSCAL WITH D) 500-200 MG-UNIT TABS tablet Take by mouth daily.    . hydrochlorothiazide (HYDRODIURIL) 25 MG tablet Take 25 mg by mouth daily.     . irbesartan (AVAPRO) 300 MG tablet TAKE ONE TABLET BY MOUTH ONCE DAILY 90 tablet 0  . metFORMIN (GLUCOPHAGE-XR) 500 MG 24 hr tablet TAKE ONE TABLET BY MOUTH EVERY MORNING WITH BREAKFAST (Patient taking differently: daily.) 90 tablet 0   No current facility-administered medications for this visit.    Review of Systems  Constitutional: Negative for chills, diaphoresis, fever, malaise/fatigue and weight loss (up 2 lbs).       Feels "alright."  HENT: Negative.  Negative for congestion,  ear discharge, ear pain, hearing loss, nosebleeds, sinus pain, sore throat and tinnitus.   Eyes: Negative.  Negative for blurred vision and double vision.  Respiratory: Negative.  Negative for cough, hemoptysis, sputum production, shortness of breath and wheezing.   Cardiovascular: Negative for chest pain, palpitations, orthopnea, leg swelling and PND.       Hypertension.  Gastrointestinal: Negative.  Negative for abdominal pain, blood in stool, constipation, diarrhea, heartburn, melena, nausea and vomiting.  Genitourinary: Negative.  Negative for dysuria, frequency, hematuria and urgency.  Musculoskeletal: Positive for back pain (chronic lower back cramps) and joint pain (right shoulder, limited ROM). Negative for falls, myalgias and neck pain.       Breast and nipple pain (R>L). Right jaw soreness.  Skin: Positive for itching (breasts). Negative for rash.  Neurological: Negative.  Negative for dizziness, tremors, sensory change, speech change, focal weakness, weakness and headaches.  Endo/Heme/Allergies: Negative.  Does not bruise/bleed easily.   Psychiatric/Behavioral: Negative.  Negative for depression and memory loss. The patient is not nervous/anxious and does not have insomnia.   All other systems reviewed and are negative.  Performance status (ECOG): 1   Vitals There were no vitals taken for this visit.   Physical Exam Vitals and nursing note reviewed.  Constitutional:      General: She is not in acute distress.    Appearance: She is well-developed. She is not diaphoretic.  HENT:     Head:     Comments: Shoulder length hair. Eyes:     General: No scleral icterus.    Conjunctiva/sclera: Conjunctivae normal.     Comments: Brown eyes.  Neurological:     Mental Status: She is alert and oriented to person, place, and time.  Psychiatric:        Behavior: Behavior normal.        Thought Content: Thought content normal.        Judgment: Judgment normal.    No visits with results within 3 Day(s) from this visit.  Latest known visit with results is:  [facility] on 06/10/2020  Component Date Value Ref Range Status  . POC Glucose 06/10/2020 104* 70 - 99 mg/dl Final    Assessment:  Donna Riley is a 60 y.o. female with stage IIA left breast cancerstatus post partial mastectomy and sentinel lymph node biopsy on 08/27/2014. 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 There was lymphovascular invasion. One of 2 sentinel lymph nodes were positive (focus of 2.8 mm). Tumor was >90% ER positive, >90% PR positive, and Her2/neu negative. Pathologic stage was [id number].  Bone scanon 09/16/2014 revealed abnormal focal uptake at the level of right L3 pedicle and L5 vertebral body. Lumbar spine MRI on 09/27/2014 revealed no evidence of metastatic disease with lower lumbar facet arthritis.Abdominal and pelvic CT scanon 09/24/2014 revealed hepatomegaly and no evidence of metastatic disease.   She received 4 cycles of Taxotere and  Cytoxan(09/29/2014 - 12/09/2014) with Neulasta support. She received 50.4 Gyto the left breast from 01/12/2015 until 02/23/2015. She was started on Femaraon 03/12/2015, but switched to [place] 03/30/2015 secondary to diffuse joint aches. Arimidex completed around 03/13/2020.  BCI testing on 08/20/2019 revealed an estimated risk of late recurrence between years 5-10 of 10.1% (95% CI: 5.6-14.3%).  She is not likely to benefit from extended endocrine therapy.  CA27.29has been followed: 16.9 on 09/09/2014, 12.1 on 06/02/2015, 15.6 on 08/07/2015, 17.4 on 11/23/2015, 13.6 on 02/22/2016, 18.9 on  06/20/2016, 18.4 on 10/21/2016, 15 on 02/23/2017, 14.1 on 06/26/2017, 15.6 on 12/25/2017, 12.4 on 08/08/2018, 8.8 on 02/08/2019, 19.2 on 08/08/2019, and 18 on 02/11/2020.  Bilateral diagnostic mammogramon 07/30/2015 revealed no evidence of malignancy. Left sided mammogram and ultrasound on 02/29/2016 revealed a 1.5 x 0.6 x 0.7 cm intraductal mass. Bilateral mammogramon 09/12/2016 revealed no focal fluid collection at the surgical site to account for the persistent drainage. There were no suspicious mammographic or targeted sonographic abnormalities at the lumpectomy site. There was no evidence of malignancy in the bilateral breasts.   Bilateral mammogram on 09/14/2017 that revealed maturing fat necrosis at the LEFT lumpectomy site. There was no evidence of malignancy in either breast. Left diagnostic mammogram and ultrasound on 01/01/2018 revealed no significant abnormality other than a single normal appearing lymph node over the axillary tail portion of the left breast in the area of palpable abnormality and tenderness.  Bilateral diagnostic mammogram on 09/17/2018 revealed no evidence of malignancy in either breast. There were expected post-lumpectomy changes involving the left breast. Diagnostic bilateral mammogram on 09/19/2019 revealed stable postsurgical changes in the left breast. There was no  evidence of malignancy.  Bilateral diagnostic mammogram on 07/01/2020 revealed no evidence of malignancy in either breast.  Targeted ultrasound on 07/01/2020 showed normal tissue throughoutthe upper-outer quadrant of the right breast from 8-12 o'clock. There was no solid or cystic mass, abnormal shadowing or distortion visualized.  Bilateral breast MRI on 07/14/2020 revealed no MRI evidence of malignancy involving either breast. There were post lumpectomy changes involving the inner left breast.  She had chronic left nipple discharge. Left breast lumpectomyat the 11 o'clock position on 04/05/2016 revealed an intraductal papillomawith sclerosis and calcifications. There was fat necrosis with fibrosis and calcifications. Pathology was negative for atypia and malignancy  Colonoscopyon 08/28/2015 revealed one 4 mm polyp in the rectum (tubular adenoma). EGD on 10/03/2019 revealed erythematous mucosa in the antrum. Pathology revealed antral and oxyntic mucosa with moderate chronic inactive gastritis. H. Pylori was negative. There was no intestinal metaplasia, dysplasia, and malignancy.   Bone densitystudy on 09/15/2014 revealed osteopeniawith a T-score of -2.1 at L1-L4. Bone densityon 01/17/2018 revealed osteoporosiswith a T-score of -2.6 in the AP spine L1-L4. Bone density on 03/03/2020 revealed osteopenia with a T score of -2.2 at the lumbar spine. She is on calcium and vitamin D.   She has a smoking history.  Low dose chest CT on 02/05/2020 revealed Lung-RADS 2S, benign appearance or behavior.  There was aortic atherosclerosis, in addition to left anterior descending coronary artery disease. There was mild diffuse bronchial wall thickening with mild centrilobular and paraseptal emphysema suggestive of underlying COPD.  Symptomatically, her symptoms have improved. The range of motion in her arms has improved.  Plan: 1.   Stage IIALEFTbreast cancer Clinically, her symptoms have  improved. Bilateral diagnostic mammogram on 09/19/2019 revealed no evidence of malignancy. Bilateral diagnostic mammogram and targeted ultrasound on 07/01/2020 revealed no evidence of mass or suspicious lesions. Bilateral breast MRI in 07/14/2020 revealed no evidence of malignancy.   She completed 5 years of endocrine therapy on 03/13/2020.   BCI testing on 08/20/2019 revealed no benefit from extended endocrine therapy Continue surveillance. 2.   Osteopenia             Bone density on 03/03/2020 revealed osteopenia with a T-score of -2.2 (improved).  Continue calcium and vitamin D. 3.   Smoking history  She has a 33 pack year smoking history.  Low-dose chest CT on 02/05/2020 revealed no  evidence of malignancy.  Anticipate follow-up low-dose chest CT in 01/2021. 4.   Coronary artery disease  Low-dose chest CT on 02/05/2020 revealed aortic atherosclerosis and left anterior descending coronary artery disease.   Significance of this is unclear.   Follow-up with Dr Army Melia. 5.   RTC on 08/10/2020 for lab only appt. 6.   RTC on 12/27/2020 for MD assess with breast exam, and labs (CBC with diff, CMP, CA27.29).  I discussed the assessment and treatment plan with the patient.  The patient was provided an opportunity to ask questions and all were answered.  The patient agreed with the plan and demonstrated an understanding of the instructions.  The patient was advised to call back if the symptoms worsen or if the condition fails to improve as anticipated.  I provided 9 minutes (4:28 PM - 4:36 PM) of face-to-face video visit time during this this encounter and > 50% was spent counseling as documented under my assessment and plan.  I provided these services from the [facility] office.  An additional 5 minutes were spent reviewing her chart (Epic and Care Everywhere) including notes, labs, and imaging studies.    Lequita Asal, MD, PhD    07/20/2020, 4:37 PM  I, Mirian Mo Tufford, am acting as Education administrator  for [employer]. Mike Gip, MD, PhD.  I, Ricky Gallery C. Mike Gip, MD, have reviewed the above documentation for accuracy and completeness, and I agree with the above. Marland Kitchen

## 2020-07-19 DIAGNOSIS — M8588 Other specified disorders of bone density and structure, other site: Secondary | ICD-10-CM | POA: Insufficient documentation

## 2020-07-19 DIAGNOSIS — M8580 Other specified disorders of bone density and structure, unspecified site: Secondary | ICD-10-CM | POA: Insufficient documentation

## 2020-07-20 ENCOUNTER — Other Ambulatory Visit: Payer: Self-pay

## 2020-07-20 ENCOUNTER — Inpatient Hospital Stay (HOSPITAL_BASED_OUTPATIENT_CLINIC_OR_DEPARTMENT_OTHER): Payer: Medicare Other | Admitting: Hematology and Oncology

## 2020-07-20 ENCOUNTER — Encounter: Payer: Self-pay | Admitting: Hematology and Oncology

## 2020-07-20 DIAGNOSIS — M8588 Other specified disorders of bone density and structure, other site: Secondary | ICD-10-CM | POA: Diagnosis not present

## 2020-07-20 DIAGNOSIS — Z170 Estrogen receptor positive status [ER+]: Secondary | ICD-10-CM

## 2020-07-20 DIAGNOSIS — C50912 Malignant neoplasm of unspecified site of left female breast: Secondary | ICD-10-CM

## 2020-07-24 ENCOUNTER — Other Ambulatory Visit
Admission: RE | Admit: 2020-07-24 | Discharge: 2020-07-24 | Disposition: A | Discharge status: Home / Self Care | Payer: Medicare Other | Attending: Internal Medicine | Admitting: Internal Medicine

## 2020-07-24 ENCOUNTER — Other Ambulatory Visit: Payer: Self-pay | Admitting: Internal Medicine

## 2020-07-24 ENCOUNTER — Other Ambulatory Visit: Payer: Self-pay

## 2020-07-24 ENCOUNTER — Ambulatory Visit (INDEPENDENT_AMBULATORY_CARE_PROVIDER_SITE_OTHER): Payer: Medicare Other | Admitting: Internal Medicine

## 2020-07-24 ENCOUNTER — Encounter: Payer: Self-pay | Admitting: Internal Medicine

## 2020-07-24 VITALS — BP 132/76 | HR 80 | Temp 98.3°F | Ht 68.0 in | Wt 208.0 lb

## 2020-07-24 DIAGNOSIS — C773 Secondary and unspecified malignant neoplasm of axilla and upper limb lymph nodes: Secondary | ICD-10-CM

## 2020-07-24 DIAGNOSIS — I2510 Atherosclerotic heart disease of native coronary artery without angina pectoris: Secondary | ICD-10-CM

## 2020-07-24 DIAGNOSIS — E118 Type 2 diabetes mellitus with unspecified complications: Secondary | ICD-10-CM

## 2020-07-24 DIAGNOSIS — I10 Essential (primary) hypertension: Secondary | ICD-10-CM | POA: Diagnosis not present

## 2020-07-24 DIAGNOSIS — I700 Atherosclerosis of aorta: Secondary | ICD-10-CM | POA: Diagnosis not present

## 2020-07-24 DIAGNOSIS — E781 Pure hyperglyceridemia: Secondary | ICD-10-CM

## 2020-07-24 DIAGNOSIS — E1169 Type 2 diabetes mellitus with other specified complication: Secondary | ICD-10-CM | POA: Insufficient documentation

## 2020-07-24 DIAGNOSIS — E785 Hyperlipidemia, unspecified: Secondary | ICD-10-CM | POA: Diagnosis not present

## 2020-07-24 LAB — LIPID PANEL
Cholesterol: 159 mg/dL (ref 0–200)
HDL: 37 mg/dL — ABNORMAL LOW (ref >40)
LDL Cholesterol: 62 mg/dL (ref 0–99)
Total CHOL/HDL Ratio: 4.3 RATIO
Triglycerides: 301 mg/dL — ABNORMAL HIGH (ref <150)
VLDL: 60 mg/dL — ABNORMAL HIGH (ref 0–40)

## 2020-07-24 LAB — BASIC METABOLIC PANEL
Anion gap: 11 (ref 5–15)
BUN: 8 mg/dL (ref 6–20)
CO2: 27 mmol/L (ref 22–32)
Calcium: 9.4 mg/dL (ref 8.9–10.3)
Chloride: 103 mmol/L (ref 98–111)
Creatinine, Ser: 0.69 mg/dL (ref 0.44–1.00)
GFR, Estimated: >60 mL/min (ref >60)
Glucose, Bld: 107 mg/dL — ABNORMAL HIGH (ref 70–99)
Potassium: 3.7 mmol/L (ref 3.5–5.1)
Sodium: 141 mmol/L (ref 135–145)

## 2020-07-24 LAB — HEMOGLOBIN A1C
Hgb A1c MFr Bld: 6.2 % — ABNORMAL HIGH (ref 4.8–5.6)
Mean Plasma Glucose: 131.24 mg/dL

## 2020-07-24 MED ORDER — ATORVASTATIN CALCIUM 10 MG PO TABS: 10.0000 mg | ORAL_TABLET | Freq: Every day | ORAL | 3 refills | Status: DC

## 2020-07-24 MED ORDER — FENOFIBRATE 145 MG PO TABS: 145.0000 mg | ORAL_TABLET | Freq: Every day | ORAL | 1 refills | Status: DC

## 2020-07-24 NOTE — Progress Notes (Signed)
Date:  07/24/2020   Name:  Donna Riley   DOB:  [birth year]   MRN:  [id number]   Chief Complaint: Diabetes (Last blood sugar 105 yesterday , foot exam ) and Hypertension  Diabetes She presents for her follow-up diabetic visit. She has type 2 diabetes mellitus. Her disease course has been stable. Pertinent negatives for hypoglycemia include no dizziness, headaches, nervousness/anxiousness or tremors. Pertinent negatives for diabetes include no chest pain, no fatigue, no polydipsia and no polyuria. Pertinent negatives for diabetic complications include no CVA. Current diabetic treatment includes oral agent (monotherapy) (metformine). She is following a generally healthy diet. An ACE inhibitor/angiotensin II receptor blocker is being taken. She sees a podiatrist.Eye exam is not current.  Hypertension This is a chronic problem. The problem is controlled. Associated symptoms include shortness of breath (mild sx on exertion). Pertinent negatives include no chest pain, headaches or palpitations. Past treatments include calcium channel blockers, diuretics and angiotensin blockers. The current treatment provides significant improvement. There is no history of kidney disease, CAD/MI or CVA.  Hyperlipidemia This is a chronic problem. The problem is controlled. Associated symptoms include shortness of breath (mild sx on exertion). Pertinent negatives include no chest pain. Current antihyperlipidemic treatment includes statins. The current treatment provides significant improvement of lipids. There are no compliance problems.  Risk factors for coronary artery disease include diabetes mellitus, dyslipidemia and hypertension (atherosclerosis seen on chest CT).    Lab Results  Component Value Date   CREATININE 0.82 02/11/2020   BUN 14 02/11/2020   NA 139 02/11/2020   K 3.5 02/11/2020   CL 104 02/11/2020   CO2 24 02/11/2020   Lab Results  Component Value Date   CHOL 177 10/09/2018   HDL 37 (L)  10/09/2018   LDLCALC 70 10/09/2018   TRIG 349 (H) 10/09/2018   CHOLHDL 4.8 10/09/2018   Lab Results  Component Value Date   TSH 1.765 04/11/2018   Lab Results  Component Value Date   HGBA1C 6.3 (H) 10/01/2019   Lab Results  Component Value Date   WBC 6.6 02/11/2020   HGB 14.0 02/11/2020   HCT 43.3 02/11/2020   MCV 85.6 02/11/2020   PLT 233 02/11/2020   Lab Results  Component Value Date   ALT 33 02/11/2020   AST 23 02/11/2020   ALKPHOS 83 02/11/2020   BILITOT 0.4 02/11/2020     Review of Systems  Constitutional: Negative for chills, fatigue and fever.  HENT: Negative for congestion, hearing loss, tinnitus, trouble swallowing and voice change.   Eyes: Negative for visual disturbance.  Respiratory: Positive for shortness of breath (mild sx on exertion). Negative for cough, chest tightness and wheezing.   Cardiovascular: Negative for chest pain, palpitations and leg swelling.  Gastrointestinal: Negative for abdominal pain, constipation, diarrhea and vomiting.  Endocrine: Negative for polydipsia and polyuria.  Genitourinary: Negative for dysuria, frequency, genital sores, vaginal bleeding and vaginal discharge.  Musculoskeletal: Negative for arthralgias, gait problem and joint swelling.  Skin: Negative for color change and rash.  Neurological: Negative for dizziness, tremors, light-headedness and headaches.  Hematological: Negative for adenopathy. Does not bruise/bleed easily.  Psychiatric/Behavioral: Negative for dysphoric mood and sleep disturbance. The patient is not nervous/anxious.     Patient Active Problem List   Diagnosis Date Noted  . Osteopenia of lumbar spine 07/19/2020  . Breast pain, right 07/03/2020  . Screening for condition 06/10/2020  . Smoking history 02/11/2020  . Aortic atherosclerosis ([place]) 02/06/2020  . Tobacco use disorder, continuous  01/14/2020  . Helicobacter pylori gastritis 06/03/2019  . Abdominal bloating   . Nausea   . 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 Lumbosacral spondylosis without myelopathy 11/22/2018  . Vertigo 10/09/2018  . Acute midline low back pain without sciatica 10/09/2018  . Hyperlipidemia associated with type 2 diabetes mellitus ([place]) 04/11/2018  . Gastroparesis 04/11/2018  . Long term (current) use of aromatase inhibitors 12/25/2017  . Arthritis   . Elevated LFTs   . Positive PPD, treated   . Umbilical hernia without obstruction and without gangrene 07/13/2016  . Rectal polyp   . Irritable bowel syndrome with both constipation and diarrhea 07/20/2015  . Type II diabetes mellitus with complication ([place]) 32/44/0102  . Pre-ulcerative calluses 06/22/2015  . Neuropathy 06/22/2015  . Hot flash, menopausal 04/09/2015  . Essential (primary) hypertension 03/25/2015  . Osteoporosis of lumbar spine 03/12/2015  . Malignant neoplasm of left breast in female, estrogen receptor positive ([place]) 05/30/2014  . 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 BREAST BIOPSY Left 07/31/2014   [facility] and [facility]   . BREAST BIOPSY Left 03/08/2016   INTRADUCTAL PAPILLOMA   . 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 BREAST LUMPECTOMY Left 04/05/2016   excision of intraductal papilloma, no malignancy or atypia  . BREAST LUMPECTOMY WITH NEEDLE LOCALIZATION Left 04/05/2016   Procedure: BREAST LUMPECTOMY WITH NEEDLE LOCALIZATION;  Surgeon: Hubbard Robinson, MD;  Location: [facility];  Service: General;  Laterality: Left;  . BREAST LUMPECTOMY WITH NEEDLE LOCALIZATION AND AXILLARY SENTINEL LYMPH NODE BX Left 08/27/14  . CHOLECYSTECTOMY  09/07/2013  . COLONOSCOPY WITH PROPOFOL N/A 08/28/2015   Procedure: COLONOSCOPY WITH PROPOFOL;  Surgeon: Lucilla Lame, MD;  Location: [place];  Service: Endoscopy;  Laterality: N/A;  Diabetic oral  .  ESOPHAGOGASTRODUODENOSCOPY  2014   gastritis  . ESOPHAGOGASTRODUODENOSCOPY (EGD) WITH PROPOFOL N/A 04/16/2019   Procedure: ESOPHAGOGASTRODUODENOSCOPY (EGD) WITH PROPOFOL;  Surgeon: Virgel Manifold, MD;  Location: [facility];  Service: Endoscopy;  Laterality: N/A;  . ESOPHAGOGASTRODUODENOSCOPY (EGD) WITH PROPOFOL N/A 10/03/2019   Procedure: ESOPHAGOGASTRODUODENOSCOPY (EGD) WITH PROPOFOL;  Surgeon: Virgel Manifold, MD;  Location: [facility];  Service: Endoscopy;  Laterality: N/A;  . POLYPECTOMY  08/28/2015   Procedure: POLYPECTOMY INTESTINAL;  Surgeon: Lucilla Lame, MD;  Location: [place];  Service: Endoscopy;;  Rectal polyp  . TUBAL LIGATION Bilateral   . UMBILICAL HERNIA REPAIR N/A 08/09/2016   Procedure: HERNIA REPAIR UMBILICAL ADULT;  Surgeon: Olean Ree, MD;  Location: [facility];  Service: General;  Laterality: N/A;  . VENTRAL HERNIA REPAIR N/A 04/10/2015   Procedure: HERNIA REPAIR VENTRAL ADULT;  Surgeon: Leonie Green, MD;  Location: [facility];  Service: General;  Laterality: N/A;    Social History   Tobacco Use  . Smoking status: Current Every Day Smoker    Packs/day: 0.50    Years: 44.00    Pack years: 22.00    Types: Cigarettes  . Smokeless tobacco: Never Used  Vaping Use  . Vaping Use: Never used  Substance Use Topics  . Alcohol use: Yes    Alcohol/week: 2.0 standard drinks    Types: 2 Cans of beer per week    Comment: Occasionally .beer yesterday morning  . Drug use: No     Medication list has been reviewed and updated.  Current Meds  Medication Sig  . acetaminophen (TYLENOL) 325 MG tablet Take 650 mg by mouth daily as needed.  . Alcohol Swabs (B-D SINGLE USE SWABS REGULAR) PADS 1 each by Does not apply route 2 (two) times daily.  Marland Kitchen amLODipine (NORVASC) 10 MG tablet TAKE TWO TABLETS BY MOUTH ONCE DAILY  . aspirin EC 81 MG tablet Take 81 mg by mouth daily.  Marland Kitchen atorvastatin (LIPITOR) 10 MG tablet Take 1 tablet (10 mg total) by mouth  daily.  . Blood Glucose Calibration (TRUE METRIX LEVEL 1) Low SOLN 1 each by In Vitro route daily as needed.  . Blood Glucose Monitoring Suppl (TRUE METRIX METER) DEVI 1 each by Does not apply route daily.  . calcium-vitamin D (OSCAL WITH D) 500-200 MG-UNIT TABS tablet Take by mouth daily.  . hydrochlorothiazide (HYDRODIURIL) 25 MG tablet Take 25 mg by mouth daily.   . irbesartan (AVAPRO) 300 MG tablet TAKE ONE TABLET BY MOUTH ONCE DAILY  . metFORMIN (GLUCOPHAGE-XR) 500 MG 24 hr tablet TAKE ONE TABLET BY MOUTH EVERY MORNING WITH BREAKFAST (Patient taking differently: daily.)    PHQ 2/9 Scores 07/24/2020 07/13/2020 10/01/2019 08/27/2019  PHQ - 2 Score 0 0 0 0  PHQ- 9 Score 0 - 0 6    GAD 7 : Generalized Anxiety Score 07/24/2020 10/01/2019  Nervous, Anxious, on Edge 0 0  Control/stop worrying 0 0  Worry too much - different things 0 0  Trouble relaxing 0 0  Restless 0 0  Easily annoyed or irritable 0 0  Afraid - awful might happen 0 0  Total GAD 7 Score 0 0  Anxiety Difficulty - Not difficult at all    BP Readings from Last 3 Encounters:  07/24/20 132/76  07/13/20 132/82  07/02/20 (!) 169/78    Physical Exam Vitals and nursing note reviewed.  Constitutional:      General: She is not in acute distress.    Appearance: She is well-developed.  HENT:     Head: Normocephalic and atraumatic.     Right Ear: Tympanic membrane and ear canal normal.     Left Ear: Tympanic membrane and ear canal normal.     Nose:     Right Sinus: No maxillary sinus tenderness.     Left Sinus: No maxillary sinus tenderness.  Eyes:     General: No scleral icterus.       Right eye: No discharge.        Left eye: No discharge.     Conjunctiva/sclera: Conjunctivae normal.  Neck:     Thyroid: No thyromegaly.     Vascular: No carotid bruit.  Cardiovascular:     Rate and Rhythm: Normal rate and regular rhythm.     Pulses: Normal pulses.     Heart sounds: Normal heart sounds.  Pulmonary:     Effort:  Pulmonary effort is normal. No respiratory distress.     Breath sounds: No wheezing.  Abdominal:     General: Bowel sounds are normal.     Palpations: Abdomen is soft.     Tenderness: There is no abdominal tenderness.  Musculoskeletal:     Cervical back: Normal range of motion. No erythema.     Right lower leg: No edema.     Left lower leg: No edema.  Lymphadenopathy:     Cervical: No cervical adenopathy.  Skin:    General: Skin is warm and dry.     Findings: No rash.  Neurological:     Mental Status: She is alert  and oriented to person, place, and time.     Cranial Nerves: No cranial nerve deficit.     Sensory: No sensory deficit.     Deep Tendon Reflexes: Reflexes are normal and symmetric.  Psychiatric:        Attention and Perception: Attention normal.        Mood and Affect: Mood normal.     Wt Readings from Last 3 Encounters:  07/24/20 208 lb (94.3 kg)  07/13/20 210 lb 9.6 oz (95.5 kg)  07/02/20 211 lb 5 oz (95.8 kg)    BP 132/76   Pulse 80   Temp 98.3 F (36.8 C) (Oral)   Ht 5\' 8"  (1.727 m)   Wt 208 lb (94.3 kg)   SpO2 96%   BMI 31.63 kg/m   Assessment and Plan: 1. Type II diabetes mellitus with complication (HCC) Clinically stable by exam and report without s/s of hypoglycemia. DM complicated by htn. Tolerating medications - out of farxiga for a week or two -  well without side effects or other concerns. - Hemoglobin Q9V - Basic metabolic panel  2. Essential (primary) hypertension Clinically stable exam with well controlled BP. Tolerating medications without side effects at this time. Pt to continue current regimen and low sodium diet; benefits of regular exercise as able discussed.  3. Hyperlipidemia associated with type 2 diabetes mellitus ([place]) Tolerating statin medication without side effects at this time LDL is at goal of < 70 on current dose Continue same therapy without change at this time. - Lipid panel - atorvastatin (LIPITOR) 10 MG tablet;  Take 1 tablet (10 mg total) by mouth daily.  Dispense: 90 tablet; Refill: 3  4. 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 Aortic atherosclerosis (HCC) Continue statin  6. Atherosclerosis of coronary artery of native heart without angina pectoris, unspecified vessel or lesion type Seen on CT screening - she denies CP but does have some mild DOE Needs cardiology evaluation due to the atherosclerosis in the LAD - Ambulatory referral to Cardiology   Partially dictated using [place]. Any errors are unintentional.  Halina Maidens, MD [facility]  07/24/2020

## 2020-08-03 ENCOUNTER — Telehealth: Payer: Self-pay | Admitting: Internal Medicine

## 2020-08-03 NOTE — Telephone Encounter (Signed)
Please review.  KP

## 2020-08-03 NOTE — Telephone Encounter (Signed)
Patient called to inform the nurse and doctor that the cholesterol medications, atorvastatin (LIPITOR) 10 MG tablet and fenofibrate (TRICOR) 145 MG tablet are making her have bad muscle spasms.  She said her body is locking up and is very sore when she takes them.  Please advise and call patient to discuss asap at [phone]

## 2020-08-03 NOTE — Telephone Encounter (Signed)
Spoke to Dr. Army Melia. Pt is to stop taking fenofibrate and continue to take atorvastatin. Pt has been taking atorvastatin previously and now has symptoms after adding fenofibrate. Pt verbalized understanding.  KP

## 2020-08-03 NOTE — Telephone Encounter (Signed)
Instruct her to stop the medication and add it to her allergy/sensitivities list.

## 2020-08-10 ENCOUNTER — Inpatient Hospital Stay: Payer: Medicare Other | Attending: Hematology and Oncology

## 2020-08-10 ENCOUNTER — Other Ambulatory Visit: Payer: Self-pay

## 2020-08-10 ENCOUNTER — Ambulatory Visit: Payer: Medicare Other | Admitting: Hematology and Oncology

## 2020-08-10 DIAGNOSIS — Z170 Estrogen receptor positive status [ER+]: Secondary | ICD-10-CM | POA: Insufficient documentation

## 2020-08-10 DIAGNOSIS — C773 Secondary and unspecified malignant neoplasm of axilla and upper limb lymph nodes: Secondary | ICD-10-CM | POA: Insufficient documentation

## 2020-08-10 DIAGNOSIS — C50912 Malignant neoplasm of unspecified site of left female breast: Secondary | ICD-10-CM | POA: Insufficient documentation

## 2020-08-10 LAB — COMPREHENSIVE METABOLIC PANEL
ALT: 21 U/L (ref 0–44)
AST: 25 U/L (ref 15–41)
Albumin: 4.2 g/dL (ref 3.5–5.0)
Alkaline Phosphatase: 79 U/L (ref 38–126)
Anion gap: 12 (ref 5–15)
BUN: 17 mg/dL (ref 6–20)
CO2: 21 mmol/L — ABNORMAL LOW (ref 22–32)
Calcium: 9.2 mg/dL (ref 8.9–10.3)
Chloride: 101 mmol/L (ref 98–111)
Creatinine, Ser: 0.9 mg/dL (ref 0.44–1.00)
GFR, Estimated: >60 mL/min (ref >60)
Glucose, Bld: 297 mg/dL — ABNORMAL HIGH (ref 70–99)
Potassium: 3.7 mmol/L (ref 3.5–5.1)
Sodium: 134 mmol/L — ABNORMAL LOW (ref 135–145)
Total Bilirubin: 0.5 mg/dL (ref 0.3–1.2)
Total Protein: 7.7 g/dL (ref 6.5–8.1)

## 2020-08-10 LAB — CBC WITH DIFFERENTIAL/PLATELET
Abs Immature Granulocytes: 0.03 10*3/uL (ref 0.00–0.07)
Basophils Absolute: 0.1 10*3/uL (ref 0.0–0.1)
Basophils Relative: 1 %
Eosinophils Absolute: 0.1 10*3/uL (ref 0.0–0.5)
Eosinophils Relative: 2 %
HCT: 41.3 % (ref 36.0–46.0)
Hemoglobin: 13.5 g/dL (ref 12.0–15.0)
Immature Granulocytes: 0 %
Lymphocytes Relative: 34 %
Lymphs Abs: 2.8 10*3/uL (ref 0.7–4.0)
MCH: 27.8 pg (ref 26.0–34.0)
MCHC: 32.7 g/dL (ref 30.0–36.0)
MCV: 85 fL (ref 80.0–100.0)
Monocytes Absolute: 0.4 10*3/uL (ref 0.1–1.0)
Monocytes Relative: 5 %
Neutro Abs: 4.9 10*3/uL (ref 1.7–7.7)
Neutrophils Relative %: 58 %
Platelets: 307 10*3/uL (ref 150–400)
RBC: 4.86 MIL/uL (ref 3.87–5.11)
RDW: 13.6 % (ref 11.5–15.5)
WBC: 8.3 10*3/uL (ref 4.0–10.5)
nRBC: 0 % (ref 0.0–0.2)

## 2020-08-11 ENCOUNTER — Ambulatory Visit (INDEPENDENT_AMBULATORY_CARE_PROVIDER_SITE_OTHER): Payer: Medicare Other | Admitting: Surgery

## 2020-08-11 ENCOUNTER — Encounter: Payer: Self-pay | Admitting: Surgery

## 2020-08-11 VITALS — BP 133/61 | HR 104 | Temp 98.2°F | Ht 68.0 in | Wt 204.0 lb

## 2020-08-11 DIAGNOSIS — L723 Sebaceous cyst: Secondary | ICD-10-CM

## 2020-08-11 DIAGNOSIS — L720 Epidermal cyst: Secondary | ICD-10-CM | POA: Diagnosis not present

## 2020-08-11 DIAGNOSIS — R1030 Lower abdominal pain, unspecified: Secondary | ICD-10-CM

## 2020-08-11 LAB — CANCER ANTIGEN 27.29: CA 27.29: 21.1 U/mL (ref 0.0–38.6)

## 2020-08-11 MED ORDER — HYDROCODONE-ACETAMINOPHEN 10-325 MG PO TABS: 1.0000 | ORAL_TABLET | Freq: Four times a day (QID) | ORAL | 0 refills | Status: DC | PRN

## 2020-08-11 MED ORDER — CEPHALEXIN 500 MG PO CAPS: 500.0000 mg | ORAL_CAPSULE | Freq: Four times a day (QID) | ORAL | 0 refills | Status: AC

## 2020-08-11 MED ORDER — TRIAMCINOLONE ACETONIDE 40 MG/ML IJ SUSP
40.0000 mg | Freq: Once | INTRAMUSCULAR | Status: AC
  Administered 2020-08-11: 20 mg via INTRAMUSCULAR

## 2020-08-11 NOTE — Progress Notes (Signed)
Patient ID: Donna Riley, female   DOB: 1961/03/25, 60 y.o.   MRN: [id number]  Chief Complaint: Abscess left groin  History of Present Illness Donna Riley is a 60 y.o. female with what she calls an abscess/boil to left groin area.  She reports pain since March 10.  She denies drainage despite her attempts at warm compresses.  She reports the pain is a 9.  She notes localized redness.  She denies any fevers and chills.  She has had prior similar lesions I indeed in the past I think one was in the axilla.  Past Medical History Past Medical History:  Diagnosis Date  . Arthritis    shoulders and neck  . Breast cancer ([place]) 2016    Left breast with chemo + rad tx's with lumpectomy.  . Breast wound, left, sequela 06/23/2016  . Diabetes mellitus without complication ([place]) 12/3380  . Elevated LFTs   . Fatigue 08/01/2016  . GERD (gastroesophageal reflux disease)   . Gout   . Hydradenitis 01/23/2015  . Hyperlipidemia   . Hypertension   . Mastitis 10/15/2016  . Personal history of chemotherapy 2016   left breast ca  . Personal history of radiation therapy 2016   LEFT lumpectomy  . Personal history of radiation therapy   . Positive PPD, treated   . Shortness of breath dyspnea    with exertion      Past Surgical History:  Procedure Laterality Date  . BREAST BIOPSY Left 07/31/2014   [facility] and DCIS   . BREAST BIOPSY Left 03/08/2016   INTRADUCTAL PAPILLOMA   . 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 BREAST LUMPECTOMY Left 04/05/2016   excision of intraductal papilloma, no malignancy or atypia  . BREAST LUMPECTOMY WITH NEEDLE LOCALIZATION Left 04/05/2016   Procedure: BREAST LUMPECTOMY WITH NEEDLE LOCALIZATION;  Surgeon: Hubbard Robinson, MD;  Location: [facility];  Service: General;  Laterality: Left;  . BREAST LUMPECTOMY WITH NEEDLE LOCALIZATION AND AXILLARY SENTINEL LYMPH NODE BX Left 08/27/14  . CHOLECYSTECTOMY  09/07/2013  . COLONOSCOPY WITH PROPOFOL  N/A 08/28/2015   Procedure: COLONOSCOPY WITH PROPOFOL;  Surgeon: Lucilla Lame, MD;  Location: [place];  Service: Endoscopy;  Laterality: N/A;  Diabetic oral  . ESOPHAGOGASTRODUODENOSCOPY  2014   gastritis  . ESOPHAGOGASTRODUODENOSCOPY (EGD) WITH PROPOFOL N/A 04/16/2019   Procedure: ESOPHAGOGASTRODUODENOSCOPY (EGD) WITH PROPOFOL;  Surgeon: Virgel Manifold, MD;  Location: [facility];  Service: Endoscopy;  Laterality: N/A;  . ESOPHAGOGASTRODUODENOSCOPY (EGD) WITH PROPOFOL N/A 10/03/2019   Procedure: ESOPHAGOGASTRODUODENOSCOPY (EGD) WITH PROPOFOL;  Surgeon: Virgel Manifold, MD;  Location: [facility];  Service: Endoscopy;  Laterality: N/A;  . POLYPECTOMY  08/28/2015   Procedure: POLYPECTOMY INTESTINAL;  Surgeon: Lucilla Lame, MD;  Location: [place];  Service: Endoscopy;;  Rectal polyp  . TUBAL LIGATION Bilateral   . UMBILICAL HERNIA REPAIR N/A 08/09/2016   Procedure: HERNIA REPAIR UMBILICAL ADULT;  Surgeon: Olean Ree, MD;  Location: [facility];  Service: General;  Laterality: N/A;  . VENTRAL HERNIA REPAIR N/A 04/10/2015   Procedure: HERNIA REPAIR VENTRAL ADULT;  Surgeon: Leonie Green, MD;  Location: [facility];  Service: General;  Laterality: N/A;    No Known Allergies  Current Outpatient Medications  Medication Sig Dispense Refill  . acetaminophen (TYLENOL) 325 MG tablet Take 650 mg by mouth daily as needed.    . Alcohol Swabs (B-D SINGLE USE SWABS REGULAR) PADS 1 each by Does not apply route 2 (  two) times daily. 100 each 3  . amLODipine (NORVASC) 10 MG tablet TAKE TWO TABLETS BY MOUTH ONCE DAILY 60 tablet 2  . aspirin EC 81 MG tablet Take 81 mg by mouth daily.    Marland Kitchen atorvastatin (LIPITOR) 10 MG tablet Take 1 tablet (10 mg total) by mouth daily. 90 tablet 3  . Blood Glucose Calibration (TRUE METRIX LEVEL 1) Low SOLN 1 each by In Vitro route daily as needed. 1 each 3  . Blood Glucose Monitoring Suppl (TRUE METRIX METER) DEVI 1 each by Does not apply route  daily. 100 Device 3  . calcium-vitamin D (OSCAL WITH D) 500-200 MG-UNIT TABS tablet Take by mouth daily.    . cephALEXin (KEFLEX) 500 MG capsule Take 1 capsule (500 mg total) by mouth 4 (four) times daily for 10 days. 40 capsule 0  . hydrochlorothiazide (HYDRODIURIL) 25 MG tablet Take 25 mg by mouth daily.     . irbesartan (AVAPRO) 300 MG tablet TAKE ONE TABLET BY MOUTH ONCE DAILY 90 tablet 0  . metFORMIN (GLUCOPHAGE-XR) 500 MG 24 hr tablet TAKE ONE TABLET BY MOUTH EVERY MORNING WITH BREAKFAST (Patient taking differently: daily.) 90 tablet 0   No current facility-administered medications for this visit.    Family History Family History  Problem Relation Age of Onset  . Cancer Mother        Pancreatic  . Cancer Father        Throat  . Healthy Sister   . Cancer Sister        liver  . HIV Brother   . Cancer Maternal Grandmother        Breast  . Breast cancer Maternal Grandmother 75      Social History Social History   Tobacco Use  . Smoking status: Current Every Day Smoker    Packs/day: 0.50    Years: 44.00    Pack years: 22.00    Types: Cigarettes  . Smokeless tobacco: Never Used  Vaping Use  . Vaping Use: Never used  Substance Use Topics  . Alcohol use: Yes    Alcohol/week: 2.0 standard drinks    Types: 2 Cans of beer per week    Comment: Occasionally .beer yesterday morning  . Drug use: No        Review of Systems  Constitutional: Negative.   HENT: Negative.   Eyes: Negative.   Respiratory: Negative.   Cardiovascular: Negative.   Gastrointestinal: Negative.   Genitourinary: Negative.   Skin: Negative.   Neurological: Negative.   Psychiatric/Behavioral: Negative.       Physical Exam Blood pressure 133/61, pulse (!) 104, temperature 98.2 F (36.8 C), temperature source Oral, height 5\' 8"  (1.727 m), weight 204 lb (92.5 kg), SpO2 99 %. Last Weight  Most recent update: 08/11/2020  1:54 PM   Weight  92.5 kg (204 lb)            CONSTITUTIONAL: Well  developed, and nourished, appropriately responsive and aware without distress.   EYES: Sclera non-icteric.   EARS, NOSE, MOUTH AND THROAT: Mask worn.   Hearing is intact to voice.  NECK: Trachea is midline, and there is no jugular venous distension.  LYMPH NODES:  Lymph nodes in the neck are not enlarged. RESPIRATORY:  Lungs are clear, and breath sounds are equal bilaterally. Normal respiratory effort without pathologic use of accessory muscles. CARDIOVASCULAR: Heart is regular in rate and rhythm. GI: The abdomen is  soft, nontender, and nondistended. There were no palpable masses. I did  not appreciate hepatosplenomegaly.   MUSCULOSKELETAL:  Symmetrical muscle tone appreciated in all four extremities.    SKIN: There is an indurated less than 2 cm mass to the left of midline in the suprapubic skin, hair covered skin.  Difficult to appreciate any fluctuance due to the tenderness and the surrounding induration.  Skin turgor is normal. No pathologic skin lesions appreciated.  NEUROLOGIC:  Motor and sensation appear grossly normal.  Cranial nerves are grossly without defect. PSYCH:  Alert and oriented to person, place and time. Affect is appropriate for situation.  Data Reviewed I have personally reviewed what is currently available of the patient's imaging, recent labs and medical records.   Labs:  CBC Latest Ref Rng & Units 08/10/2020 02/11/2020 08/08/2019  WBC 4.0 - 10.5 K/uL 8.3 6.6 6.5  Hemoglobin 12.0 - 15.0 g/dL 13.5 14.0 13.2  Hematocrit 36.0 - 46.0 % 41.3 43.3 41.6  Platelets 150 - 400 K/uL 307 233 209   CMP Latest Ref Rng & Units 08/10/2020 07/24/2020 02/11/2020  Glucose 70 - 99 mg/dL 297(H) 107(H) 130(H)  BUN 6 - 20 mg/dL 17 8 14   Creatinine 0.44 - 1.00 mg/dL 0.90 0.69 0.82  Sodium 135 - 145 mmol/L 134(L) 141 139  Potassium 3.5 - 5.1 mmol/L 3.7 3.7 3.5  Chloride 98 - 111 mmol/L 101 103 104  CO2 22 - 32 mmol/L 21(L) 27 24  Calcium 8.9 - 10.3 mg/dL 9.2 9.4 9.0  Total Protein 6.5 - 8.1  g/dL 7.7 - 7.6  Total Bilirubin 0.3 - 1.2 mg/dL 0.5 - 0.4  Alkaline Phos 38 - 126 U/L 79 - 83  AST 15 - 41 U/L 25 - 23  ALT 0 - 44 U/L 21 - 33      Imaging:  Within last 24 hrs: No results found.  Assessment     Inflamed/abscessed dermal cyst. Patient Active Problem List   Diagnosis Date Noted  . Osteopenia of lumbar spine 07/19/2020  . Breast pain, right 07/03/2020  . Screening for condition 06/10/2020  . Smoking history 02/11/2020  . Aortic atherosclerosis ([place]) 02/06/2020  . Tobacco use disorder, continuous 01/14/2020  . Helicobacter pylori gastritis 06/03/2019  . Abdominal bloating   . Nausea   . Secondary and unspecified malignant neoplasm of axilla and upper limb lymph nodes ([place]) 02/18/2019  . Lumbosacral spondylosis without myelopathy 11/22/2018  . Vertigo 10/09/2018  . Acute midline low back pain without sciatica 10/09/2018  . Hyperlipidemia associated with type 2 diabetes mellitus ([place]) 04/11/2018  . Gastroparesis 04/11/2018  . Long term (current) use of aromatase inhibitors 12/25/2017  . Arthritis   . Elevated LFTs   . Positive PPD, treated   . Umbilical hernia without obstruction and without gangrene 07/13/2016  . Rectal polyp   . Irritable bowel syndrome with both constipation and diarrhea 07/20/2015  . Type II diabetes mellitus with complication ([place]) 09/98/3382  . Pre-ulcerative calluses 06/22/2015  . Neuropathy 06/22/2015  . Hot flash, menopausal 04/09/2015  . Essential (primary) hypertension 03/25/2015  . Osteoporosis of lumbar spine 03/12/2015  . Malignant neoplasm of left breast in female, estrogen receptor positive ([place]) 05/30/2014  . 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  With topical prep with chloraseptic, we then injected into the cyst the 1 mL volume, this resulted in some spontaneous drainage from the punctum of  sebum.  She tolerated  the injection well.  We then sent a prescription for Keflex 100 mg p.o. 4 times daily for 10 days.  She can follow up with [place] as needed or in the next 4 to 6 weeks.  Prior to her departure she reported feeling significantly better.  Face-to-face time spent with the patient and accompanying care providers(if present) was 30 minutes, with more than 50% of the time spent counseling, educating, and coordinating care of the patient.      Ronny Bacon M.D., FACS 08/11/2020, 2:32 PM

## 2020-08-11 NOTE — Patient Instructions (Addendum)
You were give 20 mL of Kenalog into left groin area.   If you have any concerns or questions, please feel free to call our office. Please pick up prescriptions at your pharmacy.

## 2020-08-12 ENCOUNTER — Other Ambulatory Visit: Payer: Self-pay

## 2020-08-12 ENCOUNTER — Other Ambulatory Visit
Admission: RE | Admit: 2020-08-12 | Discharge: 2020-08-12 | Disposition: A | Discharge status: Home / Self Care | Payer: Medicare Other | Source: Ambulatory Visit | Attending: Gastroenterology | Admitting: Gastroenterology

## 2020-08-12 DIAGNOSIS — Z20822 Contact with and (suspected) exposure to covid-19: Secondary | ICD-10-CM | POA: Insufficient documentation

## 2020-08-12 DIAGNOSIS — Z01812 Encounter for preprocedural laboratory examination: Secondary | ICD-10-CM | POA: Diagnosis not present

## 2020-08-12 LAB — SARS CORONAVIRUS 2 (TAT 6-24 HRS): SARS Coronavirus 2: NEGATIVE

## 2020-08-13 ENCOUNTER — Encounter: Payer: Self-pay | Admitting: Gastroenterology

## 2020-08-13 DIAGNOSIS — E118 Type 2 diabetes mellitus with unspecified complications: Secondary | ICD-10-CM | POA: Diagnosis not present

## 2020-08-13 DIAGNOSIS — E782 Mixed hyperlipidemia: Secondary | ICD-10-CM | POA: Diagnosis not present

## 2020-08-13 DIAGNOSIS — I10 Essential (primary) hypertension: Secondary | ICD-10-CM | POA: Diagnosis not present

## 2020-08-13 DIAGNOSIS — I25118 Atherosclerotic heart disease of native coronary artery with other forms of angina pectoris: Secondary | ICD-10-CM | POA: Diagnosis not present

## 2020-08-13 DIAGNOSIS — I70219 Atherosclerosis of native arteries of extremities with intermittent claudication, unspecified extremity: Secondary | ICD-10-CM | POA: Diagnosis not present

## 2020-08-13 DIAGNOSIS — I700 Atherosclerosis of aorta: Secondary | ICD-10-CM | POA: Diagnosis not present

## 2020-08-14 ENCOUNTER — Encounter: Payer: Self-pay | Admitting: Gastroenterology

## 2020-08-14 ENCOUNTER — Encounter
Admission: RE | Disposition: A | Discharge status: Home / Self Care | Payer: Self-pay | Source: Home / Self Care | Attending: Gastroenterology

## 2020-08-14 ENCOUNTER — Other Ambulatory Visit: Payer: Self-pay

## 2020-08-14 ENCOUNTER — Ambulatory Visit: Payer: Medicare Other | Admitting: Certified Registered"

## 2020-08-14 ENCOUNTER — Ambulatory Visit
Admission: RE | Admit: 2020-08-14 | Discharge: 2020-08-14 | Disposition: A | Discharge status: Home / Self Care | Payer: Medicare Other | Attending: Gastroenterology | Admitting: Gastroenterology

## 2020-08-14 DIAGNOSIS — Z1211 Encounter for screening for malignant neoplasm of colon: Secondary | ICD-10-CM | POA: Insufficient documentation

## 2020-08-14 DIAGNOSIS — Z853 Personal history of malignant neoplasm of breast: Secondary | ICD-10-CM | POA: Diagnosis not present

## 2020-08-14 DIAGNOSIS — Z8719 Personal history of other diseases of the digestive system: Secondary | ICD-10-CM | POA: Diagnosis not present

## 2020-08-14 DIAGNOSIS — F17210 Nicotine dependence, cigarettes, uncomplicated: Secondary | ICD-10-CM | POA: Insufficient documentation

## 2020-08-14 DIAGNOSIS — Z7984 Long term (current) use of oral hypoglycemic drugs: Secondary | ICD-10-CM | POA: Diagnosis not present

## 2020-08-14 DIAGNOSIS — D126 Benign neoplasm of colon, unspecified: Secondary | ICD-10-CM | POA: Diagnosis not present

## 2020-08-14 DIAGNOSIS — Z7982 Long term (current) use of aspirin: Secondary | ICD-10-CM | POA: Insufficient documentation

## 2020-08-14 DIAGNOSIS — Z800 Family history of malignant neoplasm of digestive organs: Secondary | ICD-10-CM | POA: Insufficient documentation

## 2020-08-14 DIAGNOSIS — K639 Disease of intestine, unspecified: Secondary | ICD-10-CM

## 2020-08-14 DIAGNOSIS — Z803 Family history of malignant neoplasm of breast: Secondary | ICD-10-CM | POA: Insufficient documentation

## 2020-08-14 DIAGNOSIS — Z808 Family history of malignant neoplasm of other organs or systems: Secondary | ICD-10-CM | POA: Diagnosis not present

## 2020-08-14 DIAGNOSIS — K6389 Other specified diseases of intestine: Secondary | ICD-10-CM | POA: Diagnosis not present

## 2020-08-14 DIAGNOSIS — Z830 Family history of human immunodeficiency virus [HIV] disease: Secondary | ICD-10-CM | POA: Diagnosis not present

## 2020-08-14 DIAGNOSIS — Z86018 Personal history of other benign neoplasm: Secondary | ICD-10-CM | POA: Diagnosis not present

## 2020-08-14 DIAGNOSIS — Z9221 Personal history of antineoplastic chemotherapy: Secondary | ICD-10-CM | POA: Insufficient documentation

## 2020-08-14 DIAGNOSIS — Z923 Personal history of irradiation: Secondary | ICD-10-CM | POA: Diagnosis not present

## 2020-08-14 DIAGNOSIS — D124 Benign neoplasm of descending colon: Secondary | ICD-10-CM | POA: Diagnosis not present

## 2020-08-14 DIAGNOSIS — Z79899 Other long term (current) drug therapy: Secondary | ICD-10-CM | POA: Insufficient documentation

## 2020-08-14 DIAGNOSIS — D3A8 Other benign neuroendocrine tumors: Secondary | ICD-10-CM

## 2020-08-14 DIAGNOSIS — D122 Benign neoplasm of ascending colon: Secondary | ICD-10-CM | POA: Diagnosis not present

## 2020-08-14 DIAGNOSIS — Z86010 Personal history of colonic polyps: Secondary | ICD-10-CM | POA: Diagnosis not present

## 2020-08-14 DIAGNOSIS — C7A8 Other malignant neuroendocrine tumors: Secondary | ICD-10-CM | POA: Diagnosis not present

## 2020-08-14 DIAGNOSIS — K635 Polyp of colon: Secondary | ICD-10-CM | POA: Diagnosis not present

## 2020-08-14 HISTORY — PX: COLONOSCOPY WITH PROPOFOL: SHX5780

## 2020-08-14 HISTORY — DX: Other benign neuroendocrine tumors: D3A.8

## 2020-08-14 LAB — GLUCOSE, CAPILLARY: Glucose-Capillary: 133 mg/dL — ABNORMAL HIGH (ref 70–99)

## 2020-08-14 SURGERY — COLONOSCOPY WITH PROPOFOL
Anesthesia: General

## 2020-08-14 MED ORDER — LIDOCAINE 2% (20 MG/ML) 5 ML SYRINGE
INTRAMUSCULAR | Status: DC | PRN
  Administered 2020-08-14: 25 mg via INTRAVENOUS

## 2020-08-14 MED ORDER — SODIUM CHLORIDE 0.9 % IV SOLN: INTRAVENOUS | Status: DC

## 2020-08-14 MED ORDER — MIDAZOLAM HCL 2 MG/2ML IJ SOLN
INTRAMUSCULAR | Status: AC
  Filled 2020-08-14: qty 2

## 2020-08-14 MED ORDER — PROPOFOL 10 MG/ML IV BOLUS
INTRAVENOUS | Status: DC | PRN
  Administered 2020-08-14: 70 mg via INTRAVENOUS

## 2020-08-14 MED ORDER — MIDAZOLAM HCL 5 MG/5ML IJ SOLN
INTRAMUSCULAR | Status: DC | PRN
  Administered 2020-08-14: 2 mg via INTRAVENOUS

## 2020-08-14 MED ORDER — PROPOFOL 500 MG/50ML IV EMUL
INTRAVENOUS | Status: DC | PRN
  Administered 2020-08-14: 120 ug/kg/min via INTRAVENOUS

## 2020-08-14 NOTE — Anesthesia Preprocedure Evaluation (Signed)
Anesthesia Evaluation  Patient identified by MRN, date of birth, ID band Patient awake    Reviewed: Allergy & Precautions, H&P , NPO status , Patient's Chart, lab work & pertinent test results  History of Anesthesia Complications Negative for: history of anesthetic complications  Airway Mallampati: II  TM Distance: >3 FB     Dental  (+) Edentulous Upper   Pulmonary neg sleep apnea, neg COPD, Current Smoker,    breath sounds clear to auscultation       Cardiovascular hypertension, (-) angina(-) Past MI and (-) Cardiac Stents (-) dysrhythmias  Rhythm:regular Rate:Normal     Neuro/Psych negative neurological ROS  negative psych ROS   GI/Hepatic Neg liver ROS, GERD  Controlled,  Endo/Other  diabetes  Renal/GU negative Renal ROS  negative genitourinary   Musculoskeletal   Abdominal   Peds  Hematology negative hematology ROS (+)   Anesthesia Other Findings Past Medical History: No date: Arthritis     Comment:  shoulders and neck 2016: Breast cancer ([facility])     Comment:   Left breast with chemo + rad tx's with lumpectomy. 06/23/2016: Breast wound, left, sequela 06/2014: Diabetes mellitus without complication ([place]) No date: Elevated LFTs 08/01/2016: Fatigue No date: GERD (gastroesophageal reflux disease) No date: Gout 01/23/2015: Hydradenitis No date: Hyperlipidemia No date: Hypertension 10/15/2016: Mastitis 2016: Personal history of chemotherapy     Comment:  left breast ca 2016: Personal history of radiation therapy     Comment:  LEFT lumpectomy No date: Personal history of radiation therapy No date: Positive PPD, treated No date: Shortness of breath dyspnea     Comment:  with exertion  Past Surgical History: 07/31/2014: BREAST BIOPSY; Left     Comment:  [facility]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  03/08/2016: BREAST BIOPSY; Left     Comment:  INTRADUCTAL PAPILLOMA  07/2014: BREAST LUMPECTOMY; Left     Comment:  [facility], DCIS, clear margins,  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 04/05/2016: BREAST LUMPECTOMY; Left     Comment:  excision of intraductal papilloma, no malignancy or               atypia 04/05/2016: BREAST LUMPECTOMY WITH NEEDLE LOCALIZATION; Left     Comment:  Procedure: BREAST LUMPECTOMY WITH NEEDLE LOCALIZATION;                Surgeon: Hubbard Robinson, MD;  Location: [facility] ORS;                Service: General;  Laterality: Left; 08/27/14: BREAST LUMPECTOMY WITH NEEDLE LOCALIZATION AND AXILLARY  SENTINEL LYMPH NODE BX; Left 09/07/2013: CHOLECYSTECTOMY 08/28/2015: COLONOSCOPY WITH PROPOFOL; N/A     Comment:  Procedure: COLONOSCOPY WITH PROPOFOL;  Surgeon: Lucilla Lame, MD;  Location: [place];  Service:               Endoscopy;  Laterality: N/A;  Diabetic oral 2014: ESOPHAGOGASTRODUODENOSCOPY     Comment:  gastritis 04/16/2019: ESOPHAGOGASTRODUODENOSCOPY (EGD) WITH PROPOFOL; N/A     Comment:  Procedure: ESOPHAGOGASTRODUODENOSCOPY (EGD) WITH               PROPOFOL;  Surgeon: Virgel Manifold, MD;  Location:               [facility] ENDOSCOPY;  Service: Endoscopy;  Laterality: N/A; 10/03/2019: ESOPHAGOGASTRODUODENOSCOPY (EGD) WITH PROPOFOL; N/A     Comment:  Procedure: ESOPHAGOGASTRODUODENOSCOPY (EGD) WITH  PROPOFOL;  Surgeon: Virgel Manifold, MD;  Location:               [facility] ENDOSCOPY;  Service: Endoscopy;  Laterality: N/A; 08/28/2015: POLYPECTOMY     Comment:  Procedure: POLYPECTOMY INTESTINAL;  Surgeon: Lucilla Lame, MD;  Location: [place];  Service:               Endoscopy;;  Rectal polyp No date: TUBAL LIGATION; Bilateral 9/56/3875: UMBILICAL HERNIA REPAIR; N/A     Comment:  Procedure: HERNIA REPAIR UMBILICAL ADULT;  Surgeon: Olean Ree, MD;  Location: [facility];  Service: General;                Laterality: N/A; 04/10/2015: VENTRAL HERNIA REPAIR; N/A     Comment:  Procedure: HERNIA REPAIR VENTRAL ADULT;  Surgeon: Leonie Green, MD;   Location: [facility];  Service: General;              Laterality: N/A;  BMI    Body Mass Index: 30.56 kg/m      Reproductive/Obstetrics negative OB ROS                             Anesthesia Physical Anesthesia Plan  ASA: II  Anesthesia Plan: General   Post-op Pain Management:    Induction:   PONV Risk Score and Plan: Propofol infusion and TIVA  Airway Management Planned: Nasal Cannula  Additional Equipment:   Intra-op Plan:   Post-operative Plan:   Informed Consent: I have reviewed the patients History and Physical, chart, labs and discussed the procedure including the risks, benefits and alternatives for the proposed anesthesia with the patient or authorized representative who has indicated his/her understanding and acceptance.     Dental Advisory Given  Plan Discussed with: Anesthesiologist, CRNA and Surgeon  Anesthesia Plan Comments:         Anesthesia Quick Evaluation

## 2020-08-14 NOTE — Op Note (Addendum)
[facility] Gastroenterology Patient Name: Donna Riley Procedure Date: 08/14/2020 8:30 AM MRN: [id number] Account #: [id number] Date of Birth: 10-Jul-1960 Admit Type: Outpatient Age: 60 Room: [facility] ENDO ROOM 2 Gender: Female Note Status: Supervisor Override Procedure:             Colonoscopy Indications:           High risk colon cancer surveillance: Personal history                         of colonic polyps Providers:             Emmilyn Crooke B. Bonna Gains MD, MD Medicines:             Monitored Anesthesia Care Complications:         No immediate complications. Procedure:             Pre-Anesthesia Assessment:                        - ASA Grade Assessment: II - A patient with mild                         systemic disease.                        - Prior to the procedure, a History and Physical was                         performed, and patient medications, allergies and                         sensitivities were reviewed. The patient's tolerance                         of previous anesthesia was reviewed.                        - The risks and benefits of the procedure and the                         sedation options and risks were discussed with the                         patient. All questions were answered and informed                         consent was obtained.                        - Patient identification and proposed procedure were                         verified prior to the procedure by the physician, the                         nurse, the anesthesiologist, the anesthetist and the                         technician. The procedure was verified in the  procedure room.                        After obtaining informed consent, the colonoscope was                         passed under direct vision. Throughout the procedure,                         the patient's blood pressure, pulse, and oxygen                         saturations were  monitored continuously. The                         Colonoscope was introduced through the anus and                         advanced to the the cecum, identified by appendiceal                         orifice and ileocecal valve. The colonoscopy was                         performed with ease. The patient tolerated the                         procedure well. The quality of the bowel preparation                         was good. Findings:      The perianal and digital rectal examinations were normal.      A 7 mm polyp was found in the ascending colon. The polyp was sessile.       The polyp was removed with a cold snare. Resection and retrieval were       complete.      Three sessile polyps were found in the descending colon. The polyps were       4 to 6 mm in size. These polyps were removed with a cold snare.       Resection and retrieval were complete.      A 3 mm polyp was found in the sigmoid colon. The polyp was sessile. The       polyp was removed with a cold biopsy forceps. Resection and retrieval       were complete.      One 6 mm nodule was found in the sigmoid colon. Biopsies were taken with       a cold forceps for histology. For hemostasis, two hemostatic clips were       successfully placed. There was no bleeding at the end of the procedure.      The exam was otherwise without abnormality.      The rectum, sigmoid colon, descending colon, transverse colon, ascending       colon and cecum appeared normal.      The retroflexed view of the distal rectum and anal verge was normal and       showed no anal or rectal abnormalities. Impression:            - One 7 mm polyp in the ascending colon, removed  with                         a cold snare. Resected and retrieved.                        - Three 4 to 6 mm polyps in the descending colon,                         removed with a cold snare. Resected and retrieved.                        - One 3 mm polyp in the sigmoid colon, removed  with a                         cold biopsy forceps. Resected and retrieved.                        - Nodule in the sigmoid colon. Biopsied. Clips were                         placed.                        - The examination was otherwise normal.                        - The rectum, sigmoid colon, descending colon,                         transverse colon, ascending colon and cecum are normal.                        - The distal rectum and anal verge are normal on                         retroflexion view. Recommendation:        - Discharge patient to home (with escort).                        - Advance diet as tolerated.                        - Continue present medications.                        - Await pathology results.                        - Repeat colonoscopy date to be determined after                         pending pathology results are reviewed.                        - The findings and recommendations were discussed with                         the patient.                        -  The findings and recommendations were discussed with                         the patient's family.                        - Return to primary care physician as previously                         scheduled. Procedure Code(s):     --- Professional ---                        [phone], 59, Colonoscopy, flexible; with control of                         bleeding, any method                        45385, Colonoscopy, flexible; with removal of                         tumor(s), polyp(s), or other lesion(s) by snare                         technique                        45380, 43, Colonoscopy, flexible; with biopsy, single                         or multiple Diagnosis Code(s):     --- Professional ---                        K63.5, Polyp of colon                        Z86.010, Personal history of colonic polyps CPT copyright 2019 American Medical Association. All rights reserved. The codes documented in  this report are preliminary and upon coder review may  be revised to meet current compliance requirements.  Vonda Antigua, MD Margretta Sidle B. Bonna Gains MD, MD 08/14/2020 9:12:33 AM This report has been signed electronically. Number of Addenda: 0 Note Initiated On: 08/14/2020 8:30 AM Scope Withdrawal Time: 0 hours 20 minutes 19 seconds  Total Procedure Duration: 0 hours 22 minutes 45 seconds  Estimated Blood Loss:  Estimated blood loss: none.      [facility]

## 2020-08-14 NOTE — Transfer of Care (Signed)
Immediate Anesthesia Transfer of Care Note  Patient: Donna Riley  Procedure(s) Performed: COLONOSCOPY WITH PROPOFOL (N/A )  Patient Location: Endoscopy Unit  Anesthesia Type:General  Level of Consciousness: drowsy  Airway & Oxygen Therapy: Patient Spontanous Breathing  Post-op Assessment: Report given to RN and Post -op Vital signs reviewed and stable  Post vital signs: Reviewed  Last Vitals:  Vitals Value Taken Time  BP 107/81 08/14/20 0912  Temp    Pulse 88 08/14/20 0912  Resp 22 08/14/20 0912  SpO2 94 % 08/14/20 0912  Vitals shown include unvalidated device data.  Last Pain:  Vitals:   08/14/20 0808  TempSrc: Temporal  PainSc: 0-No pain         Complications: No complications documented.

## 2020-08-14 NOTE — H&P (Signed)
Vonda Antigua, MD [address] [place], [place], [place], [address] [address], [place], [place], [place], [address] Phone: [phone]  Fax: [phone]  Primary Care Physician:  Glean Hess, MD   Pre-Procedure History & Physical: HPI:  Donna Riley is a 60 y.o. female is here for a colonoscopy.   Past Medical History:  Diagnosis Date  . Arthritis    shoulders and neck  . Breast cancer ([place]) 2016    Left breast with chemo + rad tx's with lumpectomy.  . Breast wound, left, sequela 06/23/2016  . Diabetes mellitus without complication ([place]) 11/9022  . Elevated LFTs   . Fatigue 08/01/2016  . GERD (gastroesophageal reflux disease)   . Gout   . Hydradenitis 01/23/2015  . Hyperlipidemia   . Hypertension   . Mastitis 10/15/2016  . Personal history of chemotherapy 2016   left breast ca  . Personal history of radiation therapy 2016   LEFT lumpectomy  . Personal history of radiation therapy   . Positive PPD, treated   . Shortness of breath dyspnea    with exertion    Past Surgical History:  Procedure Laterality Date  . BREAST BIOPSY Left 07/31/2014   [facility] and DCIS   . BREAST BIOPSY Left 03/08/2016   INTRADUCTAL PAPILLOMA   . 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 BREAST LUMPECTOMY Left 04/05/2016   excision of intraductal papilloma, no malignancy or atypia  . BREAST LUMPECTOMY WITH NEEDLE LOCALIZATION Left 04/05/2016   Procedure: BREAST LUMPECTOMY WITH NEEDLE LOCALIZATION;  Surgeon: Hubbard Robinson, MD;  Location: [facility] OR[facility];  Service: General;  Laterality: Left;  . BREAST LUMPECTOMY WITH NEEDLE LOCALIZATION AND AXILLARY SENTINEL LYMPH NODE BX Left 08/27/14  . CHOLECYSTECTOMY  09/07/2013  . COLONOSCOPY WITH PROPOFOL N/A 08/28/2015   Procedure: COLONOSCOPY WITH PROPOFOL;  Surgeon: Lucilla Lame, MD;  Location: [place];  Service: Endoscopy;  Laterality: N/A;  Diabetic oral  . ESOPHAGOGASTRODUODENOSCOPY  2014    gastritis  . ESOPHAGOGASTRODUODENOSCOPY (EGD) WITH PROPOFOL N/A 04/16/2019   Procedure: ESOPHAGOGASTRODUODENOSCOPY (EGD) WITH PROPOFOL;  Surgeon: Virgel Manifold, MD;  Location: [facility];  Service: Endoscopy;  Laterality: N/A;  . ESOPHAGOGASTRODUODENOSCOPY (EGD) WITH PROPOFOL N/A 10/03/2019   Procedure: ESOPHAGOGASTRODUODENOSCOPY (EGD) WITH PROPOFOL;  Surgeon: Virgel Manifold, MD;  Location: [facility];  Service: Endoscopy;  Laterality: N/A;  . POLYPECTOMY  08/28/2015   Procedure: POLYPECTOMY INTESTINAL;  Surgeon: Lucilla Lame, MD;  Location: [place];  Service: Endoscopy;;  Rectal polyp  . TUBAL LIGATION Bilateral   . UMBILICAL HERNIA REPAIR N/A 08/09/2016   Procedure: HERNIA REPAIR UMBILICAL ADULT;  Surgeon: Olean Ree, MD;  Location: [facility];  Service: General;  Laterality: N/A;  . VENTRAL HERNIA REPAIR N/A 04/10/2015   Procedure: HERNIA REPAIR VENTRAL ADULT;  Surgeon: [place]ie Green, MD;  Location: [facility];  Service: General;  Laterality: N/A;    Prior to Admission medications   Medication Sig Start Date End Date Taking? Authorizing Provider  amLODipine (NORVASC) 10 MG tablet TAKE TWO TABLETS BY MOUTH ONCE DAILY 03/31/20  Yes Glean Hess, MD  aspirin EC 81 MG tablet Take 81 mg by mouth daily.   Yes [provider]  atorvastatin (LIPITOR) 10 MG tablet Take 1 tablet (10 mg total) by mouth daily. 07/24/20  Yes Glean Hess, MD  cephALEXin (KEFLEX) 500 MG capsule Take 1 capsule (500 mg total) by mouth 4 (four) times daily for 10 days. 08/11/20  08/21/20 Yes Ronny Bacon, MD  hydrochlorothiazide (HYDRODIURIL) 25 MG tablet Take 25 mg by mouth daily.    Yes [provider]  irbesartan (AVAPRO) 300 MG tablet TAKE ONE TABLET BY MOUTH ONCE DAILY 07/09/20  Yes Glean Hess, MD  metFORMIN (GLUCOPHAGE-XR) 500 MG 24 hr tablet TAKE ONE TABLET BY MOUTH EVERY MORNING WITH BREAKFAST Patient taking differently: daily. 07/10/20  Yes Glean Hess, MD  acetaminophen (TYLENOL) 325 MG tablet Take 650 mg by mouth daily as needed. 09/08/13   [provider]  Alcohol Swabs (B-D SINGLE USE SWABS REGULAR) PADS 1 each by Does not apply route 2 (two) times daily. 11/22/17   Glean Hess, MD  Blood Glucose Calibration (TRUE METRIX LEVEL 1) Low SOLN 1 each by In Vitro route daily as needed. 11/22/17   Glean Hess, MD  Blood Glucose Monitoring Suppl (TRUE METRIX METER) DEVI 1 each by Does not apply route daily. 12/15/17   Glean Hess, MD  calcium-vitamin D (OSCAL WITH D) 500-200 MG-UNIT TABS tablet Take by mouth daily.    [provider]  HYDROcodone-acetaminophen (NORCO) 10-325 MG tablet Take 1 tablet by mouth every 6 (six) hours as needed for severe pain. 08/11/20 08/11/21  Ronny Bacon, MD    Allergies as of 07/15/2020  . (No Known Allergies)    Family History  Problem Relation Age of Onset  . Cancer Mother        Pancreatic  . Cancer Father        Throat  . Healthy Sister   . Cancer Sister        liver  . HIV Brother   . Cancer Maternal Grandmother        Breast  . Breast cancer Maternal Grandmother 75    Social History   Socioeconomic History  . Marital status: Widowed    Spouse name: Not on file  . Number of children: 4  . Years of education: Not on file  . Highest education level: 10th grade  Occupational History  . Occupation: Disabled  Tobacco Use  . Smoking status: Current Every Day Smoker    Packs/day: 0.50    Years: 44.00    Pack years: 22.00    Types: Cigarettes  . Smokeless tobacco: Never Used  Vaping Use  . Vaping Use: Never used  Substance and Sexual Activity  . Alcohol use: Yes    Alcohol/week: 2.0 standard drinks    Types: 2 Cans of beer per week    Comment: Occasionally .beer yesterday morning  . Drug use: No  . Sexual activity: Not Currently  Other Topics Concern  . Not on file  Social History Narrative   Pt lives alone, son lives 2 doors down.   Social  Determinants of Health   Financial Resource Strain: Low Risk   . Difficulty of Paying Living Expenses: Not very hard  Food Insecurity: No Food Insecurity  . Worried About Charity fundraiser in the Last Year: Never true  . Ran Out of Food in the Last Year: Never true  Transportation Needs: No Transportation Needs  . Lack of Transportation (Medical): No  . Lack of Transportation (Non-Medical): No  Physical Activity: Inactive  . Days of Exercise per Week: 0 days  . Minutes of Exercise per Session: 0 min  Stress: No Stress Concern Present  . Feeling of Stress : Only a little  Social Connections: Moderately Isolated  . Frequency of Communication with Friends and Family: More  than three times a week  . Frequency of Social Gatherings with Friends and Family: Three times a week  . Attends Religious Services: More than 4 times per year  . Active Member of Clubs or Organizations: No  . Attends Archivist Meetings: Never  . Marital Status: Widowed  Intimate Partner Violence: Not At Risk  . Fear of Current or Ex-Partner: No  . Emotionally Abused: No  . Physically Abused: No  . Sexually Abused: No    Review of Systems: See HPI, otherwise negative ROS  Physical Exam: BP (!) 177/76   Pulse 92   Temp 97.9 F (36.6 C) (Temporal)   Resp (!) 22   Ht 5\' 8"  (1.727 m)   Wt 91.2 kg   SpO2 99%   BMI 30.56 kg/m  General:   Alert,  pleasant and cooperative in NAD Head:  Normocephalic and atraumatic. Neck:  Supple; no masses or thyromegaly. Lungs:  Clear throughout to auscultation, normal respiratory effort.    Heart:  +S1, +S2, Regular rate and rhythm, No edema. Abdomen:  Soft, nontender and nondistended. Normal bowel sounds, without guarding, and without rebound.   Neurologic:  Alert and  oriented x4;  grossly normal neurologically.  Impression/Plan: SHALIA BARTKO is here for a colonoscopy to be performed for history of adenoma polyps in 2017  Risks, benefits, limitations,  and alternatives regarding  colonoscopy have been reviewed with the patient.  Questions have been answered.  All parties agreeable.   Virgel Manifold, MD  08/14/2020, 8:34 AM

## 2020-08-16 NOTE — Anesthesia Postprocedure Evaluation (Signed)
Anesthesia Post Note  Patient: Donna Riley  Procedure(s) Performed: COLONOSCOPY WITH PROPOFOL (N/A )  Patient location during evaluation: PACU Anesthesia Type: General Level of consciousness: awake and alert Pain management: pain level controlled Vital Signs Assessment: post-procedure vital signs reviewed and stable Respiratory status: spontaneous breathing, nonlabored ventilation and respiratory function stable Cardiovascular status: blood pressure returned to baseline and stable Postop Assessment: no apparent nausea or vomiting Anesthetic complications: no   No complications documented.   Last Vitals:  Vitals:   08/14/20 0921 08/14/20 0931  BP: (!) 144/82 (!) 142/67  Pulse: 80 74  Resp: (!) 22 (!) 24  Temp:    SpO2: 94% 96%    Last Pain:  Vitals:   08/14/20 0931  TempSrc:   PainSc: 0-No pain                 Tera Mater

## 2020-08-18 ENCOUNTER — Other Ambulatory Visit: Payer: Self-pay | Admitting: Gastroenterology

## 2020-08-18 LAB — SURGICAL PATHOLOGY

## 2020-08-19 ENCOUNTER — Telehealth: Payer: Self-pay

## 2020-08-19 ENCOUNTER — Other Ambulatory Visit: Payer: Self-pay | Admitting: Hematology and Oncology

## 2020-08-19 ENCOUNTER — Telehealth: Payer: Self-pay | Admitting: Gastroenterology

## 2020-08-19 NOTE — Telephone Encounter (Signed)
Spoke to pt and can see where she put in a call to Dr. Bonna Gains today. The message was delivered to Dr Bonna Gains, so I told the pt. she should be expecting a call.

## 2020-08-19 NOTE — Telephone Encounter (Signed)
Patient called LVM asking for call back to explain results from procedure on 08/14/20.

## 2020-08-19 NOTE — Telephone Encounter (Signed)
Copied from [place] [id number]. Topic: General - Other >> Aug 19, 2020 11:21 AM Celene Kras wrote: Reason for CRM: Pt called and is requesting to have a call back from nurse. She states that she would like to have her results back from her colonoscopy. Please advise.

## 2020-08-21 ENCOUNTER — Other Ambulatory Visit: Payer: Self-pay | Admitting: Internal Medicine

## 2020-08-21 DIAGNOSIS — I10 Essential (primary) hypertension: Secondary | ICD-10-CM

## 2020-08-21 NOTE — Telephone Encounter (Signed)
Dr. Bonna Gains stated that she would like to see the patient on Monday. Therefore, patient was contacted and will be seen and Dr. Bonna Gains will explain her results.

## 2020-08-24 ENCOUNTER — Other Ambulatory Visit: Payer: Self-pay | Admitting: Hematology and Oncology

## 2020-08-24 ENCOUNTER — Ambulatory Visit (INDEPENDENT_AMBULATORY_CARE_PROVIDER_SITE_OTHER): Payer: Medicare Other | Admitting: Gastroenterology

## 2020-08-24 ENCOUNTER — Other Ambulatory Visit: Payer: Self-pay

## 2020-08-24 ENCOUNTER — Encounter: Payer: Self-pay | Admitting: Gastroenterology

## 2020-08-24 VITALS — BP 156/75 | HR 96 | Temp 97.4°F | Ht 68.0 in | Wt 203.0 lb

## 2020-08-24 DIAGNOSIS — D3A8 Other benign neuroendocrine tumors: Secondary | ICD-10-CM | POA: Diagnosis not present

## 2020-08-24 NOTE — Progress Notes (Signed)
Donna Antigua, MD [address]  [place]  [place], [place] [address]  Main: [phone]  Fax: [phone]   Primary Care Physician: Glean Hess, MD   Chief Complaint  Patient presents with  . Go over results    HPI: Donna Riley is a 60 y.o. female who recently underwent colonoscopy and here to discuss results.  Several subcentimeter polyps were seen and removed during her colonoscopy.  These show tubular adenoma.  A sigmoid colon nodule was seen and biopsied.  Clips were placed at the site after biopsy due to bleeding.  The nodule showed well-differentiated neuroendocrine tumor, grade 1.  Patient denies any skin rashes, telangiectasias.  Denies any flushing.  Does have chronic diarrhea.  Has had previous upper endoscopy showing H. pylori which was then treated and repeat upper endoscopies did not show any H. pylori.  Current Outpatient Medications  Medication Sig Dispense Refill  . acetaminophen (TYLENOL) 325 MG tablet Take 650 mg by mouth daily as needed.    . Alcohol Swabs (B-D SINGLE USE SWABS REGULAR) PADS 1 each by Does not apply route 2 (two) times daily. 100 each 3  . amLODipine (NORVASC) 10 MG tablet TAKE TWO TABLETS BY MOUTH ONCE DAILY 60 tablet 2  . aspirin EC 81 MG tablet Take 81 mg by mouth daily.    Marland Kitchen atorvastatin (LIPITOR) 10 MG tablet Take 1 tablet (10 mg total) by mouth daily. 90 tablet 3  . Blood Glucose Calibration (TRUE METRIX LEVEL 1) Low SOLN 1 each by In Vitro route daily as needed. 1 each 3  . Blood Glucose Monitoring Suppl (TRUE METRIX METER) DEVI 1 each by Does not apply route daily. 100 Device 3  . calcium-vitamin D (OSCAL WITH D) 500-200 MG-UNIT TABS tablet Take by mouth daily.    . hydrochlorothiazide (HYDRODIURIL) 25 MG tablet Take 25 mg by mouth daily.     Marland Kitchen HYDROcodone-acetaminophen (NORCO) 10-325 MG tablet Take 1 tablet by mouth every 6 (six) hours as needed for severe pain. 12 tablet 0  . irbesartan (AVAPRO) 300 MG tablet  TAKE ONE TABLET BY MOUTH ONCE DAILY 90 tablet 0  . metFORMIN (GLUCOPHAGE-XR) 500 MG 24 hr tablet TAKE ONE TABLET BY MOUTH EVERY MORNING WITH BREAKFAST (Patient taking differently: daily.) 90 tablet 0   No current facility-administered medications for this visit.    Allergies as of 08/24/2020  . (No Known Allergies)    ROS:  General: Negative for anorexia, weight loss, fever, chills, fatigue, weakness. ENT: Negative for hoarseness, difficulty swallowing , nasal congestion. CV: Negative for chest pain, angina, palpitations, dyspnea on exertion, peripheral edema.  Respiratory: Negative for dyspnea at rest, dyspnea on exertion, cough, sputum, wheezing.  GI: See history of present illness. GU:  Negative for dysuria, hematuria, urinary incontinence, urinary frequency, nocturnal urination.  Endo: Negative for unusual weight change.    Physical Examination:   BP (!) 156/75   Pulse 96   Temp (!) 97.4 F (36.3 C) (Oral)   Ht 5\' 8"  (1.727 m)   Wt 203 lb (92.1 kg)   BMI 30.87 kg/m   General: Well-nourished, well-developed in no acute distress.  Eyes: No icterus. Conjunctivae pink. Mouth: Oropharyngeal mucosa moist and pink , no lesions erythema or exudate. Neck: Supple, Trachea midline Abdomen: Bowel sounds are normal, nontender, nondistended, no hepatosplenomegaly or masses, no abdominal bruits or hernia , no rebound or guarding.   Extremities: No lower extremity edema. No clubbing or deformities. Neuro: Alert and oriented  x 3.  Grossly intact. Skin: Warm and dry, no jaundice.   Psych: Alert and cooperative, normal mood and affect.   Labs: CMP     Component Value Date/Time   NA 134 (L) 08/10/2020 0839   NA 145 (H) 05/26/2017 0928   NA 137 09/09/2014 1106   K 3.7 08/10/2020 0839   K 4.3 09/09/2014 1106   CL 101 08/10/2020 0839   CL 105 09/09/2014 1106   CO2 21 (L) 08/10/2020 0839   CO2 24 09/09/2014 1106   GLUCOSE 297 (H) 08/10/2020 0839   GLUCOSE 113 (H) 09/09/2014 1106    BUN 17 08/10/2020 0839   BUN 8 05/26/2017 0928   BUN 11 09/09/2014 1106   CREATININE 0.90 08/10/2020 0839   CREATININE 0.67 09/09/2014 1106   CALCIUM 9.2 08/10/2020 0839   CALCIUM 9.1 09/09/2014 1106   PROT 7.7 08/10/2020 0839   PROT 7.1 05/26/2017 0928   PROT 7.9 09/09/2014 1106   ALBUMIN 4.2 08/10/2020 0839   ALBUMIN 4.4 05/26/2017 0928   ALBUMIN 4.2 09/09/2014 1106   AST 25 08/10/2020 0839   AST 24 09/09/2014 1106   ALT 21 08/10/2020 0839   ALT 41 09/09/2014 1106   ALKPHOS 79 08/10/2020 0839   ALKPHOS 96 09/09/2014 1106   BILITOT 0.5 08/10/2020 0839   BILITOT <0.2 05/26/2017 0928   BILITOT 0.6 09/09/2014 1106   GFRNONAA >60 08/10/2020 0839   GFRNONAA >60 09/09/2014 1106   GFRAA >60 02/11/2020 0844   GFRAA >60 09/09/2014 1106   Lab Results  Component Value Date   WBC 8.3 08/10/2020   HGB 13.5 08/10/2020   HCT 41.3 08/10/2020   MCV 85.0 08/10/2020   PLT 307 08/10/2020    Imaging Studies: No results found.  Assessment and Plan:   Donna Riley is a 60 y.o. y/o female with previous history of H. pylori, treated in 2021, with recent colonoscopy showing a sigmoid colon nodule with biopsy showing well-differentiated neuroendocrine tumor, grade 1  This will need a multidisciplinary approach I have reached out to Dr. Rush Landmark, gastroenterologist in [place], and patient's oncologist Dr. Mike Gip  The next steps may be needing to tattoo the site where the biopsy was performed as it may be hard to localize once the clip falls off, given the small size of the nodule.  The question would be would Dr. Rush Landmark want to repeat a flex sig himself so he can review this site at the time of the procedure and possibly treat the site and not just had to it.  Or if it would be better for me to first tattoo it, and then him perform the removal later  This may also benefit from tumor board discussion and I have reached out to Dr. Mike Gip in that regard  We will continue to  follow the patient closely and update her after above discussions  Dr Donna Riley

## 2020-08-25 ENCOUNTER — Other Ambulatory Visit: Payer: Self-pay

## 2020-08-25 ENCOUNTER — Telehealth: Payer: Self-pay

## 2020-08-25 DIAGNOSIS — D3A8 Other benign neuroendocrine tumors: Secondary | ICD-10-CM

## 2020-08-25 MED ORDER — NA SULFATE-K SULFATE-MG SULF 17.5-3.13-1.6 GM/177ML PO SOLN: ORAL | 0 refills | Status: DC

## 2020-08-25 NOTE — Telephone Encounter (Signed)
Called patient but had to leave her a detailed message letting her know that [facility] and then she will have to call the endoscopy unit at [phone] between 1-3 PM. She was also informed to go to her pharmacy to pick up her Suprep solution and how to take it.

## 2020-08-25 NOTE — Telephone Encounter (Signed)
-----   Message from Irving Copas., MD sent at 08/25/2020  2:42 PM EDT ----- VT, thank you for update.  Cyril Woodmansee, Please reach out to patient and get her set up for a Lower EUS and possible EMR with DJ or myself in the next 6-8 weeks, this will be to ensure complete removal of the [facility] Neuroendocrine tumor that was found/biopsied off.  She will get tattooed by Dr. Bonna Gains later this week. If there is something within DJ or my schedule for next 1-2 weeks, we can go ahead and do the procedure ourselves and she may not need the flex sigmoidoscopy, but as I suspect we are scheduled out through May, then we should get her scheduled as able. Thanks. GM ----- Message ----- From: Virgel Manifold, MD Sent: 08/25/2020   2:41 PM EDT To: Lequita Asal, MD, #  Yes Im able to get her added on this Thursday for a flex sig and tattoo the site ----- Message ----- From: Irving Copas., MD Sent: 08/25/2020   1:22 PM EDT To: Lequita Asal, MD, #  VT, Patient certainly needs to have a lower EUS with potential EMR of the scar site.  Unfortunately for Ardis Hughs and myself, this will be at least 6 weeks out before either of [place] have availability.  If you have have earlier availability to fit that in it would be ideal to try to tattoo the area adjacent to it if you can still find it.  Please let me know what you all decide on tumor board discussion. If you all agree with this plan then let me know and then I will forward this to RN Gerarda Fraction and Dr. Ardis Hughs as well. Thanks. GM ----- Message ----- From: Lequita Asal, MD Sent: 08/24/2020   1:13 PM EDT To: Virgel Manifold, MD, #   I believe she has been added to tumor board.  M  ----- Message ----- From: Virgel Manifold, MD Sent: 08/24/2020  12:33 PM EDT To: Lequita Asal, MD, #  Hi Dr. Rush Landmark and Dr. Mike Gip,  I wanted to see if you could help me with this pt. I recently did a colonoscopy on 08/14/2020, 10 days  ago, and a sigmoid nodule that I biopsied turned out to be a Well differentiated NET Grade 1.  When I biopsied it, it did bleed so I placed clips there.  Once these clips fall, this area may be hard to localize, so the question is doing a flex sig to tattoo it so it can be identified on future procedures.  Question is, should I do the flex sig and tattoo it, and then send it to Dr. Rush Landmark for removal?  Or Dr. Rush Landmark, would you want to look at it yourself and therefore, do the flex sig yourself to tattoo it, and possibly remove it if the clips have fallen off and you can identify the site.  Dr. Mike Gip, this patient sees you in clinic already due to her previous history of breast cancer.  Would you want to add her to tumor board?

## 2020-08-26 ENCOUNTER — Other Ambulatory Visit
Admission: RE | Admit: 2020-08-26 | Discharge: 2020-08-26 | Disposition: A | Discharge status: Home / Self Care | Payer: Medicare Other | Source: Ambulatory Visit | Attending: Gastroenterology | Admitting: Gastroenterology

## 2020-08-26 ENCOUNTER — Encounter: Payer: Self-pay | Admitting: Gastroenterology

## 2020-08-26 ENCOUNTER — Other Ambulatory Visit: Payer: Self-pay

## 2020-08-26 DIAGNOSIS — Z01812 Encounter for preprocedural laboratory examination: Secondary | ICD-10-CM | POA: Insufficient documentation

## 2020-08-26 DIAGNOSIS — Z20822 Contact with and (suspected) exposure to covid-19: Secondary | ICD-10-CM | POA: Insufficient documentation

## 2020-08-26 DIAGNOSIS — D3A8 Other benign neuroendocrine tumors: Secondary | ICD-10-CM

## 2020-08-26 LAB — SARS CORONAVIRUS 2 (TAT 6-24 HRS): SARS Coronavirus 2: NEGATIVE

## 2020-08-26 NOTE — Telephone Encounter (Signed)
Lower EUS EMR scheduled for 09/10/20 at [facility] with Dr Rush Landmark at 730 am.  COVID scheduled for 09/07/20.  Left message on machine to call back

## 2020-08-26 NOTE — Telephone Encounter (Signed)
EUS scheduled, pt instructed and medications reviewed.  Patient instructions mailed to home.  Patient to call with any questions or concerns.  

## 2020-08-27 ENCOUNTER — Ambulatory Visit: Payer: Medicare Other | Admitting: Anesthesiology

## 2020-08-27 ENCOUNTER — Ambulatory Visit
Admission: RE | Admit: 2020-08-27 | Discharge: 2020-08-27 | Disposition: A | Discharge status: Home / Self Care | Payer: Medicare Other | Attending: Gastroenterology | Admitting: Gastroenterology

## 2020-08-27 ENCOUNTER — Other Ambulatory Visit: Payer: Self-pay

## 2020-08-27 ENCOUNTER — Encounter: Payer: Self-pay | Admitting: Gastroenterology

## 2020-08-27 ENCOUNTER — Encounter
Admission: RE | Disposition: A | Discharge status: Home / Self Care | Payer: Self-pay | Source: Home / Self Care | Attending: Gastroenterology

## 2020-08-27 ENCOUNTER — Other Ambulatory Visit: Payer: Medicare Other

## 2020-08-27 DIAGNOSIS — K644 Residual hemorrhoidal skin tags: Secondary | ICD-10-CM | POA: Insufficient documentation

## 2020-08-27 DIAGNOSIS — E119 Type 2 diabetes mellitus without complications: Secondary | ICD-10-CM | POA: Diagnosis not present

## 2020-08-27 DIAGNOSIS — Z9221 Personal history of antineoplastic chemotherapy: Secondary | ICD-10-CM | POA: Insufficient documentation

## 2020-08-27 DIAGNOSIS — K648 Other hemorrhoids: Secondary | ICD-10-CM | POA: Insufficient documentation

## 2020-08-27 DIAGNOSIS — Z7982 Long term (current) use of aspirin: Secondary | ICD-10-CM | POA: Diagnosis not present

## 2020-08-27 DIAGNOSIS — Z7984 Long term (current) use of oral hypoglycemic drugs: Secondary | ICD-10-CM | POA: Insufficient documentation

## 2020-08-27 DIAGNOSIS — I10 Essential (primary) hypertension: Secondary | ICD-10-CM | POA: Diagnosis not present

## 2020-08-27 DIAGNOSIS — Z803 Family history of malignant neoplasm of breast: Secondary | ICD-10-CM | POA: Insufficient documentation

## 2020-08-27 DIAGNOSIS — Z923 Personal history of irradiation: Secondary | ICD-10-CM | POA: Insufficient documentation

## 2020-08-27 DIAGNOSIS — D3A8 Other benign neuroendocrine tumors: Secondary | ICD-10-CM | POA: Insufficient documentation

## 2020-08-27 DIAGNOSIS — Z853 Personal history of malignant neoplasm of breast: Secondary | ICD-10-CM | POA: Diagnosis not present

## 2020-08-27 DIAGNOSIS — F17210 Nicotine dependence, cigarettes, uncomplicated: Secondary | ICD-10-CM | POA: Diagnosis not present

## 2020-08-27 DIAGNOSIS — Z8489 Family history of other specified conditions: Secondary | ICD-10-CM | POA: Insufficient documentation

## 2020-08-27 DIAGNOSIS — K219 Gastro-esophageal reflux disease without esophagitis: Secondary | ICD-10-CM | POA: Diagnosis not present

## 2020-08-27 DIAGNOSIS — J449 Chronic obstructive pulmonary disease, unspecified: Secondary | ICD-10-CM | POA: Diagnosis not present

## 2020-08-27 DIAGNOSIS — K639 Disease of intestine, unspecified: Secondary | ICD-10-CM | POA: Diagnosis not present

## 2020-08-27 DIAGNOSIS — Z800 Family history of malignant neoplasm of digestive organs: Secondary | ICD-10-CM | POA: Insufficient documentation

## 2020-08-27 HISTORY — PX: FLEXIBLE SIGMOIDOSCOPY: SHX5431

## 2020-08-27 HISTORY — DX: Chronic obstructive pulmonary disease, unspecified: J44.9

## 2020-08-27 LAB — GLUCOSE, CAPILLARY: Glucose-Capillary: 127 mg/dL — ABNORMAL HIGH (ref 70–99)

## 2020-08-27 SURGERY — SIGMOIDOSCOPY, FLEXIBLE
Anesthesia: General

## 2020-08-27 MED ORDER — SODIUM CHLORIDE 0.9 % IV SOLN: INTRAVENOUS | Status: DC

## 2020-08-27 MED ORDER — PROPOFOL 500 MG/50ML IV EMUL
INTRAVENOUS | Status: DC | PRN
  Administered 2020-08-27: 165 ug/kg/min via INTRAVENOUS

## 2020-08-27 MED ORDER — PROPOFOL 10 MG/ML IV BOLUS
INTRAVENOUS | Status: DC | PRN
  Administered 2020-08-27: 50 mg via INTRAVENOUS
  Administered 2020-08-27: 20 mg via INTRAVENOUS
  Administered 2020-08-27: 30 mg via INTRAVENOUS

## 2020-08-27 MED ORDER — GLYCOPYRROLATE 0.2 MG/ML IJ SOLN
INTRAMUSCULAR | Status: DC | PRN
  Administered 2020-08-27: .2 mg via INTRAVENOUS

## 2020-08-27 MED ORDER — LIDOCAINE HCL (CARDIAC) PF 100 MG/5ML IV SOSY
PREFILLED_SYRINGE | INTRAVENOUS | Status: DC | PRN
  Administered 2020-08-27: 100 mg via INTRAVENOUS

## 2020-08-27 MED ORDER — SPOT INK MARKER SYRINGE KIT
PACK | SUBMUCOSAL | Status: DC | PRN
  Administered 2020-08-27: 4 mL via SUBMUCOSAL

## 2020-08-27 NOTE — Transfer of Care (Signed)
Immediate Anesthesia Transfer of Care Note  Patient: Donna Riley  Procedure(s) Performed: FLEXIBLE SIGMOIDOSCOPY (N/A )  Patient Location: Endoscopy Unit  Anesthesia Type:General  Level of Consciousness: awake, drowsy and patient cooperative  Airway & Oxygen Therapy: Patient Spontanous Breathing and Patient connected to face mask oxygen  Post-op Assessment: Report given to RN and Post -op Vital signs reviewed and stable  Post vital signs: Reviewed and stable  Last Vitals:  Vitals Value Taken Time  BP 135/65 08/27/20 1243  Temp 36.1 C 08/27/20 1242  Pulse 101 08/27/20 1247  Resp 26 08/27/20 1247  SpO2 95 % 08/27/20 1247  Vitals shown include unvalidated device data.  Last Pain:  Vitals:   08/27/20 1242  TempSrc: Temporal  PainSc: Asleep         Complications: No complications documented.

## 2020-08-27 NOTE — Anesthesia Preprocedure Evaluation (Signed)
Anesthesia Evaluation  Patient identified by MRN, date of birth, ID band Patient awake    Reviewed: Allergy & Precautions, H&P , NPO status , Patient's Chart, lab work & pertinent test results  History of Anesthesia Complications Negative for: history of anesthetic complications  Airway Mallampati: II  TM Distance: >3 FB     Dental  (+) Edentulous Upper   Pulmonary neg sleep apnea, neg COPD, Current Smoker,    breath sounds clear to auscultation       Cardiovascular hypertension, (-) angina+ Peripheral Vascular Disease  (-) Past MI and (-) Cardiac Stents (-) dysrhythmias  Rhythm:regular Rate:Normal     Neuro/Psych negative neurological ROS  negative psych ROS   GI/Hepatic Neg liver ROS, GERD  Controlled,  Endo/Other  diabetes  Renal/GU negative Renal ROS  negative genitourinary   Musculoskeletal   Abdominal   Peds  Hematology negative hematology ROS (+)   Anesthesia Other Findings Past Medical History: No date: Arthritis     Comment:  shoulders and neck 2016: Breast cancer ([place])     Comment:   Left breast with chemo + rad tx's with lumpectomy. 06/23/2016: Breast wound, left, sequela 06/2014: Diabetes mellitus without complication ([place]) No date: Elevated LFTs 08/01/2016: Fatigue No date: GERD (gastroesophageal reflux disease) No date: Gout 01/23/2015: Hydradenitis No date: Hyperlipidemia No date: Hypertension 10/15/2016: Mastitis 2016: Personal history of chemotherapy     Comment:  left breast ca 2016: Personal history of radiation therapy     Comment:  LEFT lumpectomy No date: Personal history of radiation therapy No date: Positive PPD, treated No date: Shortness of breath dyspnea     Comment:  with exertion  Past Surgical History: 07/31/2014: BREAST BIOPSY; Left     Comment:  [facility]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  03/08/2016: BREAST BIOPSY; Left     Comment:  INTRADUCTAL PAPILLOMA  07/2014: BREAST LUMPECTOMY; Left      Comment:  [facility], DCIS, clear margins, 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 04/05/2016: BREAST LUMPECTOMY; Left     Comment:  excision of intraductal papilloma, no malignancy or               atypia 04/05/2016: BREAST LUMPECTOMY WITH NEEDLE LOCALIZATION; Left     Comment:  Procedure: BREAST LUMPECTOMY WITH NEEDLE LOCALIZATION;                Surgeon: Hubbard Robinson, MD;  Location: [facility];                Service: General;  Laterality: Left; 08/27/14: BREAST LUMPECTOMY WITH NEEDLE LOCALIZATION AND AXILLARY  SENTINEL LYMPH NODE BX; Left 09/07/2013: CHOLECYSTECTOMY 08/28/2015: COLONOSCOPY WITH PROPOFOL; N/A     Comment:  Procedure: COLONOSCOPY WITH PROPOFOL;  Surgeon: Lucilla Lame, MD;  Location: [place];  Service:               Endoscopy;  Laterality: N/A;  Diabetic oral 2014: ESOPHAGOGASTRODUODENOSCOPY     Comment:  gastritis 04/16/2019: ESOPHAGOGASTRODUODENOSCOPY (EGD) WITH PROPOFOL; N/A     Comment:  Procedure: ESOPHAGOGASTRODUODENOSCOPY (EGD) WITH               PROPOFOL;  Surgeon: Virgel Manifold, MD;  Location:               [facility];  Service: Endoscopy;  Laterality: N/A; 10/03/2019: ESOPHAGOGASTRODUODENOSCOPY (EGD) WITH PROPOFOL; N/A     Comment:  Procedure: ESOPHAGOGASTRODUODENOSCOPY (EGD) WITH  PROPOFOL;  Surgeon: Virgel Manifold, MD;  Location:               [facility];  Service: Endoscopy;  Laterality: N/A; 08/28/2015: POLYPECTOMY     Comment:  Procedure: POLYPECTOMY INTESTINAL;  Surgeon: Lucilla Lame, MD;  Location: [place];  Service:               Endoscopy;;  Rectal polyp No date: TUBAL LIGATION; Bilateral 1/44/8185: UMBILICAL HERNIA REPAIR; N/A     Comment:  Procedure: HERNIA REPAIR UMBILICAL ADULT;  Surgeon: Olean Ree, MD;  Location: [facility];  Service: General;                Laterality: N/A; 04/10/2015: VENTRAL HERNIA REPAIR; N/A     Comment:  Procedure: HERNIA REPAIR VENTRAL ADULT;  Surgeon:  Leonie Green, MD;  Location: [facility];  Service: General;              Laterality: N/A;  BMI    Body Mass Index: 30.56 kg/m      Reproductive/Obstetrics negative OB ROS                             Anesthesia Physical  Anesthesia Plan  ASA: II  Anesthesia Plan: General   Post-op Pain Management:    Induction:   PONV Risk Score and Plan: Propofol infusion and TIVA  Airway Management Planned: Nasal Cannula  Additional Equipment:   Intra-op Plan:   Post-operative Plan:   Informed Consent: I have reviewed the patients History and Physical, chart, labs and discussed the procedure including the risks, benefits and alternatives for the proposed anesthesia with the patient or authorized representative who has indicated his/her understanding and acceptance.     Dental Advisory Given  Plan Discussed with: Anesthesiologist, CRNA and Surgeon  Anesthesia Plan Comments:         Anesthesia Quick Evaluation

## 2020-08-27 NOTE — H&P (Signed)
Vonda Antigua, MD [address] [place], [place], [place], [address] [address], [place], [place], [place], [address] Phone: [phone]  Fax: [phone]  Primary Care Physician:  Glean Hess, MD   Pre-Procedure History & Physical: HPI:  Donna Riley is a 60 y.o. female is here for a flexible sigmoidoscopy.   Past Medical History:  Diagnosis Date  . Arthritis    shoulders and neck  . Breast cancer ([place]) 2016    Left breast with chemo + rad tx's with lumpectomy.  . Breast wound, left, sequela 06/23/2016  . COPD (chronic obstructive pulmonary disease) ([place])   . Diabetes mellitus without complication ([place]) 01/5092  . Elevated LFTs   . Fatigue 08/01/2016  . GERD (gastroesophageal reflux disease)   . Gout   . Hydradenitis 01/23/2015  . Hyperlipidemia   . Hypertension   . Mastitis 10/15/2016  . Personal history of chemotherapy 2016   left breast ca  . Personal history of radiation therapy 2016   LEFT lumpectomy  . Personal history of radiation therapy   . Positive PPD, treated   . Shortness of breath dyspnea    with exertion    Past Surgical History:  Procedure Laterality Date  . BREAST BIOPSY Left 07/31/2014   [facility] and DCIS   . BREAST BIOPSY Left 03/08/2016   INTRADUCTAL PAPILLOMA   . 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 BREAST LUMPECTOMY Left 04/05/2016   excision of intraductal papilloma, no malignancy or atypia  . BREAST LUMPECTOMY WITH NEEDLE LOCALIZATION Left 04/05/2016   Procedure: BREAST LUMPECTOMY WITH NEEDLE LOCALIZATION;  Surgeon: Hubbard Robinson, MD;  Location: [facility];  Service: General;  Laterality: Left;  . BREAST LUMPECTOMY WITH NEEDLE LOCALIZATION AND AXILLARY SENTINEL LYMPH NODE BX Left 08/27/14  . CHOLECYSTECTOMY  09/07/2013  . COLONOSCOPY WITH PROPOFOL N/A 08/28/2015   Procedure: COLONOSCOPY WITH PROPOFOL;  Surgeon: Lucilla Lame, MD;  Location: [place];  Service: Endoscopy;   Laterality: N/A;  Diabetic oral  . COLONOSCOPY WITH PROPOFOL N/A 08/14/2020   Procedure: COLONOSCOPY WITH PROPOFOL;  Surgeon: Virgel Manifold, MD;  Location: [facility];  Service: Endoscopy;  Laterality: N/A;  . ESOPHAGOGASTRODUODENOSCOPY  2014   gastritis  . ESOPHAGOGASTRODUODENOSCOPY (EGD) WITH PROPOFOL N/A 04/16/2019   Procedure: ESOPHAGOGASTRODUODENOSCOPY (EGD) WITH PROPOFOL;  Surgeon: Virgel Manifold, MD;  Location: [facility];  Service: Endoscopy;  Laterality: N/A;  . ESOPHAGOGASTRODUODENOSCOPY (EGD) WITH PROPOFOL N/A 10/03/2019   Procedure: ESOPHAGOGASTRODUODENOSCOPY (EGD) WITH PROPOFOL;  Surgeon: Virgel Manifold, MD;  Location: [facility];  Service: Endoscopy;  Laterality: N/A;  . POLYPECTOMY  08/28/2015   Procedure: POLYPECTOMY INTESTINAL;  Surgeon: Lucilla Lame, MD;  Location: [place];  Service: Endoscopy;;  Rectal polyp  . TUBAL LIGATION Bilateral   . UMBILICAL HERNIA REPAIR N/A 08/09/2016   Procedure: HERNIA REPAIR UMBILICAL ADULT;  Surgeon: Olean Ree, MD;  Location: [facility];  Service: General;  Laterality: N/A;  . VENTRAL HERNIA REPAIR N/A 04/10/2015   Procedure: HERNIA REPAIR VENTRAL ADULT;  Surgeon: Leonie Green, MD;  Location: [facility];  Service: General;  Laterality: N/A;    Prior to Admission medications   Medication Sig Start Date End Date Taking? Authorizing Provider  acetaminophen (TYLENOL) 325 MG tablet Take 650 mg by mouth daily as needed. 09/08/13  Yes [provider]  Alcohol Swabs (B-D SINGLE USE SWABS REGULAR) PADS 1 each by Does not apply route 2 (two) times daily. 11/22/17  Yes  Glean Hess, MD  amLODipine (NORVASC) 10 MG tablet TAKE TWO TABLETS BY MOUTH ONCE DAILY 08/21/20  Yes Glean Hess, MD  aspirin EC 81 MG tablet Take 81 mg by mouth daily.   Yes [provider]  atorvastatin (LIPITOR) 10 MG tablet Take 1 tablet (10 mg total) by mouth daily. 07/24/20  Yes Glean Hess, MD  Blood  Glucose Calibration (TRUE METRIX LEVEL 1) Low SOLN 1 each by In Vitro route daily as needed. 11/22/17  Yes Glean Hess, MD  Blood Glucose Monitoring Suppl (TRUE METRIX METER) DEVI 1 each by Does not apply route daily. 12/15/17  Yes Glean Hess, MD  calcium-vitamin D (OSCAL WITH D) 500-200 MG-UNIT TABS tablet Take by mouth daily.   Yes [provider]  hydrochlorothiazide (HYDRODIURIL) 25 MG tablet Take 25 mg by mouth daily.    Yes [provider]  HYDROcodone-acetaminophen (NORCO) 10-325 MG tablet Take 1 tablet by mouth every 6 (six) hours as needed for severe pain. 08/11/20 08/11/21 Yes Ronny Bacon, MD  irbesartan (AVAPRO) 300 MG tablet TAKE ONE TABLET BY MOUTH ONCE DAILY 07/09/20  Yes Glean Hess, MD  metFORMIN (GLUCOPHAGE-XR) 500 MG 24 hr tablet TAKE ONE TABLET BY MOUTH EVERY MORNING WITH BREAKFAST Patient taking differently: daily. 07/10/20  Yes Glean Hess, MD  Na Sulfate-K Sulfate-Mg Sulf 17.5-3.13-1.6 GM/177ML SOLN At 5 PM the day before procedure take 1 bottle and 5 hours before procedure take 1 bottle. Patient not taking: Reported on 08/27/2020 08/25/20   Virgel Manifold, MD    Allergies as of 08/26/2020  . (No Known Allergies)    Family History  Problem Relation Age of Onset  . Cancer Mother        Pancreatic  . Cancer Father        Throat  . Healthy Sister   . Cancer Sister        liver  . HIV Brother   . Cancer Maternal Grandmother        Breast  . Breast cancer Maternal Grandmother 75    Social History   Socioeconomic History  . Marital status: Widowed    Spouse name: Not on file  . Number of children: 4  . Years of education: Not on file  . Highest education level: 10th grade  Occupational History  . Occupation: Disabled  Tobacco Use  . Smoking status: Current Every Day Smoker    Packs/day: 0.50    Years: 44.00    Pack years: 22.00    Types: Cigarettes  . Smokeless tobacco: Never Used  Vaping Use  . Vaping Use:  Never used  Substance and Sexual Activity  . Alcohol use: Yes    Alcohol/week: 2.0 standard drinks    Types: 2 Cans of beer per week    Comment: Occasionally .beer yesterday morning  . Drug use: No  . Sexual activity: Not Currently  Other Topics Concern  . Not on file  Social History Narrative   Pt lives alone, son lives 2 doors down.   Social Determinants of Health   Financial Resource Strain: Low Risk   . Difficulty of Paying Living Expenses: Not very hard  Food Insecurity: No Food Insecurity  . Worried About Charity fundraiser in the Last Year: Never true  . Ran Out of Food in the Last Year: Never true  Transportation Needs: No Transportation Needs  . Lack of Transportation (Medical): No  . Lack of Transportation (Non-Medical): No  Physical Activity: Inactive  . Days of Exercise per Week: 0 days  . Minutes of Exercise per Session: 0 min  Stress: No Stress Concern Present  . Feeling of Stress : Only a little  Social Connections: Moderately Isolated  . Frequency of Communication with Friends and Family: More than three times a week  . Frequency of Social Gatherings with Friends and Family: Three times a week  . Attends Religious Services: More than 4 times per year  . Active Member of Clubs or Organizations: No  . Attends Archivist Meetings: Never  . Marital Status: Widowed  Intimate Partner Violence: Not At Risk  . Fear of Current or Ex-Partner: No  . Emotionally Abused: No  . Physically Abused: No  . Sexually Abused: No    Review of Systems: See HPI, otherwise negative ROS  Physical Exam: BP (!) 137/91   Pulse (!) 103   Temp (!) 96.7 F (35.9 C) (Temporal)   Resp 16   Ht 5\' 8"  (1.727 m)   Wt 92.1 kg   SpO2 100%   BMI 30.87 kg/m  General:   Alert,  pleasant and cooperative in NAD Head:  Normocephalic and atraumatic. Neck:  Supple; no masses or thyromegaly. Lungs:  Clear throughout to auscultation, normal respiratory effort.    Heart:  +S1,  +S2, Regular rate and rhythm, No edema. Abdomen:  Soft, nontender and nondistended. Normal bowel sounds, without guarding, and without rebound.   Neurologic:  Alert and  oriented x4;  grossly normal neurologically.  Impression/Plan: Donna Riley is here for a colonoscopy to be performed for neuroendocrine tumor of the colon  Risks, benefits, limitations, and alternatives regarding  colonoscopy have been reviewed with the patient.  Questions have been answered.  All parties agreeable.   Virgel Manifold, MD  08/27/2020, 12:20 PM

## 2020-08-27 NOTE — Progress Notes (Signed)
Tumor Board Documentation  Donna Riley was presented by Dr Mike Gip at our Tumor Board on 08/27/2020, which included representatives from medical oncology,radiation oncology,internal medicine,navigation,pathology,genetics,radiology,surgical,research,palliative care,pulmonology.  Donna Riley currently presents as a current patient,for MDC,for new positive pathology with history of the following treatments: active survellience.  Additionally, we reviewed previous medical and familial history, history of present illness, and recent lab results along with all available histopathologic and imaging studies. The tumor board considered available treatment options and made the following recommendations: Surgery (Resection versus Ablation)    The following procedures/referrals were also placed: No orders of the defined types were placed in this encounter.   Clinical Trial Status: not discussed   Staging used: AJCC Stage Group  AJCC Staging:       Group: Grade I Neuroendocrine Tumor   National site-specific guidelines NCCN were discussed with respect to the case.  Tumor board is a meeting of clinicians from various specialty areas who evaluate and discuss patients for whom a multidisciplinary approach is being considered. Final determinations in the plan of care are those of the provider(s). The responsibility for follow up of recommendations given during tumor board is that of the provider.   Today's extended care, comprehensive team conference, Donna Riley was not present for the discussion and was not examined.   Multidisciplinary Tumor Board is a multidisciplinary case peer review process.  Decisions discussed in the Multidisciplinary Tumor Board reflect the opinions of the specialists present at the conference without having examined the patient.  Ultimately, treatment and diagnostic decisions rest with the primary provider(s) and the patient.

## 2020-08-27 NOTE — Progress Notes (Signed)
[facility]  [address], [address] [place], [place] [address] Phone: [phone]  Fax: [phone]   Telemedicine Office Visit:  08/27/2020  Referring physician: Glean Hess, MD  I connected with Donna Riley on 08/28/2020 at 10:10 AM by videoconferencing and verified that I was speaking with the correct person using 2 identifiers.  The patient was at home.  I discussed the limitations, risk, security and privacy concerns of performing an evaluation and management service by videoconferencing and the availability of in person appointments.  I also discussed with the patient that there may be a patient responsible charge related to this service.  The patient expressed understanding and agreed to proceed.   Chief Complaint: Donna Riley is a 60 y.o. female with stage IIA left breast cancer and a neuroendocrine tumor of the sigmoid colon who is seen for review of interval colonoscopy and discussion regarding direction of therapy.  HPI: The patient was last seen in the medical oncology clinic on 07/20/2020 via telemedicine. At that time, the shooting/burning symptoms in her breasts had improved. The range of motion in her arms had improved.  We discussed ongoing surveillance.  Colonoscopy on 08/14/2020 by Dr. Bonna Gains revealed one 7 mm polyp in the ascending colon, three 4 to 6 mm polyps in the descending colon, one 3 mm polyp in the sigmoid colon, and a nodule in the sigmoid colon. Sigmoid colon nodule pathology revealed a grade 1 well-differentiated neuroendocrine tumor. Tumor involves the biopsy edges. The tumor cells were positive for synaptophysin and CD56. Cells were negative for CK7 and CK20. Ki67 proliferation index was less than 3%. Five colon polyps were tubular adenomas.  Patient underwent flexible sigmoidoscopy on 08/27/2020 by Dr. Bonna Gains.  The mucosal nodule in the sigmoid colon was tattooed.  EUS/EMR by Dr Rush Landmark in [place] is  scheduled for 09/10/2020.  Labs on 08/10/2020 revealed a hematocrit of 41.3, hemoglobin 13.5, platelets 307,000, WBC 8,300. CA27.29 was 21.1.  During the interim, she is felt "a whole lot better".  Breast discomfort has improved.  She denies any abdominal symptoms.  She denies any melena or hematochezia.   Past Medical History:  Diagnosis Date  . Arthritis    shoulders and neck  . Breast cancer ([place]) 2016    Left breast with chemo + rad tx's with lumpectomy.  . Breast wound, left, sequela 06/23/2016  . COPD (chronic obstructive pulmonary disease) ([place])   . Diabetes mellitus without complication ([place]) 09/8525  . Elevated LFTs   . Fatigue 08/01/2016  . GERD (gastroesophageal reflux disease)   . Gout   . Hydradenitis 01/23/2015  . Hyperlipidemia   . Hypertension   . Mastitis 10/15/2016  . Personal history of chemotherapy 2016   left breast ca  . Personal history of radiation therapy 2016   LEFT lumpectomy  . Personal history of radiation therapy   . Positive PPD, treated   . Shortness of breath dyspnea    with exertion    Past Surgical History:  Procedure Laterality Date  . BREAST BIOPSY Left 07/31/2014   [facility] and DCIS   . BREAST BIOPSY Left 03/08/2016   INTRADUCTAL PAPILLOMA   . 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 BREAST LUMPECTOMY Left 04/05/2016   excision of intraductal papilloma, no malignancy or atypia  . BREAST LUMPECTOMY WITH NEEDLE LOCALIZATION Left 04/05/2016   Procedure: BREAST LUMPECTOMY WITH NEEDLE LOCALIZATION;  Surgeon: Hubbard Robinson, MD;  Location: [facility];  Service: General;  Laterality: Left;  . BREAST LUMPECTOMY WITH NEEDLE LOCALIZATION AND AXILLARY SENTINEL LYMPH NODE BX Left 08/27/14  . CHOLECYSTECTOMY  09/07/2013  . COLONOSCOPY WITH PROPOFOL N/A 08/28/2015   Procedure: COLONOSCOPY WITH PROPOFOL;  Surgeon: Lucilla Lame, MD;  Location: [place];  Service: Endoscopy;  Laterality: N/A;  Diabetic oral  .  COLONOSCOPY WITH PROPOFOL N/A 08/14/2020   Procedure: COLONOSCOPY WITH PROPOFOL;  Surgeon: Virgel Manifold, MD;  Location: [facility];  Service: Endoscopy;  Laterality: N/A;  . ESOPHAGOGASTRODUODENOSCOPY  2014   gastritis  . ESOPHAGOGASTRODUODENOSCOPY (EGD) WITH PROPOFOL N/A 04/16/2019   Procedure: ESOPHAGOGASTRODUODENOSCOPY (EGD) WITH PROPOFOL;  Surgeon: Virgel Manifold, MD;  Location: [facility];  Service: Endoscopy;  Laterality: N/A;  . ESOPHAGOGASTRODUODENOSCOPY (EGD) WITH PROPOFOL N/A 10/03/2019   Procedure: ESOPHAGOGASTRODUODENOSCOPY (EGD) WITH PROPOFOL;  Surgeon: Virgel Manifold, MD;  Location: [facility];  Service: Endoscopy;  Laterality: N/A;  . POLYPECTOMY  08/28/2015   Procedure: POLYPECTOMY INTESTINAL;  Surgeon: Lucilla Lame, MD;  Location: [place];  Service: Endoscopy;;  Rectal polyp  . TUBAL LIGATION Bilateral   . UMBILICAL HERNIA REPAIR N/A 08/09/2016   Procedure: HERNIA REPAIR UMBILICAL ADULT;  Surgeon: Olean Ree, MD;  Location: [facility];  Service: General;  Laterality: N/A;  . VENTRAL HERNIA REPAIR N/A 04/10/2015   Procedure: HERNIA REPAIR VENTRAL ADULT;  Surgeon: Leonie Green, MD;  Location: [facility];  Service: General;  Laterality: N/A;    Family History  Problem Relation Age of Onset  . Cancer Mother        Pancreatic  . Cancer Father        Throat  . Healthy Sister   . Cancer Sister        liver  . HIV Brother   . Cancer Maternal Grandmother        Breast  . Breast cancer Maternal Grandmother 75    Social History:  reports that she has been smoking cigarettes. She has a 22.00 pack-year smoking history. She has never used smokeless tobacco. She reports current alcohol use of about 2.0 standard drinks of alcohol per week. She reports that she does not use drugs.  She has cut back on smoking to 6 cigarettes daily. She lost her husband in 05/2018.  She lives in [place]. The patient is alone today.  Allergies: No Known  Allergies  Current Medications: Current Outpatient Medications  Medication Sig Dispense Refill  . acetaminophen (TYLENOL) 325 MG tablet Take 650 mg by mouth daily as needed.    . Alcohol Swabs (B-D SINGLE USE SWABS REGULAR) PADS 1 each by Does not apply route 2 (two) times daily. 100 each 3  . amLODipine (NORVASC) 10 MG tablet TAKE TWO TABLETS BY MOUTH ONCE DAILY 60 tablet 2  . aspirin EC 81 MG tablet Take 81 mg by mouth daily.    Marland Kitchen atorvastatin (LIPITOR) 10 MG tablet Take 1 tablet (10 mg total) by mouth daily. 90 tablet 3  . Blood Glucose Calibration (TRUE METRIX LEVEL 1) Low SOLN 1 each by In Vitro route daily as needed. 1 each 3  . Blood Glucose Monitoring Suppl (TRUE METRIX METER) DEVI 1 each by Does not apply route daily. 100 Device 3  . calcium-vitamin D (OSCAL WITH D) 500-200 MG-UNIT TABS tablet Take by mouth daily.    . hydrochlorothiazide (HYDRODIURIL) 25 MG tablet Take 25 mg by mouth daily.     Marland Kitchen HYDROcodone-acetaminophen (NORCO) 10-325 MG tablet Take 1 tablet by mouth every 6 (  six) hours as needed for severe pain. 12 tablet 0  . irbesartan (AVAPRO) 300 MG tablet TAKE ONE TABLET BY MOUTH ONCE DAILY 90 tablet 0  . metFORMIN (GLUCOPHAGE-XR) 500 MG 24 hr tablet TAKE ONE TABLET BY MOUTH EVERY MORNING WITH BREAKFAST (Patient taking differently: daily.) 90 tablet 0  . Na Sulfate-K Sulfate-Mg Sulf 17.5-3.13-1.6 GM/177ML SOLN At 5 PM the day before procedure take 1 bottle and 5 hours before procedure take 1 bottle. 354 mL 0   No current facility-administered medications for this visit.    Review of Systems  Constitutional: Negative for chills, fever, malaise/fatigue and weight loss (no new weight).       Feels "a whole lot better".  HENT: Negative.  Negative for congestion, ear pain, nosebleeds, sinus pain and sore throat.   Eyes: Negative.  Negative for blurred vision and pain.  Respiratory: Negative.  Negative for cough, hemoptysis, sputum production and shortness of breath.    Cardiovascular: Negative.  Negative for chest pain, palpitations and orthopnea.  Gastrointestinal: Negative.  Negative for abdominal pain, blood in stool, constipation, diarrhea, heartburn, melena, nausea and vomiting.  Genitourinary: Negative.  Negative for frequency, hematuria and urgency.  Musculoskeletal: Positive for back pain (chronic) and joint pain (shoulder).  Skin: Negative.   Neurological: Negative for dizziness, tingling, sensory change, speech change, focal weakness, weakness and headaches.  Endo/Heme/Allergies: Negative.  Does not bruise/bleed easily.  Psychiatric/Behavioral: Negative for depression and memory loss. The patient is not nervous/anxious and does not have insomnia.    Performance status (ECOG): 1   Vitals There were no vitals taken for this visit.   Physical Exam Vitals and nursing note reviewed.  Constitutional:      General: She is not in acute distress.    Appearance: Normal appearance. She is not ill-appearing.  HENT:     Head:     Comments: Brown cap. Eyes:     General: No scleral icterus.    Conjunctiva/sclera: Conjunctivae normal.     Comments: Brown eyes.  Neurological:     Mental Status: She is alert and oriented to person, place, and time.  Psychiatric:        Mood and Affect: Mood normal.        Behavior: Behavior normal.        Thought Content: Thought content normal.        Judgment: Judgment normal.    Hospital Outpatient Visit on 08/26/2020  Component Date Value Ref Range Status  . SARS Coronavirus 2 08/26/2020 NEGATIVE  NEGATIVE Final   Comment: (NOTE) SARS-CoV-2 target nucleic acids are NOT DETECTED.  The SARS-CoV-2 RNA is generally detectable in upper and lower respiratory specimens during the acute phase of infection. Negative results do not preclude SARS-CoV-2 infection, do not rule out co-infections with other pathogens, and should not be used as the sole basis for treatment or other patient management decisions. Negative  results must be combined with clinical observations, patient history, and epidemiological information. The expected result is Negative.  Fact Sheet for Patients: SugarRoll.be  Fact Sheet for Healthcare Providers: https://www.woods-mathews.com/  This test is not yet approved or cleared by the [place] FDA and  has been authorized for detection and/or diagnosis of SARS-CoV-2 by FDA under an Emergency Use Authorization (EUA). This EUA will remain  in effect (meaning this test can be used) for the duration of the COVID-19 declaration under Se  ction 564(b)(1) of the Act, 21 U.S.C. section 360bbb-3(b)(1), unless the authorization is terminated or revoked sooner.  Performed at [facility], [place] [address] [place], [place] [address]     Assessment:  Donna Riley is a 60 y.o. female with stage IIA left breast cancerstatus post partial mastectomy and sentinel lymph node biopsy on 08/27/2014. 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 There was lymphovascular invasion. One of 2 sentinel lymph nodes were positive (focus of 2.8 mm). Tumor was >90% ER positive, >90% PR positive, and Her2/neu negative. Pathologic stage was T1bN1aM0.  Bone scanon 09/16/2014 revealed abnormal focal uptake at the level of right L3 pedicle and L5 vertebral body. Lumbar spine MRI on 09/27/2014 revealed no evidence of metastatic disease with lower lumbar facet arthritis.Abdominal and pelvic CT scanon 09/24/2014 revealed hepatomegaly and no evidence of metastatic disease.   She received 4 cycles of Taxotere and Cytoxan(09/29/2014 - 12/09/2014) with Neulasta support. She received 50.4 Gyto the left breast from 01/12/2015 until 02/23/2015. She was started on Femaraon 03/12/2015, but switched to [place] 03/30/2015 secondary to diffuse joint aches. Arimidex  completed around 03/13/2020.  BCI testing on 08/20/2019 revealed an estimated risk of late recurrence between years 5-10 of 10.1% (95% CI: 5.6-14.3%).  She is not likely to benefit from extended endocrine therapy.  CA27.29has been followed: 16.9 on 09/09/2014, 12.1 on 06/02/2015, 15.6 on 08/07/2015, 17.4 on 11/23/2015, 13.6 on 02/22/2016, 18.9 on 06/20/2016, 18.4 on 10/21/2016, 15 on 02/23/2017, 14.1 on 06/26/2017, 15.6 on 12/25/2017, 12.4 on 08/08/2018, 8.8 on 02/08/2019, 19.2 on 08/08/2019, and 18 on 02/11/2020.  Bilateral diagnostic mammogramon 07/30/2015 revealed no evidence of malignancy. Left sided mammogram and ultrasound on 02/29/2016 revealed a 1.5 x 0.6 x 0.7 cm intraductal mass. Bilateral mammogramon 09/12/2016 revealed no focal fluid collection at the surgical site to account for the persistent drainage. There were no suspicious mammographic or targeted sonographic abnormalities at the lumpectomy site. There was no evidence of malignancy in the bilateral breasts.   Bilateral mammogram on 09/14/2017 that revealed maturing fat necrosis at the LEFT lumpectomy site. There was no evidence of malignancy in either breast. Left diagnostic mammogram and ultrasound on 01/01/2018 revealed no significant abnormality other than a single normal appearing lymph node over the axillary tail portion of the left breast in the area of palpable abnormality and tenderness.  Bilateral diagnostic mammogram on 09/17/2018 revealed no evidence of malignancy in either breast. There were expected post-lumpectomy changes involving the left breast. Diagnostic bilateral mammogram on 09/19/2019 revealed stable postsurgical changes in the left breast. There was no evidence of malignancy.  Bilateral diagnostic mammogram on 07/01/2020 revealed no evidence of malignancy in either breast.  Targeted ultrasound on 07/01/2020 showed normal tissue throughoutthe upper-outer quadrant of the right breast from 8-12 o'clock. There was  no solid or cystic mass, abnormal shadowing or distortion visualized.  Bilateral breast MRI on 07/14/2020 revealed no MRI evidence of malignancy involving either breast. There were post lumpectomy changes involving the inner left breast.  She had chronic left nipple discharge. Left breast lumpectomyat the 11 o'clock position on 04/05/2016 revealed an intraductal papillomawith sclerosis and calcifications. There was fat necrosis with fibrosis and calcifications. Pathology was negative for atypia and malignancy  Colonoscopyon 08/28/2015 revealed one 4 mm polyp in the rectum (tubular adenoma). EGD on 10/03/2019 revealed erythematous mucosa in the antrum. Pathology revealed antral and oxyntic mucosa with moderate chronic inactive gastritis. H. Pylori was negative.  There was no intestinal metaplasia, dysplasia, and malignancy.   Colonoscopy on 08/14/2020 revealed one 7 mm polyp in the ascending colon, three 4 to 6 mm polyps in the descending colon, one 3 mm polyp in the sigmoid colon, and a nodule in the sigmoid colon.  Five colon polyps were tubular adenomas.  Sigmoid colon nodule pathology revealed a grade 1 well-differentiated neuroendocrine tumor.  Tumor involves the biopsy edges. The tumor cells were positive for synaptophysin and CD56. Cells were negative for CK7 and CK20. Ki67 proliferation index was less than 3%.  Bone densitystudy on 09/15/2014 revealed osteopeniawith a T-score of -2.1 at L1-L4. Bone densityon 01/17/2018 revealed osteoporosiswith a T-score of -2.6 in the AP spine L1-L4. Bone density on 03/03/2020 revealed osteopenia with a T score of -2.2 at the lumbar spine. She is on calcium and vitamin D.   She has a smoking history.  Low dose chest CT on 02/05/2020 revealed Lung-RADS 2S, benign appearance or behavior.  There was aortic atherosclerosis, in addition to left anterior descending coronary artery disease. There was mild diffuse bronchial wall thickening with mild  centrilobular and paraseptal emphysema suggestive of underlying COPD.  Symptomatically, she is doing well.  She denies any concerns.  Plan: 1.   Sigmoid low grade neuroendocrine tumor  Review interval colonoscopy and pathology.  She has undergone clip placement followed by tattooing.  Discuss the plan for follow-up endoscopic ultrasound and resection/ablation.  Discuss tumor board discussions from 08/27/2020.  Anticipate surveillance s/p resection/ablation. 2.   Stage IIALEFTbreast cancer Clinically, she isdoing well. Breast pain has improved. Bilateral breast MRI in 07/14/2020 revealed no evidence of malignancy.   She completed 5 years of endocrine therapy on 03/13/2020.   BCI testing on 08/20/2019 revealed no benefit from extended endocrine therapy Continue surveillance. 3.   Osteopenia             Bone density on 03/03/2020 revealed osteopenia with a T-score of -2.2.  Continue calcium and vitamin D. 4.   Smoking history  She has a 33 pack year smoking history.  Low-dose chest CT on 02/05/2020 revealed no evidence of malignancy.  Next low-dose chest CT in 01/2021. 5.   RTC as previously scheduled on 12/29/2020.  I discussed the assessment and treatment plan with the patient.  The patient was provided an opportunity to ask questions and all were answered.  The patient agreed with the plan and demonstrated an understanding of the instructions.  The patient was advised to call back if the symptoms worsen or if the condition fails to improve as anticipated.  I provided 19 minutes (10:10 AM - 10:18 AM) of face-to-face video visit time during this this encounter and > 50% was spent counseling as documented under my assessment and plan.  I provided these services from home.  An additional 10 minutes were spent reviewing her chart (Epic and Care Everywhere) including notes, labs, and imaging studies.    Lequita Asal, MD, PhD    08/28/2020,3:17 PM

## 2020-08-27 NOTE — Op Note (Signed)
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 This procedure is to place a                         tattoo at the site before the clips placed at the site                         fall off. This would allow for localization of the                         site in the future for EUS/EMR by [facility] Providers:             Jamisha Hoeschen B. Bonna Gains MD, MD Referring MD:          Halina Maidens, MD (Referring MD) Medicines:             General Anesthesia Complications:         No immediate complications. Procedure:             Pre-Anesthesia Assessment:                        - Prior to the procedure, a History and Physical was                         performed, and patient medications and allergies were                         reviewed. The patient is competent. The risks and                         benefits of the procedure and the sedation options and                         risks were discussed with the patient. All questions                         were answered and informed consent was obtained.                         Patient identification and proposed procedure were                         verified by the physician, the nurse, the                         anesthesiologist, the anesthetist and the technician                         in the pre-procedure area in the procedure room in the  endoscopy suite. Mental Status Examination: alert and                         oriented. Airway Examination: normal oropharyngeal                         airway and neck mobility. Respiratory Examination:                          clear to auscultation. CV Examination: normal.                         Prophylactic Antibiotics: The patient does not require                         prophylactic antibiotics. Prior Anticoagulants: The                         patient has taken no previous anticoagulant or                         antiplatelet agents. ASA Grade Assessment: II - A                         patient with mild systemic disease. After reviewing                         the risks and benefits, the patient was deemed in                         satisfactory condition to undergo the procedure. The                         anesthesia plan was to use general anesthesia.                         Immediately prior to administration of medications,                         the patient was re-assessed for adequacy to receive                         sedatives. The heart rate, respiratory rate, oxygen                         saturations, blood pressure, adequacy of pulmonary                         ventilation, and response to care were monitored                         throughout the procedure. The physical status of the                         patient was re-assessed after the procedure.                        After obtaining informed consent, the scope was passed  under direct vision. The Colonoscope was introduced                         through the anus and advanced to the the sigmoid                         colon. The flexible sigmoidoscopy was accomplished                         with ease. The patient tolerated the procedure well. Findings:      The perianal and digital rectal examinations were normal.      One mucosal nodule was found in the sigmoid colon. Area was tattooed       with an injection of Spot (carbon black). 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 The tattoo was placed just       above the clip/nodule site. The nodule was  present 15 cm from the anal       verge.      Non-bleeding internal hemorrhoids were found during retroflexion.      Anal papilla(e) were hypertrophied.      No additional abnormalities were found on retroflexion. Impression:            - Mucosal nodule in the sigmoid colon. Tattooed.                        - Non-bleeding internal hemorrhoids.                        - Anal papilla(e) were hypertrophied.                        - No specimens collected. Recommendation:        - Pt will be discussed in Tumor board this week. Await                         recommendations.                        - Follow up with Dr. Mike Gip from oncology                        - Follow with Dr. Rush Landmark for EUS/EMR                        - Follow up in my clinic                        - Resume previous diet.                        - Continue present medications.                        - The findings and recommendations were discussed with                         the patient.                        - The  findings and recommendations were discussed with                         the patient's family.                        - Return to primary care physician in 4 weeks. Procedure Code(s):     --- Professional ---                        [id number], Sigmoidoscopy, flexible; with directed                         submucosal injection(s), any substance Diagnosis Code(s):     --- Professional ---                        K63.89, Other specified diseases of intestine                        K62.89, Other specified diseases of anus and rectum CPT copyright 2019 American Medical Association. All rights reserved. The codes documented in this report are preliminary and upon coder review may  be revised to meet current compliance requirements.  Vonda Antigua, MD Margretta Sidle B. Bonna Gains MD, MD 08/27/2020 12:48:42 PM This report has been signed electronically. Number of Addenda: 0 Note Initiated On: 08/27/2020 12:22 PM Total  Procedure Duration: 0 hours 8 minutes 0 seconds  Estimated Blood Loss:  Estimated blood loss: none.      [facility]

## 2020-08-27 NOTE — Anesthesia Procedure Notes (Signed)
Procedure Name: General with mask airway Performed by: Fletcher-Harrison, Kittie Krizan, CRNA Pre-anesthesia Checklist: Patient identified, Emergency Drugs available, Suction available and Patient being monitored Patient Re-evaluated:Patient Re-evaluated prior to induction Oxygen Delivery Method: Simple face mask Induction Type: IV induction Placement Confirmation: positive ETCO2 and CO2 detector Dental Injury: Teeth and Oropharynx as per pre-operative assessment        

## 2020-08-28 ENCOUNTER — Encounter: Payer: Self-pay | Admitting: Hematology and Oncology

## 2020-08-28 ENCOUNTER — Inpatient Hospital Stay: Payer: Medicare Other | Attending: Hematology and Oncology | Admitting: Hematology and Oncology

## 2020-08-28 DIAGNOSIS — C50912 Malignant neoplasm of unspecified site of left female breast: Secondary | ICD-10-CM | POA: Diagnosis not present

## 2020-08-28 DIAGNOSIS — Z170 Estrogen receptor positive status [ER+]: Secondary | ICD-10-CM | POA: Diagnosis not present

## 2020-08-28 DIAGNOSIS — D3A8 Other benign neuroendocrine tumors: Secondary | ICD-10-CM | POA: Diagnosis not present

## 2020-08-28 DIAGNOSIS — Z87891 Personal history of nicotine dependence: Secondary | ICD-10-CM | POA: Diagnosis not present

## 2020-08-28 DIAGNOSIS — C269 Malignant neoplasm of ill-defined sites within the digestive system: Secondary | ICD-10-CM

## 2020-08-28 HISTORY — DX: Malignant neoplasm of ill-defined sites within the digestive system: C26.9

## 2020-08-28 NOTE — Anesthesia Postprocedure Evaluation (Signed)
Anesthesia Post Note  Patient: ALEXCIS BICKING  Procedure(s) Performed: FLEXIBLE SIGMOIDOSCOPY (N/A )  Patient location during evaluation: PACU Anesthesia Type: General Level of consciousness: awake and alert Pain management: pain level controlled Vital Signs Assessment: post-procedure vital signs reviewed and stable Respiratory status: spontaneous breathing, nonlabored ventilation and respiratory function stable Cardiovascular status: blood pressure returned to baseline and stable Postop Assessment: no apparent nausea or vomiting Anesthetic complications: no   No complications documented.   Last Vitals:  Vitals:   08/27/20 1252 08/27/20 1302  BP: (!) 187/90 (!) 187/90  Pulse:    Resp:    Temp:    SpO2:      Last Pain:  Vitals:   08/27/20 1302  TempSrc:   PainSc: 0-No pain                 Brett Canales Deshay Blumenfeld

## 2020-08-29 DIAGNOSIS — Z87891 Personal history of nicotine dependence: Secondary | ICD-10-CM | POA: Insufficient documentation

## 2020-09-01 DIAGNOSIS — M79671 Pain in right foot: Secondary | ICD-10-CM | POA: Diagnosis not present

## 2020-09-01 DIAGNOSIS — E119 Type 2 diabetes mellitus without complications: Secondary | ICD-10-CM | POA: Diagnosis not present

## 2020-09-01 DIAGNOSIS — M79672 Pain in left foot: Secondary | ICD-10-CM | POA: Diagnosis not present

## 2020-09-01 DIAGNOSIS — L851 Acquired keratosis [keratoderma] palmaris et plantaris: Secondary | ICD-10-CM | POA: Diagnosis not present

## 2020-09-07 ENCOUNTER — Other Ambulatory Visit (HOSPITAL_COMMUNITY)
Admission: RE | Admit: 2020-09-07 | Discharge: 2020-09-07 | Disposition: A | Discharge status: Home / Self Care | Payer: Medicare Other | Source: Ambulatory Visit | Attending: Gastroenterology | Admitting: Gastroenterology

## 2020-09-07 DIAGNOSIS — Z01812 Encounter for preprocedural laboratory examination: Secondary | ICD-10-CM | POA: Insufficient documentation

## 2020-09-07 DIAGNOSIS — Z20822 Contact with and (suspected) exposure to covid-19: Secondary | ICD-10-CM | POA: Insufficient documentation

## 2020-09-07 LAB — SARS CORONAVIRUS 2 (TAT 6-24 HRS): SARS Coronavirus 2: NEGATIVE

## 2020-09-09 ENCOUNTER — Encounter (HOSPITAL_COMMUNITY): Payer: Self-pay | Admitting: Gastroenterology

## 2020-09-09 NOTE — Progress Notes (Signed)
Patient denies shortness of breath, fever, cough or chest pain.  PCP - Dr Halina Maidens  Cardiologist - Dr Haroldine Laws - Dr Vonda Antigua Internal Med - Dr Halina Maidens Oncology - Dr Mike Gip  CT/Chest x-ray - 02/05/20 EKG - 08/13/20 CE Stress Test - n/a ECHO - n/a Cardiac Cath - n/a  Fasting Blood Sugar - 100-140s Checks Blood Sugar 1 times a day . Do not take oral diabetes medicines (metformin) the morning of surgery.  STOP now taking any Aspirin (unless otherwise instructed by your surgeon), Aleve, Naproxen, Ibuprofen, Motrin, Advil, Goody's, BC's, all herbal medications, fish oil, and all vitamins.   Coronavirus Screening Covid test on 09/07/20 was negative.  Patient verbalized understanding of instructions that were given via phone.

## 2020-09-10 ENCOUNTER — Other Ambulatory Visit: Payer: Self-pay

## 2020-09-10 ENCOUNTER — Encounter (HOSPITAL_COMMUNITY): Payer: Self-pay | Admitting: Gastroenterology

## 2020-09-10 ENCOUNTER — Ambulatory Visit (HOSPITAL_COMMUNITY): Payer: Medicare Other | Admitting: Anesthesiology

## 2020-09-10 ENCOUNTER — Encounter (HOSPITAL_COMMUNITY)
Admission: RE | Disposition: A | Discharge status: Home / Self Care | Payer: Self-pay | Source: Home / Self Care | Attending: Gastroenterology

## 2020-09-10 ENCOUNTER — Ambulatory Visit (HOSPITAL_COMMUNITY)
Admission: RE | Admit: 2020-09-10 | Discharge: 2020-09-10 | Disposition: A | Discharge status: Home / Self Care | Payer: Medicare Other | Attending: Gastroenterology | Admitting: Gastroenterology

## 2020-09-10 DIAGNOSIS — C7A010 Malignant carcinoid tumor of the duodenum: Secondary | ICD-10-CM | POA: Diagnosis not present

## 2020-09-10 DIAGNOSIS — Z923 Personal history of irradiation: Secondary | ICD-10-CM | POA: Diagnosis not present

## 2020-09-10 DIAGNOSIS — T185XXA Foreign body in anus and rectum, initial encounter: Secondary | ICD-10-CM | POA: Diagnosis not present

## 2020-09-10 DIAGNOSIS — Z9221 Personal history of antineoplastic chemotherapy: Secondary | ICD-10-CM | POA: Insufficient documentation

## 2020-09-10 DIAGNOSIS — K6389 Other specified diseases of intestine: Secondary | ICD-10-CM | POA: Diagnosis not present

## 2020-09-10 DIAGNOSIS — D3A8 Other benign neuroendocrine tumors: Secondary | ICD-10-CM | POA: Diagnosis not present

## 2020-09-10 DIAGNOSIS — K644 Residual hemorrhoidal skin tags: Secondary | ICD-10-CM | POA: Diagnosis not present

## 2020-09-10 DIAGNOSIS — F17210 Nicotine dependence, cigarettes, uncomplicated: Secondary | ICD-10-CM | POA: Diagnosis not present

## 2020-09-10 DIAGNOSIS — K581 Irritable bowel syndrome with constipation: Secondary | ICD-10-CM | POA: Diagnosis not present

## 2020-09-10 DIAGNOSIS — K6289 Other specified diseases of anus and rectum: Secondary | ICD-10-CM | POA: Diagnosis not present

## 2020-09-10 DIAGNOSIS — K641 Second degree hemorrhoids: Secondary | ICD-10-CM | POA: Insufficient documentation

## 2020-09-10 DIAGNOSIS — Z853 Personal history of malignant neoplasm of breast: Secondary | ICD-10-CM | POA: Diagnosis not present

## 2020-09-10 DIAGNOSIS — Z803 Family history of malignant neoplasm of breast: Secondary | ICD-10-CM | POA: Diagnosis not present

## 2020-09-10 DIAGNOSIS — D3A025 Benign carcinoid tumor of the sigmoid colon: Secondary | ICD-10-CM | POA: Diagnosis not present

## 2020-09-10 DIAGNOSIS — K580 Irritable bowel syndrome with diarrhea: Secondary | ICD-10-CM | POA: Diagnosis not present

## 2020-09-10 DIAGNOSIS — Z8719 Personal history of other diseases of the digestive system: Secondary | ICD-10-CM | POA: Insufficient documentation

## 2020-09-10 DIAGNOSIS — C7A8 Other malignant neuroendocrine tumors: Secondary | ICD-10-CM | POA: Diagnosis not present

## 2020-09-10 HISTORY — PX: FOREIGN BODY REMOVAL: SHX962

## 2020-09-10 HISTORY — PX: HEMOSTASIS CLIP PLACEMENT: SHX6857

## 2020-09-10 HISTORY — PX: SUBMUCOSAL LIFTING INJECTION: SHX6855

## 2020-09-10 HISTORY — PX: ENDOSCOPIC MUCOSAL RESECTION: SHX6839

## 2020-09-10 HISTORY — PX: FLEXIBLE SIGMOIDOSCOPY: SHX5431

## 2020-09-10 HISTORY — PX: EUS: SHX5427

## 2020-09-10 LAB — GLUCOSE, CAPILLARY
Glucose-Capillary: 108 mg/dL — ABNORMAL HIGH (ref 70–99)
Glucose-Capillary: 99 mg/dL (ref 70–99)

## 2020-09-10 SURGERY — ULTRASOUND, LOWER GI TRACT, ENDOSCOPIC
Anesthesia: Monitor Anesthesia Care

## 2020-09-10 MED ORDER — PHENYLEPHRINE HCL-NACL 10-0.9 MG/250ML-% IV SOLN
INTRAVENOUS | Status: DC | PRN
  Administered 2020-09-10: 20 ug/min via INTRAVENOUS

## 2020-09-10 MED ORDER — PROPOFOL 500 MG/50ML IV EMUL
INTRAVENOUS | Status: DC | PRN
  Administered 2020-09-10 (×2): 150 ug/kg/min via INTRAVENOUS
  Administered 2020-09-10: 125 ug/kg/min via INTRAVENOUS

## 2020-09-10 MED ORDER — DEXMEDETOMIDINE (PRECEDEX) IN NS 20 MCG/5ML (4 MCG/ML) IV SYRINGE
PREFILLED_SYRINGE | INTRAVENOUS | Status: DC | PRN
  Administered 2020-09-10: 12 ug via INTRAVENOUS

## 2020-09-10 MED ORDER — PROPOFOL 10 MG/ML IV BOLUS
INTRAVENOUS | Status: DC | PRN
  Administered 2020-09-10: 20 mg via INTRAVENOUS
  Administered 2020-09-10: 10 mg via INTRAVENOUS

## 2020-09-10 MED ORDER — SODIUM CHLORIDE 0.9 % IV SOLN: INTRAVENOUS | Status: DC

## 2020-09-10 MED ORDER — LACTATED RINGERS IV SOLN
INTRAVENOUS | Status: DC
  Administered 2020-09-10: 1000 mL via INTRAVENOUS

## 2020-09-10 NOTE — Anesthesia Procedure Notes (Signed)
Procedure Name: MAC Performed by: Valda Favia, CRNA Pre-anesthesia Checklist: Patient identified, Emergency Drugs available, Suction available, Patient being monitored and Timeout performed Patient Re-evaluated:Patient Re-evaluated prior to induction Oxygen Delivery Method: Simple face mask Preoxygenation: Pre-oxygenation with 100% oxygen Induction Type: IV induction Placement Confirmation: positive ETCO2 Dental Injury: Teeth and Oropharynx as per pre-operative assessment

## 2020-09-10 NOTE — Discharge Instructions (Signed)
Colonoscopy, Adult, Care After This sheet gives you information about how to care for yourself after your procedure. Your health care provider may also give you more specific instructions. If you have problems or questions, contact your health care provider. What can I expect after the procedure? After the procedure, it is common to have:  A small amount of blood in your stool for 24 hours after the procedure.  Some gas.  Mild cramping or bloating of your abdomen. Follow these instructions at home: Eating and drinking  Drink enough fluid to keep your urine pale yellow.  Follow instructions from your health care provider about eating or drinking restrictions.  Resume your normal diet as instructed by your health care provider. Avoid heavy or fried foods that are hard to digest.   Activity  Rest as told by your health care provider.  Avoid sitting for a long time without moving. Get up to take short walks every 1-2 hours. This is important to improve blood flow and breathing. Ask for help if you feel weak or unsteady.  Return to your normal activities as told by your health care provider. Ask your health care provider what activities are safe for you. Managing cramping and bloating  Try walking around when you have cramps or feel bloated.  Apply heat to your abdomen as told by your health care provider. Use the heat source that your health care provider recommends, such as a moist heat pack or a heating pad. ? Place a towel between your skin and the heat source. ? Leave the heat on for 20-30 minutes. ? Remove the heat if your skin turns bright red. This is especially important if you are unable to feel pain, heat, or cold. You may have a greater risk of getting burned.   General instructions  If you were given a sedative during the procedure, it can affect you for several hours. Do not drive or operate machinery until your health care provider says that it is safe.  For the first  24 hours after the procedure: ? Do not sign important documents. ? Do not drink alcohol. ? Do your regular daily activities at a slower pace than normal. ? Eat soft foods that are easy to digest.  Take over-the-counter and prescription medicines only as told by your health care provider.  Keep all follow-up visits as told by your health care provider. This is important. Contact a health care provider if:  You have blood in your stool 2-3 days after the procedure. Get help right away if you have:  More than a small spotting of blood in your stool.  Large blood clots in your stool.  Swelling of your abdomen.  Nausea or vomiting.  A fever.  Increasing pain in your abdomen that is not relieved with medicine. Summary  After the procedure, it is common to have a small amount of blood in your stool. You may also have mild cramping and bloating of your abdomen.  If you were given a sedative during the procedure, it can affect you for several hours. Do not drive or operate machinery until your health care provider says that it is safe.  Get help right away if you have a lot of blood in your stool, nausea or vomiting, a fever, or increased pain in your abdomen. This information is not intended to replace advice given to you by your health care provider. Make sure you discuss any questions you have with your health care provider. Document Revised:  05/10/2019 Document Reviewed: 12/10/2018 Elsevier Patient Education  2021 [place] Anesthesia refers to techniques, procedures, and medicines that help a person stay safe and comfortable during a medical or dental procedure. Monitored anesthesia care, or sedation, is one type of anesthesia. Your anesthesia specialist may recommend sedation if you will be having a procedure that does not require you to be unconscious. You may have this procedure for:  Cataract surgery.  A dental procedure.  A biopsy.  A  colonoscopy. During the procedure, you may receive a medicine to help you relax (sedative). There are three levels of sedation:  Mild sedation. At this level, you may feel awake and relaxed. You will be able to follow directions.  Moderate sedation. At this level, you will be sleepy. You may not remember the procedure.  Deep sedation. At this level, you will be asleep. You will not remember the procedure. The more medicine you are given, the deeper your level of sedation will be. Depending on how you respond to the procedure, the anesthesia specialist may change your level of sedation or the type of anesthesia to fit your needs. An anesthesia specialist will monitor you closely during the procedure. Tell a health care provider about:  Any allergies you have.  All medicines you are taking, including vitamins, herbs, eye drops, creams, and over-the-counter medicines.  Any problems you or family members have had with anesthetic medicines.  Any blood disorders you have.  Any surgeries you have had.  Any medical conditions you have, such as sleep apnea.  Whether you are pregnant or may be pregnant.  Whether you use cigarettes, alcohol, or drugs.  Any use of steroids, whether by mouth or as a cream. What are the risks? Generally, this is a safe procedure. However, problems may occur, including:  Getting too much medicine (oversedation).  Nausea.  Allergic reaction to medicines.  Trouble breathing. If this happens, a breathing tube may be used to help with breathing. It will be removed when you are awake and breathing on your own.  Heart trouble.  Lung trouble.  Confusion that gets better with time (emergence delirium). What happens before the procedure? Staying hydrated Follow instructions from your health care provider about hydration, which may include:  Up to 2 hours before the procedure - you may continue to drink clear liquids, such as water, clear fruit juice, black  coffee, and plain tea. Eating and drinking restrictions Follow instructions from your health care provider about eating and drinking, which may include:  8 hours before the procedure - stop eating heavy meals or foods, such as meat, fried foods, or fatty foods.  6 hours before the procedure - stop eating light meals or foods, such as toast or cereal.  6 hours before the procedure - stop drinking milk or drinks that contain milk.  2 hours before the procedure - stop drinking clear liquids. Medicines Ask your health care provider about:  Changing or stopping your regular medicines. This is especially important if you are taking diabetes medicines or blood thinners.  Taking medicines such as aspirin and ibuprofen. These medicines can thin your blood. Do not take these medicines unless your health care provider tells you to take them.  Taking over-the-counter medicines, vitamins, herbs, and supplements. Tests and exams  You will have a physical exam.  You may have blood tests done to show: ? How well your kidneys and liver are working. ? How well your blood can clot. General instructions  Plan  to have a responsible adult take you home from the hospital or clinic.  If you will be going home right after the procedure, plan to have a responsible adult care for you for the time you are told. This is important. What happens during the procedure?  Your blood pressure, heart rate, breathing, level of pain, and overall condition will be monitored.  An IV will be inserted into one of your veins.  You will be given medicines as needed to keep you comfortable during the procedure. This may mean changing the level of sedation. ? Depending on your age or the procedure, the sedative may be given:  As a pill that you will swallow or as a pill that is inserted into the rectum.  As an injection into the vein or muscle.  As a spray through the nose.  The procedure will be performed.  Your  breathing, heart rate, and blood pressure will be monitored during the procedure.  When the procedure is over, the medicine will be stopped. The procedure may vary among health care providers and hospitals.   What happens after the procedure?  Your blood pressure, heart rate, breathing rate, and blood oxygen level will be monitored until you leave the hospital or clinic.  You may feel sleepy, clumsy, or nauseous.  You may feel forgetful about what happened after the procedure.  You may vomit.  You may continue to get IV fluids.  Do not drive or operate machinery until your health care provider says that it is safe. Summary  Monitored anesthesia care is used to keep a patient comfortable during short procedures.  Tell your health care provider about any allergies or health conditions you have and about all the medicines you are taking.  Before the procedure, follow instructions about when to stop eating and drinking and about changing or stopping any medicines.  Your blood pressure, heart rate, breathing rate, and blood oxygen level will be monitored until you leave the hospital or clinic.  Plan to have a responsible adult take you home from the hospital or clinic. This information is not intended to replace advice given to you by your health care provider. Make sure you discuss any questions you have with your health care provider. Document Revised: 01/30/2020 Document Reviewed: 04/18/2019 Elsevier Patient Education  2021 [employer].

## 2020-09-10 NOTE — Op Note (Addendum)
[facility] Patient Name: Donna Riley Procedure Date : 09/10/2020 MRN: [id number] Attending MD: Justice Britain , MD Date of Birth: 14-Feb-1961 CSN: [id number] Age: 60 Admit Type: Outpatient Procedure:                Lower EUS Indications:              Rectal deformity found on endoscopy; subepithelial                            tumor versus extrinsic compression, Sigmoid                            deformity found on endoscopy; subepithelial tumor                            versus extrinsic compression, Neuroendocrine Tumor Providers:                Justice Britain, MD, Kary Kos RN, RN, Elspeth Cho Tech., Technician, Edmonia James, CRNA Referring MD:             Lennette Bihari. Bonna Gains MD, MD, Melissa C. Dorien Chihuahua, MD Medicines:                Monitored Anesthesia Care Complications:            No immediate complications. Estimated Blood Loss:     Estimated blood loss was minimal. Procedure:                Pre-Anesthesia Assessment:                           - Prior to the procedure, a History and Physical                            was performed, and patient medications and                            allergies were reviewed. The patient's tolerance of                            previous anesthesia was also reviewed. The risks                            and benefits of the procedure and the sedation                            options and risks were discussed with the patient.                            All questions were answered, and informed consent  was obtained. Prior Anticoagulants: The patient has                            taken no previous anticoagulant or antiplatelet                            agents except for aspirin. ASA Grade Assessment:                            III - A patient with severe systemic disease. After                            reviewing  the risks and benefits, the patient was                            deemed in satisfactory condition to undergo the                            procedure.                           After obtaining informed consent, the endoscope was                            passed under direct vision. Throughout the                            procedure, the patient's blood pressure, pulse, and                            oxygen saturations were monitored continuously. The                            [id number]-[id number] ([id number] Olympus therapeutic                            gastroscope was introduced through the anus and                            advanced to the the descending colon. The                            [id number] ([id number]) Olympus forward view EUS scope                            was introduced through the anus and advanced to the                            the sigmoid colon for ultrasound. The lower EUS was                            accomplished without difficulty. The patient  tolerated the procedure. The quality of the bowel                            preparation was adequate. Scope In: Scope Out: Findings:      The digital rectal exam findings include hemorrhoids. Pertinent       negatives include no palpable rectal lesions.      ENDOSCOPIC FINDING: :      A moderate amount of liquid and semi-liquid stool was found in the       entire colon, interfering with visualization. Lavage of the area was       performed using copious amounts, resulting in clearance with adequate       visualization.      Normal mucosa was found in the sigmoid colon and in the descending colon.      Two endoclips were found in the proximal rectum. Removal was       accomplished with a snare to pull them off.      A tattoo was seen in the proximal rectum and in the recto-sigmoid colon.       A scar was found in the middle of the tattoo site at 13 cm.      An area of nodular mucosa was found in  the proximal rectum at 13 cm       within the tattoo. After the completion of the EUS, preparations were       made for mucosal resection. NBI imaging and White-light endoscopy was       done to demarcate the borders of the lesion. Orise gel was injected to       raise the lesion which did lift partially but was not as adequate a lift       as normal likely due to the fibrosis from the clips being in place. Band       ligator and snare mucosal resection was performed. Resection and       retrieval were complete. To prevent bleeding after mucosal resection,       four hemostatic clips were successfully placed (MR conditional). There       was no bleeding during, or at the end, of the procedure.      Non-bleeding non-thrombosed external and internal hemorrhoids were found       during retroflexion, during perianal exam and during digital exam. The       hemorrhoids were Grade II (internal hemorrhoids that prolapse but reduce       spontaneously).      ENDOSONOGRAPHIC FINDING: :      The sigmoid colon was normal.      The perirectal space was normal.      A lobulated intramural (subepithelial) lesion was found in the rectum.       The lesion was encountered at 13.0-14.0 cm (from the anal verge). The       lesion was non-circumferential. The lesion was hypoechoic.       Sonographically, the origin appeared to be within the luminal       interface/superficial mucosa (Layer 1) and deep mucosa (Layer 2). The       lesion measured up to 8.1 mm by 8.0 mm in thickness and diamter. The       endosonographic borders were irregular.      The rectum was otherwise normal. Impression:  FLEX Impression:                           - Hemorrhoids found on digital rectal exam.                           - Stool in the entire examined colon - lavaged with                            adequate visualization.                           - Normal mucosa in the sigmoid colon and in the                             descending colon.                           - Two endolips noted and removed with snare.                           - A tattoo was seen in the proximal rectum and in                            the recto-sigmoid colon. A scar was found at the                            tattoo site.                           - Nodular mucosa in the proximal rectum at 13 cm                            (where the previous clips were and inbetween the                            tattoo). After EUS, mucosal resection was                            performed. Resection and retrieval were complete.                            Clips (MR conditional) were placed.                           - Non-bleeding non-thrombosed external and internal                            hemorrhoids.                           EUS Impression:                           - Endosonographic imaging showed no sign of  significant pathology in the sigmoid colon.                           - Endosonographic images of the perirectal space                            were unremarkable.                           - An intramural (subepithelial) lesion was                            visualized endosonographically in the rectum. The                            origin of the lesion appeared to be within the                            luminal interface/superficial mucosa (Layer 1) and                            deep mucosa (Layer 2). A tissue diagnosis was                            obtained prior to this exam. This is consistent                            with Neuroendocrine tumor.                           - Endosonographic images of the rectum were                            otherwise unremarkable. Recommendation:           - The patient will be observed post-procedure,                            until all discharge criteria are met.                           - Discharge patient to home.                           - Patient  has a contact number available for                            emergencies. The signs and symptoms of potential                            delayed complications were discussed with the                            patient. Return to normal activities tomorrow.  Written discharge instructions were provided to the                            patient.                           - High fiber diet.                           - Use FiberCon 1-2 tablets PO daily.                           - Await path results.                           - Repeat lower endoscopic ultrasound in 54-months                            if negative margins. If positive margins, then                            repeat Flex/EUS in 6 months for surveillance.                           - Obtain Chromogranin A level today if possible.                           - Will likely also consider cross-sectional imaging                            in the future as well (CTAP) but will discuss with                            primary GI and Oncology.                           - Recommend, based on Anesthesia advices as well,                            that primary see if Cardiology evaluation be                            expedited for any further workup of her symptoms                            and suspected coronary athereosclerosis and                            potential claudication symptoms in setting of known                            diabetes.                           - The findings and recommendations were discussed  with the patient.                           - The findings and recommendations were discussed                            with the patient's family. Procedure Code(s):        --- Professional ---                           [phone], Sigmoidoscopy, flexible; with endoscopic                            mucosal resection                           [id number], Sigmoidoscopy, flexible;  with endoscopic                            ultrasound examination                           [phone], Sigmoidoscopy, flexible; with removal of                            foreign body(s) Diagnosis Code(s):        --- Professional ---                           K64.1, Second degree hemorrhoids                           T18.5XXA, Foreign body in anus and rectum, initial                            encounter                           K62.89, Other specified diseases of anus and rectum                           K63.89, Other specified diseases of intestine CPT copyright 2019 American [employer] Association. All rights reserved. The codes documented in this report are preliminary and upon coder review may  be revised to meet current compliance requirements. Justice Britain, MD 09/10/2020 9:30:21 AM Number of Addenda: 0

## 2020-09-10 NOTE — Transfer of Care (Signed)
Immediate Anesthesia Transfer of Care Note  Patient: Donna Riley  Procedure(s) Performed: LOWER ENDOSCOPIC ULTRASOUND (EUS) (N/A ) ENDOSCOPIC MUCOSAL RESECTION (N/A ) FOREIGN BODY REMOVAL  HEMOSTASIS CLIP PLACEMENT SUBMUCOSAL LIFTING INJECTION  Patient Location: PACU  Anesthesia Type:MAC  Level of Consciousness: awake, alert  and oriented  Airway & Oxygen Therapy: Patient Spontanous Breathing  Post-op Assessment: Report given to RN and Post -op Vital signs reviewed and stable  Post vital signs: Reviewed and stable  Last Vitals:  Vitals Value Taken Time  BP 162/76 09/10/20 0934  Temp 36.4 C 09/10/20 0920  Pulse 64 09/10/20 0939  Resp 19 09/10/20 0939  SpO2 93 % 09/10/20 0939  Vitals shown include unvalidated device data.  Last Pain:  Vitals:   09/10/20 0920  TempSrc:   PainSc: 0-No pain         Complications: No complications documented.

## 2020-09-10 NOTE — Anesthesia Preprocedure Evaluation (Signed)
Anesthesia Evaluation  Patient identified by MRN, date of birth, ID band Patient awake    Reviewed: Allergy & Precautions, NPO status , Patient's Chart, lab work & pertinent test results  History of Anesthesia Complications Negative for: history of anesthetic complications  Airway Mallampati: II  TM Distance: >3 FB Neck ROM: Full    Dental  (+) Dental Advisory Given, Upper Dentures   Pulmonary shortness of breath, COPD, neg recent URI, Current Smoker and Patient abstained from smoking.,  Covid-19 Nucleic Acid Test Results Lab Results      Component                Value               Date                      SARSCOV2NAA              NEGATIVE            09/07/2020                [place]              NEGATIVE            08/26/2020                [place]              NEGATIVE            08/12/2020                [place]              NEGATIVE            10/01/2019                [place]              NEGATIVE            04/12/2019              breath sounds clear to auscultation       Cardiovascular hypertension, Pt. on medications (-) angina+ Peripheral Vascular Disease  (-) Past MI and (-) CHF  Rhythm:Regular  There is no significant pericardial fluid, thickening or pericardial calcification. There is aortic atherosclerosis, as well as atherosclerosis of the great vessels of the mediastinum and the coronary arteries, including calcified atherosclerotic plaque in the left anterior descending coronary artery.   Neuro/Psych negative neurological ROS  negative psych ROS   GI/Hepatic Neg liver ROS, GERD  Controlled,Lab Results      Component                Value               Date                      ALT                      21                  08/10/2020                AST                      25  08/10/2020                ALKPHOS                  79                  08/10/2020                 BILITOT                  0.5                 08/10/2020              Endo/Other  diabetes, Type 2  Renal/GU Lab Results      Component                Value               Date                      CREATININE               0.90                08/10/2020           Lab Results      Component                Value               Date                      K                        3.7                 08/10/2020                Musculoskeletal   Abdominal   Peds  Hematology Lab Results      Component                Value               Date                      WBC                      8.3                 08/10/2020                HGB                      13.5                08/10/2020                HCT                      41.3                08/10/2020                MCV                      85.0  08/10/2020                PLT                      307                 08/10/2020              Anesthesia Other Findings   Reproductive/Obstetrics                             Anesthesia Physical Anesthesia Plan  ASA: III  Anesthesia Plan: MAC   Post-op Pain Management:    Induction: Intravenous  PONV Risk Score and Plan: 1 and Propofol infusion and Treatment may vary due to age or medical condition  Airway Management Planned: Nasal Cannula  Additional Equipment: None  Intra-op Plan:   Post-operative Plan:   Informed Consent: I have reviewed the patients History and Physical, chart, labs and discussed the procedure including the risks, benefits and alternatives for the proposed anesthesia with the patient or authorized representative who has indicated his/her understanding and acceptance.     Dental advisory given  Plan Discussed with: CRNA and Surgeon  Anesthesia Plan Comments:         Anesthesia Quick Evaluation

## 2020-09-10 NOTE — H&P (Signed)
GASTROENTEROLOGY PROCEDURE H&P NOTE   Primary Care Physician: Glean Hess, MD  HPI: Donna Riley is a 60 y.o. female who presents for Flex Sigmoidoscopy/Lower EUS and possible EMR of [place] Neuroendocrine tumor.  Past Medical History:  Diagnosis Date  . Arthritis    shoulders and neck  . Breast cancer ([place]) 2016    Left breast with chemo + rad tx's with lumpectomy.  . Breast wound, left, sequela 06/23/2016  . COPD (chronic obstructive pulmonary disease) ([place])   . Diabetes mellitus without complication ([place]) 09/3612  . Elevated LFTs   . Fatigue 08/01/2016  . GERD (gastroesophageal reflux disease)   . Gout   . Hydradenitis 01/23/2015  . Hyperlipidemia   . Hypertension   . Mastitis 10/15/2016  . Personal history of chemotherapy 2016   left breast ca  . Personal history of radiation therapy 2016   LEFT lumpectomy  . Personal history of radiation therapy   . Positive PPD, treated   . Shortness of breath dyspnea    with exertion   Past Surgical History:  Procedure Laterality Date  . BREAST BIOPSY Left 07/31/2014   [facility] and DCIS   . BREAST BIOPSY Left 03/08/2016   INTRADUCTAL PAPILLOMA   . 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 BREAST LUMPECTOMY Left 04/05/2016   excision of intraductal papilloma, no malignancy or atypia  . BREAST LUMPECTOMY WITH NEEDLE LOCALIZATION Left 04/05/2016   Procedure: BREAST LUMPECTOMY WITH NEEDLE LOCALIZATION;  Surgeon: Hubbard Robinson, MD;  Location: [facility];  Service: General;  Laterality: Left;  . BREAST LUMPECTOMY WITH NEEDLE LOCALIZATION AND AXILLARY SENTINEL LYMPH NODE BX Left 08/27/14  . CHOLECYSTECTOMY  09/07/2013  . COLONOSCOPY WITH PROPOFOL N/A 08/28/2015   Procedure: COLONOSCOPY WITH PROPOFOL;  Surgeon: Lucilla Lame, MD;  Location: [place];  Service: Endoscopy;  Laterality: N/A;  Diabetic oral  . COLONOSCOPY WITH PROPOFOL N/A 08/14/2020   Procedure: COLONOSCOPY WITH PROPOFOL;  Surgeon:  Virgel Manifold, MD;  Location: [facility];  Service: Endoscopy;  Laterality: N/A;  . ESOPHAGOGASTRODUODENOSCOPY  2014   gastritis  . ESOPHAGOGASTRODUODENOSCOPY (EGD) WITH PROPOFOL N/A 04/16/2019   Procedure: ESOPHAGOGASTRODUODENOSCOPY (EGD) WITH PROPOFOL;  Surgeon: Virgel Manifold, MD;  Location: [facility];  Service: Endoscopy;  Laterality: N/A;  . ESOPHAGOGASTRODUODENOSCOPY (EGD) WITH PROPOFOL N/A 10/03/2019   Procedure: ESOPHAGOGASTRODUODENOSCOPY (EGD) WITH PROPOFOL;  Surgeon: Virgel Manifold, MD;  Location: [facility];  Service: Endoscopy;  Laterality: N/A;  . FLEXIBLE SIGMOIDOSCOPY N/A 08/27/2020   Procedure: FLEXIBLE SIGMOIDOSCOPY;  Surgeon: Virgel Manifold, MD;  Location: [facility];  Service: Endoscopy;  Laterality: N/A;  . POLYPECTOMY  08/28/2015   Procedure: POLYPECTOMY INTESTINAL;  Surgeon: Lucilla Lame, MD;  Location: [place];  Service: Endoscopy;;  Rectal polyp  . TUBAL LIGATION Bilateral   . UMBILICAL HERNIA REPAIR N/A 08/09/2016   Procedure: HERNIA REPAIR UMBILICAL ADULT;  Surgeon: Olean Ree, MD;  Location: [facility];  Service: General;  Laterality: N/A;  . VENTRAL HERNIA REPAIR N/A 04/10/2015   Procedure: HERNIA REPAIR VENTRAL ADULT;  Surgeon: Leonie Green, MD;  Location: [facility];  Service: General;  Laterality: N/A;   Current Facility-Administered Medications  Medication Dose Route Frequency Provider Last Rate Last Admin  . 0.9 %  sodium chloride infusion   Intravenous Continuous Mansouraty, Telford Nab., MD      . lactated ringers infusion   Intravenous Continuous Mansouraty, Telford Nab., MD 10 mL/hr at 09/10/20 0713 1,000 mL  at 09/10/20 0713   Facility-Administered Medications Ordered in Other Encounters  Medication Dose Route Frequency Provider Last Rate Last Admin  . propofol (DIPRIVAN) 500 MG/50ML infusion   Intravenous Continuous PRN Edmonia James B, CRNA 11.052 mL/hr at 09/10/20 0807 20 mcg/kg/min at 09/10/20 0807    No Known Allergies Family History  Problem Relation Age of Onset  . Cancer Mother        Pancreatic  . Cancer Father        Throat  . Healthy Sister   . Cancer Sister        liver  . HIV Brother   . Cancer Maternal Grandmother        Breast  . Breast cancer Maternal Grandmother 75   Social History   Socioeconomic History  . Marital status: Widowed    Spouse name: Not on file  . Number of children: 4  . Years of education: Not on file  . Highest education level: 10th grade  Occupational History  . Occupation: Disabled  Tobacco Use  . Smoking status: Current Every Day Smoker    Packs/day: 0.50    Years: 44.00    Pack years: 22.00    Types: Cigarettes  . Smokeless tobacco: Never Used  Vaping Use  . Vaping Use: Never used  Substance and Sexual Activity  . Alcohol use: Yes    Alcohol/week: 2.0 standard drinks    Types: 2 Cans of beer per week    Comment: weekends - beer   . Drug use: No  . Sexual activity: Not Currently  Other Topics Concern  . Not on file  Social History Narrative   Pt lives alone, son lives 2 doors down.   Social Determinants of Health   Financial Resource Strain: Low Risk   . Difficulty of Paying Living Expenses: Not very hard  Food Insecurity: No Food Insecurity  . Worried About Charity fundraiser in the Last Year: Never true  . Ran Out of Food in the Last Year: Never true  Transportation Needs: No Transportation Needs  . Lack of Transportation (Medical): No  . Lack of Transportation (Non-Medical): No  Physical Activity: Inactive  . Days of Exercise per Week: 0 days  . Minutes of Exercise per Session: 0 min  Stress: No Stress Concern Present  . Feeling of Stress : Only a little  Social Connections: Moderately Isolated  . Frequency of Communication with Friends and Family: More than three times a week  . Frequency of Social Gatherings with Friends and Family: Three times a week  . Attends Religious Services: More than 4 times per  year  . Active Member of Clubs or Organizations: No  . Attends Archivist Meetings: Never  . Marital Status: Widowed  Intimate Partner Violence: Not At Risk  . Fear of Current or Ex-Partner: No  . Emotionally Abused: No  . Physically Abused: No  . Sexually Abused: No    Physical Exam: Vital signs in last 24 hours: Temp:  [97.3 F (36.3 C)] 97.3 F (36.3 C) (04/14 0655) Pulse Rate:  [71] 71 (04/14 0655) Resp:  [19] 19 (04/14 0655) BP: (136)/(55) 136/55 (04/14 0655) SpO2:  [100 %] 100 % (04/14 0655) Weight:  [92.1 kg] 92.1 kg (04/14 0655)   GEN: NAD EYE: Sclerae anicteric ENT: MMM CV: Non-tachycardic GI: Soft, NT/ND NEURO:  Alert & Oriented x 3  Lab Results: No results for input(s): WBC, HGB, HCT, PLT in the last 72 hours. BMET No results  for input(s): NA, K, CL, CO2, GLUCOSE, BUN, CREATININE, CALCIUM in the last 72 hours. LFT No results for input(s): PROT, ALBUMIN, AST, ALT, ALKPHOS, BILITOT, BILIDIR, IBILI in the last 72 hours. PT/INR No results for input(s): LABPROT, INR in the last 72 hours.   Impression / Plan: This is a 60 y.o.female who presents for Flex Sigmoidoscopy/Lower EUS and possible EMR of [place] Neuroendocrine tumor.  The risks of an EUS including intestinal perforation, bleeding, infection, aspiration, and medication effects were discussed as was the possibility it may not give a definitive diagnosis if a biopsy is performed.    The risks and benefits of endoscopic evaluation were discussed with the patient; these include but are not limited to the risk of perforation, infection, bleeding, missed lesions, lack of diagnosis, severe illness requiring hospitalization, as well as anesthesia and sedation related illnesses.  The patient is agreeable to proceed.    Justice Britain, MD [facility] Advanced Endoscopy Office # [phone]

## 2020-09-11 ENCOUNTER — Encounter (HOSPITAL_COMMUNITY): Payer: Self-pay | Admitting: Gastroenterology

## 2020-09-11 NOTE — Anesthesia Postprocedure Evaluation (Signed)
Anesthesia Post Note  Patient: JORDYN HOFACKER  Procedure(s) Performed: LOWER ENDOSCOPIC ULTRASOUND (EUS) (N/A ) ENDOSCOPIC MUCOSAL RESECTION (N/A ) FOREIGN BODY REMOVAL  HEMOSTASIS CLIP PLACEMENT SUBMUCOSAL LIFTING INJECTION     Patient location during evaluation: Endoscopy Anesthesia Type: MAC Level of consciousness: awake and alert Pain management: pain level controlled Vital Signs Assessment: post-procedure vital signs reviewed and stable Respiratory status: spontaneous breathing, nonlabored ventilation, respiratory function stable and patient connected to nasal cannula oxygen Cardiovascular status: stable and blood pressure returned to baseline Postop Assessment: no apparent nausea or vomiting Anesthetic complications: no   No complications documented.  Last Vitals:  Vitals:   09/10/20 0930 09/10/20 0940  BP: (!) 162/76 (!) 166/76  Pulse: 72 64  Resp: (!) 23 19  Temp:  (!) 36.4 C  SpO2: 94% 93%    Last Pain:  Vitals:   09/10/20 0940  TempSrc:   PainSc: 0-No pain                 Caria Transue

## 2020-09-14 LAB — SURGICAL PATHOLOGY

## 2020-09-17 ENCOUNTER — Telehealth: Payer: Self-pay | Admitting: Gastroenterology

## 2020-09-17 NOTE — Telephone Encounter (Signed)
Pt stated that noticed some blood in her stool this morning. Pt reported to be feeling fine just a little bit of mild cramping in her stomach. She would like to know if this is normal as she had a procedure on 4/14 with Dr. Rush Landmark.

## 2020-09-18 ENCOUNTER — Other Ambulatory Visit: Payer: Self-pay | Admitting: Nurse Practitioner

## 2020-09-18 ENCOUNTER — Encounter: Payer: Self-pay | Admitting: Gastroenterology

## 2020-09-18 DIAGNOSIS — D3A8 Other benign neuroendocrine tumors: Secondary | ICD-10-CM

## 2020-09-18 NOTE — Telephone Encounter (Signed)
I spoke with the pt and she tells me she had one episode of BRBPR yesterday but today with her bowel movement she is fine.  No bleeding.  She will call back if the bleeding returns

## 2020-09-18 NOTE — Progress Notes (Signed)
A. RECTUM, PROXIMAL, EMR:  - Well differentiated neuroendocrine tumor (carcinoid).  - Neuroendocrine tumor less than 0.1 cm from cauterized margin.   Dr. Rush Landmark recommends patient get baseline chromagranin a and ct abdomen pelvis. Follow up with him in 1 year and medical oncology at [facility] as scheduled.

## 2020-09-18 NOTE — Progress Notes (Signed)
Orders entered & scheduling message sent

## 2020-09-25 ENCOUNTER — Other Ambulatory Visit: Payer: Self-pay

## 2020-09-25 DIAGNOSIS — D3A8 Other benign neuroendocrine tumors: Secondary | ICD-10-CM

## 2020-09-25 DIAGNOSIS — C50912 Malignant neoplasm of unspecified site of left female breast: Secondary | ICD-10-CM

## 2020-09-25 DIAGNOSIS — Z170 Estrogen receptor positive status [ER+]: Secondary | ICD-10-CM

## 2020-09-30 ENCOUNTER — Inpatient Hospital Stay: Payer: Medicare Other | Attending: Oncology

## 2020-09-30 ENCOUNTER — Other Ambulatory Visit: Payer: Self-pay

## 2020-09-30 ENCOUNTER — Ambulatory Visit
Admission: RE | Admit: 2020-09-30 | Discharge: 2020-09-30 | Disposition: A | Discharge status: Home / Self Care | Payer: Medicare Other | Source: Ambulatory Visit | Attending: Nurse Practitioner | Admitting: Nurse Practitioner

## 2020-09-30 DIAGNOSIS — Z170 Estrogen receptor positive status [ER+]: Secondary | ICD-10-CM | POA: Diagnosis not present

## 2020-09-30 DIAGNOSIS — D3A8 Other benign neuroendocrine tumors: Secondary | ICD-10-CM

## 2020-09-30 DIAGNOSIS — C50912 Malignant neoplasm of unspecified site of left female breast: Secondary | ICD-10-CM | POA: Diagnosis not present

## 2020-09-30 DIAGNOSIS — C773 Secondary and unspecified malignant neoplasm of axilla and upper limb lymph nodes: Secondary | ICD-10-CM | POA: Diagnosis not present

## 2020-09-30 DIAGNOSIS — K760 Fatty (change of) liver, not elsewhere classified: Secondary | ICD-10-CM | POA: Diagnosis not present

## 2020-09-30 LAB — POCT I-STAT CREATININE: Creatinine, Ser: 0.9 mg/dL (ref 0.44–1.00)

## 2020-09-30 MED ORDER — IOHEXOL 300 MG/ML  SOLN
100.0000 mL | Freq: Once | INTRAMUSCULAR | Status: AC | PRN
  Administered 2020-09-30: 100 mL via INTRAVENOUS

## 2020-10-01 LAB — CHROMOGRANIN A: Chromogranin A (ng/mL): 49.6 ng/mL (ref 0.0–101.8)

## 2020-10-06 ENCOUNTER — Telehealth: Payer: Self-pay

## 2020-10-06 DIAGNOSIS — D2371 Other benign neoplasm of skin of right lower limb, including hip: Secondary | ICD-10-CM | POA: Diagnosis not present

## 2020-10-06 NOTE — Telephone Encounter (Signed)
Pt aware of CT scan results showing no evidence of cancer. Follow up as scheduled. Pt understood next f/u appt on 8/1/202. Pt is satisfied.

## 2020-10-09 ENCOUNTER — Other Ambulatory Visit: Payer: Self-pay | Admitting: Internal Medicine

## 2020-10-09 DIAGNOSIS — I10 Essential (primary) hypertension: Secondary | ICD-10-CM

## 2020-10-12 ENCOUNTER — Ambulatory Visit: Payer: Self-pay | Admitting: *Deleted

## 2020-10-12 ENCOUNTER — Ambulatory Visit (INDEPENDENT_AMBULATORY_CARE_PROVIDER_SITE_OTHER): Payer: Medicare Other | Admitting: Internal Medicine

## 2020-10-12 ENCOUNTER — Other Ambulatory Visit: Payer: Self-pay

## 2020-10-12 ENCOUNTER — Encounter: Payer: Self-pay | Admitting: Internal Medicine

## 2020-10-12 VITALS — BP 138/62 | HR 96 | Ht 68.0 in | Wt 201.0 lb

## 2020-10-12 DIAGNOSIS — D3A8 Other benign neuroendocrine tumors: Secondary | ICD-10-CM | POA: Diagnosis not present

## 2020-10-12 DIAGNOSIS — R102 Pelvic and perineal pain unspecified side: Secondary | ICD-10-CM

## 2020-10-12 DIAGNOSIS — R310 Gross hematuria: Secondary | ICD-10-CM

## 2020-10-12 DIAGNOSIS — E1169 Type 2 diabetes mellitus with other specified complication: Secondary | ICD-10-CM

## 2020-10-12 DIAGNOSIS — E785 Hyperlipidemia, unspecified: Secondary | ICD-10-CM | POA: Diagnosis not present

## 2020-10-12 LAB — POC URINALYSIS WITH MICROSCOPIC (NON AUTO)MANUAL RESULT
Bilirubin, UA: NEGATIVE
Epithelial cells, urine per micros: 5
Glucose, UA: NEGATIVE
Ketones, UA: NEGATIVE
Leukocytes, UA: NEGATIVE
Mucus, UA: 0
Nitrite, UA: NEGATIVE
Protein, UA: NEGATIVE
RBC: 5 M/uL (ref 4.04–5.48)
Spec Grav, UA: 1.02 (ref 1.010–1.025)
Urobilinogen, UA: 0.2 E.U./dL
WBC Casts, UA: 5
pH, UA: 5 (ref 5.0–8.0)

## 2020-10-12 MED ORDER — NITROFURANTOIN MONOHYD MACRO 100 MG PO CAPS: 100.0000 mg | ORAL_CAPSULE | Freq: Two times a day (BID) | ORAL | 0 refills | Status: AC

## 2020-10-12 NOTE — Telephone Encounter (Signed)
  Answer Assessment - Initial Assessment Questions 1. SEVERITY: "How bad is the pain?"  (e.g., Scale 1-10; mild, moderate, or severe)   - MILD (1-3): complains slightly about urination hurting   - MODERATE (4-7): interferes with normal activities     - SEVERE (8-10): excruciating, unwilling or unable to urinate because of the pain      Moderate to severe 2. FREQUENCY: "How many times have you had painful urination today?"      3. PATTERN: "Is pain present every time you urinate or just sometimes?"    intermittent 4. ONSET: "When did the painful urination start?"      10/11/20 5. FEVER: "Do you have a fever?" If Yes, ask: "What is your temperature, how was it measured, and when did it start?" no 6. PAST UTI: "Have you had a urine infection before?" If Yes, ask: "When was the last time?" and "What happened that time?"      Yes, feels the same 7. CAUSE: "What do you think is causing the painful urination?"  (e.g., UTI, scratch, Herpes sore)     UTI 8. OTHER SYMPTOMS: "Do you have any other symptoms?" (e.g., flank pain, vaginal discharge, genital sores, urgency, blood in urine)     Left lower abdomen pain; "urine smells funny"; urinary frequency 9. PREGNANCY: "Is there any chance you are pregnant?" "When was your last menstrual period?"  Protocols used: [place]

## 2020-10-12 NOTE — Progress Notes (Signed)
Date:  10/12/2020   Name:  Donna Riley   DOB:  02-13-1961   MRN:  [id number]   Chief Complaint: Flank Pain (Left flank pain. Patient feels she might have a UTI and wants her urine checked.)  Flank Pain This is a new problem. The current episode started in the past 7 days. The problem occurs intermittently. The quality of the pain is described as aching. The pain does not radiate. The pain is mild. Exacerbated by: nothing. Associated symptoms include abdominal pain and dysuria (and possible blood ). Pertinent negatives include no chest pain, fever or headaches.    Lab Results  Component Value Date   CREATININE 0.90 09/30/2020   BUN 17 08/10/2020   NA 134 (L) 08/10/2020   K 3.7 08/10/2020   CL 101 08/10/2020   CO2 21 (L) 08/10/2020   Lab Results  Component Value Date   CHOL 159 07/24/2020   HDL 37 (L) 07/24/2020   LDLCALC 62 07/24/2020   TRIG 301 (H) 07/24/2020   CHOLHDL 4.3 07/24/2020   Lab Results  Component Value Date   TSH 1.765 04/11/2018   Lab Results  Component Value Date   HGBA1C 6.2 (H) 07/24/2020   Lab Results  Component Value Date   WBC 8.3 08/10/2020   HGB 13.5 08/10/2020   HCT 41.3 08/10/2020   MCV 85.0 08/10/2020   PLT 307 08/10/2020   Lab Results  Component Value Date   ALT 21 08/10/2020   AST 25 08/10/2020   ALKPHOS 79 08/10/2020   BILITOT 0.5 08/10/2020     Review of Systems  Constitutional: Negative for chills, fatigue and fever.  Respiratory: Negative for chest tightness and shortness of breath.   Cardiovascular: Positive for leg swelling. Negative for chest pain and palpitations.  Gastrointestinal: Positive for abdominal pain. Negative for blood in stool, constipation and diarrhea.  Genitourinary: Positive for dysuria (and possible blood ) and flank pain. Negative for frequency and hematuria.  Neurological: Negative for dizziness and headaches.    Patient Active Problem List   Diagnosis Date Noted  . Smoking history 08/29/2020   . Benign neuroendocrine tumor of sigmoid colon   . History of rectal polyps   . Nodule of colon   . Polyp of ascending colon   . Polyp of descending colon   . Polyp of sigmoid colon   . Sebaceous cyst 08/11/2020  . Osteopenia of lumbar spine 07/19/2020  . Breast pain, right 07/03/2020  . Screening for condition 06/10/2020  . Atherosclerosis of abdominal aorta ([place]) 02/06/2020  . Tobacco use disorder, continuous 01/14/2020  . Helicobacter pylori gastritis 06/03/2019  . Abdominal bloating   . Nausea   . 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 Lumbosacral spondylosis without myelopathy 11/22/2018  . Vertigo 10/09/2018  . Acute midline low back pain without sciatica 10/09/2018  . Hyperlipidemia associated with type 2 diabetes mellitus ([place]) 04/11/2018  . Gastroparesis 04/11/2018  . Long term (current) use of aromatase inhibitors 12/25/2017  . Arthritis   . Elevated LFTs   . Positive PPD, treated   . Umbilical hernia without obstruction and without gangrene 07/13/2016  . Rectal polyp   . Irritable bowel syndrome with both constipation and diarrhea 07/20/2015  . Type II diabetes mellitus with complication ([place]) [id number]  . Pre-ulcerative calluses 06/22/2015  . Neuropathy 06/22/2015  . Hot flash, menopausal 04/09/2015  . Atherosclerotic peripheral vascular disease with intermittent claudication ([place]) 03/25/2015  .  Osteoporosis of lumbar spine 03/12/2015  . Malignant neoplasm of left breast in female, estrogen receptor positive ([place]) 05/30/2014  . 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 BREAST BIOPSY Left 07/31/2014   [facility] ([place] and DCIS   . BREAST BIOPSY Left 03/08/2016   INTRADUCTAL PAPILLOMA   . 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 BREAST LUMPECTOMY Left 04/05/2016   excision of intraductal papilloma, no  malignancy or atypia  . BREAST LUMPECTOMY WITH NEEDLE LOCALIZATION Left 04/05/2016   Procedure: BREAST LUMPECTOMY WITH NEEDLE LOCALIZATION;  Surgeon: Hubbard Robinson, MD;  Location: [facility];  Service: General;  Laterality: Left;  . BREAST LUMPECTOMY WITH NEEDLE LOCALIZATION AND AXILLARY SENTINEL LYMPH NODE BX Left 08/27/14  . CHOLECYSTECTOMY  09/07/2013  . COLONOSCOPY WITH PROPOFOL N/A 08/28/2015   Procedure: COLONOSCOPY WITH PROPOFOL;  Surgeon: Lucilla Lame, MD;  Location: [place];  Service: Endoscopy;  Laterality: N/A;  Diabetic oral  . COLONOSCOPY WITH PROPOFOL N/A 08/14/2020   Procedure: COLONOSCOPY WITH PROPOFOL;  Surgeon: Virgel Manifold, MD;  Location: [facility];  Service: Endoscopy;  Laterality: N/A;  . ENDOSCOPIC MUCOSAL RESECTION N/A 09/10/2020   Procedure: ENDOSCOPIC MUCOSAL RESECTION;  Surgeon: Rush Landmark Telford Nab., MD;  Location: [place];  Service: Gastroenterology;  Laterality: N/A;  . ESOPHAGOGASTRODUODENOSCOPY  2014   gastritis  . ESOPHAGOGASTRODUODENOSCOPY (EGD) WITH PROPOFOL N/A 04/16/2019   Procedure: ESOPHAGOGASTRODUODENOSCOPY (EGD) WITH PROPOFOL;  Surgeon: Virgel Manifold, MD;  Location: [facility];  Service: Endoscopy;  Laterality: N/A;  . ESOPHAGOGASTRODUODENOSCOPY (EGD) WITH PROPOFOL N/A 10/03/2019   Procedure: ESOPHAGOGASTRODUODENOSCOPY (EGD) WITH PROPOFOL;  Surgeon: Virgel Manifold, MD;  Location: [facility];  Service: Endoscopy;  Laterality: N/A;  . EUS N/A 09/10/2020   Procedure: LOWER ENDOSCOPIC ULTRASOUND (EUS);  Surgeon: Irving Copas., MD;  Location: [place];  Service: Gastroenterology;  Laterality: N/A;  . FLEXIBLE SIGMOIDOSCOPY N/A 08/27/2020   Procedure: FLEXIBLE SIGMOIDOSCOPY;  Surgeon: Virgel Manifold, MD;  Location: [facility];  Service: Endoscopy;  Laterality: N/A;  . FLEXIBLE SIGMOIDOSCOPY N/A 09/10/2020   Procedure: FLEXIBLE SIGMOIDOSCOPY;  Surgeon: Rush Landmark Telford Nab., MD;  Location: [place];  Service: Gastroenterology;  Laterality: N/A;  . FOREIGN BODY REMOVAL  09/10/2020   Procedure: FOREIGN BODY REMOVAL ;  Surgeon: Rush Landmark Telford Nab., MD;  Location: [place];  Service: Gastroenterology;;  . HEMOSTASIS CLIP PLACEMENT  09/10/2020   Procedure: HEMOSTASIS CLIP PLACEMENT;  Surgeon: Irving Copas., MD;  Location: [place];  Service: Gastroenterology;;  . POLYPECTOMY  08/28/2015   Procedure: POLYPECTOMY INTESTINAL;  Surgeon: Lucilla Lame, MD;  Location: [place];  Service: Endoscopy;;  Rectal polyp  . SUBMUCOSAL LIFTING INJECTION  09/10/2020   Procedure: SUBMUCOSAL LIFTING INJECTION;  Surgeon: Rush Landmark Telford Nab., MD;  Location: [place];  Service: Gastroenterology;;  . TUBAL LIGATION Bilateral   . UMBILICAL HERNIA REPAIR N/A 08/09/2016   Procedure: HERNIA REPAIR UMBILICAL ADULT;  Surgeon: Olean Ree, MD;  Location: [facility];  Service: General;  Laterality: N/A;  . VENTRAL HERNIA REPAIR N/A 04/10/2015   Procedure: HERNIA REPAIR VENTRAL ADULT;  Surgeon: Leonie Green, MD;  Location: [facility];  Service: General;  Laterality: N/A;    Social History   Tobacco Use  . Smoking status: Current Every Day Smoker    Packs/day: 0.50    Years: 44.00    Pack years: 22.00    Types: Cigarettes  .  Smokeless tobacco: Never Used  Vaping Use  . Vaping Use: Never used  Substance Use Topics  . Alcohol use: Yes    Alcohol/week: 2.0 standard drinks    Types: 2 Cans of beer per week    Comment: weekends - beer   . Drug use: No     Medication list has been reviewed and updated.  Current Meds  Medication Sig  . acetaminophen (TYLENOL) 325 MG tablet Take 650 mg by mouth daily as needed for moderate pain.  . Alcohol Swabs (B-D SINGLE USE SWABS REGULAR) PADS 1 each by Does not apply route 2 (two) times daily.  Marland Kitchen amLODipine (NORVASC) 10 MG tablet TAKE TWO TABLETS BY MOUTH ONCE DAILY (Patient taking differently: Take 20 mg by mouth daily.)  .  aspirin EC 81 MG tablet Take 81 mg by mouth daily.  Marland Kitchen atorvastatin (LIPITOR) 10 MG tablet Take 1 tablet (10 mg total) by mouth daily.  . Blood Glucose Calibration (TRUE METRIX LEVEL 1) Low SOLN 1 each by In Vitro route daily as needed.  . Blood Glucose Monitoring Suppl (TRUE METRIX METER) DEVI 1 each by Does not apply route daily.  . calcium-vitamin D (OSCAL WITH D) 500-200 MG-UNIT TABS tablet Take 1 tablet by mouth daily.  . hydrochlorothiazide (HYDRODIURIL) 25 MG tablet Take 25 mg by mouth daily.   . irbesartan (AVAPRO) 300 MG tablet TAKE ONE TABLET BY MOUTH ONCE DAILY  . metFORMIN (GLUCOPHAGE-XR) 500 MG 24 hr tablet TAKE ONE TABLET BY MOUTH EVERY MORNING WITH BREAKFAST (Patient taking differently: Take 500 mg by mouth daily with breakfast.)    PHQ 2/9 Scores 10/12/2020 07/24/2020 07/13/2020 10/01/2019  PHQ - 2 Score 0 0 0 0  PHQ- 9 Score 0 0 - 0    GAD 7 : Generalized Anxiety Score 10/12/2020 07/24/2020 10/01/2019  Nervous, Anxious, on Edge 0 0 0  Control/stop worrying 0 0 0  Worry too much - different things 0 0 0  Trouble relaxing 0 0 0  Restless 0 0 0  Easily annoyed or irritable 0 0 0  Afraid - awful might happen 0 0 0  Total GAD 7 Score 0 0 0  Anxiety Difficulty Not difficult at all - Not difficult at all    BP Readings from Last 3 Encounters:  10/12/20 138/62  09/10/20 (!) 166/76  08/27/20 (!) 187/90    Physical Exam Vitals and nursing note reviewed.  Constitutional:      General: She is not in acute distress.    Appearance: Normal appearance. She is well-developed.  HENT:     Head: Normocephalic and atraumatic.  Cardiovascular:     Rate and Rhythm: Normal rate and regular rhythm.  Pulmonary:     Effort: Pulmonary effort is normal. No respiratory distress.     Breath sounds: No wheezing or rhonchi.  Abdominal:     General: There is no distension.     Palpations: Abdomen is soft. There is no mass.     Tenderness: There is no abdominal tenderness. There is no right CVA  tenderness, left CVA tenderness, guarding or rebound.  Skin:    General: Skin is warm and dry.     Capillary Refill: Capillary refill takes less than 2 seconds.     Findings: No rash.  Neurological:     General: No focal deficit present.     Mental Status: She is alert and oriented to person, place, and time.  Psychiatric:        Mood and  Affect: Mood normal.        Behavior: Behavior normal.     Wt Readings from Last 3 Encounters:  10/12/20 201 lb (91.2 kg)  09/10/20 203 lb (92.1 kg)  08/27/20 203 lb (92.1 kg)    BP 138/62   Pulse 96   Ht 5\' 8"  (1.727 m)   Wt 201 lb (91.2 kg)   SpO2 96%   BMI 30.56 kg/m   Assessment and Plan: 1. Gross hematuria Noted on UA but only minimal WBC and bacteria Will treat presumptively - POC urinalysis w microscopic (non auto) - nitrofurantoin, macrocrystal-monohydrate, (MACROBID) 100 MG capsule; Take 1 capsule (100 mg total) by mouth 2 (two) times daily for 5 days.  Dispense: 10 capsule; Refill: 0  2. Pelvic cramping - POC urinalysis w microscopic (non auto) - nitrofurantoin, macrocrystal-monohydrate, (MACROBID) 100 MG capsule; Take 1 capsule (100 mg total) by mouth 2 (two) times daily for 5 days.  Dispense: 10 capsule; Refill: 0  3. Hyperlipidemia associated with type 2 diabetes mellitus ([place]) She has not tolerated lipitor due to muscle cramps  4. Benign neuroendocrine tumor of sigmoid colon Removed endoscopically. Plans for scope every 6 months   Partially dictated using Dragon software. Any errors are unintentional.  Halina Maidens, MD [facility]  10/12/2020

## 2020-10-12 NOTE — Telephone Encounter (Signed)
Pt called with complaints of left side cramping that started 10/11/20; her pain is intermittent and rated 8 out of 10; she says her urine has a funny smell; left lower abdomen; the pt says she has the urge to urinate frequently but has little output; she has taken Tylenol but her pain is not relieved;  the pt states she had a colon tumor removed last month; recommendations made per nurse triage protocol; unsuccessful attempt to schedule pt for appt with Dr Army Melia due to call dropped; spoke with [facility] and she says the pt is scheduled to be seen at 1620 on 10/12/20; pt confirmed appt; will route to office for notification.  Reason for Disposition . [1] MODERATE pain (e.g., interferes with normal activities) AND [2] pain comes and goes (cramps) AND [3] present > 24 hours  (Exception: pain with Vomiting or Diarrhea - see that Guideline) . Diabetes mellitus or weak immune system (e.g., HIV positive, cancer chemo, splenectomy, organ transplant, chronic steroids)  Answer Assessment - Initial Assessment Questions 1. LOCATION: "Where does it hurt?"      lef 2. RADIATION: "Does the pain shoot anywhere else?" (e.g., chest, back)     *No Answer* 3. ONSET: "When did the pain begin?" (e.g., minutes, hours or days ago)      *No Answer* 4. SUDDEN: "Gradual or sudden onset?"     *No Answer* 5. PATTERN "Does the pain come and go, or is it constant?"    - If constant: "Is it getting better, staying the same, or worsening?"      (Note: Constant means the pain never goes away completely; most serious pain is constant and it progresses)     - If intermittent: "How long does it last?" "Do you have pain now?"     (Note: Intermittent means the pain goes away completely between bouts)     *No Answer* 6. SEVERITY: "How bad is the pain?"  (e.g., Scale 1-10; mild, moderate, or severe)   - MILD (1-3): doesn't interfere with normal activities, abdomen soft and not tender to touch    - MODERATE (4-7): interferes with normal  activities or awakens from sleep, abdomen tender to touch    - SEVERE (8-10): excruciating pain, doubled over, unable to do any normal activities      *No Answer* 7. RECURRENT SYMPTOM: "Have you ever had this type of stomach pain before?" If Yes, ask: "When was the last time?" and "What happened that time?"      *No Answer* 8. CAUSE: "What do you think is causing the stomach pain?"     *No Answer* 9. RELIEVING/AGGRAVATING FACTORS: "What makes it better or worse?" (e.g., movement, antacids, bowel movement)     *No Answer* 10. OTHER SYMPTOMS: "Do you have any other symptoms?" (e.g., back pain, diarrhea, fever, urination pain, vomiting)       *No Answer* 11. PREGNANCY: "Is there any chance you are pregnant?" "When was your last menstrual period?"       *No Answer*  Protocols used: ABDOMINAL PAIN - FEMALE-A-AH, [place]

## 2020-10-19 ENCOUNTER — Other Ambulatory Visit: Payer: Self-pay

## 2020-10-19 ENCOUNTER — Emergency Department
Admission: EM | Admit: 2020-10-19 | Discharge: 2020-10-19 | Disposition: A | Discharge status: Home / Self Care | Payer: Medicare Other | Attending: Emergency Medicine | Admitting: Emergency Medicine

## 2020-10-19 ENCOUNTER — Emergency Department: Payer: Medicare Other

## 2020-10-19 DIAGNOSIS — R52 Pain, unspecified: Secondary | ICD-10-CM

## 2020-10-19 DIAGNOSIS — M79662 Pain in left lower leg: Secondary | ICD-10-CM | POA: Diagnosis not present

## 2020-10-19 DIAGNOSIS — Z853 Personal history of malignant neoplasm of breast: Secondary | ICD-10-CM | POA: Insufficient documentation

## 2020-10-19 DIAGNOSIS — M7989 Other specified soft tissue disorders: Secondary | ICD-10-CM | POA: Diagnosis not present

## 2020-10-19 DIAGNOSIS — I10 Essential (primary) hypertension: Secondary | ICD-10-CM | POA: Insufficient documentation

## 2020-10-19 DIAGNOSIS — E1169 Type 2 diabetes mellitus with other specified complication: Secondary | ICD-10-CM | POA: Insufficient documentation

## 2020-10-19 DIAGNOSIS — Z7982 Long term (current) use of aspirin: Secondary | ICD-10-CM | POA: Insufficient documentation

## 2020-10-19 DIAGNOSIS — F17210 Nicotine dependence, cigarettes, uncomplicated: Secondary | ICD-10-CM | POA: Diagnosis not present

## 2020-10-19 DIAGNOSIS — Z79899 Other long term (current) drug therapy: Secondary | ICD-10-CM | POA: Diagnosis not present

## 2020-10-19 DIAGNOSIS — M79672 Pain in left foot: Secondary | ICD-10-CM | POA: Diagnosis not present

## 2020-10-19 DIAGNOSIS — Z8509 Personal history of malignant neoplasm of other digestive organs: Secondary | ICD-10-CM | POA: Insufficient documentation

## 2020-10-19 DIAGNOSIS — M19072 Primary osteoarthritis, left ankle and foot: Secondary | ICD-10-CM | POA: Diagnosis not present

## 2020-10-19 DIAGNOSIS — E785 Hyperlipidemia, unspecified: Secondary | ICD-10-CM | POA: Diagnosis not present

## 2020-10-19 DIAGNOSIS — J449 Chronic obstructive pulmonary disease, unspecified: Secondary | ICD-10-CM | POA: Diagnosis not present

## 2020-10-19 DIAGNOSIS — R600 Localized edema: Secondary | ICD-10-CM | POA: Diagnosis not present

## 2020-10-19 DIAGNOSIS — R609 Edema, unspecified: Secondary | ICD-10-CM

## 2020-10-19 MED ORDER — TRAMADOL HCL 50 MG PO TABS: 50.0000 mg | ORAL_TABLET | Freq: Four times a day (QID) | ORAL | 0 refills | Status: DC | PRN

## 2020-10-19 NOTE — ED Notes (Signed)
See triage note  Presents with left foot pain and swelling  States she hit it on the coffee table about 2 weeks ago  conts to have pain with some swelling   Ambulates well good pulses

## 2020-10-19 NOTE — ED Provider Notes (Signed)
[facility] Emergency Department Provider Note   ____________________________________________   Event Date/Time   First MD Initiated Contact with Patient 10/19/20 [phone]     (approximate)  I have reviewed the triage vital signs and the nursing notes.   HISTORY  Chief Complaint Foot Pain    HPI Donna Riley is a 60 y.o. female patient complain of left foot pain secondary to a "stubbing"  incident occurred a few days ago.  Patient states that he had pain and swelling radiating  from foot to inferior aspect of knee..  Pain increased with ambulation and prolonged standing.  Rates pain a 7/10.  Patient is scheduled to see cardiologist secondary to atherosclerosis of the aorta.  Patient states mild dyspnea with exertion.         Past Medical History:  Diagnosis Date  . Arthritis    shoulders and neck  . Breast cancer ([place]) 2016    Left breast with chemo + rad tx's with lumpectomy.  . Breast wound, left, sequela 06/23/2016  . 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 COPD (chronic obstructive pulmonary disease) ([place])   . Diabetes mellitus without complication ([place]) 10/2829  . Elevated LFTs   . Fatigue 08/01/2016  . GERD (gastroesophageal reflux disease)   . Gout   . Hydradenitis 01/23/2015  . Hyperlipidemia   . Hypertension   . Mastitis 10/15/2016  . Personal history of chemotherapy 2016   left breast ca  . Personal history of radiation therapy 2016   LEFT lumpectomy  . Personal history of radiation therapy   . Positive PPD, treated   . Shortness of breath dyspnea    with exertion    Patient Active Problem List   Diagnosis Date Noted  . Smoking history 08/29/2020  . Benign neuroendocrine tumor of sigmoid colon   . History of rectal polyps   . Nodule of colon   . Polyp of ascending colon   . Polyp of descending colon   . Polyp of sigmoid colon   . Sebaceous cyst 08/11/2020  . Osteopenia of lumbar spine 07/19/2020  . Breast pain,  right 07/03/2020  . Screening for condition 06/10/2020  . Atherosclerosis of abdominal aorta ([place]) 02/06/2020  . Tobacco use disorder, continuous 01/14/2020  . Helicobacter pylori gastritis 06/03/2019  . Abdominal bloating   . Nausea   . 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 Lumbosacral spondylosis without myelopathy 11/22/2018  . Vertigo 10/09/2018  . Acute midline low back pain without sciatica 10/09/2018  . Hyperlipidemia associated with type 2 diabetes mellitus ([place]) 04/11/2018  . Gastroparesis 04/11/2018  . Long term (current) use of aromatase inhibitors 12/25/2017  . Arthritis   . Elevated LFTs   . Positive PPD, treated   . Umbilical hernia without obstruction and without gangrene 07/13/2016  . Rectal polyp   . Irritable bowel syndrome with both constipation and diarrhea 07/20/2015  . Type II diabetes mellitus with complication ([place]) 51/76/1607  . Pre-ulcerative calluses 06/22/2015  . Neuropathy 06/22/2015  . Hot flash, menopausal 04/09/2015  . Atherosclerotic peripheral vascular disease with intermittent claudication ([place]) 03/25/2015  . Osteoporosis of lumbar spine 03/12/2015  . Malignant neoplasm of left breast in female, estrogen receptor positive ([place]) 05/30/2014  . 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 BREAST BIOPSY Left 07/31/2014   [facility] and [facility]   . BREAST BIOPSY Left 03/08/2016  INTRADUCTAL PAPILLOMA   . 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 BREAST LUMPECTOMY Left 04/05/2016   excision of intraductal papilloma, no malignancy or atypia  . BREAST LUMPECTOMY WITH NEEDLE LOCALIZATION Left 04/05/2016   Procedure: BREAST LUMPECTOMY WITH NEEDLE LOCALIZATION;  Surgeon: Hubbard Robinson, MD;  Location: [facility];  Service: General;  Laterality: Left;  . BREAST LUMPECTOMY WITH NEEDLE LOCALIZATION AND AXILLARY  SENTINEL LYMPH NODE BX Left 08/27/14  . CHOLECYSTECTOMY  09/07/2013  . COLONOSCOPY WITH PROPOFOL N/A 08/28/2015   Procedure: COLONOSCOPY WITH PROPOFOL;  Surgeon: Lucilla Lame, MD;  Location: [place];  Service: Endoscopy;  Laterality: N/A;  Diabetic oral  . COLONOSCOPY WITH PROPOFOL N/A 08/14/2020   Procedure: COLONOSCOPY WITH PROPOFOL;  Surgeon: Virgel Manifold, MD;  Location: [facility];  Service: Endoscopy;  Laterality: N/A;  . ENDOSCOPIC MUCOSAL RESECTION N/A 09/10/2020   Procedure: ENDOSCOPIC MUCOSAL RESECTION;  Surgeon: Rush Landmark Telford Nab., MD;  Location: [place];  Service: Gastroenterology;  Laterality: N/A;  . ESOPHAGOGASTRODUODENOSCOPY  2014   gastritis  . ESOPHAGOGASTRODUODENOSCOPY (EGD) WITH PROPOFOL N/A 04/16/2019   Procedure: ESOPHAGOGASTRODUODENOSCOPY (EGD) WITH PROPOFOL;  Surgeon: Virgel Manifold, MD;  Location: [facility];  Service: Endoscopy;  Laterality: N/A;  . ESOPHAGOGASTRODUODENOSCOPY (EGD) WITH PROPOFOL N/A 10/03/2019   Procedure: ESOPHAGOGASTRODUODENOSCOPY (EGD) WITH PROPOFOL;  Surgeon: Virgel Manifold, MD;  Location: [facility];  Service: Endoscopy;  Laterality: N/A;  . EUS N/A 09/10/2020   Procedure: LOWER ENDOSCOPIC ULTRASOUND (EUS);  Surgeon: Irving Copas., MD;  Location: [place];  Service: Gastroenterology;  Laterality: N/A;  . FLEXIBLE SIGMOIDOSCOPY N/A 08/27/2020   Procedure: FLEXIBLE SIGMOIDOSCOPY;  Surgeon: Virgel Manifold, MD;  Location: [facility];  Service: Endoscopy;  Laterality: N/A;  . FLEXIBLE SIGMOIDOSCOPY N/A 09/10/2020   Procedure: FLEXIBLE SIGMOIDOSCOPY;  Surgeon: Rush Landmark Telford Nab., MD;  Location: [place];  Service: Gastroenterology;  Laterality: N/A;  . FOREIGN BODY REMOVAL  09/10/2020   Procedure: FOREIGN BODY REMOVAL ;  Surgeon: Rush Landmark Telford Nab., MD;  Location: [place];  Service: Gastroenterology;;  . HEMOSTASIS CLIP PLACEMENT  09/10/2020   Procedure: HEMOSTASIS CLIP  PLACEMENT;  Surgeon: Irving Copas., MD;  Location: [place];  Service: Gastroenterology;;  . POLYPECTOMY  08/28/2015   Procedure: POLYPECTOMY INTESTINAL;  Surgeon: Lucilla Lame, MD;  Location: [place];  Service: Endoscopy;;  Rectal polyp  . SUBMUCOSAL LIFTING INJECTION  09/10/2020   Procedure: SUBMUCOSAL LIFTING INJECTION;  Surgeon: Rush Landmark Telford Nab., MD;  Location: [place];  Service: Gastroenterology;;  . TUBAL LIGATION Bilateral   . UMBILICAL HERNIA REPAIR N/A 08/09/2016   Procedure: HERNIA REPAIR UMBILICAL ADULT;  Surgeon: Olean Ree, MD;  Location: [facility];  Service: General;  Laterality: N/A;  . VENTRAL HERNIA REPAIR N/A 04/10/2015   Procedure: HERNIA REPAIR VENTRAL ADULT;  Surgeon: Leonie Green, MD;  Location: [facility];  Service: General;  Laterality: N/A;    Prior to Admission medications   Medication Sig Start Date End Date Taking? Authorizing Provider  traMADol (ULTRAM) 50 MG tablet Take 1 tablet (50 mg total) by mouth every 6 (six) hours as needed for moderate pain. 10/19/20  Yes Sable Feil, PA-C  acetaminophen (TYLENOL) 325 MG tablet Take 650 mg by mouth daily as needed for moderate pain. 09/08/13   [provider]  Alcohol Swabs (B-D SINGLE USE SWABS REGULAR) PADS 1 each by Does not apply route 2 (two) times daily. 11/22/17   Glean Hess, MD  amLODipine (  NORVASC) 10 MG tablet TAKE TWO TABLETS BY MOUTH ONCE DAILY Patient taking differently: Take 20 mg by mouth daily. 08/21/20   Glean Hess, MD  aspirin EC 81 MG tablet Take 81 mg by mouth daily.    [provider]  atorvastatin (LIPITOR) 10 MG tablet Take 1 tablet (10 mg total) by mouth daily. 07/24/20   Glean Hess, MD  Blood Glucose Calibration (TRUE METRIX LEVEL 1) Low SOLN 1 each by In Vitro route daily as needed. 11/22/17   Glean Hess, MD  Blood Glucose Monitoring Suppl (TRUE METRIX METER) DEVI 1 each by Does not apply route daily. 12/15/17    Glean Hess, MD  calcium-vitamin D (OSCAL WITH D) 500-200 MG-UNIT TABS tablet Take 1 tablet by mouth daily.    [provider]  hydrochlorothiazide (HYDRODIURIL) 25 MG tablet Take 25 mg by mouth daily.     [provider]  irbesartan (AVAPRO) 300 MG tablet TAKE ONE TABLET BY MOUTH ONCE DAILY 10/09/20   Glean Hess, MD  metFORMIN (GLUCOPHAGE-XR) 500 MG 24 hr tablet TAKE ONE TABLET BY MOUTH EVERY MORNING WITH BREAKFAST Patient taking differently: Take 500 mg by mouth daily with breakfast. 07/10/20   Glean Hess, MD    Allergies Patient has no known allergies.  Family History  Problem Relation Age of Onset  . Cancer Mother        Pancreatic  . Cancer Father        Throat  . Healthy Sister   . Cancer Sister        liver  . HIV Brother   . Cancer Maternal Grandmother        Breast  . Breast cancer Maternal Grandmother 75    Social History Social History   Tobacco Use  . Smoking status: Current Every Day Smoker    Packs/day: 0.50    Years: 44.00    Pack years: 22.00    Types: Cigarettes  . Smokeless tobacco: Never Used  Vaping Use  . Vaping Use: Never used  Substance Use Topics  . Alcohol use: Yes    Alcohol/week: 2.0 standard drinks    Types: 2 Cans of beer per week    Comment: weekends - beer   . Drug use: No    Review of Systems Constitutional: No fever/chills Eyes: No visual changes. ENT: No sore throat. Cardiovascular: Denies chest pain. Respiratory: Denies shortness of breath. Gastrointestinal: No abdominal pain.  No nausea, no vomiting.  No diarrhea.  No constipation. Genitourinary: Negative for dysuria. Musculoskeletal: Negative for back pain. Skin: Negative for rash. Neurological: Negative for headaches, focal weakness or numbness. Endocrine:  Diabetes, gout, hyperlipidemia, hypertension  ____________________________________________   PHYSICAL EXAM:  VITAL SIGNS: ED Triage Vitals  Enc Vitals Group     BP 10/19/20  0849 (!) 150/74     Pulse Rate 10/19/20 0849 (!) 102     Resp 10/19/20 0849 18     Temp 10/19/20 0849 98.4 F (36.9 C)     Temp Source 10/19/20 0849 Oral     SpO2 10/19/20 0849 100 %     Weight 10/19/20 0849 201 lb (91.2 kg)     Height 10/19/20 0849 5\' 8"  (1.727 m)     Head Circumference --      Peak Flow --      Pain Score 10/19/20 0848 7     Pain Loc --      Pain Edu? --  Excl. in [place]? --    Constitutional: Alert and oriented. Well appearing and in no acute distress. Cardiovascular: Tachycardic, regular rhythm. Grossly normal heart sounds.  Good peripheral circulation to palpation.  Peripheral edema. Respiratory: Normal respiratory effort.  No retractions. Lungs CTAB. Musculoskeletal: Left lower extremity tenderness and edema.  No joint effusions. Neurologic:  Normal speech and language. No gross focal neurologic deficits are appreciated. No gait instability. Skin:  Skin is warm, dry and intact. No rash noted. Psychiatric: Mood and affect are normal. Speech and behavior are normal.  ____________________________________________   LABS (all labs ordered are listed, but only abnormal results are displayed)  Labs Reviewed - No data to display ____________________________________________  EKG   ____________________________________________  RADIOLOGY I, Sable Feil, personally viewed and evaluated these images (plain radiographs) as part of my medical decision making, as well as reviewing the written report by the radiologist.  ED MD interpretation: No acute findings on x-ray of the left foot.  Official radiology report(s): US Venous Img Lower Unilateral Left  Result Date: 10/19/2020 CLINICAL DATA:  Left lower leg pain and edema EXAM: LEFT LOWER EXTREMITY VENOUS DOPPLER ULTRASOUND TECHNIQUE: Gray-scale sonography with compression, as well as color and duplex ultrasound, were performed to evaluate the deep venous system(s) from the level of the common femoral vein  through the popliteal and proximal calf veins. COMPARISON:  None. FINDINGS: VENOUS Normal compressibility of the common femoral, superficial femoral, and popliteal veins, as well as the visualized calf veins. Visualized portions of profunda femoral vein and great saphenous vein unremarkable. No filling defects to suggest DVT on grayscale or color Doppler imaging. Doppler waveforms show normal direction of venous flow, normal respiratory plasticity and response to augmentation. Limited views of the contralateral common femoral vein are unremarkable. OTHER None. Limitations: none IMPRESSION: No lower extremity DVT Electronically Signed   By: Miachel Roux M.D.   On: 10/19/2020 11:26   DG Foot Complete Left  Result Date: 10/19/2020 CLINICAL DATA:  Struck left foot on a table 2 weeks ago, pain and swelling at third metatarsal area EXAM: LEFT FOOT - COMPLETE 3+ VIEW COMPARISON:  None. FINDINGS: There is no evidence of acute fracture. Osteopenia. No osseous erosion/destruction. There is mild midfoot degenerative arthritis. IMPRESSION: No evidence of acute fracture.  Mild midfoot osteoarthritis. Electronically Signed   By: Maurine Simmering   On: 10/19/2020 09:44    ____________________________________________   PROCEDURES  Procedure(s) performed (including Critical Care):  Procedures   ____________________________________________   INITIAL IMPRESSION / ASSESSMENT AND PLAN / ED COURSE  As part of my medical decision making, I reviewed the following data within the [place]         Patient presents with left foot pain secondary to stepping incident few days ago.  Patient also voices concern of left lower extremity edema.  Patient currently takes HCTZ to relieve fluid secondary to her peripheral edema.  Discussed no acute findings on x-ray and ultrasound of the left lower extremity.  Patient given discharge care instruction advised continue previous medications.  Follow-up with PCP.       ____________________________________________   FINAL CLINICAL IMPRESSION(S) / ED DIAGNOSES  Final diagnoses:  Pain  Foot pain, left  Peripheral edema     ED Discharge Orders         Ordered    traMADol (ULTRAM) 50 MG tablet  Every 6 hours PRN        10/19/20 1151          *  Please note:  Donna Riley was evaluated in Emergency Department on 10/19/2020 for the symptoms described in the history of present illness. She was evaluated in the context of the global COVID-19 pandemic, which necessitated consideration that the patient might be at risk for infection with the SARS-CoV-2 virus that causes COVID-19. Institutional protocols and algorithms that pertain to the evaluation of patients at risk for COVID-19 are in a state of rapid change based on information released by regulatory bodies including the CDC and federal and state organizations. These policies and algorithms were followed during the patient's care in the ED.  Some ED evaluations and interventions may be delayed as a result of limited staffing during and the pandemic.*   Note:  This document was prepared using Dragon voice recognition software and may include unintentional dictation errors.    Sable Feil, PA-C 10/19/20 1155    Delman Kitten, MD 10/19/20 [phone]

## 2020-10-19 NOTE — Discharge Instructions (Signed)
No acute findings on x-ray ultrasound of the left lower extremity.  Continue previous medications for your peripheral edema.

## 2020-10-19 NOTE — ED Triage Notes (Signed)
Pt comes with c/o left foot pain. Pt states she injured it few days ago. Pt states it is painful and swelling.

## 2020-10-21 DIAGNOSIS — E119 Type 2 diabetes mellitus without complications: Secondary | ICD-10-CM

## 2020-10-21 NOTE — Congregational Nurse Program (Signed)
  Dept: [phone]   Congregational Nurse Program Note  Date of Encounter: 10/21/2020  Past Medical History: Past Medical History:  Diagnosis Date  . Arthritis    shoulders and neck  . Breast cancer ([place]) 2016    Left breast with chemo + rad tx's with lumpectomy.  . Breast wound, left, sequela 06/23/2016  . 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 COPD (chronic obstructive pulmonary disease) ([place])   . Diabetes mellitus without complication ([place]) 07/2669  . Elevated LFTs   . Fatigue 08/01/2016  . GERD (gastroesophageal reflux disease)   . Gout   . Hydradenitis 01/23/2015  . Hyperlipidemia   . Hypertension   . Mastitis 10/15/2016  . Personal history of chemotherapy 2016   left breast ca  . Personal history of radiation therapy 2016   LEFT lumpectomy  . Personal history of radiation therapy   . Positive PPD, treated   . Shortness of breath dyspnea    with exertion    Encounter Details:  CNP Questionnaire - 10/21/20 2349      Questionnaire   Do you give verbal consent to treat you today? Yes    Visit Setting Church or Counselling psychologist Patient Served At [employer]    Patient Status Not [employer];Medicare    Intervention Educate;Support    Food Have food insecurities    Referrals PCP - [place]           pt into nurse only clinic during food bank pick up requesting screening of blood sugar as she feels shaky). No other symptoms. Reinforced proper eating for diabetes.  Doesn't have testing supplies. Referred back to PCP to obtain and start using.  RHermann, RN

## 2020-10-22 ENCOUNTER — Telehealth: Payer: Self-pay | Admitting: Oncology

## 2020-10-22 LAB — GLUCOSE, POCT (MANUAL RESULT ENTRY): POC Glucose: 165 mg/dl — AB (ref 70–99)

## 2020-10-22 NOTE — Telephone Encounter (Signed)
Re: Follow-up  Wanted to make sure that patient has appropriate follow-up as indicated by Dr. Rush Landmark.   A. RECTUM, PROXIMAL, EMR:  - Well differentiated neuroendocrine tumor (carcinoid).  - Neuroendocrine tumor less than 0.1 cm from cauterized margin.    I have been obtaining a CT abdomen/pelvis as well as a chromogranin A level at baseline or right after the resection to see what those levels are so that we can follow those in the future and only get a dotatate scan if we have persistently elevated chromogranin a levels or something else is found on the imaging study.  She had a CT scan completed on 09/30/20 showing NED.   Needs chromogranin A levels drawn.   Faythe Casa, NP 10/22/2020 1:19 PM

## 2020-10-29 DIAGNOSIS — I70219 Atherosclerosis of native arteries of extremities with intermittent claudication, unspecified extremity: Secondary | ICD-10-CM | POA: Diagnosis not present

## 2020-10-29 DIAGNOSIS — I5189 Other ill-defined heart diseases: Secondary | ICD-10-CM

## 2020-10-29 DIAGNOSIS — I38 Endocarditis, valve unspecified: Secondary | ICD-10-CM

## 2020-10-29 DIAGNOSIS — I6523 Occlusion and stenosis of bilateral carotid arteries: Secondary | ICD-10-CM | POA: Diagnosis not present

## 2020-10-29 DIAGNOSIS — I25118 Atherosclerotic heart disease of native coronary artery with other forms of angina pectoris: Secondary | ICD-10-CM | POA: Diagnosis not present

## 2020-10-29 HISTORY — DX: Other ill-defined heart diseases: I51.89

## 2020-10-29 HISTORY — DX: Endocarditis, valve unspecified: I38

## 2020-11-03 ENCOUNTER — Ambulatory Visit (INDEPENDENT_AMBULATORY_CARE_PROVIDER_SITE_OTHER): Payer: Medicare Other | Admitting: Internal Medicine

## 2020-11-03 ENCOUNTER — Other Ambulatory Visit: Payer: Self-pay

## 2020-11-03 ENCOUNTER — Encounter: Payer: Self-pay | Admitting: Internal Medicine

## 2020-11-03 VITALS — BP 112/58 | HR 84 | Temp 98.4°F | Ht 68.0 in | Wt 199.0 lb

## 2020-11-03 DIAGNOSIS — I10 Essential (primary) hypertension: Secondary | ICD-10-CM | POA: Diagnosis not present

## 2020-11-03 DIAGNOSIS — E785 Hyperlipidemia, unspecified: Secondary | ICD-10-CM

## 2020-11-03 DIAGNOSIS — E118 Type 2 diabetes mellitus with unspecified complications: Secondary | ICD-10-CM

## 2020-11-03 DIAGNOSIS — E1169 Type 2 diabetes mellitus with other specified complication: Secondary | ICD-10-CM | POA: Diagnosis not present

## 2020-11-03 LAB — POCT GLYCOSYLATED HEMOGLOBIN (HGB A1C)
Est. average glucose Bld gHb Est-mCnc: 126
Hemoglobin A1C: 6 % — AB (ref 4.0–5.6)

## 2020-11-03 MED ORDER — HYDROCHLOROTHIAZIDE 25 MG PO TABS: 25.0000 mg | ORAL_TABLET | Freq: Every day | ORAL | 1 refills | Status: DC

## 2020-11-03 MED ORDER — METFORMIN HCL ER 500 MG PO TB24
ORAL_TABLET | ORAL | 1 refills | Status: DC
Start: 2020-11-03 — End: 2021-11-03

## 2020-11-03 MED ORDER — AMLODIPINE BESYLATE 10 MG PO TABS
2.0000 | ORAL_TABLET | Freq: Every day | ORAL | 2 refills | Status: DC
Start: 2020-11-03 — End: 2021-01-06

## 2020-11-03 NOTE — Progress Notes (Signed)
Date:  11/03/2020   Name:  Donna Riley   DOB:  07-16-1960   MRN:  [id number]   Chief Complaint: Hypertension and Diabetes (Has eye exam this week.)  Diabetes She presents for her follow-up diabetic visit. She has type 2 diabetes mellitus. Pertinent negatives for hypoglycemia include no dizziness or headaches. Pertinent negatives for diabetes include no chest pain, no fatigue and no weakness. Current diabetic treatment includes oral agent (monotherapy). An ACE inhibitor/angiotensin II receptor blocker is being taken.  Hypertension This is a chronic problem. Pertinent negatives include no chest pain, headaches, palpitations or shortness of breath. Past treatments include calcium channel blockers, angiotensin blockers and diuretics.    Lab Results  Component Value Date   CREATININE 0.90 09/30/2020   BUN 17 08/10/2020   NA 134 (L) 08/10/2020   K 3.7 08/10/2020   CL 101 08/10/2020   CO2 21 (L) 08/10/2020   Lab Results  Component Value Date   CHOL 159 07/24/2020   HDL 37 (L) 07/24/2020   LDLCALC 62 07/24/2020   TRIG 301 (H) 07/24/2020   CHOLHDL 4.3 07/24/2020   Lab Results  Component Value Date   TSH 1.765 04/11/2018   Lab Results  Component Value Date   HGBA1C 6.0 (A) 11/03/2020   Lab Results  Component Value Date   WBC 8.3 08/10/2020   HGB 13.5 08/10/2020   HCT 41.3 08/10/2020   MCV 85.0 08/10/2020   PLT 307 08/10/2020   Lab Results  Component Value Date   ALT 21 08/10/2020   AST 25 08/10/2020   ALKPHOS 79 08/10/2020   BILITOT 0.5 08/10/2020     Review of Systems  Constitutional: Negative for fatigue and unexpected weight change.  HENT: Negative for nosebleeds.   Eyes: Negative for visual disturbance.  Respiratory: Negative for cough, chest tightness, shortness of breath and wheezing.   Cardiovascular: Negative for chest pain, palpitations and leg swelling (in left leg only).  Gastrointestinal: Negative for abdominal pain, constipation and diarrhea.   Musculoskeletal: Negative for arthralgias and gait problem.  Neurological: Negative for dizziness, weakness, light-headedness and headaches.  Psychiatric/Behavioral: Negative for sleep disturbance.    Patient Active Problem List   Diagnosis Date Noted  . Smoking history 08/29/2020  . Benign neuroendocrine tumor of sigmoid colon   . History of rectal polyps   . Nodule of colon   . Polyp of ascending colon   . Polyp of descending colon   . Polyp of sigmoid colon   . Sebaceous cyst 08/11/2020  . Osteopenia of lumbar spine 07/19/2020  . Breast pain, right 07/03/2020  . Screening for condition 06/10/2020  . Atherosclerosis of abdominal aorta ([place]) 02/06/2020  . Tobacco use disorder, continuous 01/14/2020  . Helicobacter pylori gastritis 06/03/2019  . Abdominal bloating   . Nausea   . 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 Lumbosacral spondylosis without myelopathy 11/22/2018  . Vertigo 10/09/2018  . Acute midline low back pain without sciatica 10/09/2018  . Hyperlipidemia associated with type 2 diabetes mellitus ([place]) 04/11/2018  . Gastroparesis 04/11/2018  . Long term (current) use of aromatase inhibitors 12/25/2017  . Arthritis   . Elevated LFTs   . Essential hypertension   . Positive PPD, treated   . Umbilical hernia without obstruction and without gangrene 07/13/2016  . Rectal polyp   . Irritable bowel syndrome with both constipation and diarrhea 07/20/2015  . Type II diabetes mellitus with complication ([place]) 50/01/3817  .  Pre-ulcerative calluses 06/22/2015  . Neuropathy 06/22/2015  . Hot flash, menopausal 04/09/2015  . Atherosclerotic peripheral vascular disease with intermittent claudication ([place]) 03/25/2015  . Osteoporosis of lumbar spine 03/12/2015  . Malignant neoplasm of left breast in female, estrogen receptor positive ([place]) 05/30/2014  . 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 BREAST BIOPSY Left 07/31/2014   [facility] and DCIS   . BREAST BIOPSY Left 03/08/2016   INTRADUCTAL PAPILLOMA   . 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 BREAST LUMPECTOMY Left 04/05/2016   excision of intraductal papilloma, no malignancy or atypia  . BREAST LUMPECTOMY WITH NEEDLE LOCALIZATION Left 04/05/2016   Procedure: BREAST LUMPECTOMY WITH NEEDLE LOCALIZATION;  Surgeon: Hubbard Robinson, MD;  Location: [facility];  Service: General;  Laterality: Left;  . BREAST LUMPECTOMY WITH NEEDLE LOCALIZATION AND AXILLARY SENTINEL LYMPH NODE BX Left 08/27/14  . CHOLECYSTECTOMY  09/07/2013  . COLONOSCOPY WITH PROPOFOL N/A 08/28/2015   Procedure: COLONOSCOPY WITH PROPOFOL;  Surgeon: Lucilla Lame, MD;  Location: [place];  Service: Endoscopy;  Laterality: N/A;  Diabetic oral  . COLONOSCOPY WITH PROPOFOL N/A 08/14/2020   Procedure: COLONOSCOPY WITH PROPOFOL;  Surgeon: Virgel Manifold, MD;  Location: [facility];  Service: Endoscopy;  Laterality: N/A;  . ENDOSCOPIC MUCOSAL RESECTION N/A 09/10/2020   Procedure: ENDOSCOPIC MUCOSAL RESECTION;  Surgeon: Rush Landmark Telford Nab., MD;  Location: [place];  Service: Gastroenterology;  Laterality: N/A;  . ESOPHAGOGASTRODUODENOSCOPY  2014   gastritis  . ESOPHAGOGASTRODUODENOSCOPY (EGD) WITH PROPOFOL N/A 04/16/2019   Procedure: ESOPHAGOGASTRODUODENOSCOPY (EGD) WITH PROPOFOL;  Surgeon: Virgel Manifold, MD;  Location: [facility];  Service: Endoscopy;  Laterality: N/A;  . ESOPHAGOGASTRODUODENOSCOPY (EGD) WITH PROPOFOL N/A 10/03/2019   Procedure: ESOPHAGOGASTRODUODENOSCOPY (EGD) WITH PROPOFOL;  Surgeon: Virgel Manifold, MD;  Location: [facility];  Service: Endoscopy;  Laterality: N/A;  . EUS N/A 09/10/2020   Procedure: LOWER ENDOSCOPIC ULTRASOUND (EUS);  Surgeon: Irving Copas., MD;  Location: [place];  Service: Gastroenterology;   Laterality: N/A;  . FLEXIBLE SIGMOIDOSCOPY N/A 08/27/2020   Procedure: FLEXIBLE SIGMOIDOSCOPY;  Surgeon: Virgel Manifold, MD;  Location: [facility];  Service: Endoscopy;  Laterality: N/A;  . FLEXIBLE SIGMOIDOSCOPY N/A 09/10/2020   Procedure: FLEXIBLE SIGMOIDOSCOPY;  Surgeon: Rush Landmark Telford Nab., MD;  Location: [place];  Service: Gastroenterology;  Laterality: N/A;  . FOREIGN BODY REMOVAL  09/10/2020   Procedure: FOREIGN BODY REMOVAL ;  Surgeon: Rush Landmark Telford Nab., MD;  Location: [place];  Service: Gastroenterology;;  . HEMOSTASIS CLIP PLACEMENT  09/10/2020   Procedure: HEMOSTASIS CLIP PLACEMENT;  Surgeon: Irving Copas., MD;  Location: [place];  Service: Gastroenterology;;  . POLYPECTOMY  08/28/2015   Procedure: POLYPECTOMY INTESTINAL;  Surgeon: Lucilla Lame, MD;  Location: [place];  Service: Endoscopy;;  Rectal polyp  . SUBMUCOSAL LIFTING INJECTION  09/10/2020   Procedure: SUBMUCOSAL LIFTING INJECTION;  Surgeon: Rush Landmark Telford Nab., MD;  Location: [place];  Service: Gastroenterology;;  . TUBAL LIGATION Bilateral   . UMBILICAL HERNIA REPAIR N/A 08/09/2016   Procedure: HERNIA REPAIR UMBILICAL ADULT;  Surgeon: Olean Ree, MD;  Location: [facility];  Service: General;  Laterality: N/A;  . VENTRAL HERNIA REPAIR N/A 04/10/2015   Procedure: HERNIA REPAIR VENTRAL ADULT;  Surgeon: Leonie Green, MD;  Location: [facility];  Service: General;  Laterality: N/A;    Social History   Tobacco Use  . Smoking status: Current  Every Day Smoker    Packs/day: 0.50    Years: 44.00    Pack years: 22.00    Types: Cigarettes  . Smokeless tobacco: Never Used  Vaping Use  . Vaping Use: Never used  Substance Use Topics  . Alcohol use: Yes    Alcohol/week: 2.0 standard drinks    Types: 2 Cans of beer per week    Comment: weekends - beer   . Drug use: No     Medication list has been reviewed and updated.  Current Meds  Medication Sig  .  acetaminophen (TYLENOL) 325 MG tablet Take 650 mg by mouth daily as needed for moderate pain.  . Alcohol Swabs (B-D SINGLE USE SWABS REGULAR) PADS 1 each by Does not apply route 2 (two) times daily.  Marland Kitchen amLODipine (NORVASC) 10 MG tablet TAKE TWO TABLETS BY MOUTH ONCE DAILY (Patient taking differently: Take 20 mg by mouth daily.)  . aspirin EC 81 MG tablet Take 81 mg by mouth daily.  Marland Kitchen atorvastatin (LIPITOR) 10 MG tablet Take 1 tablet (10 mg total) by mouth daily.  . Blood Glucose Calibration (TRUE METRIX LEVEL 1) Low SOLN 1 each by In Vitro route daily as needed.  . Blood Glucose Monitoring Suppl (TRUE METRIX METER) DEVI 1 each by Does not apply route daily.  . calcium-vitamin D (OSCAL WITH D) 500-200 MG-UNIT TABS tablet Take 1 tablet by mouth daily.  . hydrochlorothiazide (HYDRODIURIL) 25 MG tablet Take 25 mg by mouth daily.   . irbesartan (AVAPRO) 300 MG tablet TAKE ONE TABLET BY MOUTH ONCE DAILY  . metFORMIN (GLUCOPHAGE-XR) 500 MG 24 hr tablet TAKE ONE TABLET BY MOUTH EVERY MORNING WITH BREAKFAST (Patient taking differently: Take 500 mg by mouth daily with breakfast.)  . [DISCONTINUED] traMADol (ULTRAM) 50 MG tablet Take 1 tablet (50 mg total) by mouth every 6 (six) hours as needed for moderate pain.    PHQ 2/9 Scores 11/03/2020 10/12/2020 07/24/2020 07/13/2020  PHQ - 2 Score 0 0 0 0  PHQ- 9 Score 4 0 0 -    GAD 7 : Generalized Anxiety Score 11/03/2020 10/12/2020 07/24/2020 10/01/2019  Nervous, Anxious, on Edge 1 0 0 0  Control/stop worrying 0 0 0 0  Worry too much - different things 0 0 0 0  Trouble relaxing 1 0 0 0  Restless 0 0 0 0  Easily annoyed or irritable 0 0 0 0  Afraid - awful might happen 0 0 0 0  Total GAD 7 Score 2 0 0 0  Anxiety Difficulty Not difficult at all Not difficult at all - Not difficult at all    BP Readings from Last 3 Encounters:  11/03/20 (!) 112/58  10/19/20 (!) 150/74  10/12/20 138/62    Physical Exam Vitals and nursing note reviewed.  Constitutional:       General: She is not in acute distress.    Appearance: She is well-developed.  HENT:     Head: Normocephalic and atraumatic.  Cardiovascular:     Rate and Rhythm: Normal rate and regular rhythm.     Pulses: Normal pulses.     Heart sounds: No murmur heard.   Pulmonary:     Effort: Pulmonary effort is normal. No respiratory distress.     Breath sounds: No wheezing or rhonchi.  Musculoskeletal:     Cervical back: Normal range of motion.     Right lower leg: No edema.     Left lower leg: No edema.  Skin:  General: Skin is warm and dry.     Findings: No rash.  Neurological:     Mental Status: She is alert and oriented to person, place, and time.  Psychiatric:        Mood and Affect: Mood normal.        Behavior: Behavior normal.     Wt Readings from Last 3 Encounters:  11/03/20 199 lb (90.3 kg)  10/19/20 201 lb (91.2 kg)  10/12/20 201 lb (91.2 kg)    BP (!) 112/58   Pulse 84   Temp 98.4 F (36.9 C) (Oral)   Ht 5\' 8"  (1.727 m)   Wt 199 lb (90.3 kg)   SpO2 98%   BMI 30.26 kg/m   Assessment and Plan: 1. Type II diabetes mellitus with complication (HCC) Clinically stable by exam and report without s/s of hypoglycemia. DM complicated by HTN. Tolerating medications - metformin -  well without side effects or other concerns. - POCT glycosylated hemoglobin (Hb A1C) = 6.0 down from 6.2 - metFORMIN (GLUCOPHAGE-XR) 500 MG 24 hr tablet; TAKE ONE TABLET BY MOUTH EVERY MORNING WITH BREAKFAST  Dispense: 90 tablet; Refill: 1  2. Essential hypertension Clinically stable exam with well controlled BP. Tolerating medications without side effects at this time. Pt to continue current regimen and low sodium diet; benefits of regular exercise as able discussed. She is still having edema of the left foot/ankle - may be due to amlodipine; [place] negative. Has cardiology stress testing later this month. - hydrochlorothiazide (HYDRODIURIL) 25 MG tablet; Take 1 tablet (25 mg total) by mouth  daily.  Dispense: 90 tablet; Refill: 1 - amLODipine (NORVASC) 10 MG tablet; Take 2 tablets (20 mg total) by mouth daily.  Dispense: 60 tablet; Refill: 2  3. Hyperlipidemia associated with type 2 diabetes mellitus ([place]) Tolerating statin medication without side effects at this time LDL is at goal of < 70 on current dose Continue same therapy without change at this time.   Partially dictated using Editor, commissioning. Any errors are unintentional.  Halina Maidens, MD [facility]  11/03/2020

## 2020-11-06 DIAGNOSIS — E119 Type 2 diabetes mellitus without complications: Secondary | ICD-10-CM | POA: Diagnosis not present

## 2020-11-06 DIAGNOSIS — H35013 Changes in retinal vascular appearance, bilateral: Secondary | ICD-10-CM | POA: Diagnosis not present

## 2020-11-06 DIAGNOSIS — H04123 Dry eye syndrome of bilateral lacrimal glands: Secondary | ICD-10-CM | POA: Diagnosis not present

## 2020-11-07 LAB — HM DIABETES EYE EXAM

## 2020-11-17 DIAGNOSIS — D2371 Other benign neoplasm of skin of right lower limb, including hip: Secondary | ICD-10-CM | POA: Diagnosis not present

## 2020-11-23 DIAGNOSIS — I25118 Atherosclerotic heart disease of native coronary artery with other forms of angina pectoris: Secondary | ICD-10-CM | POA: Diagnosis not present

## 2020-12-03 DIAGNOSIS — E118 Type 2 diabetes mellitus with unspecified complications: Secondary | ICD-10-CM | POA: Diagnosis not present

## 2020-12-03 DIAGNOSIS — I2510 Atherosclerotic heart disease of native coronary artery without angina pectoris: Secondary | ICD-10-CM | POA: Diagnosis not present

## 2020-12-03 DIAGNOSIS — E782 Mixed hyperlipidemia: Secondary | ICD-10-CM | POA: Diagnosis not present

## 2020-12-03 DIAGNOSIS — I10 Essential (primary) hypertension: Secondary | ICD-10-CM | POA: Diagnosis not present

## 2020-12-18 ENCOUNTER — Ambulatory Visit (INDEPENDENT_AMBULATORY_CARE_PROVIDER_SITE_OTHER): Payer: Medicare Other | Admitting: Internal Medicine

## 2020-12-18 ENCOUNTER — Encounter: Payer: Self-pay | Admitting: Internal Medicine

## 2020-12-18 ENCOUNTER — Other Ambulatory Visit: Payer: Self-pay

## 2020-12-18 VITALS — BP 110/68 | HR 82 | Temp 98.5°F | Ht 68.0 in | Wt 196.0 lb

## 2020-12-18 DIAGNOSIS — Z23 Encounter for immunization: Secondary | ICD-10-CM

## 2020-12-18 DIAGNOSIS — R21 Rash and other nonspecific skin eruption: Secondary | ICD-10-CM

## 2020-12-18 MED ORDER — SHINGRIX 50 MCG/0.5ML IM SUSR: 0.5000 mL | Freq: Once | INTRAMUSCULAR | 1 refills | Status: AC

## 2020-12-18 NOTE — Progress Notes (Signed)
Date:  12/18/2020   Name:  Donna Riley   DOB:  [birth year]   MRN:  [id number]   Chief Complaint: Rash (X1 week, left side, Red spot on stomach irritated, changed color from red to a dark color, was painful, itching some)  Rash This is a new problem. The current episode started in the past 7 days. The problem has been gradually improving since onset. The affected locations include the abdomen. The rash is characterized by itchiness and redness. It is unknown if there was an exposure to a precipitant. Pertinent negatives include no cough, fatigue, fever or shortness of breath. Past treatments include nothing.   Lab Results  Component Value Date   CREATININE 0.90 09/30/2020   BUN 17 08/10/2020   NA 134 (L) 08/10/2020   K 3.7 08/10/2020   CL 101 08/10/2020   CO2 21 (L) 08/10/2020   Lab Results  Component Value Date   CHOL 159 07/24/2020   HDL 37 (L) 07/24/2020   LDLCALC 62 07/24/2020   TRIG 301 (H) 07/24/2020   CHOLHDL 4.3 07/24/2020   Lab Results  Component Value Date   TSH 1.765 04/11/2018   Lab Results  Component Value Date   HGBA1C 6.0 (A) 11/03/2020   Lab Results  Component Value Date   WBC 8.3 08/10/2020   HGB 13.5 08/10/2020   HCT 41.3 08/10/2020   MCV 85.0 08/10/2020   PLT 307 08/10/2020   Lab Results  Component Value Date   ALT 21 08/10/2020   AST 25 08/10/2020   ALKPHOS 79 08/10/2020   BILITOT 0.5 08/10/2020     Review of Systems  Constitutional:  Negative for fatigue and fever.  Respiratory:  Negative for cough and shortness of breath.   Skin:  Positive for rash.   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/2019   Lumbosacral spondylosis without myelopathy 11/22/2018   Vertigo 10/09/2018   Acute midline low back pain without sciatica 10/09/2018   Hyperlipidemia associated with type 2 diabetes mellitus ([place]) 04/11/2018   Gastroparesis 04/11/2018   Long term (current) use of aromatase inhibitors 12/25/2017   Arthritis    Elevated LFTs    Essential hypertension    Positive PPD, treated    Umbilical hernia without obstruction and without gangrene 07/13/2016   Rectal polyp    Irritable bowel syndrome with both constipation and diarrhea 07/20/2015   Type II diabetes mellitus with complication ([place]) [id number]   Pre-ulcerative calluses 06/22/2015   Neuropathy 06/22/2015   Hot flash, menopausal 04/09/2015   Atherosclerotic peripheral vascular disease with intermittent claudication ([place]) 03/25/2015   Osteoporosis of lumbar spine 03/12/2015   Malignant neoplasm of left breast in female, estrogen receptor positive ([place]) 05/30/2014   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    No Known Allergies  Past Surgical History:  Procedure Laterality Date   BREAST BIOPSY Left 07/31/2014   [facility] and DCIS    BREAST BIOPSY Left 03/08/2016   INTRADUCTAL PAPILLOMA    BREAST LUMPECTOMY Left 07/2014   [facility], [facility]IS, clear margins, positive LN   BREAST LUMPECTOMY Left 04/05/2016   excision of intraductal papilloma, no malignancy or atypia   BREAST LUMPECTOMY WITH NEEDLE LOCALIZATION Left 04/05/2016   Procedure: BREAST LUMPECTOMY WITH NEEDLE LOCALIZATION;  Surgeon: Hubbard Robinson, MD;  Location: [facility];  Service: General;  Laterality: Left;   BREAST LUMPECTOMY WITH NEEDLE LOCALIZATION AND AXILLARY SENTINEL LYMPH NODE BX  Left 08/27/14   CHOLECYSTECTOMY  09/07/2013   COLONOSCOPY WITH PROPOFOL N/A 08/28/2015   Procedure: COLONOSCOPY WITH PROPOFOL;  Surgeon: Lucilla Lame, MD;  Location: [place];  Service: Endoscopy;  Laterality: N/A;  Diabetic oral   COLONOSCOPY WITH PROPOFOL N/A 08/14/2020   Procedure: COLONOSCOPY WITH PROPOFOL;  Surgeon: Virgel Manifold, MD;  Location: [facility];  Service: Endoscopy;  Laterality: N/A;   ENDOSCOPIC MUCOSAL RESECTION N/A 09/10/2020   Procedure: ENDOSCOPIC MUCOSAL RESECTION;  Surgeon: Rush Landmark Telford Nab., MD;  Location: [place];  Service: Gastroenterology;  Laterality: N/A;   ESOPHAGOGASTRODUODENOSCOPY  2014   gastritis   ESOPHAGOGASTRODUODENOSCOPY (EGD) WITH PROPOFOL N/A 04/16/2019   Procedure: ESOPHAGOGASTRODUODENOSCOPY (EGD) WITH PROPOFOL;  Surgeon: Virgel Manifold, MD;  Location: [facility];  Service: Endoscopy;  Laterality: N/A;   ESOPHAGOGASTRODUODENOSCOPY (EGD) WITH PROPOFOL N/A 10/03/2019   Procedure: ESOPHAGOGASTRODUODENOSCOPY (EGD) WITH PROPOFOL;  Surgeon: Virgel Manifold, MD;  Location: [facility];  Service: Endoscopy;  Laterality: N/A;   EUS N/A 09/10/2020   Procedure: LOWER ENDOSCOPIC ULTRASOUND (EUS);  Surgeon: Irving Copas., MD;  Location: [place];  Service: Gastroenterology;  Laterality: N/A;   FLEXIBLE SIGMOIDOSCOPY N/A 08/27/2020   Procedure: FLEXIBLE SIGMOIDOSCOPY;  Surgeon: Virgel Manifold, MD;  Location: [facility];  Service: Endoscopy;  Laterality: N/A;   FLEXIBLE SIGMOIDOSCOPY N/A 09/10/2020   Procedure: FLEXIBLE SIGMOIDOSCOPY;  Surgeon: Rush Landmark Telford Nab., MD;  Location: [place];  Service: Gastroenterology;  Laterality: N/A;   FOREIGN BODY REMOVAL  09/10/2020   Procedure: FOREIGN BODY REMOVAL ;  Surgeon: Rush Landmark Telford Nab., MD;  Location: [place];  Service: Gastroenterology;;   HEMOSTASIS CLIP PLACEMENT  09/10/2020   Procedure: HEMOSTASIS CLIP PLACEMENT;  Surgeon: Irving Copas., MD;  Location: [place];  Service: Gastroenterology;;   POLYPECTOMY  08/28/2015   Procedure: POLYPECTOMY INTESTINAL;  Surgeon: Lucilla Lame, MD;  Location: [place];  Service: Endoscopy;;  Rectal polyp   SUBMUCOSAL LIFTING INJECTION  09/10/2020   Procedure: SUBMUCOSAL LIFTING INJECTION;  Surgeon: Irving Copas., MD;  Location: [place];  Service: Gastroenterology;;   TUBAL LIGATION Bilateral    UMBILICAL HERNIA REPAIR N/A 08/09/2016   Procedure: HERNIA REPAIR UMBILICAL ADULT;  Surgeon: Olean Ree, MD;  Location: [facility];  Service: General;  Laterality: N/A;   VENTRAL HERNIA REPAIR N/A 04/10/2015   Procedure: HERNIA REPAIR VENTRAL ADULT;  Surgeon: Leonie Green, MD;  Location: [facility];  Service: General;  Laterality: N/A;    Social History   Tobacco Use   Smoking status: Every Day    Packs/day: 0.50    Years: 44.00    Pack years: 22.00    Types: Cigarettes   Smokeless tobacco: Never  Vaping Use   Vaping Use: Never used  Substance Use Topics   Alcohol use: Yes    Alcohol/week: 2.0 standard drinks    Types: 2 Cans of  beer per week    Comment: weekends - beer    Drug use: No     Medication list has been reviewed and updated.  Current Meds  Medication Sig   acetaminophen (TYLENOL) 325 MG tablet Take 650 mg by mouth daily as needed for moderate pain.   Alcohol Swabs (B-D SINGLE USE SWABS REGULAR) PADS 1 each by Does not apply route 2 (two) times daily.   amLODipine (NORVASC) 10 MG tablet Take 2 tablets (20 mg total) by mouth daily.   aspirin EC 81 MG tablet Take 81 mg by mouth daily.   atorvastatin (LIPITOR) 20 MG tablet Take by mouth.   Blood Glucose Calibration (TRUE METRIX LEVEL 1) Low SOLN 1 each by In Vitro route daily as needed.   Blood Glucose Monitoring Suppl (TRUE METRIX METER) DEVI 1 each by Does not apply route daily.   calcium-vitamin D (OSCAL WITH D) 500-200 MG-UNIT TABS tablet Take 1 tablet by mouth daily.   glucose  blood (PRECISION QID TEST) test strip    hydrochlorothiazide (HYDRODIURIL) 25 MG tablet Take 1 tablet (25 mg total) by mouth daily.   irbesartan (AVAPRO) 300 MG tablet TAKE ONE TABLET BY MOUTH ONCE DAILY   metFORMIN (GLUCOPHAGE-XR) 500 MG 24 hr tablet TAKE ONE TABLET BY MOUTH EVERY MORNING WITH BREAKFAST    PHQ 2/9 Scores 11/03/2020 10/12/2020 07/24/2020 07/13/2020  PHQ - 2 Score 0 0 0 0  PHQ- 9 Score 4 0 0 -    GAD 7 : Generalized Anxiety Score 11/03/2020 10/12/2020 07/24/2020 10/01/2019  Nervous, Anxious, on Edge 1 0 0 0  Control/stop worrying 0 0 0 0  Worry too much - different things 0 0 0 0  Trouble relaxing 1 0 0 0  Restless 0 0 0 0  Easily annoyed or irritable 0 0 0 0  Afraid - awful might happen 0 0 0 0  Total GAD 7 Score 2 0 0 0  Anxiety Difficulty Not difficult at all Not difficult at all - Not difficult at all    BP Readings from Last 3 Encounters:  12/18/20 110/68  11/03/20 (!) 112/58  10/19/20 (!) 150/74    Physical Exam Vitals and nursing note reviewed.  Constitutional:      General: She is not in acute distress.    Appearance: She is well-developed.  HENT:     Head: Normocephalic and atraumatic.  Pulmonary:     Effort: Pulmonary effort is normal. No respiratory distress.  Skin:    General: Skin is warm and dry.     Findings: No rash.          Comments: 1 Cm red macule with central tiny opening c/w insect bite.  No other skin lesions observed or reported by patient.  Neurological:     Mental Status: She is alert and oriented to person, place, and time.  Psychiatric:        Mood and Affect: Mood normal.        Behavior: Behavior normal.    Wt Readings from Last 3 Encounters:  12/18/20 196 lb (88.9 kg)  11/03/20 199 lb (90.3 kg)  10/19/20 201 lb (91.2 kg)    BP 110/68   Pulse 82   Temp 98.5 F (36.9 C) (Oral)   Ht '5\' 8"'$  (1.727 m)   Wt 196 lb (88.9 kg)   SpO2 99%   BMI 29.80 kg/m   Assessment and Plan: 1. Rash Does not appear to be shingles but  rather a benign  insect bite Local care only needed  2. Need for shingles vaccine At risk for Zoster due to hx of Varicella in childhood - Zoster Vaccine Adjuvanted [facility]) injection; Inject 0.5 mLs into the muscle once for 1 dose.  Dispense: 0.5 mL; Refill: 1   Partially dictated using Editor, commissioning. Any errors are unintentional.  Halina Maidens, MD [facility]  12/18/2020

## 2020-12-25 DIAGNOSIS — M2042 Other hammer toe(s) (acquired), left foot: Secondary | ICD-10-CM | POA: Diagnosis not present

## 2020-12-25 DIAGNOSIS — M778 Other enthesopathies, not elsewhere classified: Secondary | ICD-10-CM | POA: Diagnosis not present

## 2020-12-25 DIAGNOSIS — M2041 Other hammer toe(s) (acquired), right foot: Secondary | ICD-10-CM | POA: Diagnosis not present

## 2020-12-25 DIAGNOSIS — E119 Type 2 diabetes mellitus without complications: Secondary | ICD-10-CM | POA: Diagnosis not present

## 2020-12-28 ENCOUNTER — Other Ambulatory Visit: Payer: Medicare Other

## 2020-12-28 ENCOUNTER — Ambulatory Visit: Payer: Medicare Other | Admitting: Hematology and Oncology

## 2020-12-31 ENCOUNTER — Encounter: Payer: Self-pay | Admitting: Oncology

## 2020-12-31 ENCOUNTER — Inpatient Hospital Stay (HOSPITAL_BASED_OUTPATIENT_CLINIC_OR_DEPARTMENT_OTHER): Payer: Medicare Other | Admitting: Oncology

## 2020-12-31 ENCOUNTER — Inpatient Hospital Stay: Payer: Medicare Other | Attending: Oncology

## 2020-12-31 ENCOUNTER — Other Ambulatory Visit: Payer: Self-pay

## 2020-12-31 VITALS — BP 127/67 | HR 84 | Temp 98.4°F | Resp 16 | Wt 198.5 lb

## 2020-12-31 DIAGNOSIS — E119 Type 2 diabetes mellitus without complications: Secondary | ICD-10-CM | POA: Diagnosis not present

## 2020-12-31 DIAGNOSIS — Z7982 Long term (current) use of aspirin: Secondary | ICD-10-CM | POA: Insufficient documentation

## 2020-12-31 DIAGNOSIS — C7A8 Other malignant neuroendocrine tumors: Secondary | ICD-10-CM | POA: Diagnosis not present

## 2020-12-31 DIAGNOSIS — Z170 Estrogen receptor positive status [ER+]: Secondary | ICD-10-CM | POA: Diagnosis not present

## 2020-12-31 DIAGNOSIS — E785 Hyperlipidemia, unspecified: Secondary | ICD-10-CM | POA: Insufficient documentation

## 2020-12-31 DIAGNOSIS — M8580 Other specified disorders of bone density and structure, unspecified site: Secondary | ICD-10-CM | POA: Insufficient documentation

## 2020-12-31 DIAGNOSIS — R7989 Other specified abnormal findings of blood chemistry: Secondary | ICD-10-CM | POA: Diagnosis not present

## 2020-12-31 DIAGNOSIS — Z853 Personal history of malignant neoplasm of breast: Secondary | ICD-10-CM | POA: Insufficient documentation

## 2020-12-31 DIAGNOSIS — Z7984 Long term (current) use of oral hypoglycemic drugs: Secondary | ICD-10-CM | POA: Insufficient documentation

## 2020-12-31 DIAGNOSIS — I10 Essential (primary) hypertension: Secondary | ICD-10-CM | POA: Diagnosis not present

## 2020-12-31 DIAGNOSIS — F17210 Nicotine dependence, cigarettes, uncomplicated: Secondary | ICD-10-CM | POA: Diagnosis not present

## 2020-12-31 DIAGNOSIS — J449 Chronic obstructive pulmonary disease, unspecified: Secondary | ICD-10-CM | POA: Insufficient documentation

## 2020-12-31 DIAGNOSIS — C50912 Malignant neoplasm of unspecified site of left female breast: Secondary | ICD-10-CM

## 2020-12-31 DIAGNOSIS — Z87891 Personal history of nicotine dependence: Secondary | ICD-10-CM | POA: Diagnosis not present

## 2020-12-31 DIAGNOSIS — D3A8 Other benign neuroendocrine tumors: Secondary | ICD-10-CM

## 2020-12-31 DIAGNOSIS — Z79899 Other long term (current) drug therapy: Secondary | ICD-10-CM | POA: Insufficient documentation

## 2020-12-31 DIAGNOSIS — K219 Gastro-esophageal reflux disease without esophagitis: Secondary | ICD-10-CM | POA: Diagnosis not present

## 2020-12-31 LAB — CBC WITH DIFFERENTIAL/PLATELET
Abs Immature Granulocytes: 0.02 10*3/uL (ref 0.00–0.07)
Basophils Absolute: 0.1 10*3/uL (ref 0.0–0.1)
Basophils Relative: 1 %
Eosinophils Absolute: 0.1 10*3/uL (ref 0.0–0.5)
Eosinophils Relative: 2 %
HCT: 39.5 % (ref 36.0–46.0)
Hemoglobin: 13.1 g/dL (ref 12.0–15.0)
Immature Granulocytes: 0 %
Lymphocytes Relative: 40 %
Lymphs Abs: 3.1 10*3/uL (ref 0.7–4.0)
MCH: 27.6 pg (ref 26.0–34.0)
MCHC: 33.2 g/dL (ref 30.0–36.0)
MCV: 83.2 fL (ref 80.0–100.0)
Monocytes Absolute: 0.4 10*3/uL (ref 0.1–1.0)
Monocytes Relative: 5 %
Neutro Abs: 4 10*3/uL (ref 1.7–7.7)
Neutrophils Relative %: 52 %
Platelets: 264 10*3/uL (ref 150–400)
RBC: 4.75 MIL/uL (ref 3.87–5.11)
RDW: 13.7 % (ref 11.5–15.5)
WBC: 7.6 10*3/uL (ref 4.0–10.5)
nRBC: 0 % (ref 0.0–0.2)

## 2020-12-31 LAB — COMPREHENSIVE METABOLIC PANEL
ALT: 21 U/L (ref 0–44)
AST: 20 U/L (ref 15–41)
Albumin: 4.1 g/dL (ref 3.5–5.0)
Alkaline Phosphatase: 85 U/L (ref 38–126)
Anion gap: 7 (ref 5–15)
BUN: 15 mg/dL (ref 6–20)
CO2: 25 mmol/L (ref 22–32)
Calcium: 8.9 mg/dL (ref 8.9–10.3)
Chloride: 101 mmol/L (ref 98–111)
Creatinine, Ser: 1.04 mg/dL — ABNORMAL HIGH (ref 0.44–1.00)
GFR, Estimated: >60 mL/min (ref >60)
Glucose, Bld: 124 mg/dL — ABNORMAL HIGH (ref 70–99)
Potassium: 3.6 mmol/L (ref 3.5–5.1)
Sodium: 133 mmol/L — ABNORMAL LOW (ref 135–145)
Total Bilirubin: 0.5 mg/dL (ref 0.3–1.2)
Total Protein: 7.7 g/dL (ref 6.5–8.1)

## 2020-12-31 NOTE — Progress Notes (Signed)
[facility]  [address], [address] [place], [place] [address] Phone: [phone]  Fax: [phone]   Clinic Day:  12/31/2020  Referring physician: Glean Hess, MD  Chief Complaint: Donna [facility] is a 60 y.o. female presents for follow-up of stage IIA left breast cancer and rectal neuroendocrine carcinoma.  PERTINENT ONCOLOGY HISTORY Patient previously followed up by Dr.Corcoran, patient switched care to me on 12/31/20 Extensive medical record review was performed by me  #stage IIA left breast cancer  08/27/2014 partial mastectomy and sentinel lymph node biopsy on 08/27/2014.  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  There was lymphovascular invasion.  One of 2 sentinel lymph nodes were positive  (focus of 2.8 mm).  Tumor was > 90% ER positive, > 90% PR positive, and Her2/neu negative.  Pathologic stage was [id number].   Bone scan on 09/16/2014 revealed abnormal focal uptake at the level of right L3 pedicle and L5 vertebral body.  Lumbar spine MRI on 09/27/2014 revealed no evidence of metastatic disease with lower lumbar facet arthritis.  Abdominal and pelvic CT scan on 09/24/2014 revealed hepatomegaly and no evidence of metastatic disease.     Adjuvant chemotherapy  4 cycles of Taxotere and Cytoxan (09/29/2014 - 12/09/2014) with Neulasta support. She received 50.4 Gy to the left breast from 01/12/2015 until 02/23/2015.    03/12/2015, started on letrozole but switched to Arimidex on 03/30/2015 secondary to diffuse joint aches.   Arimidex completed around 03/13/2020.  BCI testing on 08/20/2019 revealed an estimated risk of late recurrence between years 5-10 of 10.1% (95% CI: 5.6-14.3%).  She is not likely to benefit from extended endocrine therapy.   She had chronic left nipple discharge.  Left breast lumpectomy at the 11 o'clock position on 04/05/2016 revealed an intraductal papilloma  with sclerosis and calcifications. There was fat necrosis with fibrosis and calcifications. Pathology was negative for atypia and malignancy  Health maintenance Colonoscopy on 08/28/2015 revealed one 4 mm polyp in the rectum (tubular adenoma). EGD on 10/03/2019 revealed erythematous mucosa in the antrum. Pathology revealed antral and oxyntic mucosa with moderate chronic inactive gastritis. H. Pylori was negative. There was no intestinal metaplasia, dysplasia, and malignancy.   #Osteoporosis Bone density study on 09/15/2014 revealed osteopenia with a T-score of -2.1 at L1-L4.  Bone density on 01/17/2018 revealed osteoporosis with a T-score of -2.6 in the AP spine L1-L4.  Bone density on 03/03/2020 revealed osteopenia with a T score of -2.2 at the lumbar spine. She is on calcium and vitamin D.   #Lung cancer screening  She has a smoking history.  Low dose chest CT on 02/05/2020 revealed Lung-RADS 2S, benign appearance or behavior.  There was aortic atherosclerosis, in addition to left anterior descending coronary artery disease. There was mild diffuse bronchial wall thickening with mild centrilobular and paraseptal emphysema suggestive of underlying COPD.   INTERVAL HISTORY Donna [facility] is a 60 y.o. female who has above history reviewed by me today presents for follow up for history of breast cancer as well as rectal neuroendocrine carcinoma.  08/09/2020 colonoscopy by Dr. Bonna Gains showed 5 polyp in ascending colon, descending colon and sigmoid colon resected and retrieved-pathology showed tubular adenoma, multiple fragments.  Negative for high-grade dysplasia and malignancy. Nodule in the sigmoid colon, biopsied.  Clip was placed.-Biopsy showed well-differentiated neuroendocrine tumor.  Grade 1.  08/27/2020, flexible sigmoidoscopy showed mucosal nodule in the sigmoid colon, tattooed.  Nonbleeding internal  hemorrhoids.    09/10/2020  Flex Sigmoidoscopy/Lower EUS by Dr. Rush Landmark, and EMR   Pathology showed proximal rectum EMR Well differentiated neuroendocrine tumor-carcinoid.  Neuroendocrine tumor less 10.1 cm from cauterized margin. 1.2 x 1 x 0.8 cm.  Dr. Rush Landmark felt to be a complete resection and recommend 1 year follow-up lower EUS. 09/30/2020 CT abdomen pelvis showed sutures near the rectosigmoid junction consistent with history.  Hepatic steatosis.  09/30/2020 Chromogranin A level at 49.6.  Today patient reports no new complaints.  Feeling well. Past Medical History:  Diagnosis Date   Arthritis    shoulders and neck   Breast cancer ([place]) 2016    Left breast with chemo + rad tx's with lumpectomy.   Breast wound, left, sequela 06/23/2016   Cancer of gastrointestinal tract ([place]) 08/2020   COPD (chronic obstructive pulmonary disease) ([place])    Diabetes mellitus without complication ([place]) 09/537   Elevated LFTs    Fatigue 08/01/2016   GERD (gastroesophageal reflux disease)    Gout    Hydradenitis 01/23/2015   Hyperlipidemia    Hypertension    Mastitis 10/15/2016   Personal history of chemotherapy 2016   left breast ca   Personal history of radiation therapy 2016   LEFT lumpectomy   Personal history of radiation therapy    Positive PPD, treated    Shortness of breath dyspnea    with exertion    Past Surgical History:  Procedure Laterality Date   BREAST BIOPSY Left 07/31/2014   [facility] and DCIS    BREAST BIOPSY Left 03/08/2016   INTRADUCTAL PAPILLOMA    BREAST LUMPECTOMY Left 07/2014   [facility], DCIS, clear margins, positive LN   BREAST LUMPECTOMY Left 04/05/2016   excision of intraductal papilloma, no malignancy or atypia   BREAST LUMPECTOMY WITH NEEDLE LOCALIZATION Left 04/05/2016   Procedure: BREAST LUMPECTOMY WITH NEEDLE LOCALIZATION;  Surgeon: Hubbard Robinson, MD;  Location: [facility];  Service: General;  Laterality: Left;   BREAST LUMPECTOMY WITH NEEDLE LOCALIZATION AND AXILLARY SENTINEL LYMPH NODE BX Left 08/27/14   CHOLECYSTECTOMY  09/07/2013   COLONOSCOPY  WITH PROPOFOL N/A 08/28/2015   Procedure: COLONOSCOPY WITH PROPOFOL;  Surgeon: Lucilla Lame, MD;  Location: [place];  Service: Endoscopy;  Laterality: N/A;  Diabetic oral   COLONOSCOPY WITH PROPOFOL N/A 08/14/2020   Procedure: COLONOSCOPY WITH PROPOFOL;  Surgeon: Virgel Manifold, MD;  Location: [facility];  Service: Endoscopy;  Laterality: N/A;   ENDOSCOPIC MUCOSAL RESECTION N/A 09/10/2020   Procedure: ENDOSCOPIC MUCOSAL RESECTION;  Surgeon: Rush Landmark Telford Nab., MD;  Location: [place];  Service: Gastroenterology;  Laterality: N/A;   ESOPHAGOGASTRODUODENOSCOPY  2014   gastritis   ESOPHAGOGASTRODUODENOSCOPY (EGD) WITH PROPOFOL N/A 04/16/2019   Procedure: ESOPHAGOGASTRODUODENOSCOPY (EGD) WITH PROPOFOL;  Surgeon: Virgel Manifold, MD;  Location: [facility];  Service: Endoscopy;  Laterality: N/A;   ESOPHAGOGASTRODUODENOSCOPY (EGD) WITH PROPOFOL N/A 10/03/2019   Procedure: ESOPHAGOGASTRODUODENOSCOPY (EGD) WITH PROPOFOL;  Surgeon: Virgel Manifold, MD;  Location: [facility];  Service: Endoscopy;  Laterality: N/A;   EUS N/A 09/10/2020   Procedure: LOWER ENDOSCOPIC ULTRASOUND (EUS);  Surgeon: Irving Copas., MD;  Location: [place];  Service: Gastroenterology;  Laterality: N/A;   FLEXIBLE SIGMOIDOSCOPY N/A 08/27/2020   Procedure: FLEXIBLE SIGMOIDOSCOPY;  Surgeon: Virgel Manifold, MD;  Location: [facility];  Service: Endoscopy;  Laterality: N/A;   FLEXIBLE SIGMOIDOSCOPY N/A 09/10/2020   Procedure: FLEXIBLE SIGMOIDOSCOPY;  Surgeon: Rush Landmark Telford Nab., MD;  Location: [place];  Service: Gastroenterology;  Laterality: N/A;  FOREIGN BODY REMOVAL  09/10/2020   Procedure: FOREIGN BODY REMOVAL ;  Surgeon: Rush Landmark Telford Nab., MD;  Location: [place];  Service: Gastroenterology;;   HEMOSTASIS CLIP PLACEMENT  09/10/2020   Procedure: HEMOSTASIS CLIP PLACEMENT;  Surgeon: Irving Copas., MD;  Location: [place];  Service:  Gastroenterology;;   POLYPECTOMY  08/28/2015   Procedure: POLYPECTOMY INTESTINAL;  Surgeon: Lucilla Lame, MD;  Location: [place];  Service: Endoscopy;;  Rectal polyp   SUBMUCOSAL LIFTING INJECTION  09/10/2020   Procedure: SUBMUCOSAL LIFTING INJECTION;  Surgeon: Irving Copas., MD;  Location: [place];  Service: Gastroenterology;;   TUBAL LIGATION Bilateral    UMBILICAL HERNIA REPAIR N/A 08/09/2016   Procedure: HERNIA REPAIR UMBILICAL ADULT;  Surgeon: Olean Ree, MD;  Location: [facility];  Service: General;  Laterality: N/A;   VENTRAL HERNIA REPAIR N/A 04/10/2015   Procedure: HERNIA REPAIR VENTRAL ADULT;  Surgeon: Leonie Green, MD;  Location: [facility];  Service: General;  Laterality: N/A;    Family History  Problem Relation Age of Onset   Cancer Mother        Pancreatic   Cancer Father        Throat   Healthy Sister    Cancer Sister        liver   HIV Brother    Cancer Maternal Grandmother        Breast   Breast cancer Maternal Grandmother 75    Social History:  reports that she has been smoking cigarettes. She has a 22.00 pack-year smoking history. She has never used smokeless tobacco. She reports current alcohol use of about 2.0 standard drinks of alcohol per week. She reports that she does not use drugs.  She has cut back on smoking to 6 cigarettes daily. She lost her husband in 05/2018.  She lives in [place]. The patient is alone today.  Allergies: No Known Allergies  Current Medications: Current Outpatient Medications  Medication Sig Dispense Refill   acetaminophen (TYLENOL) 325 MG tablet Take 650 mg by mouth daily as needed for moderate pain.     Alcohol Swabs (B-D SINGLE USE SWABS REGULAR) PADS 1 each by Does not apply route 2 (two) times daily. 100 each 3   amLODipine (NORVASC) 10 MG tablet Take 2 tablets (20 mg total) by mouth daily. 60 tablet 2   aspirin EC 81 MG tablet Take 81 mg by mouth daily.     atorvastatin (LIPITOR) 20 MG tablet Take  by mouth.     Blood Glucose Calibration (TRUE METRIX LEVEL 1) Low SOLN 1 each by In Vitro route daily as needed. 1 each 3   Blood Glucose Monitoring Suppl (TRUE METRIX METER) DEVI 1 each by Does not apply route daily. 100 Device 3   calcium-vitamin D (OSCAL WITH D) 500-200 MG-UNIT TABS tablet Take 1 tablet by mouth daily.     glucose blood (PRECISION QID TEST) test strip      hydrochlorothiazide (HYDRODIURIL) 25 MG tablet Take 1 tablet (25 mg total) by mouth daily. 90 tablet 1   irbesartan (AVAPRO) 300 MG tablet TAKE ONE TABLET BY MOUTH ONCE DAILY 90 tablet 0   metFORMIN (GLUCOPHAGE-XR) 500 MG 24 hr tablet TAKE ONE TABLET BY MOUTH EVERY MORNING WITH BREAKFAST 90 tablet 1   No current facility-administered medications for this visit.   Review of Systems  Constitutional:  Negative for chills, diaphoresis, fever, malaise/fatigue and weight loss (up 2 lbs).  HENT: Negative.  Negative for congestion, ear discharge, ear pain,  hearing loss, nosebleeds, sinus pain, sore throat and tinnitus.   Eyes: Negative.  Negative for blurred vision and double vision.  Respiratory: Negative.  Negative for cough, hemoptysis, sputum production, shortness of breath and wheezing.   Cardiovascular:  Negative for chest pain, palpitations, orthopnea, leg swelling and PND.       Hypertension.  Gastrointestinal: Negative.  Negative for abdominal pain, blood in stool, constipation, diarrhea, heartburn, melena, nausea and vomiting.  Genitourinary: Negative.  Negative for dysuria, frequency, hematuria and urgency.  Musculoskeletal:  Positive for back pain (chronic lower back cramps) and joint pain (right shoulder, limited ROM). Negative for falls, myalgias and neck pain.        Intermittent soreness around previous lumpectomy sites.  Skin:  Negative for itching and rash.  Neurological: Negative.  Negative for dizziness, tremors, sensory change, speech change, focal weakness, weakness and headaches.  Endo/Heme/Allergies:  Negative.  Does not bruise/bleed easily.  Psychiatric/Behavioral: Negative.  Negative for depression and memory loss. The patient is not nervous/anxious and does not have insomnia.   All other systems reviewed and are negative.    Vitals Blood pressure 127/67, pulse 84, temperature 98.4 F (36.9 C), resp. rate 16, weight 198 lb 8.4 oz (90.1 kg), SpO2 95 %.  Performance status (ECOG): 1 Physical Exam Vitals and nursing note reviewed.  Constitutional:      General: She is not in acute distress.    Appearance: She is well-developed. She is not diaphoretic.  Eyes:     General: No scleral icterus.    Conjunctiva/sclera: Conjunctivae normal.  Cardiovascular:     Rate and Rhythm: Normal rate and regular rhythm.  Pulmonary:     Effort: Pulmonary effort is normal.     Breath sounds: Normal breath sounds.  Abdominal:     General: Abdomen is flat.  Neurological:     Mental Status: She is alert and oriented to person, place, and time.  Psychiatric:        Mood and Affect: Mood normal.   Breast exam was performed in seated and lying down position. History of left lumpectomy with a well-healed surgical scar.  There is significant tissue thickening at the site of lumpectomy/above and to the right of the nipple.  Tender with palpation.  No palpable mass of right breast.  No palpable axillary lymphadenopathy bilaterally.  RADIOGRAPHIC STUDIES: I have personally reviewed the radiological images as listed and agreed with the findings in the report. US Venous Img Lower Unilateral Left  Result Date: 10/19/2020 CLINICAL DATA:  Left lower leg pain and edema EXAM: LEFT LOWER EXTREMITY VENOUS DOPPLER ULTRASOUND TECHNIQUE: Gray-scale sonography with compression, as well as color and duplex ultrasound, were performed to evaluate the deep venous system(s) from the level of the common femoral vein through the popliteal and proximal calf veins. COMPARISON:  None. FINDINGS: VENOUS Normal compressibility of the  common femoral, superficial femoral, and popliteal veins, as well as the visualized calf veins. Visualized portions of profunda femoral vein and great saphenous vein unremarkable. No filling defects to suggest DVT on grayscale or color Doppler imaging. Doppler waveforms show normal direction of venous flow, normal respiratory plasticity and response to augmentation. Limited views of the contralateral common femoral vein are unremarkable. OTHER None. Limitations: none IMPRESSION: No lower extremity DVT Electronically Signed   By: Miachel Roux M.D.   On: 10/19/2020 11:26   DG Foot Complete Left  Result Date: 10/19/2020 CLINICAL DATA:  Struck left foot on a table 2 weeks ago, pain and swelling  at third metatarsal area EXAM: LEFT FOOT - COMPLETE 3+ VIEW COMPARISON:  None. FINDINGS: There is no evidence of acute fracture. Osteopenia. No osseous erosion/destruction. There is mild midfoot degenerative arthritis. IMPRESSION: No evidence of acute fracture.  Mild midfoot osteoarthritis. Electronically Signed   By: Maurine Simmering   On: 10/19/2020 09:44     Labs are reviewed and discussed with patient. CBC    Component Value Date/Time   WBC 7.6 12/31/2020 1005   RBC 4.75 12/31/2020 1005   HGB 13.1 12/31/2020 1005   HGB 13.6 05/26/2017 0928   HCT 39.5 12/31/2020 1005   HCT 40.7 05/26/2017 0928   PLT 264 12/31/2020 1005   PLT 231 05/26/2017 0928   MCV 83.2 12/31/2020 1005   MCV 80 05/26/2017 0928   MCV 83 09/09/2014 1106   MCH 27.6 12/31/2020 1005   MCHC 33.2 12/31/2020 1005   RDW 13.7 12/31/2020 1005   RDW 14.6 05/26/2017 0928   RDW 13.7 09/09/2014 1106   LYMPHSABS 3.1 12/31/2020 1005   LYMPHSABS 2.5 05/26/2017 0928   LYMPHSABS 2.7 09/09/2014 1106   MONOABS 0.4 12/31/2020 1005   MONOABS 0.4 09/09/2014 1106   EOSABS 0.1 12/31/2020 1005   EOSABS 0.1 05/26/2017 0928   EOSABS 0.2 09/09/2014 1106   BASOSABS 0.1 12/31/2020 1005   BASOSABS 0.0 05/26/2017 0928   BASOSABS 0.0 09/09/2014 1106   CMP  Latest Ref Rng & Units 12/31/2020 09/30/2020 08/10/2020  Glucose 70 - 99 mg/dL 124(H) - 297(H)  BUN 6 - 20 mg/dL 15 - 17  Creatinine 0.44 - 1.00 mg/dL 1.04(H) 0.90 0.90  Sodium 135 - 145 mmol/L 133(L) - 134(L)  Potassium 3.5 - 5.1 mmol/L 3.6 - 3.7  Chloride 98 - 111 mmol/L 101 - 101  CO2 22 - 32 mmol/L 25 - 21(L)  Calcium 8.9 - 10.3 mg/dL 8.9 - 9.2  Total Protein 6.5 - 8.1 g/dL 7.7 - 7.7  Total Bilirubin 0.3 - 1.2 mg/dL 0.5 - 0.5  Alkaline Phos 38 - 126 U/L 85 - 79  AST 15 - 41 U/L 20 - 25  ALT 0 - 44 U/L 21 - 21      Assessment/ Plan: 1. Primary neuroendocrine carcinoma of rectum ([place])   2. Smoking history   3. Malignant neoplasm of left breast in female, estrogen receptor positive, unspecified site of breast ([place])   4. Osteopenia, unspecified location    #History of Stage IIA LEFT breast cancer Finished 5 years of endocrine therapy on 03/13/2020 BCI testing on 08/20/2019 revealed no benefit from extended endocrine therapy Continue annual diagnostic mammogram.  Due in February 2022.  #Stage I neuroendocrine carcinoma of the rectum. No T stage on pathology Status post resection.  Negative though close margin point 1 mm. Baseline CT abdomen pelvis was negative. Discussed with patient about NCCN guideline of surveillance. Given that the polyp size is above 1 cm, I recommend an MRI pelvis rectal protocol for 3 months surveillance.  Patient agrees with the plan She will need a 1 year EUS with GI. role of chromogranin A (CgA) in surveillance is controversial as the contribution to detection of recurrent disease is limited  #  Osteopenia Bone density on 03/03/2020 revealed osteopenia with a T-score of -2.2 (improved). Recommend patient to continue calcium and vitamin D.  #Tobacco use.  Extensive smoking history Recommend patient to continue lung cancer screening.  Refer to [place] lung cancer screening program. Due October 2022.   Follow-up with lab MD 4 months, CBC CMP  I  discussed the assessment and treatment plan with the patient.  The patient was provided an opportunity to ask questions and all were answered.  The patient agreed with the plan and demonstrated an understanding of the instructions.  The patient was advised to call back if the symptoms worsen or if the condition fails to improve as anticipated.  A total of 40 minutes was spent on this visit.  With 15 minutes spent reviewing image findings, pathology reports, 20 minutes counseling the patient on the diagnosis, goal of care, surveillance plan.  Additional 5 minutes was spent on answering patient's questions.  Earlie Server, MD, PhD Hematology Oncology [facility] at [facility] 12/31/2020

## 2021-01-01 LAB — CANCER ANTIGEN 27.29: CA 27.29: 15.2 U/mL (ref 0.0–38.6)

## 2021-01-06 ENCOUNTER — Other Ambulatory Visit: Payer: Self-pay | Admitting: Internal Medicine

## 2021-01-06 DIAGNOSIS — I10 Essential (primary) hypertension: Secondary | ICD-10-CM

## 2021-01-09 ENCOUNTER — Other Ambulatory Visit: Payer: Self-pay | Admitting: Internal Medicine

## 2021-01-09 DIAGNOSIS — I10 Essential (primary) hypertension: Secondary | ICD-10-CM

## 2021-01-09 NOTE — Telephone Encounter (Signed)
Requested Prescriptions  Pending Prescriptions Disp Refills  . irbesartan (AVAPRO) 300 MG tablet [Pharmacy Med Name: irbesartan 300 mg tablet] 90 tablet 0    Sig: TAKE ONE TABLET BY MOUTH ONCE DAILY     Cardiovascular:  Angiotensin Receptor Blockers Failed - 01/09/2021  9:46 AM      Failed - Cr in normal range and within 180 days    Creatinine  Date Value Ref Range Status  09/09/2014 0.67 mg/dL Final    Comment:    0.44-1.00 NOTE: New Reference Range  08/05/14    Creatinine, Ser  Date Value Ref Range Status  12/31/2020 1.04 (H) 0.44 - 1.00 mg/dL Final         Passed - K in normal range and within 180 days    Potassium  Date Value Ref Range Status  12/31/2020 3.6 3.5 - 5.1 mmol/L Final  09/09/2014 4.3 mmol/L Final    Comment:    3.5-5.1 NOTE: New Reference Range  08/05/14          Passed - Patient is not pregnant      Passed - Last BP in normal range    BP Readings from Last 1 Encounters:  12/31/20 127/67         Passed - Valid encounter within last 6 months    Recent Outpatient Visits          3 weeks ago [facility] Glean Hess, MD   2 months ago Type II diabetes mellitus with complication [facility])   [facility] Glean Hess, MD   2 months ago Gross hematuria   [facility] Glean Hess, MD   5 months ago Type II diabetes mellitus with complication [facility])   [facility] Glean Hess, MD   12 months ago Vaginal itching   [facility] Glean Hess, MD      Future Appointments            In 1 month [facility] Melia Jesse Sans, MD [facility]

## 2021-01-11 ENCOUNTER — Ambulatory Visit: Payer: Medicare Other

## 2021-01-12 ENCOUNTER — Other Ambulatory Visit: Payer: Self-pay

## 2021-01-12 ENCOUNTER — Ambulatory Visit
Admission: RE | Admit: 2021-01-12 | Discharge: 2021-01-12 | Disposition: A | Discharge status: Home / Self Care | Payer: Medicare Other | Source: Ambulatory Visit | Attending: Oncology | Admitting: Oncology

## 2021-01-12 DIAGNOSIS — K6289 Other specified diseases of anus and rectum: Secondary | ICD-10-CM | POA: Diagnosis not present

## 2021-01-12 DIAGNOSIS — C7A026 Malignant carcinoid tumor of the rectum: Secondary | ICD-10-CM | POA: Diagnosis not present

## 2021-01-12 DIAGNOSIS — Z86012 Personal history of benign carcinoid tumor: Secondary | ICD-10-CM | POA: Diagnosis not present

## 2021-01-12 DIAGNOSIS — C7A8 Other malignant neuroendocrine tumors: Secondary | ICD-10-CM | POA: Insufficient documentation

## 2021-01-12 DIAGNOSIS — K6389 Other specified diseases of intestine: Secondary | ICD-10-CM | POA: Diagnosis not present

## 2021-01-14 ENCOUNTER — Other Ambulatory Visit: Payer: Self-pay

## 2021-01-14 ENCOUNTER — Telehealth: Payer: Self-pay

## 2021-01-14 DIAGNOSIS — R935 Abnormal findings on diagnostic imaging of other abdominal regions, including retroperitoneum: Secondary | ICD-10-CM

## 2021-01-14 DIAGNOSIS — R9389 Abnormal findings on diagnostic imaging of other specified body structures: Secondary | ICD-10-CM

## 2021-01-14 DIAGNOSIS — C7A8 Other malignant neuroendocrine tumors: Secondary | ICD-10-CM

## 2021-01-14 NOTE — Telephone Encounter (Signed)
Please schedule patient for US pelvis- next avail and notify her of app details.

## 2021-01-14 NOTE — Telephone Encounter (Signed)
Spoke to pt and notified her about results. She states she is having som pain to right lower back.

## 2021-01-14 NOTE — Telephone Encounter (Signed)
Patient called wanting results from MRI Pelvis done on 8/16. Please advise.

## 2021-01-15 ENCOUNTER — Encounter: Payer: Self-pay | Admitting: Urgent Care

## 2021-01-20 ENCOUNTER — Telehealth: Payer: Self-pay | Admitting: Oncology

## 2021-01-20 DIAGNOSIS — M47817 Spondylosis without myelopathy or radiculopathy, lumbosacral region: Secondary | ICD-10-CM | POA: Diagnosis not present

## 2021-01-20 NOTE — Telephone Encounter (Signed)
I spoke to her last week regarding this and was notified that she needs a pelvic US, not CT. Looks like she is scheduled for [place] on 9/1. Will you please remind her of appt. Thanks

## 2021-01-20 NOTE — Telephone Encounter (Signed)
Patient called and stated she was told that due to the results of her MRI last week she needed a CT scan. She would like an update on whether that is still reccommended.   No order currently in, please advise.

## 2021-01-28 ENCOUNTER — Ambulatory Visit: Payer: Medicare Other

## 2021-02-03 ENCOUNTER — Other Ambulatory Visit: Payer: Self-pay

## 2021-02-03 ENCOUNTER — Ambulatory Visit
Admission: RE | Admit: 2021-02-03 | Discharge: 2021-02-03 | Disposition: A | Discharge status: Home / Self Care | Payer: Medicare Other | Source: Ambulatory Visit | Attending: Oncology | Admitting: Oncology

## 2021-02-03 ENCOUNTER — Telehealth: Payer: Self-pay | Admitting: *Deleted

## 2021-02-03 DIAGNOSIS — Z853 Personal history of malignant neoplasm of breast: Secondary | ICD-10-CM | POA: Diagnosis not present

## 2021-02-03 DIAGNOSIS — Z780 Asymptomatic menopausal state: Secondary | ICD-10-CM | POA: Diagnosis not present

## 2021-02-03 DIAGNOSIS — R935 Abnormal findings on diagnostic imaging of other abdominal regions, including retroperitoneum: Secondary | ICD-10-CM | POA: Insufficient documentation

## 2021-02-03 DIAGNOSIS — N840 Polyp of corpus uteri: Secondary | ICD-10-CM

## 2021-02-03 HISTORY — DX: Polyp of corpus uteri: N84.0

## 2021-02-03 NOTE — Telephone Encounter (Signed)
Called report  IMPRESSION: Nonvisualization of ovaries.   Lenticular nodule within endometrial canal at lower uterine segment 18 x 5 x 10 mm question endometrial polyp versus neoplasm; in the setting of post-menopausal bleeding, endometrial sampling is indicated to exclude carcinoma. If results are benign, sonohysterogram should be considered for focal lesion work-up prior to hysteroscopy. (Ref: Radiological Reasoning: Algorithmic Workup of Abnormal Vaginal Bleeding with Endovaginal Sonography and Sonohysterography. AJR [id number])   These results will be called to the ordering clinician or representative by the Radiologist Assistant, and communication documented in the PACS or [employer].     Electronically Signed   By: Lavonia Dana M.D.   On: 02/03/2021 14:15

## 2021-02-04 ENCOUNTER — Other Ambulatory Visit: Payer: Self-pay

## 2021-02-04 DIAGNOSIS — R935 Abnormal findings on diagnostic imaging of other abdominal regions, including retroperitoneum: Secondary | ICD-10-CM

## 2021-02-04 DIAGNOSIS — R9389 Abnormal findings on diagnostic imaging of other specified body structures: Secondary | ICD-10-CM

## 2021-02-04 NOTE — Telephone Encounter (Signed)
Spoke to patient regarding MD recommendation and she does not have a GYN. Referral sent to [facility].

## 2021-02-08 ENCOUNTER — Other Ambulatory Visit: Payer: Self-pay | Admitting: *Deleted

## 2021-02-08 DIAGNOSIS — F17210 Nicotine dependence, cigarettes, uncomplicated: Secondary | ICD-10-CM

## 2021-02-09 DIAGNOSIS — M79672 Pain in left foot: Secondary | ICD-10-CM | POA: Diagnosis not present

## 2021-02-09 DIAGNOSIS — M79671 Pain in right foot: Secondary | ICD-10-CM | POA: Diagnosis not present

## 2021-02-09 DIAGNOSIS — E119 Type 2 diabetes mellitus without complications: Secondary | ICD-10-CM | POA: Diagnosis not present

## 2021-02-09 DIAGNOSIS — D2371 Other benign neoplasm of skin of right lower limb, including hip: Secondary | ICD-10-CM | POA: Diagnosis not present

## 2021-02-16 ENCOUNTER — Ambulatory Visit
Admission: RE | Admit: 2021-02-16 | Discharge: 2021-02-16 | Disposition: A | Discharge status: Home / Self Care | Payer: Medicare Other | Source: Ambulatory Visit | Attending: Acute Care | Admitting: Acute Care

## 2021-02-16 ENCOUNTER — Other Ambulatory Visit: Payer: Self-pay

## 2021-02-16 DIAGNOSIS — F17210 Nicotine dependence, cigarettes, uncomplicated: Secondary | ICD-10-CM

## 2021-02-18 ENCOUNTER — Other Ambulatory Visit: Payer: Self-pay | Admitting: *Deleted

## 2021-02-18 DIAGNOSIS — F17210 Nicotine dependence, cigarettes, uncomplicated: Secondary | ICD-10-CM

## 2021-02-23 ENCOUNTER — Ambulatory Visit (INDEPENDENT_AMBULATORY_CARE_PROVIDER_SITE_OTHER): Payer: Medicare Other | Admitting: Obstetrics and Gynecology

## 2021-02-23 ENCOUNTER — Encounter: Payer: Self-pay | Admitting: Obstetrics and Gynecology

## 2021-02-23 ENCOUNTER — Other Ambulatory Visit (HOSPITAL_COMMUNITY)
Admission: RE | Admit: 2021-02-23 | Discharge: 2021-02-23 | Disposition: A | Discharge status: Home / Self Care | Payer: Medicare Other | Source: Ambulatory Visit | Attending: Obstetrics and Gynecology | Admitting: Obstetrics and Gynecology

## 2021-02-23 ENCOUNTER — Other Ambulatory Visit: Payer: Self-pay

## 2021-02-23 VITALS — BP 128/72 | Ht 68.0 in | Wt 201.0 lb

## 2021-02-23 DIAGNOSIS — Z124 Encounter for screening for malignant neoplasm of cervix: Secondary | ICD-10-CM | POA: Insufficient documentation

## 2021-02-23 DIAGNOSIS — Z1151 Encounter for screening for human papillomavirus (HPV): Secondary | ICD-10-CM | POA: Diagnosis not present

## 2021-02-23 DIAGNOSIS — Z01411 Encounter for gynecological examination (general) (routine) with abnormal findings: Secondary | ICD-10-CM | POA: Insufficient documentation

## 2021-02-23 DIAGNOSIS — R935 Abnormal findings on diagnostic imaging of other abdominal regions, including retroperitoneum: Secondary | ICD-10-CM | POA: Insufficient documentation

## 2021-02-23 NOTE — Progress Notes (Signed)
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  No vaginal bleeding.  No exogenous hormone exposure.  No new onset symptoms or gyn complaints.  Review of Systems: Review of Systems  Constitutional: Negative.   Gastrointestinal: Negative.   Genitourinary: Negative.     Past Medical History:  Past Medical History:  Diagnosis Date   Arthritis    shoulders and neck   Breast cancer ([place]) 2016    Left breast with chemo + rad tx's with lumpectomy.   Breast wound, left, sequela 06/23/2016   Cancer of gastrointestinal tract ([place]) 08/2020   COPD (chronic obstructive pulmonary disease) ([place])    Diabetes mellitus without complication ([place]) 09/275   Elevated LFTs    Fatigue 08/01/2016   GERD (gastroesophageal reflux disease)    Gout    Hydradenitis 01/23/2015   Hyperlipidemia    Hypertension    Mastitis 10/15/2016   Personal history of chemotherapy 2016   left breast ca   Personal history of radiation therapy 2016   LEFT lumpectomy   Personal history of radiation therapy    Positive PPD, treated    Shortness of breath dyspnea    with exertion    Past Surgical History:  Past Surgical History:  Procedure Laterality Date   BREAST BIOPSY Left 07/31/2014   [facility] and DCIS    BREAST BIOPSY Left 03/08/2016   INTRADUCTAL PAPILLOMA    BREAST LUMPECTOMY Left 07/2014   [facility], DCIS, clear margins, positive LN   BREAST LUMPECTOMY Left 04/05/2016   excision of intraductal papilloma, no malignancy or atypia   BREAST LUMPECTOMY WITH NEEDLE LOCALIZATION Left 04/05/2016   Procedure: BREAST LUMPECTOMY WITH NEEDLE LOCALIZATION;  Surgeon: Hubbard Robinson, MD;  Location: [facility];   Service: General;  Laterality: Left;   BREAST LUMPECTOMY WITH NEEDLE LOCALIZATION AND AXILLARY SENTINEL LYMPH NODE BX Left 08/27/14   CHOLECYSTECTOMY  09/07/2013   COLONOSCOPY WITH PROPOFOL N/A 08/28/2015   Procedure: COLONOSCOPY WITH PROPOFOL;  Surgeon: Lucilla Lame, MD;  Location: [place];  Service: Endoscopy;  Laterality: N/A;  Diabetic oral   COLONOSCOPY WITH PROPOFOL N/A 08/14/2020   Procedure: COLONOSCOPY WITH PROPOFOL;  Surgeon: Virgel Manifold, MD;  Location: [facility];  Service: Endoscopy;  Laterality: N/A;   ENDOSCOPIC MUCOSAL RESECTION N/A 09/10/2020   Procedure: ENDOSCOPIC MUCOSAL RESECTION;  Surgeon: Rush Landmark Telford Nab., MD;  Location: [place];  Service: Gastroenterology;  Laterality: N/A;   ESOPHAGOGASTRODUODENOSCOPY  2014   gastritis   ESOPHAGOGASTRODUODENOSCOPY (EGD) WITH PROPOFOL N/A 04/16/2019   Procedure: ESOPHAGOGASTRODUODENOSCOPY (EGD) WITH PROPOFOL;  Surgeon: Virgel Manifold, MD;  Location: [facility];  Service: Endoscopy;  Laterality: N/A;   ESOPHAGOGASTRODUODENOSCOPY (EGD) WITH PROPOFOL N/A 10/03/2019   Procedure: ESOPHAGOGASTRODUODENOSCOPY (EGD) WITH PROPOFOL;  Surgeon: Virgel Manifold, MD;  Location: [facility];  Service: Endoscopy;  Laterality: N/A;   EUS N/A 09/10/2020   Procedure: LOWER ENDOSCOPIC ULTRASOUND (EUS);  Surgeon: Irving Copas., MD;  Location: [place];  Service: Gastroenterology;  Laterality: N/A;   FLEXIBLE SIGMOIDOSCOPY N/A 08/27/2020   Procedure: FLEXIBLE SIGMOIDOSCOPY;  Surgeon: Virgel Manifold, MD;  Location: [facility];  Service: Endoscopy;  Laterality: N/A;  FLEXIBLE SIGMOIDOSCOPY N/A 09/10/2020   Procedure: FLEXIBLE SIGMOIDOSCOPY;  Surgeon: Rush Landmark Telford Nab., MD;  Location: [place];  Service: Gastroenterology;  Laterality: N/A;   FOREIGN BODY REMOVAL  09/10/2020   Procedure: FOREIGN BODY REMOVAL ;  Surgeon: Rush Landmark Telford Nab., MD;  Location: [place];  Service:  Gastroenterology;;   HEMOSTASIS CLIP PLACEMENT  09/10/2020   Procedure: HEMOSTASIS CLIP PLACEMENT;  Surgeon: Irving Copas., MD;  Location: [place];  Service: Gastroenterology;;   POLYPECTOMY  08/28/2015   Procedure: POLYPECTOMY INTESTINAL;  Surgeon: Lucilla Lame, MD;  Location: [place];  Service: Endoscopy;;  Rectal polyp   SUBMUCOSAL LIFTING INJECTION  09/10/2020   Procedure: SUBMUCOSAL LIFTING INJECTION;  Surgeon: Irving Copas., MD;  Location: [place];  Service: Gastroenterology;;   TUBAL LIGATION Bilateral    UMBILICAL HERNIA REPAIR N/A 08/09/2016   Procedure: HERNIA REPAIR UMBILICAL ADULT;  Surgeon: Olean Ree, MD;  Location: [facility];  Service: General;  Laterality: N/A;   VENTRAL HERNIA REPAIR N/A 04/10/2015   Procedure: HERNIA REPAIR VENTRAL ADULT;  Surgeon: Leonie Green, MD;  Location: [facility];  Service: General;  Laterality: N/A;    Gynecologic History: No LMP recorded. Patient is postmenopausal.  Obstetric History: No obstetric history on file.  Family History:  Family History  Problem Relation Age of Onset   Cancer Mother        Pancreatic   Cancer Father        Throat   Healthy Sister    Cancer Sister        liver   HIV Brother    Cancer Maternal Grandmother        Breast   Breast cancer Maternal Grandmother 75    Social History:  Social History   Socioeconomic History   Marital status: Widowed    Spouse name: Not on file   Number of children: 4   Years of education: Not on file   Highest education level: 10th grade  Occupational History   Occupation: Disabled  Tobacco Use   Smoking status: Every Day    Packs/day: 0.50    Years: 44.00    Pack years: 22.00    Types: Cigarettes   Smokeless tobacco: Never  Vaping Use   Vaping Use: Never used  Substance and Sexual Activity   Alcohol use: Yes    Alcohol/week: 2.0 standard drinks    Types: 2 Cans of beer per week    Comment: weekends - beer    Drug use: No    Sexual activity: Not Currently  Other Topics Concern   Not on file  Social History Narrative   Pt lives alone, son lives 2 doors down.   Social Determinants of Health   Financial Resource Strain: Low Risk    Difficulty of Paying Living Expenses: Not very hard  Food Insecurity: No Food Insecurity   Worried About Charity fundraiser in the Last Year: Never true   Ran Out of Food in the Last Year: Never true  Transportation Needs: No Transportation Needs   Lack of Transportation (Medical): No   Lack of Transportation (Non-Medical): No  Physical Activity: Inactive   Days of Exercise per Week: 0 days   Minutes of Exercise per Session: 0 min  Stress: No Stress Concern Present   Feeling of Stress : Only a little  Social Connections: Moderately Isolated   Frequency of Communication with Friends and Family: More than three times a week   Frequency of Social Gatherings with  Friends and Family: Three times a week   Attends Religious Services: More than 4 times per year   Active Member of Clubs or Organizations: No   Attends Archivist Meetings: Never   Marital Status: Widowed  Human resources officer Violence: Not At Risk   Fear of Current or Ex-Partner: No   Emotionally Abused: No   Physically Abused: No   Sexually Abused: No    Allergies:  No Known Allergies  Medications: Prior to Admission medications   Medication Sig Start Date End Date Taking? Authorizing Provider  amLODipine (NORVASC) 10 MG tablet TAKE TWO TABLETS BY MOUTH ONCE DAILY 01/06/21  Yes Glean Hess, MD  aspirin EC 81 MG tablet Take 81 mg by mouth daily.   Yes [provider]  atorvastatin (LIPITOR) 20 MG tablet Take by mouth. 12/03/20 12/03/21 Yes [provider]  calcium-vitamin D (OSCAL WITH D) 500-200 MG-UNIT TABS tablet Take 1 tablet by mouth daily.   Yes [provider]  hydrochlorothiazide (HYDRODIURIL) 25 MG tablet Take 1 tablet (25 mg total) by mouth daily. 11/03/20  Yes  Glean Hess, MD  irbesartan (AVAPRO) 300 MG tablet TAKE ONE TABLET BY MOUTH ONCE DAILY 01/09/21  Yes Glean Hess, MD  metFORMIN (GLUCOPHAGE-XR) 500 MG 24 hr tablet TAKE ONE TABLET BY MOUTH EVERY MORNING WITH BREAKFAST 11/03/20  Yes Glean Hess, MD  acetaminophen (TYLENOL) 325 MG tablet Take 650 mg by mouth daily as needed for moderate pain. 09/08/13   [provider]  Alcohol Swabs (B-D SINGLE USE SWABS REGULAR) PADS 1 each by Does not apply route 2 (two) times daily. 11/22/17   Glean Hess, MD  Blood Glucose Calibration (TRUE METRIX LEVEL 1) Low SOLN 1 each by In Vitro route daily as needed. 11/22/17   Glean Hess, MD  Blood Glucose Monitoring Suppl (TRUE METRIX METER) DEVI 1 each by Does not apply route daily. 12/15/17   Glean Hess, MD  gabapentin (NEURONTIN) 300 MG capsule Take by mouth.    [provider]  glucose blood (PRECISION QID TEST) test strip  10/17/14   [provider]  traMADol (ULTRAM) 50 MG tablet Take by mouth.    [provider]    Physical Exam Vitals:  Vitals:   02/23/21 0930  BP: 128/72   No LMP recorded. Patient is postmenopausal.  General: NAD HEENT: normocephalic, anicteric Pulmonary: No increased work of breathing Genitourinary:  External: Normal external female genitalia.  Normal urethral meatus, normal  Bartholin's and Skene's glands.    Vagina: Normal vaginal mucosa, no evidence of prolapse.    Cervix: Grossly normal in appearance, no bleeding  Uterus: Non-enlarged, mobile, normal contour.  No CMT  Adnexa: ovaries non-enlarged, no adnexal masses  Rectal: deferred  Lymphatic: no evidence of inguinal lymphadenopathy Extremities: no edema, erythema, or tenderness Neurologic: Grossly intact Psychiatric: mood appropriate, affect full  Female chaperone present for pelvic  portions of the physical exam   ENDOMETRIAL BIOPSY     The indications for endometrial biopsy were reviewed.   Risks of  the biopsy including cramping, bleeding, infection, uterine perforation, inadequate specimen and need for additional procedures  were discussed. The patient states she understands and agrees to undergo procedure today. Consent was signed. Time out was performed. Urine HCG was negative. A Graves speculum was placed and the cervix was brought into view.  The cervix was prepped with Betadine. A single-toothed tenaculum was  placed on the anterior lip of the cervix for  traction. A 3 mm pipelle was introduced through the cervix into the endometrial cavity without difficulty to a depth of 8cm, and a small  amount of tissue was obtained in two passes, the resulting specime sent to pathology. The instruments were removed from the patient's vagina. Minimal bleeding from the cervix was noted. The patient tolerated the procedure well. Routine post-procedure instructions were given to the patient.  She will be contacted by phone one results become available.     Malachy Mood, MD, FACOG [facility], [facility]  CPT [phone]   CT CHEST LUNG CA SCREEN LOW DOSE W/O CM  Result Date: 02/16/2021 CLINICAL DATA:  Current smoker with 30 pack-year history EXAM: CT CHEST WITHOUT CONTRAST LOW-DOSE FOR LUNG CANCER SCREENING TECHNIQUE: Multidetector CT imaging of the chest was performed following the standard protocol without IV contrast. COMPARISON:  CT lung cancer screening dated February 05, 2020 FINDINGS: Cardiovascular: Normal heart size. No pericardial effusion. No significant coronary artery calcifications. Atherosclerotic disease of the thoracic aorta. Mediastinum/Nodes: Small hiatal hernia. Pathologically enlarged lymph nodes seen in the chest. Lungs/Pleura: Central airways are patent. Subpleural reticular opacities of the anterior left upper lobe, likely postradiation change. Consolidation, pleural effusion or pneumothorax scattered calcified pulmonary nodules, likely sequela of prior granulomatous infection.  Mild centrilobular and paraseptal emphysema. Stable solid pulmonary nodule of the left upper lobe measuring 3 mm in mean diameter on series 3, image 70. Upper Abdomen: No acute abnormality. Musculoskeletal: No chest wall mass or suspicious bone lesions identified. IMPRESSION: Lung-RADS 2, benign appearance or behavior. Continue annual screening with low-dose chest CT without contrast in 12 months. Aortic Atherosclerosis (ICD10-I70.0). Electronically Signed   By: Yetta Glassman M.D.   On: 02/16/2021 13:50   US PELVIC COMPLETE WITH TRANSVAGINAL  Result Date: 02/03/2021 CLINICAL DATA:  Abnormal uterus on pelvic MR, material within endometrial canal at lower uterine segment. History of breast cancer in 2016. Postmenopausal EXAM: TRANSABDOMINAL AND TRANSVAGINAL ULTRASOUND OF PELVIS TECHNIQUE: Both transabdominal and transvaginal ultrasound examinations of the pelvis were performed. Transabdominal technique was performed for global imaging of the pelvis including uterus, ovaries, adnexal regions, and pelvic cul-de-sac. It was necessary to proceed with endovaginal exam following the transabdominal exam to visualize the uterus, endometrium, and ovaries. COMPARISON:  MR pelvis 01/12/2021 FINDINGS: Uterus Measurements: 6.7 x 4.1 x 4.8 cm = volume: 69 mL. Retroverted. Mildly heterogeneous myometrium. Normal morphology without mass Endometrium Thickness: 3 mm. Small amount of endometrial fluid. Lenticular hyperechoic heterogeneous nodule within endometrial canal at lower uterine segment 18 x 5 x 10 mm question endometrial polyp versus neoplasm. Right ovary Not visualized, likely obscured by bowel Left ovary Not visualized, likely obscured by bowel Other findings No free pelvic fluid.  No adnexal masses. IMPRESSION: Nonvisualization of ovaries. Lenticular nodule within endometrial canal at lower uterine segment 18 x 5 x 10 mm question endometrial polyp versus neoplasm; in the setting of post-menopausal bleeding, endometrial  sampling is indicated to exclude carcinoma. If results are benign, sonohysterogram should be considered for focal lesion work-up prior to hysteroscopy. (Ref: Radiological Reasoning: Algorithmic Workup of Abnormal Vaginal Bleeding with Endovaginal Sonography and Sonohysterography. AJR 2008; 665:L93-57) These results will be called to the ordering clinician or representative by the Radiologist Assistant, and communication documented in the PACS or [employer]. Electronically Signed   By: Lavonia Dana M.D.   On: 02/03/2021 14:15     Assessment: 60 y.o. with endometrial polyp  Plan: Problem List Items Addressed This Visit   None Visit Diagnoses  Screening for malignant neoplasm of cervix    -  Primary   Relevant Orders   Cytology - PAP   Abnormal endometrial ultrasound       Relevant Orders   Surgical pathology      1) Endometrial polyp - focal lesions generally will require hysteroscopy D&C for complete resection.  However, given her prior cancer history she was offered endometrial biopsy in the interim while we wait to schedule definitive treatment for reassureance purposes which she accepted. Given endometrial thickness of <87mm concern for malignancy is low.   Pap smear was also brought up to date  2) A total of 15 minutes were spent in face-to-face contact with the patient during this encounter with over half of that time devoted to counseling and coordination of care.  Prior imaging was independently reviewed.  3) Return if symptoms worsen or fail to improve.     Malachy Mood, MD, [facility], [facility] 02/23/2021, 10:03 AM

## 2021-02-24 LAB — CYTOLOGY - PAP
Comment: NEGATIVE
Diagnosis: NEGATIVE
High risk HPV: NEGATIVE

## 2021-02-24 LAB — SURGICAL PATHOLOGY

## 2021-03-01 ENCOUNTER — Telehealth: Payer: Self-pay

## 2021-03-01 NOTE — Telephone Encounter (Signed)
Pt calling for biopsy results.  [phone]  Courtesy call to pt that AMS is on call today.  Pt states she started bleeding (not heavy) and cramping real bad over the weekend.

## 2021-03-05 ENCOUNTER — Ambulatory Visit (INDEPENDENT_AMBULATORY_CARE_PROVIDER_SITE_OTHER): Payer: Medicare Other | Admitting: Internal Medicine

## 2021-03-05 ENCOUNTER — Telehealth: Payer: Self-pay

## 2021-03-05 ENCOUNTER — Encounter: Payer: Self-pay | Admitting: Internal Medicine

## 2021-03-05 ENCOUNTER — Other Ambulatory Visit: Payer: Self-pay

## 2021-03-05 VITALS — BP 118/68 | HR 80 | Temp 98.4°F | Ht 68.0 in | Wt 198.0 lb

## 2021-03-05 DIAGNOSIS — E118 Type 2 diabetes mellitus with unspecified complications: Secondary | ICD-10-CM

## 2021-03-05 DIAGNOSIS — I10 Essential (primary) hypertension: Secondary | ICD-10-CM | POA: Diagnosis not present

## 2021-03-05 DIAGNOSIS — Z87891 Personal history of nicotine dependence: Secondary | ICD-10-CM | POA: Diagnosis not present

## 2021-03-05 DIAGNOSIS — Z23 Encounter for immunization: Secondary | ICD-10-CM

## 2021-03-05 LAB — POCT GLYCOSYLATED HEMOGLOBIN (HGB A1C): Hemoglobin A1C: 6 % — AB (ref 4.0–5.6)

## 2021-03-05 NOTE — Telephone Encounter (Signed)
-----   Message from Malachy Mood, MD sent at 03/01/2021 11:30 AM EDT ----- Regarding: Surgery Surgery Booking Request Patient Full Name:  Donna Riley  MRN: [id number]  DOB: [birth year]  Surgeon: Malachy Mood, MD  Requested Surgery Date and Time: next 1-6 weeks Primary Diagnosis AND Code: Endometrial polyp Secondary Diagnosis and Code:  Surgical Procedure: Hysteroscopy D&C RNFA Requested?: No L&D Notification: No Admission Status: same day surgery Length of Surgery: 50 min Special Case Needs: No H&P: Yes  Phone Interview???:  No Interpreter: No Medical Clearance:  No Special Scheduling Instructions: No Any known health/anesthesia issues, diabetes, sleep apnea, latex allergy, defibrillator/pacemaker?: No Acuity: P3   (P1 highest, P2 delay may cause harm, P3 low, elective gyn, P4 lowest) Post op follow up visits: 1 week and 6 week

## 2021-03-05 NOTE — Telephone Encounter (Signed)
Called patient to schedule Hysteroscopy D&C w Georgianne Fick  DOS 10/27  H&P 10/20 @ 11:30   Pre-admit phone call appointment to be requested - date and time will be included on H&P paper work. Also all appointments will be updated on pt MyChart. Explained that this appointment has a call window. Based on the time scheduled will indicate if the call will be received within a 4 hour window before 1:00 or after.  Advised that pt may also receive calls from the hospital pharmacy and pre-service center.  Confirmed pt has [facility] Medicare/Medicaid as primary insurance.

## 2021-03-05 NOTE — Progress Notes (Signed)
Date:  03/05/2021   Name:  Donna Riley   DOB:  Feb 16, 1961   MRN:  [id number]   Chief Complaint: Diabetes (Last BS 125 this morning), Hypertension, and Flu Vaccine Recent endometrial biopsy that was benign. Diabetes She presents for her follow-up diabetic visit. She has type 2 diabetes mellitus. Pertinent negatives for hypoglycemia include no headaches, nervousness/anxiousness or tremors. Pertinent negatives for diabetes include no chest pain, no fatigue, no polydipsia and no polyuria. Pertinent negatives for diabetic complications include no CVA. Current diabetic treatment includes oral agent (monotherapy) (metformin). She is compliant with treatment all of the time. Her weight is stable. Her overall blood glucose range is 110-130 mg/dl.  Hypertension This is a chronic problem. The problem is controlled. Pertinent negatives include no chest pain, headaches, palpitations or shortness of breath. Past treatments include angiotensin blockers, diuretics and calcium channel blockers. There is no history of kidney disease, CAD/MI or CVA.   Lab Results  Component Value Date   CREATININE 1.04 (H) 12/31/2020   BUN 15 12/31/2020   NA 133 (L) 12/31/2020   K 3.6 12/31/2020   CL 101 12/31/2020   CO2 25 12/31/2020   Lab Results  Component Value Date   CHOL 159 07/24/2020   HDL 37 (L) 07/24/2020   LDLCALC 62 07/24/2020   TRIG 301 (H) 07/24/2020   CHOLHDL 4.3 07/24/2020   Lab Results  Component Value Date   TSH 1.765 04/11/2018   Lab Results  Component Value Date   HGBA1C 6.0 (A) 11/03/2020   Lab Results  Component Value Date   WBC 7.6 12/31/2020   HGB 13.1 12/31/2020   HCT 39.5 12/31/2020   MCV 83.2 12/31/2020   PLT 264 12/31/2020   Lab Results  Component Value Date   ALT 21 12/31/2020   AST 20 12/31/2020   ALKPHOS 85 12/31/2020   BILITOT 0.5 12/31/2020     Review of Systems  Constitutional:  Negative for appetite change, fatigue, fever and unexpected weight change.   HENT:  Negative for trouble swallowing.   Eyes:  Negative for visual disturbance.  Respiratory:  Negative for cough, chest tightness and shortness of breath.   Cardiovascular:  Negative for chest pain, palpitations and leg swelling.  Gastrointestinal:  Negative for abdominal pain.  Endocrine: Negative for polydipsia and polyuria.  Genitourinary:  Negative for dysuria and vaginal bleeding.  Musculoskeletal:  Negative for arthralgias.  Neurological:  Negative for tremors, numbness and headaches.  Psychiatric/Behavioral:  Negative for dysphoric mood and sleep disturbance. The patient is not nervous/anxious.    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   Lumbosacral spondylosis without myelopathy 11/22/2018   Vertigo 10/09/2018   Acute midline low back pain without sciatica 10/09/2018   Hyperlipidemia associated with type 2 diabetes mellitus ([place]) 04/11/2018   Gastroparesis 04/11/2018   Long  term (current) use of aromatase inhibitors 12/25/2017   Arthritis    Elevated LFTs    Essential hypertension    Positive PPD, treated    Umbilical hernia without obstruction and without gangrene 07/13/2016   Rectal polyp    Irritable bowel syndrome with both constipation and diarrhea 07/20/2015   Type II diabetes mellitus with complication ([place]) 29/56/2130   Pre-ulcerative calluses 06/22/2015   Neuropathy  06/22/2015   Hot flash, menopausal 04/09/2015   Atherosclerotic peripheral vascular disease with intermittent claudication ([place]) 03/25/2015   Osteoporosis of lumbar spine 03/12/2015   Malignant neoplasm of left breast in female, estrogen receptor positive ([place]) 05/30/2014   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    No Known Allergies  Past Surgical History:  Procedure Laterality Date   BREAST BIOPSY Left 07/31/2014   [facility] and DCIS    BREAST BIOPSY Left 03/08/2016   INTRADUCTAL PAPILLOMA    BREAST LUMPECTOMY Left 07/2014   [facility], DCIS, clear margins, positive LN   BREAST LUMPECTOMY Left 04/05/2016   excision of intraductal papilloma, no malignancy or atypia   BREAST LUMPECTOMY WITH NEEDLE LOCALIZATION Left 04/05/2016   Procedure: BREAST LUMPECTOMY WITH NEEDLE LOCALIZATION;  Surgeon: Hubbard Robinson, MD;  Location: [facility];  Service: General;  Laterality: Left;   BREAST LUMPECTOMY WITH NEEDLE LOCALIZATION AND AXILLARY SENTINEL LYMPH NODE BX Left 08/27/14   CHOLECYSTECTOMY  09/07/2013   COLONOSCOPY WITH PROPOFOL N/A 08/28/2015   Procedure: COLONOSCOPY WITH PROPOFOL;  Surgeon: Lucilla Lame, MD;  Location: [place];  Service: Endoscopy;  Laterality: N/A;  Diabetic oral   COLONOSCOPY WITH PROPOFOL N/A 08/14/2020   Procedure: COLONOSCOPY WITH PROPOFOL;  Surgeon: Virgel Manifold, MD;  Location: [facility];  Service: Endoscopy;  Laterality: N/A;   ENDOSCOPIC MUCOSAL RESECTION N/A 09/10/2020   Procedure: ENDOSCOPIC MUCOSAL RESECTION;  Surgeon: Rush Landmark Telford Nab., MD;  Location: [place];  Service: Gastroenterology;  Laterality: N/A;   ESOPHAGOGASTRODUODENOSCOPY  2014   gastritis   ESOPHAGOGASTRODUODENOSCOPY (EGD) WITH PROPOFOL N/A 04/16/2019   Procedure: ESOPHAGOGASTRODUODENOSCOPY (EGD) WITH PROPOFOL;  Surgeon: Virgel Manifold, MD;  Location: [facility];  Service: Endoscopy;  Laterality: N/A;   ESOPHAGOGASTRODUODENOSCOPY (EGD) WITH PROPOFOL N/A 10/03/2019    Procedure: ESOPHAGOGASTRODUODENOSCOPY (EGD) WITH PROPOFOL;  Surgeon: Virgel Manifold, MD;  Location: [facility];  Service: Endoscopy;  Laterality: N/A;   EUS N/A 09/10/2020   Procedure: LOWER ENDOSCOPIC ULTRASOUND (EUS);  Surgeon: Irving Copas., MD;  Location: [place];  Service: Gastroenterology;  Laterality: N/A;   FLEXIBLE SIGMOIDOSCOPY N/A 08/27/2020   Procedure: FLEXIBLE SIGMOIDOSCOPY;  Surgeon: Virgel Manifold, MD;  Location: [facility];  Service: Endoscopy;  Laterality: N/A;   FLEXIBLE SIGMOIDOSCOPY N/A 09/10/2020   Procedure: FLEXIBLE SIGMOIDOSCOPY;  Surgeon: Rush Landmark Telford Nab., MD;  Location: [place];  Service: Gastroenterology;  Laterality: N/A;   FOREIGN BODY REMOVAL  09/10/2020   Procedure: FOREIGN BODY REMOVAL ;  Surgeon: Rush Landmark Telford Nab., MD;  Location: [place];  Service: Gastroenterology;;   HEMOSTASIS CLIP PLACEMENT  09/10/2020   Procedure: HEMOSTASIS CLIP PLACEMENT;  Surgeon: Irving Copas., MD;  Location: [place];  Service: Gastroenterology;;   POLYPECTOMY  08/28/2015   Procedure: POLYPECTOMY INTESTINAL;  Surgeon: Lucilla Lame, MD;  Location: [place];  Service: Endoscopy;;  Rectal polyp   SUBMUCOSAL LIFTING INJECTION  09/10/2020   Procedure: SUBMUCOSAL LIFTING INJECTION;  Surgeon: Irving Copas., MD;  Location: [place];  Service: Gastroenterology;;   TUBAL LIGATION Bilateral    UMBILICAL HERNIA REPAIR N/A 08/09/2016  Procedure: HERNIA REPAIR UMBILICAL ADULT;  Surgeon: Olean Ree, MD;  Location: [facility];  Service: General;  Laterality: N/A;   VENTRAL HERNIA REPAIR N/A 04/10/2015   Procedure: HERNIA REPAIR VENTRAL ADULT;  Surgeon: Leonie Green, MD;  Location: [facility];  Service: General;  Laterality: N/A;    Social History   Tobacco Use   Smoking status: Every Day    Packs/day: 0.50    Years: 44.00    Pack years: 22.00    Types: Cigarettes   Smokeless tobacco: Never  Vaping  Use   Vaping Use: Never used  Substance Use Topics   Alcohol use: Yes    Alcohol/week: 2.0 standard drinks    Types: 2 Cans of beer per week    Comment: weekends - beer    Drug use: No     Medication list has been reviewed and updated.  Current Meds  Medication Sig   acetaminophen (TYLENOL) 325 MG tablet Take 650 mg by mouth daily as needed for moderate pain.   Alcohol Swabs (B-D SINGLE USE SWABS REGULAR) PADS 1 each by Does not apply route 2 (two) times daily.   amLODipine (NORVASC) 10 MG tablet TAKE TWO TABLETS BY MOUTH ONCE DAILY   aspirin EC 81 MG tablet Take 81 mg by mouth daily.   atorvastatin (LIPITOR) 10 MG tablet Take 10 mg by mouth at bedtime.   Blood Glucose Calibration (TRUE METRIX LEVEL 1) Low SOLN 1 each by In Vitro route daily as needed.   Blood Glucose Monitoring Suppl (TRUE METRIX METER) DEVI 1 each by Does not apply route daily.   calcium-vitamin D (OSCAL WITH D) 500-200 MG-UNIT TABS tablet Take 1 tablet by mouth daily.   gabapentin (NEURONTIN) 300 MG capsule Take by mouth.   glucose blood (PRECISION QID TEST) test strip    hydrochlorothiazide (HYDRODIURIL) 25 MG tablet Take 1 tablet (25 mg total) by mouth daily.   irbesartan (AVAPRO) 300 MG tablet TAKE ONE TABLET BY MOUTH ONCE DAILY   metFORMIN (GLUCOPHAGE-XR) 500 MG 24 hr tablet TAKE ONE TABLET BY MOUTH EVERY MORNING WITH BREAKFAST   traMADol (ULTRAM) 50 MG tablet Take by mouth.    PHQ 2/9 Scores 11/03/2020 10/12/2020 07/24/2020 07/13/2020  PHQ - 2 Score 0 0 0 0  PHQ- 9 Score 4 0 0 -    GAD 7 : Generalized Anxiety Score 11/03/2020 10/12/2020 07/24/2020 10/01/2019  Nervous, Anxious, on Edge 1 0 0 0  Control/stop worrying 0 0 0 0  Worry too much - different things 0 0 0 0  Trouble relaxing 1 0 0 0  Restless 0 0 0 0  Easily annoyed or irritable 0 0 0 0  Afraid - awful might happen 0 0 0 0  Total GAD 7 Score 2 0 0 0  Anxiety Difficulty Not difficult at all Not difficult at all - Not difficult at all    BP Readings  from Last 3 Encounters:  03/05/21 118/68  02/23/21 128/72  12/31/20 127/67    Physical Exam Vitals and nursing note reviewed.  Constitutional:      General: She is not in acute distress.    Appearance: She is well-developed.  HENT:     Head: Normocephalic and atraumatic.  Cardiovascular:     Rate and Rhythm: Normal rate and regular rhythm.     Pulses: Normal pulses.  Pulmonary:     Effort: Pulmonary effort is normal. No respiratory distress.     Breath sounds: No wheezing or rhonchi.  Musculoskeletal:     Cervical back: Normal range of motion.     Right lower leg: No edema.     Left lower leg: No edema.  Lymphadenopathy:     Cervical: No cervical adenopathy.  Skin:    General: Skin is warm and dry.     Capillary Refill: Capillary refill takes less than 2 seconds.     Findings: No rash.  Neurological:     General: No focal deficit present.     Mental Status: She is alert and oriented to person, place, and time.  Psychiatric:        Mood and Affect: Mood normal.        Behavior: Behavior normal.    Wt Readings from Last 3 Encounters:  03/05/21 198 lb (89.8 kg)  02/23/21 201 lb (91.2 kg)  02/16/21 193 lb (87.5 kg)    BP 118/68   Pulse 80   Temp 98.4 F (36.9 C) (Oral)   Ht 5\' 8"  (1.727 m)   Wt 198 lb (89.8 kg)   SpO2 96%   BMI 30.11 kg/m   Assessment and Plan: 1. Type II diabetes mellitus with complication (HCC) Clinically stable by exam and report without s/s of hypoglycemia. DM complicated by hypertension and dyslipidemia. Tolerating medications well without side effects or other concerns. - POCT glycosylated hemoglobin (Hb A1C) = 6.0  2. Essential hypertension Clinically stable exam with well controlled BP. Tolerating medications without side effects at this time. Pt to continue current regimen and low sodium diet; benefits of regular exercise as able discussed.  3. Smoking history She continues to smoke but is trying to cut back Recent LDCT - no  malignancy, +atherosclerosis of aorta On lipitor  4. Need for immunization against influenza - Flu Vaccine QUAD 59mo+IM (Fluarix, Fluzone & Alfiuria Quad PF)   Partially dictated using Editor, commissioning. Any errors are unintentional.  Halina Maidens, MD [facility]  03/05/2021

## 2021-03-17 ENCOUNTER — Encounter
Admission: RE | Admit: 2021-03-17 | Discharge: 2021-03-17 | Disposition: A | Discharge status: Home / Self Care | Payer: Medicare Other | Source: Ambulatory Visit | Attending: Obstetrics and Gynecology | Admitting: Obstetrics and Gynecology

## 2021-03-17 ENCOUNTER — Other Ambulatory Visit: Payer: Self-pay

## 2021-03-17 ENCOUNTER — Encounter: Payer: Self-pay | Admitting: Obstetrics and Gynecology

## 2021-03-17 HISTORY — DX: Atherosclerotic heart disease of native coronary artery without angina pectoris: I25.10

## 2021-03-17 NOTE — Progress Notes (Signed)
Perioperative Services  Pre-Admission/Anesthesia Testing Clinical Review  Date: 03/18/21  Patient Demographics:  Name: Donna Riley DOB:   03/08/1961 MRN:   [id number]  Planned Surgical Procedure(s):    Case: [id number] Date/Time: 03/25/21 1202   Procedure: DILATATION AND CURETTAGE /HYSTEROSCOPY   Anesthesia type: Choice   Pre-op diagnosis: Endometrial polyp   Location: [facility] OR ROOM 05 / [place] [facility] FOR ANESTHESIA GROUP   Surgeons: Malachy Mood, MD   NOTE: Available PAT nursing documentation and vital signs have been reviewed. Clinical nursing staff has updated patient's PMH/PSHx, current medication list, and drug allergies/intolerances to ensure comprehensive history available to assist in medical decision making as it pertains to the aforementioned surgical procedure and anticipated anesthetic course. Extensive review of available clinical information performed. [facility] and PSHx updated with any diagnoses/procedures that  may have been inadvertently omitted during her intake with the pre-admission testing department's nursing staff.  Clinical Discussion:  Donna Riley is a 60 y.o. female who is submitted for pre-surgical anesthesia review and clearance prior to her undergoing the above procedure. Patient is a Current Smoker (22 pack years). Pertinent PMH includes: CAD, valvular regurgitation, diastolic dysfunction, HTN, HLD, T2DM, DOE, GERD (no daily Tx), neuroendocrine tumor, breast cancer, endometrial polyp, OA.  Patient is followed by cardiology Nehemiah Massed, MD). She was last seen in the cardiology clinic on 12/03/2020; notes reviewed. At the time of her clinic visit, the patient denied any chest pain, shortness of breath, PND, orthopnea, palpitations, significant peripheral edema, vertiginous symptoms, or presyncope/syncope.  Patient with a PMH significant for cardiovascular diagnoses.  TTE performed on 10/29/2020 revealed normal left ventricular systolic function with mild  LVH; LVEF >55%.  There was trivial to mild pan valvular regurgitation.  No evidence of valvular stenosis.  Myocardial perfusion imaging study performed on 11/23/2020 revealed an LVEF of 51%.  There were no regional wall motion abnormalities.  There was no evidence of stress-induced myocardial ischemia or arrhythmia.  Blood pressure reasonably controlled at 142/72 on currently prescribed CCB, diuretic, and ARB therapies.  Patient is on a statin for HLD.  T2DM well-controlled on currently prescribed regimen; last Hgb A1c was 6.0% when checked on 03/05/2021. Functional capacity, as defined by DASI, is documented as being >/= 4 METS.  No changes were made to her medication regimen.  Patient to follow-up with outpatient cardiology in 9 months or sooner if needed.  Donna Riley is scheduled for an DILATATION AND CURETTAGE /HYSTEROSCOPY on 03/25/2021 with Dr. Elenore Paddy, MD.  Given patient's past medical history significant for cardiovascular diagnoses, presurgical cardiac clearance was sought by the PAT team. Per cardiology, "this patient is optimized for surgery and may proceed with the planned procedural course with a LOW risk of significant perioperative cardiovascular complications".  This patient is on daily antiplatelet therapy. She has been instructed on recommendations for holding her daily low-dose ASA for 4 days prior to her procedure with plans to restart as soon as postoperative bleeding risk felt to be minimized by her attending surgeon. The patient has been instructed that her last dose of her anticoagulant will be on 03/20/2021.  Patient denies previous perioperative complications with anesthesia in the past. In review of the available records, it is noted that patient underwent a MAC anesthetic course at [facility] (ASA III) in 08/2020 without documented complications.   Vitals with BMI 03/05/2021 02/23/2021 02/16/2021  Height _0  _1  _2   Weight 198 lbs 201 lbs 193 lbs   BMI 30.11  75.64 33.29  Systolic 518 841 -  Diastolic 68 72 -  Pulse 80 - -    Providers/Specialists:   NOTE: Primary physician provider listed below. Patient may have been seen by APP or partner within same practice.   PROVIDER ROLE / SPECIALTY LAST Beatris Ship, MD OB/GYN (SURGEON) 02/23/2021  Glean Hess, MD PRIMARY CARE PROVIDER 03/05/2021  Serafina Royals, MD CARDIOLOGY 12/03/2020  Earlie Server, MD ONCOLOGY 12/31/2020   Allergies:  Patient has no known allergies.  Current Home Medications:   No current facility-administered medications for this encounter.    acetaminophen (TYLENOL) 325 MG tablet   amLODipine (NORVASC) 10 MG tablet   aspirin EC 81 MG tablet   atorvastatin (LIPITOR) 10 MG tablet   calcium-vitamin D (OSCAL WITH D) 500-200 MG-UNIT TABS tablet   hydrochlorothiazide (HYDRODIURIL) 25 MG tablet   irbesartan (AVAPRO) 300 MG tablet   metFORMIN (GLUCOPHAGE-XR) 500 MG 24 hr tablet   Multiple Vitamins-Minerals (MULTIVITAMIN WITH MINERALS) tablet   Alcohol Swabs (B-D SINGLE USE SWABS REGULAR) PADS   Blood Glucose Calibration (TRUE METRIX LEVEL 1) Low SOLN   Blood Glucose Monitoring Suppl (TRUE METRIX METER) DEVI   glucose blood (PRECISION QID TEST) test strip   History:   Past Medical History:  Diagnosis Date   Arthritis    shoulders and neck   Breast cancer, left ([place]) 08/04/2014   a.) Bx (+) for invasive mammary carcinoma with DCIS; ER/PR (+), HER2/neu (-). (+) lymphovascular invasion with 1 of 2 lymph nodes being (+). Treated with lumpectomy + adjuvant chemoradiation.   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   Diastolic dysfunction 66/10/3014   a.)  TTE 10/29/2020: EF >55%; G1DD   DOE (dyspnea on exertion)    Elevated LFTs    Endometrial polyp  02/03/2021   a.) Pelvic US --> 18 x 5 x 10 mm lenticular nodule in endrometrial canal; favored endometrial polyp vs. neoplasm. b.) Bx 02/23/2021 --> no dysplasia or malignancy; benign.   Fatigue 08/01/2016   GERD (gastroesophageal reflux disease)    occ-no meds   Gout    Hydradenitis 01/23/2015   Hyperlipidemia    Hypertension    Mastitis 10/15/2016   Neuroendocrine tumor 08/14/2020   a.) Rectal mass Bx (+) for well differentiated NET (grade I). IHC for cytokeratin AE1/AE3 and CD56 supports.   Positive PPD, treated    T2DM (type 2 diabetes mellitus) ([place]) 06/2014   Valvular regurgitation 10/29/2020   a.) TTE 10/29/2020: EF >55%; trivial AR/TR/PR, mild MR   Past Surgical History:  Procedure Laterality Date   BREAST BIOPSY Left 07/31/2014   [facility] and DCIS    BREAST BIOPSY Left 03/08/2016   INTRADUCTAL PAPILLOMA    BREAST LUMPECTOMY Left 07/2014   [facility], DCIS, clear margins, positive LN   BREAST LUMPECTOMY Left 04/05/2016   excision of intraductal papilloma, no malignancy or atypia   BREAST LUMPECTOMY WITH NEEDLE LOCALIZATION Left 04/05/2016   Procedure: BREAST LUMPECTOMY WITH NEEDLE LOCALIZATION;  Surgeon: Hubbard Robinson, MD;  Location: [facility] ORS;  Service: General;  Laterality: Left;   BREAST LUMPECTOMY WITH NEEDLE LOCALIZATION AND AXILLARY SENTINEL LYMPH NODE BX Left 08/27/14   CHOLECYSTECTOMY  09/07/2013   COLONOSCOPY WITH PROPOFOL N/A 08/28/2015   Procedure: COLONOSCOPY WITH PROPOFOL;  Surgeon: Evangeline Gula  Allen Norris, MD;  Location: [place];  Service: Endoscopy;  Laterality: N/A;  Diabetic oral   COLONOSCOPY WITH PROPOFOL N/A 08/14/2020   Procedure: COLONOSCOPY WITH PROPOFOL;  Surgeon: Virgel Manifold, MD;  Location: [facility];  Service: Endoscopy;  Laterality: N/A;   ENDOSCOPIC MUCOSAL RESECTION N/A 09/10/2020   Procedure: ENDOSCOPIC MUCOSAL RESECTION;  Surgeon: Rush Landmark Telford Nab., MD;  Location: [place];  Service: Gastroenterology;  Laterality: N/A;    ESOPHAGOGASTRODUODENOSCOPY  2014   gastritis   ESOPHAGOGASTRODUODENOSCOPY (EGD) WITH PROPOFOL N/A 04/16/2019   Procedure: ESOPHAGOGASTRODUODENOSCOPY (EGD) WITH PROPOFOL;  Surgeon: Virgel Manifold, MD;  Location: [facility] END[facility];  Service: Endoscopy;  Laterality: N/A;   ESOPHAGOGASTRODUODENOSCOPY (EGD) WITH PROPOFOL N/A 10/03/2019   Procedure: ESOPHAGOGASTRODUODENOSCOPY (EGD) WITH PROPOFOL;  Surgeon: Virgel Manifold, MD;  Location: [facility];  Service: Endoscopy;  Laterality: N/A;   EUS N/A 09/10/2020   Procedure: LOWER ENDOSCOPIC ULTRASOUND (EUS);  Surgeon: Irving Copas., MD;  Location: [place];  Service: Gastroenterology;  Laterality: N/A;   FLEXIBLE SIGMOIDOSCOPY N/A 08/27/2020   Procedure: FLEXIBLE SIGMOIDOSCOPY;  Surgeon: Virgel Manifold, MD;  Location: [facility];  Service: Endoscopy;  Laterality: N/A;   FLEXIBLE SIGMOIDOSCOPY N/A 09/10/2020   Procedure: FLEXIBLE SIGMOIDOSCOPY;  Surgeon: Rush Landmark Telford Nab., MD;  Location: [place];  Service: Gastroenterology;  Laterality: N/A;   FOREIGN BODY REMOVAL  09/10/2020   Procedure: FOREIGN BODY REMOVAL ;  Surgeon: Rush Landmark Telford Nab., MD;  Location: [place];  Service: Gastroenterology;;   HEMOSTASIS CLIP PLACEMENT  09/10/2020   Procedure: HEMOSTASIS CLIP PLACEMENT;  Surgeon: Irving Copas., MD;  Location: [place];  Service: Gastroenterology;;   POLYPECTOMY  08/28/2015   Procedure: POLYPECTOMY INTESTINAL;  Surgeon: Lucilla Lame, MD;  Location: [place];  Service: Endoscopy;;  Rectal polyp   SUBMUCOSAL LIFTING INJECTION  09/10/2020   Procedure: SUBMUCOSAL LIFTING INJECTION;  Surgeon: Irving Copas., MD;  Location: [place];  Service: Gastroenterology;;   TUBAL LIGATION Bilateral    UMBILICAL HERNIA REPAIR N/A 08/09/2016   Procedure: HERNIA REPAIR UMBILICAL ADULT;  Surgeon: Olean Ree, MD;  Location: [facility]S;  Service: General;  Laterality: N/A;   VENTRAL HERNIA  REPAIR N/A 04/10/2015   Procedure: HERNIA REPAIR VENTRAL ADULT;  Surgeon: Leonie Green, MD;  Location: [facility] ORS;  Service: General;  Laterality: N/A;   Family History  Problem Relation Age of Onset   Cancer Mother        Pancreatic   Cancer Father        Throat   Healthy Sister    Cancer Sister        liver   HIV Brother    Cancer Maternal Grandmother        Breast   Breast cancer Maternal Grandmother 75   Social History   Tobacco Use   Smoking status: Every Day    Packs/day: 0.50    Years: 44.00    Pack years: 22.00    Types: Cigarettes   Smokeless tobacco: Never  Vaping Use   Vaping Use: Never used  Substance Use Topics   Alcohol use: Yes    Alcohol/week: 2.0 standard drinks    Types: 2 Cans of beer per week    Comment: weekends - beer    Drug use: No    Pertinent Clinical Results:  LABS: Labs reviewed: Acceptable for surgery.  Lab Results  Component Value Date   WBC 7.6 12/31/2020   HGB 13.1 12/31/2020   HCT 39.5 12/31/2020   MCV  83.2 12/31/2020   PLT 264 12/31/2020   Lab Results  Component Value Date   NA 133 (L) 12/31/2020   K 3.6 12/31/2020   CO2 25 12/31/2020   GLUCOSE 124 (H) 12/31/2020   BUN 15 12/31/2020   CREATININE 1.04 (H) 12/31/2020   CALCIUM 8.9 12/31/2020   GFRNONAA >60 12/31/2020   Lab Results  Component Value Date   HGBA1C 6.0 (A) 03/05/2021    ECG: Date: 08/13/2020 Rate: 65 bpm Rhythm: normal sinus Axis (leads I and aVF): Normal Intervals: PR 164 ms. QRS 80 ms. QTc 420 ms. ST segment and T wave changes: Nonspecific T wave abnormalities  Comparison: Similar to previous tracing obtained on 02/12/2016 NOTE: Tracing obtained at [facility]; unable for review. Above based on cardiologist's interpretation.    IMAGING / PROCEDURES: LEXISCAN performed on 11/23/2020 LVEF 51% Normal myocardial thickening and wall motion No artifacts noted Left ventricular cavity size normal No evidence of stress-induced myocardial  ischemia or arrhythmia  TRANSTHORACIC ECHOCARDIOGRAM performed on 10/29/2020 LVEF >55% Normal left ventricular systolic function with mild LVH Normal right ventricular systolic function Grade 1 diastolic dysfunction Trivial AR, TR, and PR Mild MR No valvular stenosis No evidence of pericardial effusion  Impression and Plan:  Donna Riley has been referred for pre-anesthesia review and clearance prior to her undergoing the planned anesthetic and procedural courses. Available labs, pertinent testing, and imaging results were personally reviewed by me. This patient has been appropriately cleared by cardiology with an overall LOW risk of significant perioperative cardiovascular complications.  Based on clinical review performed today (03/18/21), barring any significant acute changes in the patient's overall condition, it is anticipated that she will be able to proceed with the planned surgical intervention. Any acute changes in clinical condition may necessitate her procedure being postponed and/or cancelled. Patient will meet with anesthesia team (MD and/or CRNA) on the day of her procedure for preoperative evaluation/assessment. Questions regarding anesthetic course will be fielded at that time.   Pre-surgical instructions were reviewed with the patient during her PAT appointment and questions were fielded by PAT clinical staff. Patient was advised that if any questions or concerns arise prior to her procedure then she should return a call to PAT and/or her surgeon's office to discuss.  Honor Loh, MSN, APRN, FNP-C, CEN [facility]  Peri-operative Services Nurse Practitioner Phone: [phone] Fax: [phone] 03/18/21 8:48 AM  NOTE: This note has been prepared using Dragon dictation software. Despite my best ability to proofread, there is always the potential that unintentional transcriptional errors may still occur from this process.

## 2021-03-17 NOTE — Patient Instructions (Signed)
Your procedure is scheduled on:03-25-21 Thursday Report to the Registration Desk on the 1st floor of the [facility].Then proceed to the 2nd floor Surgery Desk in the [place] To find out your arrival time, please call [phone] between 1PM - 3PM on:03-24-21 Wednesday  REMEMBER: Instructions that are not followed completely may result in serious medical risk, up to and including death; or upon the discretion of your surgeon and anesthesiologist your surgery may need to be rescheduled.  Do not eat food after midnight the night before surgery.  No gum chewing, lozengers or hard candies.  You may however, drink Water up to 2 hours before you are scheduled to arrive for your surgery. Do not drink anything within 2 hours of your scheduled arrival time.  Type 1 and Type 2 diabetics should only drink water.  TAKE THESE MEDICATIONS THE MORNING OF SURGERY WITH A SIP OF WATER: -amLODipine (NORVASC) 10 MG tablet -atorvastatin (LIPITOR) 10 MG tablet  Stop your metFORMIN (GLUCOPHAGE-XR) 500 MG 24 hr tablet 2 days prior to surgery-Last dose on 03-22-21 Monday  Ask Dr Georgianne Fick when you see him in the office on 03-18-21 about when you need to stop your 81 mg Aspirin  One week prior to surgery: Stop Anti-inflammatories (NSAIDS) such as Advil, Aleve, Ibuprofen, Motrin, Naproxen, Naprosyn and Aspirin based products such as Excedrin, Goodys Powder, BC Powder.You may however, continue to take Tylenol if needed for pain up until the day of surgery.  Stop ANY OVER THE COUNTER supplements/vitamins NOW (03-17-21) until after surgery (Multiple Vitamins-Minerals (MULTIVITAMIN WITH MINERALS) tablet and calcium-vitamin D (OSCAL WITH D) 500-200 MG-UNIT TABS tablet)  No Alcohol for 24 hours before or after surgery.  No Smoking including e-cigarettes for 24 hours prior to surgery.  No chewable tobacco products for at least 6 hours prior to surgery.  No nicotine patches on the day of surgery.  Do not use  any "recreational" drugs for at least a week prior to your surgery.  Please be advised that the combination of cocaine and anesthesia may have negative outcomes, up to and including death. If you test positive for cocaine, your surgery will be cancelled.  On the morning of surgery brush your teeth with toothpaste and water, you may rinse your mouth with mouthwash if you wish. Do not swallow any toothpaste or mouthwash.  Do not wear jewelry, make-up, hairpins, clips or nail polish.  Do not wear lotions, powders, or perfumes.   Do not shave body from the neck down 48 hours prior to surgery just in case you cut yourself which could leave a site for infection.  Also, freshly shaved skin may become irritated if using the CHG soap.  Contact lenses, hearing aids and dentures may not be worn into surgery.  Do not bring valuables to the hospital. [facility] is not responsible for any missing/lost belongings or valuables.  Notify your doctor if there is any change in your medical condition (cold, fever, infection).  Wear comfortable clothing (specific to your surgery type) to the hospital.  After surgery, you can help prevent lung complications by doing breathing exercises.  Take deep breaths and cough every 1-2 hours. Your doctor may order a device called an Incentive Spirometer to help you take deep breaths. When coughing or sneezing, hold a pillow firmly against your incision with both hands. This is called "splinting." Doing this helps protect your incision. It also decreases belly discomfort.  If you are being admitted to the hospital overnight, leave your suitcase  in the car. After surgery it may be brought to your room.  If you are being discharged the day of surgery, you will not be allowed to drive home. You will need a responsible adult (18 years or older) to drive you home and stay with you that night.   If you are taking public transportation, you will need to have a responsible  adult (18 years or older) with you. Please confirm with your physician that it is acceptable to use public transportation.   Please call the [place] Dept. at [phone] if you have any questions about these instructions.  Surgery Visitation Policy:  Patients undergoing a surgery or procedure may have one family member or support person with them as long as that person is not COVID-19 positive or experiencing its symptoms.  That person may remain in the waiting area during the procedure and may rotate out with other people.  Inpatient Visitation:    Visiting hours are 7 a.m. to 8 p.m. Up to two visitors ages 16+ are allowed at one time in a patient room. The visitors may rotate out with other people during the day. Visitors must check out when they leave, or other visitors will not be allowed. One designated support person may remain overnight. The visitor must pass COVID-19 screenings, use hand sanitizer when entering and exiting the patient's room and wear a mask at all times, including in the patient's room. Patients must also wear a mask when staff or their visitor are in the room. Masking is required regardless of vaccination status.

## 2021-03-18 ENCOUNTER — Encounter: Payer: Self-pay | Admitting: Obstetrics and Gynecology

## 2021-03-18 ENCOUNTER — Ambulatory Visit (INDEPENDENT_AMBULATORY_CARE_PROVIDER_SITE_OTHER): Payer: Medicare Other | Admitting: Obstetrics and Gynecology

## 2021-03-18 VITALS — BP 132/76 | Ht 68.0 in | Wt 201.0 lb

## 2021-03-18 DIAGNOSIS — N840 Polyp of corpus uteri: Secondary | ICD-10-CM | POA: Diagnosis not present

## 2021-03-18 DIAGNOSIS — Z01818 Encounter for other preprocedural examination: Secondary | ICD-10-CM

## 2021-03-18 NOTE — H&P (View-Only) (Signed)
Obstetrics & Gynecology Surgery H&P    Chief Complaint: Scheduled Surgery   History of Present Illness: Patient is a 60 y.o. No obstetric history on file. presenting for scheduled hysteroscopy, D&C, for the treatment or further evaluation of endometrial polyp.   Prior Treatments prior to proceeding with surgery include: imaging and biospy  Preoperative Pap: 02/23/2021 Results:  NILM HPV negative   Preoperative Endometrial biopsy: 02/23/2021 Endocervical type polyp, otherwise benign Preoperative Ultrasound: 02/04/2019 Findings: 18 x 5 x 72mm endometrial polyp   Review of Systems:10 point review of systems  Past Medical History:  Patient Active Problem List   Diagnosis Date Noted   Smoking history 08/29/2020   Benign neuroendocrine tumor of sigmoid colon     2022: felt to be completely resected; rec repeat scope in 1 year and possible CT imgagin    History of rectal polyps    Nodule of colon    Polyp of ascending colon    Polyp of descending colon    Polyp of sigmoid colon    Sebaceous cyst 08/11/2020   Osteopenia of lumbar spine 07/19/2020   Breast pain, right 07/03/2020   Atherosclerosis of abdominal aorta ([place]) 02/06/2020    Seen on CT LD screening  Formatting of this note might be different from the original. By ct scan    Tobacco use disorder, continuous 37/85/8850   Helicobacter pylori gastritis 06/03/2019   Abdominal bloating    Nausea    Secondary and unspecified malignant neoplasm of axilla and upper limb lymph nodes ([place]) 02/18/2019   Lumbosacral spondylosis without myelopathy 11/22/2018   Vertigo 10/09/2018   Acute midline low back pain without sciatica 10/09/2018   Hyperlipidemia associated with type 2 diabetes mellitus ([place]) 04/11/2018    Lipitor 10 mg started 03/2018 - stopped due to muscle cramps    Gastroparesis 04/11/2018   Long term (current) use of aromatase inhibitors 12/25/2017   Arthritis     shoulders and neck    Elevated LFTs    Essential  hypertension    Positive PPD, treated     Treated as a teenager    Umbilical hernia without obstruction and without gangrene 07/13/2016   Rectal polyp    Irritable bowel syndrome with both constipation and diarrhea 07/20/2015   Type II diabetes mellitus with complication ([place]) 27/74/1287    Gastrparesis, HTN    Pre-ulcerative calluses 06/22/2015   Neuropathy 06/22/2015   Hot flash, menopausal 04/09/2015   Atherosclerotic peripheral vascular disease with intermittent claudication ([place]) 03/25/2015   Osteoporosis of lumbar spine 03/12/2015    2021 - Prolia recommended by Oncology - to start once dental issues are resolved    Malignant neoplasm of left breast in female, estrogen receptor positive ([place]) 05/30/2014    2016 -  Left breast with chemo + rad tx's with lumpectomy. On AI     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    Past Surgical History:  Past Surgical History:  Procedure Laterality Date   BREAST BIOPSY Left 07/31/2014   [facility] and DCIS    BREAST BIOPSY Left 03/08/2016   INTRADUCTAL PAPILLOMA    BREAST LUMPECTOMY Left 07/2014   [facility], DCIS, clear margins, positive LN   BREAST LUMPECTOMY Left 04/05/2016   excision of intraductal papilloma, no malignancy or atypia   BREAST LUMPECTOMY WITH NEEDLE LOCALIZATION Left 04/05/2016   Procedure: BREAST LUMPECTOMY WITH NEEDLE LOCALIZATION;  Surgeon: Hubbard Robinson, MD;  Location: [facility];  Service: General;  Laterality: Left;   BREAST LUMPECTOMY WITH  NEEDLE LOCALIZATION AND AXILLARY SENTINEL LYMPH NODE BX Left 08/27/14   CHOLECYSTECTOMY  09/07/2013   COLONOSCOPY WITH PROPOFOL N/A 08/28/2015   Procedure: COLONOSCOPY WITH PROPOFOL;  Surgeon: Lucilla Lame, MD;  Location: [place];  Service: Endoscopy;  Laterality: N/A;  Diabetic oral   COLONOSCOPY WITH PROPOFOL N/A 08/14/2020   Procedure: COLONOSCOPY WITH PROPOFOL;  Surgeon: Virgel Manifold, MD;  Location: [facility];  Service: Endoscopy;  Laterality: N/A;    ENDOSCOPIC MUCOSAL RESECTION N/A 09/10/2020   Procedure: ENDOSCOPIC MUCOSAL RESECTION;  Surgeon: Rush Landmark Telford Nab., MD;  Location: [place];  Service: Gastroenterology;  Laterality: N/A;   ESOPHAGOGASTRODUODENOSCOPY  2014   gastritis   ESOPHAGOGASTRODUODENOSCOPY (EGD) WITH PROPOFOL N/A 04/16/2019   Procedure: ESOPHAGOGASTRODUODENOSCOPY (EGD) WITH PROPOFOL;  Surgeon: Virgel Manifold, MD;  Location: [facility];  Service: Endoscopy;  Laterality: N/A;   ESOPHAGOGASTRODUODENOSCOPY (EGD) WITH PROPOFOL N/A 10/03/2019   Procedure: ESOPHAGOGASTRODUODENOSCOPY (EGD) WITH PROPOFOL;  Surgeon: Virgel Manifold, MD;  Location: [facility];  Service: Endoscopy;  Laterality: N/A;   EUS N/A 09/10/2020   Procedure: LOWER ENDOSCOPIC ULTRASOUND (EUS);  Surgeon: Irving Copas., MD;  Location: [place];  Service: Gastroenterology;  Laterality: N/A;   FLEXIBLE SIGMOIDOSCOPY N/A 08/27/2020   Procedure: FLEXIBLE SIGMOIDOSCOPY;  Surgeon: Virgel Manifold, MD;  Location: [facility];  Service: Endoscopy;  Laterality: N/A;   FLEXIBLE SIGMOIDOSCOPY N/A 09/10/2020   Procedure: FLEXIBLE SIGMOIDOSCOPY;  Surgeon: Rush Landmark Telford Nab., MD;  Location: [place];  Service: Gastroenterology;  Laterality: N/A;   FOREIGN BODY REMOVAL  09/10/2020   Procedure: FOREIGN BODY REMOVAL ;  Surgeon: Rush Landmark Telford Nab., MD;  Location: [place];  Service: Gastroenterology;;   HEMOSTASIS CLIP PLACEMENT  09/10/2020   Procedure: HEMOSTASIS CLIP PLACEMENT;  Surgeon: Irving Copas., MD;  Location: [place];  Service: Gastroenterology;;   POLYPECTOMY  08/28/2015   Procedure: POLYPECTOMY INTESTINAL;  Surgeon: Lucilla Lame, MD;  Location: [place];  Service: Endoscopy;;  Rectal polyp   SUBMUCOSAL LIFTING INJECTION  09/10/2020   Procedure: SUBMUCOSAL LIFTING INJECTION;  Surgeon: Irving Copas., MD;  Location: [place];  Service: Gastroenterology;;   TUBAL LIGATION  Bilateral    UMBILICAL HERNIA REPAIR N/A 08/09/2016   Procedure: HERNIA REPAIR UMBILICAL ADULT;  Surgeon: Olean Ree, MD;  Location: [facility];  Service: General;  Laterality: N/A;   VENTRAL HERNIA REPAIR N/A 04/10/2015   Procedure: HERNIA REPAIR VENTRAL ADULT;  Surgeon: Leonie Green, MD;  Location: [facility];  Service: General;  Laterality: N/A;    Family History:  Family History  Problem Relation Age of Onset   Cancer Mother        Pancreatic   Cancer Father        Throat   Healthy Sister    Cancer Sister        liver   HIV Brother    Cancer Maternal Grandmother        Breast   Breast cancer Maternal Grandmother 75    Social History:  Social History   Socioeconomic History   Marital status: Widowed    Spouse name: Not on file   Number of children: 4   Years of education: Not on file   Highest education level: 10th grade  Occupational History   Occupation: Disabled  Tobacco Use   Smoking status: Every Day    Packs/day: 0.50    Years: 44.00    Pack years: 22.00    Types: Cigarettes   Smokeless tobacco: Never  Vaping Use  Vaping Use: Never used  Substance and Sexual Activity   Alcohol use: Yes    Alcohol/week: 2.0 standard drinks    Types: 2 Cans of beer per week    Comment: weekends - beer    Drug use: No   Sexual activity: Not Currently  Other Topics Concern   Not on file  Social History Narrative   Pt lives alone, son lives 2 doors down.   Social Determinants of Health   Financial Resource Strain: Low Risk    Difficulty of Paying Living Expenses: Not very hard  Food Insecurity: No Food Insecurity   Worried About Charity fundraiser in the Last Year: Never true   Ran Out of Food in the Last Year: Never true  Transportation Needs: No Transportation Needs   Lack of Transportation (Medical): No   Lack of Transportation (Non-Medical): No  Physical Activity: Inactive   Days of Exercise per Week: 0 days   Minutes of Exercise per Session: 0 min   Stress: No Stress Concern Present   Feeling of Stress : Only a little  Social Connections: Moderately Isolated   Frequency of Communication with Friends and Family: More than three times a week   Frequency of Social Gatherings with Friends and Family: Three times a week   Attends Religious Services: More than 4 times per year   Active Member of Clubs or Organizations: No   Attends Archivist Meetings: Never   Marital Status: Widowed  Human resources officer Violence: Not At Risk   Fear of Current or Ex-Partner: No   Emotionally Abused: No   Physically Abused: No   Sexually Abused: No    Allergies:  No Known Allergies  Medications: Prior to Admission medications   Medication Sig Start Date End Date Taking? Authorizing Provider  amLODipine (NORVASC) 10 MG tablet TAKE TWO TABLETS BY MOUTH ONCE DAILY Patient taking differently: Take 10 mg by mouth every morning. 01/06/21  Yes Glean Hess, MD  aspirin EC 81 MG tablet Take 81 mg by mouth daily.   Yes [provider]  atorvastatin (LIPITOR) 10 MG tablet Take 10 mg by mouth every morning. 02/03/21  Yes [provider]  calcium-vitamin D (OSCAL WITH D) 500-200 MG-UNIT TABS tablet Take 1 tablet by mouth daily.   Yes [provider]  hydrochlorothiazide (HYDRODIURIL) 25 MG tablet Take 1 tablet (25 mg total) by mouth daily. Patient taking differently: Take 25 mg by mouth every morning. 11/03/20  Yes Glean Hess, MD  irbesartan (AVAPRO) 300 MG tablet TAKE ONE TABLET BY MOUTH ONCE DAILY Patient taking differently: Take 300 mg by mouth every morning. 01/09/21  Yes Glean Hess, MD  metFORMIN (GLUCOPHAGE-XR) 500 MG 24 hr tablet TAKE ONE TABLET BY MOUTH EVERY MORNING WITH BREAKFAST 11/03/20  Yes Glean Hess, MD  Multiple Vitamins-Minerals (MULTIVITAMIN WITH MINERALS) tablet Take 1 tablet by mouth daily.   Yes [provider]  acetaminophen (TYLENOL) 325 MG tablet Take 650 mg by mouth daily as  needed for moderate pain. 09/08/13   [provider]  Alcohol Swabs (B-D SINGLE USE SWABS REGULAR) PADS 1 each by Does not apply route 2 (two) times daily. 11/22/17   Glean Hess, MD  Blood Glucose Calibration (TRUE METRIX LEVEL 1) Low SOLN 1 each by In Vitro route daily as needed. 11/22/17   Glean Hess, MD  Blood Glucose Monitoring Suppl (TRUE METRIX METER) DEVI 1 each by Does not apply route daily. 12/15/17  Glean Hess, MD  glucose blood (PRECISION QID TEST) test strip  10/17/14   [provider]    Physical Exam Vitals: Blood pressure 132/76, height 5\' 8"  (1.727 m), weight 201 lb (91.2 kg). General: NAD HEENT: normocephalic, anicteric Pulmonary: No increased work of breathing, CTAB Cardiovascular: RRR, distal pulses 2+ Abdomen: soft, non-tender, non-distended Genitourinary: deferred Extremities: no edema, erythema, or tenderness Neurologic: Grossly intact Psychiatric: mood appropriate, affect full  Imaging No results found.  Assessment: 60 y.o. presenting for scheduled hysteroscopy D&C  Plan: 1)I have discussed with the patient the indications for the procedure. Included in the discussion were the options of therapy, as wall as their individual risks, benefits, and complications. Ample time was given to answer all questions.   In office pipelle biopsy generally provides comparable results to [facility], however this sampling modality may miss focal lesions if these were previously documented on ultrasound.  It is because of the potential to miss focal lesions that hysteroscopy D&C is also warranted in patient with continued postmenopausal bleeding that is not self limited regardless of prior in office biopsy results or ultrasound findings.  She understands that the risk of continued observation include worsening bleeding or worsening of any underlying pathology.  The choices include: 1. Doing nothing but following her symptoms 2. Attempts at hormonal  manipulation with either BCP or Depo-Provera for premenopausal patients with no concern for focal lesion or endometrial pathology 3. D&C/hysteroscopy. 4. Endometrial ablation via Novasure or other techniques for premenopausal patients with no concern for focal lesion or endometrial pathology  5. As final resort, hysterectomy. After consideration of her history and findings, mutual decision has been made to proceed with D+C/hysteroscopy. While the incidence is low, the risks from this surgery include, but are not limited to, the risks of anesthesia, hemorrhage, infection, perforation, and injury to adjacent structures including bowel, bladder and blood vessels.    2) Routine postoperative instructions were reviewed with the patient and her family in detail today including the expected length of recovery and likely postoperative course.  The patient concurred with the proposed plan, giving informed written consent for the surgery today.  Patient instructed on the importance of being NPO after midnight prior to her procedure.  If warranted preoperative prophylactic antibiotics and SCDs ordered on call to the OR to meet SCIP guidelines and adhere to recommendation laid forth in [place] Number 104 May 2009  "Antibiotic Prophylaxis for Gynecologic Procedures".     Malachy Mood, MD, [facility], [place] 03/18/2021, 11:53 AM

## 2021-03-18 NOTE — Progress Notes (Signed)
Obstetrics & Gynecology Surgery H&P    Chief Complaint: Scheduled Surgery   History of Present Illness: Patient is a 60 y.o. No obstetric history on file. presenting for scheduled hysteroscopy, D&C, for the treatment or further evaluation of endometrial polyp.   Prior Treatments prior to proceeding with surgery include: imaging and biospy  Preoperative Pap: 02/23/2021 Results:  NILM HPV negative   Preoperative Endometrial biopsy: 02/23/2021 Endocervical type polyp, otherwise benign Preoperative Ultrasound: 02/04/2019 Findings: 18 x 5 x 6mm endometrial polyp   Review of Systems:10 point review of systems  Past Medical History:  Patient Active Problem List   Diagnosis Date Noted   Smoking history 08/29/2020   Benign neuroendocrine tumor of sigmoid colon     2022: felt to be completely resected; rec repeat scope in 1 year and possible CT imgagin    History of rectal polyps    Nodule of colon    Polyp of ascending colon    Polyp of descending colon    Polyp of sigmoid colon    Sebaceous cyst 08/11/2020   Osteopenia of lumbar spine 07/19/2020   Breast pain, right 07/03/2020   Atherosclerosis of abdominal aorta ([place]) 02/06/2020    Seen on CT LD screening  Formatting of this note might be different from the original. By ct scan    Tobacco use disorder, continuous 92/03/9416   Helicobacter pylori gastritis 06/03/2019   Abdominal bloating    Nausea    Secondary and unspecified malignant neoplasm of axilla and upper limb lymph nodes ([place]) 02/18/2019   Lumbosacral spondylosis without myelopathy 11/22/2018   Vertigo 10/09/2018   Acute midline low back pain without sciatica 10/09/2018   Hyperlipidemia associated with type 2 diabetes mellitus ([place]) 04/11/2018    Lipitor 10 mg started 03/2018 - stopped due to muscle cramps    Gastroparesis 04/11/2018   Long term (current) use of aromatase inhibitors 12/25/2017   Arthritis     shoulders and neck    Elevated LFTs    Essential  hypertension    Positive PPD, treated     Treated as a teenager    Umbilical hernia without obstruction and without gangrene 07/13/2016   Rectal polyp    Irritable bowel syndrome with both constipation and diarrhea 07/20/2015   Type II diabetes mellitus with complication ([place]) 40/81/4481    Gastrparesis, HTN    Pre-ulcerative calluses 06/22/2015   Neuropathy 06/22/2015   Hot flash, menopausal 04/09/2015   Atherosclerotic peripheral vascular disease with intermittent claudication ([place]) 03/25/2015   Osteoporosis of lumbar spine 03/12/2015    2021 - Prolia recommended by Oncology - to start once dental issues are resolved    Malignant neoplasm of left breast in female, estrogen receptor positive ([place]) 05/30/2014    2016 -  Left breast with chemo + rad tx's with lumpectomy. On AI     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    Past Surgical History:  Past Surgical History:  Procedure Laterality Date   BREAST BIOPSY Left 07/31/2014   [facility] and DCIS    BREAST BIOPSY Left 03/08/2016   INTRADUCTAL PAPILLOMA    BREAST LUMPECTOMY Left 07/2014   [facility], DCIS, clear margins, positive LN   BREAST LUMPECTOMY Left 04/05/2016   excision of intraductal papilloma, no malignancy or atypia   BREAST LUMPECTOMY WITH NEEDLE LOCALIZATION Left 04/05/2016   Procedure: BREAST LUMPECTOMY WITH NEEDLE LOCALIZATION;  Surgeon: Hubbard Robinson, MD;  Location: [facility];  Service: General;  Laterality: Left;   BREAST LUMPECTOMY WITH  NEEDLE LOCALIZATION AND AXILLARY SENTINEL LYMPH NODE BX Left 08/27/14   CHOLECYSTECTOMY  09/07/2013   COLONOSCOPY WITH PROPOFOL N/A 08/28/2015   Procedure: COLONOSCOPY WITH PROPOFOL;  Surgeon: Lucilla Lame, MD;  Location: [place];  Service: Endoscopy;  Laterality: N/A;  Diabetic oral   COLONOSCOPY WITH PROPOFOL N/A 08/14/2020   Procedure: COLONOSCOPY WITH PROPOFOL;  Surgeon: Virgel Manifold, MD;  Location: [facility];  Service: Endoscopy;  Laterality: N/A;    ENDOSCOPIC MUCOSAL RESECTION N/A 09/10/2020   Procedure: ENDOSCOPIC MUCOSAL RESECTION;  Surgeon: Rush Landmark Telford Nab., MD;  Location: [place];  Service: Gastroenterology;  Laterality: N/A;   ESOPHAGOGASTRODUODENOSCOPY  2014   gastritis   ESOPHAGOGASTRODUODENOSCOPY (EGD) WITH PROPOFOL N/A 04/16/2019   Procedure: ESOPHAGOGASTRODUODENOSCOPY (EGD) WITH PROPOFOL;  Surgeon: Virgel Manifold, MD;  Location: [facility];  Service: Endoscopy;  Laterality: N/A;   ESOPHAGOGASTRODUODENOSCOPY (EGD) WITH PROPOFOL N/A 10/03/2019   Procedure: ESOPHAGOGASTRODUODENOSCOPY (EGD) WITH PROPOFOL;  Surgeon: Virgel Manifold, MD;  Location: [facility];  Service: Endoscopy;  Laterality: N/A;   EUS N/A 09/10/2020   Procedure: LOWER ENDOSCOPIC ULTRASOUND (EUS);  Surgeon: Irving Copas., MD;  Location: [place];  Service: Gastroenterology;  Laterality: N/A;   FLEXIBLE SIGMOIDOSCOPY N/A 08/27/2020   Procedure: FLEXIBLE SIGMOIDOSCOPY;  Surgeon: Virgel Manifold, MD;  Location: [facility];  Service: Endoscopy;  Laterality: N/A;   FLEXIBLE SIGMOIDOSCOPY N/A 09/10/2020   Procedure: FLEXIBLE SIGMOIDOSCOPY;  Surgeon: Rush Landmark Telford Nab., MD;  Location: [place];  Service: Gastroenterology;  Laterality: N/A;   FOREIGN BODY REMOVAL  09/10/2020   Procedure: FOREIGN BODY REMOVAL ;  Surgeon: Rush Landmark Telford Nab., MD;  Location: [place];  Service: Gastroenterology;;   HEMOSTASIS CLIP PLACEMENT  09/10/2020   Procedure: HEMOSTASIS CLIP PLACEMENT;  Surgeon: Irving Copas., MD;  Location: [place];  Service: Gastroenterology;;   POLYPECTOMY  08/28/2015   Procedure: POLYPECTOMY INTESTINAL;  Surgeon: Lucilla Lame, MD;  Location: [place];  Service: Endoscopy;;  Rectal polyp   SUBMUCOSAL LIFTING INJECTION  09/10/2020   Procedure: SUBMUCOSAL LIFTING INJECTION;  Surgeon: Irving Copas., MD;  Location: [place];  Service: Gastroenterology;;   TUBAL LIGATION  Bilateral    UMBILICAL HERNIA REPAIR N/A 08/09/2016   Procedure: HERNIA REPAIR UMBILICAL ADULT;  Surgeon: Olean Ree, MD;  Location: [facility];  Service: General;  Laterality: N/A;   VENTRAL HERNIA REPAIR N/A 04/10/2015   Procedure: HERNIA REPAIR VENTRAL ADULT;  Surgeon: Leonie Green, MD;  Location: [facility];  Service: General;  Laterality: N/A;    Family History:  Family History  Problem Relation Age of Onset   Cancer Mother        Pancreatic   Cancer Father        Throat   Healthy Sister    Cancer Sister        liver   HIV Brother    Cancer Maternal Grandmother        Breast   Breast cancer Maternal Grandmother 75    Social History:  Social History   Socioeconomic History   Marital status: Widowed    Spouse name: Not on file   Number of children: 4   Years of education: Not on file   Highest education level: 10th grade  Occupational History   Occupation: Disabled  Tobacco Use   Smoking status: Every Day    Packs/day: 0.50    Years: 44.00    Pack years: 22.00    Types: Cigarettes   Smokeless tobacco: Never  Vaping Use  Vaping Use: Never used  Substance and Sexual Activity   Alcohol use: Yes    Alcohol/week: 2.0 standard drinks    Types: 2 Cans of beer per week    Comment: weekends - beer    Drug use: No   Sexual activity: Not Currently  Other Topics Concern   Not on file  Social History Narrative   Pt lives alone, son lives 2 doors down.   Social Determinants of Health   Financial Resource Strain: Low Risk    Difficulty of Paying Living Expenses: Not very hard  Food Insecurity: No Food Insecurity   Worried About Charity fundraiser in the Last Year: Never true   Ran Out of Food in the Last Year: Never true  Transportation Needs: No Transportation Needs   Lack of Transportation (Medical): No   Lack of Transportation (Non-Medical): No  Physical Activity: Inactive   Days of Exercise per Week: 0 days   Minutes of Exercise per Session: 0 min   Stress: No Stress Concern Present   Feeling of Stress : Only a little  Social Connections: Moderately Isolated   Frequency of Communication with Friends and Family: More than three times a week   Frequency of Social Gatherings with Friends and Family: Three times a week   Attends Religious Services: More than 4 times per year   Active Member of Clubs or Organizations: No   Attends Archivist Meetings: Never   Marital Status: Widowed  Human resources officer Violence: Not At Risk   Fear of Current or Ex-Partner: No   Emotionally Abused: No   Physically Abused: No   Sexually Abused: No    Allergies:  No Known Allergies  Medications: Prior to Admission medications   Medication Sig Start Date End Date Taking? Authorizing Provider  amLODipine (NORVASC) 10 MG tablet TAKE TWO TABLETS BY MOUTH ONCE DAILY Patient taking differently: Take 10 mg by mouth every morning. 01/06/21  Yes Glean Hess, MD  aspirin EC 81 MG tablet Take 81 mg by mouth daily.   Yes [provider]  atorvastatin (LIPITOR) 10 MG tablet Take 10 mg by mouth every morning. 02/03/21  Yes [provider]  calcium-vitamin D (OSCAL WITH D) 500-200 MG-UNIT TABS tablet Take 1 tablet by mouth daily.   Yes [provider]  hydrochlorothiazide (HYDRODIURIL) 25 MG tablet Take 1 tablet (25 mg total) by mouth daily. Patient taking differently: Take 25 mg by mouth every morning. 11/03/20  Yes Glean Hess, MD  irbesartan (AVAPRO) 300 MG tablet TAKE ONE TABLET BY MOUTH ONCE DAILY Patient taking differently: Take 300 mg by mouth every morning. 01/09/21  Yes Glean Hess, MD  metFORMIN (GLUCOPHAGE-XR) 500 MG 24 hr tablet TAKE ONE TABLET BY MOUTH EVERY MORNING WITH BREAKFAST 11/03/20  Yes Glean Hess, MD  Multiple Vitamins-Minerals (MULTIVITAMIN WITH MINERALS) tablet Take 1 tablet by mouth daily.   Yes [provider]  acetaminophen (TYLENOL) 325 MG tablet Take 650 mg by mouth daily as  needed for moderate pain. 09/08/13   [provider]  Alcohol Swabs (B-D SINGLE USE SWABS REGULAR) PADS 1 each by Does not apply route 2 (two) times daily. 11/22/17   Glean Hess, MD  Blood Glucose Calibration (TRUE METRIX LEVEL 1) Low SOLN 1 each by In Vitro route daily as needed. 11/22/17   Glean Hess, MD  Blood Glucose Monitoring Suppl (TRUE METRIX METER) DEVI 1 each by Does not apply route daily. 12/15/17  Glean Hess, MD  glucose blood (PRECISION QID TEST) test strip  10/17/14   [provider]    Physical Exam Vitals: Blood pressure 132/76, height 5\' 8"  (1.727 m), weight 201 lb (91.2 kg). General: NAD HEENT: normocephalic, anicteric Pulmonary: No increased work of breathing, CTAB Cardiovascular: RRR, distal pulses 2+ Abdomen: soft, non-tender, non-distended Genitourinary: deferred Extremities: no edema, erythema, or tenderness Neurologic: Grossly intact Psychiatric: mood appropriate, affect full  Imaging No results found.  Assessment: 60 y.o. presenting for scheduled hysteroscopy D&C  Plan: 1)I have discussed with the patient the indications for the procedure. Included in the discussion were the options of therapy, as wall as their individual risks, benefits, and complications. Ample time was given to answer all questions.   In office pipelle biopsy generally provides comparable results to [facility], however this sampling modality may miss focal lesions if these were previously documented on ultrasound.  It is because of the potential to miss focal lesions that hysteroscopy D&C is also warranted in patient with continued postmenopausal bleeding that is not self limited regardless of prior in office biopsy results or ultrasound findings.  She understands that the risk of continued observation include worsening bleeding or worsening of any underlying pathology.  The choices include: 1. Doing nothing but following her symptoms 2. Attempts at hormonal  manipulation with either BCP or Depo-Provera for premenopausal patients with no concern for focal lesion or endometrial pathology 3. D&C/hysteroscopy. 4. Endometrial ablation via Novasure or other techniques for premenopausal patients with no concern for focal lesion or endometrial pathology  5. As final resort, hysterectomy. After consideration of her history and findings, mutual decision has been made to proceed with D+C/hysteroscopy. While the incidence is low, the risks from this surgery include, but are not limited to, the risks of anesthesia, hemorrhage, infection, perforation, and injury to adjacent structures including bowel, bladder and blood vessels.    2) Routine postoperative instructions were reviewed with the patient and her family in detail today including the expected length of recovery and likely postoperative course.  The patient concurred with the proposed plan, giving informed written consent for the surgery today.  Patient instructed on the importance of being NPO after midnight prior to her procedure.  If warranted preoperative prophylactic antibiotics and SCDs ordered on call to the OR to meet SCIP guidelines and adhere to recommendation laid forth in [place] Number 104 May 2009  "Antibiotic Prophylaxis for Gynecologic Procedures".     Malachy Mood, MD, [facility], [place] 03/18/2021, 11:53 AM

## 2021-03-23 DIAGNOSIS — M79671 Pain in right foot: Secondary | ICD-10-CM | POA: Diagnosis not present

## 2021-03-23 DIAGNOSIS — D2371 Other benign neoplasm of skin of right lower limb, including hip: Secondary | ICD-10-CM | POA: Diagnosis not present

## 2021-03-23 DIAGNOSIS — M79672 Pain in left foot: Secondary | ICD-10-CM | POA: Diagnosis not present

## 2021-03-24 MED ORDER — ORAL CARE MOUTH RINSE: 15.0000 mL | Freq: Once | OROMUCOSAL | Status: AC

## 2021-03-24 MED ORDER — POVIDONE-IODINE 10 % EX SWAB: 2.0000 "application " | Freq: Once | CUTANEOUS | Status: DC

## 2021-03-24 MED ORDER — FAMOTIDINE 20 MG PO TABS: 20.0000 mg | ORAL_TABLET | Freq: Once | ORAL | Status: AC

## 2021-03-24 MED ORDER — SODIUM CHLORIDE 0.9 % IV SOLN: INTRAVENOUS | Status: DC

## 2021-03-24 MED ORDER — CHLORHEXIDINE GLUCONATE 0.12 % MT SOLN: 15.0000 mL | Freq: Once | OROMUCOSAL | Status: AC

## 2021-03-25 ENCOUNTER — Encounter: Payer: Self-pay | Admitting: Obstetrics and Gynecology

## 2021-03-25 ENCOUNTER — Ambulatory Visit
Admission: RE | Admit: 2021-03-25 | Discharge: 2021-03-25 | Disposition: A | Discharge status: Home / Self Care | Payer: Medicare Other | Attending: Obstetrics and Gynecology | Admitting: Obstetrics and Gynecology

## 2021-03-25 ENCOUNTER — Encounter
Admission: RE | Disposition: A | Discharge status: Home / Self Care | Payer: Self-pay | Source: Home / Self Care | Attending: Obstetrics and Gynecology

## 2021-03-25 ENCOUNTER — Ambulatory Visit: Payer: Medicare Other | Admitting: Urgent Care

## 2021-03-25 DIAGNOSIS — N841 Polyp of cervix uteri: Secondary | ICD-10-CM | POA: Diagnosis not present

## 2021-03-25 DIAGNOSIS — N840 Polyp of corpus uteri: Secondary | ICD-10-CM | POA: Diagnosis not present

## 2021-03-25 DIAGNOSIS — Z7982 Long term (current) use of aspirin: Secondary | ICD-10-CM | POA: Insufficient documentation

## 2021-03-25 DIAGNOSIS — Z79899 Other long term (current) drug therapy: Secondary | ICD-10-CM | POA: Insufficient documentation

## 2021-03-25 DIAGNOSIS — Z7984 Long term (current) use of oral hypoglycemic drugs: Secondary | ICD-10-CM | POA: Insufficient documentation

## 2021-03-25 DIAGNOSIS — N950 Postmenopausal bleeding: Secondary | ICD-10-CM | POA: Diagnosis not present

## 2021-03-25 DIAGNOSIS — F17210 Nicotine dependence, cigarettes, uncomplicated: Secondary | ICD-10-CM | POA: Diagnosis not present

## 2021-03-25 DIAGNOSIS — E119 Type 2 diabetes mellitus without complications: Secondary | ICD-10-CM | POA: Insufficient documentation

## 2021-03-25 DIAGNOSIS — E785 Hyperlipidemia, unspecified: Secondary | ICD-10-CM | POA: Insufficient documentation

## 2021-03-25 DIAGNOSIS — I10 Essential (primary) hypertension: Secondary | ICD-10-CM | POA: Insufficient documentation

## 2021-03-25 HISTORY — PX: HYSTEROSCOPY WITH D & C: SHX1775

## 2021-03-25 HISTORY — DX: Other forms of dyspnea: R06.09

## 2021-03-25 LAB — GLUCOSE, CAPILLARY
Glucose-Capillary: 99 mg/dL (ref 70–99)
Glucose-Capillary: 102 mg/dL — ABNORMAL HIGH (ref 70–99)

## 2021-03-25 SURGERY — DILATATION AND CURETTAGE /HYSTEROSCOPY
Anesthesia: General

## 2021-03-25 MED ORDER — PROPOFOL 500 MG/50ML IV EMUL
INTRAVENOUS | Status: AC
  Filled 2021-03-25: qty 50

## 2021-03-25 MED ORDER — IBUPROFEN 600 MG PO TABS: 600.0000 mg | ORAL_TABLET | Freq: Four times a day (QID) | ORAL | 0 refills | Status: DC | PRN

## 2021-03-25 MED ORDER — PROPOFOL 10 MG/ML IV BOLUS
INTRAVENOUS | Status: DC | PRN
  Administered 2021-03-25: 150 mg via INTRAVENOUS

## 2021-03-25 MED ORDER — EPHEDRINE SULFATE 50 MG/ML IJ SOLN
INTRAMUSCULAR | Status: DC | PRN
  Administered 2021-03-25: 15 mg via INTRAVENOUS

## 2021-03-25 MED ORDER — FENTANYL CITRATE (PF) 100 MCG/2ML IJ SOLN: 25.0000 ug | INTRAMUSCULAR | Status: DC | PRN

## 2021-03-25 MED ORDER — LIDOCAINE HCL (CARDIAC) PF 100 MG/5ML IV SOSY
PREFILLED_SYRINGE | INTRAVENOUS | Status: DC | PRN
  Administered 2021-03-25: 100 mg via INTRAVENOUS

## 2021-03-25 MED ORDER — MIDAZOLAM HCL 2 MG/2ML IJ SOLN
INTRAMUSCULAR | Status: DC | PRN
  Administered 2021-03-25: 2 mg via INTRAVENOUS

## 2021-03-25 MED ORDER — MIDAZOLAM HCL 2 MG/2ML IJ SOLN
INTRAMUSCULAR | Status: AC
  Filled 2021-03-25: qty 2

## 2021-03-25 MED ORDER — DEXAMETHASONE SODIUM PHOSPHATE 10 MG/ML IJ SOLN
INTRAMUSCULAR | Status: DC | PRN
  Administered 2021-03-25: 10 mg via INTRAVENOUS

## 2021-03-25 MED ORDER — CHLORHEXIDINE GLUCONATE 0.12 % MT SOLN
OROMUCOSAL | Status: AC
  Administered 2021-03-25: 15 mL via OROMUCOSAL
  Filled 2021-03-25: qty 15

## 2021-03-25 MED ORDER — FAMOTIDINE 20 MG PO TABS
ORAL_TABLET | ORAL | Status: AC
  Administered 2021-03-25: 20 mg via ORAL
  Filled 2021-03-25: qty 1

## 2021-03-25 MED ORDER — FENTANYL CITRATE (PF) 100 MCG/2ML IJ SOLN
INTRAMUSCULAR | Status: DC | PRN
  Administered 2021-03-25: 25 ug via INTRAVENOUS

## 2021-03-25 MED ORDER — HYDROCODONE-ACETAMINOPHEN 5-325 MG PO TABS: 1.0000 | ORAL_TABLET | Freq: Four times a day (QID) | ORAL | 0 refills | Status: DC | PRN

## 2021-03-25 MED ORDER — GLYCOPYRROLATE 0.2 MG/ML IJ SOLN
INTRAMUSCULAR | Status: DC | PRN
  Administered 2021-03-25: .2 mg via INTRAVENOUS

## 2021-03-25 MED ORDER — FENTANYL CITRATE (PF) 100 MCG/2ML IJ SOLN
INTRAMUSCULAR | Status: AC
  Filled 2021-03-25: qty 2

## 2021-03-25 MED ORDER — PROMETHAZINE HCL 25 MG/ML IJ SOLN: 6.2500 mg | INTRAMUSCULAR | Status: DC | PRN

## 2021-03-25 MED ORDER — 0.9 % SODIUM CHLORIDE (POUR BTL) OPTIME
TOPICAL | Status: DC | PRN
  Administered 2021-03-25: 200 mL

## 2021-03-25 MED ORDER — ONDANSETRON HCL 4 MG/2ML IJ SOLN
INTRAMUSCULAR | Status: DC | PRN
  Administered 2021-03-25: 4 mg via INTRAVENOUS

## 2021-03-25 SURGICAL SUPPLY — 25 items
DEVICE MYOSURE LITE (MISCELLANEOUS) ×2 IMPLANT
DEVICE MYOSURE REACH (MISCELLANEOUS) IMPLANT
DRSG TELFA 4X3 1S NADH ST (GAUZE/BANDAGES/DRESSINGS) ×2 IMPLANT
ELECT REM PT RETURN 9FT ADLT (ELECTROSURGICAL)
ELECTRODE REM PT RTRN 9FT ADLT (ELECTROSURGICAL) IMPLANT
GAUZE 4X4 16PLY ~~LOC~~+RFID DBL (SPONGE) ×2 IMPLANT
GLOVE SURG ENC MOIS LTX SZ7 (GLOVE) ×2 IMPLANT
GLOVE SURG UNDER LTX SZ7.5 (GLOVE) ×2 IMPLANT
GOWN STRL REUS W/ TWL LRG LVL3 (GOWN DISPOSABLE) ×2 IMPLANT
GOWN STRL REUS W/TWL LRG LVL3 (GOWN DISPOSABLE) ×2
INFUSOR MANOMETER BAG 3000ML (MISCELLANEOUS) IMPLANT
IV LACTATED RINGER IRRG 3000ML (IV SOLUTION)
IV LR IRRIG 3000ML ARTHROMATIC (IV SOLUTION) IMPLANT
IV NS IRRIG 3000ML ARTHROMATIC (IV SOLUTION) ×2 IMPLANT
KIT PROCEDURE FLUENT (KITS) ×2 IMPLANT
KIT TURNOVER CYSTO (KITS) ×2 IMPLANT
MANIFOLD NEPTUNE II (INSTRUMENTS) ×2 IMPLANT
PACK DNC HYST (MISCELLANEOUS) ×2 IMPLANT
PAD OB MATERNITY 4.3X12.25 (PERSONAL CARE ITEMS) ×2 IMPLANT
PAD PREP 24X41 OB/GYN DISP (PERSONAL CARE ITEMS) ×2 IMPLANT
SCRUB EXIDINE 4% CHG 4OZ (MISCELLANEOUS) ×2 IMPLANT
SEAL ROD LENS SCOPE MYOSURE (ABLATOR) ×2 IMPLANT
TOWEL OR 17X26 4PK STRL BLUE (TOWEL DISPOSABLE) ×2 IMPLANT
TUBING CONNECTING 10 (TUBING) ×2 IMPLANT
WATER STERILE IRR 500ML POUR (IV SOLUTION) ×2 IMPLANT

## 2021-03-25 NOTE — Op Note (Signed)
Preoperative Diagnosis: 1) 60 y.o. with postmenopausal bleeding 2) Abnormal endometrial ultrasound  Postoperative Diagnosis: 1) 60 y.o. with postmenopausal bleeding 2) Abnormal endometrial ultrasound 3) Endometrial polyp  Operation Performed: Hysteroscopy, polypectomy  Indication: Focal lesion on postmenopausal bleeding work up  Anesthesia: .General  Primary Surgeon: Malachy Mood, MD  Assistant: none  Preoperative Antibiotics: none  Estimated Blood Loss: minimal  IV Fluids: 619mL  Urine Output:: ~28mL straiggt cath  Drains or Tubes: none  Implants: none  Specimens Removed: endometrial curettings  Complications: none  Intraoperative Findings:  normal cervix, normal uterine fundus and tubal ostia.  There was a anterior lower uterine segment polyp noted as well as a small intracervical polyp.  Remainder of endometrium thin and atrophic in appearance  Patient Condition: stable  Procedure in Detail:  Patient was taken to the operating room were she was administered general endotracheal anesthesia.  She was positioned in the dorsal lithotomy position utilizing Allen stirups, prepped and draped in the usual sterile fashion.  Uterus was noted to be non-enlarged in size, anteverted.   Prior to proceeding with the case a time out was performed.  Attention was turned to the patient's pelvis.  A red rubber catheter was used to empty the patient's bladder.  An operative speculum was placed to allow visualization of the cervix.  The anterior lip of the cervix was grasped with a single tooth tenaculum and the cervix was sequentially dilated using pratt dilators.  The hysteroscope was then advanced into the uterine cavity noting the above findings.  A myosure device was used to resect the endometrial polyp, followed by random 4 quadrant endometrial biopsy  The single tooth tenaculum was removed from the cervix.  The tenaculum sites and cervix were noted to be  Hemostatic before removing  the operative speculum.  Sponge needle and instrument counts were corrects times two.  The patient tolerated the procedure well and was taken to the recovery room in stable condition.

## 2021-03-25 NOTE — Interval H&P Note (Signed)
History and Physical Interval Note:  03/25/2021 10:39 AM  Donna Riley  has presented today for surgery, with the diagnosis of Endometrial polyp.  The various methods of treatment have been discussed with the patient and family. After consideration of risks, benefits and other options for treatment, the patient has consented to  Procedure(s): DILATATION AND CURETTAGE /HYSTEROSCOPY (N/A) as a surgical intervention.  The patient's history has been reviewed, patient examined, no change in status, stable for surgery.  I have reviewed the patient's chart and labs.  Questions were answered to the patient's satisfaction.     Malachy Mood

## 2021-03-25 NOTE — Transfer of Care (Signed)
Immediate Anesthesia Transfer of Care Note  Patient: Donna Riley  Procedure(s) Performed: DILATATION AND CURETTAGE /HYSTEROSCOPY  Patient Location: PACU  Anesthesia Type:General  Level of Consciousness: sedated  Airway & Oxygen Therapy: Patient Spontanous Breathing and Patient connected to face mask oxygen  Post-op Assessment: Report given to RN and Post -op Vital signs reviewed and stable  Post vital signs: Reviewed and stable  Last Vitals:  Vitals Value Taken Time  BP 101/56 03/25/21 1250  Temp 36.2 C 03/25/21 1238  Pulse 82 03/25/21 1300  Resp 14 03/25/21 1300  SpO2 89 % 03/25/21 1300  Vitals shown include unvalidated device data.  Last Pain:  Vitals:   03/25/21 1031  TempSrc: Temporal  PainSc: 0-No pain         Complications: No notable events documented.

## 2021-03-25 NOTE — Discharge Instructions (Addendum)
AMBULATORY SURGERY  DISCHARGE INSTRUCTIONS   The drugs that you were given will stay in your system until tomorrow so for the next 24 hours you should not:  Drive an automobile Make any legal decisions Drink any alcoholic beverage   You may resume regular meals tomorrow.  Today it is better to start with liquids and gradually work up to solid foods.  You may eat anything you prefer, but it is better to start with liquids, then soup and crackers, and gradually work up to solid foods.   Please notify your doctor immediately if you have any unusual bleeding, trouble breathing, redness and pain at the surgery site, drainage, fever, or pain not relieved by medication.    Additional Instructions:  No Baths or anything in the vagina until released   Please contact your physician with any problems or Same Day Surgery at [phone], Monday through Friday 6 am to 4 pm, or [place] at [facility] number at [phone].

## 2021-03-25 NOTE — Anesthesia Preprocedure Evaluation (Signed)
Anesthesia Evaluation  Patient identified by MRN, date of birth, ID band Patient awake    Reviewed: Allergy & Precautions, H&P , NPO status , Patient's Chart, lab work & pertinent test results  History of Anesthesia Complications Negative for: history of anesthetic complications  Airway Mallampati: II  TM Distance: >3 FB     Dental  (+) Edentulous Upper, Dental Advidsory Given   Pulmonary neg shortness of breath, neg sleep apnea, neg COPD, neg recent URI, Current Smoker,    breath sounds clear to auscultation       Cardiovascular Exercise Tolerance: Good hypertension, (-) angina+ Peripheral Vascular Disease  (-) CAD, (-) Past MI and (-) Cardiac Stents (-) dysrhythmias  Rhythm:regular Rate:Normal     Neuro/Psych negative neurological ROS  negative psych ROS   GI/Hepatic Neg liver ROS, GERD  Controlled,  Endo/Other  diabetes  Renal/GU negative Renal ROS  negative genitourinary   Musculoskeletal   Abdominal   Peds  Hematology negative hematology ROS (+)   Anesthesia Other Findings Past Medical History: No date: Arthritis     Comment:  shoulders and neck 2016: Breast cancer ([place])     Comment:   Left breast with chemo + rad tx's with lumpectomy. 06/23/2016: Breast wound, left, sequela 06/2014: Diabetes mellitus without complication ([place]) No date: Elevated LFTs 08/01/2016: Fatigue No date: GERD (gastroesophageal reflux disease) No date: Gout 01/23/2015: Hydradenitis No date: Hyperlipidemia No date: Hypertension 10/15/2016: Mastitis 2016: Personal history of chemotherapy     Comment:  left breast ca 2016: Personal history of radiation therapy     Comment:  LEFT lumpectomy No date: Personal history of radiation therapy No date: Positive PPD, treated No date: Shortness of breath dyspnea     Comment:  with exertion  Past Surgical History: 07/31/2014: BREAST BIOPSY; Left     Comment:  [facility] and DCIS  03/08/2016:  BREAST BIOPSY; Left     Comment:  INTRADUCTAL PAPILLOMA  07/2014: BREAST LUMPECTOMY; Left     Comment:  IDC, DCIS, clear margins, positive LN 04/05/2016: BREAST LUMPECTOMY; Left     Comment:  excision of intraductal papilloma, no malignancy or               atypia 04/05/2016: BREAST LUMPECTOMY WITH NEEDLE LOCALIZATION; Left     Comment:  Procedure: BREAST LUMPECTOMY WITH NEEDLE LOCALIZATION;                Surgeon: Hubbard Robinson, MD;  Location: [facility];                Service: General;  Laterality: Left; 08/27/14: BREAST LUMPECTOMY WITH NEEDLE LOCALIZATION AND AXILLARY  SENTINEL LYMPH NODE BX; Left 09/07/2013: CHOLECYSTECTOMY 08/28/2015: COLONOSCOPY WITH PROPOFOL; N/A     Comment:  Procedure: COLONOSCOPY WITH PROPOFOL;  Surgeon: Lucilla Lame, MD;  Location: [place];  Service:               Endoscopy;  Laterality: N/A;  Diabetic oral 2014: ESOPHAGOGASTRODUODENOSCOPY     Comment:  gastritis 04/16/2019: ESOPHAGOGASTRODUODENOSCOPY (EGD) WITH PROPOFOL; N/A     Comment:  Procedure: ESOPHAGOGASTRODUODENOSCOPY (EGD) WITH               PROPOFOL;  Surgeon: Virgel Manifold, MD;  Location:               [facility];  Service: Endoscopy;  Laterality: N/A; 10/03/2019: ESOPHAGOGASTRODUODENOSCOPY (EGD) WITH PROPOFOL; N/A  Comment:  Procedure: ESOPHAGOGASTRODUODENOSCOPY (EGD) WITH               PROPOFOL;  Surgeon: Virgel Manifold, MD;  Location:               [facility];  Service: Endoscopy;  Laterality: N/A; 08/28/2015: POLYPECTOMY     Comment:  Procedure: POLYPECTOMY INTESTINAL;  Surgeon: Lucilla Lame, MD;  Location: [place];  Service:               Endoscopy;;  Rectal polyp No date: TUBAL LIGATION; Bilateral 1/61/0960: UMBILICAL HERNIA REPAIR; N/A     Comment:  Procedure: HERNIA REPAIR UMBILICAL ADULT;  Surgeon: Olean Ree, MD;  Location: [facility];  Service: General;                Laterality: N/A; 04/10/2015:  VENTRAL HERNIA REPAIR; N/A     Comment:  Procedure: HERNIA REPAIR VENTRAL ADULT;  Surgeon: Leonie Green, MD;  Location: [facility];  Service: General;              Laterality: N/A;  BMI    Body Mass Index: 30.56 kg/m      Reproductive/Obstetrics negative OB ROS                             Anesthesia Physical  Anesthesia Plan  ASA: II  Anesthesia Plan: General   Post-op Pain Management:    Induction: Intravenous  PONV Risk Score and Plan: Ondansetron, Dexamethasone, Treatment may vary due to age or medical condition and Midazolam  Airway Management Planned: LMA  Additional Equipment:   Intra-op Plan:   Post-operative Plan: Extubation in OR  Informed Consent: I have reviewed the patients History and Physical, chart, labs and discussed the procedure including the risks, benefits and alternatives for the proposed anesthesia with the patient or authorized representative who has indicated his/her understanding and acceptance.     Dental Advisory Given  Plan Discussed with: Anesthesiologist, CRNA and Surgeon  Anesthesia Plan Comments:         Anesthesia Quick Evaluation

## 2021-03-25 NOTE — Anesthesia Procedure Notes (Signed)
Procedure Name: LMA Insertion Date/Time: 03/25/2021 12:00 PM Performed by: Aline Brochure, CRNA Pre-anesthesia Checklist: Patient identified, Patient being monitored, Timeout performed, Emergency Drugs available and Suction available Patient Re-evaluated:Patient Re-evaluated prior to induction Oxygen Delivery Method: Circle system utilized Preoxygenation: Pre-oxygenation with 100% oxygen Induction Type: IV induction Ventilation: Mask ventilation without difficulty LMA: LMA inserted LMA Size: 4.0 Tube type: Oral Number of attempts: 1 Placement Confirmation: positive ETCO2 and breath sounds checked- equal and bilateral Tube secured with: Tape Dental Injury: Teeth and Oropharynx as per pre-operative assessment

## 2021-03-26 LAB — SURGICAL PATHOLOGY

## 2021-03-26 NOTE — Anesthesia Postprocedure Evaluation (Signed)
Anesthesia Post Note  Patient: Donna Riley  Procedure(s) Performed: DILATATION AND CURETTAGE /HYSTEROSCOPY  Patient location during evaluation: PACU Anesthesia Type: General Level of consciousness: awake and alert Pain management: pain level controlled Vital Signs Assessment: post-procedure vital signs reviewed and stable Respiratory status: spontaneous breathing, nonlabored ventilation, respiratory function stable and patient connected to nasal cannula oxygen Cardiovascular status: blood pressure returned to baseline and stable Postop Assessment: no apparent nausea or vomiting Anesthetic complications: no   No notable events documented.   Last Vitals:  Vitals:   03/25/21 1320 03/25/21 1328  BP: (!) 121/56 (!) 118/58  Pulse: 66 67  Resp: 15 16  Temp:  (!) 36.1 C  SpO2: 94% 100%    Last Pain:  Vitals:   03/25/21 1328  TempSrc: Temporal  PainSc: 0-No pain                 Martha Clan

## 2021-04-01 ENCOUNTER — Other Ambulatory Visit: Payer: Self-pay

## 2021-04-01 ENCOUNTER — Encounter: Payer: Self-pay | Admitting: *Deleted

## 2021-04-01 ENCOUNTER — Emergency Department: Payer: Medicare Other

## 2021-04-01 ENCOUNTER — Emergency Department
Admission: EM | Admit: 2021-04-01 | Discharge: 2021-04-01 | Disposition: A | Discharge status: Home / Self Care | Payer: Medicare Other | Attending: Emergency Medicine | Admitting: Emergency Medicine

## 2021-04-01 DIAGNOSIS — Z5321 Procedure and treatment not carried out due to patient leaving prior to being seen by health care provider: Secondary | ICD-10-CM | POA: Insufficient documentation

## 2021-04-01 DIAGNOSIS — R0789 Other chest pain: Secondary | ICD-10-CM | POA: Diagnosis not present

## 2021-04-01 DIAGNOSIS — R0602 Shortness of breath: Secondary | ICD-10-CM | POA: Insufficient documentation

## 2021-04-01 DIAGNOSIS — F17210 Nicotine dependence, cigarettes, uncomplicated: Secondary | ICD-10-CM | POA: Insufficient documentation

## 2021-04-01 DIAGNOSIS — R509 Fever, unspecified: Secondary | ICD-10-CM | POA: Diagnosis not present

## 2021-04-01 DIAGNOSIS — Z20822 Contact with and (suspected) exposure to covid-19: Secondary | ICD-10-CM | POA: Diagnosis not present

## 2021-04-01 LAB — BASIC METABOLIC PANEL
Anion gap: 12 (ref 5–15)
BUN: 21 mg/dL (ref 8–23)
CO2: 23 mmol/L (ref 22–32)
Calcium: 8.9 mg/dL (ref 8.9–10.3)
Chloride: 101 mmol/L (ref 98–111)
Creatinine, Ser: 1.02 mg/dL — ABNORMAL HIGH (ref 0.44–1.00)
GFR, Estimated: >60 mL/min (ref >60)
Glucose, Bld: 259 mg/dL — ABNORMAL HIGH (ref 70–99)
Potassium: 3.3 mmol/L — ABNORMAL LOW (ref 3.5–5.1)
Sodium: 136 mmol/L (ref 135–145)

## 2021-04-01 LAB — TROPONIN I (HIGH SENSITIVITY)
Troponin I (High Sensitivity): 26 ng/L — ABNORMAL HIGH (ref <18)
Troponin I (High Sensitivity): 27 ng/L — ABNORMAL HIGH (ref <18)

## 2021-04-01 LAB — CBC
HCT: 37.7 % (ref 36.0–46.0)
Hemoglobin: 12.5 g/dL (ref 12.0–15.0)
MCH: 28.7 pg (ref 26.0–34.0)
MCHC: 33.2 g/dL (ref 30.0–36.0)
MCV: 86.5 fL (ref 80.0–100.0)
Platelets: 236 10*3/uL (ref 150–400)
RBC: 4.36 MIL/uL (ref 3.87–5.11)
RDW: 14.1 % (ref 11.5–15.5)
WBC: 15 10*3/uL — ABNORMAL HIGH (ref 4.0–10.5)
nRBC: 0 % (ref 0.0–0.2)

## 2021-04-01 LAB — RESP PANEL BY RT-PCR (FLU A&B, COVID) ARPGX2
Influenza A by PCR: NEGATIVE
Influenza B by PCR: NEGATIVE
SARS Coronavirus 2 by RT PCR: NEGATIVE

## 2021-04-01 NOTE — ED Triage Notes (Addendum)
Pt reports fever, chills, bodyaches, sx for 2 days.  Cig smoker.  No cough.  Pt alert  speech clear.  Pt has intermittent sob. Pt has chest tightness

## 2021-04-02 ENCOUNTER — Ambulatory Visit (INDEPENDENT_AMBULATORY_CARE_PROVIDER_SITE_OTHER): Payer: Medicare Other | Admitting: Obstetrics and Gynecology

## 2021-04-02 ENCOUNTER — Encounter: Payer: Self-pay | Admitting: Obstetrics and Gynecology

## 2021-04-02 ENCOUNTER — Telehealth: Payer: Self-pay

## 2021-04-02 VITALS — BP 166/92 | Ht 68.0 in | Wt 195.0 lb

## 2021-04-02 DIAGNOSIS — E782 Mixed hyperlipidemia: Secondary | ICD-10-CM | POA: Diagnosis not present

## 2021-04-02 DIAGNOSIS — Z87891 Personal history of nicotine dependence: Secondary | ICD-10-CM | POA: Diagnosis not present

## 2021-04-02 DIAGNOSIS — I2510 Atherosclerotic heart disease of native coronary artery without angina pectoris: Secondary | ICD-10-CM | POA: Diagnosis not present

## 2021-04-02 DIAGNOSIS — M5489 Other dorsalgia: Secondary | ICD-10-CM | POA: Diagnosis not present

## 2021-04-02 DIAGNOSIS — I70219 Atherosclerosis of native arteries of extremities with intermittent claudication, unspecified extremity: Secondary | ICD-10-CM | POA: Diagnosis not present

## 2021-04-02 DIAGNOSIS — I10 Essential (primary) hypertension: Secondary | ICD-10-CM | POA: Diagnosis not present

## 2021-04-02 DIAGNOSIS — Z4889 Encounter for other specified surgical aftercare: Secondary | ICD-10-CM

## 2021-04-02 NOTE — Telephone Encounter (Signed)
Copied from [place] [id number]. Topic: General - Other >> Apr 02, 2021  2:55 PM Yvette Rack wrote: Reason for CRM: Pt stated she was diagnosed with COPD and the NP from [facility] told her to contact her doctor for a Rx for an inhaler. Pt requests that the Rx be sent to [place], [place] Phone: [phone]   Fax: [phone]

## 2021-04-02 NOTE — Telephone Encounter (Signed)
Informed patient with message

## 2021-04-02 NOTE — Progress Notes (Signed)
      Postoperative Follow-up Patient presents post op from hysteroscopy, D&C 1weeks ago for  endometrial polyp .  Subjective: Patient reports marked improvement in her preop symptoms. Eating a regular diet without difficulty. Pain is controlled without any medications.  Activity: normal activities of daily living.  Objective: Blood pressure (!) 166/92, height 5\' 8"  (1.727 m), weight 195 lb (88.5 kg).  General: NAD Pulmonary: no increased work of breathing Abdomen: soft, non-tender, non-distended Extremities: no edema Neurologic: normal gait  Admission on 03/25/2021, Discharged on 03/25/2021  Component Date Value Ref Range Status   Glucose-Capillary 03/25/2021 99  70 - 99 mg/dL Final   Glucose reference range applies only to samples taken after fasting for at least 8 hours.   SURGICAL PATHOLOGY 03/25/2021    Final-Edited                   Value:SURGICAL PATHOLOGY CASE: [id number] PATIENT: Prabhnoor Greenslade Surgical Pathology Report     Specimen Submitted: A. Endometrial polyp  Clinical History: Endometrial polyp      DIAGNOSIS: A. ENDOMETRIAL POLYP; POLYPECTOMY: - MIXED ENDOMETRIAL AND ENDOCERVICAL POLYP. - INACTIVE BACKGROUND ENDOMETRIUM. - NEGATIVE FOR ATYPIA / EIN AND MALIGNANCY.  GROSS DESCRIPTION: A. Labeled: Endometrial polyp Received: Fresh Collection time: 12:20 PM on 03/25/2021 Placed into formalin time: 1:06 PM on 03/25/2021 Tissue fragment(s): Multiple Size: Aggregate, 2.3 x 1.0 x 0.2 cm Description: Tan-pink soft tissue fragments Entirely submitted in 1 cassette.  [facility] 03/25/2021  Final Diagnosis performed by Quay Burow, MD.   Electronically signed 03/26/2021 9:38:27AM The electronic signature indicates that the named Attending Pathologist has evaluated the specimen Technical component performed at [facility], [address] [place], [place] [address] Lab: [phone] Dir: Worthy Rancher, MD, MMM  Professional component  performed at [facility], [facility], [place], [place], [place] [address] Lab: [phone] Dir: Kathi Simpers, MD    Glucose-Capillary 03/25/2021 102 (A)  70 - 99 mg/dL Final   Glucose reference range applies only to samples taken after fasting for at least 8 hours.   Comment 1 03/25/2021 Notify RN   Final   Comment 2 03/25/2021 Document in Chart   Final    Assessment: 60 y.o. s/p hysteroscopy D&C endometrial polyp stable  Plan: Patient has done well after surgery with no apparent complications.  I have discussed the post-operative course to date, and the expected progress moving forward.  The patient understands what complications to be concerned about.  I will see the patient in routine follow up, or sooner if needed.    Activity plan: No restriction.  Was seen in ER but did not complete evaluation yesterday Troponin I elevated does have follow up with her cardiologist later today  Return in about 5 weeks (around 05/07/2021) for 6 week postop.    Malachy Mood, MD, [facility], [facility] 04/02/2021, 10:38 AM

## 2021-04-05 ENCOUNTER — Telehealth: Payer: Self-pay

## 2021-04-05 ENCOUNTER — Other Ambulatory Visit: Payer: Self-pay | Admitting: Obstetrics

## 2021-04-05 DIAGNOSIS — B3731 Acute candidiasis of vulva and vagina: Secondary | ICD-10-CM

## 2021-04-05 MED ORDER — FLUCONAZOLE 150 MG PO TABS: 150.0000 mg | ORAL_TABLET | Freq: Once | ORAL | 0 refills | Status: AC

## 2021-04-05 NOTE — Telephone Encounter (Signed)
Pt calling; has a yeast inf; needs antibx.  [phone]  Adv pt we prefer she try monistat first either 3d or 7d and if no better to be seen.  Pt states AMS told her he would rx an antibx if she had any itching or anything; itching started yesterday.  Pharm correct in chart.

## 2021-04-06 NOTE — Telephone Encounter (Signed)
Pt aware.

## 2021-04-29 ENCOUNTER — Other Ambulatory Visit: Payer: Self-pay | Admitting: Internal Medicine

## 2021-04-29 DIAGNOSIS — E1169 Type 2 diabetes mellitus with other specified complication: Secondary | ICD-10-CM

## 2021-04-29 DIAGNOSIS — I10 Essential (primary) hypertension: Secondary | ICD-10-CM

## 2021-04-30 NOTE — Telephone Encounter (Signed)
Requested medication (s) are due for refill today: yes, Lipitor with slightly different signature  Requested medication (s) are on the active medication list: yes  Last refill:  04/29/21  Future visit scheduled: 07/06/21  Notes to clinic:  Record states pt taking Norvasc differently than ordered, Lipitor by Historical Provider, both just filled 04/29/21      Requested Prescriptions  Pending Prescriptions Disp Refills   amLODipine (NORVASC) 10 MG tablet [Pharmacy Med Name: amlodipine 10 mg tablet] 60 tablet 2    Sig: [place]     Cardiovascular:  Calcium Channel Blockers Failed - 04/29/2021  8:40 AM      Failed - Last BP in normal range    BP Readings from Last 1 Encounters:  04/02/21 (!) 166/92          Passed - Valid encounter within last 6 months    Recent Outpatient Visits           1 month ago Type II diabetes mellitus with complication [facility])   [facility] Glean Hess, MD   4 months ago [facility] Glean Hess, MD   5 months ago Type II diabetes mellitus with complication [facility])   [facility] Glean Hess, MD   6 months ago Gross hematuria   [facility] Glean Hess, MD   9 months ago Type II diabetes mellitus with complication [facility])   [facility] Glean Hess, MD       Future Appointments             In 2 months Glean Hess, MD [facility]             atorvastatin (LIPITOR) 10 MG tablet [Pharmacy Med Name: atorvastatin 10 mg tablet] 90 tablet 3    Sig: TAKE ONE TABLET BY MOUTH AT BEDTIME     Cardiovascular:  Antilipid - Statins Failed - 04/29/2021  8:40 AM      Failed - HDL in normal range and within 360 days    HDL  Date Value Ref Range Status  07/24/2020 37 (L) >40 mg/dL Final          Failed - Triglycerides in normal range and within 360 days    Triglycerides  Date Value Ref Range Status  07/24/2020 301 (H) <150 mg/dL  Final          Passed - Total Cholesterol in normal range and within 360 days    Cholesterol  Date Value Ref Range Status  07/24/2020 159 0 - 200 mg/dL Final          Passed - LDL in normal range and within 360 days    LDL Cholesterol  Date Value Ref Range Status  07/24/2020 62 0 - 99 mg/dL Final    Comment:           Total Cholesterol/HDL:CHD Risk Coronary Heart Disease Risk Table                     Men   Women  1/2 Average Risk   3.4   3.3  Average Risk       5.0   4.4  2 X Average Risk   9.6   7.1  3 X Average Risk  23.4   11.0        Use the calculated Patient Ratio above and the CHD Risk Table to determine  the patient's CHD Risk.        ATP III CLASSIFICATION (LDL):  <100     mg/dL   Optimal  100-129  mg/dL   Near or Above                    Optimal  130-159  mg/dL   Borderline  160-189  mg/dL   High  >190     mg/dL   Very High Performed at [facility], [address], [place], [place] [address]           Passed - Patient is not pregnant      Passed - Valid encounter within last 12 months    Recent Outpatient Visits           1 month ago Type II diabetes mellitus with complication [facility])   [facility] Glean Hess, MD   4 months ago [facility] Glean Hess, MD   5 months ago Type II diabetes mellitus with complication [facility])   [facility] Glean Hess, MD   6 months ago Gross hematuria   [facility] Glean Hess, MD   9 months ago Type II diabetes mellitus with complication [facility])   [facility] Glean Hess, MD       Future Appointments             In 2 months [facility] Melia Jesse Sans, MD [facility]

## 2021-05-05 ENCOUNTER — Other Ambulatory Visit: Payer: Self-pay

## 2021-05-05 ENCOUNTER — Encounter: Payer: Self-pay | Admitting: Obstetrics and Gynecology

## 2021-05-05 ENCOUNTER — Ambulatory Visit (INDEPENDENT_AMBULATORY_CARE_PROVIDER_SITE_OTHER): Payer: Medicare Other | Admitting: Obstetrics and Gynecology

## 2021-05-05 VITALS — BP 148/76 | Ht 68.0 in | Wt 200.0 lb

## 2021-05-05 DIAGNOSIS — Z4889 Encounter for other specified surgical aftercare: Secondary | ICD-10-CM

## 2021-05-05 NOTE — Progress Notes (Signed)
      Postoperative Follow-up Patient presents post op from  hysteroscopy D&C 6weeks ago for PMB and endometrial polyp  Subjective: Patient reports marked improvement in her preop symptoms. Eating a regular diet without difficulty. The patient is not having any pain.  Activity: normal activities of daily living.  Objective: Blood pressure (!) 148/76, height 5\' 8"  (1.727 m), weight 200 lb (90.7 kg).  General: NAD Pulmonary: no increased work of breathing Abdomen: soft, non-tender, non-distended, incision(s) D/C/I GU: normal external female genitalia normal cervix, no CMT, uterus normal in shape and contour, no adnexal tenderness or masses Extremities: no edema Neurologic: normal gait   Admission on 03/25/2021, Discharged on 03/25/2021  Component Date Value Ref Range Status   Glucose-Capillary 03/25/2021 99  70 - 99 mg/dL Final   Glucose reference range applies only to samples taken after fasting for at least 8 hours.   SURGICAL PATHOLOGY 03/25/2021    Final-Edited                   Value:SURGICAL PATHOLOGY CASE: [id number] PATIENT: Janellie Ketcher Surgical Pathology Report     Specimen Submitted: A. Endometrial polyp  Clinical History: Endometrial polyp      DIAGNOSIS: A. ENDOMETRIAL POLYP; POLYPECTOMY: - MIXED ENDOMETRIAL AND ENDOCERVICAL POLYP. - INACTIVE BACKGROUND ENDOMETRIUM. - NEGATIVE FOR ATYPIA / EIN AND MALIGNANCY.  GROSS DESCRIPTION: A. Labeled: Endometrial polyp Received: Fresh Collection time: 12:20 PM on 03/25/2021 Placed into formalin time: 1:06 PM on 03/25/2021 Tissue fragment(s): Multiple Size: Aggregate, 2.3 x 1.0 x 0.2 cm Description: Tan-pink soft tissue fragments Entirely submitted in 1 cassette.  [facility] 03/25/2021  Final Diagnosis performed by Quay Burow, MD.   Electronically signed 03/26/2021 9:38:27AM The electronic signature indicates that the named Attending Pathologist has evaluated the specimen Technical component performed at  [facility], [address] [place], [place] [address] Lab: [phone] Dir: Worthy Rancher, MD, MMM  Professional component performed at [facility], [facility], [place], [place], [place] [address] Lab: [phone] Dir: Kathi Simpers, MD    Glucose-Capillary 03/25/2021 102 (H)  70 - 99 mg/dL Final   Glucose reference range applies only to samples taken after fasting for at least 8 hours.   Comment 1 03/25/2021 Notify RN   Final   Comment 2 03/25/2021 Document in Chart   Final    Assessment: 60 y.o. s/p hysteroscopy, D&C stable  Plan: Patient has done well after surgery with no apparent complications.  I have discussed the post-operative course to date, and the expected progress moving forward.  The patient understands what complications to be concerned about.  I will see the patient in routine follow up, or sooner if needed.    Activity plan: No restriction.   Malachy Mood, MD, [facility], [facility] 05/05/2021, 10:15 AM

## 2021-05-06 ENCOUNTER — Inpatient Hospital Stay (HOSPITAL_BASED_OUTPATIENT_CLINIC_OR_DEPARTMENT_OTHER): Payer: Medicare Other | Admitting: Oncology

## 2021-05-06 ENCOUNTER — Encounter: Payer: Self-pay | Admitting: Oncology

## 2021-05-06 ENCOUNTER — Other Ambulatory Visit: Payer: Medicare Other

## 2021-05-06 ENCOUNTER — Ambulatory Visit: Payer: Medicare Other | Admitting: Oncology

## 2021-05-06 ENCOUNTER — Inpatient Hospital Stay: Payer: Medicare Other | Attending: Oncology

## 2021-05-06 VITALS — BP 152/71 | HR 81 | Temp 97.7°F | Resp 18 | Wt 199.2 lb

## 2021-05-06 DIAGNOSIS — E785 Hyperlipidemia, unspecified: Secondary | ICD-10-CM | POA: Insufficient documentation

## 2021-05-06 DIAGNOSIS — M8580 Other specified disorders of bone density and structure, unspecified site: Secondary | ICD-10-CM | POA: Diagnosis not present

## 2021-05-06 DIAGNOSIS — C50912 Malignant neoplasm of unspecified site of left female breast: Secondary | ICD-10-CM

## 2021-05-06 DIAGNOSIS — I10 Essential (primary) hypertension: Secondary | ICD-10-CM | POA: Diagnosis not present

## 2021-05-06 DIAGNOSIS — Z170 Estrogen receptor positive status [ER+]: Secondary | ICD-10-CM | POA: Diagnosis not present

## 2021-05-06 DIAGNOSIS — R7989 Other specified abnormal findings of blood chemistry: Secondary | ICD-10-CM | POA: Insufficient documentation

## 2021-05-06 DIAGNOSIS — F17210 Nicotine dependence, cigarettes, uncomplicated: Secondary | ICD-10-CM | POA: Diagnosis not present

## 2021-05-06 DIAGNOSIS — K219 Gastro-esophageal reflux disease without esophagitis: Secondary | ICD-10-CM | POA: Diagnosis not present

## 2021-05-06 DIAGNOSIS — E119 Type 2 diabetes mellitus without complications: Secondary | ICD-10-CM | POA: Diagnosis not present

## 2021-05-06 DIAGNOSIS — Z79899 Other long term (current) drug therapy: Secondary | ICD-10-CM | POA: Diagnosis not present

## 2021-05-06 DIAGNOSIS — J449 Chronic obstructive pulmonary disease, unspecified: Secondary | ICD-10-CM | POA: Insufficient documentation

## 2021-05-06 DIAGNOSIS — N644 Mastodynia: Secondary | ICD-10-CM | POA: Diagnosis not present

## 2021-05-06 DIAGNOSIS — C7A8 Other malignant neuroendocrine tumors: Secondary | ICD-10-CM | POA: Diagnosis not present

## 2021-05-06 DIAGNOSIS — Z853 Personal history of malignant neoplasm of breast: Secondary | ICD-10-CM | POA: Insufficient documentation

## 2021-05-06 DIAGNOSIS — Z7984 Long term (current) use of oral hypoglycemic drugs: Secondary | ICD-10-CM | POA: Insufficient documentation

## 2021-05-06 DIAGNOSIS — Z7982 Long term (current) use of aspirin: Secondary | ICD-10-CM | POA: Insufficient documentation

## 2021-05-06 DIAGNOSIS — Z87891 Personal history of nicotine dependence: Secondary | ICD-10-CM | POA: Diagnosis not present

## 2021-05-06 LAB — CBC WITH DIFFERENTIAL/PLATELET
Abs Immature Granulocytes: 0.02 10*3/uL (ref 0.00–0.07)
Basophils Absolute: 0.1 10*3/uL (ref 0.0–0.1)
Basophils Relative: 1 %
Eosinophils Absolute: 0.1 10*3/uL (ref 0.0–0.5)
Eosinophils Relative: 2 %
HCT: 42.7 % (ref 36.0–46.0)
Hemoglobin: 13.8 g/dL (ref 12.0–15.0)
Immature Granulocytes: 0 %
Lymphocytes Relative: 37 %
Lymphs Abs: 3.1 10*3/uL (ref 0.7–4.0)
MCH: 27.4 pg (ref 26.0–34.0)
MCHC: 32.3 g/dL (ref 30.0–36.0)
MCV: 84.7 fL (ref 80.0–100.0)
Monocytes Absolute: 0.5 10*3/uL (ref 0.1–1.0)
Monocytes Relative: 6 %
Neutro Abs: 4.5 10*3/uL (ref 1.7–7.7)
Neutrophils Relative %: 54 %
Platelets: 254 10*3/uL (ref 150–400)
RBC: 5.04 MIL/uL (ref 3.87–5.11)
RDW: 14.2 % (ref 11.5–15.5)
WBC: 8.3 10*3/uL (ref 4.0–10.5)
nRBC: 0 % (ref 0.0–0.2)

## 2021-05-06 LAB — COMPREHENSIVE METABOLIC PANEL
ALT: 19 U/L (ref 0–44)
AST: 20 U/L (ref 15–41)
Albumin: 4.2 g/dL (ref 3.5–5.0)
Alkaline Phosphatase: 93 U/L (ref 38–126)
Anion gap: 16 — ABNORMAL HIGH (ref 5–15)
BUN: 17 mg/dL (ref 8–23)
CO2: 23 mmol/L (ref 22–32)
Calcium: 9.7 mg/dL (ref 8.9–10.3)
Chloride: 98 mmol/L (ref 98–111)
Creatinine, Ser: 1.07 mg/dL — ABNORMAL HIGH (ref 0.44–1.00)
GFR, Estimated: 59 mL/min — ABNORMAL LOW (ref >60)
Glucose, Bld: 130 mg/dL — ABNORMAL HIGH (ref 70–99)
Potassium: 3.6 mmol/L (ref 3.5–5.1)
Sodium: 137 mmol/L (ref 135–145)
Total Bilirubin: 0.5 mg/dL (ref 0.3–1.2)
Total Protein: 7.8 g/dL (ref 6.5–8.1)

## 2021-05-06 NOTE — Progress Notes (Signed)
Pt here for follow up. Pt reports occasional sharp pain to breasts. Pt requesting breast exam, gown provided.

## 2021-05-06 NOTE — Progress Notes (Signed)
Hematology Oncology Progress Note   Clinic Day:  05/06/2021  Referring physician: Glean Hess, MD  Chief Complaint: Donna Riley is a 60 y.o. female presents for follow-up of stage IIA left breast cancer and rectal neuroendocrine carcinoma.  PERTINENT ONCOLOGY HISTORY Patient previously followed up by Dr.Corcoran, patient switched care to me on 12/31/20 Extensive medical record review was performed by me  #stage IIA left breast cancer  08/27/2014 partial mastectomy and sentinel lymph node biopsy on 08/27/2014.  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  There was lymphovascular invasion.  One of 2 sentinel lymph nodes were positive  (focus of 2.8 mm).  Tumor was > 90% ER positive, > 90% PR positive, and Her2/neu negative.  Pathologic stage was [id number].   Bone scan on 09/16/2014 revealed abnormal focal uptake at the level of right L3 pedicle and L5 vertebral body.  Lumbar spine MRI on 09/27/2014 revealed no evidence of metastatic disease with lower lumbar facet arthritis.  Abdominal and pelvic CT scan on 09/24/2014 revealed hepatomegaly and no evidence of metastatic disease.     Adjuvant chemotherapy  4 cycles of Taxotere and Cytoxan (09/29/2014 - 12/09/2014) with Neulasta support. She received 50.4 Gy to the left breast from 01/12/2015 until 02/23/2015.    03/12/2015, started on letrozole but switched to Arimidex on 03/30/2015 secondary to diffuse joint aches.   Arimidex completed around 03/13/2020.  BCI testing on 08/20/2019 revealed an estimated risk of late recurrence between years 5-10 of 10.1% (95% CI: 5.6-14.3%).  She is not likely to benefit from extended endocrine therapy.   She had chronic left nipple discharge.  Left breast lumpectomy at the 11 o'clock position on 04/05/2016 revealed an intraductal papilloma with sclerosis and calcifications. There was fat necrosis with fibrosis and calcifications.  Pathology was negative for atypia and malignancy  Health maintenance Colonoscopy on 08/28/2015 revealed one 4 mm polyp in the rectum (tubular adenoma). EGD on 10/03/2019 revealed erythematous mucosa in the antrum. Pathology revealed antral and oxyntic mucosa with moderate chronic inactive gastritis. H. Pylori was negative. There was no intestinal metaplasia, dysplasia, and malignancy.   #Osteoporosis Bone density study on 09/15/2014 revealed osteopenia with a T-score of -2.1 at L1-L4.  Bone density on 01/17/2018 revealed osteoporosis with a T-score of -2.6 in the AP spine L1-L4.  Bone density on 03/03/2020 revealed osteopenia with a T score of -2.2 at the lumbar spine. She is on calcium and vitamin D.   #Lung cancer screening  She has a smoking history.  Low dose chest CT on 02/05/2020 revealed Lung-RADS 2S, benign appearance or behavior.  There was aortic atherosclerosis, in addition to left anterior descending coronary artery disease. There was mild diffuse bronchial wall thickening with mild centrilobular and paraseptal emphysema suggestive of underlying COPD.  08/09/2020 colonoscopy by Dr. Bonna Gains showed 5 polyp in ascending colon, descending colon and sigmoid colon resected and retrieved-pathology showed tubular adenoma, multiple fragments.  Negative for high-grade dysplasia and malignancy. Nodule in the sigmoid colon, biopsied.  Clip was placed.-Biopsy showed well-differentiated neuroendocrine tumor.  Grade 1.  08/27/2020, flexible sigmoidoscopy showed mucosal nodule in the sigmoid colon, tattooed.  Nonbleeding internal hemorrhoids.    09/10/2020  Flex Sigmoidoscopy/Lower EUS by Dr. Rush Landmark, and EMR  Pathology showed proximal rectum EMR Well differentiated neuroendocrine tumor-carcinoid.  Neuroendocrine tumor less 10.1 cm from cauterized margin. 1.2 x 1 x 0.8 cm.  Dr. Rush Landmark felt to be a complete resection and recommend  1 year follow-up lower EUS. 09/30/2020 CT abdomen pelvis showed  sutures near the rectosigmoid junction consistent with history.  Hepatic steatosis.  09/30/2020 Chromogranin A level at 49.6.  INTERVAL HISTORY Donna Riley is a 61 y.o. female who has above history reviewed by me today presents for follow up for history of breast cancer as well as rectal neuroendocrine carcinoma. Patient reports feeling well.  No new complaints. Denies any unintentional weight loss, night sweats, fever or chills.  No nausea vomiting diarrhea, blood in the stool. She noticed occasional sharp pain to her breasts, spontaneously resolved   Past Medical History:  Diagnosis Date   Arthritis    shoulders and neck   Breast cancer, left ([place]) 08/04/2014   a.) Bx (+) for invasive mammary carcinoma with DCIS; ER/PR (+), HER2/neu (-). (+) lymphovascular invasion with 1 of 2 lymph nodes being (+). Treated with lumpectomy + adjuvant chemoradiation.   Breast wound, left, sequela 06/23/2016   Cancer of gastrointestinal tract ([place]) 08/2020   COPD (chronic obstructive pulmonary disease) (HCC)    no inhalers   Coronary artery disease    a.) TTE and MPI normal in 10/2020; EF > 55%.   Diastolic dysfunction 25/42/7062   a.)  TTE 10/29/2020: EF >55%; G1DD   DOE (dyspnea on exertion)    Elevated LFTs    Endometrial polyp 02/03/2021   a.) Pelvic US --> 18 x 5 x 10 mm lenticular nodule in endrometrial canal; favored endometrial polyp vs. neoplasm. b.) Bx 02/23/2021 --> no dysplasia or malignancy; benign.   Fatigue 08/01/2016   GERD (gastroesophageal reflux disease)    occ-no meds   Gout    Hydradenitis 01/23/2015   Hyperlipidemia    Hypertension    Mastitis 10/15/2016   Neuroendocrine tumor 08/14/2020   a.) Rectal mass Bx (+) for well differentiated NET (grade I). IHC for cytokeratin AE1/AE3 and CD56 supports.   Positive PPD, treated    T2DM (type 2 diabetes mellitus) ([place]) 06/2014   Valvular regurgitation 10/29/2020   a.) TTE 10/29/2020: EF >55%; trivial AR/TR/PR, mild MR     Past Surgical History:  Procedure Laterality Date   BREAST BIOPSY Left 07/31/2014   [facility] and DCIS    BREAST BIOPSY Left 03/08/2016   INTRADUCTAL PAPILLOMA    BREAST LUMPECTOMY Left 07/2014   [facility], DCIS, clear margins, positive LN   BREAST LUMPECTOMY Left 04/05/2016   excision of intraductal papilloma, no malignancy or atypia   BREAST LUMPECTOMY WITH NEEDLE LOCALIZATION Left 04/05/2016   Procedure: BREAST LUMPECTOMY WITH NEEDLE LOCALIZATION;  Surgeon: Hubbard Robinson, MD;  Location: [facility];  Service: General;  Laterality: Left;   BREAST LUMPECTOMY WITH NEEDLE LOCALIZATION AND AXILLARY SENTINEL LYMPH NODE BX Left 08/27/14   CHOLECYSTECTOMY  09/07/2013   COLONOSCOPY WITH PROPOFOL N/A 08/28/2015   Procedure: COLONOSCOPY WITH PROPOFOL;  Surgeon: Lucilla Lame, MD;  Location: [place];  Service: Endoscopy;  Laterality: N/A;  Diabetic oral   COLONOSCOPY WITH PROPOFOL N/A 08/14/2020   Procedure: COLONOSCOPY WITH PROPOFOL;  Surgeon: Virgel Manifold, MD;  Location: [facility];  Service: Endoscopy;  Laterality: N/A;   ENDOSCOPIC MUCOSAL RESECTION N/A 09/10/2020   Procedure: ENDOSCOPIC MUCOSAL RESECTION;  Surgeon: Rush Landmark Telford Nab., MD;  Location: [place];  Service: Gastroenterology;  Laterality: N/A;   ESOPHAGOGASTRODUODENOSCOPY  2014   gastritis   ESOPHAGOGASTRODUODENOSCOPY (EGD) WITH PROPOFOL N/A 04/16/2019   Procedure: ESOPHAGOGASTRODUODENOSCOPY (EGD) WITH PROPOFOL;  Surgeon: Virgel Manifold, MD;  Location: [facility];  Service: Endoscopy;  Laterality:  N/A;   ESOPHAGOGASTRODUODENOSCOPY (EGD) WITH PROPOFOL N/A 10/03/2019   Procedure: ESOPHAGOGASTRODUODENOSCOPY (EGD) WITH PROPOFOL;  Surgeon: Virgel Manifold, MD;  Location: [facility];  Service: Endoscopy;  Laterality: N/A;   EUS N/A 09/10/2020   Procedure: LOWER ENDOSCOPIC ULTRASOUND (EUS);  Surgeon: Irving Copas., MD;  Location: [place];  Service: Gastroenterology;  Laterality: N/A;    FLEXIBLE SIGMOIDOSCOPY N/A 08/27/2020   Procedure: FLEXIBLE SIGMOIDOSCOPY;  Surgeon: Virgel Manifold, MD;  Location: [facility];  Service: Endoscopy;  Laterality: N/A;   FLEXIBLE SIGMOIDOSCOPY N/A 09/10/2020   Procedure: FLEXIBLE SIGMOIDOSCOPY;  Surgeon: Rush Landmark Telford Nab., MD;  Location: [place];  Service: Gastroenterology;  Laterality: N/A;   FOREIGN BODY REMOVAL  09/10/2020   Procedure: FOREIGN BODY REMOVAL ;  Surgeon: Rush Landmark Telford Nab., MD;  Location: [place];  Service: Gastroenterology;;   HEMOSTASIS CLIP PLACEMENT  09/10/2020   Procedure: HEMOSTASIS CLIP PLACEMENT;  Surgeon: Irving Copas., MD;  Location: [place];  Service: Gastroenterology;;   HYSTEROSCOPY WITH D & C N/A 03/25/2021   Procedure: DILATATION AND CURETTAGE [place];  Surgeon: Malachy Mood, MD;  Location: [facility];  Service: Gynecology;  Laterality: N/A;   POLYPECTOMY  08/28/2015   Procedure: POLYPECTOMY INTESTINAL;  Surgeon: Lucilla Lame, MD;  Location: [place];  Service: Endoscopy;;  Rectal polyp   SUBMUCOSAL LIFTING INJECTION  09/10/2020   Procedure: SUBMUCOSAL LIFTING INJECTION;  Surgeon: Irving Copas., MD;  Location: [place];  Service: Gastroenterology;;   TUBAL LIGATION Bilateral    UMBILICAL HERNIA REPAIR N/A 08/09/2016   Procedure: HERNIA REPAIR UMBILICAL ADULT;  Surgeon: Olean Ree, MD;  Location: [facility];  Service: General;  Laterality: N/A;   VENTRAL HERNIA REPAIR N/A 04/10/2015   Procedure: HERNIA REPAIR VENTRAL ADULT;  Surgeon: Leonie Green, MD;  Location: [facility];  Service: General;  Laterality: N/A;    Family History  Problem Relation Age of Onset   Cancer Mother        Pancreatic   Cancer Father        Throat   Healthy Sister    Cancer Sister        liver   HIV Brother    Cancer Maternal Grandmother        Breast   Breast cancer Maternal Grandmother 75    Social History:  reports that she has been smoking  cigarettes. She has a 22.00 pack-year smoking history. She has never used smokeless tobacco. She reports current alcohol use of about 2.0 standard drinks per week. She reports that she does not use drugs.  She has cut back on smoking to 6 cigarettes daily. She lost her husband in 05/2018.  She lives in [place]. The patient is alone today.  Allergies: No Known Allergies  Current Medications: Current Outpatient Medications  Medication Sig Dispense Refill   acetaminophen (TYLENOL) 325 MG tablet Take 650 mg by mouth daily as needed for moderate pain.     Alcohol Swabs (B-D SINGLE USE SWABS REGULAR) PADS 1 each by Does not apply route 2 (two) times daily. 100 each 3   amLODipine (NORVASC) 10 MG tablet TAKE TWO TABLETS BY MOUTH ONCE DAILY 60 tablet 2   aspirin EC 81 MG tablet Take 81 mg by mouth daily.     atorvastatin (LIPITOR) 10 MG tablet TAKE ONE TABLET BY MOUTH AT BEDTIME 90 tablet 0   Blood Glucose Calibration (TRUE METRIX LEVEL 1) Low SOLN 1 each by In Vitro route daily as needed. 1 each 3  Blood Glucose Monitoring Suppl (TRUE METRIX METER) DEVI 1 each by Does not apply route daily. 100 Device 3   calcium-vitamin D (OSCAL WITH D) 500-200 MG-UNIT TABS tablet Take 1 tablet by mouth daily.     glucose blood (PRECISION QID TEST) test strip      hydrochlorothiazide (HYDRODIURIL) 25 MG tablet Take 1 tablet (25 mg total) by mouth daily. (Patient taking differently: Take 25 mg by mouth every morning.) 90 tablet 1   HYDROcodone-acetaminophen (NORCO/VICODIN) 5-325 MG tablet Take 1 tablet by mouth every 6 (six) hours as needed. 12 tablet 0   ibuprofen (ADVIL) 600 MG tablet Take 1 tablet (600 mg total) by mouth every 6 (six) hours as needed for mild pain or cramping. 30 tablet 0   irbesartan (AVAPRO) 300 MG tablet TAKE ONE TABLET BY MOUTH ONCE DAILY 90 tablet 0   metFORMIN (GLUCOPHAGE-XR) 500 MG 24 hr tablet TAKE ONE TABLET BY MOUTH EVERY MORNING WITH BREAKFAST 90 tablet 1   metoprolol succinate  (TOPROL-XL) 25 MG 24 hr tablet Take 1 tablet by mouth daily.     Multiple Vitamins-Minerals (MULTIVITAMIN WITH MINERALS) tablet Take 1 tablet by mouth daily.     No current facility-administered medications for this visit.   Review of Systems  Constitutional:  Negative for chills, diaphoresis, fever, malaise/fatigue and weight loss (up 2 lbs).  HENT: Negative.  Negative for congestion, ear discharge, ear pain, hearing loss, nosebleeds, sinus pain, sore throat and tinnitus.   Eyes: Negative.  Negative for blurred vision and double vision.  Respiratory: Negative.  Negative for cough, hemoptysis, sputum production, shortness of breath and wheezing.   Cardiovascular:  Negative for chest pain, palpitations, orthopnea, leg swelling and PND.       Hypertension.  Gastrointestinal: Negative.  Negative for abdominal pain, blood in stool, constipation, diarrhea, heartburn, melena, nausea and vomiting.  Genitourinary: Negative.  Negative for dysuria, frequency, hematuria and urgency.  Musculoskeletal:  Positive for back pain (chronic lower back cramps) and joint pain (right shoulder, limited ROM). Negative for falls, myalgias and neck pain.        Intermittent soreness around previous lumpectomy sites.  Skin:  Negative for itching and rash.  Neurological: Negative.  Negative for dizziness, tremors, sensory change, speech change, focal weakness, weakness and headaches.  Endo/Heme/Allergies: Negative.  Does not bruise/bleed easily.  Psychiatric/Behavioral: Negative.  Negative for depression and memory loss. The patient is not nervous/anxious and does not have insomnia.   All other systems reviewed and are negative.    Vitals Blood pressure (!) 152/71, pulse 81, temperature 97.7 F (36.5 C), resp. rate 18, weight 199 lb 3.2 oz (90.4 kg).  Performance status (ECOG): 1 Physical Exam Vitals and nursing note reviewed.  Constitutional:      General: She is not in acute distress.    Appearance: She is  well-developed. She is not diaphoretic.  Eyes:     General: No scleral icterus.    Conjunctiva/sclera: Conjunctivae normal.  Cardiovascular:     Rate and Rhythm: Normal rate and regular rhythm.  Pulmonary:     Effort: Pulmonary effort is normal.     Breath sounds: Normal breath sounds.  Abdominal:     General: Abdomen is flat.  Neurological:     Mental Status: She is alert and oriented to person, place, and time.  Psychiatric:        Mood and Affect: Mood normal.   Breast exam was performed in seated and lying down position. History of left lumpectomy with  a well-healed surgical scar.  There is significant tissue thickening at the site of lumpectomy/above and to the right of the nipple.  No palpable mass of right breast.  No palpable axillary lymphadenopathy bilaterally. Tender with palpation of both breast-this is chronic for her.  RADIOGRAPHIC STUDIES: I have personally reviewed the radiological images as listed and agreed with the findings in the report. DG Chest 2 View  Result Date: 04/01/2021 CLINICAL DATA:  Chills and fever EXAM: CHEST - 2 VIEW COMPARISON:  Chest x-ray 10/15/2016, CT chest 02/16/2021 FINDINGS: The heart and mediastinal contours are unchanged. No focal consolidation. No pulmonary edema. No pleural effusion. No pneumothorax. No acute osseous abnormality. Vascular clips overlie the left axilla and hemithorax. IMPRESSION: No active cardiopulmonary disease. Electronically Signed   By: Iven Finn M.D.   On: 04/01/2021 17:52   CT CHEST LUNG CA SCREEN LOW DOSE W/O CM  Result Date: 02/16/2021 CLINICAL DATA:  Current smoker with 30 pack-year history EXAM: CT CHEST WITHOUT CONTRAST LOW-DOSE FOR LUNG CANCER SCREENING TECHNIQUE: Multidetector CT imaging of the chest was performed following the standard protocol without IV contrast. COMPARISON:  CT lung cancer screening dated February 05, 2020 FINDINGS: Cardiovascular: Normal heart size. No pericardial effusion. No  significant coronary artery calcifications. Atherosclerotic disease of the thoracic aorta. Mediastinum/Nodes: Small hiatal hernia. Pathologically enlarged lymph nodes seen in the chest. Lungs/Pleura: Central airways are patent. Subpleural reticular opacities of the anterior left upper lobe, likely postradiation change. Consolidation, pleural effusion or pneumothorax scattered calcified pulmonary nodules, likely sequela of prior granulomatous infection. Mild centrilobular and paraseptal emphysema. Stable solid pulmonary nodule of the left upper lobe measuring 3 mm in mean diameter on series 3, image 70. Upper Abdomen: No acute abnormality. Musculoskeletal: No chest wall mass or suspicious bone lesions identified. IMPRESSION: Lung-RADS 2, benign appearance or behavior. Continue annual screening with low-dose chest CT without contrast in 12 months. Aortic Atherosclerosis (ICD10-I70.0). Electronically Signed   By: Yetta Glassman M.D.   On: 02/16/2021 13:50     Labs are reviewed and discussed with patient. CBC    Component Value Date/Time   WBC 8.3 05/06/2021 0851   RBC 5.04 05/06/2021 0851   HGB 13.8 05/06/2021 0851   HGB 13.6 05/26/2017 0928   HCT 42.7 05/06/2021 0851   HCT 40.7 05/26/2017 0928   PLT 254 05/06/2021 0851   PLT 231 05/26/2017 0928   MCV 84.7 05/06/2021 0851   MCV 80 05/26/2017 0928   MCV 83 09/09/2014 1106   MCH 27.4 05/06/2021 0851   MCHC 32.3 05/06/2021 0851   RDW 14.2 05/06/2021 0851   RDW 14.6 05/26/2017 0928   RDW 13.7 09/09/2014 1106   LYMPHSABS 3.1 05/06/2021 0851   LYMPHSABS 2.5 05/26/2017 0928   LYMPHSABS 2.7 09/09/2014 1106   MONOABS 0.5 05/06/2021 0851   MONOABS 0.4 09/09/2014 1106   EOSABS 0.1 05/06/2021 0851   EOSABS 0.1 05/26/2017 0928   EOSABS 0.2 09/09/2014 1106   BASOSABS 0.1 05/06/2021 0851   BASOSABS 0.0 05/26/2017 0928   BASOSABS 0.0 09/09/2014 1106   CMP Latest Ref Rng & Units 05/06/2021 04/01/2021 12/31/2020  Glucose 70 - 99 mg/dL 130(H) 259(H)  124(H)  BUN 8 - 23 mg/dL _0 Creatinine 0.44 - 1.00 mg/dL 1.07(H) 1.02(H) 1.04(H)  Sodium 135 - 145 mmol/L 137 136 133(L)  Potassium 3.5 - 5.1 mmol/L 3.6 3.3(L) 3.6  Chloride 98 - 111 mmol/L 98 101 101  CO2 22 - 32 mmol/L _1 Calcium 8.9 -  10.3 mg/dL 9.7 8.9 8.9  Total Protein 6.5 - 8.1 g/dL 7.8 - 7.7  Total Bilirubin 0.3 - 1.2 mg/dL 0.5 - 0.5  Alkaline Phos 38 - 126 U/L 93 - 85  AST 15 - 41 U/L 20 - 20  ALT 0 - 44 U/L 19 - 21      Assessment/ Plan: 1. Malignant neoplasm of left breast in female, estrogen receptor positive, unspecified site of breast ([place])   2. Primary neuroendocrine carcinoma of rectum ([place])   3. Smoking history   4. Osteopenia, unspecified location   5. Breast pain, right    #History of Stage IIA LEFT breast cancer Finished 5 years of endocrine therapy on 03/13/2020 [facility] testing on 08/20/2019 revealed no benefit from extended endocrine therapy Labs are reviewed and discussed with patient to Patient is due for mammogram in 2022 February.  I will obtain. Bilateral breast examination was not remarkable.  Patient has had work-up previously.  Reassurance was provided.  #Stage I neuroendocrine carcinoma of the rectum. No T stage on pathology Status post resection.  Negative though close margin point 1 mm. Baseline CT abdomen pelvis was negative. 01/12/2021, MRI pelvis without contrast was a limited assessment due to stool failed distal sigmoid.  No gross lesion.  No pelvic adenopathy. Patient will need to see Dr.  Rush Landmark in May 2023 for repeat EUS. Role of chromogranin A (CgA) in surveillance is controversial as the contribution to detection of recurrent disease is limited  #  Osteopenia Bone density on 03/03/2020 revealed osteopenia with a T-score of -2.2 (improved).  Continue calcium and vitamin D.  #Tobacco use.  Extensive smoking history Follow-up with [facility] lung cancer screening program. Recent CT chest negative for mass  #With patient's  personal history and family history of cancer, will refer to genetic testing.  Discussed with genetic counselor.  Follow-up with lab MD 4 months, CBC CMP   I discussed the assessment and treatment plan with the patient.  The patient was provided an opportunity to ask questions and all were answered.  The patient agreed with the plan and demonstrated an understanding of the instructions.  The patient was advised to call back if the symptoms worsen or if the condition fails to improve as anticipated.  Earlie Server, MD, PhD  05/06/2021

## 2021-05-19 ENCOUNTER — Encounter: Payer: Self-pay | Admitting: Licensed Clinical Social Worker

## 2021-05-19 ENCOUNTER — Inpatient Hospital Stay: Payer: Medicare Other

## 2021-05-19 ENCOUNTER — Inpatient Hospital Stay: Payer: Medicare Other | Admitting: Licensed Clinical Social Worker

## 2021-05-19 ENCOUNTER — Other Ambulatory Visit: Payer: Self-pay

## 2021-05-19 DIAGNOSIS — Z803 Family history of malignant neoplasm of breast: Secondary | ICD-10-CM | POA: Diagnosis not present

## 2021-05-19 DIAGNOSIS — C50912 Malignant neoplasm of unspecified site of left female breast: Secondary | ICD-10-CM | POA: Diagnosis not present

## 2021-05-19 DIAGNOSIS — Z8051 Family history of malignant neoplasm of kidney: Secondary | ICD-10-CM | POA: Diagnosis not present

## 2021-05-19 DIAGNOSIS — Z800 Family history of malignant neoplasm of digestive organs: Secondary | ICD-10-CM | POA: Diagnosis not present

## 2021-05-19 DIAGNOSIS — D3A8 Other benign neuroendocrine tumors: Secondary | ICD-10-CM

## 2021-05-19 NOTE — Progress Notes (Signed)
REFERRING PROVIDER: Earlie Server, MD [place],  [place] [address]  PRIMARY PROVIDER:  Glean Hess, MD  PRIMARY REASON FOR VISIT:  1. Family history of breast cancer   2. Family history of pancreatic cancer   3. Family history of kidney cancer   4. Family history of throat cancer   5. Malignant neoplasm of left breast in female, estrogen receptor positive, unspecified site of breast ([place])   6. Benign neuroendocrine tumor of sigmoid colon      HISTORY OF PRESENT ILLNESS:   [name], a 60 y.o. female, was seen for a [place] cancer genetics consultation at the request of Dr. Tasia Catchings due to a personal and family history of cancer.  [name] presents to clinic today to discuss the possibility of a hereditary predisposition to cancer, genetic testing, and to further clarify her future cancer risks, as well as potential cancer risks for family members.   A the age of 35, [name] was diagnosed with invasive ductal carcinoma of the left breast. The treatment plan included partial mastectomy, adjuvant chemotherapy and adjuvant endocrine therapy.  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   CANCER HISTORY:  Oncology History   No history exists.     RISK FACTORS:  Menarche was at age 64.  First live birth at age 47.  OCP use for approximately 0 years.  Ovaries intact: yes.  Hysterectomy: no.  Menopausal status: postmenopausal.  HRT use: 0 years. Colonoscopy: yes;  polyps . Mammogram within the last year: yes. Up to date with pelvic exams: yes.  Past Medical History:  Diagnosis Date   Arthritis    shoulders and neck   Breast cancer, left ([place]) 08/04/2014   a.) Bx (+) for invasive mammary carcinoma with DCIS; ER/PR (+), HER2/neu (-). (+) lymphovascular invasion with 1 of 2 lymph nodes being (+). Treated with lumpectomy + adjuvant chemoradiation.   Breast wound, left, sequela 06/23/2016   Cancer of gastrointestinal tract ([place]) 08/2020    COPD (chronic obstructive pulmonary disease) (HCC)    no inhalers   Coronary artery disease    a.) TTE and MPI normal in 10/2020; EF > 55%.   Diastolic dysfunction 12/45/8099   a.)  TTE 10/29/2020: EF >55%; G1DD   DOE (dyspnea on exertion)    Elevated LFTs    Endometrial polyp 02/03/2021   a.) Pelvic US --> 18 x 5 x 10 mm lenticular nodule in endrometrial canal; favored endometrial polyp vs. neoplasm. b.) Bx 02/23/2021 --> no dysplasia or malignancy; benign.   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 IHC for cytokeratin AE1/AE3 and CD56 supports.   Positive PPD, treated    T2DM (type 2 diabetes mellitus) ([place]) 06/2014   Valvular regurgitation 10/29/2020   a.) TTE 10/29/2020: EF >55%; trivial AR/TR/PR, mild MR    Past Surgical History:  Procedure Laterality Date   BREAST BIOPSY Left 07/31/2014   [facility] and DCIS    BREAST BIOPSY Left 03/08/2016   INTRADUCTAL PAPILLOMA    BREAST LUMPECTOMY  Left 07/2014   [facility], DCIS, clear margins, positive LN   BREAST LUMPECTOMY Left 04/05/2016   excision of intraductal papilloma, no malignancy or atypia   BREAST LUMPECTOMY WITH NEEDLE LOCALIZATION Left 04/05/2016   Procedure: BREAST LUMPECTOMY WITH NEEDLE LOCALIZATION;  Surgeon: Hubbard Robinson, MD;  Location: [facility];  Service: General;  Laterality: Left;   BREAST LUMPECTOMY WITH NEEDLE LOCALIZATION AND AXILLARY SENTINEL LYMPH NODE BX Left 08/27/14   CHOLECYSTECTOMY  09/07/2013   COLONOSCOPY WITH PROPOFOL N/A 08/28/2015   Procedure: COLONOSCOPY WITH PROPOFOL;  Surgeon: Lucilla Lame, MD;  Location: [place];  Service:  Endoscopy;  Laterality: N/A;  Diabetic oral   COLONOSCOPY WITH PROPOFOL N/A 08/14/2020   Procedure: COLONOSCOPY WITH PROPOFOL;  Surgeon: Virgel Manifold, MD;  Location: [facility];  Service: Endoscopy;  Laterality: N/A;   ENDOSCOPIC MUCOSAL RESECTION N/A 09/10/2020   Procedure: ENDOSCOPIC MUCOSAL RESECTION;  Surgeon: Rush Landmark Telford Nab., MD;  Location: [place];  Service: Gastroenterology;  Laterality: N/A;   ESOPHAGOGASTRODUODENOSCOPY  2014   gastritis   ESOPHAGOGASTRODUODENOSCOPY (EGD) WITH PROPOFOL N/A 04/16/2019   Procedure: ESOPHAGOGASTRODUODENOSCOPY (EGD) WITH PROPOFOL;  Surgeon: Virgel Manifold, MD;  Location: [facility];  Service: Endoscopy;  Laterality: N/A;   ESOPHAGOGASTRODUODENOSCOPY (EGD) WITH PROPOFOL N/A 10/03/2019   Procedure: ESOPHAGOGASTRODUODENOSCOPY (EGD) WITH PROPOFOL;  Surgeon: Virgel Manifold, MD;  Location: [facility];  Service: Endoscopy;  Laterality: N/A;   EUS N/A 09/10/2020   Procedure: LOWER ENDOSCOPIC ULTRASOUND (EUS);  Surgeon: Irving Copas., MD;  Location: [place];  Service: Gastroenterology;  Laterality: N/A;   FLEXIBLE SIGMOIDOSCOPY N/A 08/27/2020   Procedure: FLEXIBLE SIGMOIDOSCOPY;  Surgeon: Virgel Manifold, MD;  Location: [facility];  Service: Endoscopy;  Laterality: N/A;   FLEXIBLE SIGMOIDOSCOPY N/A 09/10/2020   Procedure: FLEXIBLE SIGMOIDOSCOPY;  Surgeon: Rush Landmark Telford Nab., MD;  Location: [place];  Service: Gastroenterology;  Laterality: N/A;   FOREIGN BODY REMOVAL  09/10/2020   Procedure: FOREIGN BODY REMOVAL ;  Surgeon: Rush Landmark Telford Nab., MD;  Location: [place];  Service: Gastroenterology;;   HEMOSTASIS CLIP PLACEMENT  09/10/2020   Procedure: HEMOSTASIS CLIP PLACEMENT;  Surgeon: Irving Copas., MD;  Location: [place];  Service: Gastroenterology;;   HYSTEROSCOPY WITH D & C N/A 03/25/2021   Procedure: DILATATION AND CURETTAGE [place];  Surgeon: Malachy Mood,  MD;  Location: [facility];  Service: Gynecology;  Laterality: N/A;   POLYPECTOMY  08/28/2015   Procedure: POLYPECTOMY INTESTINAL;  Surgeon: Lucilla Lame, MD;  Location: [place];  Service: Endoscopy;;  Rectal polyp   SUBMUCOSAL LIFTING INJECTION  09/10/2020   Procedure: SUBMUCOSAL LIFTING INJECTION;  Surgeon: Irving Copas., MD;  Location: [place];  Service: Gastroenterology;;   TUBAL LIGATION Bilateral    UMBILICAL HERNIA REPAIR N/A 08/09/2016   Procedure: HERNIA REPAIR UMBILICAL ADULT;  Surgeon: Olean Ree, MD;  Location: [facility];  Service: General;  Laterality: N/A;   VENTRAL HERNIA REPAIR N/A 04/10/2015   Procedure: HERNIA REPAIR VENTRAL ADULT;  Surgeon: Leonie Green, MD;  Location: [facility];  Service: General;  Laterality: N/A;    Social History   Socioeconomic History   Marital status: Widowed    Spouse name: Not on file   Number of children: 4   Years of education: Not on file   Highest education level: 10th grade  Occupational History   Occupation: Disabled  Tobacco Use   Smoking status: Every Day    Packs/day: 0.50    Years: 44.00    Pack years: 22.00  Types: Cigarettes   Smokeless tobacco: Never  Vaping Use   Vaping Use: Never used  Substance and Sexual Activity   Alcohol use: Yes    Alcohol/week: 2.0 standard drinks    Types: 2 Cans of beer per week    Comment: weekends - beer    Drug use: No   Sexual activity: Not Currently  Other Topics Concern   Not on file  Social History Narrative   Pt lives alone, son lives 2 doors down.   Social Determinants of Health   Financial Resource Strain: Low Risk    Difficulty of Paying Living Expenses: Not very hard  Food Insecurity: No Food Insecurity   Worried About Charity fundraiser in the Last Year: Never true   Ran Out of Food in the Last Year: Never true  Transportation Needs: No Transportation Needs   Lack of Transportation (Medical): No   Lack of Transportation (Non-Medical): No   Physical Activity: Inactive   Days of Exercise per Week: 0 days   Minutes of Exercise per Session: 0 min  Stress: No Stress Concern Present   Feeling of Stress : Only a little  Social Connections: Moderately Isolated   Frequency of Communication with Friends and Family: More than three times a week   Frequency of Social Gatherings with Friends and Family: Three times a week   Attends Religious Services: More than 4 times per year   Active Member of Clubs or Organizations: No   Attends Archivist Meetings: Never   Marital Status: Widowed     FAMILY HISTORY:  We obtained a detailed, 4-generation family history.  Significant diagnoses are listed below: Family History  Problem Relation Age of Onset   Cancer Mother 46       Pancreatic   Cancer Father        Throat dx 24s   Healthy Sister    Cancer Sister 55       unk metastatic   Kidney cancer Sister 50   HIV Brother    Breast cancer Maternal Grandmother 75   Breast cancer Cousin        d. 77s   Ms. Blakeman has 4 sons (22, 57, 74, 15), no cancers. She has 2 brothers (63, 59) and 2 sisters (80, 36). Her oldest sister has metastatic cancer, unknown primary. Her other sister was diagnosed with kidney cancer at 83.   Ms. Gurr mother had pancreatic cancer at 83 and died at 38. She has 3 maternal uncles, limited info, all passed over 13. She has 2 maternal cousins who had cancer: one had breast cancer in her 55s and passed, the other had cervical cancer in her 91s and passed. Maternal grandmother had breast cancer in her 78s and died in her 63s. No information about maternal grandfather.  Ms. Kirsten father died of throat cancer in his 71s, he had history of smoking. Patient had 3 paternal uncles, no cancers, limited information. Paternal grandparents passed in their 37s.  Ms. Bellisario is unaware of previous family history of genetic testing for hereditary cancer risks.There is no reported Ashkenazi Jewish ancestry. There is  no known consanguinity.  GENETIC COUNSELING ASSESSMENT: Ms. Kable is a 60 y.o. female with a personal and family history of cancer which is somewhat suggestive of a hereditary cancer syndrome and predisposition to cancer. We, therefore, discussed and recommended the following at today's visit.   DISCUSSION: We discussed that approximately 10% of breast cancer is hereditary. Most cases of hereditary  breast cancer are associated with BRCA1/BRCA2 genes, although there are other genes associated with hereditary breast/other cancers as well. Cancers and risks are gene specific.  We discussed that testing is beneficial for several reasons including  knowing about other cancer risks, identifying potential screening and risk-reduction options that may be appropriate, and to understand if other family members could be at risk for cancer and allow them to undergo genetic testing.   We reviewed the characteristics, features and inheritance patterns of hereditary cancer syndromes. We also discussed genetic testing, including the appropriate family members to test, the process of testing, insurance coverage and turn-around-time for results. We discussed the implications of a negative, positive and/or variant of uncertain significant result. We recommended Ms. Folkerts pursue genetic testing for the [facility] Multi-Cancer+RNA gene panel.   The Multi-Cancer Panel + RNA offered by [facility] includes sequencing and/or deletion duplication testing of the following 84 genes: AIP, ALK, APC, ATM, AXIN2,BAP1,  BARD1, BLM, BMPR1A, BRCA1, BRCA2, BRIP1, CASR, CDC73, CDH1, CDK4, CDKN1B, CDKN1C, CDKN2A (p14ARF), CDKN2A (p16INK4a), CEBPA, CHEK2, CTNNA1, DICER1, DIS3L2, EGFR (c.2369C>T, p.Thr790Met variant only), EPCAM (Deletion/duplication testing only), FH, FLCN, GATA2, GPC3, GREM1 (Promoter region deletion/duplication testing only), HOXB13 (c.251G>A, p.Gly84Glu), HRAS, KIT, MAX, MEN1, MET, MITF (c.952G>A, p.Glu318Lys variant only), MLH1,  MSH2, MSH3, MSH6, MUTYH, NBN, NF1, NF2, NTHL1, PALB2, PDGFRA, PHOX2B, PMS2, POLD1, POLE, POT1, PRKAR1A, PTCH1, PTEN, RAD50, RAD51C, RAD51D, RB1, RECQL4, RET, RUNX1, SDHAF2, SDHA (sequence changes only), SDHB, SDHC, SDHD, SMAD4, SMARCA4, SMARCB1, SMARCE1, STK11, SUFU, TERC, TERT, TMEM127, TP53, TSC1, TSC2, VHL, WRN and WT1.  Based on Ms. Brick's personal and family history of cancer, she meets medical criteria for genetic testing. Despite that she meets criteria, she may still have an out of pocket cost. We discussed that if her out of pocket cost for testing is over $100, the laboratory will call and confirm whether she wants to proceed with testing.  If the out of pocket cost of testing is less than $100 she will be billed by the genetic testing laboratory.   PLAN: After considering the risks, benefits, and limitations, Ms. Corrigan provided informed consent to pursue genetic testing and the blood sample was sent to [facility] for analysis of the Multi-Cancer+RNA panel. Results should be available within approximately 2-3 weeks' time, at which point they will be disclosed by telephone to Ms. Cates, as will any additional recommendations warranted by these results. Ms. Cifelli will receive a summary of her genetic counseling visit and a copy of her results once available. This information will also be available in Epic.   Ms. Frysinger questions were answered to her satisfaction today. Our contact information was provided should additional questions or concerns arise. Thank you for the referral and allowing [place] to share in the care of your patient.   Faith Rogue, MS, [facility] Genetic Counselor [email or web address] Phone: [phone]  The patient was seen for a total of 25 minutes in face-to-face genetic counseling.  Patient was seen alone.  Dr. Grayland Ormond was available for discussion regarding this case.   _______________________________________________________________________ For  Office Staff:  Number of people involved in session: 1 Was an Intern/ student involved with case: no

## 2021-06-01 ENCOUNTER — Other Ambulatory Visit: Payer: Self-pay | Admitting: Internal Medicine

## 2021-06-01 ENCOUNTER — Telehealth: Payer: Self-pay

## 2021-06-01 DIAGNOSIS — I25118 Atherosclerotic heart disease of native coronary artery with other forms of angina pectoris: Secondary | ICD-10-CM | POA: Diagnosis not present

## 2021-06-01 DIAGNOSIS — I70219 Atherosclerosis of native arteries of extremities with intermittent claudication, unspecified extremity: Secondary | ICD-10-CM | POA: Diagnosis not present

## 2021-06-01 DIAGNOSIS — I10 Essential (primary) hypertension: Secondary | ICD-10-CM | POA: Diagnosis not present

## 2021-06-01 DIAGNOSIS — E782 Mixed hyperlipidemia: Secondary | ICD-10-CM | POA: Diagnosis not present

## 2021-06-01 DIAGNOSIS — I700 Atherosclerosis of aorta: Secondary | ICD-10-CM | POA: Diagnosis not present

## 2021-06-01 DIAGNOSIS — R9431 Abnormal electrocardiogram [ECG] [EKG]: Secondary | ICD-10-CM | POA: Diagnosis not present

## 2021-06-01 NOTE — Telephone Encounter (Signed)
Copied from [place] [id number]. Topic: General - Other >> Jun 01, 2021 11:40 AM Tessa Lerner A wrote: Reason for CRM: Donna Riley with Dr. Alveria Apley office has called to share that the patient has requested a prescription to help with their anxiety due to the recent loss of a family member during their visit today 06/01/21  Dr. Nehemiah Massed declined to prescribe the medication  The patient was notified that their PCP would be contacted

## 2021-06-01 NOTE — Telephone Encounter (Signed)
Please call pt to schedule an appt for anxiety. Can not get any new medications without being seen.  KP

## 2021-06-02 NOTE — Telephone Encounter (Signed)
Requested Prescriptions  Pending Prescriptions Disp Refills   irbesartan (AVAPRO) 300 MG tablet [Pharmacy Med Name: irbesartan 300 mg tablet] 90 tablet 0    Sig: TAKE ONE TABLET BY MOUTH ONCE DAILY     Cardiovascular:  Angiotensin Receptor Blockers Failed - 06/01/2021  3:06 PM      Failed - Cr in normal range and within 180 days    Creatinine  Date Value Ref Range Status  09/09/2014 0.67 mg/dL Final    Comment:    0.44-1.00 NOTE: New Reference Range  08/05/14    Creatinine, Ser  Date Value Ref Range Status  05/06/2021 1.07 (H) 0.44 - 1.00 mg/dL Final         Failed - Last BP in normal range    BP Readings from Last 1 Encounters:  05/06/21 (!) 152/71         Passed - K in normal range and within 180 days    Potassium  Date Value Ref Range Status  05/06/2021 3.6 3.5 - 5.1 mmol/L Final  09/09/2014 4.3 mmol/L Final    Comment:    3.5-5.1 NOTE: New Reference Range  08/05/14          Passed - Patient is not pregnant      Passed - Valid encounter within last 6 months    Recent Outpatient Visits          2 months ago Type II diabetes mellitus with complication [facility])   [facility] Glean Hess, MD   5 months ago [facility] Glean Hess, MD   7 months ago Type II diabetes mellitus with complication [facility])   [facility] Glean Hess, MD   7 months ago Gross hematuria   [facility] Glean Hess, MD   10 months ago Type II diabetes mellitus with complication [facility])   [facility] Glean Hess, MD      Future Appointments            In 1 month Army Melia, Jesse Sans, MD [facility], [facility]

## 2021-06-10 ENCOUNTER — Telehealth: Payer: Self-pay | Admitting: Licensed Clinical Social Worker

## 2021-06-10 ENCOUNTER — Ambulatory Visit: Payer: Self-pay | Admitting: Licensed Clinical Social Worker

## 2021-06-10 ENCOUNTER — Encounter: Payer: Self-pay | Admitting: Licensed Clinical Social Worker

## 2021-06-10 DIAGNOSIS — E118 Type 2 diabetes mellitus with unspecified complications: Secondary | ICD-10-CM | POA: Diagnosis not present

## 2021-06-10 DIAGNOSIS — M2042 Other hammer toe(s) (acquired), left foot: Secondary | ICD-10-CM | POA: Diagnosis not present

## 2021-06-10 DIAGNOSIS — Z1379 Encounter for other screening for genetic and chromosomal anomalies: Secondary | ICD-10-CM | POA: Insufficient documentation

## 2021-06-10 DIAGNOSIS — E119 Type 2 diabetes mellitus without complications: Secondary | ICD-10-CM | POA: Diagnosis not present

## 2021-06-10 DIAGNOSIS — M2041 Other hammer toe(s) (acquired), right foot: Secondary | ICD-10-CM | POA: Diagnosis not present

## 2021-06-10 NOTE — Telephone Encounter (Signed)
Revealed negative genetic testing.  Revealed that a VUS in MSH3 was identified. This normal result is reassuring and indicates that it is unlikely Donna Riley's cancer is due to a hereditary cause.  It is unlikely that there is an increased risk of another cancer due to a mutation in one of these genes.  However, genetic testing is not perfect, and cannot definitively rule out a hereditary cause.  It will be important for her to keep in contact with genetics to learn if any additional testing may be needed in the future.

## 2021-06-10 NOTE — Progress Notes (Signed)
HPI:  [name] was previously seen in the [facility] due to a personal and family history of cancer and concerns regarding a hereditary predisposition to cancer. Please refer to our prior cancer genetics clinic note for more information regarding our discussion, assessment and recommendations, at the time. [name] recent genetic test results were disclosed to her, as were recommendations warranted by these results. These results and recommendations are discussed in more detail below.  CANCER HISTORY:  Oncology History   No history exists.    FAMILY HISTORY:  We obtained a detailed, 4-generation family history.  Significant diagnoses are listed below: Family History  Problem Relation Age of Onset   Cancer Mother 41       Pancreatic   Cancer Father        Throat dx 52s   Healthy Sister    Cancer Sister 27       unk metastatic   Kidney cancer Sister 64   HIV Brother    Breast cancer Maternal Grandmother 75   Breast cancer Cousin        d. 3s   [name] has 4 sons (52, 61, 61, 24), no cancers. She has 2 brothers (63, 35) and 2 sisters (20, 73). Her oldest sister has metastatic cancer, unknown primary. Her other sister was diagnosed with kidney cancer at 31.    [name] mother had pancreatic cancer at 24 and died at 68. She has 3 maternal uncles, limited info, all passed over 13. She has 2 maternal cousins who had cancer: one had breast cancer in her 39s and passed, the other had cervical cancer in her 54s and passed. Maternal grandmother had breast cancer in her 45s and died in her 77s. No information about maternal grandfather.   [name] father died of throat cancer in his 20s, he had history of smoking. Patient had 3 paternal uncles, no cancers, limited information. Paternal grandparents passed in their 93s.   [name] is unaware of previous family history of genetic testing for hereditary cancer risks.There is no reported Ashkenazi Jewish  ancestry. There is no known consanguinity.  GENETIC TEST RESULTS: Genetic testing reported out on 06/09/2021 through the [facility] Multi-Cancer+RNA cancer panel found no pathogenic mutations.   The Multi-Cancer Panel + RNA offered by [facility] includes sequencing and/or deletion duplication testing of the following 84 genes: AIP, ALK, APC, ATM, AXIN2,BAP1,  BARD1, BLM, BMPR1A, BRCA1, BRCA2, BRIP1, CASR, CDC73, CDH1, CDK4, CDKN1B, CDKN1C, CDKN2A (p14ARF), CDKN2A (p16INK4a), CEBPA, CHEK2, CTNNA1, DICER1, DIS3L2, EGFR (c.2369C>T, p.Thr790Met variant only), EPCAM (Deletion/duplication testing only), FH, FLCN, GATA2, GPC3, GREM1 (Promoter region deletion/duplication testing only), HOXB13 (c.251G>A, p.Gly84Glu), HRAS, KIT, MAX, MEN1, MET, MITF (c.952G>A, p.Glu318Lys variant only), MLH1, MSH2, MSH3, MSH6, MUTYH, NBN, NF1, NF2, NTHL1, PALB2, PDGFRA, PHOX2B, PMS2, POLD1, POLE, POT1, PRKAR1A, PTCH1, PTEN, RAD50, RAD51C, RAD51D, RB1, RECQL4, RET, RUNX1, SDHAF2, SDHA (sequence changes only), SDHB, SDHC, SDHD, SMAD4, SMARCA4, SMARCB1, SMARCE1, STK11, SUFU, TERC, TERT, TMEM127, TP53, TSC1, TSC2, VHL, WRN and WT1.  The test report has been scanned into EPIC and is located under the Molecular Pathology section of the Results Review tab.  A portion of the result report is included below for reference.    We discussed that because current genetic testing is not perfect, it is possible there may be a gene mutation in one of these genes that current testing cannot detect, but that chance is small.  There could be another gene that has not yet been discovered,  or that we have not yet tested, that is responsible for the cancer diagnoses in the family. It is also possible there is a hereditary cause for the cancer in the family that [name] did not inherit and therefore was not identified in her testing.  Therefore, it is important to remain in touch with cancer genetics in the future so that we can continue to offer [name] the  most up to date genetic testing.   Genetic testing did identify a variant of uncertain significance (VUS) in the MSH3 gene called c.2016T>G.  At this time, it is unknown if this variant is associated with increased cancer risk or if this is a normal finding, but most variants such as this get reclassified to being inconsequential. It should not be used to make medical management decisions. With time, we suspect the lab will determine the significance of this variant, if any. If we do learn more about it we will try to contact [name] to discuss it further. However, it is important to stay in touch with [place] periodically and keep the address and phone number up to date.  ADDITIONAL GENETIC TESTING: We discussed with [name] that her genetic testing was fairly extensive.  If there are genes identified to increase cancer risk that can be analyzed in the future, we would be happy to discuss and coordinate this testing at that time.    CANCER SCREENING RECOMMENDATIONS: [name] test result is considered negative (normal).  This means that we have not identified a hereditary cause for her  personal and family history of cancer at this time. Most cancers happen by chance and this negative test suggests that her cancer may fall into this category.    While reassuring, this does not definitively rule out a hereditary predisposition to cancer. It is still possible that there could be genetic mutations that are undetectable by current technology. There could be genetic mutations in genes that have not been tested or identified to increase cancer risk.  Therefore, it is recommended she continue to follow the cancer management and screening guidelines provided by her oncology and primary healthcare provider.   An individual's cancer risk and medical management are not determined by genetic test results alone. Overall cancer risk assessment incorporates additional factors, including personal medical history,  family history, and any available genetic information that may result in a personalized plan for cancer prevention and surveillance.  RECOMMENDATIONS FOR FAMILY MEMBERS:  Relatives in this family might be at some increased risk of developing cancer, over the general population risk, simply due to the family history of cancer.  We recommended female relatives in this family have a yearly mammogram beginning at age 52, or 51 years younger than the earliest onset of cancer, an annual clinical breast exam, and perform monthly breast self-exams. Female relatives in this family should also have a gynecological exam as recommended by their primary provider.  All family members should be referred for colonoscopy starting at age 52.    It is also possible there is a hereditary cause for the cancer in [name]'s family that she did not inherit and therefore was not identified in her.  Based on [name]'s family history, we recommended maternal relatives have genetic counseling and testing. [name] will let us know if we can be of any assistance in coordinating genetic counseling and/or testing for these family members.  FOLLOW-UP: Lastly, we discussed with [name] that cancer genetics is a rapidly advancing field  and it is possible that new genetic tests will be appropriate for her and/or her family members in the future. We encouraged her to remain in contact with cancer genetics on an annual basis so we can update her personal and family histories and let her know of advances in cancer genetics that may benefit this family.   Our contact number was provided. [name] questions were answered to her satisfaction, and she knows she is welcome to call us at anytime with additional questions or concerns.   Faith Rogue, MS, [facility] Genetic Counselor [email or web address] Phone: [phone]

## 2021-06-11 ENCOUNTER — Telehealth: Payer: Self-pay

## 2021-06-11 NOTE — Telephone Encounter (Signed)
Pt had questions about when to f/u, I did let her know that she will be due for f/u with [facility] in April.... I did not see repeat colonoscopy for any sooner than 5 yrs

## 2021-06-22 ENCOUNTER — Other Ambulatory Visit: Payer: Self-pay | Admitting: Internal Medicine

## 2021-06-22 DIAGNOSIS — I10 Essential (primary) hypertension: Secondary | ICD-10-CM

## 2021-06-22 NOTE — Telephone Encounter (Signed)
Requested Prescriptions  Pending Prescriptions Disp Refills   hydrochlorothiazide (HYDRODIURIL) 25 MG tablet [Pharmacy Med Name: hydrochlorothiazide 25 mg tablet] 90 tablet 1    Sig: TAKE ONE TABLET BY MOUTH ONCE DAILY     Cardiovascular: Diuretics - Thiazide Failed - 06/22/2021  8:01 AM      Failed - Cr in normal range and within 360 days    Creatinine  Date Value Ref Range Status  09/09/2014 0.67 mg/dL Final    Comment:    0.44-1.00 NOTE: New Reference Range  08/05/14    Creatinine, Ser  Date Value Ref Range Status  05/06/2021 1.07 (H) 0.44 - 1.00 mg/dL Final         Failed - Last BP in normal range    BP Readings from Last 1 Encounters:  05/06/21 (!) 152/71         Passed - Ca in normal range and within 360 days    Calcium  Date Value Ref Range Status  05/06/2021 9.7 8.9 - 10.3 mg/dL Final   Calcium, Total  Date Value Ref Range Status  09/09/2014 9.1 mg/dL Final    Comment:    8.9-10.3 NOTE: New Reference Range  08/05/14          Passed - K in normal range and within 360 days    Potassium  Date Value Ref Range Status  05/06/2021 3.6 3.5 - 5.1 mmol/L Final  09/09/2014 4.3 mmol/L Final    Comment:    3.5-5.1 NOTE: New Reference Range  08/05/14          Passed - Na in normal range and within 360 days    Sodium  Date Value Ref Range Status  05/06/2021 137 135 - 145 mmol/L Final  05/26/2017 145 (H) 134 - 144 mmol/L Final  09/09/2014 137 mmol/L Final    Comment:    135-145 NOTE: New Reference Range  08/05/14          Passed - Valid encounter within last 6 months    Recent Outpatient Visits          3 months ago Type II diabetes mellitus with complication [facility])   [facility] Glean Hess, MD   6 months ago [facility] Glean Hess, MD   7 months ago Type II diabetes mellitus with complication [facility])   [facility] Glean Hess, MD   8 months ago Gross hematuria   [facility] Glean Hess, MD   11 months ago Type II diabetes mellitus with complication [facility])   [facility] Glean Hess, MD      Future Appointments            In 2 weeks Glean Hess, MD [facility], [facility]

## 2021-07-02 ENCOUNTER — Other Ambulatory Visit: Payer: Self-pay

## 2021-07-02 ENCOUNTER — Ambulatory Visit
Admission: RE | Admit: 2021-07-02 | Discharge: 2021-07-02 | Disposition: A | Discharge status: Home / Self Care | Payer: Medicare Other | Source: Ambulatory Visit | Attending: Oncology | Admitting: Oncology

## 2021-07-02 DIAGNOSIS — Z1231 Encounter for screening mammogram for malignant neoplasm of breast: Secondary | ICD-10-CM | POA: Diagnosis not present

## 2021-07-02 DIAGNOSIS — Z170 Estrogen receptor positive status [ER+]: Secondary | ICD-10-CM | POA: Diagnosis not present

## 2021-07-02 DIAGNOSIS — C50912 Malignant neoplasm of unspecified site of left female breast: Secondary | ICD-10-CM | POA: Diagnosis not present

## 2021-07-06 ENCOUNTER — Ambulatory Visit (INDEPENDENT_AMBULATORY_CARE_PROVIDER_SITE_OTHER): Payer: Medicare Other | Admitting: Internal Medicine

## 2021-07-06 ENCOUNTER — Other Ambulatory Visit
Admission: RE | Admit: 2021-07-06 | Discharge: 2021-07-06 | Disposition: A | Discharge status: Home / Self Care | Payer: Medicare Other | Attending: Internal Medicine | Admitting: Internal Medicine

## 2021-07-06 ENCOUNTER — Encounter: Payer: Self-pay | Admitting: Internal Medicine

## 2021-07-06 ENCOUNTER — Other Ambulatory Visit: Payer: Self-pay

## 2021-07-06 VITALS — BP 112/74 | HR 75 | Ht 68.0 in | Wt 199.0 lb

## 2021-07-06 DIAGNOSIS — I10 Essential (primary) hypertension: Secondary | ICD-10-CM | POA: Insufficient documentation

## 2021-07-06 DIAGNOSIS — E118 Type 2 diabetes mellitus with unspecified complications: Secondary | ICD-10-CM | POA: Diagnosis not present

## 2021-07-06 DIAGNOSIS — E785 Hyperlipidemia, unspecified: Secondary | ICD-10-CM | POA: Insufficient documentation

## 2021-07-06 DIAGNOSIS — I700 Atherosclerosis of aorta: Secondary | ICD-10-CM | POA: Diagnosis not present

## 2021-07-06 DIAGNOSIS — E1169 Type 2 diabetes mellitus with other specified complication: Secondary | ICD-10-CM | POA: Insufficient documentation

## 2021-07-06 DIAGNOSIS — C773 Secondary and unspecified malignant neoplasm of axilla and upper limb lymph nodes: Secondary | ICD-10-CM

## 2021-07-06 LAB — CBC WITH DIFFERENTIAL/PLATELET
Abs Immature Granulocytes: 0.03 10*3/uL (ref 0.00–0.07)
Basophils Absolute: 0.1 10*3/uL (ref 0.0–0.1)
Basophils Relative: 1 %
Eosinophils Absolute: 0.1 10*3/uL (ref 0.0–0.5)
Eosinophils Relative: 2 %
HCT: 38.4 % (ref 36.0–46.0)
Hemoglobin: 12.4 g/dL (ref 12.0–15.0)
Immature Granulocytes: 0 %
Lymphocytes Relative: 37 %
Lymphs Abs: 3.1 10*3/uL (ref 0.7–4.0)
MCH: 27.5 pg (ref 26.0–34.0)
MCHC: 32.3 g/dL (ref 30.0–36.0)
MCV: 85.1 fL (ref 80.0–100.0)
Monocytes Absolute: 0.3 10*3/uL (ref 0.1–1.0)
Monocytes Relative: 4 %
Neutro Abs: 4.6 10*3/uL (ref 1.7–7.7)
Neutrophils Relative %: 56 %
Platelets: 252 10*3/uL (ref 150–400)
RBC: 4.51 MIL/uL (ref 3.87–5.11)
RDW: 14.2 % (ref 11.5–15.5)
WBC: 8.3 10*3/uL (ref 4.0–10.5)
nRBC: 0 % (ref 0.0–0.2)

## 2021-07-06 LAB — COMPREHENSIVE METABOLIC PANEL
ALT: 19 U/L (ref 0–44)
AST: 20 U/L (ref 15–41)
Albumin: 4 g/dL (ref 3.5–5.0)
Alkaline Phosphatase: 71 U/L (ref 38–126)
Anion gap: 12 (ref 5–15)
BUN: 14 mg/dL (ref 8–23)
CO2: 25 mmol/L (ref 22–32)
Calcium: 9.1 mg/dL (ref 8.9–10.3)
Chloride: 99 mmol/L (ref 98–111)
Creatinine, Ser: 0.81 mg/dL (ref 0.44–1.00)
GFR, Estimated: >60 mL/min (ref >60)
Glucose, Bld: 114 mg/dL — ABNORMAL HIGH (ref 70–99)
Potassium: 3.7 mmol/L (ref 3.5–5.1)
Sodium: 136 mmol/L (ref 135–145)
Total Bilirubin: 0.5 mg/dL (ref 0.3–1.2)
Total Protein: 7.5 g/dL (ref 6.5–8.1)

## 2021-07-06 LAB — LIPID PANEL
Cholesterol: 163 mg/dL (ref 0–200)
HDL: 33 mg/dL — ABNORMAL LOW (ref >40)
LDL Cholesterol: 64 mg/dL (ref 0–99)
Total CHOL/HDL Ratio: 4.9 RATIO
Triglycerides: 331 mg/dL — ABNORMAL HIGH (ref <150)
VLDL: 66 mg/dL — ABNORMAL HIGH (ref 0–40)

## 2021-07-06 NOTE — Progress Notes (Signed)
Date:  07/06/2021   Name:  Donna Riley   DOB:  1960-12-05   MRN:  [id number]   Chief Complaint: Hypertension, Diabetes (Foot Exam. Eye Exam.), and Hyperlipidemia  Diabetes She presents for her follow-up diabetic visit. She has type 2 diabetes mellitus. Her disease course has been stable. Pertinent negatives for hypoglycemia include no headaches or tremors. Pertinent negatives for diabetes include no chest pain, no fatigue, no polydipsia and no polyuria. Current diabetic treatment includes oral agent (monotherapy) (metformin). She is following a generally healthy diet. She monitors blood glucose at home 1-2 x per week. There is no change in her home blood glucose trend. Her breakfast blood glucose is taken between 6-7 am. Her breakfast blood glucose range is generally 110-130 mg/dl. An ACE inhibitor/angiotensin II receptor blocker is being taken. She sees a podiatrist.Eye exam is current.  Hyperlipidemia This is a chronic problem. The problem is controlled. Pertinent negatives include no chest pain or shortness of breath. Current antihyperlipidemic treatment includes statins. The current treatment provides significant improvement of lipids.  Hypertension This is a chronic problem. The problem is controlled. Pertinent negatives include no chest pain, headaches, palpitations or shortness of breath. Past treatments include beta blockers, angiotensin blockers and calcium channel blockers.   Lab Results  Component Value Date   NA 137 05/06/2021   K 3.6 05/06/2021   CO2 23 05/06/2021   GLUCOSE 130 (H) 05/06/2021   BUN 17 05/06/2021   CREATININE 1.07 (H) 05/06/2021   CALCIUM 9.7 05/06/2021   GFRNONAA 59 (L) 05/06/2021   Lab Results  Component Value Date   CHOL 159 07/24/2020   HDL 37 (L) 07/24/2020   LDLCALC 62 07/24/2020   TRIG 301 (H) 07/24/2020   CHOLHDL 4.3 07/24/2020   Lab Results  Component Value Date   TSH 1.765 04/11/2018   Lab Results  Component Value Date   HGBA1C 6.0  (A) 03/05/2021   Lab Results  Component Value Date   WBC 8.3 05/06/2021   HGB 13.8 05/06/2021   HCT 42.7 05/06/2021   MCV 84.7 05/06/2021   PLT 254 05/06/2021   Lab Results  Component Value Date   ALT 19 05/06/2021   AST 20 05/06/2021   ALKPHOS 93 05/06/2021   BILITOT 0.5 05/06/2021   No results found for: 25OHVITD2, 25OHVITD3, VD25OH   Review of Systems  Constitutional:  Negative for appetite change, fatigue, fever and unexpected weight change.  HENT:  Negative for tinnitus and trouble swallowing.   Eyes:  Negative for visual disturbance.  Respiratory:  Negative for cough, chest tightness and shortness of breath.   Cardiovascular:  Negative for chest pain, palpitations and leg swelling.  Gastrointestinal:  Negative for abdominal pain.  Endocrine: Negative for polydipsia and polyuria.  Genitourinary:  Negative for dysuria and hematuria.  Musculoskeletal:  Negative for arthralgias.  Neurological:  Negative for tremors, numbness and headaches.  Psychiatric/Behavioral:  Negative for dysphoric mood.    Patient Active Problem List   Diagnosis Date Noted   Genetic testing 06/10/2021   Family history of breast cancer 05/19/2021   Family history of pancreatic cancer 05/19/2021   Family history of kidney cancer 05/19/2021   Family history of throat cancer 05/19/2021   Endometrial polyp    Smoking history 08/29/2020   Benign neuroendocrine tumor of sigmoid colon    History of rectal polyps    Nodule of colon    Polyp of ascending colon    Polyp of descending colon  Polyp of sigmoid colon    Sebaceous cyst 08/11/2020   Osteopenia of lumbar spine 07/19/2020   Breast pain, right 07/03/2020   Atherosclerosis of abdominal aorta ([place]) 02/06/2020   Tobacco use disorder, continuous 74/12/1446   Helicobacter pylori gastritis 06/03/2019   Abdominal bloating    Nausea    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    Lumbosacral spondylosis without myelopathy 11/22/2018   Vertigo 10/09/2018   Acute midline low back pain without sciatica 10/09/2018   Hyperlipidemia associated with type 2 diabetes mellitus ([place]) 04/11/2018   Gastroparesis 04/11/2018   Long term (current) use of aromatase inhibitors 12/25/2017   Arthritis    Elevated LFTs    Essential hypertension    Positive PPD, treated    Umbilical hernia without obstruction and without gangrene 07/13/2016   Rectal polyp    Irritable bowel syndrome with both constipation and diarrhea 07/20/2015   Type II diabetes mellitus with complication ([place]) 18/56/3149   Pre-ulcerative calluses 06/22/2015   Neuropathy 06/22/2015   Hot flash, menopausal 04/09/2015   Atherosclerotic peripheral vascular disease with intermittent claudication ([place]) 03/25/2015   Osteoporosis of lumbar spine 03/12/2015   Malignant neoplasm of left breast in female, estrogen receptor positive ([place]) 05/30/2014   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    No Known Allergies  Past Surgical History:  Procedure Laterality Date   BREAST BIOPSY Left 07/31/2014   [facility] and DCIS    BREAST BIOPSY Left 03/08/2016   INTRADUCTAL PAPILLOMA    BREAST LUMPECTOMY Left 07/2014   [facility], DCIS, clear margins, positive LN   BREAST LUMPECTOMY Left 04/05/2016   excision of intraductal papilloma, no malignancy or atypia   BREAST LUMPECTOMY WITH NEEDLE LOCALIZATION Left 04/05/2016   Procedure: BREAST LUMPECTOMY WITH NEEDLE LOCALIZATION;  Surgeon: Hubbard Robinson, MD;  Location: [facility];  Service: General;  Laterality: Left;   BREAST LUMPECTOMY WITH NEEDLE LOCALIZATION AND AXILLARY SENTINEL LYMPH NODE BX Left 08/27/14   CHOLECYSTECTOMY  09/07/2013   COLONOSCOPY WITH PROPOFOL N/A 08/28/2015   Procedure: COLONOSCOPY WITH PROPOFOL;  Surgeon: Lucilla Lame, MD;  Location: [place];  Service: Endoscopy;  Laterality: N/A;  Diabetic oral   COLONOSCOPY WITH PROPOFOL N/A 08/14/2020   Procedure:  COLONOSCOPY WITH PROPOFOL;  Surgeon: Virgel Manifold, MD;  Location: [facility];  Service: Endoscopy;  Laterality: N/A;   ENDOSCOPIC MUCOSAL RESECTION N/A 09/10/2020   Procedure: ENDOSCOPIC MUCOSAL RESECTION;  Surgeon: Rush Landmark Telford Nab., MD;  Location: [place];  Service: Gastroenterology;  Laterality: N/A;   ESOPHAGOGASTRODUODENOSCOPY  2014   gastritis   ESOPHAGOGASTRODUODENOSCOPY (EGD) WITH PROPOFOL N/A 04/16/2019   Procedure: ESOPHAGOGASTRODUODENOSCOPY (EGD) WITH PROPOFOL;  Surgeon: Virgel Manifold, MD;  Location: [facility];  Service: Endoscopy;  Laterality: N/A;   ESOPHAGOGASTRODUODENOSCOPY (EGD) WITH PROPOFOL N/A 10/03/2019   Procedure: ESOPHAGOGASTRODUODENOSCOPY (EGD) WITH PROPOFOL;  Surgeon: Virgel Manifold, MD;  Location: [facility];  Service: Endoscopy;  Laterality: N/A;   EUS N/A 09/10/2020   Procedure: LOWER ENDOSCOPIC ULTRASOUND (EUS);  Surgeon: Irving Copas., MD;  Location: [place];  Service: Gastroenterology;  Laterality: N/A;   FLEXIBLE SIGMOIDOSCOPY N/A 08/27/2020   Procedure: FLEXIBLE SIGMOIDOSCOPY;  Surgeon: Virgel Manifold, MD;  Location: [facility];  Service: Endoscopy;  Laterality: N/A;   FLEXIBLE SIGMOIDOSCOPY N/A 09/10/2020   Procedure: FLEXIBLE SIGMOIDOSCOPY;  Surgeon: Rush Landmark Telford Nab., MD;  Location: [place];  Service: Gastroenterology;  Laterality: N/A;   FOREIGN BODY REMOVAL  09/10/2020   Procedure: FOREIGN  BODY REMOVAL ;  Surgeon: Rush Landmark Telford Nab., MD;  Location: [place];  Service: Gastroenterology;;   HEMOSTASIS CLIP PLACEMENT  09/10/2020   Procedure: HEMOSTASIS CLIP PLACEMENT;  Surgeon: Irving Copas., MD;  Location: [place];  Service: Gastroenterology;;   HYSTEROSCOPY WITH D & C N/A 03/25/2021   Procedure: DILATATION AND CURETTAGE [place];  Surgeon: Malachy Mood, MD;  Location: [facility];  Service: Gynecology;  Laterality: N/A;   POLYPECTOMY  08/28/2015    Procedure: POLYPECTOMY INTESTINAL;  Surgeon: Lucilla Lame, MD;  Location: [place];  Service: Endoscopy;;  Rectal polyp   SUBMUCOSAL LIFTING INJECTION  09/10/2020   Procedure: SUBMUCOSAL LIFTING INJECTION;  Surgeon: Irving Copas., MD;  Location: [place];  Service: Gastroenterology;;   TUBAL LIGATION Bilateral    UMBILICAL HERNIA REPAIR N/A 08/09/2016   Procedure: HERNIA REPAIR UMBILICAL ADULT;  Surgeon: Olean Ree, MD;  Location: [facility];  Service: General;  Laterality: N/A;   VENTRAL HERNIA REPAIR N/A 04/10/2015   Procedure: HERNIA REPAIR VENTRAL ADULT;  Surgeon: Leonie Green, MD;  Location: [facility];  Service: General;  Laterality: N/A;    Social History   Tobacco Use   Smoking status: Every Day    Packs/day: 0.50    Years: 44.00    Pack years: 22.00    Types: Cigarettes   Smokeless tobacco: Never  Vaping Use   Vaping Use: Never used  Substance Use Topics   Alcohol use: Yes    Alcohol/week: 2.0 standard drinks    Types: 2 Cans of beer per week    Comment: weekends - beer    Drug use: No     Medication list has been reviewed and updated.  Current Meds  Medication Sig   acetaminophen (TYLENOL) 325 MG tablet Take 650 mg by mouth daily as needed for moderate pain.   amLODipine (NORVASC) 10 MG tablet TAKE TWO TABLETS BY MOUTH ONCE DAILY   aspirin EC 81 MG tablet Take 81 mg by mouth daily.   atorvastatin (LIPITOR) 10 MG tablet TAKE ONE TABLET BY MOUTH AT BEDTIME   Blood Glucose Calibration (TRUE METRIX LEVEL 1) Low SOLN 1 each by In Vitro route daily as needed.   Blood Glucose Monitoring Suppl (TRUE METRIX METER) DEVI 1 each by Does not apply route daily.   calcium-vitamin D (OSCAL WITH D) 500-200 MG-UNIT TABS tablet Take 1 tablet by mouth daily.   glucose blood (PRECISION QID TEST) test strip    ibuprofen (ADVIL) 600 MG tablet Take 1 tablet (600 mg total) by mouth every 6 (six) hours as needed for mild pain or cramping.   irbesartan (AVAPRO)  300 MG tablet TAKE ONE TABLET BY MOUTH ONCE DAILY   metFORMIN (GLUCOPHAGE-XR) 500 MG 24 hr tablet TAKE ONE TABLET BY MOUTH EVERY MORNING WITH BREAKFAST   Multiple Vitamins-Minerals (MULTIVITAMIN WITH MINERALS) tablet Take 1 tablet by mouth daily.   triamterene-hydrochlorothiazide (MAXZIDE-25) 37.5-25 MG tablet Take 1 tablet by mouth daily.   [DISCONTINUED] Alcohol Swabs (B-D SINGLE USE SWABS REGULAR) PADS 1 each by Does not apply route 2 (two) times daily.   [DISCONTINUED] hydrochlorothiazide (HYDRODIURIL) 25 MG tablet TAKE ONE TABLET BY MOUTH ONCE DAILY    PHQ 2/9 Scores 07/06/2021 07/06/2021 03/05/2021 11/03/2020  PHQ - 2 Score 0 0 0 0  PHQ- 9 Score 1 1 4 4     GAD 7 : Generalized Anxiety Score 07/06/2021 07/06/2021 03/05/2021 11/03/2020  Nervous, Anxious, on Edge 0 0 1 1  Control/stop worrying 0 0 0 0  Worry too much - different things 1 1 0 0  Trouble relaxing 0 0 0 1  Restless 0 0 0 0  Easily annoyed or irritable 0 0 1 0  Afraid - awful might happen 0 0 0 0  Total GAD 7 Score 1 1 2 2   Anxiety Difficulty Not difficult at all Not difficult at all - Not difficult at all    BP Readings from Last 3 Encounters:  07/06/21 112/74  05/06/21 (!) 152/71  05/05/21 (!) 148/76    Physical Exam Vitals and nursing note reviewed.  Constitutional:      General: She is not in acute distress.    Appearance: She is well-developed.  HENT:     Head: Normocephalic and atraumatic.  Cardiovascular:     Rate and Rhythm: Normal rate and regular rhythm.  Pulmonary:     Effort: Pulmonary effort is normal. No respiratory distress.     Breath sounds: No wheezing or rhonchi.  Musculoskeletal:     Cervical back: Normal range of motion and neck supple.     Right lower leg: No edema.     Left lower leg: No edema.  Skin:    General: Skin is warm and dry.     Findings: No rash.  Neurological:     Mental Status: She is alert and oriented to person, place, and time.  Psychiatric:        Mood and Affect: Mood  normal.        Behavior: Behavior normal.    Wt Readings from Last 3 Encounters:  07/06/21 199 lb (90.3 kg)  05/06/21 199 lb 3.2 oz (90.4 kg)  05/05/21 200 lb (90.7 kg)    BP 112/74    Pulse 75    Ht 5\' 8"  (1.727 m)    Wt 199 lb (90.3 kg)    SpO2 97%    BMI 30.26 kg/m   Assessment and Plan: 1. Essential hypertension Clinically stable exam with well controlled BP. Tolerating medications without side effects at this time. Pt to continue current regimen and low sodium diet; benefits of regular exercise as able discussed. - CBC with Differential/Platelet  2. Type II diabetes mellitus with complication (HCC) Clinically stable by exam and report without s/s of hypoglycemia. DM complicated by hypertension and dyslipidemia. Tolerating medications well without side effects or other concerns. - Comprehensive metabolic panel - Hemoglobin A1c - Microalbumin / creatinine urine ratio  3. Hyperlipidemia associated with type 2 diabetes mellitus ([place]) Tolerating statin medication without side effects at this time LDL is at goal of < 70 on current dose Continue same therapy without change at this time. - Lipid panel  4. 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  5. Aortic atherosclerosis ([place]) On appropriate statin and aspirin therapy Recent Cardiology visit was stable - one med change made from HCTZ to Maxzide   Partially dictated using [employer]. Any errors are unintentional.  Halina Maidens, MD [facility]  07/06/2021

## 2021-07-07 LAB — HEMOGLOBIN A1C
Hgb A1c MFr Bld: 6.1 % — ABNORMAL HIGH (ref 4.8–5.6)
Mean Plasma Glucose: 128.37 mg/dL

## 2021-07-07 LAB — MICROALBUMIN / CREATININE URINE RATIO
Creatinine, Urine: 131.9 mg/dL
Microalb/Creat Ratio: 6 mg/g creat (ref 0–29)
Microalbumin, Urine: 8 ug/mL

## 2021-07-09 ENCOUNTER — Encounter: Payer: Self-pay | Admitting: Internal Medicine

## 2021-07-14 ENCOUNTER — Ambulatory Visit: Payer: Medicare Other

## 2021-07-15 DIAGNOSIS — D2371 Other benign neoplasm of skin of right lower limb, including hip: Secondary | ICD-10-CM | POA: Diagnosis not present

## 2021-07-15 DIAGNOSIS — E119 Type 2 diabetes mellitus without complications: Secondary | ICD-10-CM | POA: Diagnosis not present

## 2021-07-27 DIAGNOSIS — M25511 Pain in right shoulder: Secondary | ICD-10-CM | POA: Diagnosis not present

## 2021-07-27 DIAGNOSIS — M7541 Impingement syndrome of right shoulder: Secondary | ICD-10-CM | POA: Diagnosis not present

## 2021-08-19 DIAGNOSIS — E119 Type 2 diabetes mellitus without complications: Secondary | ICD-10-CM | POA: Diagnosis not present

## 2021-08-19 DIAGNOSIS — M79671 Pain in right foot: Secondary | ICD-10-CM | POA: Diagnosis not present

## 2021-08-19 DIAGNOSIS — D2371 Other benign neoplasm of skin of right lower limb, including hip: Secondary | ICD-10-CM | POA: Diagnosis not present

## 2021-09-06 ENCOUNTER — Inpatient Hospital Stay: Payer: Medicare Other | Attending: Oncology

## 2021-09-06 ENCOUNTER — Inpatient Hospital Stay (HOSPITAL_BASED_OUTPATIENT_CLINIC_OR_DEPARTMENT_OTHER): Payer: Medicare Other | Admitting: Oncology

## 2021-09-06 ENCOUNTER — Encounter: Payer: Self-pay | Admitting: Oncology

## 2021-09-06 VITALS — BP 164/72 | HR 92 | Temp 97.6°F | Wt 205.0 lb

## 2021-09-06 DIAGNOSIS — C50912 Malignant neoplasm of unspecified site of left female breast: Secondary | ICD-10-CM

## 2021-09-06 DIAGNOSIS — Z800 Family history of malignant neoplasm of digestive organs: Secondary | ICD-10-CM | POA: Insufficient documentation

## 2021-09-06 DIAGNOSIS — Z87891 Personal history of nicotine dependence: Secondary | ICD-10-CM

## 2021-09-06 DIAGNOSIS — Z8051 Family history of malignant neoplasm of kidney: Secondary | ICD-10-CM | POA: Diagnosis not present

## 2021-09-06 DIAGNOSIS — Z170 Estrogen receptor positive status [ER+]: Secondary | ICD-10-CM | POA: Diagnosis not present

## 2021-09-06 DIAGNOSIS — C7A8 Other malignant neuroendocrine tumors: Secondary | ICD-10-CM

## 2021-09-06 DIAGNOSIS — F17210 Nicotine dependence, cigarettes, uncomplicated: Secondary | ICD-10-CM | POA: Diagnosis not present

## 2021-09-06 DIAGNOSIS — Z7984 Long term (current) use of oral hypoglycemic drugs: Secondary | ICD-10-CM | POA: Insufficient documentation

## 2021-09-06 DIAGNOSIS — N644 Mastodynia: Secondary | ICD-10-CM

## 2021-09-06 DIAGNOSIS — Z853 Personal history of malignant neoplasm of breast: Secondary | ICD-10-CM | POA: Diagnosis not present

## 2021-09-06 DIAGNOSIS — C7A026 Malignant carcinoid tumor of the rectum: Secondary | ICD-10-CM | POA: Insufficient documentation

## 2021-09-06 DIAGNOSIS — Z801 Family history of malignant neoplasm of trachea, bronchus and lung: Secondary | ICD-10-CM | POA: Diagnosis not present

## 2021-09-06 DIAGNOSIS — M8580 Other specified disorders of bone density and structure, unspecified site: Secondary | ICD-10-CM

## 2021-09-06 DIAGNOSIS — Z79899 Other long term (current) drug therapy: Secondary | ICD-10-CM | POA: Diagnosis not present

## 2021-09-06 DIAGNOSIS — Z803 Family history of malignant neoplasm of breast: Secondary | ICD-10-CM | POA: Insufficient documentation

## 2021-09-06 DIAGNOSIS — Z7982 Long term (current) use of aspirin: Secondary | ICD-10-CM | POA: Diagnosis not present

## 2021-09-06 LAB — CBC WITH DIFFERENTIAL/PLATELET
Abs Immature Granulocytes: 0.04 10*3/uL (ref 0.00–0.07)
Basophils Absolute: 0.1 10*3/uL (ref 0.0–0.1)
Basophils Relative: 1 %
Eosinophils Absolute: 0.1 10*3/uL (ref 0.0–0.5)
Eosinophils Relative: 1 %
HCT: 40.2 % (ref 36.0–46.0)
Hemoglobin: 13 g/dL (ref 12.0–15.0)
Immature Granulocytes: 1 %
Lymphocytes Relative: 34 %
Lymphs Abs: 2.9 10*3/uL (ref 0.7–4.0)
MCH: 27.5 pg (ref 26.0–34.0)
MCHC: 32.3 g/dL (ref 30.0–36.0)
MCV: 85.2 fL (ref 80.0–100.0)
Monocytes Absolute: 0.6 10*3/uL (ref 0.1–1.0)
Monocytes Relative: 7 %
Neutro Abs: 4.9 10*3/uL (ref 1.7–7.7)
Neutrophils Relative %: 56 %
Platelets: 250 10*3/uL (ref 150–400)
RBC: 4.72 MIL/uL (ref 3.87–5.11)
RDW: 14.6 % (ref 11.5–15.5)
WBC: 8.7 10*3/uL (ref 4.0–10.5)
nRBC: 0 % (ref 0.0–0.2)

## 2021-09-06 LAB — COMPREHENSIVE METABOLIC PANEL
ALT: 19 U/L (ref 0–44)
AST: 23 U/L (ref 15–41)
Albumin: 3.9 g/dL (ref 3.5–5.0)
Alkaline Phosphatase: 86 U/L (ref 38–126)
Anion gap: 9 (ref 5–15)
BUN: 13 mg/dL (ref 8–23)
CO2: 26 mmol/L (ref 22–32)
Calcium: 8.8 mg/dL — ABNORMAL LOW (ref 8.9–10.3)
Chloride: 99 mmol/L (ref 98–111)
Creatinine, Ser: 0.9 mg/dL (ref 0.44–1.00)
GFR, Estimated: >60 mL/min (ref >60)
Glucose, Bld: 133 mg/dL — ABNORMAL HIGH (ref 70–99)
Potassium: 3.4 mmol/L — ABNORMAL LOW (ref 3.5–5.1)
Sodium: 134 mmol/L — ABNORMAL LOW (ref 135–145)
Total Bilirubin: 0.6 mg/dL (ref 0.3–1.2)
Total Protein: 7.4 g/dL (ref 6.5–8.1)

## 2021-09-06 NOTE — Progress Notes (Signed)
?Hematology Oncology Progress Note ? ? ?Clinic Day:  09/06/2021 ? ?Referring physician: Glean Hess, MD ? ?Chief Complaint: Donna Riley is a 61 y.o. female presents for follow-up of stage IIA left breast cancer and rectal neuroendocrine carcinoma. ? ?PERTINENT ONCOLOGY HISTORY ?Patient previously followed up by Dr.Corcoran, patient switched care to me on 12/31/20 ?Extensive medical record review was performed by me ? ?#stage IIA left breast cancer  ?08/27/2014 partial mastectomy and sentinel lymph node biopsy on 08/27/2014.  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  There was lymphovascular invasion.  One of 2 sentinel lymph nodes were positive  (focus of 2.8 mm).  Tumor was > 90% ER positive, > 90% PR positive, and Her2/neu negative.  Pathologic stage was [id number]. ?  ?Bone scan on 09/16/2014 revealed abnormal focal uptake at the level of right L3 pedicle and L5 vertebral body.  Lumbar spine MRI on 09/27/2014 revealed no evidence of metastatic disease with lower lumbar facet arthritis.  Abdominal and pelvic CT scan on 09/24/2014 revealed hepatomegaly and no evidence of metastatic disease.   ?  ?Adjuvant chemotherapy  ?4 cycles of Taxotere and Cytoxan (09/29/2014 - 12/09/2014) with Neulasta support. She received 50.4 Gy to the left breast from 01/12/2015 until 02/23/2015.   ? ?03/12/2015, started on letrozole but switched to Arimidex on 03/30/2015 secondary to diffuse joint aches.   ?Arimidex completed around 03/13/2020. ? ?BCI testing on 08/20/2019 revealed an estimated risk of late recurrence between years 5-10 of 10.1% (95% CI: 5.6-14.3%).  She is not likely to benefit from extended endocrine therapy. ? ? ?She had chronic left nipple discharge.  Left breast lumpectomy at the 11 o'clock position on 04/05/2016 revealed an intraductal papilloma with sclerosis and calcifications. There was fat necrosis with fibrosis and calcifications.  Pathology was negative for atypia and malignancy ? ?Health maintenance ?Colonoscopy on 08/28/2015 revealed one 4 mm polyp in the rectum (tubular adenoma). EGD on 10/03/2019 revealed erythematous mucosa in the antrum. Pathology revealed antral and oxyntic mucosa with moderate chronic inactive gastritis. H. Pylori was negative. There was no intestinal metaplasia, dysplasia, and malignancy.  ? ?#Osteoporosis ?Bone density study on 09/15/2014 revealed osteopenia with a T-score of -2.1 at L1-L4.  Bone density on 01/17/2018 revealed osteoporosis with a T-score of -2.6 in the AP spine L1-L4.  Bone density on 03/03/2020 revealed osteopenia with a T score of -2.2 at the lumbar spine. She is on calcium and vitamin D.  ? ?#Lung cancer screening  ?She has a smoking history.  Low dose chest CT on 02/05/2020 revealed Lung-RADS 2S, benign appearance or behavior.  There was aortic atherosclerosis, in addition to left anterior descending coronary artery disease. There was mild diffuse bronchial wall thickening with mild centrilobular and paraseptal emphysema suggestive of underlying COPD. ? ?08/09/2020 colonoscopy by Dr. Bonna Gains showed 5 polyp in ascending colon, descending colon and sigmoid colon resected and retrieved-pathology showed tubular adenoma, multiple fragments.  Negative for high-grade dysplasia and malignancy. ?Nodule in the sigmoid colon, biopsied.  Clip was placed.-Biopsy showed well-differentiated neuroendocrine tumor.  Grade 1. ? ?08/27/2020, flexible sigmoidoscopy showed mucosal nodule in the sigmoid colon, tattooed.  Nonbleeding internal hemorrhoids.   ? ?09/10/2020  Flex Sigmoidoscopy/Lower EUS by Dr. Rush Landmark, and EMR  ?Pathology showed proximal rectum EMR ?Well differentiated neuroendocrine tumor-carcinoid.  Neuroendocrine tumor less 10.1 cm from cauterized margin. ?1.2 x 1 x 0.8 cm. ? ?Dr. Rush Landmark felt to be a complete resection and recommend  1 year follow-up lower EUS. ?09/30/2020 CT abdomen pelvis showed  sutures near the rectosigmoid junction consistent with history.  Hepatic steatosis.  ?09/30/2020 Chromogranin A level at 49.6. ? ?INTERVAL HISTORY ?Donna Riley is a 61 y.o. female who has above history reviewed by me today presents for follow up for history of breast cancer as well as rectal neuroendocrine carcinoma. ?She reports feeling well. No new complaints. ?Denies weight loss, fever, chills, fatigue, night sweats.  ?No blood in stool.  ? ? ? ?Past Medical History:  ?Diagnosis Date  ? Arthritis   ? shoulders and neck  ? Breast cancer, left ([place]) 08/04/2014  ? a.) Bx (+) for invasive mammary carcinoma with DCIS; ER/PR (+), HER2/neu (-). (+) lymphovascular invasion with 1 of 2 lymph nodes being (+). Treated with lumpectomy + adjuvant chemoradiation.  ? Breast wound, left, sequela 06/23/2016  ? Cancer of gastrointestinal tract ([place]) 08/2020  ? COPD (chronic obstructive pulmonary disease) ([place])   ? no inhalers  ? Coronary artery disease   ? a.) TTE and MPI normal in 10/2020; EF > 55%.  ? Diastolic dysfunction 54/00/8676  ? a.)  TTE 10/29/2020: EF >55%; G1DD  ? DOE (dyspnea on exertion)   ? Elevated LFTs   ? Endometrial polyp 02/03/2021  ? a.) Pelvic US --> 18 x 5 x 10 mm lenticular nodule in endrometrial canal; favored endometrial polyp vs. neoplasm. b.) Bx 02/23/2021 --> no dysplasia or malignancy; benign.  ? Family history of breast cancer   ? Family history of kidney cancer   ? Family history of pancreatic cancer   ? Family history of throat cancer   ? Fatigue 08/01/2016  ? GERD (gastroesophageal reflux disease)   ? occ-no meds  ? Gout   ? Hydradenitis 01/23/2015  ? Hyperlipidemia   ? Hypertension   ? Mastitis 10/15/2016  ? Neuroendocrine tumor 08/14/2020  ? a.) Rectal mass Bx (+) for well differentiated NET (grade I). IHC for cytokeratin AE1/AE3 and CD56 supports.  ? Positive PPD, treated   ? T2DM (type 2 diabetes mellitus) ([place]) 06/2014  ? Valvular regurgitation 10/29/2020  ? a.) TTE 10/29/2020: EF >55%;  trivial AR/TR/PR, mild MR  ? ? ?Past Surgical History:  ?Procedure Laterality Date  ? BREAST BIOPSY Left 07/31/2014  ? [facility] and DCIS   ? BREAST BIOPSY Left 03/08/2016  ? INTRADUCTAL PAPILLOMA   ? BREAST LUMPECTOMY Left 07/2014  ? [facility], DCIS, clear margins, positive LN  ? BREAST LUMPECTOMY Left 04/05/2016  ? excision of intraductal papilloma, no malignancy or atypia  ? BREAST LUMPECTOMY WITH NEEDLE LOCALIZATION Left 04/05/2016  ? Procedure: BREAST LUMPECTOMY WITH NEEDLE LOCALIZATION;  Surgeon: Hubbard Robinson, MD;  Location: [facility];  Service: General;  Laterality: Left;  ? BREAST LUMPECTOMY WITH NEEDLE LOCALIZATION AND AXILLARY SENTINEL LYMPH NODE BX Left 08/27/14  ? CHOLECYSTECTOMY  09/07/2013  ? COLONOSCOPY WITH PROPOFOL N/A 08/28/2015  ? Procedure: COLONOSCOPY WITH PROPOFOL;  Surgeon: Lucilla Lame, MD;  Location: [place];  Service: Endoscopy;  Laterality: N/A;  Diabetic oral  ? COLONOSCOPY WITH PROPOFOL N/A 08/14/2020  ? Procedure: COLONOSCOPY WITH PROPOFOL;  Surgeon: Virgel Manifold, MD;  Location: [facility];  Service: Endoscopy;  Laterality: N/A;  ? ENDOSCOPIC MUCOSAL RESECTION N/A 09/10/2020  ? Procedure: ENDOSCOPIC MUCOSAL RESECTION;  Surgeon: Rush Landmark Telford Nab., MD;  Location: [place];  Service: Gastroenterology;  Laterality: N/A;  ? ESOPHAGOGASTRODUODENOSCOPY  2014  ? gastritis  ? ESOPHAGOGASTRODUODENOSCOPY (EGD) WITH PROPOFOL N/A 04/16/2019  ? Procedure: ESOPHAGOGASTRODUODENOSCOPY (EGD)  WITH PROPOFOL;  Surgeon: Virgel Manifold, MD;  Location: [facility];  Service: Endoscopy;  Laterality: N/A;  ? ESOPHAGOGASTRODUODENOSCOPY (EGD) WITH PROPOFOL N/A 10/03/2019  ? Procedure: ESOPHAGOGASTRODUODENOSCOPY (EGD) WITH PROPOFOL;  Surgeon: Virgel Manifold, MD;  Location: [facility];  Service: Endoscopy;  Laterality: N/A;  ? EUS N/A 09/10/2020  ? Procedure: LOWER ENDOSCOPIC ULTRASOUND (EUS);  Surgeon: Irving Copas., MD;  Location: [place];  Service:  Gastroenterology;  Laterality: N/A;  ? FLEXIBLE SIGMOIDOSCOPY N/A 08/27/2020  ? Procedure: FLEXIBLE SIGMOIDOSCOPY;  Surgeon: Virgel Manifold, MD;  Location: [facility];  Service: Endoscopy;  Laterality: N/A;  ?

## 2021-09-07 ENCOUNTER — Other Ambulatory Visit: Payer: Self-pay

## 2021-09-07 ENCOUNTER — Telehealth: Payer: Self-pay

## 2021-09-07 DIAGNOSIS — C20 Malignant neoplasm of rectum: Secondary | ICD-10-CM

## 2021-09-07 DIAGNOSIS — K31A0 Gastric intestinal metaplasia, unspecified: Secondary | ICD-10-CM

## 2021-09-07 NOTE — Telephone Encounter (Signed)
Lower EUS scheduled, pt instructed and medications reviewed.  Patient instructions mailed to home.  Patient to call with any questions or concerns.  ?

## 2021-09-07 NOTE — Telephone Encounter (Signed)
-----   Message from Irving Copas., MD sent at 09/07/2021  8:31 AM EDT ----- ?Francene Boyers, ?No worries patient should be coming up on a recall list for this month since it was a year EUS follow-up. ? ?Carrol Hougland, ?Please work on scheduling this patient's lower EUS when able.  Thanks. ?GM ?----- Message ----- ?From: Earlie Server, MD ?Sent: 09/06/2021  10:01 PM EDT ?To: Clent Jacks, RN, # ? ?Dr.Mansouraty,  ?This patient has Stage I neuroendocrine carcinoma of the rectum, s/p resection, close margin. Would you please see her for repeat EUS for surveillance?  ?Thanks ? ?[place] ? ? ? ?

## 2021-09-07 NOTE — Telephone Encounter (Signed)
Ro if you have this pt in your recalls disregard I will take care of it.  ?

## 2021-09-07 NOTE — Telephone Encounter (Signed)
Lower EUS has been scheduled for 11/11/21 at [facility] with GM at 10 am  ?

## 2021-09-08 LAB — CHROMOGRANIN A: Chromogranin A (ng/mL): 99.7 ng/mL (ref 0.0–101.8)

## 2021-09-21 ENCOUNTER — Other Ambulatory Visit: Payer: Self-pay | Admitting: Internal Medicine

## 2021-09-22 NOTE — Telephone Encounter (Signed)
Discontinued on 07/06/21 per OV note, medication not on current list. Will refuse this request. ? ?Requested Prescriptions  ?Pending Prescriptions Disp Refills  ?? hydrochlorothiazide (HYDRODIURIL) 25 MG tablet [Pharmacy Med Name: HYDROCHLOROTHIAZIDE '25MG'$ TABLETS] 90 tablet   ?  Sig: TAKE ONE TABLET BY MOUTH ONCE DAILY  ?  ? Cardiovascular: Diuretics - Thiazide Failed - 09/21/2021  9:45 AM  ?  ?  Failed - K in normal range and within 180 days  ?  Potassium  ?Date Value Ref Range Status  ?09/06/2021 3.4 (L) 3.5 - 5.1 mmol/L Final  ?09/09/2014 4.3 mmol/L Final  ?  Comment:  ?  3.5-5.1 ?NOTE: New Reference Range ? 08/05/14 ?  ?   ?  ?  Failed - Na in normal range and within 180 days  ?  Sodium  ?Date Value Ref Range Status  ?09/06/2021 134 (L) 135 - 145 mmol/L Final  ?05/26/2017 145 (H) 134 - 144 mmol/L Final  ?09/09/2014 137 mmol/L Final  ?  Comment:  ?  135-145 ?NOTE: New Reference Range ? 08/05/14 ?  ?   ?  ?  Failed - Last BP in normal range  ?  BP Readings from Last 1 Encounters:  ?09/06/21 (!) 164/72  ?   ?  ?  Passed - Cr in normal range and within 180 days  ?  Creatinine  ?Date Value Ref Range Status  ?09/09/2014 0.67 mg/dL Final  ?  Comment:  ?  0.44-1.00 ?NOTE: New Reference Range ? 08/05/14 ?  ? ?Creatinine, Ser  ?Date Value Ref Range Status  ?09/06/2021 0.90 0.44 - 1.00 mg/dL Final  ?   ?  ?  Passed - Valid encounter within last 6 months  ?  Recent Outpatient Visits   ?      ? 2 months ago Essential hypertension  ? [facility] Glean Hess, MD  ? 6 months ago Type II diabetes mellitus with complication [facility])  ? [facility] Glean Hess, MD  ? 9 months ago Rash  ? [facility] Glean Hess, MD  ? 10 months ago Type II diabetes mellitus with complication [facility])  ? [facility] Glean Hess, MD  ? 11 months ago Gross hematuria  ? [facility] Glean Hess, MD  ?  ?  ?Future Appointments   ?        ? In 1 month [facility] Melia Jesse Sans, MD [facility], [place]  ?  ? ?  ?  ?  ? ? ?

## 2021-11-01 ENCOUNTER — Encounter (HOSPITAL_COMMUNITY): Payer: Self-pay | Admitting: Gastroenterology

## 2021-11-01 ENCOUNTER — Encounter: Payer: Self-pay | Admitting: Pharmacist

## 2021-11-01 NOTE — Progress Notes (Signed)
[facility])                                            [place] Team                                        Statin Quality Measure Assessment    11/01/2021  Donna Riley [birth year] [id number]  Per review of chart and payor information, patient has a diagnosis of cardiovascular disease but is not currently filling a statin prescription.  This places patient into the [facility] (Statin Use In Patients with Cardiovascular Disease) measure for CMS.    Atorvastatin 10 mg 1 tab PO daily last filled  04/29/2021 #90.      Component Value Date/Time   CHOL 163 07/06/2021 0912   TRIG 331 (H) 07/06/2021 0912   HDL 33 (L) 07/06/2021 0912   CHOLHDL 4.9 07/06/2021 0912   VLDL 66 (H) 07/06/2021 0912   LDLCALC 64 07/06/2021 0912     Please consider ONE of the following recommendations:  Initiate high intensity statin Atorvastatin '40mg'$  once daily, #90, 3 refills   Rosuvastatin '20mg'$  once daily, #90, 3 refills    Initiate moderate intensity  statin with reduced frequency if prior  statin intolerance 1x weekly, #13, 3 refills   2x weekly, #26, 3 refills   3x weekly, #39, 3 refills    Code for past statin intolerance  (required annually)  Provider Requirements: Must asociate code during an office visit or telehealth encounter   Drug Induced Myopathy G72.0   Myalgia M79.1   Myositis, unspecified M60.9   Myopathy, unspecified G72.9   Rhabdomyolysis M62.82    Loretha Brasil, PharmD [place] Pharmacist Office: [phone]

## 2021-11-03 ENCOUNTER — Encounter: Payer: Self-pay | Admitting: Internal Medicine

## 2021-11-03 ENCOUNTER — Ambulatory Visit (INDEPENDENT_AMBULATORY_CARE_PROVIDER_SITE_OTHER): Payer: Medicare Other | Admitting: Internal Medicine

## 2021-11-03 VITALS — BP 124/70 | HR 81 | Ht 68.0 in | Wt 210.0 lb

## 2021-11-03 VITALS — BP 124/70 | HR 81 | Ht 68.0 in | Wt 210.2 lb

## 2021-11-03 DIAGNOSIS — E785 Hyperlipidemia, unspecified: Secondary | ICD-10-CM

## 2021-11-03 DIAGNOSIS — F17209 Nicotine dependence, unspecified, with unspecified nicotine-induced disorders: Secondary | ICD-10-CM

## 2021-11-03 DIAGNOSIS — E118 Type 2 diabetes mellitus with unspecified complications: Secondary | ICD-10-CM | POA: Diagnosis not present

## 2021-11-03 DIAGNOSIS — Z23 Encounter for immunization: Secondary | ICD-10-CM

## 2021-11-03 DIAGNOSIS — I10 Essential (primary) hypertension: Secondary | ICD-10-CM

## 2021-11-03 DIAGNOSIS — E1169 Type 2 diabetes mellitus with other specified complication: Secondary | ICD-10-CM

## 2021-11-03 DIAGNOSIS — Z0000 Encounter for general adult medical examination without abnormal findings: Secondary | ICD-10-CM

## 2021-11-03 LAB — POCT GLYCOSYLATED HEMOGLOBIN (HGB A1C): Hemoglobin A1C: 6.5 % — AB (ref 4.0–5.6)

## 2021-11-03 MED ORDER — TRIAMTERENE-HCTZ 37.5-25 MG PO TABS: 1.0000 | ORAL_TABLET | Freq: Every day | ORAL | 1 refills | Status: DC

## 2021-11-03 MED ORDER — IRBESARTAN 300 MG PO TABS: 300.0000 mg | ORAL_TABLET | Freq: Every day | ORAL | 1 refills | Status: DC

## 2021-11-03 MED ORDER — AMLODIPINE BESYLATE 10 MG PO TABS: 20.0000 mg | ORAL_TABLET | Freq: Every day | ORAL | 1 refills | Status: DC

## 2021-11-03 MED ORDER — METFORMIN HCL ER 500 MG PO TB24: ORAL_TABLET | ORAL | 1 refills | Status: DC

## 2021-11-03 MED ORDER — ATORVASTATIN CALCIUM 10 MG PO TABS: 10.0000 mg | ORAL_TABLET | Freq: Every day | ORAL | 1 refills | Status: DC

## 2021-11-03 MED ORDER — PRECISION QID TEST VI STRP: ORAL_STRIP | 12 refills | Status: DC

## 2021-11-03 NOTE — Progress Notes (Signed)
Date:  11/03/2021   Name:  Donna Riley   DOB:  11/18/1960   MRN:  [id number]   Chief Complaint: Diabetes and Hypertension  Diabetes She presents for her follow-up diabetic visit. She has type 2 diabetes mellitus. Pertinent negatives for hypoglycemia include no dizziness, headaches or nervousness/anxiousness. Pertinent negatives for diabetes include no chest pain, no fatigue, no foot paresthesias, no polydipsia, no polyphagia, no polyuria, no visual change and no weight loss. Symptoms are stable. Pertinent negatives for diabetic complications include no CVA, heart disease or nephropathy. Current diabetic treatment includes oral agent (monotherapy). She is compliant with treatment all of the time. Her breakfast blood glucose is taken between 7-8 am. Her breakfast blood glucose range is generally 130-140 mg/dl. An ACE inhibitor/angiotensin II receptor blocker is being taken. Eye exam is current.  Hypertension This is a chronic problem. The current episode started more than 1 year ago. The problem is unchanged. The problem is controlled. Pertinent negatives include no chest pain, headaches, palpitations or shortness of breath. There is no history of CVA.   Lab Results  Component Value Date   NA 134 (L) 09/06/2021   K 3.4 (L) 09/06/2021   CO2 26 09/06/2021   GLUCOSE 133 (H) 09/06/2021   BUN 13 09/06/2021   CREATININE 0.90 09/06/2021   CALCIUM 8.8 (L) 09/06/2021   GFRNONAA >60 09/06/2021   Lab Results  Component Value Date   CHOL 163 07/06/2021   HDL 33 (L) 07/06/2021   LDLCALC 64 07/06/2021   TRIG 331 (H) 07/06/2021   CHOLHDL 4.9 07/06/2021   Lab Results  Component Value Date   TSH 1.765 04/11/2018   Lab Results  Component Value Date   HGBA1C 6.1 (H) 07/06/2021   Lab Results  Component Value Date   WBC 8.7 09/06/2021   HGB 13.0 09/06/2021   HCT 40.2 09/06/2021   MCV 85.2 09/06/2021   PLT 250 09/06/2021   Lab Results  Component Value Date   ALT 19 09/06/2021   AST 23  09/06/2021   ALKPHOS 86 09/06/2021   BILITOT 0.6 09/06/2021   No results found for: 25OHVITD2, 25OHVITD3, VD25OH   Review of Systems  Constitutional:  Negative for chills, fatigue, fever, unexpected weight change and weight loss.  Respiratory:  Negative for chest tightness, shortness of breath and wheezing.   Cardiovascular:  Negative for chest pain and palpitations.  Endocrine: Negative for polydipsia, polyphagia and polyuria.  Neurological:  Negative for dizziness, light-headedness and headaches.  Psychiatric/Behavioral:  Negative for dysphoric mood and sleep disturbance. The patient is not nervous/anxious.    Patient Active Problem List   Diagnosis Date Noted   Genetic testing 06/10/2021   Family history of breast cancer 05/19/2021   Family history of pancreatic cancer 05/19/2021   Family history of kidney cancer 05/19/2021   Family history of throat cancer 05/19/2021   Endometrial polyp    Benign neuroendocrine tumor of sigmoid colon    History of rectal polyps    Nodule of colon    Polyp of ascending colon    Polyp of descending colon    Polyp of sigmoid colon    Sebaceous cyst 08/11/2020   Osteopenia of lumbar spine 07/19/2020   Breast pain, right 07/03/2020   Atherosclerosis of abdominal aorta ([place]) 02/06/2020   Tobacco use disorder, continuous 56/97/9480   Helicobacter pylori gastritis 06/03/2019   Abdominal bloating    Nausea    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   Lumbosacral spondylosis without myelopathy 11/22/2018   Vertigo 10/09/2018   Acute midline low back pain without sciatica 10/09/2018   Hyperlipidemia associated with type 2 diabetes mellitus ([place]) 04/11/2018   Gastroparesis 04/11/2018   Long term (current) use of aromatase inhibitors 12/25/2017   Arthritis    Elevated LFTs    Essential hypertension    Positive PPD, treated    Umbilical hernia without obstruction and without gangrene  07/13/2016   Rectal polyp    Irritable bowel syndrome with both constipation and diarrhea 07/20/2015   Type II diabetes mellitus with complication ([place]) 22/06/5425   Pre-ulcerative calluses 06/22/2015   Neuropathy 06/22/2015   Hot flash, menopausal 04/09/2015   Atherosclerotic peripheral vascular disease with intermittent claudication ([place]) 03/25/2015   Osteoporosis of lumbar spine 03/12/2015   Malignant neoplasm of left breast in female, estrogen receptor positive ([place]) 05/30/2014   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    No Known Allergies  Past Surgical History:  Procedure Laterality Date   BREAST BIOPSY Left 07/31/2014   [facility] and DCIS    BREAST BIOPSY Left 03/08/2016   INTRADUCTAL PAPILLOMA    BREAST LUMPECTOMY Left 07/2014   [facility], DCIS, clear margins, positive LN   BREAST LUMPECTOMY Left 04/05/2016   excision of intraductal papilloma, no malignancy or atypia   BREAST LUMPECTOMY WITH NEEDLE LOCALIZATION Left 04/05/2016   Procedure: BREAST LUMPECTOMY WITH NEEDLE LOCALIZATION;  Surgeon: Hubbard Robinson, MD;  Location: [facility];  Service: General;  Laterality: Left;   BREAST LUMPECTOMY WITH NEEDLE LOCALIZATION AND AXILLARY SENTINEL LYMPH NODE BX Left 08/27/14   CHOLECYSTECTOMY  09/07/2013   COLONOSCOPY WITH PROPOFOL N/A 08/28/2015   Procedure: COLONOSCOPY WITH PROPOFOL;  Surgeon: Lucilla Lame, MD;  Location: [place];  Service: Endoscopy;  Laterality: N/A;  Diabetic oral   COLONOSCOPY WITH PROPOFOL N/A 08/14/2020   Procedure: COLONOSCOPY WITH PROPOFOL;  Surgeon: Virgel Manifold, MD;  Location: [facility];  Service: Endoscopy;  Laterality: N/A;   ENDOSCOPIC MUCOSAL RESECTION N/A 09/10/2020   Procedure: ENDOSCOPIC MUCOSAL RESECTION;  Surgeon: Rush Landmark Telford Nab., MD;  Location: [place];  Service: Gastroenterology;  Laterality: N/A;   ESOPHAGOGASTRODUODENOSCOPY  2014   gastritis   ESOPHAGOGASTRODUODENOSCOPY (EGD) WITH PROPOFOL N/A 04/16/2019   Procedure:  ESOPHAGOGASTRODUODENOSCOPY (EGD) WITH PROPOFOL;  Surgeon: Virgel Manifold, MD;  Location: [facility];  Service: Endoscopy;  Laterality: N/A;   ESOPHAGOGASTRODUODENOSCOPY (EGD) WITH PROPOFOL N/A 10/03/2019   Procedure: ESOPHAGOGASTRODUODENOSCOPY (EGD) WITH PROPOFOL;  Surgeon: Virgel Manifold, MD;  Location: [facility];  Service: Endoscopy;  Laterality: N/A;   EUS N/A 09/10/2020   Procedure: LOWER ENDOSCOPIC ULTRASOUND (EUS);  Surgeon: Irving Copas., MD;  Location: [place];  Service: Gastroenterology;  Laterality: N/A;   FLEXIBLE SIGMOIDOSCOPY N/A 08/27/2020   Procedure: FLEXIBLE SIGMOIDOSCOPY;  Surgeon: Virgel Manifold, MD;  Location: [facility];  Service: Endoscopy;  Laterality: N/A;   FLEXIBLE SIGMOIDOSCOPY N/A 09/10/2020   Procedure: FLEXIBLE SIGMOIDOSCOPY;  Surgeon: Rush Landmark Telford Nab., MD;  Location: [place];  Service: Gastroenterology;  Laterality: N/A;   FOREIGN BODY REMOVAL  09/10/2020   Procedure: FOREIGN BODY REMOVAL ;  Surgeon: Rush Landmark Telford Nab., MD;  Location: [place];  Service: Gastroenterology;;   HEMOSTASIS CLIP PLACEMENT  09/10/2020   Procedure: HEMOSTASIS CLIP PLACEMENT;  Surgeon: Irving Copas., MD;  Location: [place];  Service: Gastroenterology;;   HYSTEROSCOPY WITH D & C N/A 03/25/2021   Procedure: DILATATION AND CURETTAGE [place];  Surgeon: Malachy Mood, MD;  Location: [facility];  Service: Gynecology;  Laterality: N/A;   POLYPECTOMY  08/28/2015   Procedure: POLYPECTOMY INTESTINAL;  Surgeon: Lucilla Lame, MD;  Location: [place];  Service: Endoscopy;;  Rectal polyp   SUBMUCOSAL LIFTING INJECTION  09/10/2020   Procedure: SUBMUCOSAL LIFTING INJECTION;  Surgeon: Irving Copas., MD;  Location: [place];  Service: Gastroenterology;;   TUBAL LIGATION Bilateral    UMBILICAL HERNIA REPAIR N/A 08/09/2016   Procedure: HERNIA REPAIR UMBILICAL ADULT;  Surgeon: Olean Ree, MD;  Location:  [facility];  Service: General;  Laterality: N/A;   VENTRAL HERNIA REPAIR N/A 04/10/2015   Procedure: HERNIA REPAIR VENTRAL ADULT;  Surgeon: Leonie Green, MD;  Location: [facility];  Service: General;  Laterality: N/A;    Social History   Tobacco Use   Smoking status: Every Day    Packs/day: 0.50    Years: 45.00    Pack years: 22.50    Types: Cigarettes   Smokeless tobacco: Never   Tobacco comments:    Smokes 7 ciggs daily.  Vaping Use   Vaping Use: Never used  Substance Use Topics   Alcohol use: Yes    Alcohol/week: 2.0 standard drinks    Types: 2 Cans of beer per week    Comment: weekends - beer    Drug use: No     Medication list has been reviewed and updated.  Current Meds  Medication Sig   acetaminophen (TYLENOL) 325 MG tablet Take 650 mg by mouth daily as needed for moderate pain.   aspirin EC 81 MG tablet Take 81 mg by mouth daily.   Blood Glucose Calibration (TRUE METRIX LEVEL 1) Low SOLN 1 each by In Vitro route daily as needed.   Blood Glucose Monitoring Suppl (TRUE METRIX METER) DEVI 1 each by Does not apply route daily.   calcium-vitamin D (OSCAL WITH D) 500-200 MG-UNIT TABS tablet Take 1 tablet by mouth daily.   HYDROcodone-acetaminophen (NORCO/VICODIN) 5-325 MG tablet Take 1 tablet by mouth every 6 (six) hours as needed.   ibuprofen (ADVIL) 600 MG tablet Take 1 tablet (600 mg total) by mouth every 6 (six) hours as needed for mild pain or cramping.   Multiple Vitamins-Minerals (MULTIVITAMIN WITH MINERALS) tablet Take 1 tablet by mouth daily.   [DISCONTINUED] amLODipine (NORVASC) 10 MG tablet TAKE TWO TABLETS BY MOUTH ONCE DAILY   [DISCONTINUED] atorvastatin (LIPITOR) 10 MG tablet TAKE ONE TABLET BY MOUTH AT BEDTIME   [DISCONTINUED] glucose blood (PRECISION QID TEST) test strip    [DISCONTINUED] irbesartan (AVAPRO) 300 MG tablet TAKE ONE TABLET BY MOUTH ONCE DAILY   [DISCONTINUED] metFORMIN (GLUCOPHAGE-XR) 500 MG 24 hr tablet TAKE ONE TABLET BY MOUTH EVERY  MORNING WITH BREAKFAST   [DISCONTINUED] triamterene-hydrochlorothiazide (MAXZIDE-25) 37.5-25 MG tablet Take 1 tablet by mouth daily.       11/03/2021    8:26 AM 07/06/2021    9:01 AM 07/06/2021    8:58 AM 03/05/2021    8:26 AM  GAD 7 : Generalized Anxiety Score  Nervous, Anxious, on Edge 0 0 0 1  Control/stop worrying 0 0 0 0  Worry too much - different things 0 1 1 0  Trouble relaxing 0 0 0 0  Restless 1 0 0 0  Easily annoyed or irritable 0 0 0 1  Afraid - awful might happen 0 0 0 0  Total GAD 7 Score '1 1 1 2  '$ Anxiety Difficulty Not difficult at all Not difficult at all Not difficult at all  11/03/2021    8:26 AM  Depression screen PHQ 2/9  Decreased Interest 1  Down, Depressed, Hopeless 3  PHQ - 2 Score 4  Altered sleeping 2  Tired, decreased energy 2  Change in appetite 1  Feeling bad or failure about yourself  0  Trouble concentrating 0  Moving slowly or fidgety/restless 1  Suicidal thoughts 0  PHQ-9 Score 10  Difficult doing work/chores Not difficult at all    BP Readings from Last 3 Encounters:  11/03/21 124/70  11/03/21 124/70  09/06/21 (!) 164/72    Physical Exam Vitals and nursing note reviewed.  Constitutional:      General: She is not in acute distress.    Appearance: Normal appearance. She is well-developed.  HENT:     Head: Normocephalic and atraumatic.  Cardiovascular:     Rate and Rhythm: Normal rate and regular rhythm.     Heart sounds: No murmur heard. Pulmonary:     Effort: Pulmonary effort is normal. No respiratory distress.     Breath sounds: No wheezing or rhonchi.  Musculoskeletal:     Cervical back: Normal range of motion.     Right lower leg: No edema.     Left lower leg: No edema.  Lymphadenopathy:     Cervical: No cervical adenopathy.  Skin:    General: Skin is warm and dry.     Findings: No rash.  Neurological:     General: No focal deficit present.     Mental Status: She is alert and oriented to person, place, and time.   Psychiatric:        Mood and Affect: Mood normal.        Behavior: Behavior normal.    Wt Readings from Last 3 Encounters:  11/03/21 210 lb (95.3 kg)  11/03/21 210 lb 3.2 oz (95.3 kg)  09/06/21 205 lb (93 kg)    BP 124/70   Pulse 81   Ht '5\' 8"'$  (1.727 m)   Wt 210 lb 3.2 oz (95.3 kg)   SpO2 98%   BMI 31.96 kg/m   Assessment and Plan: 1. Essential hypertension Clinically stable exam with well controlled BP. Tolerating medications without side effects at this time. Pt to continue current regimen and low sodium diet; benefits of regular exercise as able discussed. - amLODipine (NORVASC) 10 MG tablet; Take 2 tablets (20 mg total) by mouth daily.  Dispense: 180 tablet; Refill: 1 - irbesartan (AVAPRO) 300 MG tablet; Take 1 tablet (300 mg total) by mouth daily.  Dispense: 90 tablet; Refill: 1 - triamterene-hydrochlorothiazide (MAXZIDE-25) 37.5-25 MG tablet; Take 1 tablet by mouth daily.  Dispense: 90 tablet; Refill: 1  2. Type II diabetes mellitus with complication (HCC) Clinically stable by exam and report without s/s of hypoglycemia. DM complicated by hypertension and dyslipidemia. Tolerating medications well without side effects or other concerns. Reminded to schedule DM eye exam - POCT glycosylated hemoglobin (Hb A1C)= 6.5 - glucose blood (PRECISION QID TEST) test strip; Test bid  Dispense: 100 each; Refill: 12 - metFORMIN (GLUCOPHAGE-XR) 500 MG 24 hr tablet; TAKE ONE TABLET BY MOUTH EVERY MORNING WITH BREAKFAST  Dispense: 90 tablet; Refill: 1  3. Hyperlipidemia associated with type 2 diabetes mellitus ([place]) Tolerating statin medication without side effects at this time LDL is at goal of < 70 on current dose Continue same therapy without change at this time. - atorvastatin (LIPITOR) 10 MG tablet; Take 1 tablet (10 mg total) by mouth at bedtime.  Dispense: 90 tablet; Refill: 1  4. Need for vaccination for pneumococcus - Pneumococcal conjugate vaccine 20-valent   Partially  dictated using Editor, commissioning. Any errors are unintentional.  Halina Maidens, MD [facility]  11/03/2021

## 2021-11-03 NOTE — Progress Notes (Signed)
Subjective:   Donna Riley is a 61 y.o. female who presents for an Initial Medicare Annual Wellness Visit. I connected with  Donna Riley on 11/03/21 by an in person visit and verified that I am speaking with the correct person using two identifiers.  Patient Location: Other:  OFFICE  Provider Location: Office/Clinic  I discussed the limitations of evaluation and management by telemedicine. The patient expressed understanding and agreed to proceed.   Review of Systems    Defer to PCP    Objective:    Today's Vitals   11/03/21 0819  BP: 124/70  Pulse: 81  SpO2: 98%  Weight: 210 lb (95.3 kg)  Height: _0  (1.727 m)  PainSc: 0-No pain   Body mass index is 31.93 kg/m.     09/06/2021   10:23 AM 05/06/2021    9:12 AM 04/01/2021    4:56 PM 03/25/2021   10:32 AM 03/17/2021    9:33 AM 12/31/2020   10:19 AM 10/19/2020    8:49 AM  Advanced Directives  Does Patient Have a Medical Advance Directive? _1  No No  Would patient like information on creating a medical advance directive?    No - Patient declined  No - Patient declined     Current Medications (verified) Outpatient Encounter Medications as of 11/03/2021  Medication Sig   acetaminophen (TYLENOL) 325 MG tablet Take 650 mg by mouth daily as needed for moderate pain.   aspirin EC 81 MG tablet Take 81 mg by mouth daily.   Blood Glucose Calibration (TRUE METRIX LEVEL 1) Low SOLN 1 each by In Vitro route daily as needed.   Blood Glucose Monitoring Suppl (TRUE METRIX METER) DEVI 1 each by Does not apply route daily.   calcium-vitamin D (OSCAL WITH D) 500-200 MG-UNIT TABS tablet Take 1 tablet by mouth daily.   HYDROcodone-acetaminophen (NORCO/VICODIN) 5-325 MG tablet Take 1 tablet by mouth every 6 (six) hours as needed.   ibuprofen (ADVIL) 600 MG tablet Take 1 tablet (600 mg total) by mouth every 6 (six) hours as needed for mild pain or cramping.   Multiple Vitamins-Minerals (MULTIVITAMIN WITH MINERALS) tablet Take  1 tablet by mouth daily.   [DISCONTINUED] amLODipine (NORVASC) 10 MG tablet TAKE TWO TABLETS BY MOUTH ONCE DAILY   [DISCONTINUED] atorvastatin (LIPITOR) 10 MG tablet TAKE ONE TABLET BY MOUTH AT BEDTIME   [DISCONTINUED] glucose blood (PRECISION QID TEST) test strip    [DISCONTINUED] irbesartan (AVAPRO) 300 MG tablet TAKE ONE TABLET BY MOUTH ONCE DAILY   [DISCONTINUED] metFORMIN (GLUCOPHAGE-XR) 500 MG 24 hr tablet TAKE ONE TABLET BY MOUTH EVERY MORNING WITH BREAKFAST   [DISCONTINUED] triamterene-hydrochlorothiazide (MAXZIDE-25) 37.5-25 MG tablet Take 1 tablet by mouth daily.   No facility-administered encounter medications on file as of 11/03/2021.    Allergies (verified) Patient has no known allergies.   History: Past Medical History:  Diagnosis Date   Arthritis    shoulders and neck   Breast cancer, left ([place]) 08/04/2014   a.) Bx (+) for invasive mammary carcinoma with DCIS; ER/PR (+), HER2/neu (-). (+) lymphovascular invasion with 1 of 2 lymph nodes being (+). Treated with lumpectomy + adjuvant chemoradiation.   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   Diastolic dysfunction 62/69/4854   a.)  TTE 10/29/2020: EF >55%; G1DD   DOE (dyspnea on exertion)    Elevated LFTs    Endometrial polyp 02/03/2021   a.) Pelvic US --> 18 x 5 x 10 mm lenticular nodule in endrometrial canal; favored endometrial polyp vs. neoplasm. b.) Bx 02/23/2021 --> no dysplasia or malignancy; benign.   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 IHC for cytokeratin AE1/AE3 and CD56 supports.   Positive PPD, treated    T2DM (type 2 diabetes mellitus) ([place]) 06/2014   Valvular regurgitation 10/29/2020   a.) TTE 10/29/2020: EF >55%; trivial AR/TR/PR, mild MR   Past Surgical History:  Procedure Laterality Date   BREAST BIOPSY Left 07/31/2014   [facility] and DCIS    BREAST BIOPSY Left 03/08/2016   INTRADUCTAL PAPILLOMA    BREAST LUMPECTOMY Left 07/2014   [facility], DCIS, clear margins, positive LN   BREAST LUMPECTOMY Left 04/05/2016   excision of intraductal papilloma, no malignancy or atypia   BREAST LUMPECTOMY WITH NEEDLE LOCALIZATION Left 04/05/2016   Procedure: BREAST LUMPECTOMY WITH NEEDLE LOCALIZATION;  Surgeon: Hubbard Robinson, MD;  Location: [facility];  Service: General;  Laterality: Left;   BREAST LUMPECTOMY WITH NEEDLE LOCALIZATION AND AXILLARY SENTINEL LYMPH NODE BX Left 08/27/14   CHOLECYSTECTOMY  09/07/2013   COLONOSCOPY WITH PROPOFOL N/A 08/28/2015   Procedure: COLONOSCOPY WITH PROPOFOL;  Surgeon: Lucilla Lame, MD;  Location: [place];  Service: Endoscopy;  Laterality: N/A;  Diabetic oral   COLONOSCOPY WITH PROPOFOL N/A 08/14/2020   Procedure: COLONOSCOPY WITH PROPOFOL;  Surgeon: Virgel Manifold, MD;  Location: [facility];  Service: Endoscopy;  Laterality: N/A;   ENDOSCOPIC MUCOSAL RESECTION N/A 09/10/2020   Procedure: ENDOSCOPIC MUCOSAL RESECTION;  Surgeon: Rush Landmark Telford Nab., MD;  Location: [place];  Service: Gastroenterology;  Laterality: N/A;   ESOPHAGOGASTRODUODENOSCOPY  2014   gastritis   ESOPHAGOGASTRODUODENOSCOPY (EGD) WITH PROPOFOL N/A 04/16/2019   Procedure: ESOPHAGOGASTRODUODENOSCOPY (EGD) WITH PROPOFOL;  Surgeon: Virgel Manifold, MD;  Location: [facility];  Service: Endoscopy;  Laterality: N/A;   ESOPHAGOGASTRODUODENOSCOPY (EGD) WITH PROPOFOL N/A 10/03/2019   Procedure:  ESOPHAGOGASTRODUODENOSCOPY (EGD) WITH PROPOFOL;  Surgeon: Virgel Manifold, MD;  Location: [facility];  Service: Endoscopy;  Laterality: N/A;   EUS N/A 09/10/2020   Procedure: LOWER ENDOSCOPIC ULTRASOUND (EUS);  Surgeon: Irving Copas., MD;  Location: [place];  Service: Gastroenterology;  Laterality: N/A;   FLEXIBLE SIGMOIDOSCOPY N/A 08/27/2020   Procedure: FLEXIBLE SIGMOIDOSCOPY;  Surgeon: Virgel Manifold, MD;  Location: [facility];  Service: Endoscopy;  Laterality: N/A;   FLEXIBLE SIGMOIDOSCOPY N/A 09/10/2020   Procedure: FLEXIBLE SIGMOIDOSCOPY;  Surgeon: Rush Landmark Telford Nab., MD;  Location: [place];  Service: Gastroenterology;  Laterality: N/A;   FOREIGN BODY REMOVAL  09/10/2020   Procedure: FOREIGN BODY REMOVAL ;  Surgeon: Rush Landmark Telford Nab., MD;  Location: [place];  Service: Gastroenterology;;   HEMOSTASIS CLIP PLACEMENT  09/10/2020   Procedure: HEMOSTASIS CLIP PLACEMENT;  Surgeon: Irving Copas., MD;  Location: [place];  Service: Gastroenterology;;   HYSTEROSCOPY WITH D & C N/A 03/25/2021   Procedure:  DILATATION AND CURETTAGE /HYSTEROSCOPY;  Surgeon: Malachy Mood, MD;  Location: [facility];  Service: Gynecology;  Laterality: N/A;   POLYPECTOMY  08/28/2015   Procedure: POLYPECTOMY INTESTINAL;  Surgeon: Lucilla Lame, MD;  Location: [place];  Service: Endoscopy;;  Rectal polyp   SUBMUCOSAL LIFTING INJECTION  09/10/2020   Procedure: SUBMUCOSAL LIFTING INJECTION;  Surgeon: Irving Copas., MD;  Location: [place];  Service: Gastroenterology;;   TUBAL LIGATION Bilateral    UMBILICAL HERNIA REPAIR N/A 08/09/2016   Procedure: HERNIA REPAIR UMBILICAL ADULT;  Surgeon: Olean Ree, MD;  Location: [facility];  Service: General;  Laterality: N/A;   VENTRAL HERNIA REPAIR N/A 04/10/2015   Procedure: HERNIA REPAIR VENTRAL ADULT;  Surgeon: Leonie Green, MD;  Location: [facility];  Service: General;  Laterality: N/A;    Family History  Problem Relation Age of Onset   Cancer Mother 77       Pancreatic   Cancer Father        Throat dx 60s   Healthy Sister    Cancer Sister 107       unk metastatic   Kidney cancer Sister 77   HIV Brother    Breast cancer Maternal Grandmother 28   Breast cancer Cousin        d. 41s   Social History   Socioeconomic History   Marital status: Widowed    Spouse name: Not on file   Number of children: 4   Years of education: Not on file   Highest education level: 10th grade  Occupational History   Occupation: Disabled  Tobacco Use   Smoking status: Every Day    Packs/day: 0.50    Years: 45.00    Pack years: 22.50    Types: Cigarettes   Smokeless tobacco: Never   Tobacco comments:    Smokes 7 ciggs daily.  Vaping Use   Vaping Use: Never used  Substance and Sexual Activity   Alcohol use: Yes    Alcohol/week: 2.0 standard drinks    Types: 2 Cans of beer per week    Comment: weekends - beer    Drug use: No   Sexual activity: Not Currently  Other Topics Concern   Not on file  Social History Narrative   Pt lives alone, son lives 2 doors down.   Social Determinants of Health   Financial Resource Strain: Not on file  Food Insecurity: Not on file  Transportation Needs: Not on file  Physical Activity: Not on file  Stress: No Stress Concern Present   Feeling of Stress : Not at all  Social Connections: Socially Isolated   Frequency of Communication with Friends and Family: More than three times a week   Frequency of Social Gatherings with Friends and Family: More than three times a week   Attends Religious Services: Never   Marine scientist or Organizations: No   Attends Archivist Meetings: Never   Marital Status: Widowed    Tobacco Counseling Ready to quit: Not Answered Counseling given: Not Answered Tobacco comments: Smokes 7 ciggs daily.   Clinical Intake:  Pre-visit preparation completed: Yes  Pain : No/denies pain Pain  Score: 0-No pain     BMI - recorded: 31.93 Nutritional Status: BMI > 30  Obese Nutritional Risks: None Diabetes: Yes CBG done?: No (Monday BS was 128 Fasting)     Diabetic? YES- BS Monday was 138 fasting.  Interpreter Needed?: No  Information entered by :: Donna Riley, Donna Riley   Activities  of Daily Living    07/06/2021    9:01 AM 07/06/2021    8:58 AM  In your present state of health, do you have any difficulty performing the following activities:  Hearing? 0 0  Vision? 0 0  Difficulty concentrating or making decisions? 0 0  Walking or climbing stairs? 1 1  Dressing or bathing? 0 0  Doing errands, shopping? 0 0    Patient Care Team: Glean Hess, MD as PCP - General (Internal Medicine) Virgel Manifold, MD (Inactive) as Consulting Physician (Gastroenterology) Samara Deist, DPM as Referring Physician (Podiatry) [facility] (Ophthalmology) Malachy Mood, MD as Referring Physician (Obstetrics and Gynecology) Glean Hess, MD (Internal Medicine)  Indicate any recent Medical Services you may have received from other than [facility] providers in the past year (date may be approximate).     Assessment:   This is a routine wellness examination for Donna Riley.  Hearing/Vision screen No issues with Hearing or Vision.  Dietary issues and exercise activities discussed:     Goals Addressed   None   Depression Screen    11/03/2021    8:26 AM 07/06/2021    9:01 AM 07/06/2021    8:58 AM 03/05/2021    8:26 AM 11/03/2020    8:18 AM 10/12/2020    3:54 PM 07/24/2020    8:39 AM  PHQ 2/9 Scores  PHQ - 2 Score 4 0 0 0 0 0 0  PHQ- 9 Score _0 0 0    Fall Risk    11/03/2021    8:26 AM 07/06/2021    9:01 AM 07/06/2021    8:58 AM 03/05/2021    8:27 AM 11/03/2020    8:18 AM  Fall Risk   Falls in the past year? 0 0 0 0 0  Number falls in past yr: 0 0 0 0 0  Injury with Fall? 0 0 0 0 0  Risk for fall due to : No Fall Risks No Fall Risks No Fall Risks No Fall Risks    Follow up _1     FALL RISK PREVENTION PERTAINING TO THE HOME:  Any stairs in or around the home? No  If so, are there any without handrails?  N/A Home free of loose throw rugs in walkways, pet beds, electrical cords, etc? Yes  Adequate lighting in your home to reduce risk of falls? Yes   ASSISTIVE DEVICES UTILIZED TO PREVENT FALLS:  Life alert? No  Use of a cane, walker or w/c? No  Grab bars in the bathroom? No  Shower chair or bench in shower? No  Elevated toilet seat or a handicapped toilet? No   TIMED UP AND GO:  Was the test performed? No .  Length of time to ambulate 10 feet: N/A sec.   Gait steady and fast with assistive device  Cognitive Function:        11/03/2021    8:21 AM 07/10/2019   10:18 AM 07/09/2018   11:01 AM 05/18/2017   10:49 AM  6CIT Screen  What Year? 0 points 0 points 0 points 0 points  What month? 0 points 0 points 0 points 0 points  What time? 0 points 0 points 0 points 0 points  Count back from 20 2 points 0 points 0 points 0 points  Months in reverse 0 points 0 points 0 points 2 points  Repeat phrase 2 points  0 points 0 points 8 points  Total Score 4 points 0 points 0 points 10 points    Immunizations Immunization History  Administered Date(s) Administered   Influenza Inj Mdck Quad With Preservative 03/12/2018   Influenza,inj,Quad PF,6+ Mos 02/22/2016, 02/18/2019, 03/05/2021   Influenza-Unspecified 01/30/2014, 02/25/2017, 03/12/2018   PFIZER(Purple Top)SARS-COV-2 Vaccination 10/21/2019, 11/13/2019   PNEUMOCOCCAL CONJUGATE-20 11/03/2021   Pneumococcal Polysaccharide-23 01/25/2017    TDAP status: Due, Education has been provided regarding the importance of this vaccine. Advised may receive this vaccine at local pharmacy or Health Dept. Aware to provide a copy of the vaccination record if obtained from local  pharmacy or Health Dept. Verbalized acceptance and understanding.  Flu Vaccine status: Up to date  Pneumococcal vaccine status: Completed during today's visit.  Covid-19 vaccine status: Information provided on how to obtain vaccines.   Qualifies for Shingles Vaccine? Yes   Zostavax completed No   Shingrix Completed?: No.    Education has been provided regarding the importance of this vaccine. Patient has been advised to call insurance company to determine out of pocket expense if they have not yet received this vaccine. Advised may also receive vaccine at local pharmacy or Health Dept. Verbalized acceptance and understanding.  Screening Tests Health Maintenance  Topic Date Due   TETANUS/TDAP  Never done   Zoster Vaccines- Shingrix (1 of 2) Never done   COVID-19 Vaccine (3 - Pfizer risk series) 12/11/2019   OPHTHALMOLOGY EXAM  11/07/2021   INFLUENZA VACCINE  12/28/2021   HEMOGLOBIN A1C  01/03/2022   MAMMOGRAM  07/02/2022   FOOT EXAM  07/06/2022   COLONOSCOPY (Pts 45-65yr Insurance coverage will need to be confirmed)  08/14/2025   PAP SMEAR-Modifier  02/23/2026   DEXA SCAN  03/03/2030   Pneumococcal Vaccine 114663Years old  Completed   HIV Screening  Completed   Hepatitis C Screening  Addressed   HPV VACCINES  Aged Out    Health Maintenance  Health Maintenance Due  Topic Date Due   TETANUS/TDAP  Never done   Zoster Vaccines- Shingrix (1 of 2) Never done   COVID-19 Vaccine (3 - Pfizer risk series) 12/11/2019    Colorectal cancer screening: Type of screening: Colonoscopy. Completed 08/14/2020. Repeat every 5 years  Mammogram status: Completed 07/02/2021. Repeat every year  Bone Density status: Completed 03/03/2020. Results reflect: Bone density results: OSTEOPENIA. Repeat every 2 years.  Lung Cancer Screening: (Low Dose CT Chest recommended if Age 62-80years, 30 pack-year currently smoking OR have quit w/in 15years.) does qualify.   Lung Cancer Screening Referral:  Patient completed on 01/2021. Will repeat yearly.  Additional Screening:  Hepatitis C Screening: does qualify; Completed 08/28/2013  Vision Screening: Recommended annual ophthalmology exams for early detection of glaucoma and other disorders of the eye. Is the patient up to date with their annual eye exam?  No  Who is the provider or what is the name of the office in which the patient attends annual eye exams[facility] [place] is not established with a provider, would they like to be referred to a provider to establish care? No .   Dental Screening: Recommended annual dental exams for proper oral hygiene  Community Resource Referral / Chronic Care Management: CRR required this visit?  No   CCM required this visit?  No      Plan:     I have personally reviewed and noted the following in the patient's chart:   Medical and social history Use of  alcohol, tobacco or illicit drugs  Current medications and supplements including opioid prescriptions. Patient is currently taking opioid prescriptions. Information provided to patient regarding non-opioid alternatives. Patient advised to discuss non-opioid treatment plan with their provider. Functional ability and status Nutritional status Physical activity Advanced directives List of other physicians Hospitalizations, surgeries, and ER visits in previous 12 months Vitals Screenings to include cognitive, depression, and falls Referrals and appointments  In addition, I have reviewed and discussed with patient certain preventive protocols, quality metrics, and best practice recommendations. A written personalized care plan for preventive services as well as general preventive health recommendations were provided to patient.     Donna Riley, Donna Riley   11/03/2021   Nurse Notes: Face to face 25 minute encounter.   Donna Riley , Thank you for taking time to come for your Medicare Wellness Visit. I appreciate your  ongoing commitment to your health goals. Please review the following plan we discussed and let me know if I can assist you in the future.   These are the goals we discussed:  Goals      Exercise 150 min/wk Moderate Activity     Recommend to exercise at least 150 minutes per week       Quit smoking / using tobacco     If you wish to quit smoking, help is available. For free tobacco cessation program offerings call the [facility] at 207-841-0647 or Live Well Line at (803)766-4609. You may also visit [email or web address].com or email [email or web address] for more information on other programs.          This is a list of the screening recommended for you and due dates:  Health Maintenance  Topic Date Due   Tetanus Vaccine  Never done   Zoster (Shingles) Vaccine (1 of 2) Never done   COVID-19 Vaccine (3 - Pfizer risk series) 12/11/2019   Eye exam for diabetics  11/07/2021   Flu Shot  12/28/2021   Hemoglobin A1C  01/03/2022   Mammogram  07/02/2022   Complete foot exam   07/06/2022   Colon Cancer Screening  08/14/2025   Pap Smear  02/23/2026   DEXA scan (bone density measurement)  03/03/2030   Pneumococcal Vaccination  Completed   HIV Screening  Completed   Hepatitis C Screening: USPSTF Recommendation to screen - Ages 30-79 yo.  Addressed   HPV Vaccine  Aged Out

## 2021-11-04 ENCOUNTER — Other Ambulatory Visit: Payer: Self-pay

## 2021-11-04 DIAGNOSIS — E118 Type 2 diabetes mellitus with unspecified complications: Secondary | ICD-10-CM

## 2021-11-04 MED ORDER — GLUCOSE BLOOD VI STRP: ORAL_STRIP | 12 refills | Status: DC

## 2021-11-11 ENCOUNTER — Ambulatory Visit (HOSPITAL_BASED_OUTPATIENT_CLINIC_OR_DEPARTMENT_OTHER): Payer: Medicare Other | Admitting: Anesthesiology

## 2021-11-11 ENCOUNTER — Other Ambulatory Visit: Payer: Self-pay

## 2021-11-11 ENCOUNTER — Encounter (HOSPITAL_COMMUNITY): Payer: Self-pay | Admitting: Gastroenterology

## 2021-11-11 ENCOUNTER — Ambulatory Visit (HOSPITAL_COMMUNITY)
Admission: RE | Admit: 2021-11-11 | Discharge: 2021-11-11 | Disposition: A | Discharge status: Home / Self Care | Payer: Medicare Other | Source: Ambulatory Visit | Attending: Gastroenterology | Admitting: Gastroenterology

## 2021-11-11 ENCOUNTER — Encounter (HOSPITAL_COMMUNITY)
Admission: RE | Disposition: A | Discharge status: Home / Self Care | Payer: Self-pay | Source: Ambulatory Visit | Attending: Gastroenterology

## 2021-11-11 ENCOUNTER — Ambulatory Visit (HOSPITAL_COMMUNITY): Payer: Medicare Other | Admitting: Anesthesiology

## 2021-11-11 DIAGNOSIS — I899 Noninfective disorder of lymphatic vessels and lymph nodes, unspecified: Secondary | ICD-10-CM

## 2021-11-11 DIAGNOSIS — Z85048 Personal history of other malignant neoplasm of rectum, rectosigmoid junction, and anus: Secondary | ICD-10-CM | POA: Diagnosis not present

## 2021-11-11 DIAGNOSIS — I10 Essential (primary) hypertension: Secondary | ICD-10-CM | POA: Insufficient documentation

## 2021-11-11 DIAGNOSIS — M1990 Unspecified osteoarthritis, unspecified site: Secondary | ICD-10-CM | POA: Insufficient documentation

## 2021-11-11 DIAGNOSIS — K644 Residual hemorrhoidal skin tags: Secondary | ICD-10-CM | POA: Insufficient documentation

## 2021-11-11 DIAGNOSIS — K6289 Other specified diseases of anus and rectum: Secondary | ICD-10-CM | POA: Diagnosis not present

## 2021-11-11 DIAGNOSIS — Z85040 Personal history of malignant carcinoid tumor of rectum: Secondary | ICD-10-CM | POA: Diagnosis not present

## 2021-11-11 DIAGNOSIS — I739 Peripheral vascular disease, unspecified: Secondary | ICD-10-CM | POA: Diagnosis not present

## 2021-11-11 DIAGNOSIS — K641 Second degree hemorrhoids: Secondary | ICD-10-CM | POA: Diagnosis not present

## 2021-11-11 DIAGNOSIS — E119 Type 2 diabetes mellitus without complications: Secondary | ICD-10-CM | POA: Diagnosis not present

## 2021-11-11 DIAGNOSIS — I2510 Atherosclerotic heart disease of native coronary artery without angina pectoris: Secondary | ICD-10-CM | POA: Diagnosis not present

## 2021-11-11 DIAGNOSIS — K649 Unspecified hemorrhoids: Secondary | ICD-10-CM | POA: Diagnosis not present

## 2021-11-11 DIAGNOSIS — F17210 Nicotine dependence, cigarettes, uncomplicated: Secondary | ICD-10-CM | POA: Insufficient documentation

## 2021-11-11 DIAGNOSIS — Z09 Encounter for follow-up examination after completed treatment for conditions other than malignant neoplasm: Secondary | ICD-10-CM | POA: Insufficient documentation

## 2021-11-11 DIAGNOSIS — E1151 Type 2 diabetes mellitus with diabetic peripheral angiopathy without gangrene: Secondary | ICD-10-CM | POA: Diagnosis not present

## 2021-11-11 DIAGNOSIS — Z9889 Other specified postprocedural states: Secondary | ICD-10-CM | POA: Insufficient documentation

## 2021-11-11 DIAGNOSIS — Z08 Encounter for follow-up examination after completed treatment for malignant neoplasm: Secondary | ICD-10-CM

## 2021-11-11 DIAGNOSIS — J449 Chronic obstructive pulmonary disease, unspecified: Secondary | ICD-10-CM | POA: Diagnosis not present

## 2021-11-11 DIAGNOSIS — C20 Malignant neoplasm of rectum: Secondary | ICD-10-CM | POA: Diagnosis not present

## 2021-11-11 HISTORY — PX: FLEXIBLE SIGMOIDOSCOPY: SHX5431

## 2021-11-11 HISTORY — PX: EUS: SHX5427

## 2021-11-11 HISTORY — PX: BIOPSY: SHX5522

## 2021-11-11 LAB — GLUCOSE, CAPILLARY
Glucose-Capillary: 133 mg/dL — ABNORMAL HIGH (ref 70–99)
Glucose-Capillary: 119 mg/dL — ABNORMAL HIGH (ref 70–99)

## 2021-11-11 SURGERY — ULTRASOUND, LOWER GI TRACT, ENDOSCOPIC
Anesthesia: Monitor Anesthesia Care

## 2021-11-11 MED ORDER — OXYCODONE HCL 5 MG/5ML PO SOLN: 5.0000 mg | Freq: Once | ORAL | Status: DC | PRN

## 2021-11-11 MED ORDER — PHENYLEPHRINE HCL (PRESSORS) 10 MG/ML IV SOLN
INTRAVENOUS | Status: DC | PRN
  Administered 2021-11-11: 80 ug via INTRAVENOUS

## 2021-11-11 MED ORDER — LIDOCAINE 2% (20 MG/ML) 5 ML SYRINGE
INTRAMUSCULAR | Status: DC | PRN
  Administered 2021-11-11: 40 mg via INTRAVENOUS

## 2021-11-11 MED ORDER — PROPOFOL 500 MG/50ML IV EMUL
INTRAVENOUS | Status: DC | PRN
  Administered 2021-11-11: 125 ug/kg/min via INTRAVENOUS

## 2021-11-11 MED ORDER — OXYCODONE HCL 5 MG PO TABS: 5.0000 mg | ORAL_TABLET | Freq: Once | ORAL | Status: DC | PRN

## 2021-11-11 MED ORDER — PROPOFOL 10 MG/ML IV BOLUS
INTRAVENOUS | Status: DC | PRN
  Administered 2021-11-11 (×2): 10 mg via INTRAVENOUS
  Administered 2021-11-11: 20 mg via INTRAVENOUS

## 2021-11-11 MED ORDER — FENTANYL CITRATE (PF) 100 MCG/2ML IJ SOLN: 25.0000 ug | INTRAMUSCULAR | Status: DC | PRN

## 2021-11-11 MED ORDER — ONDANSETRON HCL 4 MG/2ML IJ SOLN: 4.0000 mg | Freq: Four times a day (QID) | INTRAMUSCULAR | Status: DC | PRN

## 2021-11-11 MED ORDER — LACTATED RINGERS IV SOLN: INTRAVENOUS | Status: DC | PRN

## 2021-11-11 NOTE — Transfer of Care (Signed)
Immediate Anesthesia Transfer of Care Note  Patient: Donna Riley  Procedure(s) Performed: LOWER ENDOSCOPIC ULTRASOUND (EUS) FLEXIBLE SIGMOIDOSCOPY BIOPSY  Patient Location: PACU  Anesthesia Type:MAC  Level of Consciousness: awake, alert  and oriented  Airway & Oxygen Therapy: Patient connected to nasal cannula oxygen  Post-op Assessment: Post -op Vital signs reviewed and stable  Post vital signs: stable  Last Vitals:  Vitals Value Taken Time  BP    Temp    Pulse 69 11/11/21 0945  Resp 23 11/11/21 0945  SpO2 100 % 11/11/21 0945  Vitals shown include unvalidated device data.  Last Pain:  Vitals:   11/11/21 0811  TempSrc: Oral  PainSc: 0-No pain         Complications: No notable events documented.

## 2021-11-11 NOTE — Op Note (Signed)
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 Bonna Gains MD, MD, Lucilla Lame MD, MD, Halina Maidens, MD Medicines:                Monitored Anesthesia Care Complications:            No immediate complications. Estimated Blood Loss:     Estimated blood loss was minimal. Procedure:                Pre-Anesthesia Assessment:                           - Prior to the procedure, a History and Physical                            was performed, and patient medications and                            allergies were reviewed. The patient's tolerance of                            previous anesthesia was also reviewed. The risks                            and benefits of the procedure and the sedation                            options and risks were discussed with the patient.                            All questions were answered, and informed consent                            was obtained. Prior Anticoagulants: The patient has  taken no previous anticoagulant or antiplatelet                            agents except for aspirin. ASA Grade Assessment: II                            - A patient with mild systemic disease. After                            reviewing the risks and benefits, the patient was                            deemed in satisfactory condition to undergo the                             procedure.                           After obtaining informed consent, the endoscope was                            passed under direct vision. Throughout the                            procedure, the patient's blood pressure, pulse, and                            oxygen saturations were monitored continuously. The                            [id number] ([id number]) Therapeutic endoscope was                            introduced through the anus and advanced to the the                            descending colon. The [id number] ([id number])                            Olympus EUS scope was introduced through the anus                            and advanced to the the sigmoid colon for                            ultrasound. The lower EUS was accomplished without                            difficulty. The patient tolerated the procedure.                            The quality of the bowel preparation was adequate. Scope In: 9:14:08 AM Scope Out: 9:41:57 AM Total Procedure Duration: 0 hours 27 minutes 48 seconds  Findings:  The digital rectal exam findings include hemorrhoids. Pertinent       negatives include no palpable rectal lesions.      ENDOSCOPIC FINDING: :      Normal mucosa was found in the recto-sigmoid colon and in the sigmoid       colon.      A medium post mucosectomy scar was found in the proximal rectum with the       base showing no evidence/concern of abnormality. Adjacent mucosal       findings included tattoo discoloration. After EUS was completed,       biopsies were taken with a cold forceps for histology to ensure no       recurrence.      Non-bleeding non-thrombosed external and internal hemorrhoids were found       during retroflexion, during perianal exam and during digital exam. The       hemorrhoids were Grade II (internal hemorrhoids that prolapse but reduce       spontaneously).      ENDOSONOGRAPHIC FINDING: :      Local wall thickening was found  in the rectum. This was encountered from       13 to 14 cm (from the anal verge). The site of thickening was       non-circumferential. This is at scar site. This finding appeared to be       primarily due to increased thickness of the luminal       interface/superficial mucosa (Layer 1) and deep mucosa (Layer 2).      The rectum was otherwise normal in endosonographic appearance.      No malignant-appearing lymph nodes were visualized in the perirectal       region and in the left iliac region. The nodes were.      The rectosigmoid junction and sigmoid colon were normal.      The internal anal sphincter was visualized endosonographically and       appeared normal. Impression:               FLEX Impression:                           - Hemorrhoids found on digital rectal exam.                           - Normal mucosa in the recto-sigmoid colon and in                            the sigmoid colon.                           - Post mucosectomy scar in the proximal rectum from                            prior EMR of Rectal NET. Biopsied after EUS                            completed.                           - Non-bleeding non-thrombosed external and internal  hemorrhoids.                           EUS Impression:                           - Local wall thickening was visualized                            endosonographically in the rectum. This was due to                            increased thickness of the luminal                            interface/superficial mucosa (Layer 1) and deep                            mucosa (Layer 2).                           - Endosonographic images of the rectum were                            unremarkable.                           - No malignant-appearing lymph nodes were                            visualized endosonographically in the perirectal                            region and in the left iliac region.                            - Endosonographic imaging showed no sign of                            significant pathology at the rectosigmoid junction                            and in the sigmoid colon.                           - The internal anal sphincter was visualized                            endosonographically and appeared normal.                           - Endosonographic images of the rectum were                            otherwise unremarkable. Recommendation:           - The patient will be observed post-procedure,  until all discharge criteria are met.                           - Discharge patient to home.                           - Patient has a contact number available for                            emergencies. The signs and symptoms of potential                            delayed complications were discussed with the                            patient. Return to normal activities tomorrow.                            Written discharge instructions were provided to the                            patient.                           - Resume previous diet.                           - Await path results.                           - As long as no pathology suggestive of recurrent                            NET, recommend repeat Lower EUS in 2-years. She                            will be due for full colonoscopy at that time, and                            that can be pursued together due to history of                            multiple TAs. If evidence of recurrent NET, then                            will need additional EMR attempt vs FTRD of the                            region.                           - Follow up with Dr. Tasia Catchings of Oncology in interim.                           - Other GI  issues, she may follow with [facility] (her                            local GI).                           - The findings and recommendations were discussed                             with the patient.                           - The findings and recommendations were discussed                            with the patient's family. Procedure Code(s):        --- Professional ---                           [phone], Sigmoidoscopy, flexible; with endoscopic                            ultrasound examination                           60454, Sigmoidoscopy, flexible; with biopsy, single                            or multiple Diagnosis Code(s):        --- Professional ---                           K64.1, Second degree hemorrhoids                           Z98.890, Other specified postprocedural states                           K62.89, Other specified diseases of anus and rectum                           I89.9, Noninfective disorder of lymphatic vessels                            and lymph nodes, unspecified                           Z08, Encounter for follow-up examination after                            completed treatment for malignant neoplasm                           Z85.048, Personal history of other malignant                            neoplasm of rectum, rectosigmoid junction, and anus CPT copyright  2019 American Medical Association. All rights reserved. The codes documented in this report are preliminary and upon coder review may  be revised to meet current compliance requirements. Justice Britain, MD 11/11/2021 10:01:50 AM Number of Addenda: 0

## 2021-11-11 NOTE — Anesthesia Postprocedure Evaluation (Signed)
Anesthesia Post Note  Patient: Donna Riley  Procedure(s) Performed: LOWER ENDOSCOPIC ULTRASOUND (EUS) FLEXIBLE SIGMOIDOSCOPY BIOPSY     Patient location during evaluation: Endoscopy Anesthesia Type: MAC Level of consciousness: awake and alert Pain management: pain level controlled Vital Signs Assessment: post-procedure vital signs reviewed and stable Respiratory status: spontaneous breathing, nonlabored ventilation, respiratory function stable and patient connected to nasal cannula oxygen Cardiovascular status: stable and blood pressure returned to baseline Postop Assessment: no apparent nausea or vomiting Anesthetic complications: no   No notable events documented.  Last Vitals:  Vitals:   11/11/21 0945 11/11/21 1000  BP: (!) 104/46 125/64  Pulse: 73 81  Resp: (!) 24 (!) 21  Temp: 36.6 C   SpO2: 100% 98%    Last Pain:  Vitals:   11/11/21 1000  TempSrc:   PainSc: 0-No pain                 Phinehas Grounds S

## 2021-11-11 NOTE — Anesthesia Procedure Notes (Signed)
Procedure Name: MAC Date/Time: 11/11/2021 9:34 AM  Performed by: Lavell Luster, CRNAPre-anesthesia Checklist: Patient identified, Emergency Drugs available, Suction available, Patient being monitored and Timeout performed Patient Re-evaluated:Patient Re-evaluated prior to induction Oxygen Delivery Method: Nasal cannula Preoxygenation: Pre-oxygenation with 100% oxygen Induction Type: IV induction Placement Confirmation: breath sounds checked- equal and bilateral and positive ETCO2 Dental Injury: Teeth and Oropharynx as per pre-operative assessment

## 2021-11-11 NOTE — Anesthesia Preprocedure Evaluation (Signed)
Anesthesia Evaluation  Patient identified by MRN, date of birth, ID band Patient awake    Reviewed: Allergy & Precautions, H&P , NPO status , Patient's Chart, lab work & pertinent test results  Airway Mallampati: II   Neck ROM: full    Dental   Pulmonary COPD, Current Smoker,    breath sounds clear to auscultation       Cardiovascular hypertension, + CAD and + Peripheral Vascular Disease   Rhythm:regular Rate:Normal     Neuro/Psych    GI/Hepatic GERD  ,  Endo/Other  diabetes, Type 2  Renal/GU      Musculoskeletal  (+) Arthritis ,   Abdominal   Peds  Hematology   Anesthesia Other Findings   Reproductive/Obstetrics                             Anesthesia Physical Anesthesia Plan  ASA: 2  Anesthesia Plan: MAC   Post-op Pain Management:    Induction: Intravenous  PONV Risk Score and Plan: 1 and Propofol infusion and Treatment may vary due to age or medical condition  Airway Management Planned: Nasal Cannula  Additional Equipment:   Intra-op Plan:   Post-operative Plan:   Informed Consent: I have reviewed the patients History and Physical, chart, labs and discussed the procedure including the risks, benefits and alternatives for the proposed anesthesia with the patient or authorized representative who has indicated his/her understanding and acceptance.     Dental advisory given  Plan Discussed with: CRNA, Anesthesiologist and Surgeon  Anesthesia Plan Comments:         Anesthesia Quick Evaluation

## 2021-11-11 NOTE — H&P (Signed)
GASTROENTEROLOGY PROCEDURE H&P NOTE   Primary Care Physician: Glean Hess, MD  HPI: Donna Riley is a 61 y.o. 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  Past Medical History:  Diagnosis Date   Arthritis    shoulders and neck   Breast cancer, left ([place]) 08/04/2014   a.) Bx (+) for invasive mammary carcinoma with DCIS; ER/PR (+), HER2/neu (-). (+) lymphovascular invasion with 1 of 2 lymph nodes being (+). Treated with lumpectomy + adjuvant chemoradiation.   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   Diastolic dysfunction 84/66/5993   a.)  TTE 10/29/2020: EF >55%; G1DD   DOE (dyspnea on exertion)    Elevated LFTs    Endometrial polyp 02/03/2021   a.) Pelvic US --> 18 x 5 x 10 mm lenticular nodule in endrometrial canal; favored endometrial polyp vs. neoplasm. b.) Bx 02/23/2021 --> no dysplasia or malignancy; benign.   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 IHC for cytokeratin AE1/AE3 and CD56 supports.   Positive PPD, treated    T2DM (type 2 diabetes mellitus) ([place]) 06/2014   Valvular regurgitation 10/29/2020   a.) TTE 10/29/2020: EF >55%; trivial AR/TR/PR, mild MR   Past Surgical History:  Procedure Laterality Date   BREAST BIOPSY Left 07/31/2014   [facility] and DCIS    BREAST BIOPSY Left 03/08/2016   INTRADUCTAL PAPILLOMA    BREAST  LUMPECTOMY Left 07/2014   [facility], DCIS, clear margins, positive LN   BREAST LUMPECTOMY Left 04/05/2016   excision of intraductal papilloma, no malignancy or atypia   BREAST LUMPECTOMY WITH NEEDLE LOCALIZATION Left 04/05/2016   Procedure: BREAST LUMPECTOMY WITH NEEDLE LOCALIZATION;  Surgeon: Hubbard Robinson, MD;  Location: [facility];  Service: General;  Laterality: Left;   BREAST LUMPECTOMY WITH NEEDLE LOCALIZATION AND AXILLARY SENTINEL LYMPH NODE BX Left 08/27/14   CHOLECYSTECTOMY  09/07/2013   COLONOSCOPY WITH PROPOFOL N/A 08/28/2015   Procedure: COLONOSCOPY WITH PROPOFOL;  Surgeon: Lucilla Lame, MD;  Location: [place];  Service: Endoscopy;  Laterality: N/A;  Diabetic oral   COLONOSCOPY WITH PROPOFOL N/A 08/14/2020   Procedure: COLONOSCOPY WITH PROPOFOL;  Surgeon: Virgel Manifold, MD;  Location: [facility];  Service: Endoscopy;  Laterality: N/A;   ENDOSCOPIC MUCOSAL RESECTION N/A 09/10/2020   Procedure: ENDOSCOPIC MUCOSAL RESECTION;  Surgeon: Rush Landmark Telford Nab., MD;  Location: [place];  Service: Gastroenterology;  Laterality: N/A;   ESOPHAGOGASTRODUODENOSCOPY  2014   gastritis   ESOPHAGOGASTRODUODENOSCOPY (EGD) WITH PROPOFOL N/A 04/16/2019   Procedure: ESOPHAGOGASTRODUODENOSCOPY (EGD) WITH PROPOFOL;  Surgeon: Virgel Manifold, MD;  Location: [facility];  Service: Endoscopy;  Laterality: N/A;   ESOPHAGOGASTRODUODENOSCOPY (EGD) WITH PROPOFOL N/A 10/03/2019   Procedure: ESOPHAGOGASTRODUODENOSCOPY (EGD) WITH PROPOFOL;  Surgeon: Virgel Manifold, MD;  Location: [facility];  Service: Endoscopy;  Laterality: N/A;   EUS N/A 09/10/2020   Procedure: LOWER ENDOSCOPIC ULTRASOUND (EUS);  Surgeon: Irving Copas., MD;  Location: [place];  Service: Gastroenterology;  Laterality: N/A;   FLEXIBLE SIGMOIDOSCOPY N/A 08/27/2020   Procedure: FLEXIBLE SIGMOIDOSCOPY;  Surgeon: Virgel Manifold, MD;  Location: [facility];  Service: Endoscopy;  Laterality: N/A;    FLEXIBLE SIGMOIDOSCOPY N/A 09/10/2020   Procedure: FLEXIBLE SIGMOIDOSCOPY;  Surgeon: Rush Landmark Telford Nab., MD;  Location: [place];  Service: Gastroenterology;  Laterality: N/A;   FOREIGN BODY REMOVAL  09/10/2020   Procedure: FOREIGN BODY REMOVAL ;  Surgeon: Rush Landmark Telford Nab., MD;  Location: [place];  Service: Gastroenterology;;   HEMOSTASIS CLIP PLACEMENT  09/10/2020   Procedure: HEMOSTASIS CLIP PLACEMENT;  Surgeon: Irving Copas., MD;  Location: [place];  Service: Gastroenterology;;   HYSTEROSCOPY WITH D & C N/A 03/25/2021   Procedure: DILATATION AND CURETTAGE [place];  Surgeon: Malachy Mood, MD;  Location: [facility];  Service: Gynecology;  Laterality: N/A;   POLYPECTOMY  08/28/2015   Procedure: POLYPECTOMY INTESTINAL;  Surgeon: Lucilla Lame, MD;  Location: [place];  Service: Endoscopy;;  Rectal polyp   SUBMUCOSAL LIFTING INJECTION  09/10/2020   Procedure: SUBMUCOSAL LIFTING INJECTION;  Surgeon: Irving Copas., MD;  Location: [place];  Service: Gastroenterology;;   TUBAL LIGATION Bilateral    UMBILICAL HERNIA REPAIR N/A 08/09/2016   Procedure: HERNIA REPAIR UMBILICAL ADULT;  Surgeon: Olean Ree, MD;  Location: [facility];  Service: General;  Laterality: N/A;   VENTRAL HERNIA REPAIR N/A 04/10/2015   Procedure: HERNIA REPAIR VENTRAL ADULT;  Surgeon: Leonie Green, MD;  Location: [facility];  Service: General;  Laterality: N/A;   No current facility-administered medications for this encounter.   No current facility-administered medications for this encounter. No Known Allergies Family History  Problem Relation Age of Onset   Cancer Mother 21       Pancreatic   Cancer Father        Throat dx 45s   Healthy Sister    Cancer Sister 58       unk metastatic   Kidney cancer Sister 8   HIV Brother    Breast cancer Maternal Grandmother 37   Breast cancer Cousin        d. 47s   Social History   Socioeconomic History    Marital status: Widowed    Spouse name: Not on file   Number of children: 4   Years of education: Not on file   Highest education level: 10th grade  Occupational History   Occupation: Disabled  Tobacco Use   Smoking status: Every Day    Packs/day: 0.50    Years: 45.00    Total pack years: 22.50    Types: Cigarettes   Smokeless tobacco: Never   Tobacco comments:    Smokes 7 ciggs daily.  Vaping Use   Vaping Use: Never used  Substance and Sexual Activity   Alcohol use: Yes    Alcohol/week: 2.0 standard drinks of alcohol    Types: 2 Cans of beer per week    Comment: weekends - beer    Drug use: No   Sexual activity: Not Currently  Other Topics Concern   Not on file  Social History Narrative   Pt lives alone, son lives 2 doors down.   Social Determinants of Health   Financial Resource Strain: Low Risk  (07/13/2020)   Overall Financial Resource Strain (CARDIA)    Difficulty of Paying Living  Expenses: Not very hard  Food Insecurity: No Food Insecurity (07/13/2020)   Hunger Vital Sign    Worried About Running Out of Food in the Last Year: Never true    Ran Out of Food in the Last Year: Never true  Transportation Needs: No Transportation Needs (07/13/2020)   PRAPARE - Hydrologist (Medical): No    Lack of Transportation (Non-Medical): No  Physical Activity: Inactive (07/13/2020)   Exercise Vital Sign    Days of Exercise per Week: 0 days    Minutes of Exercise per Session: 0 min  Stress: No Stress Concern Present (11/03/2021)   [place]    Feeling of Stress : Not at all  Social Connections: Socially Isolated (11/03/2021)   Social Connection and Isolation Panel [NHANES]    Frequency of Communication with Friends and Family: More than three times a week    Frequency of Social Gatherings with Friends and Family: More than three times a week    Attends Religious Services: Never    Building surveyor or Organizations: No    Attends Archivist Meetings: Never    Marital Status: Widowed  Intimate Partner Violence: Not At Risk (07/13/2020)   Humiliation, Afraid, Rape, and Kick questionnaire    Fear of Current or Ex-Partner: No    Emotionally Abused: No    Physically Abused: No    Sexually Abused: No    Physical Exam: Today's Vitals   11/11/21 0811  BP: (!) 150/55  Pulse: 74  Resp: 19  Temp: 98.7 F (37.1 C)  TempSrc: Oral  SpO2: 100%  Weight: 95.3 kg  Height: $Remove'5\' 8"'EQSnTGM$  (1.727 m)  PainSc: 0-No pain   Body mass index is 31.95 kg/m. GEN: NAD EYE: Sclerae anicteric ENT: MMM CV: Non-tachycardic GI: Soft, NT/ND NEURO:  Alert & Oriented x 3  Lab Results: No results for input(s): "WBC", "HGB", "HCT", "PLT" in the last 72 hours. BMET No results for input(s): "NA", "K", "CL", "CO2", "GLUCOSE", "BUN", "CREATININE", "CALCIUM" in the last 72 hours. LFT No results for input(s): "PROT", "ALBUMIN", "AST", "ALT", "ALKPHOS", "BILITOT", "BILIDIR", "IBILI" in the last 72 hours. PT/INR No results for input(s): "LABPROT", "INR" in the last 72 hours.   Impression / Plan: This is a 61 y.o.female  who presents for Lower EUS/Sigmoidoscopy for follow up of Rectal NET s/p EMR in 2022 with 1 mm margin resection.  The risks and benefits of endoscopic evaluation/treatment were discussed with the patient and/or family; these include but are not limited to the risk of perforation, infection, bleeding, missed lesions, lack of diagnosis, severe illness requiring hospitalization, as well as anesthesia and sedation related illnesses.  The patient's history has been reviewed, patient examined, no change in status, and deemed stable for procedure.  The patient and/or family is agreeable to proceed.    Justice Britain, MD [facility] Office # [phone]

## 2021-11-12 ENCOUNTER — Encounter: Payer: Self-pay | Admitting: Gastroenterology

## 2021-11-12 DIAGNOSIS — M47817 Spondylosis without myelopathy or radiculopathy, lumbosacral region: Secondary | ICD-10-CM | POA: Diagnosis not present

## 2021-11-12 LAB — SURGICAL PATHOLOGY

## 2021-11-14 ENCOUNTER — Encounter (HOSPITAL_COMMUNITY): Payer: Self-pay | Admitting: Gastroenterology

## 2021-11-25 DIAGNOSIS — M2041 Other hammer toe(s) (acquired), right foot: Secondary | ICD-10-CM | POA: Diagnosis not present

## 2021-11-25 DIAGNOSIS — M79672 Pain in left foot: Secondary | ICD-10-CM | POA: Diagnosis not present

## 2021-11-25 DIAGNOSIS — E119 Type 2 diabetes mellitus without complications: Secondary | ICD-10-CM | POA: Diagnosis not present

## 2021-11-25 DIAGNOSIS — M2042 Other hammer toe(s) (acquired), left foot: Secondary | ICD-10-CM | POA: Diagnosis not present

## 2021-11-25 DIAGNOSIS — M79671 Pain in right foot: Secondary | ICD-10-CM | POA: Diagnosis not present

## 2021-11-25 DIAGNOSIS — D2371 Other benign neoplasm of skin of right lower limb, including hip: Secondary | ICD-10-CM | POA: Diagnosis not present

## 2022-01-26 DIAGNOSIS — E119 Type 2 diabetes mellitus without complications: Secondary | ICD-10-CM | POA: Diagnosis not present

## 2022-01-26 DIAGNOSIS — H35013 Changes in retinal vascular appearance, bilateral: Secondary | ICD-10-CM | POA: Diagnosis not present

## 2022-01-26 DIAGNOSIS — H04123 Dry eye syndrome of bilateral lacrimal glands: Secondary | ICD-10-CM | POA: Diagnosis not present

## 2022-01-26 LAB — HM DIABETES EYE EXAM

## 2022-02-16 ENCOUNTER — Ambulatory Visit
Admission: RE | Admit: 2022-02-16 | Discharge: 2022-02-16 | Disposition: A | Discharge status: Home / Self Care | Payer: Medicare Other | Source: Ambulatory Visit | Attending: Acute Care | Admitting: Acute Care

## 2022-02-16 DIAGNOSIS — Z122 Encounter for screening for malignant neoplasm of respiratory organs: Secondary | ICD-10-CM | POA: Diagnosis not present

## 2022-02-16 DIAGNOSIS — F17210 Nicotine dependence, cigarettes, uncomplicated: Secondary | ICD-10-CM | POA: Insufficient documentation

## 2022-02-16 DIAGNOSIS — J439 Emphysema, unspecified: Secondary | ICD-10-CM | POA: Insufficient documentation

## 2022-02-16 DIAGNOSIS — Z87891 Personal history of nicotine dependence: Secondary | ICD-10-CM | POA: Diagnosis not present

## 2022-02-16 DIAGNOSIS — I700 Atherosclerosis of aorta: Secondary | ICD-10-CM | POA: Diagnosis not present

## 2022-02-17 ENCOUNTER — Telehealth: Payer: Self-pay | Admitting: Acute Care

## 2022-02-17 ENCOUNTER — Other Ambulatory Visit: Payer: Self-pay | Admitting: Internal Medicine

## 2022-02-17 ENCOUNTER — Other Ambulatory Visit: Payer: Self-pay

## 2022-02-17 ENCOUNTER — Encounter: Payer: Self-pay | Admitting: Internal Medicine

## 2022-02-17 DIAGNOSIS — J432 Centrilobular emphysema: Secondary | ICD-10-CM | POA: Insufficient documentation

## 2022-02-17 DIAGNOSIS — Z87891 Personal history of nicotine dependence: Secondary | ICD-10-CM

## 2022-02-17 DIAGNOSIS — R911 Solitary pulmonary nodule: Secondary | ICD-10-CM

## 2022-02-17 DIAGNOSIS — F17210 Nicotine dependence, cigarettes, uncomplicated: Secondary | ICD-10-CM

## 2022-02-17 NOTE — Telephone Encounter (Signed)
Returned call to patient. She wanted to know if a biopsy would be needed for the nodule in question.  She has concerns about this being cancer since she has had breast cancer. Advised the radiology reading states its likely benign and its just a precaution to look at the nodule again sooner than the yearly scan would be done.  The area is very small and would not be easy to biopsy, which is why we recommend taking a look at it again in a few months.  Patient acknowledged understanding.  She sees her oncologist soon and will also discuss with her.  Patient had no further questions.

## 2022-02-17 NOTE — Telephone Encounter (Signed)
I have called the patient with the results of the low dose Ct Chest screening scan. I explained that her scan was read as a Lung  RADS 3, nodules that are probably benign findings, short term follow up suggested: includes nodules with a low likelihood of becoming a clinically active cancer. Radiology recommends a 6 month repeat LDCT follow up.  There is a new 4.1 mm right upper lobe pulmonary nodule . Plan is for a follow up LDCT  In 6 months, which will be 06/2022. I explained that we will call to get this scheduled closer to the time.  Langley Gauss, 6 month follow up low dose CT Chest 06/2022. Please fax results to PCP and let them know plan for follow up. Thanks so much

## 2022-02-17 NOTE — Telephone Encounter (Signed)
Received a call report from [place] for patient's lung cancer screening CT that was completed yesterday. Below is a copy of the impression:     IMPRESSION: 1. Lung-RADS 3, probably benign findings. Short-term follow-up in 6 months is recommended with repeat low-dose chest CT without contrast (please use the following order, "CT CHEST LCS NODULE FOLLOW-UP W/O CM"). 2.  Emphysema (ICD10-J43.9) and Aortic Atherosclerosis (ICD10-170.0)  Will route to Judson Roch and the lung nodule pool.

## 2022-02-17 NOTE — Telephone Encounter (Signed)
Results/plan faxed to PCP and new order placed for CT nodule follow up 6 months

## 2022-02-17 NOTE — Telephone Encounter (Signed)
Patient is returning a call regarding her test results.  Patient would really like a call back today if at all possible.  CB# [phone].

## 2022-03-01 DIAGNOSIS — I10 Essential (primary) hypertension: Secondary | ICD-10-CM | POA: Diagnosis not present

## 2022-03-01 DIAGNOSIS — M79605 Pain in left leg: Secondary | ICD-10-CM | POA: Diagnosis not present

## 2022-03-01 DIAGNOSIS — I70219 Atherosclerosis of native arteries of extremities with intermittent claudication, unspecified extremity: Secondary | ICD-10-CM | POA: Diagnosis not present

## 2022-03-01 DIAGNOSIS — I700 Atherosclerosis of aorta: Secondary | ICD-10-CM | POA: Diagnosis not present

## 2022-03-01 DIAGNOSIS — I25118 Atherosclerotic heart disease of native coronary artery with other forms of angina pectoris: Secondary | ICD-10-CM | POA: Diagnosis not present

## 2022-03-01 DIAGNOSIS — E782 Mixed hyperlipidemia: Secondary | ICD-10-CM | POA: Diagnosis not present

## 2022-03-01 DIAGNOSIS — I739 Peripheral vascular disease, unspecified: Secondary | ICD-10-CM | POA: Diagnosis not present

## 2022-03-04 ENCOUNTER — Ambulatory Visit: Payer: Medicare Other | Admitting: Internal Medicine

## 2022-03-07 ENCOUNTER — Encounter: Payer: Self-pay | Admitting: Oncology

## 2022-03-07 ENCOUNTER — Inpatient Hospital Stay: Payer: Medicare Other | Attending: Oncology

## 2022-03-07 ENCOUNTER — Inpatient Hospital Stay (HOSPITAL_BASED_OUTPATIENT_CLINIC_OR_DEPARTMENT_OTHER): Payer: Medicare Other | Admitting: Oncology

## 2022-03-07 VITALS — BP 139/64 | HR 70 | Temp 98.2°F | Resp 18 | Wt 211.5 lb

## 2022-03-07 DIAGNOSIS — M8580 Other specified disorders of bone density and structure, unspecified site: Secondary | ICD-10-CM

## 2022-03-07 DIAGNOSIS — C7A8 Other malignant neuroendocrine tumors: Secondary | ICD-10-CM | POA: Insufficient documentation

## 2022-03-07 DIAGNOSIS — Z720 Tobacco use: Secondary | ICD-10-CM | POA: Insufficient documentation

## 2022-03-07 DIAGNOSIS — Z87891 Personal history of nicotine dependence: Secondary | ICD-10-CM | POA: Diagnosis not present

## 2022-03-07 DIAGNOSIS — Z853 Personal history of malignant neoplasm of breast: Secondary | ICD-10-CM | POA: Diagnosis not present

## 2022-03-07 DIAGNOSIS — Z170 Estrogen receptor positive status [ER+]: Secondary | ICD-10-CM

## 2022-03-07 DIAGNOSIS — R911 Solitary pulmonary nodule: Secondary | ICD-10-CM | POA: Diagnosis not present

## 2022-03-07 DIAGNOSIS — C50912 Malignant neoplasm of unspecified site of left female breast: Secondary | ICD-10-CM | POA: Diagnosis not present

## 2022-03-07 LAB — COMPREHENSIVE METABOLIC PANEL
ALT: 21 U/L (ref 0–44)
AST: 24 U/L (ref 15–41)
Albumin: 4.1 g/dL (ref 3.5–5.0)
Alkaline Phosphatase: 95 U/L (ref 38–126)
Anion gap: 9 (ref 5–15)
BUN: 16 mg/dL (ref 8–23)
CO2: 22 mmol/L (ref 22–32)
Calcium: 9 mg/dL (ref 8.9–10.3)
Chloride: 105 mmol/L (ref 98–111)
Creatinine, Ser: 0.73 mg/dL (ref 0.44–1.00)
GFR, Estimated: >60 mL/min (ref >60)
Glucose, Bld: 143 mg/dL — ABNORMAL HIGH (ref 70–99)
Potassium: 3.6 mmol/L (ref 3.5–5.1)
Sodium: 136 mmol/L (ref 135–145)
Total Bilirubin: 0.4 mg/dL (ref 0.3–1.2)
Total Protein: 7.4 g/dL (ref 6.5–8.1)

## 2022-03-07 LAB — CBC WITH DIFFERENTIAL/PLATELET
Abs Immature Granulocytes: 0.03 10*3/uL (ref 0.00–0.07)
Basophils Absolute: 0.1 10*3/uL (ref 0.0–0.1)
Basophils Relative: 1 %
Eosinophils Absolute: 0.1 10*3/uL (ref 0.0–0.5)
Eosinophils Relative: 1 %
HCT: 43.2 % (ref 36.0–46.0)
Hemoglobin: 14.2 g/dL (ref 12.0–15.0)
Immature Granulocytes: 0 %
Lymphocytes Relative: 39 %
Lymphs Abs: 3.2 10*3/uL (ref 0.7–4.0)
MCH: 27.4 pg (ref 26.0–34.0)
MCHC: 32.9 g/dL (ref 30.0–36.0)
MCV: 83.4 fL (ref 80.0–100.0)
Monocytes Absolute: 0.4 10*3/uL (ref 0.1–1.0)
Monocytes Relative: 5 %
Neutro Abs: 4.4 10*3/uL (ref 1.7–7.7)
Neutrophils Relative %: 54 %
Platelets: 247 10*3/uL (ref 150–400)
RBC: 5.18 MIL/uL — ABNORMAL HIGH (ref 3.87–5.11)
RDW: 14.2 % (ref 11.5–15.5)
WBC: 8.3 10*3/uL (ref 4.0–10.5)
nRBC: 0 % (ref 0.0–0.2)

## 2022-03-07 NOTE — Assessment & Plan Note (Signed)
Extensive smoking history Follow-up with [place] lung cancer screening program. Recommend smoke cessation.

## 2022-03-07 NOTE — Assessment & Plan Note (Signed)
Bone density on 03/03/2020 revealed osteopenia with a T-score of -2.2 (improved).  Continue calcium and vitamin D.

## 2022-03-07 NOTE — Progress Notes (Signed)
Pt reports that she having soreness to left breast and right breast feels heavy. Gown provided to pt for breast exam, per pt request

## 2022-03-07 NOTE — Assessment & Plan Note (Signed)
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 No T stage on pathology Status post resection.  Negative though close margin 0. 1 mm. June 2023 status post [place].  Continue follow-up with milliradian in 2 years Continue annual CT scan surveillance

## 2022-03-07 NOTE — Assessment & Plan Note (Addendum)
History of Stage IIA LEFT breast cancer Finished 5 years of endocrine therapy on 03/13/2020 BCI testing on 08/20/2019 revealed no benefit from extended endocrine therapy Labs are reviewed and discussed with patient. Physical examination did not reveal any palpable breast mass or other new breast findings.  She has chronic tenderness of her breasts.  Reassurance provided. Continue annual mammogram-February 2024 Patient declines lymphedema clinic referral.

## 2022-03-07 NOTE — Progress Notes (Signed)
Hematology Oncology Progress Note   ASSESSMENT & PLAN:   Malignant neoplasm of left breast in female, estrogen receptor positive ([place]) History of Stage IIA LEFT breast cancer Finished 5 years of endocrine therapy on 03/13/2020 BCI testing on 08/20/2019 revealed no benefit from extended endocrine therapy Labs are reviewed and discussed with patient. Physical examination did not reveal any palpable breast mass or other new breast findings.  She has chronic tenderness of her breasts.  Reassurance provided. Continue annual mammogram-February 2024 Patient declines lymphedema clinic referral.  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 No T stage on pathology Status post resection.  Negative though close margin 0. 1 mm. June 2023 status post [place].  Continue follow-up with milliradian in 2 years Continue annual CT scan surveillance  Lung nodule No nodule found on CT chest lung cancer screening.  Lung-RADS 3, probably benign findings Recommend to repeat CT in 6 months.  Tobacco use Extensive smoking history Follow-up with [place] lung cancer screening program. Recommend smoke cessation.  Osteopenia Bone density on 03/03/2020 revealed osteopenia with a T-score of -2.2 (improved).  Continue calcium and vitamin D.  Orders Placed This Encounter  Procedures   CT ABDOMEN PELVIS W CONTRAST    Standing Status:   Future    Standing Expiration Date:   03/08/2023    Order Specific Question:   If indicated for the ordered procedure, I authorize the administration of contrast media per Radiology protocol    Answer:   Yes    Order Specific Question:   Preferred imaging location?    Answer:   [facility]    Order Specific Question:   Is Oral Contrast requested for this exam?    Answer:   Yes, Per Radiology protocol    Order Specific Question:   Radiology Contrast Protocol - do NOT remove file path    Answer:    [email or web address]   MM 3D SCREEN BREAST BILATERAL    Standing Status:   Future    Standing Expiration Date:   03/08/2023    Order Specific Question:   Reason for Exam (SYMPTOM  OR DIAGNOSIS REQUIRED)    Answer:   history of breast cancer    Order Specific Question:   Preferred imaging location?    Answer:   [facility]   Follow up 6 months.  All questions were answered. The patient knows to call the clinic with any problems, questions or concerns.  Earlie Server, MD, PhD [facility] Hematology Oncology 03/07/2022    Chief Complaint: Donna Riley is a 61 y.o. female presents for follow-up of stage IIA left breast cancer and rectal neuroendocrine carcinoma.  PERTINENT ONCOLOGY HISTORY Patient previously followed up by Dr.Corcoran, patient switched care to me on 12/31/20 Extensive medical record review was performed by me  #stage IIA left breast cancer  08/27/2014 partial mastectomy and sentinel lymph node biopsy on 08/27/2014.  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  There was lymphovascular invasion.  One of 2 sentinel lymph nodes were positive  (focus of 2.8 mm).  Tumor was > 90% ER positive, > 90% PR positive, and Her2/neu negative.  Pathologic stage was T1bN1aM0.   Bone scan on 09/16/2014 revealed abnormal focal uptake at the level of right L3 pedicle and L5 vertebral body.  Lumbar spine MRI on 09/27/2014 revealed no evidence of metastatic disease with lower lumbar facet arthritis.  Abdominal and pelvic  CT scan on 09/24/2014 revealed hepatomegaly and no evidence of metastatic disease.     Adjuvant chemotherapy  4 cycles of Taxotere and Cytoxan (09/29/2014 - 12/09/2014) with Neulasta support. She received 50.4 Gy to the left breast from 01/12/2015 until 02/23/2015.    03/12/2015, started on letrozole but switched to Arimidex on 03/30/2015 secondary to diffuse joint aches.    Arimidex completed around 03/13/2020.  BCI testing on 08/20/2019 revealed an estimated risk of late recurrence between years 5-10 of 10.1% (95% CI: 5.6-14.3%).  She is not likely to benefit from extended endocrine therapy.   She had chronic left nipple discharge.  Left breast lumpectomy at the 11 o'clock position on 04/05/2016 revealed an intraductal papilloma with sclerosis and calcifications. There was fat necrosis with fibrosis and calcifications. Pathology was negative for atypia and malignancy  Health maintenance Colonoscopy on 08/28/2015 revealed one 4 mm polyp in the rectum (tubular adenoma). EGD on 10/03/2019 revealed erythematous mucosa in the antrum. Pathology revealed antral and oxyntic mucosa with moderate chronic inactive gastritis. H. Pylori was negative. There was no intestinal metaplasia, dysplasia, and malignancy.   #Osteoporosis Bone density study on 09/15/2014 revealed osteopenia with a T-score of -2.1 at L1-L4.  Bone density on 01/17/2018 revealed osteoporosis with a T-score of -2.6 in the AP spine L1-L4.  Bone density on 03/03/2020 revealed osteopenia with a T score of -2.2 at the lumbar spine. She is on calcium and vitamin D.   #Lung cancer screening  She has a smoking history.  Low dose chest CT on 02/05/2020 revealed Lung-RADS 2S, benign appearance or behavior.  There was aortic atherosclerosis, in addition to left anterior descending coronary artery disease. There was mild diffuse bronchial wall thickening with mild centrilobular and paraseptal emphysema suggestive of underlying COPD.  08/09/2020 colonoscopy by Dr. Bonna Gains showed 5 polyp in ascending colon, descending colon and sigmoid colon resected and retrieved-pathology showed tubular adenoma, multiple fragments.  Negative for high-grade dysplasia and malignancy. Nodule in the sigmoid colon, biopsied.  Clip was placed.-Biopsy showed well-differentiated neuroendocrine tumor.  Grade 1.  08/27/2020, flexible  sigmoidoscopy showed mucosal nodule in the sigmoid colon, tattooed.  Nonbleeding internal hemorrhoids.    09/10/2020  Flex Sigmoidoscopy/Lower EUS by Dr. Rush Landmark, and EMR  Pathology showed proximal rectum EMR Well differentiated neuroendocrine tumor-carcinoid.  Neuroendocrine tumor less 10.1 cm from cauterized margin. 1.2 x 1 x 0.8 cm.  Dr. Rush Landmark felt to be a complete resection and recommend 1 year follow-up lower EUS. 09/30/2020 CT abdomen pelvis showed sutures near the rectosigmoid junction consistent with history.  Hepatic steatosis.  09/30/2020 Chromogranin A level at 49.6.  07/02/21 mammogram No mammographic evidence of malignancy.  INTERVAL HISTORY Donna Riley is a 61 y.o. female who has above history reviewed by me today presents for follow up for history of breast cancer as well as rectal neuroendocrine carcinoma. Patient reports left breast/axillary area tenderness.  Right breast heaviness.  Denies cough, shortness of breath, abdominal pain, blood in the stool.   11/11/2021, patient had repeat EUS/sigmoidoscopy for follow-up of the rectal NET.  Per patient, no abnormality was found.  EUS report was not available in EMR.   Past Medical History:  Diagnosis Date   Abdominal bloating    Acute midline low back pain without sciatica 10/09/2018   Arthritis    shoulders and neck   Breast cancer, left ([place]) 08/04/2014   a.) Bx (+) for invasive mammary carcinoma with DCIS; ER/PR (+), HER2/neu (-). (+) lymphovascular invasion with 1 of 2 lymph  nodes being (+). Treated with lumpectomy + adjuvant chemoradiation.   Breast wound, left, sequela 06/23/2016   Cancer of gastrointestinal tract ([place]) 08/2020   COPD (chronic obstructive pulmonary disease) (H[facility])    no inhalers   Coronary artery disease    a.) TTE and MPI normal in 10/2020; EF > 55%.   Diastolic dysfunction 23/55/7322   a.)  TTE 10/29/2020: EF >55%; G1DD   DOE (dyspnea on exertion)    Elevated LFTs    Endometrial polyp  02/03/2021   a.) Pelvic US --> 18 x 5 x 10 mm lenticular nodule in endrometrial canal; favored endometrial polyp vs. neoplasm. b.) Bx 02/23/2021 --> no dysplasia or malignancy; benign.   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 IHC for cytokeratin AE1/AE3 and CD56 supports.   Polyp of ascending colon    Polyp of descending colon    Polyp of sigmoid colon    Positive PPD, treated    Rectal polyp    T2DM (type 2 diabetes mellitus) ([place]) 06/2014   Valvular regurgitation 10/29/2020   a.) TTE 10/29/2020: EF >55%; trivial AR/TR/PR, mild MR    Past Surgical History:  Procedure Laterality Date   BIOPSY  11/11/2021   Procedure: BIOPSY;  Surgeon: Irving Copas., MD;  Location: [place];  Service: Gastroenterology;;   BREAST BIOPSY Left 07/31/2014   [place] and DCIS    BREAST BIOPSY Left 03/08/2016   INTRADUCTAL PAPILLOMA    BREAST LUMPECTOMY Left 07/2014   IDC, DCIS, clear margins, positive LN   BREAST LUMPECTOMY Left 04/05/2016   excision of intraductal papilloma, no malignancy or atypia   BREAST LUMPECTOMY WITH NEEDLE LOCALIZATION Left 04/05/2016   Procedure: BREAST LUMPECTOMY WITH NEEDLE LOCALIZATION;  Surgeon: Hubbard Robinson, MD;  Location: [facility];  Service: General;  Laterality: Left;   BREAST LUMPECTOMY WITH NEEDLE LOCALIZATION AND AXILLARY SENTINEL LYMPH NODE BX Left 08/27/14   CHOLECYSTECTOMY  09/07/2013   COLONOSCOPY WITH PROPOFOL N/A 08/28/2015   Procedure: COLONOSCOPY WITH PROPOFOL;  Surgeon: Lucilla Lame, MD;  Location: [place];  Service: Endoscopy;   Laterality: N/A;  Diabetic oral   COLONOSCOPY WITH PROPOFOL N/A 08/14/2020   Procedure: COLONOSCOPY WITH PROPOFOL;  Surgeon: Virgel Manifold, MD;  Location: [facility];  Service: Endoscopy;  Laterality: N/A;   ENDOSCOPIC MUCOSAL RESECTION N/A 09/10/2020   Procedure: ENDOSCOPIC MUCOSAL RESECTION;  Surgeon: Rush Landmark Telford Nab., MD;  Location: [place];  Service: Gastroenterology;  Laterality: N/A;   ESOPHAGOGASTRODUODENOSCOPY  2014   gastritis   ESOPHAGOGASTRODUODENOSCOPY (EGD) WITH PROPOFOL N/A 04/16/2019   Procedure: ESOPHAGOGASTRODUODENOSCOPY (EGD) WITH PROPOFOL;  Surgeon: Virgel Manifold, MD;  Location: [facility];  Service: Endoscopy;  Laterality: N/A;   ESOPHAGOGASTRODUODENOSCOPY (EGD) WITH PROPOFOL N/A 10/03/2019   Procedure: ESOPHAGOGASTRODUODENOSCOPY (EGD) WITH PROPOFOL;  Surgeon: Virgel Manifold, MD;  Location: [facility];  Service: Endoscopy;  Laterality: N/A;   EUS N/A 09/10/2020   Procedure: LOWER ENDOSCOPIC ULTRASOUND (EUS);  Surgeon: Irving Copas., MD;  Location: [place];  Service: Gastroenterology;  Laterality: N/A;  EUS N/A 11/11/2021   Procedure: LOWER ENDOSCOPIC ULTRASOUND (EUS);  Surgeon: Irving Copas., MD;  Location: [place];  Service: Gastroenterology;  Laterality: N/A;   FLEXIBLE SIGMOIDOSCOPY N/A 08/27/2020   Procedure: FLEXIBLE SIGMOIDOSCOPY;  Surgeon: Virgel Manifold, MD;  Location: [facility];  Service: Endoscopy;  Laterality: N/A;   FLEXIBLE SIGMOIDOSCOPY N/A 09/10/2020   Procedure: FLEXIBLE SIGMOIDOSCOPY;  Surgeon: Rush Landmark Telford Nab., MD;  Location: [place];  Service: Gastroenterology;  Laterality: N/A;   FLEXIBLE SIGMOIDOSCOPY N/A 11/11/2021   Procedure: FLEXIBLE SIGMOIDOSCOPY;  Surgeon: Rush Landmark Telford Nab., MD;  Location: [place];  Service: Gastroenterology;  Laterality: N/A;   FOREIGN BODY REMOVAL  09/10/2020   Procedure: FOREIGN BODY REMOVAL ;  Surgeon: Rush Landmark Telford Nab., MD;   Location: [place];  Service: Gastroenterology;;   HEMOSTASIS CLIP PLACEMENT  09/10/2020   Procedure: HEMOSTASIS CLIP PLACEMENT;  Surgeon: Irving Copas., MD;  Location: [place];  Service: Gastroenterology;;   HYSTEROSCOPY WITH D & C N/A 03/25/2021   Procedure: DILATATION AND CURETTAGE [place];  Surgeon: Malachy Mood, MD;  Location: [facility];  Service: Gynecology;  Laterality: N/A;   POLYPECTOMY  08/28/2015   Procedure: POLYPECTOMY INTESTINAL;  Surgeon: Lucilla Lame, MD;  Location: [place];  Service: Endoscopy;;  Rectal polyp   SUBMUCOSAL LIFTING INJECTION  09/10/2020   Procedure: SUBMUCOSAL LIFTING INJECTION;  Surgeon: Irving Copas., MD;  Location: [place];  Service: Gastroenterology;;   TUBAL LIGATION Bilateral    UMBILICAL HERNIA REPAIR N/A 08/09/2016   Procedure: HERNIA REPAIR UMBILICAL ADULT;  Surgeon: Olean Ree, MD;  Location: [facility];  Service: General;  Laterality: N/A;   VENTRAL HERNIA REPAIR N/A 04/10/2015   Procedure: HERNIA REPAIR VENTRAL ADULT;  Surgeon: Leonie Green, MD;  Location: [facility];  Service: General;  Laterality: N/A;    Family History  Problem Relation Age of Onset   Cancer Mother 40       Pancreatic   Cancer Father        Throat dx 51s   Healthy Sister    Cancer Sister 59       unk metastatic   Kidney cancer Sister 85   HIV Brother    Breast cancer Maternal Grandmother 39   Breast cancer Cousin        d. 31s    Social History:  reports that she has been smoking cigarettes. She has a 22.50 pack-year smoking history. She has never used smokeless tobacco. She reports current alcohol use of about 2.0 standard drinks of alcohol per week. She reports that she does not use drugs.  She has cut back on smoking to 6 cigarettes daily. She lost her husband in 05/2018.  She lives in [place]. The patient is alone today.  Allergies: No Known Allergies  Current Medications: Current Outpatient Medications   Medication Sig Dispense Refill   acetaminophen (TYLENOL) 500 MG tablet Take 1,000 mg by mouth every 6 (six) hours as needed for moderate pain.     amLODipine (NORVASC) 10 MG tablet Take 2 tablets (20 mg total) by mouth daily. (Patient taking differently: Take 10 mg by mouth daily.) 180 tablet 1   aspirin EC 81 MG tablet Take 81 mg by mouth daily.     atorvastatin (LIPITOR) 10 MG tablet Take 1 tablet (10 mg total) by mouth at bedtime. 90 tablet 1   Blood Glucose Calibration (TRUE METRIX LEVEL 1) Low SOLN 1 each by In Vitro route daily as needed. 1 each 3   Blood  Glucose Monitoring Suppl (TRUE METRIX METER) DEVI 1 each by Does not apply route daily. 100 Device 3   glucose blood test strip Test BID 100 each 12   irbesartan (AVAPRO) 300 MG tablet Take 1 tablet (300 mg total) by mouth daily. 90 tablet 1   metFORMIN (GLUCOPHAGE-XR) 500 MG 24 hr tablet TAKE ONE TABLET BY MOUTH EVERY MORNING WITH BREAKFAST 90 tablet 1   methocarbamol (ROBAXIN) 500 MG tablet Take 500 mg by mouth at bedtime.     Multiple Vitamins-Minerals (MULTIVITAMIN WITH MINERALS) tablet Take 1 tablet by mouth daily.     triamterene-hydrochlorothiazide (MAXZIDE-25) 37.5-25 MG tablet Take 1 tablet by mouth daily. 90 tablet 1   No current facility-administered medications for this visit.   Review of Systems  Constitutional:  Positive for fatigue. Negative for appetite change, chills and fever.  HENT:   Negative for hearing loss and voice change.   Eyes:  Negative for eye problems.  Respiratory:  Negative for chest tightness and cough.   Cardiovascular:  Negative for chest pain.  Gastrointestinal:  Negative for abdominal distention, abdominal pain and blood in stool.  Endocrine: Negative for hot flashes.  Genitourinary:  Negative for difficulty urinating and frequency.   Musculoskeletal:  Negative for arthralgias.  Skin:  Negative for itching and rash.  Neurological:  Negative for extremity weakness.  Hematological:  Negative for  adenopathy.  Psychiatric/Behavioral:  Negative for confusion.   Left breast and axilla tenderness, right breast heaviness.    Vitals Blood pressure 139/64, pulse 70, temperature 98.2 F (36.8 C), resp. rate 18, weight 211 lb 8 oz (95.9 kg).  Performance status (ECOG): 1 Physical Exam Vitals and nursing note reviewed.  Constitutional:      General: She is not in acute distress.    Appearance: She is well-developed. She is not diaphoretic.  Eyes:     General: No scleral icterus.    Conjunctiva/sclera: Conjunctivae normal.  Cardiovascular:     Rate and Rhythm: Normal rate and regular rhythm.  Pulmonary:     Effort: Pulmonary effort is normal.     Breath sounds: Normal breath sounds.  Abdominal:     General: Abdomen is flat.  Neurological:     Mental Status: She is alert and oriented to person, place, and time.  Psychiatric:        Mood and Affect: Mood normal.    Breast exam was performed in seated and lying down position. History of left lumpectomy with a well-healed surgical scar.  There is tissue thickening at the site of lumpectomy/above and to the right of the nipple.  No palpable mass of right breast.  No palpable axillary lymphadenopathy bilaterally. Tender with palpation of both breast-this is chronic for her.  RADIOGRAPHIC STUDIES: I have personally reviewed the radiological images as listed and agreed with the findings in the report. CT CHEST LUNG CA SCREEN LOW DOSE W/O CM  Result Date: 02/17/2022 CLINICAL DATA:  61 year old female with 35 pack-year history of smoking. Lung cancer screening. EXAM: CT CHEST WITHOUT CONTRAST LOW-DOSE FOR LUNG CANCER SCREENING TECHNIQUE: Multidetector CT imaging of the chest was performed following the standard protocol without IV contrast. RADIATION DOSE REDUCTION: This exam was performed according to the departmental dose-optimization program which includes automated exposure control, adjustment of the mA and/or kV according to patient size  and/or use of iterative reconstruction technique. COMPARISON:  02/16/2021 FINDINGS: Cardiovascular: The heart size is normal. No substantial pericardial effusion. Mild atherosclerotic calcification is noted in the wall of the  thoracic aorta. Mediastinum/Nodes: No mediastinal lymphadenopathy. No evidence for gross hilar lymphadenopathy although assessment is limited by the lack of intravenous contrast on the current study. The esophagus has normal imaging features. There is no axillary lymphadenopathy. Lungs/Pleura: Centrilobular emphsyema noted. Previously identified tiny calcified and noncalcified pulmonary nodules are stable. The new 4.1 mm right upper lobe pulmonary nodule is identified on image 68. No new suspicious pulmonary nodule or mass. No focal airspace consolidation. No pleural effusion. Upper Abdomen: Unremarkable. Musculoskeletal: No worrisome lytic or sclerotic osseous abnormality. Surgical changes are noted in the left breast. IMPRESSION: 1. Lung-RADS 3, probably benign findings. Short-term follow-up in 6 months is recommended with repeat low-dose chest CT without contrast (please use the following order, "CT CHEST LCS NODULE FOLLOW-UP W/O CM"). 2.  Emphysema (ICD10-J43.9) and Aortic Atherosclerosis (ICD10-170.0) These results will be called to the ordering clinician or representative by the Radiologist Assistant, and communication documented in the PACS or [employer]. Electronically Signed   By: Misty Stanley M.D.   On: 02/17/2022 06:29    Labs are reviewed and discussed with patient.    Latest Ref Rng & Units 03/07/2022    9:01 AM 09/06/2021    9:40 AM 07/06/2021    9:12 AM  CBC  WBC 4.0 - 10.5 K/uL 8.3  8.7  8.3   Hemoglobin 12.0 - 15.0 g/dL 14.2  13.0  12.4   Hematocrit 36.0 - 46.0 % 43.2  40.2  38.4   Platelets 150 - 400 K/uL 247  250  252       Latest Ref Rng & Units 03/07/2022    9:01 AM 09/06/2021    9:40 AM 07/06/2021    9:12 AM  CMP  Glucose 70 - 99 mg/dL 143  133  114    BUN 8 - 23 mg/dL $Remove'16  13  14   'UWntIex$ Creatinine 0.44 - 1.00 mg/dL 0.73  0.90  0.81   Sodium 135 - 145 mmol/L 136  134  136   Potassium 3.5 - 5.1 mmol/L 3.6  3.4  3.7   Chloride 98 - 111 mmol/L 105  99  99   CO2 22 - 32 mmol/L $RemoveB'22  26  25   'IPeNWWKK$ Calcium 8.9 - 10.3 mg/dL 9.0  8.8  9.1   Total Protein 6.5 - 8.1 g/dL 7.4  7.4  7.5   Total Bilirubin 0.3 - 1.2 mg/dL 0.4  0.6  0.5   Alkaline Phos 38 - 126 U/L 95  86  71   AST 15 - 41 U/L $Remo'24  23  20   'RMDHS$ ALT 0 - 44 U/L 21  19  19

## 2022-03-07 NOTE — Assessment & Plan Note (Signed)
No nodule found on CT chest lung cancer screening.  Lung-RADS 3, probably benign findings Recommend to repeat CT in 6 months.

## 2022-03-14 DIAGNOSIS — M79605 Pain in left leg: Secondary | ICD-10-CM | POA: Diagnosis not present

## 2022-03-14 DIAGNOSIS — I739 Peripheral vascular disease, unspecified: Secondary | ICD-10-CM | POA: Diagnosis not present

## 2022-03-17 ENCOUNTER — Ambulatory Visit (INDEPENDENT_AMBULATORY_CARE_PROVIDER_SITE_OTHER): Payer: Medicare Other | Admitting: Internal Medicine

## 2022-03-17 ENCOUNTER — Encounter: Payer: Self-pay | Admitting: Internal Medicine

## 2022-03-17 VITALS — BP 128/60 | HR 78 | Ht 68.0 in | Wt 214.0 lb

## 2022-03-17 DIAGNOSIS — J432 Centrilobular emphysema: Secondary | ICD-10-CM

## 2022-03-17 DIAGNOSIS — E118 Type 2 diabetes mellitus with unspecified complications: Secondary | ICD-10-CM

## 2022-03-17 DIAGNOSIS — R911 Solitary pulmonary nodule: Secondary | ICD-10-CM

## 2022-03-17 DIAGNOSIS — I10 Essential (primary) hypertension: Secondary | ICD-10-CM | POA: Diagnosis not present

## 2022-03-17 DIAGNOSIS — Z23 Encounter for immunization: Secondary | ICD-10-CM | POA: Diagnosis not present

## 2022-03-17 LAB — POCT GLYCOSYLATED HEMOGLOBIN (HGB A1C): Hemoglobin A1C: 7 % — AB (ref 4.0–5.6)

## 2022-03-17 MED ORDER — ALBUTEROL SULFATE HFA 108 (90 BASE) MCG/ACT IN AERS: 2.0000 | INHALATION_SPRAY | Freq: Four times a day (QID) | RESPIRATORY_TRACT | 0 refills | Status: DC | PRN

## 2022-03-17 NOTE — Progress Notes (Signed)
Date:  03/17/2022   Name:  Donna Riley   DOB:  07/30/1960   MRN:  [id number]   Chief Complaint: Diabetes, Hypertension, and Shortness of Breath (Has SOB when walking, no wheezing, had a inhaler years ago, does not remember which one )  Diabetes She presents for her follow-up diabetic visit. She has type 2 diabetes mellitus. Her disease course has been stable. Pertinent negatives for hypoglycemia include no headaches or tremors. Pertinent negatives for diabetes include no chest pain, no fatigue, no polydipsia and no polyuria. Pertinent negatives for diabetic complications include no CVA. Current diabetic treatment includes oral agent (monotherapy) (metformin).  Hypertension This is a chronic problem. The problem is controlled. Associated symptoms include shortness of breath. Pertinent negatives include no chest pain, headaches or palpitations. Past treatments include calcium channel blockers, angiotensin blockers and diuretics. Hypertensive end-organ damage includes CAD/MI. There is no history of kidney disease, CVA or heart failure.  Shortness of Breath This is a recurrent problem. The problem occurs intermittently. The problem has been unchanged. The average episode lasts 5 minutes. Pertinent negatives include no abdominal pain, chest pain, fever, headaches, leg swelling, sputum production or wheezing. Risk factors include smoking. She has tried nothing for the symptoms. Her past medical history is significant for CAD. There is no history of asthma or a heart failure.    Lab Results  Component Value Date   NA 136 03/07/2022   K 3.6 03/07/2022   CO2 22 03/07/2022   GLUCOSE 143 (H) 03/07/2022   BUN 16 03/07/2022   CREATININE 0.73 03/07/2022   CALCIUM 9.0 03/07/2022   GFRNONAA >60 03/07/2022   Lab Results  Component Value Date   CHOL 163 07/06/2021   HDL 33 (L) 07/06/2021   LDLCALC 64 07/06/2021   TRIG 331 (H) 07/06/2021   CHOLHDL 4.9 07/06/2021   Lab Results  Component Value  Date   TSH 1.765 04/11/2018   Lab Results  Component Value Date   HGBA1C 6.5 (A) 11/03/2021   Lab Results  Component Value Date   WBC 8.3 03/07/2022   HGB 14.2 03/07/2022   HCT 43.2 03/07/2022   MCV 83.4 03/07/2022   PLT 247 03/07/2022   Lab Results  Component Value Date   ALT 21 03/07/2022   AST 24 03/07/2022   ALKPHOS 95 03/07/2022   BILITOT 0.4 03/07/2022   No results found for: "25OHVITD2", "25OHVITD3", "VD25OH"   Review of Systems  Constitutional:  Negative for appetite change, fatigue, fever and unexpected weight change.  HENT:  Negative for tinnitus and trouble swallowing.   Eyes:  Negative for visual disturbance.  Respiratory:  Positive for shortness of breath. Negative for cough, sputum production, chest tightness and wheezing.   Cardiovascular:  Negative for chest pain, palpitations and leg swelling.  Gastrointestinal:  Negative for abdominal pain.  Endocrine: Negative for polydipsia and polyuria.  Genitourinary:  Negative for dysuria and hematuria.  Musculoskeletal:  Negative for arthralgias.  Neurological:  Negative for tremors, numbness and headaches.  Psychiatric/Behavioral:  Negative for dysphoric mood.     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   Lumbosacral spondylosis without myelopathy 11/22/2018   Vertigo 10/09/2018   Hyperlipidemia associated with type 2  diabetes mellitus ([place]) 04/11/2018   Gastroparesis 04/11/2018   Long term (current) use of aromatase inhibitors 12/25/2017   Arthritis    Elevated LFTs    Essential hypertension    Positive PPD, treated    Umbilical hernia without obstruction and without gangrene 07/13/2016   Irritable bowel syndrome with both constipation and diarrhea 07/20/2015   Type II diabetes mellitus with complication ([place]) 95/28/4132   Pre-ulcerative calluses 06/22/2015   Neuropathy 06/22/2015   Hot flash, menopausal 04/09/2015   Atherosclerotic peripheral vascular disease with intermittent claudication ([place]) 03/25/2015   Osteoporosis of lumbar spine 03/12/2015   Malignant neoplasm of left breast in female, estrogen receptor positive ([place]) 05/30/2014   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    No Known Allergies  Past Surgical History:  Procedure Laterality Date   BIOPSY  11/11/2021   Procedure: BIOPSY;  Surgeon: Irving Copas., MD;  Location: [place];  Service: Gastroenterology;;   BREAST BIOPSY Left 07/31/2014   [place] and DCIS    BREAST BIOPSY Left 03/08/2016   INTRADUCTAL PAPILLOMA    BREAST LUMPECTOMY Left 07/2014   [facility], DCIS, clear margins, positive LN   BREAST LUMPECTOMY Left 04/05/2016   excision of intraductal papilloma, no malignancy or atypia   BREAST LUMPECTOMY WITH NEEDLE LOCALIZATION Left 04/05/2016   Procedure: BREAST LUMPECTOMY WITH NEEDLE LOCALIZATION;  Surgeon: Hubbard Robinson, MD;  Location: [facility];  Service: General;  Laterality: Left;   BREAST LUMPECTOMY WITH NEEDLE LOCALIZATION AND AXILLARY SENTINEL LYMPH NODE BX Left 08/27/14   CHOLECYSTECTOMY  09/07/2013   COLONOSCOPY WITH PROPOFOL N/A 08/28/2015   Procedure: COLONOSCOPY WITH PROPOFOL;  Surgeon: Lucilla Lame, MD;  Location: [place];  Service: Endoscopy;  Laterality: N/A;  Diabetic oral   COLONOSCOPY WITH PROPOFOL N/A 08/14/2020   Procedure: COLONOSCOPY WITH PROPOFOL;  Surgeon: Virgel Manifold, MD;   Location: [facility];  Service: Endoscopy;  Laterality: N/A;   ENDOSCOPIC MUCOSAL RESECTION N/A 09/10/2020   Procedure: ENDOSCOPIC MUCOSAL RESECTION;  Surgeon: Rush Landmark Telford Nab., MD;  Location: [place];  Service: Gastroenterology;  Laterality: N/A;   ESOPHAGOGASTRODUODENOSCOPY  2014   gastritis   ESOPHAGOGASTRODUODENOSCOPY (EGD) WITH PROPOFOL N/A 04/16/2019   Procedure: ESOPHAGOGASTRODUODENOSCOPY (EGD) WITH PROPOFOL;  Surgeon: Virgel Manifold, MD;  Location: [facility];  Service: Endoscopy;  Laterality: N/A;   ESOPHAGOGASTRODUODENOSCOPY (EGD) WITH PROPOFOL N/A 10/03/2019   Procedure: ESOPHAGOGASTRODUODENOSCOPY (EGD) WITH PROPOFOL;  Surgeon: Virgel Manifold, MD;  Location: [facility];  Service: Endoscopy;  Laterality: N/A;   EUS N/A 09/10/2020   Procedure: LOWER ENDOSCOPIC ULTRASOUND (EUS);  Surgeon: Irving Copas., MD;  Location: [place];  Service: Gastroenterology;  Laterality: N/A;   EUS N/A 11/11/2021   Procedure: LOWER ENDOSCOPIC ULTRASOUND (EUS);  Surgeon: Irving Copas., MD;  Location: [place];  Service: Gastroenterology;  Laterality: N/A;   FLEXIBLE SIGMOIDOSCOPY N/A 08/27/2020   Procedure: FLEXIBLE SIGMOIDOSCOPY;  Surgeon: Virgel Manifold, MD;  Location: [facility];  Service: Endoscopy;  Laterality: N/A;   FLEXIBLE SIGMOIDOSCOPY N/A 09/10/2020   Procedure: FLEXIBLE SIGMOIDOSCOPY;  Surgeon: Rush Landmark Telford Nab., MD;  Location: [place];  Service: Gastroenterology;  Laterality: N/A;   FLEXIBLE SIGMOIDOSCOPY N/A 11/11/2021  Procedure: FLEXIBLE SIGMOIDOSCOPY;  Surgeon: Irving Copas., MD;  Location: [place];  Service: Gastroenterology;  Laterality: N/A;   FOREIGN BODY REMOVAL  09/10/2020   Procedure: FOREIGN BODY REMOVAL ;  Surgeon: Rush Landmark Telford Nab., MD;  Location: [place];  Service: Gastroenterology;;   HEMOSTASIS CLIP PLACEMENT  09/10/2020   Procedure: HEMOSTASIS CLIP PLACEMENT;  Surgeon:  Irving Copas., MD;  Location: [place];  Service: Gastroenterology;;   HYSTEROSCOPY WITH D & C N/A 03/25/2021   Procedure: DILATATION AND CURETTAGE [place];  Surgeon: Malachy Mood, MD;  Location: [facility];  Service: Gynecology;  Laterality: N/A;   POLYPECTOMY  08/28/2015   Procedure: POLYPECTOMY INTESTINAL;  Surgeon: Lucilla Lame, MD;  Location: [place];  Service: Endoscopy;;  Rectal polyp   SUBMUCOSAL LIFTING INJECTION  09/10/2020   Procedure: SUBMUCOSAL LIFTING INJECTION;  Surgeon: Irving Copas., MD;  Location: [place];  Service: Gastroenterology;;   TUBAL LIGATION Bilateral    UMBILICAL HERNIA REPAIR N/A 08/09/2016   Procedure: HERNIA REPAIR UMBILICAL ADULT;  Surgeon: Olean Ree, MD;  Location: [facility];  Service: General;  Laterality: N/A;   VENTRAL HERNIA REPAIR N/A 04/10/2015   Procedure: HERNIA REPAIR VENTRAL ADULT;  Surgeon: Leonie Green, MD;  Location: [facility];  Service: General;  Laterality: N/A;    Social History   Tobacco Use   Smoking status: Every Day    Packs/day: 0.50    Years: 45.00    Total pack years: 22.50    Types: Cigarettes   Smokeless tobacco: Never   Tobacco comments:    Smokes 7 ciggs daily.  Vaping Use   Vaping Use: Never used  Substance Use Topics   Alcohol use: Yes    Alcohol/week: 2.0 standard drinks of alcohol    Types: 2 Cans of beer per week    Comment: weekends - beer    Drug use: No     Medication list has been reviewed and updated.  Current Meds  Medication Sig   acetaminophen (TYLENOL) 500 MG tablet Take 1,000 mg by mouth every 6 (six) hours as needed for moderate pain.   amLODipine (NORVASC) 10 MG tablet Take 2 tablets (20 mg total) by mouth daily. (Patient taking differently: Take 10 mg by mouth daily.)   aspirin EC 81 MG tablet Take 81 mg by mouth daily.   atorvastatin (LIPITOR) 10 MG tablet Take 1 tablet (10 mg total) by mouth at bedtime.   Blood Glucose Calibration (TRUE  METRIX LEVEL 1) Low SOLN 1 each by In Vitro route daily as needed.   Blood Glucose Monitoring Suppl (TRUE METRIX METER) DEVI 1 each by Does not apply route daily.   glucose blood test strip Test BID   irbesartan (AVAPRO) 300 MG tablet Take 1 tablet (300 mg total) by mouth daily.   metFORMIN (GLUCOPHAGE-XR) 500 MG 24 hr tablet TAKE ONE TABLET BY MOUTH EVERY MORNING WITH BREAKFAST   methocarbamol (ROBAXIN) 500 MG tablet Take 500 mg by mouth at bedtime.   Multiple Vitamins-Minerals (MULTIVITAMIN WITH MINERALS) tablet Take 1 tablet by mouth daily.   triamterene-hydrochlorothiazide (MAXZIDE-25) 37.5-25 MG tablet Take 1 tablet by mouth daily.       03/17/2022    9:27 AM 11/03/2021    8:26 AM 07/06/2021    9:01 AM 07/06/2021    8:58 AM  GAD 7 : Generalized Anxiety Score  Nervous, Anxious, on Edge 0 0 0 0  Control/stop worrying 1 0 0 0  Worry too much - different things 1  0 1 1  Trouble relaxing 0 0 0 0  Restless 1 1 0 0  Easily annoyed or irritable 1 0 0 0  Afraid - awful might happen 0 0 0 0  Total GAD 7 Score '4 1 1 1  '$ Anxiety Difficulty Not difficult at all Not difficult at all Not difficult at all Not difficult at all       03/17/2022    9:27 AM 11/03/2021    8:26 AM 07/06/2021    9:01 AM  Depression screen PHQ 2/9  Decreased Interest 1 1 0  Down, Depressed, Hopeless 1 3 0  PHQ - 2 Score 2 4 0  Altered sleeping '1 2 1  '$ Tired, decreased energy 0 2 0  Change in appetite 1 1 0  Feeling bad or failure about yourself  0 0 0  Trouble concentrating 0 0 0  Moving slowly or fidgety/restless 0 1 0  Suicidal thoughts 0 0 0  PHQ-9 Score '4 10 1  '$ Difficult doing work/chores Not difficult at all Not difficult at all Not difficult at all    BP Readings from Last 3 Encounters:  03/17/22 128/60  03/07/22 139/64  11/11/21 125/64    Physical Exam Vitals and nursing note reviewed.  Constitutional:      General: She is not in acute distress.    Appearance: She is well-developed.  HENT:      Head: Normocephalic and atraumatic.  Cardiovascular:     Rate and Rhythm: Normal rate and regular rhythm.     Pulses: Normal pulses.     Heart sounds: No murmur heard. Pulmonary:     Effort: Pulmonary effort is normal. No respiratory distress.     Breath sounds: No decreased breath sounds, wheezing or rhonchi.  Musculoskeletal:     Right lower leg: No edema.     Left lower leg: No edema.  Skin:    General: Skin is warm and dry.     Findings: No lesion or rash.  Neurological:     Mental Status: She is alert and oriented to person, place, and time.  Psychiatric:        Mood and Affect: Mood normal.        Behavior: Behavior normal.     Wt Readings from Last 3 Encounters:  03/17/22 214 lb (97.1 kg)  03/07/22 211 lb 8 oz (95.9 kg)  02/16/22 208 lb (94.3 kg)    BP 128/60   Pulse 78   Ht '5\' 8"'$  (1.727 m)   Wt 214 lb (97.1 kg)   SpO2 96%   BMI 32.54 kg/m   Assessment and Plan: 1. Type II diabetes mellitus with complication (HCC) Clinically stable by exam and report without s/s of hypoglycemia. DM complicated by hypertension and dyslipidemia. Reports consistent abdominal discomfort followed by several loose stools every am after metformin dose. Will stop metformin and start Jardiance 25 mg - sample given. - POCT glycosylated hemoglobin (Hb A1C)= 7.0 up from 6.5  2. Essential hypertension Clinically stable exam with well controlled BP. Tolerating medications without side effects at this time. Pt to continue current regimen and low sodium diet; benefits of regular exercise as able discussed.  3. Need for immunization against influenza - Flu Vaccine QUAD 53moIM (Fluarix, Fluzone & Alfiuria Quad PF)  4. Centrilobular emphysema (HCC) Mild intermittent symptoms of shortness of breath without cough or wheeze with exertion Cardiology evaluation recently felt this was pulmonary and not cardiac Will given albuterol MDI to use PRN - albuterol (  VENTOLIN HFA) 108 (90 Base) MCG/ACT  inhaler; Inhale 2 puffs into the lungs every 6 (six) hours as needed for wheezing or shortness of breath.  Dispense: 1 each; Refill: 0  5. Lung nodule Close follow up in 6 mo scheduled.   Partially dictated using Editor, commissioning. Any errors are unintentional.  Halina Maidens, MD [facility]  03/17/2022

## 2022-03-17 NOTE — Patient Instructions (Signed)
Stop Metformin  Start Januvia 25 mg once a day.  Call for a prescription if doing well after 3 weeks.

## 2022-03-21 ENCOUNTER — Other Ambulatory Visit: Payer: Self-pay

## 2022-03-21 ENCOUNTER — Telehealth: Payer: Self-pay | Admitting: Internal Medicine

## 2022-03-21 DIAGNOSIS — N898 Other specified noninflammatory disorders of vagina: Secondary | ICD-10-CM

## 2022-03-21 MED ORDER — FLUCONAZOLE 100 MG PO TABS: 100.0000 mg | ORAL_TABLET | Freq: Once | ORAL | 0 refills | Status: AC

## 2022-03-21 NOTE — Telephone Encounter (Signed)
Can you schedule pt an appt please? Pt needs appt before any medication can be sent in.  KP

## 2022-03-21 NOTE — Telephone Encounter (Signed)
Pt is calling to ask Dr. Army Melia if she can prescribe medication for yeast. Please advise   [place] [place] [facility]  [address] [place] [address]  Phone: [phone] Fax: [phone]  Hours: Not open 24 hours

## 2022-04-08 ENCOUNTER — Other Ambulatory Visit: Payer: Self-pay | Admitting: Internal Medicine

## 2022-04-08 DIAGNOSIS — J432 Centrilobular emphysema: Secondary | ICD-10-CM

## 2022-04-08 NOTE — Telephone Encounter (Signed)
Requested medication (s) are due for refill today - provider review   Requested medication (s) are on the active medication list -yes  Future visit scheduled -yes  Last refill: 03/17/22 1  Notes to clinic: original Rx has no RF- sent for review to continue  Requested Prescriptions  Pending Prescriptions Disp Refills   albuterol (VENTOLIN HFA) 108 (90 Base) MCG/ACT inhaler [Pharmacy Med Name: ALBUTEROL HFA INH (200 PUFFS) 6.7GM] 6.7 g     Sig: INHALE 2 PUFFS INTO THE LUNGS EVERY 6 HOURS AS NEEDED FOR WHEEZING OR SHORTNESS OF BREATH     Pulmonology:  Beta Agonists 2 Passed - 04/08/2022  3:40 AM      Passed - Last BP in normal range    BP Readings from Last 1 Encounters:  03/17/22 128/60         Passed - Last Heart Rate in normal range    Pulse Readings from Last 1 Encounters:  03/17/22 78         Passed - Valid encounter within last 12 months    Recent Outpatient Visits           3 weeks ago Type II diabetes mellitus with complication ([place])   [place] [facility] at [facility], Jesse Sans, MD   5 months ago Encounter for Medicare annual wellness exam   [facility] at [facility], Jesse Sans, MD   5 months ago Essential hypertension   [place] [facility] at [facility], Jesse Sans, MD   9 months ago Essential hypertension   [place] [facility] at [facility], Jesse Sans, MD   1 year ago Type II diabetes mellitus with complication [facility])   [place] and Sports Medicine at [facility], Jesse Sans, MD       Future Appointments             In 3 months Army Melia Jesse Sans, MD [facility] Primary Care and Sports Medicine at [facility], [facility]               Requested Prescriptions  Pending Prescriptions Disp Refills   albuterol (VENTOLIN HFA) 108 (90 Base) MCG/ACT inhaler [Pharmacy Med Name:  ALBUTEROL HFA INH (200 PUFFS) 6.7GM] 6.7 g     Sig: INHALE 2 PUFFS INTO THE LUNGS EVERY 6 HOURS AS NEEDED FOR WHEEZING OR SHORTNESS OF BREATH     Pulmonology:  Beta Agonists 2 Passed - 04/08/2022  3:40 AM      Passed - Last BP in normal range    BP Readings from Last 1 Encounters:  03/17/22 128/60         Passed - Last Heart Rate in normal range    Pulse Readings from Last 1 Encounters:  03/17/22 78         Passed - Valid encounter within last 12 months    Recent Outpatient Visits           3 weeks ago Type II diabetes mellitus with complication [facility])   [place] [facility] at [facility], Jesse Sans, MD   5 months ago Encounter for Medicare annual wellness exam   [facility] and [facility] Medicine at [facility], Jesse Sans, MD   5 months ago Essential hypertension   [facility] and [facility] at [facility], Mickel Baas  H, MD   9 months ago Essential hypertension   [place] [facility] and [facility] Medicine at [facility], Jesse Sans, MD   1 year ago Type II diabetes mellitus with complication [facility])   [place] [facility] and Sports Medicine at [facility], Jesse Sans, MD       Future Appointments             In 3 months Army Melia, Jesse Sans, MD [facility] Primary Care and Sports Medicine at [facility], [facility]

## 2022-04-19 ENCOUNTER — Other Ambulatory Visit: Payer: Self-pay | Admitting: Internal Medicine

## 2022-04-19 DIAGNOSIS — I10 Essential (primary) hypertension: Secondary | ICD-10-CM

## 2022-04-19 DIAGNOSIS — E118 Type 2 diabetes mellitus with unspecified complications: Secondary | ICD-10-CM

## 2022-04-19 DIAGNOSIS — E1169 Type 2 diabetes mellitus with other specified complication: Secondary | ICD-10-CM

## 2022-04-19 MED ORDER — AMLODIPINE BESYLATE 10 MG PO TABS: 10.0000 mg | ORAL_TABLET | Freq: Every day | ORAL | 1 refills | Status: DC

## 2022-04-19 MED ORDER — METFORMIN HCL ER 500 MG PO TB24: ORAL_TABLET | ORAL | 1 refills | Status: DC

## 2022-04-19 MED ORDER — IRBESARTAN 300 MG PO TABS: 300.0000 mg | ORAL_TABLET | Freq: Every day | ORAL | 1 refills | Status: DC

## 2022-04-19 MED ORDER — ATORVASTATIN CALCIUM 10 MG PO TABS: 10.0000 mg | ORAL_TABLET | Freq: Every day | ORAL | 1 refills | Status: DC

## 2022-04-19 MED ORDER — TRIAMTERENE-HCTZ 37.5-25 MG PO TABS: 1.0000 | ORAL_TABLET | Freq: Every day | ORAL | 1 refills | Status: DC

## 2022-04-19 NOTE — Telephone Encounter (Signed)
Requested Prescriptions  Pending Prescriptions Disp Refills   amLODipine (NORVASC) 10 MG tablet 90 tablet 1    Sig: Take 1 tablet (10 mg total) by mouth daily.     Cardiovascular: Calcium Channel Blockers 2 Passed - 04/19/2022 11:05 AM      Passed - Last BP in normal range    BP Readings from Last 1 Encounters:  03/17/22 128/60         Passed - Last Heart Rate in normal range    Pulse Readings from Last 1 Encounters:  03/17/22 78         Passed - Valid encounter within last 6 months    Recent Outpatient Visits           1 month ago Type II diabetes mellitus with complication ([place])   [place] [facility] at [facility], Jesse Sans, MD   5 months ago Encounter for Medicare annual wellness exam   [facility] at [facility], Jesse Sans, MD   5 months ago Essential hypertension   [place] [facility] Medicine at [facility], Jesse Sans, MD   9 months ago Essential hypertension   [place] [facility] at [facility], Jesse Sans, MD   1 year ago Type II diabetes mellitus with complication [facility])   [place] [facility] and Sports Medicine at [facility], Jesse Sans, MD       Future Appointments             In 3 months Glean Hess, MD [facility] Primary Care and Sports Medicine at [place], [facility]             irbesartan (AVAPRO) 300 MG tablet 90 tablet 1    Sig: Take 1 tablet (300 mg total) by mouth daily.     Cardiovascular:  Angiotensin Receptor Blockers Passed - 04/19/2022 11:05 AM      Passed - Cr in normal range and within 180 days    Creatinine  Date Value Ref Range Status  09/09/2014 0.67 mg/dL Final    Comment:    0.44-1.00 NOTE: New Reference Range  08/05/14    Creatinine, Ser  Date Value Ref Range Status  03/07/2022 0.73 0.44 - 1.00 mg/dL Final         Passed - K in normal range and within  180 days    Potassium  Date Value Ref Range Status  03/07/2022 3.6 3.5 - 5.1 mmol/L Final  09/09/2014 4.3 mmol/L Final    Comment:    3.5-5.1 NOTE: New Reference Range  08/05/14          Passed - Patient is not pregnant      Passed - Last BP in normal range    BP Readings from Last 1 Encounters:  03/17/22 128/60         Passed - Valid encounter within last 6 months    Recent Outpatient Visits           1 month ago Type II diabetes mellitus with complication [facility])   [place] [facility] at [facility], Jesse Sans, MD   5 months ago Encounter for Medicare annual wellness exam   [facility] at [facility], Jesse Sans, MD   5 months ago Essential hypertension   [place] [facility] at  [facility], [facility]   9 months ago Essential hypertension   [place] [facility], Jesse Sans, MD   1 year ago Type II diabetes mellitus with complication [facility])   [place] Primary Care and Sports Medicine at [facility], Jesse Sans, MD       Future Appointments             In 3 months Glean Hess, MD [facility] Primary [facility] at [facility], [facility]             atorvastatin (LIPITOR) 10 MG tablet 90 tablet 1    Sig: Take 1 tablet (10 mg total) by mouth at bedtime.     Cardiovascular:  Antilipid - Statins Failed - 04/19/2022 11:05 AM      Failed - Lipid Panel in normal range within the last 12 months    Cholesterol  Date Value Ref Range Status  07/06/2021 163 0 - 200 mg/dL Final   LDL Cholesterol  Date Value Ref Range Status  07/06/2021 64 0 - 99 mg/dL Final    Comment:           Total Cholesterol/HDL:CHD Risk Coronary Heart Disease Risk Table                     Men   Women  1/2 Average Risk   3.4   3.3  Average Risk       5.0   4.4  2 X Average Risk   9.6   7.1  3 X  Average Risk  23.4   11.0        Use the calculated Patient Ratio above and the CHD Risk Table to determine the patient's CHD Risk.        ATP III CLASSIFICATION (LDL):  <100     mg/dL   Optimal  100-129  mg/dL   Near or Above                    Optimal  130-159  mg/dL   Borderline  160-189  mg/dL   High  >190     mg/dL   Very High Performed at [facility], [place], [place] [address]    HDL  Date Value Ref Range Status  07/06/2021 33 (L) >40 mg/dL Final   Triglycerides  Date Value Ref Range Status  07/06/2021 331 (H) <150 mg/dL Final         Passed - Patient is not pregnant      Passed - Valid encounter within last 12 months    Recent Outpatient Visits           1 month ago Type II diabetes mellitus with complication [facility])   [place] [facility] and Sports Medicine at [facility], Jesse Sans, MD   5 months ago Encounter for Medicare annual wellness exam   [place] Primary Care and Sports Medicine at [facility], Jesse Sans, MD   5 months ago Essential hypertension   [place] Primary Care and Sports Medicine at [facility], Jesse Sans, MD   9 months ago Essential hypertension   [place] Primary Care and Sports Medicine at [facility], Jesse Sans, MD   1 year ago Type II diabetes mellitus with complication [facility])   [facility] Primary Care and Sports Medicine at [facility], Jesse Sans,  MD       Future Appointments             In 3 months Army Melia, Jesse Sans, MD [facility] Primary Care and Sports Medicine at [facility], [facility]             metFORMIN (GLUCOPHAGE-XR) 500 MG 24 hr tablet 90 tablet 1    Sig: TAKE ONE TABLET BY MOUTH EVERY MORNING WITH BREAKFAST     Endocrinology:  Diabetes - Biguanides Failed - 04/19/2022 11:05 AM      Failed - B12 Level in normal range and within 720 days    No results found for: "VITAMINB12"       Passed - Cr in normal range and  within 360 days    Creatinine  Date Value Ref Range Status  09/09/2014 0.67 mg/dL Final    Comment:    0.44-1.00 NOTE: New Reference Range  08/05/14    Creatinine, Ser  Date Value Ref Range Status  03/07/2022 0.73 0.44 - 1.00 mg/dL Final         Passed - HBA1C is between 0 and 7.9 and within 180 days    Hemoglobin A1C  Date Value Ref Range Status  03/17/2022 7.0 (A) 4.0 - 5.6 % Final   Hgb A1c MFr Bld  Date Value Ref Range Status  07/06/2021 6.1 (H) 4.8 - 5.6 % Final    Comment:    (NOTE) Pre diabetes:          5.7%-6.4%  Diabetes:              >6.4%  Glycemic control for   <7.0% adults with diabetes          Passed - eGFR in normal range and within 360 days    EGFR (African American)  Date Value Ref Range Status  09/09/2014 >60  Final   GFR calc Af Amer  Date Value Ref Range Status  02/11/2020 >60 >60 mL/min Final   EGFR (Non-African Amer.)  Date Value Ref Range Status  09/09/2014 >60  Final    Comment:    eGFR values <45m/min/1.73 m2 may be an indication of chronic kidney disease (CKD). Calculated eGFR is useful in patients with stable renal function. The eGFR calculation will not be reliable in acutely ill patients when serum creatinine is changing rapidly. It is not useful in patients on dialysis. The eGFR calculation may not be applicable to patients at the low and high extremes of body sizes, pregnant women, and vegetarians.    GFR, Estimated  Date Value Ref Range Status  03/07/2022 >60 >60 mL/min Final    Comment:    (NOTE) Calculated using the CKD-EPI Creatinine Equation (2021)          Passed - Valid encounter within last 6 months    Recent Outpatient Visits           1 month ago Type II diabetes mellitus with complication ([facility]   [place] [facility]   5 months ago Encounter for Medicare annual wellness exam   [facility]   5 months ago Essential hypertension   [place] [facility] at [facility]   9 months ago Essential hypertension   [place] [facility]   1 year  ago Type II diabetes mellitus with complication [facility])   [place] Primary Care and Sports Medicine at [facility], Jesse Sans, MD       Future Appointments             In 3 months Army Melia Jesse Sans, MD [facility] Primary Care and Sports Medicine at [facility] [place] within normal limits and completed in the last 12 months    WBC  Date Value Ref Range Status  03/07/2022 8.3 4.0 - 10.5 K/uL Final   RBC  Date Value Ref Range Status  03/07/2022 5.18 (H) 3.87 - 5.11 MIL/uL Final   Hemoglobin  Date Value Ref Range Status  03/07/2022 14.2 12.0 - 15.0 g/dL Final  05/26/2017 13.6 11.1 - 15.9 g/dL Final   HCT  Date Value Ref Range Status  03/07/2022 43.2 36.0 - 46.0 % Final   Hematocrit  Date Value Ref Range Status  05/26/2017 40.7 34.0 - 46.6 % Final   MCHC  Date Value Ref Range Status  03/07/2022 32.9 30.0 - 36.0 g/dL Final   [facility]  Date Value Ref Range Status  03/07/2022 27.4 26.0 - 34.0 pg Final   MCV  Date Value Ref Range Status  03/07/2022 83.4 80.0 - 100.0 fL Final  05/26/2017 80 79 - 97 fL Final  09/09/2014 83 80 - 100 fL Final   No results found for: "PLTCOUNTKUC", "LABPLAT", "POCPLA" RDW  Date Value Ref Range Status  03/07/2022 14.2 11.5 - 15.5 % Final  05/26/2017 14.6 12.3 - 15.4 % Final  09/09/2014 13.7 11.5 - 14.5 % Final          triamterene-hydrochlorothiazide (MAXZIDE-25) 37.5-25 MG tablet 90 tablet 1    Sig: Take 1 tablet by mouth daily.     Cardiovascular: Diuretic Combos Passed - 04/19/2022 11:05 AM      Passed - K in normal range and within 180 days    Potassium  Date Value Ref Range Status  03/07/2022 3.6  3.5 - 5.1 mmol/L Final  09/09/2014 4.3 mmol/L Final    Comment:    3.5-5.1 NOTE: New Reference Range  08/05/14          Passed - Na in normal range and within 180 days    Sodium  Date Value Ref Range Status  03/07/2022 136 135 - 145 mmol/L Final  05/26/2017 145 (H) 134 - 144 mmol/L Final  09/09/2014 137 mmol/L Final    Comment:    135-145 NOTE: New Reference Range  08/05/14          Passed - Cr in normal range and within 180 days    Creatinine  Date Value Ref Range Status  09/09/2014 0.67 mg/dL Final    Comment:    0.44-1.00 NOTE: New Reference Range  08/05/14    Creatinine, Ser  Date Value Ref Range Status  03/07/2022 0.73 0.44 - 1.00 mg/dL Final         Passed - Last BP in normal range    BP Readings from Last 1 Encounters:  03/17/22 128/60         Passed - Valid encounter within last 6 months    Recent Outpatient Visits           1 month ago Type II diabetes mellitus with complication [facility])   [place] [facility] Care and Sports Medicine at [facility], Jesse Sans, MD   5  months ago Encounter for [employer] annual wellness exam   [place] [facility] Care and Sports Medicine at [facility], Jesse Sans, MD   5 months ago Essential hypertension   [place] [facility] Care and Sports Medicine at [facility], Jesse Sans, MD   9 months ago Essential hypertension   [place] Primary Care and Sports Medicine at [facility], Jesse Sans, MD   1 year ago Type II diabetes mellitus with complication [facility])   [place] Primary Care and Sports Medicine at [facility], Jesse Sans, MD       Future Appointments             In 3 months Army Melia, Jesse Sans, MD [facility] Primary Care and Sports Medicine at [facility], [facility]

## 2022-04-19 NOTE — Telephone Encounter (Signed)
Medication Refill - Medication: irbesartan (AVAPRO) 300 MG tablet  amLODipine (NORVASC) 10 MG tablet atorvastatin (LIPITOR) 10 MG tablet   metFORMIN (GLUCOPHAGE-XR) 500 MG 24 hr tablet  triamterene-hydrochlorothiazide (MAXZIDE-25) 37.5-25 MG tablet   Has the patient contacted their pharmacy? No. Pt declined Preferred Pharmacy (with phone number or street name): [id number] [place], [place] [facility]  Has the patient been seen for an appointment in the last year OR does the patient have an upcoming appointment? Yes.    Agent: Please be advised that RX refills may take up to 3 business days. We ask that you follow-up with your pharmacy.

## 2022-06-15 ENCOUNTER — Inpatient Hospital Stay: Payer: 59 | Attending: Oncology | Admitting: Licensed Clinical Social Worker

## 2022-06-15 DIAGNOSIS — Z853 Personal history of malignant neoplasm of breast: Secondary | ICD-10-CM | POA: Insufficient documentation

## 2022-06-15 DIAGNOSIS — M8580 Other specified disorders of bone density and structure, unspecified site: Secondary | ICD-10-CM | POA: Insufficient documentation

## 2022-06-15 DIAGNOSIS — R918 Other nonspecific abnormal finding of lung field: Secondary | ICD-10-CM | POA: Insufficient documentation

## 2022-06-15 DIAGNOSIS — C7A026 Malignant carcinoid tumor of the rectum: Secondary | ICD-10-CM | POA: Insufficient documentation

## 2022-06-15 DIAGNOSIS — Z79899 Other long term (current) drug therapy: Secondary | ICD-10-CM | POA: Insufficient documentation

## 2022-06-15 DIAGNOSIS — Z87891 Personal history of nicotine dependence: Secondary | ICD-10-CM | POA: Insufficient documentation

## 2022-06-15 DIAGNOSIS — Z7982 Long term (current) use of aspirin: Secondary | ICD-10-CM | POA: Insufficient documentation

## 2022-06-15 NOTE — Progress Notes (Signed)
[place] Work  Clinical Social Work was referred by  Laverta Baltimore, Scheduler,  for assessment of psychosocial needs.  Clinical Social Worker contacted patient by phone  to offer support and assess for needs.    Patient requested funds to assist with her car repair.  CSW provided education regarding accessing funds only if receiving treatment.  Patient stated she understood.  CSW did make referrals for patient through [facility] with her permission.  Informed her that Adelene Amas, LCSW, is the primary Education officer, museum for [employer].     Margaree Mackintosh, LCSW  Clinical Social Worker [facility]

## 2022-06-27 ENCOUNTER — Inpatient Hospital Stay: Payer: 59 | Admitting: Licensed Clinical Social Worker

## 2022-06-27 DIAGNOSIS — C50012 Malignant neoplasm of nipple and areola, left female breast: Secondary | ICD-10-CM

## 2022-06-27 NOTE — Progress Notes (Signed)
[place] [facility]  Initial Assessment   Donna Riley is a 62 y.o. year old female contacted by phone. Clinical Social Work was referred by Education officer, museum for assessment of psychosocial needs.   SDOH (Social Determinants of Health) assessments performed: Yes SDOH Interventions    Flowsheet Row Clinical Support from 06/27/2022 in [place] at [facility] Most recent reading at 06/27/2022  2:40 PM Office Visit from 11/03/2021 in [place] at [facility] Most recent reading at 11/03/2021  8:26 AM Office Visit from 11/03/2021 in [place] at [facility] Most recent reading at 11/03/2021  8:21 AM Office Visit from 07/06/2021 in [place] at [facility] Most recent reading at 07/06/2021  9:01 AM Clinical Support from 07/10/2019 in [place] at [facility] Most recent reading at 07/10/2019 10:13 AM Office Visit from 10/09/2018 in [place] at [facility] Most recent reading at 10/09/2018  9:07 AM  SDOH Interventions        Food Insecurity Interventions Intervention Not Indicated -- -- -- -- --  Housing Interventions Intervention Not Indicated -- -- -- -- --  Utilities Interventions Intervention Not Indicated -- -- -- -- --  Alcohol Usage Interventions Intervention Not Indicated (Score <7) -- -- -- -- --  Depression Interventions/Treatment  -- Counseling -- PHQ2-9 Score <4 Follow-up Not Indicated PHQ2-9 Score <4 Follow-up Not Indicated Counseling  Financial Strain Interventions Intervention Not Indicated -- -- -- -- --  Physical Activity Interventions Intervention Not Indicated -- -- -- -- --  Stress Interventions Intervention Not Indicated -- Intervention Not Indicated -- -- --  Social Connections Interventions Intervention Not Indicated -- Intervention Not Indicated -- -- --       SDOH  Screenings   Food Insecurity: No Food Insecurity (06/27/2022)  Housing: Low Risk  (06/27/2022)  Transportation Needs: No Transportation Needs (03/17/2022)  Utilities: Not At Risk (06/27/2022)  Alcohol Screen: Low Risk  (06/27/2022)  Depression (PHQ2-9): Low Risk  (06/27/2022)  Financial Resource Strain: Medium Risk (06/27/2022)  Physical Activity: Inactive (06/27/2022)  Social Connections: Socially Isolated (06/27/2022)  Stress: Stress Concern Present (06/27/2022)  Tobacco Use: High Risk (03/17/2022)     Distress Screen completed: No    04/18/2016    9:51 AM  ONCBCN DISTRESS SCREENING  Screening Type Initial Screening  Distress experienced in past week (1-10) 0      Family/Social Information:  Housing Arrangement: patient lives alone, main contact Louann (Son) [phone] Family members/support persons in your life? Family and Medical Staff Transportation concerns: yes, patient is currently unable to renew her tags due to lack of income  Employment: Disabled  .  Income source: Banker concerns: Yes, current concerns Type of concern: Secretary/administrator access concerns: no Religious or spiritual practice: Not known Services Currently in place:  [employer] Dual Complete Medicare  Coping/ Adjustment to diagnosis: Patient understands treatment plan and what happens next? Patient is scheduled for CT scan in early February 2024 Concerns about diagnosis and/or treatment: Losing my job and/or losing income and How I will pay for the services I need Patient reported stressors: Transport planner and/or priorities: to receive financial assistance Patient enjoys  N/A Current coping skills/ strengths: Average or above average intelligence , Capable of independent living , Communication skills , and Motivation for treatment/growth     SUMMARY: Current  SDOH Barriers:  Financial constraints related to fixed income, Limited social support,  Transportation, and support  Clinical Social Work Clinical Goal(s):  Patient will follow up with DSS and local churches to inquire for financial assistance* as directed by SW  Interventions: Discussed common feeling and emotions when being diagnosed with cancer, and the importance of support during treatment Informed patient of the support team roles and support services at [facility] Provided CSW contact information and encouraged patient to call with any questions or concerns Referred patient to DSS and Provided patient with information about [place] fund assistance and the criteria to receive financial assistance for patient currently in active treatment.   Follow Up Plan: Patient will contact CSW with any support or resource needs Patient verbalizes understanding of plan: Yes    Trapper Meech, LCSW

## 2022-06-28 ENCOUNTER — Other Ambulatory Visit: Payer: Self-pay

## 2022-06-28 DIAGNOSIS — C50012 Malignant neoplasm of nipple and areola, left female breast: Secondary | ICD-10-CM

## 2022-06-29 ENCOUNTER — Inpatient Hospital Stay: Payer: 59

## 2022-06-29 DIAGNOSIS — Z79899 Other long term (current) drug therapy: Secondary | ICD-10-CM | POA: Diagnosis not present

## 2022-06-29 DIAGNOSIS — Z87891 Personal history of nicotine dependence: Secondary | ICD-10-CM | POA: Diagnosis not present

## 2022-06-29 DIAGNOSIS — Z7982 Long term (current) use of aspirin: Secondary | ICD-10-CM | POA: Diagnosis not present

## 2022-06-29 DIAGNOSIS — R918 Other nonspecific abnormal finding of lung field: Secondary | ICD-10-CM | POA: Diagnosis not present

## 2022-06-29 DIAGNOSIS — C50012 Malignant neoplasm of nipple and areola, left female breast: Secondary | ICD-10-CM

## 2022-06-29 DIAGNOSIS — C7A026 Malignant carcinoid tumor of the rectum: Secondary | ICD-10-CM | POA: Diagnosis not present

## 2022-06-29 DIAGNOSIS — M8580 Other specified disorders of bone density and structure, unspecified site: Secondary | ICD-10-CM | POA: Diagnosis not present

## 2022-06-29 DIAGNOSIS — Z853 Personal history of malignant neoplasm of breast: Secondary | ICD-10-CM | POA: Diagnosis not present

## 2022-06-29 LAB — CBC WITH DIFFERENTIAL/PLATELET
Abs Immature Granulocytes: 0.02 10*3/uL (ref 0.00–0.07)
Basophils Absolute: 0.1 10*3/uL (ref 0.0–0.1)
Basophils Relative: 1 %
Eosinophils Absolute: 0.2 10*3/uL (ref 0.0–0.5)
Eosinophils Relative: 3 %
HCT: 44 % (ref 36.0–46.0)
Hemoglobin: 14.1 g/dL (ref 12.0–15.0)
Immature Granulocytes: 0 %
Lymphocytes Relative: 35 %
Lymphs Abs: 2.7 10*3/uL (ref 0.7–4.0)
MCH: 27.5 pg (ref 26.0–34.0)
MCHC: 32 g/dL (ref 30.0–36.0)
MCV: 85.8 fL (ref 80.0–100.0)
Monocytes Absolute: 0.4 10*3/uL (ref 0.1–1.0)
Monocytes Relative: 6 %
Neutro Abs: 4.3 10*3/uL (ref 1.7–7.7)
Neutrophils Relative %: 55 %
Platelets: 241 10*3/uL (ref 150–400)
RBC: 5.13 MIL/uL — ABNORMAL HIGH (ref 3.87–5.11)
RDW: 14.4 % (ref 11.5–15.5)
WBC: 7.8 10*3/uL (ref 4.0–10.5)
nRBC: 0 % (ref 0.0–0.2)

## 2022-06-29 LAB — COMPREHENSIVE METABOLIC PANEL
ALT: 28 U/L (ref 0–44)
AST: 26 U/L (ref 15–41)
Albumin: 4 g/dL (ref 3.5–5.0)
Alkaline Phosphatase: 84 U/L (ref 38–126)
Anion gap: 11 (ref 5–15)
BUN: 9 mg/dL (ref 8–23)
CO2: 24 mmol/L (ref 22–32)
Calcium: 8.9 mg/dL (ref 8.9–10.3)
Chloride: 105 mmol/L (ref 98–111)
Creatinine, Ser: 0.66 mg/dL (ref 0.44–1.00)
GFR, Estimated: >60 mL/min (ref >60)
Glucose, Bld: 133 mg/dL — ABNORMAL HIGH (ref 70–99)
Potassium: 3.7 mmol/L (ref 3.5–5.1)
Sodium: 140 mmol/L (ref 135–145)
Total Bilirubin: 0.3 mg/dL (ref 0.3–1.2)
Total Protein: 7.5 g/dL (ref 6.5–8.1)

## 2022-07-05 DIAGNOSIS — M47817 Spondylosis without myelopathy or radiculopathy, lumbosacral region: Secondary | ICD-10-CM | POA: Diagnosis not present

## 2022-07-08 ENCOUNTER — Ambulatory Visit
Admission: RE | Admit: 2022-07-08 | Discharge: 2022-07-08 | Disposition: A | Discharge status: Home / Self Care | Payer: 59 | Source: Ambulatory Visit | Attending: Oncology | Admitting: Oncology

## 2022-07-08 DIAGNOSIS — Z170 Estrogen receptor positive status [ER+]: Secondary | ICD-10-CM | POA: Diagnosis not present

## 2022-07-08 DIAGNOSIS — C50912 Malignant neoplasm of unspecified site of left female breast: Secondary | ICD-10-CM | POA: Diagnosis not present

## 2022-07-08 DIAGNOSIS — C20 Malignant neoplasm of rectum: Secondary | ICD-10-CM | POA: Diagnosis not present

## 2022-07-08 MED ORDER — IOHEXOL 300 MG/ML  SOLN
100.0000 mL | Freq: Once | INTRAMUSCULAR | Status: AC | PRN
  Administered 2022-07-08: 100 mL via INTRAVENOUS

## 2022-07-14 ENCOUNTER — Ambulatory Visit: Payer: Self-pay

## 2022-07-14 NOTE — Telephone Encounter (Signed)
    Chief Complaint: Lower abdominal pain, right side Symptoms: 2 days Frequency: Above Pertinent Negatives: Patient denies fever, vomiting Disposition: '[]'$ ED /'[]'$ Urgent Care (no appt availability in office) / '[x]'$ Appointment(In office/virtual)/ '[]'$  [facility]/ '[]'$ Home Care/ '[]'$ Refused Recommended Disposition /'[]'$ [place] [facility]/ '[]'$  Follow-up with PCP Additional Notes: Callback for worsening of symptoms.  Reason for Disposition  [1] MODERATE pain (e.g., interferes with normal activities) AND [2] pain comes and goes (cramps) AND [3] present > 24 hours  (Exception: Pain with Vomiting or Diarrhea - see that Guideline.)  Answer Assessment - Initial Assessment Questions 1. LOCATION: "Where does it hurt?"      Right lower 2. RADIATION: "Does the pain shoot anywhere else?" (e.g., chest, back)     No 3. ONSET: "When did the pain begin?" (e.g., minutes, hours or days ago)      2 days 4. SUDDEN: "Gradual or sudden onset?"     Gradual 5. PATTERN "Does the pain come and go, or is it constant?"    - If it comes and goes: "How long does it last?" "Do you have pain now?"     (Note: Comes and goes means the pain is intermittent. It goes away completely between bouts.)    - If constant: "Is it getting better, staying the same, or getting worse?"      (Note: Constant means the pain never goes away completely; most serious pain is constant and gets worse.)      Comes and goes 6. SEVERITY: "How bad is the pain?"  (e.g., Scale 1-10; mild, moderate, or severe)    - MILD (1-3): Doesn't interfere with normal activities, abdomen soft and not tender to touch.     - MODERATE (4-7): Interferes with normal activities or awakens from sleep, abdomen tender to touch.     - SEVERE (8-10): Excruciating pain, doubled over, unable to do any normal activities.       Now - 7 7. RECURRENT SYMPTOM: "Have you ever had this type of stomach pain before?" If Yes, ask: "When was the last time?" and "What happened  that time?"      No 8. CAUSE: "What do you think is causing the stomach pain?"     Unsure 9. RELIEVING/AGGRAVATING FACTORS: "What makes it better or worse?" (e.g., antacids, bending or twisting motion, bowel movement)     Laying down helps 10. OTHER SYMPTOMS: "Do you have any other symptoms?" (e.g., back pain, diarrhea, fever, urination pain, vomiting)       No 11. PREGNANCY: "Is there any chance you are pregnant?" "When was your last menstrual period?"       No  Protocols used: Abdominal Pain - [facility]

## 2022-07-15 ENCOUNTER — Encounter: Payer: Self-pay | Admitting: Internal Medicine

## 2022-07-15 ENCOUNTER — Ambulatory Visit (INDEPENDENT_AMBULATORY_CARE_PROVIDER_SITE_OTHER): Payer: 59 | Admitting: Internal Medicine

## 2022-07-15 VITALS — BP 128/70 | HR 86 | Ht 68.0 in | Wt 208.0 lb

## 2022-07-15 DIAGNOSIS — R102 Pelvic and perineal pain: Secondary | ICD-10-CM | POA: Diagnosis not present

## 2022-07-15 DIAGNOSIS — N3001 Acute cystitis with hematuria: Secondary | ICD-10-CM

## 2022-07-15 LAB — POCT URINALYSIS DIPSTICK
Bilirubin, UA: NEGATIVE
Glucose, UA: NEGATIVE
Ketones, UA: NEGATIVE
Nitrite, UA: NEGATIVE
Protein, UA: NEGATIVE
Spec Grav, UA: 1.015 (ref 1.010–1.025)
Urobilinogen, UA: 0.2 E.U./dL
pH, UA: 5 (ref 5.0–8.0)

## 2022-07-15 MED ORDER — SULFAMETHOXAZOLE-TRIMETHOPRIM 800-160 MG PO TABS: 1.0000 | ORAL_TABLET | Freq: Two times a day (BID) | ORAL | 0 refills | Status: DC

## 2022-07-15 NOTE — Progress Notes (Signed)
Date:  07/15/2022   Name:  MIRKA UPRIGHT   DOB:  Jul 17, 1960   MRN:  [id number]   Chief Complaint: Urinary Tract Infection  Urinary Tract Infection  This is a new problem. The current episode started in the past 7 days. The problem has been gradually worsening. The quality of the pain is described as aching. The pain is mild. There has been no fever. Associated symptoms include urgency. Pertinent negatives include no chills. She has tried nothing for the symptoms.    Lab Results  Component Value Date   NA 140 06/29/2022   K 3.7 06/29/2022   CO2 24 06/29/2022   GLUCOSE 133 (H) 06/29/2022   BUN 9 06/29/2022   CREATININE 0.66 06/29/2022   CALCIUM 8.9 06/29/2022   GFRNONAA >60 06/29/2022   Lab Results  Component Value Date   CHOL 163 07/06/2021   HDL 33 (L) 07/06/2021   LDLCALC 64 07/06/2021   TRIG 331 (H) 07/06/2021   CHOLHDL 4.9 07/06/2021   Lab Results  Component Value Date   TSH 1.765 04/11/2018   Lab Results  Component Value Date   HGBA1C 7.0 (A) 03/17/2022   Lab Results  Component Value Date   WBC 7.8 06/29/2022   HGB 14.1 06/29/2022   HCT 44.0 06/29/2022   MCV 85.8 06/29/2022   PLT 241 06/29/2022   Lab Results  Component Value Date   ALT 28 06/29/2022   AST 26 06/29/2022   ALKPHOS 84 06/29/2022   BILITOT 0.3 06/29/2022   No results found for: "25OHVITD2", "25OHVITD3", "VD25OH"   Review of Systems  Constitutional:  Negative for chills, fatigue and fever.  Gastrointestinal:  Positive for abdominal pain.  Genitourinary:  Positive for dysuria and urgency.    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[place]) 02/18/2019   Lumbosacral spondylosis without myelopathy 11/22/2018   Vertigo 10/09/2018   Hyperlipidemia associated with type 2 diabetes mellitus ([place]) 04/11/2018   Gastroparesis 04/11/2018   Long term (current) use of aromatase inhibitors 12/25/2017   Arthritis    Elevated LFTs    Essential hypertension    Positive PPD, treated    Umbilical hernia without obstruction and without gangrene 07/13/2016   Irritable bowel syndrome with both constipation and diarrhea 07/20/2015   Type II diabetes mellitus with complication ([place]) [id number]   Pre-ulcerative calluses 06/22/2015   Neuropathy 06/22/2015   Hot flash, menopausal 04/09/2015   Atherosclerotic peripheral vascular disease with intermittent claudication ([place]) 03/25/2015   Osteoporosis of lumbar spine 03/12/2015   Malignant neoplasm of left breast in female, estrogen receptor positive ([place]) 05/30/2014   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    No Known Allergies  Past Surgical History:  Procedure Laterality Date   BIOPSY  11/11/2021   Procedure: BIOPSY;  Surgeon: Irving Copas., MD;  Location: [place];  Service: Gastroenterology;;   BREAST BIOPSY Left 07/31/2014  [facility] and DCIS    BREAST BIOPSY Left 03/08/2016   INTRADUCTAL PAPILLOMA    BREAST LUMPECTOMY Left 07/2014   [facility], DCIS, clear margins, positive LN   BREAST LUMPECTOMY Left 04/05/2016   excision of intraductal papilloma, no malignancy or atypia   BREAST LUMPECTOMY WITH NEEDLE LOCALIZATION Left 04/05/2016   Procedure: BREAST LUMPECTOMY WITH NEEDLE LOCALIZATION;  Surgeon: Hubbard Robinson, MD;  Location: [facility];  Service: General;  Laterality: Left;   BREAST LUMPECTOMY WITH NEEDLE LOCALIZATION AND AXILLARY SENTINEL  LYMPH NODE BX Left 08/27/14   CHOLECYSTECTOMY  09/07/2013   COLONOSCOPY WITH PROPOFOL N/A 08/28/2015   Procedure: COLONOSCOPY WITH PROPOFOL;  Surgeon: Lucilla Lame, MD;  Location: [place];  Service: Endoscopy;  Laterality: N/A;  Diabetic oral   COLONOSCOPY WITH PROPOFOL N/A 08/14/2020   Procedure: COLONOSCOPY WITH PROPOFOL;  Surgeon: Virgel Manifold, MD;  Location: [facility];  Service: Endoscopy;  Laterality: N/A;   ENDOSCOPIC MUCOSAL RESECTION N/A 09/10/2020   Procedure: ENDOSCOPIC MUCOSAL RESECTION;  Surgeon: Rush Landmark Telford Nab., MD;  Location: [place];  Service: Gastroenterology;  Laterality: N/A;   ESOPHAGOGASTRODUODENOSCOPY  2014   gastritis   ESOPHAGOGASTRODUODENOSCOPY (EGD) WITH PROPOFOL N/A 04/16/2019   Procedure: ESOPHAGOGASTRODUODENOSCOPY (EGD) WITH PROPOFOL;  Surgeon: Virgel Manifold, MD;  Location: [facility];  Service: Endoscopy;  Laterality: N/A;   ESOPHAGOGASTRODUODENOSCOPY (EGD) WITH PROPOFOL N/A 10/03/2019   Procedure: ESOPHAGOGASTRODUODENOSCOPY (EGD) WITH PROPOFOL;  Surgeon: Virgel Manifold, MD;  Location: [facility];  Service: Endoscopy;  Laterality: N/A;   EUS N/A 09/10/2020   Procedure: LOWER ENDOSCOPIC ULTRASOUND (EUS);  Surgeon: Irving Copas., MD;  Location: [place];  Service: Gastroenterology;  Laterality: N/A;   EUS N/A 11/11/2021   Procedure: LOWER ENDOSCOPIC ULTRASOUND (EUS);  Surgeon: Irving Copas., MD;  Location: [place];  Service: Gastroenterology;  Laterality: N/A;   FLEXIBLE SIGMOIDOSCOPY N/A 08/27/2020   Procedure: FLEXIBLE SIGMOIDOSCOPY;  Surgeon: Virgel Manifold, MD;  Location: [facility];  Service: Endoscopy;  Laterality: N/A;   FLEXIBLE SIGMOIDOSCOPY N/A 09/10/2020   Procedure: FLEXIBLE SIGMOIDOSCOPY;  Surgeon: Rush Landmark Telford Nab., MD;  Location: [place];  Service: Gastroenterology;  Laterality: N/A;   FLEXIBLE SIGMOIDOSCOPY N/A 11/11/2021   Procedure: FLEXIBLE  SIGMOIDOSCOPY;  Surgeon: Rush Landmark Telford Nab., MD;  Location: [place];  Service: Gastroenterology;  Laterality: N/A;   FOREIGN BODY REMOVAL  09/10/2020   Procedure: FOREIGN BODY REMOVAL ;  Surgeon: Rush Landmark Telford Nab., MD;  Location: [place];  Service: Gastroenterology;;   HEMOSTASIS CLIP PLACEMENT  09/10/2020   Procedure: HEMOSTASIS CLIP PLACEMENT;  Surgeon: Irving Copas., MD;  Location: [place];  Service: Gastroenterology;;   HYSTEROSCOPY WITH D & C N/A 03/25/2021   Procedure: DILATATION AND CURETTAGE [place];  Surgeon: Malachy Mood, MD;  Location: [facility];  Service: Gynecology;  Laterality: N/A;   POLYPECTOMY  08/28/2015   Procedure: POLYPECTOMY INTESTINAL;  Surgeon: Lucilla Lame, MD;  Location: [place];  Service: Endoscopy;;  Rectal polyp   SUBMUCOSAL LIFTING INJECTION  09/10/2020   Procedure: SUBMUCOSAL LIFTING INJECTION;  Surgeon: Irving Copas., MD;  Location: [place];  Service: Gastroenterology;;   TUBAL LIGATION Bilateral    UMBILICAL HERNIA REPAIR N/A 08/09/2016   Procedure: HERNIA REPAIR UMBILICAL ADULT;  Surgeon: Olean Ree, MD;  Location: [facility];  Service: General;  Laterality: N/A;   VENTRAL HERNIA REPAIR N/A 04/10/2015   Procedure: HERNIA REPAIR VENTRAL ADULT;  Surgeon: Leonie Green, MD;  Location: [facility];  Service: General;  Laterality: N/A;  Social History   Tobacco Use   Smoking status: Every Day    Packs/day: 0.50    Years: 45.00    Total pack years: 22.50    Types: Cigarettes   Smokeless tobacco: Never   Tobacco comments:    Smokes 7 ciggs daily.  Vaping Use   Vaping Use: Never used  Substance Use Topics   Alcohol use: Yes    Alcohol/week: 2.0 standard drinks of alcohol    Types: 2 Cans of beer per week    Comment: weekends - beer    Drug use: No     Medication list has been reviewed and updated.  Current Meds  Medication Sig   acetaminophen (TYLENOL) 500 MG tablet Take 1,000  mg by mouth every 6 (six) hours as needed for moderate pain.   albuterol (VENTOLIN HFA) 108 (90 Base) MCG/ACT inhaler INHALE 2 PUFFS INTO THE LUNGS EVERY 6 HOURS AS NEEDED FOR WHEEZING OR SHORTNESS OF BREATH   amLODipine (NORVASC) 10 MG tablet Take 1 tablet (10 mg total) by mouth daily.   aspirin EC 81 MG tablet Take 81 mg by mouth daily.   atorvastatin (LIPITOR) 10 MG tablet Take 1 tablet (10 mg total) by mouth at bedtime.   Blood Glucose Calibration (TRUE METRIX LEVEL 1) Low SOLN 1 each by In Vitro route daily as needed.   Blood Glucose Monitoring Suppl (TRUE METRIX METER) DEVI 1 each by Does not apply route daily.   glucose blood test strip Test BID   irbesartan (AVAPRO) 300 MG tablet Take 1 tablet (300 mg total) by mouth daily.   metFORMIN (GLUCOPHAGE-XR) 500 MG 24 hr tablet TAKE ONE TABLET BY MOUTH EVERY MORNING WITH BREAKFAST   methocarbamol (ROBAXIN) 500 MG tablet Take 500 mg by mouth at bedtime.   Multiple Vitamins-Minerals (MULTIVITAMIN WITH MINERALS) tablet Take 1 tablet by mouth daily.   sulfamethoxazole-trimethoprim (BACTRIM DS) 800-160 MG tablet Take 1 tablet by mouth 2 (two) times daily for 7 days.   triamterene-hydrochlorothiazide (MAXZIDE-25) 37.5-25 MG tablet Take 1 tablet by mouth daily.       03/17/2022    9:27 AM 11/03/2021    8:26 AM 07/06/2021    9:01 AM 07/06/2021    8:58 AM  GAD 7 : Generalized Anxiety Score  Nervous, Anxious, on Edge 0 0 0 0  Control/stop worrying 1 0 0 0  Worry too much - different things 1 0 1 1  Trouble relaxing 0 0 0 0  Restless 1 1 0 0  Easily annoyed or irritable 1 0 0 0  Afraid - awful might happen 0 0 0 0  Total GAD 7 Score 4 1 1 1  $ Anxiety Difficulty Not difficult at all Not difficult at all Not difficult at all Not difficult at all       06/27/2022    2:39 PM 03/17/2022    9:27 AM 11/03/2021    8:26 AM  Depression screen PHQ 2/9  Decreased Interest 1 1 1  $ Down, Depressed, Hopeless 1 1 3  $ PHQ - 2 Score 2 2 4  $ Altered sleeping 0 1 2   Tired, decreased energy 1 0 2  Change in appetite 0 1 1  Feeling bad or failure about yourself  0 0 0  Trouble concentrating 0 0 0  Moving slowly or fidgety/restless 0 0 1  Suicidal thoughts 0 0 0  PHQ-9 Score 3 4 10  $ Difficult doing work/chores Somewhat difficult Not difficult at all Not difficult at all  BP Readings from Last 3 Encounters:  07/15/22 128/70  03/17/22 128/60  03/07/22 139/64    Physical Exam Vitals and nursing note reviewed.  Constitutional:      Appearance: She is well-developed.  Cardiovascular:     Rate and Rhythm: Normal rate and regular rhythm.     Heart sounds: Normal heart sounds.  Pulmonary:     Effort: Pulmonary effort is normal. No respiratory distress.     Breath sounds: Normal breath sounds.  Abdominal:     General: Bowel sounds are normal.     Palpations: Abdomen is soft.     Tenderness: There is abdominal tenderness in the suprapubic area. There is no right CVA tenderness, left CVA tenderness, guarding or rebound.    Urine dipstick shows positive for WBC's and positive for RBC's.  Micro exam: not done.  Wt Readings from Last 3 Encounters:  07/15/22 208 lb (94.3 kg)  03/17/22 214 lb (97.1 kg)  03/07/22 211 lb 8 oz (95.9 kg)    BP 128/70 (BP Location: Right Arm, Cuff Size: Large)   Pulse 86   Ht 5' 8"$  (1.727 m)   Wt 208 lb (94.3 kg)   SpO2 97%   BMI 31.63 kg/m   Assessment and Plan: Problem List Items Addressed This Visit   None Visit Diagnoses     Pelvic pain    -  Primary   Relevant Orders   POCT Urinalysis Dipstick (Completed)   Urine Culture   Acute cystitis with hematuria       Continue to push fluids will send for culture and advise if medication change is needed   Relevant Medications   sulfamethoxazole-trimethoprim (BACTRIM DS) 800-160 MG tablet        Partially dictated using Editor, commissioning. Any errors are unintentional.  Halina Maidens, MD [facility]  07/15/2022

## 2022-07-18 ENCOUNTER — Other Ambulatory Visit: Payer: Self-pay

## 2022-07-18 ENCOUNTER — Encounter: Payer: Self-pay | Admitting: Emergency Medicine

## 2022-07-18 ENCOUNTER — Emergency Department
Admission: EM | Admit: 2022-07-18 | Discharge: 2022-07-18 | Disposition: A | Discharge status: Home / Self Care | Payer: 59 | Attending: Emergency Medicine | Admitting: Emergency Medicine

## 2022-07-18 ENCOUNTER — Emergency Department: Payer: 59

## 2022-07-18 ENCOUNTER — Ambulatory Visit: Payer: Self-pay | Admitting: *Deleted

## 2022-07-18 DIAGNOSIS — I700 Atherosclerosis of aorta: Secondary | ICD-10-CM | POA: Diagnosis not present

## 2022-07-18 DIAGNOSIS — K760 Fatty (change of) liver, not elsewhere classified: Secondary | ICD-10-CM | POA: Diagnosis not present

## 2022-07-18 DIAGNOSIS — N12 Tubulo-interstitial nephritis, not specified as acute or chronic: Secondary | ICD-10-CM

## 2022-07-18 DIAGNOSIS — R109 Unspecified abdominal pain: Secondary | ICD-10-CM | POA: Diagnosis not present

## 2022-07-18 DIAGNOSIS — Z9049 Acquired absence of other specified parts of digestive tract: Secondary | ICD-10-CM | POA: Diagnosis not present

## 2022-07-18 LAB — COMPREHENSIVE METABOLIC PANEL
ALT: 68 U/L — ABNORMAL HIGH (ref 0–44)
AST: 69 U/L — ABNORMAL HIGH (ref 15–41)
Albumin: 4.1 g/dL (ref 3.5–5.0)
Alkaline Phosphatase: 111 U/L (ref 38–126)
Anion gap: 10 (ref 5–15)
BUN: 13 mg/dL (ref 8–23)
CO2: 22 mmol/L (ref 22–32)
Calcium: 8.7 mg/dL — ABNORMAL LOW (ref 8.9–10.3)
Chloride: 102 mmol/L (ref 98–111)
Creatinine, Ser: 1.06 mg/dL — ABNORMAL HIGH (ref 0.44–1.00)
GFR, Estimated: 59 mL/min — ABNORMAL LOW (ref >60)
Glucose, Bld: 116 mg/dL — ABNORMAL HIGH (ref 70–99)
Potassium: 3.5 mmol/L (ref 3.5–5.1)
Sodium: 134 mmol/L — ABNORMAL LOW (ref 135–145)
Total Bilirubin: 0.4 mg/dL (ref 0.3–1.2)
Total Protein: 7.6 g/dL (ref 6.5–8.1)

## 2022-07-18 LAB — CBC WITH DIFFERENTIAL/PLATELET
Abs Immature Granulocytes: 0.03 10*3/uL (ref 0.00–0.07)
Basophils Absolute: 0.1 10*3/uL (ref 0.0–0.1)
Basophils Relative: 1 %
Eosinophils Absolute: 0.2 10*3/uL (ref 0.0–0.5)
Eosinophils Relative: 3 %
HCT: 44.6 % (ref 36.0–46.0)
Hemoglobin: 14.1 g/dL (ref 12.0–15.0)
Immature Granulocytes: 0 %
Lymphocytes Relative: 20 %
Lymphs Abs: 1.6 10*3/uL (ref 0.7–4.0)
MCH: 26.8 pg (ref 26.0–34.0)
MCHC: 31.6 g/dL (ref 30.0–36.0)
MCV: 84.8 fL (ref 80.0–100.0)
Monocytes Absolute: 0.4 10*3/uL (ref 0.1–1.0)
Monocytes Relative: 5 %
Neutro Abs: 5.7 10*3/uL (ref 1.7–7.7)
Neutrophils Relative %: 71 %
Platelets: 212 10*3/uL (ref 150–400)
RBC: 5.26 MIL/uL — ABNORMAL HIGH (ref 3.87–5.11)
RDW: 14.6 % (ref 11.5–15.5)
WBC: 8 10*3/uL (ref 4.0–10.5)
nRBC: 0 % (ref 0.0–0.2)

## 2022-07-18 LAB — URINALYSIS, ROUTINE W REFLEX MICROSCOPIC
Bacteria, UA: NONE SEEN
Bilirubin Urine: NEGATIVE
Glucose, UA: NEGATIVE mg/dL
Ketones, ur: NEGATIVE mg/dL
Leukocytes,Ua: NEGATIVE
Nitrite: NEGATIVE
Protein, ur: 100 mg/dL — AB
Specific Gravity, Urine: 1.018 (ref 1.005–1.030)
pH: 5 (ref 5.0–8.0)

## 2022-07-18 LAB — URINE CULTURE

## 2022-07-18 MED ORDER — PHENAZOPYRIDINE HCL 95 MG PO TABS: 95.0000 mg | ORAL_TABLET | Freq: Three times a day (TID) | ORAL | 0 refills | Status: DC | PRN

## 2022-07-18 MED ORDER — CEPHALEXIN 500 MG PO CAPS: 500.0000 mg | ORAL_CAPSULE | Freq: Four times a day (QID) | ORAL | 0 refills | Status: AC

## 2022-07-18 MED ORDER — CEFTRIAXONE SODIUM 1 G IJ SOLR
1.0000 g | Freq: Once | INTRAMUSCULAR | Status: AC
  Administered 2022-07-18: 1 g via INTRAMUSCULAR
  Filled 2022-07-18: qty 10

## 2022-07-18 NOTE — Telephone Encounter (Signed)
Patient informed. Will go to UC if worsening symptoms. Told her we will call her once Urine Culture returns.  Donna Riley

## 2022-07-18 NOTE — ED Triage Notes (Signed)
C/o right sided flank pain. Pt. Has been taking abx for bladder infection x4 days. Pt. States chills last night and today. Denies fever.

## 2022-07-18 NOTE — ED Provider Notes (Signed)
[facility] Provider Note  Patient Contact: 7:32 PM (approximate)   History   Flank Pain (C/o right sided flank pain. Pt. Has been taking abx for bladder infection x4 days. Pt. States chills last night and today. Denies fever.)   HPI  Donna Riley is a 62 y.o. female who presents emergency complaining of flank pain.  Patient states that she has had right flank pain x 4 days.  Was seen at Ortho thinking that this was her chronic back issue, given injection without relief.  She went saw primary care and her urinalysis was consistent with UTI.  Patient has been on Bactrim with no improvement of her symptoms and presents to the ED for evaluation.  No hematuria, fevers, chills, nausea, vomiting, diarrhea or constipation.  There is no radicular component to this flank pain.  No history of nephrolithiasis.     Physical Exam   Triage Vital Signs: ED Triage Vitals  Enc Vitals Group     BP 07/18/22 1642 (!) 155/71     Pulse Rate 07/18/22 1642 96     Resp 07/18/22 1642 18     Temp 07/18/22 1642 98.1 F (36.7 C)     Temp Source 07/18/22 1642 Oral     SpO2 07/18/22 1642 97 %     Weight 07/18/22 1643 205 lb (93 kg)     Height 07/18/22 1643 5' 10"$  (1.778 m)     Head Circumference --      Peak Flow --      Pain Score 07/18/22 1643 8     Pain Loc --      Pain Edu? --      Excl. in [place]? --     Most recent vital signs: Vitals:   07/18/22 1642  BP: (!) 155/71  Pulse: 96  Resp: 18  Temp: 98.1 F (36.7 C)  SpO2: 97%     General: Alert and in no acute distress.  Cardiovascular:  Good peripheral perfusion Respiratory: Normal respiratory effort without tachypnea or retractions. Lungs CTAB.  Gastrointestinal: Bowel sounds 4 quadrants. Soft and nontender to palpation. No guarding or rigidity. No palpable masses. No distention.  Right-sided CVA tenderness. Musculoskeletal: Full range of motion to all extremities.  Neurologic:  No gross focal neurologic deficits  are appreciated.  Skin:   No rash noted Other:   ED Results / Procedures / Treatments   Labs (all labs ordered are listed, but only abnormal results are displayed) Labs Reviewed  CBC WITH DIFFERENTIAL/PLATELET - Abnormal; Notable for the following components:      Result Value   RBC 5.26 (*)    All other components within normal limits  COMPREHENSIVE METABOLIC PANEL - Abnormal; Notable for the following components:   Sodium 134 (*)    Glucose, Bld 116 (*)    Creatinine, Ser 1.06 (*)    Calcium 8.7 (*)    AST 69 (*)    ALT 68 (*)    GFR, Estimated 59 (*)    All other components within normal limits  URINALYSIS, ROUTINE W REFLEX MICROSCOPIC - Abnormal; Notable for the following components:   Color, Urine YELLOW (*)    APPearance HAZY (*)    Hgb urine dipstick LARGE (*)    Protein, ur 100 (*)    All other components within normal limits  URINE CULTURE     EKG     RADIOLOGY  I personally viewed, evaluated, and interpreted these images as part of my medical  decision making, as well as reviewing the written report by the radiologist.  ED Provider Interpretation: No evidence of nephrolithiasis.  No acute intra-abdominal abnormality.  Patient has no perinephric stranding or hydronephrosis consistent with severe pyelonephritis.  CT Renal Stone Study  Result Date: 07/18/2022 CLINICAL DATA:  Flank pain EXAM: CT ABDOMEN AND PELVIS WITHOUT CONTRAST TECHNIQUE: Multidetector CT imaging of the abdomen and pelvis was performed following the standard protocol without IV contrast. RADIATION DOSE REDUCTION: This exam was performed according to the departmental dose-optimization program which includes automated exposure control, adjustment of the mA and/or kV according to patient size and/or use of iterative reconstruction technique. COMPARISON:  CT 07/08/2022 FINDINGS: Lower chest: No acute abnormality. Hepatobiliary: Hepatic steatosis. Status post cholecystectomy. No biliary dilatation  Pancreas: Unremarkable. No pancreatic ductal dilatation or surrounding inflammatory changes. Spleen: Normal in size without focal abnormality. Adrenals/Urinary Tract: Adrenal glands are unremarkable. Kidneys are normal, without renal calculi, focal lesion, or hydronephrosis. Bladder is unremarkable. Stomach/Bowel: Stomach is within normal limits. Appendix appears normal. No evidence of bowel wall thickening, distention, or inflammatory changes. Vascular/Lymphatic: Mild aortic atherosclerosis. No aneurysm. No suspicious lymph nodes. Reproductive: Uterus and bilateral adnexa are unremarkable. Other: No abdominal wall hernia or abnormality. No abdominopelvic ascites. Musculoskeletal: No acute or significant osseous findings. IMPRESSION: 1. No CT evidence for acute intra-abdominal or pelvic abnormality. Hepatic steatosis. 2. Aortic atherosclerosis. Aortic Atherosclerosis (ICD10-I70.0). Electronically Signed   By: Donavan Foil M.D.   On: 07/18/2022 19:18    PROCEDURES:  Critical Care performed: No  Procedures   MEDICATIONS ORDERED IN ED: Medications  cefTRIAXone (ROCEPHIN) injection 1 g (has no administration in time range)     IMPRESSION / MDM / ASSESSMENT AND PLAN / ED COURSE  I reviewed the triage vital signs and the nursing notes.                                 Differential diagnosis includes, but is not limited to, UTI, pyelonephritis, nephrolithiasis, chronic back pain  Patient's presentation is most consistent with acute presentation with potential threat to life or bodily function.   Patient's diagnosis is consistent with pyelonephritis.  Patient presents emergency department complaining of right flank pain.  She had a positive urinalysis at primary care and was placed on Bactrim.  This has not been an significantly improving her symptoms.  Patient has reassuring white blood cell count at this time.  Labs are otherwise reassuring or at baseline.  Patient did have some hazy urine but no  evidence of nitrates, leukocytes or bacteria but again patient has been on Bactrim.  I have sent a culture for the urine to ensure that the infection is truly susceptible to Bactrim.  Given the 5 that she still symptomatic for UTI despite treatment I will give the patient a shot of Rocephin, change oral antibiotic to cephalexin.  Concerning signs and symptoms and return precautions given to the patient.  Follow-up with primary care as needed..  Patient is given ED precautions to return to the ED for any worsening or new symptoms.     FINAL CLINICAL IMPRESSION(S) / ED DIAGNOSES   Final diagnoses:  Pyelonephritis     Rx / DC Orders   ED Discharge Orders          Ordered    cephALEXin (KEFLEX) 500 MG capsule  4 times daily        07/18/22 1935    phenazopyridine (PYRIDIUM)  95 MG tablet  3 times daily PRN        07/18/22 1935             Note:  This document was prepared using Dragon voice recognition software and may include unintentional dictation errors.   Brynda Peon 07/18/22 1936    Arta Silence, MD 07/19/22 [phone]

## 2022-07-18 NOTE — Telephone Encounter (Signed)
  Chief Complaint: Back pain Symptoms: Lower right sided pain 8/10 when sitting, no pain when walking. Subjective fever "Chills and sweating." Frequency: 07/15/22 Pertinent Negatives: Patient denies Dysuria Disposition: []$ ED /[]$ Urgent Care (no appt availability in office) / []$ Appointment(In office/virtual)/ []$  [facility]/ []$ Home Care/ []$ Refused Recommended Disposition /[]$ [facility]/ [x]$  Follow-up with PCP Additional Notes: Seen in [facility] 216/24, placed on Bactrim for UTI. States started ATB Friday, "No better, back pain worse." No pain when walking, just sitting. Pain is relieved with tylenol. Pt has appt Wednesday AM. "Don't think I can wait that long."   Did not see urine CX resulted. Please advise. Reason for Disposition  [1] Fever > 100.0 F (37.8 C) AND [2] flank pain (i.e., in side, below ribs and above hip)  Answer Assessment - Initial Assessment Questions 1. ONSET: "When did the pain begin?"      Seen OV 07/15/22 7 days prior 2. LOCATION: "Where does it hurt?" (upper, mid or lower back)     Right sided, lower back 3. SEVERITY: "How bad is the pain?"  (e.g., Scale 1-10; mild, moderate, or severe)   - MILD (1-3): Doesn't interfere with normal activities.    - MODERATE (4-7): Interferes with normal activities or awakens from sleep.    - SEVERE (8-10): Excruciating pain, unable to do any normal activities.      "No pain when walking."  8/10 when sitting 4. PATTERN: "Is the pain constant?" (e.g., yes, no; constant, intermittent)      Constant 5. RADIATION: "Does the pain shoot into your legs or somewhere else?"     No 6. CAUSE:  "What do you think is causing the back pain?"      UTI 7. BACK OVERUSE:  "Any recent lifting of heavy objects, strenuous work or exercise?"      8. MEDICINES: "What have you taken so far for the pain?" (e.g., nothing, acetaminophen, NSAIDS)     Tylenol helps 9. NEUROLOGIC SYMPTOMS: "Do you have any weakness, numbness, or problems with  bowel/bladder control?"      10. OTHER SYMPTOMS: "Do you have any other symptoms?" (e.g., fever, abdomen pain, burning with urination, blood in urine)       Subjective fever  Protocols used: Back Pain-A-AH

## 2022-07-18 NOTE — ED Notes (Signed)
Patient transported back from CT 

## 2022-07-20 ENCOUNTER — Ambulatory Visit (INDEPENDENT_AMBULATORY_CARE_PROVIDER_SITE_OTHER): Payer: 59 | Admitting: Internal Medicine

## 2022-07-20 ENCOUNTER — Encounter: Payer: Self-pay | Admitting: Internal Medicine

## 2022-07-20 ENCOUNTER — Other Ambulatory Visit
Admission: RE | Admit: 2022-07-20 | Discharge: 2022-07-20 | Disposition: A | Discharge status: Home / Self Care | Payer: 59 | Attending: Internal Medicine | Admitting: Internal Medicine

## 2022-07-20 VITALS — BP 124/60 | HR 91 | Ht 70.0 in | Wt 205.0 lb

## 2022-07-20 DIAGNOSIS — I10 Essential (primary) hypertension: Secondary | ICD-10-CM | POA: Diagnosis not present

## 2022-07-20 DIAGNOSIS — E785 Hyperlipidemia, unspecified: Secondary | ICD-10-CM

## 2022-07-20 DIAGNOSIS — C773 Secondary and unspecified malignant neoplasm of axilla and upper limb lymph nodes: Secondary | ICD-10-CM | POA: Diagnosis not present

## 2022-07-20 DIAGNOSIS — M8588 Other specified disorders of bone density and structure, other site: Secondary | ICD-10-CM | POA: Diagnosis not present

## 2022-07-20 DIAGNOSIS — E1169 Type 2 diabetes mellitus with other specified complication: Secondary | ICD-10-CM

## 2022-07-20 DIAGNOSIS — I700 Atherosclerosis of aorta: Secondary | ICD-10-CM | POA: Diagnosis not present

## 2022-07-20 DIAGNOSIS — E118 Type 2 diabetes mellitus with unspecified complications: Secondary | ICD-10-CM

## 2022-07-20 DIAGNOSIS — J432 Centrilobular emphysema: Secondary | ICD-10-CM | POA: Diagnosis not present

## 2022-07-20 DIAGNOSIS — N3001 Acute cystitis with hematuria: Secondary | ICD-10-CM | POA: Diagnosis not present

## 2022-07-20 LAB — URINE CULTURE: Culture: NO GROWTH

## 2022-07-20 LAB — LIPID PANEL
Cholesterol: 148 mg/dL (ref 0–200)
HDL: 30 mg/dL — ABNORMAL LOW (ref >40)
LDL Cholesterol: 59 mg/dL (ref 0–99)
Total CHOL/HDL Ratio: 4.9 RATIO
Triglycerides: 293 mg/dL — ABNORMAL HIGH (ref <150)
VLDL: 59 mg/dL — ABNORMAL HIGH (ref 0–40)

## 2022-07-20 LAB — HEMOGLOBIN A1C
Hgb A1c MFr Bld: 6.2 % — ABNORMAL HIGH (ref 4.8–5.6)
Mean Plasma Glucose: 131.24 mg/dL

## 2022-07-20 NOTE — Assessment & Plan Note (Addendum)
On appropriate statin therapy and ASA

## 2022-07-20 NOTE — Assessment & Plan Note (Signed)
No current symptoms of shortness of breath, cough, sputum production Not on maintenance medications - uses albuterol PRN

## 2022-07-20 NOTE — Assessment & Plan Note (Addendum)
Tolerating statin medications without concerns LDL is  Lab Results  Component Value Date   LDLCALC 64 07/06/2021   with a goal of < 70. Current dose will be adjusted if needed.

## 2022-07-20 NOTE — Progress Notes (Signed)
Date:  07/20/2022   Name:  Donna Riley   DOB:  Apr 23, 1961   MRN:  [id number]   Chief Complaint: Diabetes and Hypertension  Hypertension This is a chronic problem. The problem is controlled. Pertinent negatives include no chest pain, headaches, palpitations or shortness of breath. Past treatments include calcium channel blockers, angiotensin blockers and diuretics. The current treatment provides significant improvement.  Diabetes She presents for her follow-up diabetic visit. She has type 2 diabetes mellitus. Her disease course has been stable. Pertinent negatives for hypoglycemia include no headaches, nervousness/anxiousness or tremors. Pertinent negatives for diabetes include no chest pain, no fatigue, no polydipsia and no polyuria. Current diabetic treatment includes oral agent (monotherapy) (metformin).  Hyperlipidemia Pertinent negatives include no chest pain or shortness of breath.  Abdominal pain - had negative urine culture after starting Bactrim.  Went to ED 2 days ago and had negative CT abd/pelvis but was dx's with pyelonephritis and given keflex.  Repeat Cx pending.  She is feeling much better today.  Lab Results  Component Value Date   NA 134 (L) 07/18/2022   K 3.5 07/18/2022   CO2 22 07/18/2022   GLUCOSE 116 (H) 07/18/2022   BUN 13 07/18/2022   CREATININE 1.06 (H) 07/18/2022   CALCIUM 8.7 (L) 07/18/2022   GFRNONAA 59 (L) 07/18/2022   Lab Results  Component Value Date   CHOL 163 07/06/2021   HDL 33 (L) 07/06/2021   LDLCALC 64 07/06/2021   TRIG 331 (H) 07/06/2021   CHOLHDL 4.9 07/06/2021   Lab Results  Component Value Date   TSH 1.765 04/11/2018   Lab Results  Component Value Date   HGBA1C 7.0 (A) 03/17/2022   Lab Results  Component Value Date   WBC 8.0 07/18/2022   HGB 14.1 07/18/2022   HCT 44.6 07/18/2022   MCV 84.8 07/18/2022   PLT 212 07/18/2022   Lab Results  Component Value Date   ALT 68 (H) 07/18/2022   AST 69 (H) 07/18/2022   ALKPHOS 111  07/18/2022   BILITOT 0.4 07/18/2022   No results found for: "25OHVITD2", "25OHVITD3", "VD25OH"   Review of Systems  Constitutional:  Negative for appetite change, fatigue, fever and unexpected weight change.  HENT:  Negative for tinnitus and trouble swallowing.   Eyes:  Negative for visual disturbance.  Respiratory:  Negative for cough, chest tightness and shortness of breath.   Cardiovascular:  Negative for chest pain, palpitations and leg swelling.  Gastrointestinal:  Negative for abdominal pain.  Endocrine: Negative for polydipsia and polyuria.  Genitourinary:  Negative for dysuria and hematuria.  Musculoskeletal:  Positive for arthralgias (mild foot pain) and back pain (mild back pain).  Neurological:  Negative for tremors, numbness and headaches.  Psychiatric/Behavioral:  Negative for dysphoric mood and sleep disturbance. The patient is not nervous/anxious.     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  Lumbosacral spondylosis without myelopathy 11/22/2018   Vertigo 10/09/2018   Hyperlipidemia associated with type 2 diabetes mellitus ([place]) 04/11/2018   Gastroparesis 04/11/2018   Long term (current) use of aromatase inhibitors 12/25/2017   Arthritis    Elevated LFTs    Essential hypertension    Positive PPD, treated    Umbilical hernia without obstruction and without gangrene  07/13/2016   Irritable bowel syndrome with both constipation and diarrhea 07/20/2015   Type II diabetes mellitus with complication ([place]) [id number]   Pre-ulcerative calluses 06/22/2015   Neuropathy 06/22/2015   Hot flash, menopausal 04/09/2015   Atherosclerotic peripheral vascular disease with intermittent claudication ([place]) 03/25/2015   Malignant neoplasm of left breast in female, estrogen receptor positive ([place]) 05/30/2014   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    No Known Allergies  Past Surgical History:  Procedure Laterality Date   BIOPSY  11/11/2021   Procedure: BIOPSY;  Surgeon: Irving Copas., MD;  Location: [facility];  Service: Gastroenterology;;   BREAST BIOPSY Left 07/31/2014   [place] and DCIS    BREAST BIOPSY Left 03/08/2016   INTRADUCTAL PAPILLOMA    BREAST LUMPECTOMY Left 07/2014   IDC, DCIS, clear margins, positive LN   BREAST LUMPECTOMY Left 04/05/2016   excision of intraductal papilloma, no malignancy or atypia   BREAST LUMPECTOMY WITH NEEDLE LOCALIZATION Left 04/05/2016   Procedure: BREAST LUMPECTOMY WITH NEEDLE LOCALIZATION;  Surgeon: Hubbard Robinson, MD;  Location: [facility];  Service: General;  Laterality: Left;   BREAST LUMPECTOMY WITH NEEDLE LOCALIZATION AND AXILLARY SENTINEL LYMPH NODE BX Left 08/27/14   CHOLECYSTECTOMY  09/07/2013   COLONOSCOPY WITH PROPOFOL N/A 08/28/2015   Procedure: COLONOSCOPY WITH PROPOFOL;  Surgeon: Lucilla Lame, MD;  Location: [facility];  Service: Endoscopy;  Laterality: N/A;  Diabetic oral   COLONOSCOPY WITH PROPOFOL N/A 08/14/2020   Procedure: COLONOSCOPY WITH PROPOFOL;  Surgeon: Virgel Manifold, MD;  Location: [facility];  Service: Endoscopy;  Laterality: N/A;   ENDOSCOPIC MUCOSAL RESECTION N/A 09/10/2020   Procedure: ENDOSCOPIC MUCOSAL RESECTION;  Surgeon: Rush Landmark Telford Nab., MD;  Location: [facility];  Service: Gastroenterology;  Laterality: N/A;   ESOPHAGOGASTRODUODENOSCOPY  2014   gastritis    ESOPHAGOGASTRODUODENOSCOPY (EGD) WITH PROPOFOL N/A 04/16/2019   Procedure: ESOPHAGOGASTRODUODENOSCOPY (EGD) WITH PROPOFOL;  Surgeon: Virgel Manifold, MD;  Location: [facility];  Service: Endoscopy;  Laterality: N/A;   ESOPHAGOGASTRODUODENOSCOPY (EGD) WITH PROPOFOL N/A 10/03/2019   Procedure: ESOPHAGOGASTRODUODENOSCOPY (EGD) WITH PROPOFOL;  Surgeon: Virgel Manifold, MD;  Location: [facility];  Service: Endoscopy;  Laterality: N/A;   EUS N/A 09/10/2020   Procedure: LOWER ENDOSCOPIC ULTRASOUND (EUS);  Surgeon: Irving Copas., MD;  Location: [facility];  Service: Gastroenterology;  Laterality: N/A;   EUS N/A 11/11/2021   Procedure: LOWER ENDOSCOPIC ULTRASOUND (EUS);  Surgeon: Irving Copas., MD;  Location: [facility];  Service: Gastroenterology;  Laterality: N/A;   FLEXIBLE SIGMOIDOSCOPY N/A 08/27/2020   Procedure: FLEXIBLE SIGMOIDOSCOPY;  Surgeon: Virgel Manifold, MD;  Location: [facility];  Service: Endoscopy;  Laterality: N/A;   FLEXIBLE SIGMOIDOSCOPY N/A 09/10/2020   Procedure: FLEXIBLE SIGMOIDOSCOPY;  Surgeon: Rush Landmark Telford Nab., MD;  Location: [facility];  Service: Gastroenterology;  Laterality: N/A;   FLEXIBLE SIGMOIDOSCOPY N/A 11/11/2021   Procedure: FLEXIBLE SIGMOIDOSCOPY;  Surgeon: Rush Landmark Telford Nab., MD;  Location: [facility];  Service: Gastroenterology;  Laterality: N/A;   FOREIGN BODY REMOVAL  09/10/2020   Procedure: FOREIGN BODY REMOVAL ;  Surgeon: Rush Landmark Telford Nab., MD;  Location: [facility];  Service: Gastroenterology;;  HEMOSTASIS CLIP PLACEMENT  09/10/2020   Procedure: HEMOSTASIS CLIP PLACEMENT;  Surgeon: Irving Copas., MD;  Location: [place];  Service: Gastroenterology;;   HYSTEROSCOPY WITH D & C N/A 03/25/2021   Procedure: DILATATION AND CURETTAGE [place];  Surgeon: Malachy Mood, MD;  Location: [facility];  Service: Gynecology;  Laterality: N/A;   POLYPECTOMY  08/28/2015   Procedure: POLYPECTOMY  INTESTINAL;  Surgeon: Lucilla Lame, MD;  Location: [facility];  Service: Endoscopy;;  Rectal polyp   SUBMUCOSAL LIFTING INJECTION  09/10/2020   Procedure: SUBMUCOSAL LIFTING INJECTION;  Surgeon: Irving Copas., MD;  Location: [facility];  Service: Gastroenterology;;   TUBAL LIGATION Bilateral    UMBILICAL HERNIA REPAIR N/A 08/09/2016   Procedure: HERNIA REPAIR UMBILICAL ADULT;  Surgeon: Olean Ree, MD;  Location: [facility];  Service: General;  Laterality: N/A;   VENTRAL HERNIA REPAIR N/A 04/10/2015   Procedure: HERNIA REPAIR VENTRAL ADULT;  Surgeon: Leonie Green, MD;  Location: [facility];  Service: General;  Laterality: N/A;    Social History   Tobacco Use   Smoking status: Every Day    Packs/day: 0.50    Years: 45.00    Total pack years: 22.50    Types: Cigarettes   Smokeless tobacco: Never   Tobacco comments:    Smokes 7 ciggs daily.  Vaping Use   Vaping Use: Never used  Substance Use Topics   Alcohol use: Yes    Alcohol/week: 2.0 standard drinks of alcohol    Types: 2 Cans of beer per week    Comment: weekends - beer    Drug use: No     Medication list has been reviewed and updated.  Current Meds  Medication Sig   acetaminophen (TYLENOL) 500 MG tablet Take 1,000 mg by mouth every 6 (six) hours as needed for moderate pain.   albuterol (VENTOLIN HFA) 108 (90 Base) MCG/ACT inhaler INHALE 2 PUFFS INTO THE LUNGS EVERY 6 HOURS AS NEEDED FOR WHEEZING OR SHORTNESS OF BREATH   amLODipine (NORVASC) 10 MG tablet Take 1 tablet (10 mg total) by mouth daily.   aspirin EC 81 MG tablet Take 81 mg by mouth daily.   atorvastatin (LIPITOR) 10 MG tablet Take 1 tablet (10 mg total) by mouth at bedtime.   Blood Glucose Calibration (TRUE METRIX LEVEL 1) Low SOLN 1 each by In Vitro route daily as needed.   Blood Glucose Monitoring Suppl (TRUE METRIX METER) DEVI 1 each by Does not apply route daily.   cephALEXin (KEFLEX) 500 MG capsule Take 1 capsule (500 mg total) by  mouth 4 (four) times daily for 7 days.   glucose blood test strip Test BID   irbesartan (AVAPRO) 300 MG tablet Take 1 tablet (300 mg total) by mouth daily.   metFORMIN (GLUCOPHAGE-XR) 500 MG 24 hr tablet TAKE ONE TABLET BY MOUTH EVERY MORNING WITH BREAKFAST   methocarbamol (ROBAXIN) 500 MG tablet Take 500 mg by mouth at bedtime.   Multiple Vitamins-Minerals (MULTIVITAMIN WITH MINERALS) tablet Take 1 tablet by mouth daily.   phenazopyridine (PYRIDIUM) 95 MG tablet Take 1 tablet (95 mg total) by mouth 3 (three) times daily as needed for pain.   triamterene-hydrochlorothiazide (MAXZIDE-25) 37.5-25 MG tablet Take 1 tablet by mouth daily.       07/20/2022   10:35 AM 03/17/2022    9:27 AM 11/03/2021    8:26 AM 07/06/2021    9:01 AM  GAD 7 : Generalized Anxiety Score  Nervous, Anxious, on Edge 0 0 0 0  Control/stop worrying 0 1 0 0  Worry too much - different things 0 1 0 1  Trouble relaxing 0 0 0 0  Restless 1 1 1 $ 0  Easily annoyed or irritable 1 1 0 0  Afraid - awful might happen 1 0 0 0  Total GAD 7 Score 3 4 1 1  $ Anxiety Difficulty Not difficult at all Not difficult at all Not difficult at all Not difficult at all       07/20/2022   10:35 AM 06/27/2022    2:39 PM 03/17/2022    9:27 AM  Depression screen PHQ 2/9  Decreased Interest 0 1 1  Down, Depressed, Hopeless 0 1 1  PHQ - 2 Score 0 2 2  Altered sleeping 1 0 1  Tired, decreased energy 1 1 0  Change in appetite 1 0 1  Feeling bad or failure about yourself  0 0 0  Trouble concentrating 0 0 0  Moving slowly or fidgety/restless 0 0 0  Suicidal thoughts 0 0 0  PHQ-9 Score 3 3 4  $ Difficult doing work/chores Not difficult at all Somewhat difficult Not difficult at all    BP Readings from Last 3 Encounters:  07/20/22 124/60  07/18/22 (!) 155/71  07/15/22 128/70    Physical Exam Vitals and nursing note reviewed.  Constitutional:      General: She is not in acute distress.    Appearance: Normal appearance. She is  well-developed.  HENT:     Head: Normocephalic and atraumatic.  Neck:     Vascular: No carotid bruit.  Cardiovascular:     Rate and Rhythm: Normal rate and regular rhythm.  Pulmonary:     Effort: Pulmonary effort is normal. No respiratory distress.     Breath sounds: No wheezing or rhonchi.  Musculoskeletal:     Cervical back: Normal range of motion.     Right lower leg: No edema.     Left lower leg: No edema.  Lymphadenopathy:     Cervical: No cervical adenopathy.  Skin:    General: Skin is warm and dry.     Capillary Refill: Capillary refill takes less than 2 seconds.     Findings: No rash.  Neurological:     General: No focal deficit present.     Mental Status: She is alert and oriented to person, place, and time.  Psychiatric:        Mood and Affect: Mood normal.        Behavior: Behavior normal.    Diabetic Foot Exam - Simple   Simple Foot Form Diabetic Foot exam was performed with the following findings: Yes 07/20/2022 11:01 AM  Visual Inspection No deformities, no ulcerations, no other skin breakdown bilaterally: Yes Sensation Testing Intact to touch and monofilament testing bilaterally: Yes Pulse Check Posterior Tibialis and Dorsalis pulse intact bilaterally: Yes Comments      Wt Readings from Last 3 Encounters:  07/20/22 205 lb (93 kg)  07/18/22 205 lb (93 kg)  07/15/22 208 lb (94.3 kg)    BP 124/60 (BP Location: Right Arm, Cuff Size: Large)   Pulse 91   Ht 5' 10"$  (1.778 m)   Wt 205 lb (93 kg)   SpO2 96%   BMI 29.41 kg/m   Assessment and Plan: Problem List Items Addressed This Visit       Cardiovascular and Mediastinum   Atherosclerosis of abdominal aorta (HCC) (Chronic)    On appropriate statin therapy and ASA  Essential hypertension - Primary (Chronic)    Clinically stable exam with well controlled BP on avapro, hctz and amlodipine . Tolerating medications without side effects. Pt to continue current regimen and low sodium diet.          Respiratory   Centrilobular emphysema (HCC) (Chronic)    No current symptoms of shortness of breath, cough, sputum production Not on maintenance medications - uses albuterol PRN        Endocrine   Hyperlipidemia associated with type 2 diabetes mellitus ([place]) (Chronic)    Tolerating statin medications without concerns LDL is  Lab Results  Component Value Date   LDLCALC 64 07/06/2021  with a goal of < 70. Current dose will be adjusted if needed.       Relevant Orders   Lipid panel   Type II diabetes mellitus with complication (HCC) (Chronic)    Clinically stable without s/s of hypoglycemia. Tolerating metformin well without side effects or other concerns. 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 Any errors are unintentional.  Halina Maidens, MD [facility]  07/20/2022

## 2022-07-20 NOTE — Assessment & Plan Note (Addendum)
Clinically stable without s/s of hypoglycemia. Tolerating metformin well without side effects or other concerns. Lab Results  Component Value Date   HGBA1C 7.0 (A) 03/17/2022

## 2022-07-20 NOTE — Assessment & Plan Note (Signed)
Clinically stable exam with well controlled BP on avapro, hctz and amlodipine . Tolerating medications without side effects. Pt to continue current regimen and low sodium diet.

## 2022-07-20 NOTE — Assessment & Plan Note (Signed)
DEXA in 2021 showed improvement from osteoporosis

## 2022-07-20 NOTE — Assessment & Plan Note (Signed)
Followed closely by Oncology

## 2022-07-22 ENCOUNTER — Telehealth: Payer: Self-pay

## 2022-07-22 LAB — MICROALBUMIN / CREATININE URINE RATIO
Creatinine, Urine: 385.2 mg/dL
Microalb Creat Ratio: 49 mg/g{creat} — ABNORMAL HIGH (ref 0–29)
Microalb, Ur: 186.9 ug/mL — ABNORMAL HIGH

## 2022-07-22 NOTE — Telephone Encounter (Signed)
        Patient  visited [facility] on 2/19    Telephone encounter attempt :  1st  A HIPAA compliant voice message was left requesting a return call.  Instructed patient to call back    [facility] [phone] [address], [place], [place] [address] Phone: [phone] Email: [email or web address]

## 2022-07-25 ENCOUNTER — Telehealth: Payer: Self-pay

## 2022-07-25 NOTE — Telephone Encounter (Signed)
     Patient  visit on 2/19  at [facility]   Have you been able to follow up with your primary care physician? Yes   The patient was or was not able to obtain any needed medicine or equipment. Yes   Are there diet recommendations that you are having difficulty following? Na   Patient expresses understanding of discharge instructions and education provided has no other needs at this time. Yes     Donna Riley [facility] Guide, [place] [phone] [address], [place],  [address] Phone: [phone] Email: [email or web address]    

## 2022-07-26 ENCOUNTER — Ambulatory Visit: Payer: Self-pay | Admitting: *Deleted

## 2022-07-26 NOTE — Telephone Encounter (Signed)
Reason for Disposition  Age > 60 years  Answer Assessment - Initial Assessment Questions 1. LOCATION: "Where does it hurt?"      Right side pain.    Last Wed. I saw Dr. Army Melia.    I have protein in my urine and it's high.   My lower side hurts on my right hand side. Started yesterday.   No diarrhea. 2. RADIATION: "Does the pain shoot anywhere else?" (e.g., chest, back)     Just in my lower side. 3. ONSET: "When did the pain begin?" (e.g., minutes, hours or days ago)      Started yesterday     I finished all my antibiotics  yesterday.    I went to the ED and they gave me a different rx. Than Dr. Army Melia.     Dr. Army Melia said I had a kidney stone.   4. SUDDEN: "Gradual or sudden onset?"     Gradually getting worse.    I have to hold my side when I sit down. 5. PATTERN "Does the pain come and go, or is it constant?"    - If it comes and goes: "How long does it last?" "Do you have pain now?"     (Note: Comes and goes means the pain is intermittent. It goes away completely between bouts.)    - If constant: "Is it getting better, staying the same, or getting worse?"      (Note: Constant means the pain never goes away completely; most serious pain is constant and gets worse.)      There all the time. 6. SEVERITY: "How bad is the pain?"  (e.g., Scale 1-10; mild, moderate, or severe)    - MILD (1-3): Doesn't interfere with normal activities, abdomen soft and not tender to touch.     - MODERATE (4-7): Interferes with normal activities or awakens from sleep, abdomen tender to touch.     - SEVERE (8-10): Excruciating pain, doubled over, unable to do any normal activities.       7/10 7. RECURRENT SYMPTOM: "Have you ever had this type of stomach pain before?" If Yes, ask: "When was the last time?" and "What happened that time?"     Dr. Army Melia said I have protein in my urine.    Dr. Army Melia told me to keep taking my BP pills.    Dr. Army Melia said this was an aftereffect of the kidney stone the  protein in my urine. 8. CAUSE: "What do you think is causing the stomach pain?"     I don't know.    9. RELIEVING/AGGRAVATING FACTORS: "What makes it better or worse?" (e.g., antacids, bending or twisting motion, bowel movement)     When I sit down I have to hold my side so it doesn't hurt so bad.   10. OTHER SYMPTOMS: "Do you have any other symptoms?" (e.g., back pain, diarrhea, fever, urination pain, vomiting)       No diarrhea.  My urine is yellow again.   No burning or frequency. 11. PREGNANCY: "Is there any chance you are pregnant?" "When was your last menstrual period?"       N/A due to age  Protocols used: Abdominal Pain - [facility]

## 2022-07-26 NOTE — Telephone Encounter (Signed)
  Chief Complaint: Right sided pain  Symptoms: Pain in right side.   She has to hold her right side of stomach when sits down and sitting to help with the pain.   Is taking Tylenol. Frequency: Started 07/25/2022.   (Was seen in ED then f/u with Dr. Army Melia last week).   This is a different pain per pt. Pertinent Negatives: Patient denies diarrhea, vomiting, blood in urine, burning with urination or frequency.   Completed all the antibiotics she was given in the ED. Disposition: []$ ED /[]$ Urgent Care (no appt availability in office) / [x]$ Appointment(In office/virtual)/ []$  [facility]/ []$ Home Care/ []$ Refused Recommended Disposition /[]$ [facility]/ []$  Follow-up with PCP Additional Notes: Appt. Made for 07/27/2022 at 1:40 with Dr. Army Melia.

## 2022-07-27 ENCOUNTER — Ambulatory Visit: Payer: 59 | Admitting: Internal Medicine

## 2022-08-17 ENCOUNTER — Ambulatory Visit: Payer: 59

## 2022-08-17 ENCOUNTER — Ambulatory Visit
Admission: RE | Admit: 2022-08-17 | Discharge: 2022-08-17 | Disposition: A | Discharge status: Home / Self Care | Payer: 59 | Source: Ambulatory Visit | Attending: Acute Care | Admitting: Acute Care

## 2022-08-17 DIAGNOSIS — Z87891 Personal history of nicotine dependence: Secondary | ICD-10-CM | POA: Diagnosis not present

## 2022-08-17 DIAGNOSIS — F17210 Nicotine dependence, cigarettes, uncomplicated: Secondary | ICD-10-CM | POA: Diagnosis not present

## 2022-08-17 DIAGNOSIS — R911 Solitary pulmonary nodule: Secondary | ICD-10-CM

## 2022-08-19 ENCOUNTER — Other Ambulatory Visit: Payer: Self-pay | Admitting: Acute Care

## 2022-08-19 DIAGNOSIS — Z87891 Personal history of nicotine dependence: Secondary | ICD-10-CM

## 2022-08-19 DIAGNOSIS — Z122 Encounter for screening for malignant neoplasm of respiratory organs: Secondary | ICD-10-CM

## 2022-08-19 DIAGNOSIS — F17210 Nicotine dependence, cigarettes, uncomplicated: Secondary | ICD-10-CM

## 2022-09-06 ENCOUNTER — Ambulatory Visit
Admission: RE | Admit: 2022-09-06 | Discharge: 2022-09-06 | Disposition: A | Discharge status: Home / Self Care | Payer: 59 | Source: Ambulatory Visit | Attending: Oncology | Admitting: Oncology

## 2022-09-06 DIAGNOSIS — Z170 Estrogen receptor positive status [ER+]: Secondary | ICD-10-CM | POA: Diagnosis not present

## 2022-09-06 DIAGNOSIS — C50912 Malignant neoplasm of unspecified site of left female breast: Secondary | ICD-10-CM

## 2022-09-06 DIAGNOSIS — Z1231 Encounter for screening mammogram for malignant neoplasm of breast: Secondary | ICD-10-CM | POA: Insufficient documentation

## 2022-09-13 ENCOUNTER — Inpatient Hospital Stay: Payer: 59

## 2022-09-13 ENCOUNTER — Encounter: Payer: Self-pay | Admitting: Oncology

## 2022-09-13 ENCOUNTER — Inpatient Hospital Stay: Payer: 59 | Attending: Oncology | Admitting: Oncology

## 2022-09-13 VITALS — BP 142/67 | HR 95 | Temp 96.6°F | Resp 18 | Wt 204.7 lb

## 2022-09-13 DIAGNOSIS — R911 Solitary pulmonary nodule: Secondary | ICD-10-CM | POA: Diagnosis not present

## 2022-09-13 DIAGNOSIS — C7A8 Other malignant neuroendocrine tumors: Secondary | ICD-10-CM

## 2022-09-13 DIAGNOSIS — Z79899 Other long term (current) drug therapy: Secondary | ICD-10-CM | POA: Diagnosis not present

## 2022-09-13 DIAGNOSIS — Z170 Estrogen receptor positive status [ER+]: Secondary | ICD-10-CM | POA: Diagnosis not present

## 2022-09-13 DIAGNOSIS — Z853 Personal history of malignant neoplasm of breast: Secondary | ICD-10-CM | POA: Insufficient documentation

## 2022-09-13 DIAGNOSIS — C7A026 Malignant carcinoid tumor of the rectum: Secondary | ICD-10-CM | POA: Insufficient documentation

## 2022-09-13 DIAGNOSIS — M8588 Other specified disorders of bone density and structure, other site: Secondary | ICD-10-CM | POA: Diagnosis not present

## 2022-09-13 DIAGNOSIS — C50912 Malignant neoplasm of unspecified site of left female breast: Secondary | ICD-10-CM

## 2022-09-13 DIAGNOSIS — F17210 Nicotine dependence, cigarettes, uncomplicated: Secondary | ICD-10-CM | POA: Insufficient documentation

## 2022-09-13 LAB — CMP (CANCER CENTER ONLY)
ALT: 24 U/L (ref 0–44)
AST: 26 U/L (ref 15–41)
Albumin: 4.2 g/dL (ref 3.5–5.0)
Alkaline Phosphatase: 93 U/L (ref 38–126)
Anion gap: 9 (ref 5–15)
BUN: 20 mg/dL (ref 8–23)
CO2: 23 mmol/L (ref 22–32)
Calcium: 9.3 mg/dL (ref 8.9–10.3)
Chloride: 103 mmol/L (ref 98–111)
Creatinine: 0.86 mg/dL (ref 0.44–1.00)
GFR, Estimated: >60 mL/min (ref >60)
Glucose, Bld: 117 mg/dL — ABNORMAL HIGH (ref 70–99)
Potassium: 3.8 mmol/L (ref 3.5–5.1)
Sodium: 135 mmol/L (ref 135–145)
Total Bilirubin: 0.6 mg/dL (ref 0.3–1.2)
Total Protein: 7.9 g/dL (ref 6.5–8.1)

## 2022-09-13 LAB — CBC WITH DIFFERENTIAL (CANCER CENTER ONLY)
Abs Immature Granulocytes: 0.04 10*3/uL (ref 0.00–0.07)
Basophils Absolute: 0.1 10*3/uL (ref 0.0–0.1)
Basophils Relative: 1 %
Eosinophils Absolute: 0.1 10*3/uL (ref 0.0–0.5)
Eosinophils Relative: 1 %
HCT: 44.1 % (ref 36.0–46.0)
Hemoglobin: 14.3 g/dL (ref 12.0–15.0)
Immature Granulocytes: 0 %
Lymphocytes Relative: 40 %
Lymphs Abs: 3.7 10*3/uL (ref 0.7–4.0)
MCH: 27.6 pg (ref 26.0–34.0)
MCHC: 32.4 g/dL (ref 30.0–36.0)
MCV: 85.1 fL (ref 80.0–100.0)
Monocytes Absolute: 0.7 10*3/uL (ref 0.1–1.0)
Monocytes Relative: 7 %
Neutro Abs: 4.8 10*3/uL (ref 1.7–7.7)
Neutrophils Relative %: 51 %
Platelet Count: 258 10*3/uL (ref 150–400)
RBC: 5.18 MIL/uL — ABNORMAL HIGH (ref 3.87–5.11)
RDW: 14.5 % (ref 11.5–15.5)
WBC Count: 9.4 10*3/uL (ref 4.0–10.5)
nRBC: 0 % (ref 0.0–0.2)

## 2022-09-13 NOTE — Assessment & Plan Note (Addendum)
No nodule found on CT chest lung cancer screening.  Continue follow up with lung caner screening program.

## 2022-09-13 NOTE — Assessment & Plan Note (Addendum)
History of Stage IIA LEFT breast cancer Finished 5 years of endocrine therapy on 03/13/2020 [facility] testing on 08/20/2019 revealed no benefit from extended endocrine therapy Check CA 27.29  Continue annual mammogram-April 2025

## 2022-09-13 NOTE — Assessment & Plan Note (Addendum)
Bone density on 03/03/2020 revealed osteopenia with a T-score of -2.2 (improved).  Continue calcium and vitamin D. Repeat DEXA

## 2022-09-13 NOTE — Assessment & Plan Note (Addendum)
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 No T stage on pathology Status post resection.  Negative though close margin 0. 1 mm. CT scan was reviewed with patient. Stable w no recurrence.  Continue annual CT scan surveillance

## 2022-09-13 NOTE — Progress Notes (Signed)
Hematology Oncology Progress Note  Chief Complaint: Donna Riley is a 62 y.o. 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 No T stage on pathology Status post resection.  Negative though close margin 0. 1 mm. CT scan was reviewed with patient. Stable w no recurrence.  Continue annual CT scan surveillance  Osteopenia Bone density on 03/03/2020 revealed osteopenia with a T-score of -2.2 (improved).  Continue calcium and vitamin D. Repeat DEXA  Lung nodule No nodule found on CT chest lung cancer screening.  Continue follow up with lung caner screening program.    Orders Placed This Encounter  Procedures   DG Bone Density    Standing Status:   Future    Standing Expiration Date:   09/13/2023    Order Specific Question:   Reason for Exam (SYMPTOM  OR DIAGNOSIS REQUIRED)    Answer:   OSTEOPOROSIS; SURVEILLANCE    Order Specific Question:   Preferred imaging location?    Answer:   [facility]   MM 3D SCREENING MAMMOGRAM BILATERAL BREAST    Standing Status:   Future    Standing Expiration Date:   09/13/2023    Order Specific Question:   Reason for Exam (SYMPTOM  OR DIAGNOSIS REQUIRED)    Answer:   screening mammo    Order Specific Question:   Preferred imaging location?    Answer:   [facility]   CT Abdomen Pelvis W Contrast    Standing Status:   Future    Standing Expiration Date:   09/13/2023    Scheduling Instructions:     To be scheduled in April 2024 ( a few days prior to  seeing Dr. Cathie Hoops).    Order Specific Question:   If indicated for the ordered procedure, I authorize the administration of contrast media per Radiology protocol    Answer:   Yes    Order Specific Question:   Does the patient have a contrast media/X-ray dye allergy?    Answer:   No    Order Specific Question:   Preferred imaging location?    Answer:   [facility]    Order Specific Question:   Is Oral Contrast requested for this exam?    Answer:   Yes, Per Radiology protocol   Cancer antigen 27.29    Standing Status:   Future    Number of Occurrences:   1    Standing Expiration Date:   09/13/2023   CBC with Differential (Cancer Center Only)    Standing Status:   Future    Number of Occurrences:   1    Standing Expiration Date:   09/13/2023   CMP (Cancer Center only)    Standing Status:   Future    Number of Occurrences:   1    Standing Expiration Date:   09/13/2023   Chromogranin A    Standing Status:   Future    Number of Occurrences:   1    Standing Expiration Date:   09/13/2023   Cancer antigen 27.29    Standing Status:   Future  Standing Expiration Date:   09/13/2023   CMP (Cancer Center only)    Standing Status:   Future    Standing Expiration Date:   09/13/2023   CBC with Differential (Cancer Center Only)    Standing Status:   Future    Standing Expiration Date:   09/13/2023   Chromogranin A    Standing Status:   Future    Standing Expiration Date:   09/13/2023   Follow up 12 months.  All questions were answered. The patient knows to call the clinic with any problems, questions or concerns.  [name], MD, PhD [facility] Hematology Oncology 09/13/2022    PERTINENT ONCOLOGY HISTORY Patient previously followed up by Dr.Corcoran, patient switched care to me on 12/31/20 Extensive medical record review was performed by me  #stage IIA left breast cancer  08/27/2014 partial mastectomy and sentinel lymph node biopsy on 08/27/2014.  Pathology revealed a 0.8 cm grade II  invasive ductal carcinoma (biopsy specimen tumor size was 1 cm) with DCIS.  There was lymphovascular invasion.  One of 2 sentinel lymph nodes were positive  (focus of 2.8 mm).  Tumor was > 90% ER positive, > 90% PR positive, and Her2/neu negative.  Pathologic stage was T1bN1aM0.   Bone scan on 09/16/2014 revealed abnormal focal uptake at the level of right L3 pedicle and L5 vertebral body.  Lumbar spine MRI on 09/27/2014 revealed no evidence of metastatic disease with lower lumbar facet arthritis.  Abdominal and pelvic CT scan on 09/24/2014 revealed hepatomegaly and no evidence of metastatic disease.     Adjuvant chemotherapy  4 cycles of Taxotere and Cytoxan (09/29/2014 - 12/09/2014) with Neulasta support. She received 50.4 Gy to the left breast from 01/12/2015 until 02/23/2015.    03/12/2015, started on letrozole but switched to Arimidex on 03/30/2015 secondary to diffuse joint aches.   Arimidex completed around 03/13/2020.  BCI testing on 08/20/2019 revealed an estimated risk of late recurrence between years 5-10 of 10.1% (95% CI: 5.6-14.3%).  She is not likely to benefit from extended endocrine therapy.   She had chronic left nipple discharge.  Left breast lumpectomy at the 11 o'clock position on 04/05/2016 revealed an intraductal papilloma with sclerosis and calcifications. There was fat necrosis with fibrosis and calcifications. Pathology was negative for atypia and malignancy  Health maintenance Colonoscopy on 08/28/2015 revealed one 4 mm polyp in the rectum (tubular adenoma). EGD on 10/03/2019 revealed erythematous mucosa in the antrum. Pathology revealed antral and oxyntic mucosa with moderate chronic inactive gastritis. H. Pylori was negative. There was no intestinal metaplasia, dysplasia, and malignancy.   #Osteoporosis Bone density study on 09/15/2014 revealed osteopenia with a T-score of -2.1 at L1-L4.  Bone density on 01/17/2018 revealed osteoporosis with a T-score of -2.6 in the AP  spine L1-L4.  Bone density on 03/03/2020 revealed osteopenia with a T score of -2.2 at the lumbar spine. She is on calcium and vitamin D.   #Lung cancer screening  She has a smoking history.  Low dose chest CT on 02/05/2020 revealed Lung-RADS 2S, benign appearance or behavior.  There was aortic atherosclerosis, in addition to left anterior descending coronary artery disease. There was mild diffuse bronchial wall thickening with mild centrilobular and paraseptal emphysema suggestive of underlying COPD.  08/09/2020 colonoscopy by Dr. Maximino Greenland showed 5 polyp in ascending colon, descending colon and sigmoid colon resected and retrieved-pathology showed tubular adenoma, multiple fragments.  Negative for high-grade dysplasia and malignancy. Nodule in the sigmoid colon, biopsied.  Clip was placed.-Biopsy showed  well-differentiated neuroendocrine tumor.  Grade 1.  08/27/2020, flexible sigmoidoscopy showed mucosal nodule in the sigmoid colon, tattooed.  Nonbleeding internal hemorrhoids.    09/10/2020  Flex Sigmoidoscopy/Lower EUS by Dr. Meridee Score, and EMR  Pathology showed proximal rectum EMR Well differentiated neuroendocrine tumor-carcinoid.  Neuroendocrine tumor less 10.1 cm from cauterized margin. 1.2 x 1 x 0.8 cm.  Dr. Meridee Score felt to be a complete resection and recommend 1 year follow-up lower EUS. 09/30/2020 CT abdomen pelvis showed sutures near the rectosigmoid junction consistent with history.  Hepatic steatosis.  09/30/2020 Chromogranin A level at 49.6.  07/02/21 mammogram No mammographic evidence of malignancy. 11/11/2021, patient had repeat EUS/sigmoidoscopy for follow-up of the rectal NET.  Per patient, no abnormality was found.  EUS report was not available in EMR.  INTERVAL HISTORY Donna Riley is a 63 y.o. female who has above history reviewed by me today presents for follow up for history of breast cancer as well as rectal neuroendocrine carcinoma. She continues to have chronic  intermittent left breast/axillary area tenderness.  Right breast heaviness.   No new complaints or new breast concerns.  Denies cough, shortness of breath, abdominal pain, blood in the stool.      Past Medical History:  Diagnosis Date   Abdominal bloating    Acute midline low back pain without sciatica 10/09/2018   Arthritis    shoulders and neck   Breast cancer, left 08/04/2014   a.) Bx (+) for invasive mammary carcinoma with DCIS; ER/PR (+), HER2/neu (-). (+) lymphovascular invasion with 1 of 2 lymph nodes being (+). Treated with lumpectomy + adjuvant chemoradiation.   Breast wound, left, sequela 06/23/2016   Cancer of gastrointestinal tract 08/2020   COPD (chronic obstructive pulmonary disease)    no inhalers   Coronary artery disease    a.) TTE and MPI normal in 10/2020; EF > 55%.   Diastolic dysfunction 10/29/2020   a.)  TTE 10/29/2020: EF >55%; G1DD   DOE (dyspnea on exertion)    Elevated LFTs    Endometrial polyp 02/03/2021   a.) Pelvic US --> 18 x 5 x 10 mm lenticular nodule in endrometrial canal; favored endometrial polyp vs. neoplasm. b.) Bx 02/23/2021 --> no dysplasia or malignancy; benign.   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 IHC for cytokeratin AE1/AE3 and CD56 supports.   Osteoporosis of lumbar spine 03/12/2015   2021 - Prolia recommended by Oncology - to start once dental issues are resolved   Polyp of ascending colon    Polyp of descending colon    Polyp of sigmoid colon    Positive PPD, treated    Rectal polyp    T2DM (type 2 diabetes mellitus)  06/2014   Valvular regurgitation 10/29/2020   a.) TTE 10/29/2020: EF >55%; trivial AR/TR/PR, mild MR    Past Surgical History:  Procedure Laterality Date   BIOPSY  11/11/2021   Procedure: BIOPSY;  Surgeon: Lemar Lofty., MD;  Location: [facility] ENDOSCOPY;  Service: Gastroenterology;;   BREAST BIOPSY Left 07/31/2014  [facility] and DCIS    BREAST BIOPSY Left 03/08/2016   INTRADUCTAL PAPILLOMA    BREAST LUMPECTOMY Left 07/2014   [facility], DCIS, clear margins, positive LN   BREAST LUMPECTOMY Left 04/05/2016   excision of intraductal papilloma, no malignancy or atypia   BREAST LUMPECTOMY WITH NEEDLE LOCALIZATION Left 04/05/2016   Procedure: BREAST LUMPECTOMY WITH NEEDLE LOCALIZATION;  Surgeon: Gladis Riffle, MD;  Location: [facility] ORS;  Service: General;  Laterality: Left;   BREAST LUMPECTOMY WITH NEEDLE LOCALIZATION AND AXILLARY SENTINEL LYMPH NODE BX Left 08/27/14   CHOLECYSTECTOMY  09/07/2013   COLONOSCOPY WITH PROPOFOL N/A 08/28/2015   Procedure: COLONOSCOPY WITH PROPOFOL;  Surgeon: Midge Minium, MD;  Location: [facility]GERY [facility];  Service: Endoscopy;  Laterality: N/A;  Diabetic oral   COLONOSCOPY WITH PROPOFOL N/A 08/14/2020   Procedure: COLONOSCOPY WITH PROPOFOL;  Surgeon: Pasty Spillers, MD;  Location: [facility] ENDOSCOPY;  Service: Endoscopy;  Laterality: N/A;   ENDOSCOPIC MUCOSAL RESECTION N/A 09/10/2020   Procedure: ENDOSCOPIC MUCOSAL RESECTION;  Surgeon: Meridee Score Netty Starring., MD;  Location: [facility] ENDOSCOPY;  Service: Gastroenterology;  Laterality: N/A;   ESOPHAGOGASTRODUODENOSCOPY  2014   gastritis   ESOPHAGOGASTRODUODENOSCOPY (EGD) WITH PROPOFOL N/A 04/16/2019   Procedure: ESOPHAGOGASTRODUODENOSCOPY (EGD) WITH PROPOFOL;  Surgeon: Pasty Spillers, MD;  Location: [facility] ENDOSCOPY;  Service: Endoscopy;  Laterality: N/A;   ESOPHAGOGASTRODUODENOSCOPY (EGD) WITH PROPOFOL N/A 10/03/2019   Procedure: ESOPHAGOGASTRODUODENOSCOPY (EGD) WITH PROPOFOL;  Surgeon: Pasty Spillers, MD;  Location:  [facility] ENDOSCOPY;  Service: Endoscopy;  Laterality: N/A;   EUS N/A 09/10/2020   Procedure: LOWER ENDOSCOPIC ULTRASOUND (EUS);  Surgeon: Lemar Lofty., MD;  Location: [facility] ENDOSCOPY;  Service: Gastroenterology;  Laterality: N/A;   EUS N/A 11/11/2021   Procedure: LOWER ENDOSCOPIC ULTRASOUND (EUS);  Surgeon: Lemar Lofty., MD;  Location: [facility] ENDOSCOPY;  Service: Gastroenterology;  Laterality: N/A;   FLEXIBLE SIGMOIDOSCOPY N/A 08/27/2020   Procedure: FLEXIBLE SIGMOIDOSCOPY;  Surgeon: Pasty Spillers, MD;  Location: [facility] ENDOSCOPY;  Service: Endoscopy;  Laterality: N/A;   FLEXIBLE SIGMOIDOSCOPY N/A 09/10/2020   Procedure: FLEXIBLE SIGMOIDOSCOPY;  Surgeon: Meridee Score Netty Starring., MD;  Location: [facility] ENDOSCOPY;  Service: Gastroenterology;  Laterality: N/A;   FLEXIBLE SIGMOIDOSCOPY N/A 11/11/2021   Procedure: FLEXIBLE SIGMOIDOSCOPY;  Surgeon: Meridee Score Netty Starring., MD;  Location: [facility] ENDOSCOPY;  Service: Gastroenterology;  Laterality: N/A;   FOREIGN BODY REMOVAL  09/10/2020   Procedure: FOREIGN BODY REMOVAL ;  Surgeon: Meridee Score Netty Starring., MD;  Location: [facility] ENDOSCOPY;  Service: Gastroenterology;;   HEMOSTASIS CLIP PLACEMENT  09/10/2020   Procedure: HEMOSTASIS CLIP PLACEMENT;  Surgeon: Lemar Lofty., MD;  Location: [facility] ENDOSCOPY;  Service: Gastroenterology;;   HYSTEROSCOPY WITH D & C N/A 03/25/2021   Procedure: DILATATION AND CURETTAGE Melton Krebs;  Surgeon: Vena Austria, MD;  Location: [facility] ORS;  Service: Gynecology;  Laterality: N/A;   POLYPECTOMY  08/28/2015   Procedure: POLYPECTOMY INTESTINAL;  Surgeon: Midge Minium, MD;  Location: [facility]GERY [facility];  Service: Endoscopy;;  Rectal polyp   SUBMUCOSAL LIFTING INJECTION  09/10/2020   Procedure: SUBMUCOSAL LIFTING INJECTION;  Surgeon: Lemar Lofty., MD;  Location: [facility] ENDOSCOPY;  Service: Gastroenterology;;   TUBAL LIGATION Bilateral    UMBILICAL HERNIA REPAIR N/A 08/09/2016   Procedure: HERNIA REPAIR UMBILICAL  ADULT;  Surgeon: Henrene Dodge, MD;  Location: [facility] ORS;  Service: General;  Laterality: N/A;   VENTRAL HERNIA REPAIR N/A 04/10/2015   Procedure: HERNIA REPAIR VENTRAL ADULT;  Surgeon: Nadeen Landau, MD;  Location: [facility] ORS;  Service: General;  Laterality: N/A;  Family History  Problem Relation Age of Onset   Cancer Mother 5       Pancreatic   Cancer Father        Throat dx 61s   Healthy Sister    Cancer Sister 72       unk metastatic   Kidney cancer Sister 60   HIV Brother    Breast cancer Maternal Grandmother 59   Breast cancer Cousin        d. 67s    Social History:  reports that she has been smoking cigarettes. She has a 22.50 pack-year smoking history. She has never used smokeless tobacco. She reports current alcohol use of about 2.0 standard drinks of alcohol per week. She reports that she does not use drugs.  She has cut back on smoking to 6 cigarettes daily. She lost her husband in 05/2018.  She lives in [place]. The patient is alone today.  Allergies: No Known Allergies  Current Medications: Current Outpatient Medications  Medication Sig Dispense Refill   acetaminophen (TYLENOL) 500 MG tablet Take 1,000 mg by mouth every 6 (six) hours as needed for moderate pain.     albuterol (VENTOLIN HFA) 108 (90 Base) MCG/ACT inhaler INHALE 2 PUFFS INTO THE LUNGS EVERY 6 HOURS AS NEEDED FOR WHEEZING OR SHORTNESS OF BREATH 6.7 g 0   amLODipine (NORVASC) 10 MG tablet Take 1 tablet (10 mg total) by mouth daily. 90 tablet 1   aspirin EC 81 MG tablet Take 81 mg by mouth daily.     atorvastatin (LIPITOR) 10 MG tablet Take 1 tablet (10 mg total) by mouth at bedtime. 90 tablet 1   Blood Glucose Calibration (TRUE METRIX LEVEL 1) Low SOLN 1 each by In Vitro route daily as needed. 1 each 3   Blood Glucose Monitoring Suppl (TRUE METRIX METER) DEVI 1 each by Does not apply route daily. 100 Device 3   glucose blood test strip Test BID 100 each 12   irbesartan (AVAPRO) 300 MG tablet Take 1  tablet (300 mg total) by mouth daily. 90 tablet 1   metFORMIN (GLUCOPHAGE-XR) 500 MG 24 hr tablet TAKE ONE TABLET BY MOUTH EVERY MORNING WITH BREAKFAST 90 tablet 1   methocarbamol (ROBAXIN) 500 MG tablet Take 500 mg by mouth at bedtime.     Multiple Vitamins-Minerals (MULTIVITAMIN WITH MINERALS) tablet Take 1 tablet by mouth daily.     phenazopyridine (PYRIDIUM) 95 MG tablet Take 1 tablet (95 mg total) by mouth 3 (three) times daily as needed for pain. 6 tablet 0   triamterene-hydrochlorothiazide (MAXZIDE-25) 37.5-25 MG tablet Take 1 tablet by mouth daily. 90 tablet 1   No current facility-administered medications for this visit.   Review of Systems  Constitutional:  Negative for appetite change, chills, fatigue and fever.  HENT:   Negative for hearing loss and voice change.   Eyes:  Negative for eye problems.  Respiratory:  Negative for chest tightness and cough.   Cardiovascular:  Negative for chest pain.  Gastrointestinal:  Negative for abdominal distention, abdominal pain and blood in stool.  Endocrine: Negative for hot flashes.  Genitourinary:  Negative for difficulty urinating and frequency.   Musculoskeletal:  Negative for arthralgias.  Skin:  Negative for itching and rash.  Neurological:  Negative for extremity weakness.  Hematological:  Negative for adenopathy.  Psychiatric/Behavioral:  Negative for confusion.   Left breast and axilla tenderness, right breast heaviness.    Vitals Blood pressure (!) 142/67, pulse 95, temperature (!) 96.6 F (35.9  C), temperature source Tympanic, resp. rate 18, weight 204 lb 11.2 oz (92.9 kg), SpO2 94 %.  Performance status (ECOG): 1 Physical Exam Vitals and nursing note reviewed.  Constitutional:      General: She is not in acute distress.    Appearance: She is well-developed. She is not diaphoretic.  Eyes:     General: No scleral icterus.    Conjunctiva/sclera: Conjunctivae normal.  Cardiovascular:     Rate and Rhythm: Normal rate and  regular rhythm.  Pulmonary:     Effort: Pulmonary effort is normal.     Breath sounds: Normal breath sounds.  Abdominal:     General: Abdomen is flat.  Neurological:     Mental Status: She is alert and oriented to person, place, and time.  Psychiatric:        Mood and Affect: Mood normal.   Declined breast exam.   RADIOGRAPHIC STUDIES: I have personally reviewed the radiological images as listed and agreed with the findings in the report. MM 3D SCREEN BREAST BILATERAL  Result Date: 09/07/2022 CLINICAL DATA:  Screening. EXAM: DIGITAL SCREENING BILATERAL MAMMOGRAM WITH TOMOSYNTHESIS AND CAD TECHNIQUE: Bilateral screening digital craniocaudal and mediolateral oblique mammograms were obtained. Bilateral screening digital breast tomosynthesis was performed. The images were evaluated with computer-aided detection. COMPARISON:  Previous exam(s). ACR Breast Density Category b: There are scattered areas of fibroglandular density. FINDINGS: There are no findings suspicious for malignancy. IMPRESSION: No mammographic evidence of malignancy. A result letter of this screening mammogram will be mailed directly to the patient. RECOMMENDATION: Screening mammogram in one year. (Code:[id number]) BI-RADS CATEGORY  1: Negative. Electronically Signed   By: Sande Brothers M.D.   On: 09/07/2022 09:35   CT CHEST LCS NODULE F/U LOW DOSE WO CONTRAST  Result Date: 08/18/2022 CLINICAL DATA:  Current smoker with 34.5 pack-year history EXAM: CT CHEST WITHOUT CONTRAST FOR LUNG CANCER SCREENING NODULE FOLLOW-UP TECHNIQUE: Multidetector CT imaging of the chest was performed following the standard protocol without IV contrast. RADIATION DOSE REDUCTION: This exam was performed according to the departmental dose-optimization program which includes automated exposure control, adjustment of the mA and/or kV according to patient size and/or use of iterative reconstruction technique. COMPARISON:  Chest CT dated February 24, 2022  FINDINGS: Cardiovascular: Normal heart size. No pericardial effusion. Normal caliber thoracic aorta with moderate calcified plaque. No coronary artery calcifications. Mediastinum/Nodes: Esophagus and thyroid are unremarkable. No enlarged lymph nodes seen in the chest. Lungs/Pleura: Central airways are patent. Mild centrilobular emphysema. Subpleural reticulations of the anterior left upper lobe, likely postradiation change. Solid pulmonary nodules which were new on prior exam have resolved. Additional small nodules are stable. Reference solid nodule of the left upper lobe measuring 2.6 mm on image 63. Upper Abdomen: No acute abnormality. Musculoskeletal: No chest wall mass or suspicious bone lesions identified. IMPRESSION: 1. Lung-RADS 2, benign appearance or behavior. Continue annual screening with low-dose chest CT without contrast in 12 months. 2. Aortic Atherosclerosis (ICD10-I70.0) and Emphysema (ICD10-J43.9). Electronically Signed   By: Allegra Lai M.D.   On: 08/18/2022 16:11  CT Renal Stone Study  Result Date: 07/18/2022 CLINICAL DATA:  Flank pain EXAM: CT ABDOMEN AND PELVIS WITHOUT CONTRAST TECHNIQUE: Multidetector CT imaging of the abdomen and pelvis was performed following the standard protocol without IV contrast. RADIATION DOSE REDUCTION: This exam was performed according to the departmental dose-optimization program which includes automated exposure control, adjustment of the mA and/or kV according to patient size and/or use of iterative reconstruction technique. COMPARISON:  CT 07/08/2022  FINDINGS: Lower chest: No acute abnormality. Hepatobiliary: Hepatic steatosis. Status post cholecystectomy. No biliary dilatation Pancreas: Unremarkable. No pancreatic ductal dilatation or surrounding inflammatory changes. Spleen: Normal in size without focal abnormality. Adrenals/Urinary Tract: Adrenal glands are unremarkable. Kidneys are normal, without renal calculi, focal lesion, or hydronephrosis.  Bladder is unremarkable. Stomach/Bowel: Stomach is within normal limits. Appendix appears normal. No evidence of bowel wall thickening, distention, or inflammatory changes. Vascular/Lymphatic: Mild aortic atherosclerosis. No aneurysm. No suspicious lymph nodes. Reproductive: Uterus and bilateral adnexa are unremarkable. Other: No abdominal wall hernia or abnormality. No abdominopelvic ascites. Musculoskeletal: No acute or significant osseous findings. IMPRESSION: 1. No CT evidence for acute intra-abdominal or pelvic abnormality. Hepatic steatosis. 2. Aortic atherosclerosis. Aortic Atherosclerosis (ICD10-I70.0). Electronically Signed   By: Jasmine Pang M.D.   On: 07/18/2022 19:18   CT ABDOMEN PELVIS W CONTRAST  Result Date: 07/08/2022 CLINICAL DATA:  Carcinoid tumor of the rectum. Restaging. * Tracking Code: BO * EXAM: CT ABDOMEN AND PELVIS WITH CONTRAST TECHNIQUE: Multidetector CT imaging of the abdomen and pelvis was performed using the standard protocol following bolus administration of intravenous contrast. RADIATION DOSE REDUCTION: This exam was performed according to the departmental dose-optimization program which includes automated exposure control, adjustment of the mA and/or kV according to patient size and/or use of iterative reconstruction technique. CONTRAST:  OMNIPAQUE IOHEXOL 300 MG/ML  SOLN COMPARISON:  CT AP 09/30/2020 FINDINGS: Lower chest: No acute abnormality. Hepatobiliary: No suspicious liver abnormality. Gallbladder appears surgically absent. No bile duct dilatation. Pancreas: Unremarkable. No pancreatic ductal dilatation or surrounding inflammatory changes. Spleen: Normal in size without focal abnormality. Adrenals/Urinary Tract: Normal adrenal glands. No kidney mass, nephrolithiasis, or hydronephrosis. Urinary bladder is unremarkable. Stomach/Bowel: Stomach appears normal. The appendix is visualized and appears normal. No bowel wall thickening, inflammation or distension. No colonic  mass identified. Vascular/Lymphatic: Aortic atherosclerosis. No signs of abdominopelvic adenopathy. Reproductive: Uterus and bilateral adnexa are unremarkable. Other: No ascites or focal fluid collections identified. Musculoskeletal: No acute or significant osseous findings. IMPRESSION: 1. No acute findings within the abdomen or pelvis. No specific findings identified to suggest residual or recurrent tumor or metastatic disease. 2.  Aortic Atherosclerosis (ICD10-I70.0). Electronically Signed   By: Signa Kell M.D.   On: 07/08/2022 16:58     Labs are reviewed and discussed with patient.    Latest Ref Rng & Units 09/13/2022   10:14 AM 07/18/2022    4:47 PM 06/29/2022    8:44 AM  CBC  WBC 4.0 - 10.5 K/uL 9.4  8.0  7.8   Hemoglobin 12.0 - 15.0 g/dL 25.9  56.3  87.5   Hematocrit 36.0 - 46.0 % 44.1  44.6  44.0   Platelets 150 - 400 K/uL 258  212  241       Latest Ref Rng & Units 09/13/2022   10:14 AM 07/18/2022    4:47 PM 06/29/2022    8:44 AM  CMP  Glucose 70 - 99 mg/dL 643  329  518   BUN 8 - 23 mg/dL 20  13  9    Creatinine 0.44 - 1.00 mg/dL 8.41  6.60  6.30   Sodium 135 - 145 mmol/L 135  134  140   Potassium 3.5 - 5.1 mmol/L 3.8  3.5  3.7   Chloride 98 - 111 mmol/L 103  102  105   CO2 22 - 32 mmol/L 23  22  24    Calcium 8.9 - 10.3 mg/dL 9.3  8.7  8.9   Total Protein 6.5 -  8.1 g/dL 7.9  7.6  7.5   Total Bilirubin 0.3 - 1.2 mg/dL 0.6  0.4  0.3   Alkaline Phos 38 - 126 U/L 93  111  84   AST 15 - 41 U/L 26  69  26   ALT 0 - 44 U/L 24  68  28

## 2022-09-14 LAB — CANCER ANTIGEN 27.29: CA 27.29: 9.7 U/mL (ref 0.0–38.6)

## 2022-09-15 LAB — CHROMOGRANIN A: Chromogranin A (ng/mL): 102.6 ng/mL — ABNORMAL HIGH (ref 0.0–101.8)

## 2022-10-17 ENCOUNTER — Other Ambulatory Visit: Payer: Self-pay | Admitting: Internal Medicine

## 2022-10-17 DIAGNOSIS — E118 Type 2 diabetes mellitus with unspecified complications: Secondary | ICD-10-CM

## 2022-10-17 DIAGNOSIS — I10 Essential (primary) hypertension: Secondary | ICD-10-CM

## 2022-10-19 ENCOUNTER — Telehealth: Payer: Self-pay | Admitting: Internal Medicine

## 2022-10-19 NOTE — Telephone Encounter (Signed)
Copied from CRM [id number]. Topic: Medicare AWV >> Oct 19, 2022  1:54 PM Payton Doughty wrote: Reason for CRM: Called patient to schedule Medicare Annual Wellness Visit (AWV). Left message for patient to call back and schedule Medicare Annual Wellness Visit (AWV).  Last date of AWV: 11/03/21  Please schedule an appointment at any time with Kennedy Bucker, LPN  .  If any questions, please contact me.  Thank you ,  Verlee Rossetti; Care Guide Ambulatory Clinical Support [facility] Direct Dial: [phone]

## 2022-11-01 ENCOUNTER — Ambulatory Visit
Admission: RE | Admit: 2022-11-01 | Discharge: 2022-11-01 | Disposition: A | Discharge status: Home / Self Care | Payer: 59 | Source: Ambulatory Visit | Attending: Oncology | Admitting: Oncology

## 2022-11-01 DIAGNOSIS — M8589 Other specified disorders of bone density and structure, multiple sites: Secondary | ICD-10-CM | POA: Diagnosis not present

## 2022-11-01 DIAGNOSIS — M8588 Other specified disorders of bone density and structure, other site: Secondary | ICD-10-CM | POA: Diagnosis not present

## 2022-11-01 DIAGNOSIS — Z170 Estrogen receptor positive status [ER+]: Secondary | ICD-10-CM | POA: Diagnosis not present

## 2022-11-01 DIAGNOSIS — C50912 Malignant neoplasm of unspecified site of left female breast: Secondary | ICD-10-CM | POA: Insufficient documentation

## 2022-11-06 NOTE — Progress Notes (Unsigned)
PCP: Donna Milan, MD   No chief complaint on file.   HPI:      [name] is a 62 y.o. No obstetric history on file. whose LMP was No LMP recorded. Patient is postmenopausal., presents today for her annual examination.  Her menses are absent due to menopause. S/p hyst D&C 10/22 for PMB due to endometrial polyp She {does:18564} have vasomotor sx.   Sex activity: {sex active: 315163}. She {does:18564} have vaginal dryness.  Last Pap: 02/23/21 Results were: no abnormalities /neg HPV DNA.  Hx of STDs: {STD hx:14358}  Last mammogram: 09/06/22 Results were: normal--routine follow-up in 12 months There is no FH of breast cancer. There is no FH of ovarian cancer. The patient {does:18564} do self-breast exams. Hx of LT breast ER+ cancer, followed by oncolgy. Did lumpectomy with radiation/chemo and finished endocrine therapy. Also with hx of neuroendocrine carcinoma of rectum. Pt is [employer] neg.  Colonoscopy: {hx:15363}  Repeat due after 10*** years.   Tobacco use: {tob:20664} Alcohol use: {Alcohol:11675} No drug use Exercise: {exercise:31265}  She {does:18564} get adequate calcium and Vitamin D in her diet.  Labs with PCP.   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   Lumbosacral spondylosis without myelopathy 11/22/2018   Vertigo 10/09/2018   Hyperlipidemia associated with type  2 diabetes mellitus (HCC) 04/11/2018   Gastroparesis 04/11/2018   Long term (current) use of aromatase inhibitors 12/25/2017   Arthritis    Elevated LFTs    Essential hypertension    Positive PPD, treated    Umbilical hernia without obstruction and without gangrene 07/13/2016   Irritable bowel syndrome with both constipation and diarrhea 07/20/2015   Type II diabetes mellitus with complication (HCC) 06/22/2015   Pre-ulcerative calluses 06/22/2015   Neuropathy 06/22/2015   Hot flash, menopausal 04/09/2015   Atherosclerotic peripheral vascular disease with intermittent claudication (HCC) 03/25/2015   Malignant neoplasm of left breast in female, estrogen receptor positive (HCC) 05/30/2014   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    Past Surgical History:  Procedure Laterality Date   BIOPSY  11/11/2021   Procedure: BIOPSY;  Surgeon: Lemar Lofty., MD;  Location: [facility] ENDOSCOPY;  Service: Gastroenterology;;   BREAST BIOPSY Left 07/31/2014   IMC and DCIS    BREAST BIOPSY Left 03/08/2016   INTRADUCTAL PAPILLOMA    BREAST LUMPECTOMY Left 07/2014   IDC, DCIS, clear margins, positive LN   BREAST LUMPECTOMY Left 04/05/2016   excision of intraductal papilloma, no malignancy or atypia   BREAST LUMPECTOMY WITH NEEDLE LOCALIZATION Left 04/05/2016   Procedure: BREAST LUMPECTOMY WITH NEEDLE LOCALIZATION;  Surgeon: Gladis Riffle, MD;  Location: [facility] ORS;  Service: General;  Laterality: Left;   BREAST LUMPECTOMY WITH NEEDLE LOCALIZATION AND AXILLARY SENTINEL LYMPH NODE BX Left 08/27/14   CHOLECYSTECTOMY  09/07/2013   COLONOSCOPY WITH PROPOFOL N/A 08/28/2015   Procedure: COLONOSCOPY WITH PROPOFOL;  Surgeon: Midge Minium, MD;  Location: [facility];  Service: Endoscopy;  Laterality: N/A;  Diabetic oral   COLONOSCOPY WITH PROPOFOL N/A 08/14/2020   Procedure: COLONOSCOPY WITH PROPOFOL;  Surgeon: Pasty Spillers, MD;  Location: [facility] ENDOSCOPY;  Service: Endoscopy;  Laterality: N/A;    ENDOSCOPIC MUCOSAL RESECTION N/A 09/10/2020   Procedure: ENDOSCOPIC MUCOSAL RESECTION;  Surgeon: Meridee Score Netty Starring., MD;  Location: [facility] ENDOSCOPY;  Service: Gastroenterology;  Laterality: N/A;   ESOPHAGOGASTRODUODENOSCOPY  2014   gastritis   ESOPHAGOGASTRODUODENOSCOPY (EGD) WITH PROPOFOL N/A 04/16/2019   Procedure: ESOPHAGOGASTRODUODENOSCOPY (EGD) WITH PROPOFOL;  Surgeon: Pasty Spillers, MD;  Location: [facility] ENDOSCOPY;  Service: Endoscopy;  Laterality: N/A;   ESOPHAGOGASTRODUODENOSCOPY (EGD) WITH PROPOFOL N/A 10/03/2019   Procedure: ESOPHAGOGASTRODUODENOSCOPY (EGD) WITH PROPOFOL;  Surgeon: Pasty Spillers, MD;  Location: [facility] ENDOSCOPY;  Service: Endoscopy;  Laterality: N/A;   EUS N/A 09/10/2020   Procedure: LOWER ENDOSCOPIC ULTRASOUND (EUS);  Surgeon: Lemar Lofty., MD;  Location: [facility] ENDOSCOPY;  Service: Gastroenterology;  Laterality: N/A;   EUS N/A 11/11/2021   Procedure: LOWER ENDOSCOPIC ULTRASOUND (EUS);  Surgeon: Lemar Lofty., MD;  Location: [facility] ENDOSCOPY;  Service: Gastroenterology;  Laterality: N/A;   FLEXIBLE SIGMOIDOSCOPY N/A 08/27/2020   Procedure: FLEXIBLE SIGMOIDOSCOPY;  Surgeon: Pasty Spillers, MD;  Location: [facility] ENDOSCOPY;  Service: Endoscopy;  Laterality: N/A;   FLEXIBLE SIGMOIDOSCOPY N/A 09/10/2020   Procedure: FLEXIBLE SIGMOIDOSCOPY;  Surgeon: Meridee Score Netty Starring., MD;  Location: [facility] ENDOSCOPY;  Service: Gastroenterology;  Laterality: N/A;   FLEXIBLE SIGMOIDOSCOPY N/A 11/11/2021   Procedure: FLEXIBLE SIGMOIDOSCOPY;  Surgeon: Meridee Score Netty Starring., MD;  Location: [facility] ENDOSCOPY;  Service: Gastroenterology;  Laterality: N/A;   FOREIGN BODY REMOVAL  09/10/2020   Procedure: FOREIGN BODY REMOVAL ;  Surgeon: Meridee Score Netty Starring., MD;  Location: [facility] ENDOSCOPY;  Service: Gastroenterology;;   HEMOSTASIS CLIP PLACEMENT  09/10/2020   Procedure: HEMOSTASIS CLIP PLACEMENT;  Surgeon: Lemar Lofty., MD;  Location: [facility] ENDOSCOPY;  Service:  Gastroenterology;;   HYSTEROSCOPY WITH D & C N/A 03/25/2021   Procedure: DILATATION AND CURETTAGE Melton Krebs;  Surgeon: Vena Austria, MD;  Location: [facility] ORS;  Service: Gynecology;  Laterality: N/A;   POLYPECTOMY  08/28/2015   Procedure: POLYPECTOMY INTESTINAL;  Surgeon: Midge Minium, MD;  Location: [facility];  Service: Endoscopy;;  Rectal polyp   SUBMUCOSAL LIFTING INJECTION  09/10/2020   Procedure: SUBMUCOSAL LIFTING INJECTION;  Surgeon: Lemar Lofty., MD;  Location: [facility] ENDOSCOPY;  Service: Gastroenterology;;   TUBAL LIGATION Bilateral    UMBILICAL HERNIA REPAIR N/A 08/09/2016   Procedure: HERNIA REPAIR UMBILICAL ADULT;  Surgeon: Henrene Dodge, MD;  Location: [facility] ORS;  Service: General;  Laterality: N/A;   VENTRAL HERNIA REPAIR N/A 04/10/2015   Procedure: HERNIA REPAIR VENTRAL ADULT;  Surgeon: Nadeen Landau, MD;  Location: [facility] ORS;  Service: General;  Laterality: N/A;    Family History  Problem Relation Age of Onset   Cancer Mother 72       Pancreatic   Cancer Father        Throat dx 51s   Healthy Sister    Cancer Sister 29       unk metastatic   Kidney cancer Sister 66   HIV Brother    Breast cancer Maternal Grandmother 76   Breast cancer Cousin        d. 75s    Social History   Socioeconomic History   Marital status: Widowed    Spouse name: Not on file   Number of children:  4   Years of education: Not on file   Highest education level: 10th grade  Occupational History   Occupation: Disabled  Tobacco Use   Smoking status: Every Day    Packs/day: 0.50    Years: 45.00    Additional pack years: 0.00    Total pack years: 22.50    Types: Cigarettes   Smokeless tobacco: Never   Tobacco comments:    Smokes 7 ciggs daily.  Vaping Use   Vaping Use: Never used  Substance and Sexual Activity   Alcohol use: Yes    Alcohol/week: 2.0 standard drinks of alcohol    Types: 2 Cans of beer per week    Comment: weekends - beer    Drug use: No    Sexual activity: Not Currently  Other Topics Concern   Not on file  Social History Narrative   Pt lives alone, son lives 2 doors down.   Social Determinants of Health   Financial Resource Strain: Medium Risk (06/27/2022)   Overall Financial Resource Strain (CARDIA)    Difficulty of Paying Living Expenses: Somewhat hard  Food Insecurity: No Food Insecurity (06/27/2022)   Hunger Vital Sign    Worried About Running Out of Food in the Last Year: Never true    Ran Out of Food in the Last Year: Never true  Transportation Needs: No Transportation Needs (03/17/2022)   PRAPARE - Administrator, Civil Service (Medical): No    Lack of Transportation (Non-Medical): No  Physical Activity: Inactive (06/27/2022)   Exercise Vital Sign    Days of Exercise per Week: 0 days    Minutes of Exercise per Session: 0 min  Stress: Stress Concern Present (06/27/2022)   [employer] of Occupational Health - Occupational Stress Questionnaire    Feeling of Stress : To some extent  Social Connections: Socially Isolated (06/27/2022)   Social Connection and Isolation Panel [NHANES]    Frequency of Communication with Friends and Family: Once a week    Frequency of Social Gatherings with Friends and Family: Once a week    Attends Religious Services: Never    Database administrator or Organizations: No    Attends Banker Meetings: Never    Marital Status: Widowed  Intimate Partner Violence: Not At Risk (06/27/2022)   Humiliation, Afraid, Rape, and Kick questionnaire    Fear of Current or Ex-Partner: No    Emotionally Abused: No    Physically Abused: No    Sexually Abused: No     Current Outpatient Medications:    acetaminophen (TYLENOL) 500 MG tablet, Take 1,000 mg by mouth every 6 (six) hours as needed for moderate pain., Disp: , Rfl:    albuterol (VENTOLIN HFA) 108 (90 Base) MCG/ACT inhaler, INHALE 2 PUFFS INTO THE LUNGS EVERY 6 HOURS AS NEEDED FOR WHEEZING OR SHORTNESS OF BREATH,  Disp: 6.7 g, Rfl: 0   amLODipine (NORVASC) 10 MG tablet, TAKE 1 TABLET(10 MG) BY MOUTH DAILY, Disp: 90 tablet, Rfl: 1   aspirin EC 81 MG tablet, Take 81 mg by mouth daily., Disp: , Rfl:    atorvastatin (LIPITOR) 10 MG tablet, Take 1 tablet (10 mg total) by mouth at bedtime., Disp: 90 tablet, Rfl: 1   Blood Glucose Calibration (TRUE METRIX LEVEL 1) Low SOLN, 1 each by In Vitro route daily as needed., Disp: 1 each, Rfl: 3   Blood Glucose Monitoring Suppl (TRUE METRIX METER) DEVI, 1 each by Does not apply route daily., Disp: 100 Device,  Rfl: 3   glucose blood test strip, Test BID, Disp: 100 each, Rfl: 12   irbesartan (AVAPRO) 300 MG tablet, TAKE 1 TABLET(300 MG) BY MOUTH DAILY, Disp: 90 tablet, Rfl: 1   metFORMIN (GLUCOPHAGE-XR) 500 MG 24 hr tablet, TAKE 1 TABLET BY MOUTH EVERY MORNING WITH BREAKFAST, Disp: 90 tablet, Rfl: 1   methocarbamol (ROBAXIN) 500 MG tablet, Take 500 mg by mouth at bedtime., Disp: , Rfl:    Multiple Vitamins-Minerals (MULTIVITAMIN WITH MINERALS) tablet, Take 1 tablet by mouth daily., Disp: , Rfl:    phenazopyridine (PYRIDIUM) 95 MG tablet, Take 1 tablet (95 mg total) by mouth 3 (three) times daily as needed for pain., Disp: 6 tablet, Rfl: 0   triamterene-hydrochlorothiazide (MAXZIDE-25) 37.5-25 MG tablet, TAKE 1 TABLET BY MOUTH DAILY, Disp: 90 tablet, Rfl: 1     ROS:  Review of Systems BREAST: No symptoms    Objective: There were no vitals taken for this visit.   OBGyn Exam  Results: No results found for this or any previous visit (from the past 24 hour(s)).  Assessment/Plan:  No diagnosis found.   No orders of the defined types were placed in this encounter.           GYN counsel {counseling: 16159}    F/U  No follow-ups on file.  Kolbey Teichert B. Kierstynn Babich, PA-C 11/06/2022 6:19 PM

## 2022-11-07 ENCOUNTER — Encounter: Payer: Self-pay | Admitting: Obstetrics and Gynecology

## 2022-11-07 ENCOUNTER — Other Ambulatory Visit (HOSPITAL_COMMUNITY)
Admission: RE | Admit: 2022-11-07 | Discharge: 2022-11-07 | Disposition: A | Discharge status: Home / Self Care | Payer: 59 | Source: Ambulatory Visit | Attending: Obstetrics and Gynecology | Admitting: Obstetrics and Gynecology

## 2022-11-07 ENCOUNTER — Ambulatory Visit (INDEPENDENT_AMBULATORY_CARE_PROVIDER_SITE_OTHER): Payer: 59 | Admitting: Obstetrics and Gynecology

## 2022-11-07 VITALS — BP 104/66 | HR 96 | Ht 68.0 in | Wt 202.7 lb

## 2022-11-07 DIAGNOSIS — Z124 Encounter for screening for malignant neoplasm of cervix: Secondary | ICD-10-CM

## 2022-11-07 DIAGNOSIS — Z87410 Personal history of cervical dysplasia: Secondary | ICD-10-CM

## 2022-11-07 DIAGNOSIS — Z853 Personal history of malignant neoplasm of breast: Secondary | ICD-10-CM

## 2022-11-07 DIAGNOSIS — R1032 Left lower quadrant pain: Secondary | ICD-10-CM

## 2022-11-07 DIAGNOSIS — Z85048 Personal history of other malignant neoplasm of rectum, rectosigmoid junction, and anus: Secondary | ICD-10-CM | POA: Insufficient documentation

## 2022-11-07 DIAGNOSIS — Z01419 Encounter for gynecological examination (general) (routine) without abnormal findings: Secondary | ICD-10-CM | POA: Diagnosis not present

## 2022-11-07 DIAGNOSIS — Z1231 Encounter for screening mammogram for malignant neoplasm of breast: Secondary | ICD-10-CM

## 2022-11-07 NOTE — Patient Instructions (Signed)
I value your feedback and you entrusting us with your care. If you get a [facility] patient survey, I would appreciate you taking the time to let us know about your experience today. Thank you! ? ? ?

## 2022-11-07 NOTE — Addendum Note (Signed)
Addended by: Althea Grimmer B on: 11/07/2022 02:35 PM   Modules accepted: Orders

## 2022-11-08 ENCOUNTER — Ambulatory Visit (INDEPENDENT_AMBULATORY_CARE_PROVIDER_SITE_OTHER): Payer: 59

## 2022-11-08 ENCOUNTER — Telehealth: Payer: Self-pay | Admitting: Obstetrics and Gynecology

## 2022-11-08 DIAGNOSIS — R1032 Left lower quadrant pain: Secondary | ICD-10-CM | POA: Diagnosis not present

## 2022-11-08 NOTE — Telephone Encounter (Signed)
Pt aware of neg GYN u/s for LLQ pain. Recommended she f/u with GI due to sx and cancer hx. Pt understands.

## 2022-11-09 LAB — CYTOLOGY - PAP: Diagnosis: NEGATIVE

## 2022-11-10 ENCOUNTER — Ambulatory Visit (INDEPENDENT_AMBULATORY_CARE_PROVIDER_SITE_OTHER): Payer: 59

## 2022-11-10 VITALS — Ht 68.0 in | Wt 202.0 lb

## 2022-11-10 DIAGNOSIS — Z0000 Encounter for general adult medical examination without abnormal findings: Secondary | ICD-10-CM | POA: Diagnosis not present

## 2022-11-10 NOTE — Patient Instructions (Signed)
[name] , Thank you for taking time to come for your Medicare Wellness Visit. I appreciate your ongoing commitment to your health goals. Please review the following plan we discussed and let me know if I can assist you in the future.   These are the goals we discussed:  Goals      DIET - EAT MORE FRUITS AND VEGETABLES     Exercise 150 min/wk Moderate Activity     Recommend to exercise at least 150 minutes per week      Quit smoking / using tobacco     If you wish to quit smoking, help is available. For free tobacco cessation program offerings call the [facility] at [phone])280-1209 or Live Well Line at 301-470-4270. You may also visit [email or web address] or email [email or web address] [email or web address] for more information on other programs.          This is a list of the screening recommended for you and due dates:  Health Maintenance  Topic Date Due   DTaP/Tdap/Td vaccine (1 - Tdap) Never done   COVID-19 Vaccine (3 - Pfizer risk series) 12/11/2019   Flu Shot  12/29/2022   Hemoglobin A1C  01/18/2023   Eye exam for diabetics  01/27/2023   Yearly kidney health urinalysis for diabetes  07/21/2023   Complete foot exam   07/21/2023   Screening for Lung Cancer  08/17/2023   Mammogram  09/06/2023   Yearly kidney function blood test for diabetes  09/13/2023   Medicare Annual Wellness Visit  11/10/2023   Colon Cancer Screening  08/14/2025   Pap Smear  11/07/2027   DEXA scan (bone density measurement)  10/31/2032   Pneumococcal Vaccination  Completed   HIV Screening  Completed   Zoster (Shingles) Vaccine  Completed   Hepatitis C Screening  Addressed   HPV Vaccine  Aged Out    Advanced directives: no  Conditions/risks identified: none  Next appointment: Follow up in one year for your annual wellness visit. 11/15/23 @ 2:30 pm by phone  Preventive Care 40-64 Years, Female Preventive care refers to lifestyle choices and visits with your health care provider that can promote  health and wellness. What does preventive care include? A yearly physical exam. This is also called an annual well check. Dental exams once or twice a year. Routine eye exams. Ask your health care provider how often you should have your eyes checked. Personal lifestyle choices, including: Daily care of your teeth and gums. Regular physical activity. Eating a healthy diet. Avoiding tobacco and drug use. Limiting alcohol use. Practicing safe sex. Taking low-dose aspirin daily starting at age 31. Taking vitamin and mineral supplements as recommended by your health care provider. What happens during an annual well check? The services and screenings done by your health care provider during your annual well check will depend on your age, overall health, lifestyle risk factors, and family history of disease. Counseling  Your health care provider may ask you questions about your: Alcohol use. Tobacco use. Drug use. Emotional well-being. Home and relationship well-being. Sexual activity. Eating habits. Work and work Astronomer. Method of birth control. Menstrual cycle. Pregnancy history. Screening  You may have the following tests or measurements: Height, weight, and BMI. Blood pressure. Lipid and cholesterol levels. These may be checked every 5 years, or more frequently if you are over 45 years old. Skin check. Lung cancer screening. You may have this screening every year starting at age 64 if you have a 30-pack-year history of  smoking and currently smoke or have quit within the past 15 years. Fecal occult blood test (FOBT) of the stool. You may have this test every year starting at age 27. Flexible sigmoidoscopy or colonoscopy. You may have a sigmoidoscopy every 5 years or a colonoscopy every 10 years starting at age 14. Hepatitis C blood test. Hepatitis B blood test. Sexually transmitted disease (STD) testing. Diabetes screening. This is done by checking your blood sugar (glucose)  after you have not eaten for a while (fasting). You may have this done every 1-3 years. Mammogram. This may be done every 1-2 years. Talk to your health care provider about when you should start having regular mammograms. This may depend on whether you have a family history of breast cancer. BRCA-related cancer screening. This may be done if you have a family history of breast, ovarian, tubal, or peritoneal cancers. Pelvic exam and Pap test. This may be done every 3 years starting at age 6. Starting at age 97, this may be done every 5 years if you have a Pap test in combination with an HPV test. Bone density scan. This is done to screen for osteoporosis. You may have this scan if you are at high risk for osteoporosis. Discuss your test results, treatment options, and if necessary, the need for more tests with your health care provider. Vaccines  Your health care provider may recommend certain vaccines, such as: Influenza vaccine. This is recommended every year. Tetanus, diphtheria, and acellular pertussis (Tdap, Td) vaccine. You may need a Td booster every 10 years. Zoster vaccine. You may need this after age 48. Pneumococcal 13-valent conjugate (PCV13) vaccine. You may need this if you have certain conditions and were not previously vaccinated. Pneumococcal polysaccharide (PPSV23) vaccine. You may need one or two doses if you smoke cigarettes or if you have certain conditions. Talk to your health care provider about which screenings and vaccines you need and how often you need them. This information is not intended to replace advice given to you by your health care provider. Make sure you discuss any questions you have with your health care provider. Document Released: 06/12/2015 Document Revised: 02/03/2016 Document Reviewed: 03/17/2015 Elsevier Interactive Patient Education  2017 [employer].    Fall Prevention in the Home Falls can cause injuries. They can happen to people of all ages.  There are many things you can do to make your home safe and to help prevent falls. What can I do on the outside of my home? Regularly fix the edges of walkways and driveways and fix any cracks. Remove anything that might make you trip as you walk through a door, such as a raised step or threshold. Trim any bushes or trees on the path to your home. Use bright outdoor lighting. Clear any walking paths of anything that might make someone trip, such as rocks or tools. Regularly check to see if handrails are loose or broken. Make sure that both sides of any steps have handrails. Any raised decks and porches should have guardrails on the edges. Have any leaves, snow, or ice cleared regularly. Use sand or salt on walking paths during winter. Clean up any spills in your garage right away. This includes oil or grease spills. What can I do in the bathroom? Use night lights. Install grab bars by the toilet and in the tub and shower. Do not use towel bars as grab bars. Use non-skid mats or decals in the tub or shower. If you need to sit  down in the shower, use a plastic, non-slip stool. Keep the floor dry. Clean up any water that spills on the floor as soon as it happens. Remove soap buildup in the tub or shower regularly. Attach bath mats securely with double-sided non-slip rug tape. Do not have throw rugs and other things on the floor that can make you trip. What can I do in the bedroom? Use night lights. Make sure that you have a light by your bed that is easy to reach. Do not use any sheets or blankets that are too big for your bed. They should not hang down onto the floor. Have a firm chair that has side arms. You can use this for support while you get dressed. Do not have throw rugs and other things on the floor that can make you trip. What can I do in the kitchen? Clean up any spills right away. Avoid walking on wet floors. Keep items that you use a lot in easy-to-reach places. If you need  to reach something above you, use a strong step stool that has a grab bar. Keep electrical cords out of the way. Do not use floor polish or wax that makes floors slippery. If you must use wax, use non-skid floor wax. Do not have throw rugs and other things on the floor that can make you trip. What can I do with my stairs? Do not leave any items on the stairs. Make sure that there are handrails on both sides of the stairs and use them. Fix handrails that are broken or loose. Make sure that handrails are as long as the stairways. Check any carpeting to make sure that it is firmly attached to the stairs. Fix any carpet that is loose or worn. Avoid having throw rugs at the top or bottom of the stairs. If you do have throw rugs, attach them to the floor with carpet tape. Make sure that you have a light switch at the top of the stairs and the bottom of the stairs. If you do not have them, ask someone to add them for you. What else can I do to help prevent falls? Wear shoes that: Do not have high heels. Have rubber bottoms. Are comfortable and fit you well. Are closed at the toe. Do not wear sandals. If you use a stepladder: Make sure that it is fully opened. Do not climb a closed stepladder. Make sure that both sides of the stepladder are locked into place. Ask someone to hold it for you, if possible. Clearly mark and make sure that you can see: Any grab bars or handrails. First and last steps. Where the edge of each step is. Use tools that help you move around (mobility aids) if they are needed. These include: Canes. Walkers. Scooters. Crutches. Turn on the lights when you go into a dark area. Replace any light bulbs as soon as they burn out. Set up your furniture so you have a clear path. Avoid moving your furniture around. If any of your floors are uneven, fix them. If there are any pets around you, be aware of where they are. Review your medicines with your doctor. Some medicines can  make you feel dizzy. This can increase your chance of falling. Ask your doctor what other things that you can do to help prevent falls. This information is not intended to replace advice given to you by your health care provider. Make sure you discuss any questions you have with your health care provider.  Document Released: 03/12/2009 Document Revised: 10/22/2015 Document Reviewed: 06/20/2014 Elsevier Interactive Patient Education  2017 [employer].

## 2022-11-10 NOTE — Progress Notes (Signed)
I connected with  Barbaraann Barthel on 11/10/22 by a audio enabled telemedicine application and verified that I am speaking with the correct person using two identifiers.  Patient Location: Home  Provider Location: Office/Clinic  I discussed the limitations of evaluation and management by telemedicine. The patient expressed understanding and agreed to proceed.  Subjective:   Donna Riley is a 62 y.o. female who presents for Medicare Annual (Subsequent) preventive examination.  Review of Systems     Cardiac Risk Factors include: advanced age (>54men, >30 women);dyslipidemia;diabetes mellitus;hypertension     Objective:    Today's Vitals   11/10/22 0832  Weight: 202 lb (91.6 kg)  Height: 5\' 8"  (1.727 m)   Body mass index is 30.71 kg/m.     11/10/2022    8:23 AM 09/13/2022    9:50 AM 07/18/2022    4:44 PM 03/07/2022    9:20 AM 11/11/2021    7:57 AM 09/06/2021   10:23 AM 05/06/2021    9:12 AM  Advanced Directives  Does Patient Have a Medical Advance Directive? No No No No No No No  Would patient like information on creating a medical advance directive? No - Patient declined No - Patient declined  Yes (ED - Information included in AVS) No - Patient declined      Current Medications (verified) Outpatient Encounter Medications as of 11/10/2022  Medication Sig   acetaminophen (TYLENOL) 500 MG tablet Take 1,000 mg by mouth every 6 (six) hours as needed for moderate pain.   albuterol (VENTOLIN HFA) 108 (90 Base) MCG/ACT inhaler INHALE 2 PUFFS INTO THE LUNGS EVERY 6 HOURS AS NEEDED FOR WHEEZING OR SHORTNESS OF BREATH   amLODipine (NORVASC) 10 MG tablet TAKE 1 TABLET(10 MG) BY MOUTH DAILY   aspirin EC 81 MG tablet Take 81 mg by mouth daily.   atorvastatin (LIPITOR) 10 MG tablet Take 1 tablet (10 mg total) by mouth at bedtime.   Blood Glucose Calibration (TRUE METRIX LEVEL 1) Low SOLN 1 each by In Vitro route daily as needed.   Blood Glucose Monitoring Suppl (TRUE METRIX METER) DEVI 1  each by Does not apply route daily.   glucose blood test strip Test BID   irbesartan (AVAPRO) 300 MG tablet TAKE 1 TABLET(300 MG) BY MOUTH DAILY   metFORMIN (GLUCOPHAGE-XR) 500 MG 24 hr tablet TAKE 1 TABLET BY MOUTH EVERY MORNING WITH BREAKFAST   methocarbamol (ROBAXIN) 500 MG tablet Take 500 mg by mouth at bedtime.   Multiple Vitamins-Minerals (MULTIVITAMIN WITH MINERALS) tablet Take 1 tablet by mouth daily.   phenazopyridine (PYRIDIUM) 95 MG tablet Take 1 tablet (95 mg total) by mouth 3 (three) times daily as needed for pain.   triamterene-hydrochlorothiazide (MAXZIDE-25) 37.5-25 MG tablet TAKE 1 TABLET BY MOUTH DAILY   No facility-administered encounter medications on file as of 11/10/2022.    Allergies (verified) Patient has no known allergies.   History: Past Medical History:  Diagnosis Date   Abdominal bloating    Acute midline low back pain without sciatica 10/09/2018   Arthritis    shoulders and neck   Breast cancer, left (HCC) 08/04/2014   a.) Bx (+) for invasive mammary carcinoma with DCIS; ER/PR (+), HER2/neu (-). (+) lymphovascular invasion with 1 of 2 lymph nodes being (+). Treated with lumpectomy + adjuvant chemoradiation.   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   Diastolic dysfunction 10/29/2020   a.)  TTE 10/29/2020: EF >55%; G1DD   DOE (dyspnea on exertion)    Elevated LFTs    Endometrial polyp 02/03/2021   a.) Pelvic US --> 18 x 5 x 10 mm lenticular nodule in endrometrial canal; favored endometrial polyp vs. neoplasm. b.) Bx 02/23/2021 --> no dysplasia or malignancy; benign.   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 IHC for cytokeratin AE1/AE3 and CD56 supports.   Osteoporosis of lumbar spine 03/12/2015   2021 - Prolia recommended by Oncology - to start once dental issues are resolved   Polyp of ascending colon    Polyp of descending colon    Polyp of sigmoid colon    Positive PPD, treated    Rectal polyp    T2DM (type 2 diabetes mellitus) (HCC) 06/2014   Valvular regurgitation 10/29/2020   a.) TTE 10/29/2020: EF >55%; trivial AR/TR/PR, mild MR   Past Surgical History:  Procedure Laterality Date   BIOPSY  11/11/2021   Procedure: BIOPSY;  Surgeon: Lemar Lofty., MD;  Location: [facility] ENDOSCOPY;  Service: Gastroenterology;;   BREAST BIOPSY Left 07/31/2014   IMC and DCIS    BREAST BIOPSY Left 03/08/2016   INTRADUCTAL PAPILLOMA    BREAST LUMPECTOMY Left 07/2014   IDC, DCIS, clear margins, positive LN   BREAST LUMPECTOMY Left 04/05/2016   excision of intraductal papilloma, no malignancy or atypia   BREAST LUMPECTOMY WITH NEEDLE LOCALIZATION Left 04/05/2016   Procedure: BREAST LUMPECTOMY WITH NEEDLE LOCALIZATION;  Surgeon: Gladis Riffle, MD;  Location: [facility] ORS;  Service: General;  Laterality: Left;   BREAST LUMPECTOMY WITH NEEDLE LOCALIZATION AND AXILLARY SENTINEL LYMPH NODE BX Left 08/27/14   CHOLECYSTECTOMY  09/07/2013   COLONOSCOPY WITH PROPOFOL N/A 08/28/2015   Procedure: COLONOSCOPY WITH PROPOFOL;  Surgeon: Midge Minium, MD;  Location: [facility] SURGERY CNTR;  Service: Endoscopy;  Laterality: N/A;  Diabetic oral   COLONOSCOPY WITH PROPOFOL N/A 08/14/2020   Procedure: COLONOSCOPY WITH PROPOFOL;  Surgeon: Pasty Spillers, MD;  Location: [facility] ENDOSCOPY;  Service: Endoscopy;  Laterality: N/A;   ENDOSCOPIC MUCOSAL RESECTION N/A  09/10/2020   Procedure: ENDOSCOPIC MUCOSAL RESECTION;  Surgeon: Meridee Score Netty Starring., MD;  Location: [facility] ENDOSCOPY;  Service: Gastroenterology;  Laterality: N/A;   ESOPHAGOGASTRODUODENOSCOPY  2014   gastritis   ESOPHAGOGASTRODUODENOSCOPY (EGD) WITH PROPOFOL N/A 04/16/2019   Procedure: ESOPHAGOGASTRODUODENOSCOPY (EGD) WITH PROPOFOL;  Surgeon: Pasty Spillers, MD;  Location: [facility] ENDOSCOPY;  Service: Endoscopy;  Laterality: N/A;   ESOPHAGOGASTRODUODENOSCOPY (EGD) WITH PROPOFOL N/A 10/03/2019   Procedure: ESOPHAGOGASTRODUODENOSCOPY (EGD) WITH PROPOFOL;  Surgeon: Pasty Spillers, MD;  Location: [facility] ENDOSCOPY;  Service: Endoscopy;  Laterality: N/A;   EUS N/A 09/10/2020   Procedure: LOWER ENDOSCOPIC ULTRASOUND (EUS);  Surgeon: Lemar Lofty., MD;  Location: [facility] ENDOSCOPY;  Service: Gastroenterology;  Laterality: N/A;   EUS N/A 11/11/2021   Procedure: LOWER ENDOSCOPIC ULTRASOUND (EUS);  Surgeon: Meridee Score Netty Starring., MD;  Location: [facility] ENDOSCOPY;  Service: Gastroenterology;  Laterality: N/A;   FLEXIBLE SIGMOIDOSCOPY N/A 08/27/2020   Procedure: FLEXIBLE SIGMOIDOSCOPY;  Surgeon: Pasty Spillers, MD;  Location: [facility] ENDOSCOPY;  Service: Endoscopy;  Laterality: N/A;   FLEXIBLE SIGMOIDOSCOPY N/A 09/10/2020   Procedure: FLEXIBLE SIGMOIDOSCOPY;  Surgeon: Meridee Score Netty Starring., MD;  Location: [facility] ENDOSCOPY;  Service: Gastroenterology;  Laterality: N/A;   FLEXIBLE SIGMOIDOSCOPY N/A 11/11/2021   Procedure: FLEXIBLE SIGMOIDOSCOPY;  Surgeon: Meridee Score Netty Starring., MD;  Location: [facility] ENDOSCOPY;  Service: Gastroenterology;  Laterality: N/A;   FOREIGN BODY REMOVAL  09/10/2020   Procedure: FOREIGN BODY REMOVAL ;  Surgeon: Meridee Score Netty Starring., MD;  Location: [facility] ENDOSCOPY;  Service: Gastroenterology;;   HEMOSTASIS CLIP PLACEMENT  09/10/2020   Procedure: HEMOSTASIS CLIP PLACEMENT;  Surgeon: Lemar Lofty., MD;  Location: [facility] ENDOSCOPY;  Service: Gastroenterology;;   HYSTEROSCOPY WITH D &  C N/A 03/25/2021   Procedure: DILATATION AND CURETTAGE Melton Krebs;  Surgeon: Vena Austria, MD;  Location: [facility] ORS;  Service: Gynecology;  Laterality: N/A;   POLYPECTOMY  08/28/2015   Procedure: POLYPECTOMY INTESTINAL;  Surgeon: Midge Minium, MD;  Location: [facility] SURGERY CNTR;  Service: Endoscopy;;  Rectal polyp   SUBMUCOSAL LIFTING INJECTION  09/10/2020   Procedure: SUBMUCOSAL LIFTING INJECTION;  Surgeon: Lemar Lofty., MD;  Location[facility] ENDOSCOPY;  Service: Gastroenterology;;   TUBAL LIGATION Bilateral    UMBILICAL HERNIA REPAIR N/A 08/09/2016   Procedure: HERNIA REPAIR UMBILICAL ADULT;  Surgeon: Henrene Dodge, MD;  Location: [facility] ORS;  Service: General;  Laterality: N/A;   VENTRAL HERNIA REPAIR N/A 04/10/2015   Procedure: HERNIA REPAIR VENTRAL ADULT;  Surgeon: Nadeen Landau, MD;  Location: [facility] ORS;  Service: General;  Laterality: N/A;   Family History  Problem Relation Age of Onset   Cancer Mother 4       Pancreatic   Cancer Father        Throat dx 31s   Healthy Sister    Cancer Sister 72       unk metastatic   Kidney cancer Sister 13   HIV Brother    Breast cancer Maternal Grandmother 74   Breast cancer Cousin        d. 42s   Social History   Socioeconomic History   Marital status: Widowed    Spouse name: Not on file   Number of children: 4   Years of education: Not on file   Highest education level: 10th grade  Occupational History   Occupation: Disabled  Tobacco Use   Smoking status: Every Day    Packs/day: 0.50    Years: 45.00    Additional pack years: 0.00    Total pack years: 22.50    Types: Cigarettes   Smokeless tobacco: Never   Tobacco comments:    Smokes 7 ciggs daily.  Vaping Use   Vaping Use: Never used  Substance and Sexual Activity   Alcohol use: Yes    Alcohol/week: 2.0 standard drinks of alcohol    Types: 2 Cans of beer per week    Comment: weekends - beer    Drug use: No   Sexual activity: Not Currently  Other Topics  Concern   Not on file  Social History Narrative   Pt lives alone, son lives 2 doors down.   Social Determinants of Health   Financial Resource Strain: Low Risk  (11/10/2022)   Overall Financial Resource Strain (CARDIA)    Difficulty of Paying Living Expenses: Not hard at all  Food Insecurity: No Food Insecurity (11/10/2022)  Hunger Vital Sign    Worried About Running Out of Food in the Last Year: Never true    Ran Out of Food in the Last Year: Never true  Transportation Needs: No Transportation Needs (11/10/2022)   PRAPARE - Administrator, Civil Service (Medical): No    Lack of Transportation (Non-Medical): No  Physical Activity: Insufficiently Active (11/10/2022)   Exercise Vital Sign    Days of Exercise per Week: 3 days    Minutes of Exercise per Session: 30 min  Stress: No Stress Concern Present (11/10/2022)   [employer] of Occupational Health - Occupational Stress Questionnaire    Feeling of Stress : Not at all  Social Connections: Moderately Isolated (11/10/2022)   Social Connection and Isolation Panel [NHANES]    Frequency of Communication with Friends and Family: More than three times a week    Frequency of Social Gatherings with Friends and Family: More than three times a week    Attends Religious Services: 1 to 4 times per year    Active Member of [employer] or Organizations: No    Attends Banker Meetings: Never    Marital Status: Widowed    Tobacco Counseling Ready to quit: Not Answered Counseling given: Not Answered Tobacco comments: Smokes 7 ciggs daily.   Clinical Intake:  Pre-visit preparation completed: Yes  Pain : No/denies pain     Nutritional Risks: None Diabetes: Yes CBG done?: No Did pt. bring in CBG monitor from home?: No     Diabetic?yes Nutrition Risk Assessment:  Has the patient had any N/V/D within the last 2 months?  No  Does the patient have any non-healing wounds?  No  Has the patient had any unintentional  weight loss or weight gain?  No   Diabetes:  Is the patient diabetic?  Yes  If diabetic, was a CBG obtained today?  No  Did the patient bring in their glucometer from home?  No  How often do you monitor your CBG's? 3xweek.   Financial Strains and Diabetes Management:  Are you having any financial strains with the device, your supplies or your medication? No .  Does the patient want to be seen by Chronic Care Management for management of their diabetes?  No  Would the patient like to be referred to a Nutritionist or for Diabetic Management?  No   Diabetic Exams:  Diabetic Eye Exam: Completed 01/26/22. Marland Kitchen Pt has been advised about the importance in completing this exam.   Diabetic Foot Exam: Completed 07/20/22. Pt has been advised about the importance in completing this exam.    Interpreter Needed?: No  Information entered by :: Kennedy Bucker, LPN   Activities of Daily Living    11/10/2022    8:24 AM  In your present state of health, do you have any difficulty performing the following activities:  Hearing? 0  Vision? 0  Difficulty concentrating or making decisions? 0  Walking or climbing stairs? 0  Dressing or bathing? 0  Doing errands, shopping? 0  Preparing Food and eating ? N  Using the Toilet? N  In the past six months, have you accidently leaked urine? N  Do you have problems with loss of bowel control? N  Managing your Medications? N  Managing your Finances? N  Housekeeping or managing your Housekeeping? N    Patient Care Team: Reubin Milan, MD as PCP - General (Internal Medicine) Pasty Spillers, MD (Inactive) as Consulting Physician (Gastroenterology) Gwyneth Revels, DPM as  Referring Physician (Podiatry) [facility] (Ophthalmology) Vena Austria, MD as Referring Physician (Obstetrics and Gynecology) Darr, Celine Mans (Orthopedic Surgery)  Indicate any recent Medical Services you may have received from other than [facility] providers in the past  year (date may be approximate).     Assessment:   This is a routine wellness examination for Floriene.  Hearing/Vision screen Hearing Screening - Comments:: No aids Vision Screening - Comments:: Readers- Dr.Woodard  Dietary issues and exercise activities discussed: Current Exercise Habits: Home exercise routine, Type of exercise: walking, Time (Minutes): 30, Frequency (Times/Week): 3, Weekly Exercise (Minutes/Week): 90, Intensity: Mild   Goals Addressed             This Visit's Progress    DIET - EAT MORE FRUITS AND VEGETABLES         Depression Screen    11/10/2022    8:22 AM 07/20/2022   10:35 AM 06/27/2022    2:39 PM 03/17/2022    9:27 AM 11/03/2021    8:26 AM 07/06/2021    9:01 AM 07/06/2021    8:58 AM  PHQ 2/9 Scores  PHQ - 2 Score 0 0 2 2 4  0 0  PHQ- 9 Score 0 3 3 4 10 1 1     Fall Risk    11/10/2022    8:23 AM 07/20/2022   10:35 AM 03/17/2022    9:27 AM 11/03/2021    8:26 AM 07/06/2021    9:01 AM  Fall Risk   Falls in the past year? 0 1 0 0 0  Number falls in past yr: 0 1 0 0 0  Injury with Fall? 0 0 0 0 0  Risk for fall due to : No Fall Risks No Fall Risks No Fall Risks No Fall Risks No Fall Risks  Follow up Falls prevention discussed;Falls evaluation completed Falls evaluation completed Falls evaluation completed Falls evaluation completed Falls evaluation completed    FALL RISK PREVENTION PERTAINING TO THE HOME:  Any stairs in or around the home? Yes  If so, are there any without handrails? No  Home free of loose throw rugs in walkways, pet beds, electrical cords, etc? Yes  Adequate lighting in your home to reduce risk of falls? Yes   ASSISTIVE DEVICES UTILIZED TO PREVENT FALLS:  Life alert? No  Use of a cane, walker or w/c? No  Grab bars in the bathroom? Yes  Shower chair or bench in shower? No  Elevated toilet seat or a handicapped toilet? No    Cognitive Function:        11/10/2022    8:28 AM 11/03/2021    8:21 AM 07/10/2019   10:18 AM 07/09/2018    11:01 AM 05/18/2017   10:49 AM  6CIT Screen  What Year? 0 points 0 points 0 points 0 points 0 points  What month? 0 points 0 points 0 points 0 points 0 points  What time? 0 points 0 points 0 points 0 points 0 points  Count back from 20 0 points 2 points 0 points 0 points 0 points  Months in reverse 2 points 0 points 0 points 0 points 2 points  Repeat phrase 2 points 2 points 0 points 0 points 8 points  Total Score 4 points 4 points 0 points 0 points 10 points    Immunizations Immunization History  Administered Date(s) Administered   Influenza Inj Mdck Quad With Preservative 03/12/2018   Influenza,inj,Quad PF,6+ Mos 02/22/2016, 02/18/2019, 03/05/2021, 03/17/2022   Influenza-Unspecified 01/30/2014, 02/25/2017, 03/12/2018  PFIZER(Purple Top)SARS-COV-2 Vaccination 10/21/2019, 11/13/2019   PNEUMOCOCCAL CONJUGATE-20 11/03/2021   Pneumococcal Polysaccharide-23 01/25/2017   Zoster Recombinat (Shingrix) 07/09/2021, 10/03/2021    TDAP status: Due, Education has been provided regarding the importance of this vaccine. Advised may receive this vaccine at local pharmacy or Health Dept. Aware to provide a copy of the vaccination record if obtained from local pharmacy or Health Dept. Verbalized acceptance and understanding.  Flu Vaccine status: Up to date  Pneumococcal vaccine status: Up to date  Covid-19 vaccine status: Completed vaccines  Qualifies for Shingles Vaccine? Yes   Zostavax completed No   Shingrix Completed?: Yes  Screening Tests Health Maintenance  Topic Date Due   DTaP/Tdap/Td (1 - Tdap) Never done   COVID-19 Vaccine (3 - Pfizer risk series) 12/11/2019   INFLUENZA VACCINE  12/29/2022   HEMOGLOBIN A1C  01/18/2023   OPHTHALMOLOGY EXAM  01/27/2023   Diabetic kidney evaluation - Urine ACR  07/21/2023   FOOT EXAM  07/21/2023   Lung Cancer Screening  08/17/2023   MAMMOGRAM  09/06/2023   Diabetic kidney evaluation - eGFR measurement  09/13/2023   Medicare Annual Wellness  (AWV)  11/10/2023   Colonoscopy  08/14/2025   PAP SMEAR-Modifier  11/07/2027   DEXA SCAN  10/31/2032   Pneumococcal Vaccine 68-71 Years old  Completed   HIV Screening  Completed   Zoster Vaccines- Shingrix  Completed   Hepatitis C Screening  Addressed   HPV VACCINES  Aged Out    Health Maintenance  Health Maintenance Due  Topic Date Due   DTaP/Tdap/Td (1 - Tdap) Never done   COVID-19 Vaccine (3 - Pfizer risk series) 12/11/2019    Colorectal cancer screening: Type of screening: Colonoscopy. Completed 08/14/20. Repeat every 5 years  Mammogram status: Completed 09/06/22. Repeat every year   Lung Cancer Screening: (Low Dose CT Chest recommended if Age 52-80 years, 30 pack-year currently smoking OR have quit w/in 15years.) does qualify.   Lung Cancer Screening Referral: ordered on 08/19/22  Additional Screening:  Hepatitis C Screening: does qualify; Completed 08/28/13  Vision Screening: Recommended annual ophthalmology exams for early detection of glaucoma and other disorders of the eye. Is the patient up to date with their annual eye exam?  Yes  Who is the provider or what is the name of the office in which the patient attends annual eye exams? Dr.Woodard If pt is not established with a provider, would they like to be referred to a provider to establish care? No .   Dental Screening: Recommended annual dental exams for proper oral hygiene  Community Resource Referral / Chronic Care Management: CRR required this visit?  No   CCM required this visit?  No      Plan:     I have personally reviewed and noted the following in the patient's chart:   Medical and social history Use of alcohol, tobacco or illicit drugs  Current medications and supplements including opioid prescriptions. Patient is not currently taking opioid prescriptions. Functional ability and status Nutritional status Physical activity Advanced directives List of other physicians Hospitalizations, surgeries,  and ER visits in previous 12 months Vitals Screenings to include cognitive, depression, and falls Referrals and appointments  In addition, I have reviewed and discussed with patient certain preventive protocols, quality metrics, and best practice recommendations. A written personalized care plan for preventive services as well as general preventive health recommendations were provided to patient.     Hal Hope, LPN   1/61/0960   Nurse Notes: none

## 2022-11-22 ENCOUNTER — Ambulatory Visit: Payer: 59 | Admitting: Internal Medicine

## 2022-11-28 ENCOUNTER — Telehealth: Payer: Self-pay

## 2022-11-28 DIAGNOSIS — R1032 Left lower quadrant pain: Secondary | ICD-10-CM

## 2022-11-28 DIAGNOSIS — C7A8 Other malignant neuroendocrine tumors: Secondary | ICD-10-CM

## 2022-11-28 DIAGNOSIS — C50912 Malignant neoplasm of unspecified site of left female breast: Secondary | ICD-10-CM

## 2022-11-28 NOTE — Telephone Encounter (Signed)
Called pt x2 this afternoon, was sent straight to VM. Left detailed asking pt to call us back and update [place] on sx

## 2022-11-28 NOTE — Telephone Encounter (Signed)
-----   Message from [name], MD sent at 11/27/2022 11:18 PM EDT ----- Please make a follow up phone call to her regarding LLQ pain  If pain persists, I recommend her to get a CT abdomen pelvis w contrast for further evaluation.  Thanks Zy ----- Message ----- From: Trenda Moots Sent: 11/09/2022   3:44 PM EDT To: [name], MD  Pt having LLQ sx with tenderness on exam. Had neg GYN u/s. Wanted to make you aware in case further eval needed. Thank you.

## 2022-11-29 NOTE — Telephone Encounter (Signed)
Spoke to pt and she is agreeable to proceed with CT abd pelv.   Please schedule - routine- and inform pt of appt details.   Keep appts in April as scheduled

## 2022-11-29 NOTE — Addendum Note (Signed)
Addended by: Coralee Rud on: 11/29/2022 11:02 AM   Modules accepted: Orders

## 2022-12-06 ENCOUNTER — Ambulatory Visit
Admission: RE | Admit: 2022-12-06 | Discharge: 2022-12-06 | Disposition: A | Discharge status: Home / Self Care | Payer: 59 | Source: Ambulatory Visit | Attending: Oncology | Admitting: Oncology

## 2022-12-06 DIAGNOSIS — N289 Disorder of kidney and ureter, unspecified: Secondary | ICD-10-CM | POA: Diagnosis not present

## 2022-12-06 DIAGNOSIS — R1032 Left lower quadrant pain: Secondary | ICD-10-CM | POA: Insufficient documentation

## 2022-12-06 DIAGNOSIS — N2889 Other specified disorders of kidney and ureter: Secondary | ICD-10-CM

## 2022-12-06 DIAGNOSIS — C7A8 Other malignant neuroendocrine tumors: Secondary | ICD-10-CM | POA: Insufficient documentation

## 2022-12-06 HISTORY — DX: Other specified disorders of kidney and ureter: N28.89

## 2022-12-06 LAB — POCT I-STAT CREATININE: Creatinine, Ser: 0.8 mg/dL (ref 0.44–1.00)

## 2022-12-06 MED ORDER — IOHEXOL 300 MG/ML  SOLN
100.0000 mL | Freq: Once | INTRAMUSCULAR | Status: AC | PRN
  Administered 2022-12-06: 100 mL via INTRAVENOUS

## 2022-12-12 ENCOUNTER — Telehealth: Payer: Self-pay

## 2022-12-12 DIAGNOSIS — R935 Abnormal findings on diagnostic imaging of other abdominal regions, including retroperitoneum: Secondary | ICD-10-CM

## 2022-12-12 DIAGNOSIS — N2889 Other specified disorders of kidney and ureter: Secondary | ICD-10-CM

## 2022-12-12 NOTE — Telephone Encounter (Signed)
Referral placed and pt informed of referral.

## 2022-12-12 NOTE — Telephone Encounter (Signed)
-----   Message from [name] sent at 12/11/2022  4:28 PM EDT ----- CT showed increased size of kidney mass. Please refer her to Urology.

## 2022-12-16 DIAGNOSIS — I70219 Atherosclerosis of native arteries of extremities with intermittent claudication, unspecified extremity: Secondary | ICD-10-CM | POA: Diagnosis not present

## 2022-12-16 DIAGNOSIS — I739 Peripheral vascular disease, unspecified: Secondary | ICD-10-CM | POA: Diagnosis not present

## 2022-12-16 DIAGNOSIS — I10 Essential (primary) hypertension: Secondary | ICD-10-CM | POA: Diagnosis not present

## 2022-12-16 DIAGNOSIS — I25118 Atherosclerotic heart disease of native coronary artery with other forms of angina pectoris: Secondary | ICD-10-CM | POA: Diagnosis not present

## 2022-12-16 DIAGNOSIS — E782 Mixed hyperlipidemia: Secondary | ICD-10-CM | POA: Diagnosis not present

## 2022-12-19 ENCOUNTER — Encounter: Payer: Self-pay | Admitting: Internal Medicine

## 2022-12-19 ENCOUNTER — Ambulatory Visit (INDEPENDENT_AMBULATORY_CARE_PROVIDER_SITE_OTHER): Payer: 59 | Admitting: Internal Medicine

## 2022-12-19 VITALS — BP 124/70 | HR 88 | Ht 68.0 in | Wt 202.0 lb

## 2022-12-19 DIAGNOSIS — R109 Unspecified abdominal pain: Secondary | ICD-10-CM

## 2022-12-19 DIAGNOSIS — F17209 Nicotine dependence, unspecified, with unspecified nicotine-induced disorders: Secondary | ICD-10-CM | POA: Diagnosis not present

## 2022-12-19 DIAGNOSIS — N2889 Other specified disorders of kidney and ureter: Secondary | ICD-10-CM | POA: Diagnosis not present

## 2022-12-19 DIAGNOSIS — I10 Essential (primary) hypertension: Secondary | ICD-10-CM

## 2022-12-19 DIAGNOSIS — E118 Type 2 diabetes mellitus with unspecified complications: Secondary | ICD-10-CM | POA: Diagnosis not present

## 2022-12-19 DIAGNOSIS — G8929 Other chronic pain: Secondary | ICD-10-CM | POA: Diagnosis not present

## 2022-12-19 DIAGNOSIS — Z85528 Personal history of other malignant neoplasm of kidney: Secondary | ICD-10-CM | POA: Insufficient documentation

## 2022-12-19 LAB — POCT GLYCOSYLATED HEMOGLOBIN (HGB A1C): Hemoglobin A1C: 6.1 % — AB (ref 4.0–5.6)

## 2022-12-19 NOTE — Assessment & Plan Note (Addendum)
Blood sugars stable without hypoglycemic symptoms or events. Current regimen is metformin. Changes made last visit are none. Lab Results  Component Value Date   HGBA1C 6.2 (H) 07/20/2022  A1C today 6.1

## 2022-12-19 NOTE — Assessment & Plan Note (Signed)
Seen on recent CT abd/pelvis. Seeing Urology next month for management

## 2022-12-19 NOTE — Patient Instructions (Signed)
Schedule diabetic eye exam with Dr. Clydene Pugh

## 2022-12-19 NOTE — Assessment & Plan Note (Signed)
Normal exam with stable BP on avapro, amlodipine and hctz. No concerns or side effects to current medication. No change in regimen; continue low sodium diet.

## 2022-12-19 NOTE — Progress Notes (Signed)
Date:  12/19/2022   Name:  Donna Riley   DOB:  1960-06-01   MRN:  [id number]   Chief Complaint: Diabetes  Hypertension This is a chronic problem. The problem is controlled. Pertinent negatives include no chest pain, headaches, palpitations or shortness of breath.  Diabetes She presents for her follow-up diabetic visit. She has type 2 diabetes mellitus. Pertinent negatives for hypoglycemia include no dizziness, headaches or nervousness/anxiousness. Pertinent negatives for diabetes include no chest pain, no fatigue and no weakness. Current diabetic treatment includes oral agent (monotherapy).  Flank Pain This is a chronic problem. The problem occurs daily. The problem is unchanged. The pain is present in the sacro-iliac. The pain does not radiate. The pain is mild. The symptoms are aggravated by standing. Pertinent negatives include no abdominal pain, chest pain, dysuria, headaches or weakness. Treatments tried: tylenol.    Lab Results  Component Value Date   NA 135 09/13/2022   K 3.8 09/13/2022   CO2 23 09/13/2022   GLUCOSE 117 (H) 09/13/2022   BUN 20 09/13/2022   CREATININE 0.80 12/06/2022   CALCIUM 9.3 09/13/2022   GFRNONAA >60 09/13/2022   Lab Results  Component Value Date   CHOL 148 07/20/2022   HDL 30 (L) 07/20/2022   LDLCALC 59 07/20/2022   TRIG 293 (H) 07/20/2022   CHOLHDL 4.9 07/20/2022   Lab Results  Component Value Date   TSH 1.765 04/11/2018   Lab Results  Component Value Date   HGBA1C 6.1 (A) 12/19/2022   Lab Results  Component Value Date   WBC 9.4 09/13/2022   HGB 14.3 09/13/2022   HCT 44.1 09/13/2022   MCV 85.1 09/13/2022   PLT 258 09/13/2022   Lab Results  Component Value Date   ALT 24 09/13/2022   AST 26 09/13/2022   ALKPHOS 93 09/13/2022   BILITOT 0.6 09/13/2022   No results found for: "25OHVITD2", "25OHVITD3", "VD25OH"   Review of Systems  Constitutional:  Negative for fatigue and unexpected weight change.  HENT:  Negative for  nosebleeds and trouble swallowing.   Eyes:  Negative for visual disturbance.  Respiratory:  Negative for cough, chest tightness, shortness of breath and wheezing.   Cardiovascular:  Negative for chest pain, palpitations and leg swelling.  Gastrointestinal:  Negative for abdominal pain, constipation and diarrhea.  Genitourinary:  Positive for flank pain. Negative for dysuria and hematuria.  Musculoskeletal:  Positive for back pain (right low back/flank pain going from sitting to standing).  Neurological:  Negative for dizziness, weakness, light-headedness and headaches.  Psychiatric/Behavioral:  Negative for decreased concentration. The patient is not nervous/anxious.     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   Lung nodule 03/07/2022   Tobacco use 03/07/2022   Centrilobular emphysema (HCC) 02/17/2022   Genetic testing 06/10/2021   Endometrial polyp    Benign neuroendocrine tumor of sigmoid colon    History of rectal polyps    Nodule of colon    Sebaceous cyst 08/11/2020   Osteopenia 07/19/2020   Breast pain, right 07/03/2020   Atherosclerosis of abdominal aorta (HCC) 02/06/2020   Tobacco use disorder, continuous 01/14/2020   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   Lumbosacral spondylosis without myelopathy 11/22/2018   Vertigo 10/09/2018   Hyperlipidemia associated with  type 2 diabetes mellitus (HCC) 04/11/2018   Gastroparesis 04/11/2018   Long term (current) use of aromatase inhibitors 12/25/2017   Arthritis    Elevated LFTs    Essential hypertension    Positive PPD, treated    Irritable bowel syndrome with both constipation and diarrhea 07/20/2015   Type II diabetes mellitus with complication (HCC) 06/22/2015   Pre-ulcerative calluses  06/22/2015   Neuropathy 06/22/2015   Hot flash, menopausal 04/09/2015   Atherosclerotic peripheral vascular disease with intermittent claudication (HCC) 03/25/2015   Malignant neoplasm of left breast in female, estrogen receptor positive (HCC) 05/30/2014   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    No Known Allergies  Past Surgical History:  Procedure Laterality Date   BIOPSY  11/11/2021   Procedure: BIOPSY;  Surgeon: Lemar Lofty., MD;  Location: [facility] ENDOSCOPY;  Service: Gastroenterology;;   BREAST BIOPSY Left 07/31/2014   IMC and DCIS    BREAST BIOPSY Left 03/08/2016   INTRADUCTAL PAPILLOMA    BREAST LUMPECTOMY Left 07/2014   IDC, DCIS, clear margins, positive LN   BREAST LUMPECTOMY Left 04/05/2016   excision of intraductal papilloma, no malignancy or atypia   BREAST LUMPECTOMY WITH NEEDLE LOCALIZATION Left 04/05/2016   Procedure: BREAST LUMPECTOMY WITH NEEDLE LOCALIZATION;  Surgeon: Gladis Riffle, MD;  Location: [facility] ORS;  Service: General;  Laterality: Left;   BREAST LUMPECTOMY WITH NEEDLE LOCALIZATION AND AXILLARY SENTINEL LYMPH NODE BX Left 08/27/14   CHOLECYSTECTOMY  09/07/2013   COLONOSCOPY WITH PROPOFOL N/A 08/28/2015   Procedure: COLONOSCOPY WITH PROPOFOL;  Surgeon: Midge Minium, MD;  Location: [facility];  Service: Endoscopy;  Laterality: N/A;  Diabetic oral   COLONOSCOPY WITH PROPOFOL N/A 08/14/2020   Procedure: COLONOSCOPY WITH PROPOFOL;  Surgeon: Pasty Spillers, MD;  Location: [facility] ENDOSCOPY;  Service: Endoscopy;  Laterality: N/A;   ENDOSCOPIC MUCOSAL RESECTION N/A 09/10/2020   Procedure: ENDOSCOPIC MUCOSAL RESECTION;  Surgeon: Meridee Score Netty Starring., MD;  Location: [facility] ENDOSCOPY;  Service: Gastroenterology;  Laterality: N/A;   ESOPHAGOGASTRODUODENOSCOPY  2014   gastritis   ESOPHAGOGASTRODUODENOSCOPY (EGD) WITH PROPOFOL N/A 04/16/2019   Procedure: ESOPHAGOGASTRODUODENOSCOPY (EGD) WITH PROPOFOL;  Surgeon: Pasty Spillers, MD;  Location: [facility]  ENDOSCOPY;  Service: Endoscopy;  Laterality: N/A;   ESOPHAGOGASTRODUODENOSCOPY (EGD) WITH PROPOFOL N/A 10/03/2019   Procedure: ESOPHAGOGASTRODUODENOSCOPY (EGD) WITH PROPOFOL;  Surgeon: Pasty Spillers, MD;  Location: [facility] ENDOSCOPY;  Service: Endoscopy;  Laterality: N/A;   EUS N/A 09/10/2020   Procedure: LOWER ENDOSCOPIC ULTRASOUND (EUS);  Surgeon: Lemar Lofty., MD;  Location: [facility] ENDOSCOPY;  Service: Gastroenterology;  Laterality: N/A;   EUS N/A 11/11/2021   Procedure: LOWER ENDOSCOPIC ULTRASOUND (EUS);  Surgeon: Lemar Lofty., MD;  Location: [facility] ENDOSCOPY;  Service: Gastroenterology;  Laterality: N/A;   FLEXIBLE SIGMOIDOSCOPY N/A 08/27/2020   Procedure: FLEXIBLE SIGMOIDOSCOPY;  Surgeon: Pasty Spillers, MD;  Location: [facility] ENDOSCOPY;  Service: Endoscopy;  Laterality: N/A;   FLEXIBLE SIGMOIDOSCOPY N/A 09/10/2020   Procedure: FLEXIBLE SIGMOIDOSCOPY;  Surgeon: Meridee Score Netty Starring., MD;  Location: [facility] ENDOSCOPY;  Service: Gastroenterology;  Laterality: N/A;   FLEXIBLE SIGMOIDOSCOPY N/A 11/11/2021   Procedure: FLEXIBLE SIGMOIDOSCOPY;  Surgeon: Meridee Score Netty Starring., MD;  Location: [facility] ENDOSCOPY;  Service: Gastroenterology;  Laterality: N/A;   FOREIGN BODY REMOVAL  09/10/2020   Procedure: FOREIGN BODY REMOVAL ;  Surgeon: Meridee Score Netty Starring., MD;  Location: [facility] ENDOSCOPY;  Service: Gastroenterology;;   HEMOSTASIS CLIP PLACEMENT  09/10/2020   Procedure: HEMOSTASIS CLIP PLACEMENT;  Surgeon: Lemar Lofty., MD;  Location: [facility] ENDOSCOPY;  Service: Gastroenterology;;   HYSTEROSCOPY WITH D & C N/A 03/25/2021   Procedure: DILATATION AND CURETTAGE /HYSTEROSCOPY;  Surgeon: Vena Austria, MD;  Location: [facility] ORS;  Service: Gynecology;  Laterality: N/A;   POLYPECTOMY  08/28/2015   Procedure: POLYPECTOMY INTESTINAL;  Surgeon: Midge Minium, MD;  Location: [facility] SUR[facility]ERY [facility];  Service: Endoscopy;;  Rectal polyp   SUBMUCOSAL LIFTING INJECTION  09/10/2020   Procedure: SUBMUCOSAL  LIFTING INJECTION;  Surgeon: Lemar Lofty., MD;  Location: [facility] ENDOSCOPY;  Service: Gastroenterology;;   TUBAL LIGATION Bilateral    UMBILICAL HERNIA REPAIR N/A 08/09/2016   Procedure: HERNIA REPAIR UMBILICAL ADULT;  Surgeon: Henrene Dodge, MD;  Location: [facility] ORS;  Service: General;  Laterality: N/A;   VENTRAL HERNIA REPAIR N/A 04/10/2015   Procedure: HERNIA REPAIR VENTRAL ADULT;  Surgeon: Nadeen Landau, MD;  Location: [facility] ORS;  Service: General;  Laterality: N/A;    Social History   Tobacco Use   Smoking status: Every Day    Current packs/day: 0.50    Average packs/day: 0.5 packs/day for 45.0 years (22.5 ttl pk-yrs)    Types: Cigarettes   Smokeless tobacco: Never   Tobacco comments:    Smokes 7 ciggs daily.  Vaping Use   Vaping status: Never Used  Substance Use Topics   Alcohol use: Yes    Alcohol/week: 2.0 standard drinks of alcohol    Types: 2 Cans of beer per week    Comment: weekends - beer    Drug use: No     Medication list has been reviewed and updated.  Current Meds  Medication Sig   acetaminophen (TYLENOL) 500 MG tablet Take 1,000 mg by mouth every 6 (six) hours as needed for moderate pain.   albuterol (VENTOLIN HFA) 108 (90 Base) MCG/ACT inhaler INHALE 2 PUFFS INTO THE LUNGS EVERY 6 HOURS AS NEEDED FOR WHEEZING OR SHORTNESS OF BREATH   amLODipine (NORVASC) 10 MG tablet TAKE 1 TABLET(10 MG) BY MOUTH DAILY   aspirin EC 81 MG tablet Take 81 mg by mouth daily.   atorvastatin (LIPITOR) 10 MG tablet Take 1 tablet (10 mg total) by mouth at bedtime.   Blood Glucose Calibration (TRUE METRIX LEVEL 1) Low SOLN 1 each by In Vitro route daily as needed.   Blood Glucose Monitoring Suppl (TRUE METRIX METER) DEVI 1 each by Does not apply route daily.   glucose blood test strip Test BID   irbesartan (AVAPRO) 300 MG tablet TAKE 1 TABLET(300 MG) BY MOUTH DAILY   metFORMIN (GLUCOPHAGE-XR) 500 MG 24 hr tablet TAKE 1 TABLET BY MOUTH EVERY MORNING WITH BREAKFAST    methocarbamol (ROBAXIN) 500 MG tablet Take 500 mg by mouth at bedtime.   Multiple Vitamins-Minerals (MULTIVITAMIN WITH MINERALS) tablet Take 1 tablet by mouth daily.   phenazopyridine (PYRIDIUM) 95 MG tablet Take 1 tablet (95 mg total) by mouth 3 (three) times daily as needed for pain.   triamterene-hydrochlorothiazide (MAXZIDE-25) 37.5-25 MG tablet TAKE 1 TABLET BY MOUTH DAILY       12/19/2022   11:00 AM 07/20/2022   10:35 AM 03/17/2022    9:27 AM 11/03/2021    8:26 AM  GAD 7 : Generalized Anxiety Score  Nervous, Anxious, on Edge 1 0 0 0  Control/stop worrying 0 0 1 0  Worry too much - different things 1 0 1 0  Trouble relaxing 0 0 0 0  Restless 0 1 1 1   Easily annoyed or irritable 0 1 1 0  Afraid - awful might happen 1 1  0 0  Total GAD 7 Score 3 3 4 1   Anxiety Difficulty Not difficult at all Not difficult at all Not difficult at all Not difficult at all       12/19/2022   11:00 AM 11/10/2022    8:22 AM 07/20/2022   10:35 AM  Depression screen PHQ 2/9  Decreased Interest 2 0 0  Down, Depressed, Hopeless 2 0 0  PHQ - 2 Score 4 0 0  Altered sleeping 2 0 1  Tired, decreased energy 2 0 1  Change in appetite 0 0 1  Feeling bad or failure about yourself  0 0 0  Trouble concentrating 0 0 0  Moving slowly or fidgety/restless 0 0 0  Suicidal thoughts 0 0 0  PHQ-9 Score 8 0 3  Difficult doing work/chores Not difficult at all Not difficult at all Not difficult at all    BP Readings from Last 3 Encounters:  12/19/22 124/70  11/07/22 104/66  09/13/22 (!) 142/67    Physical Exam Vitals and nursing note reviewed.  Constitutional:      General: She is not in acute distress.    Appearance: She is well-developed.  HENT:     Head: Normocephalic and atraumatic.  Cardiovascular:     Rate and Rhythm: Normal rate and regular rhythm.     Heart sounds: No murmur heard. Pulmonary:     Effort: Pulmonary effort is normal. No respiratory distress.     Breath sounds: No wheezing or rhonchi.   Musculoskeletal:     Cervical back: Normal range of motion.     Lumbar back: Negative right straight leg raise test and negative left straight leg raise test.       Back:     Comments: Tenderness in SI region  Lymphadenopathy:     Cervical: No cervical adenopathy.  Skin:    General: Skin is warm and dry.     Findings: No rash.  Neurological:     Mental Status: She is alert and oriented to person, place, and time.     Sensory: Sensation is intact.     Motor: Motor function is intact.     Gait: Gait is intact.  Psychiatric:        Mood and Affect: Mood normal.        Behavior: Behavior normal.     Wt Readings from Last 3 Encounters:  12/19/22 202 lb (91.6 kg)  11/10/22 202 lb (91.6 kg)  11/07/22 202 lb 11.2 oz (91.9 kg)    BP 124/70   Pulse 88   Ht 5\' 8"  (1.727 m)   Wt 202 lb (91.6 kg)   SpO2 96%   BMI 30.71 kg/m   Assessment and Plan:  Problem List Items Addressed This Visit     Type II diabetes mellitus with complication (HCC) - Primary (Chronic)    Blood sugars stable without hypoglycemic symptoms or events. Current regimen is metformin. Changes made last visit are none. Lab Results  Component Value Date   HGBA1C 6.2 (H) 07/20/2022  A1C today 6.1       Relevant Orders   POCT glycosylated hemoglobin (Hb A1C) (Completed)   Tobacco use disorder, continuous (Chronic)    Recent LDCT scan done for screening and nodule follow up. Findings stable. Continue annual exam. Pt continues to smoke less than 1/2 ppd      Left renal mass    Seen on recent CT abd/pelvis. Seeing Urology next month for management  Essential hypertension (Chronic)    Normal exam with stable BP on avapro, amlodipine and hctz. No concerns or side effects to current medication. No change in regimen; continue low sodium diet.       Other Visit Diagnoses     Right flank pain, chronic       MSK in nature recommend topicall rubs and ice or heat as needed       Return in  about 4 months (around 04/21/2023) for DM, HTN.    Reubin Milan, MD [facility] [place]

## 2022-12-19 NOTE — Assessment & Plan Note (Signed)
Recent LDCT scan done for screening and nodule follow up. Findings stable. Continue annual exam. Pt continues to smoke less than 1/2 ppd

## 2023-01-02 ENCOUNTER — Encounter: Payer: Self-pay | Admitting: Urology

## 2023-01-02 ENCOUNTER — Ambulatory Visit (INDEPENDENT_AMBULATORY_CARE_PROVIDER_SITE_OTHER): Payer: 59 | Admitting: Urology

## 2023-01-02 VITALS — BP 148/77 | HR 94 | Wt 202.0 lb

## 2023-01-02 DIAGNOSIS — N2889 Other specified disorders of kidney and ureter: Secondary | ICD-10-CM

## 2023-01-02 LAB — URINALYSIS, COMPLETE
Bilirubin, UA: NEGATIVE
Glucose, UA: NEGATIVE
Leukocytes,UA: NEGATIVE
Nitrite, UA: NEGATIVE
Specific Gravity, UA: >1.03 — ABNORMAL HIGH (ref 1.005–1.030)
Urobilinogen, Ur: 0.2 mg/dL (ref 0.2–1.0)
pH, UA: 5.5 (ref 5.0–7.5)

## 2023-01-02 LAB — MICROSCOPIC EXAMINATION

## 2023-01-02 NOTE — Progress Notes (Signed)
I,Dina M Abdulla,acting as a scribe for Donna Altes, MD.,have documented all relevant documentation on the behalf of Donna Altes, MD,as directed by  Donna Altes, MD while in the presence of Donna Altes, MD.  01/02/2023 12:07 PM   Barbaraann Barthel 1961-01-03 [id number]  Referring provider: [name], MD [address] [place],  [place] [address]  Chief Complaint  Patient presents with   kidney mass    HPI: BRANDEY Riley is a 62 y.o. female referred for evaluation of left renal mass.  Followed by oncology for breast cancer and neuroendocrine carcinoma of the rectum. CT abdomen/pelvis with contrast performed 12/06/2022 for surveillance of neuroendocrine carcinoma showed a 2.1 x 1.8 x 2.4 cm enhancing lesion of the left lower pole of the kidney. This was also seen on a CT abdomen/pelvis of 07/08/22, however, was not commented on by the radiologist. It was not present on a prior CT of 09/2020. On the CT February 2024, it measured 1.7 x 1.5 x 1.8 cm. She denies flank, abdominal, or pelvic pain. No bothersome LUTS or gross hematuria. Past urologic history remarkable for stone disease. Recent CT showed no urinary calculi.   PMH: Past Medical History:  Diagnosis Date   Abdominal bloating    Acute midline low back pain without sciatica 10/09/2018   Arthritis    shoulders and neck   Breast cancer, left (HCC) 08/04/2014   a.) Bx (+) for invasive mammary carcinoma with DCIS; ER/PR (+), HER2/neu (-). (+) lymphovascular invasion with 1 of 2 lymph nodes being (+). Treated with lumpectomy + adjuvant chemoradiation.   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   Diastolic dysfunction 10/29/2020   a.)  TTE 10/29/2020: EF >55%; G1DD   DOE (dyspnea on exertion)    Elevated LFTs    Endometrial  polyp 02/03/2021   a.) Pelvic US --> 18 x 5 x 10 mm lenticular nodule in endrometrial canal; favored endometrial polyp vs. neoplasm. b.) Bx 02/23/2021 --> no dysplasia or malignancy; benign.   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 IHC for cytokeratin AE1/AE3 and CD56 supports.   Osteoporosis of lumbar spine 03/12/2015   2021 - Prolia recommended by Oncology - to start once dental issues are resolved   Polyp of ascending colon    Polyp of descending colon    Polyp of sigmoid colon    Positive PPD, treated    Rectal polyp    T2DM (type 2 diabetes mellitus) (HCC) 06/2014   Valvular regurgitation 10/29/2020   a.) TTE 10/29/2020: EF >55%; trivial AR/TR/PR, mild MR    Surgical History: Past Surgical History:  Procedure Laterality Date   BIOPSY  11/11/2021   Procedure: BIOPSY;  Surgeon: Lemar Lofty., MD;  Location: [facility] ENDOSCOPY;  Service: Gastroenterology;;   BREAST  BIOPSY Left 07/31/2014   [facility] and DCIS    BREAST BIOPSY Left 03/08/2016   INTRADUCTAL PAPILLOMA    BREAST LUMPECTOMY Left 07/2014   [facility], DCIS, clear margins, positive LN   BREAST LUMPECTOMY Left 04/05/2016   excision of intraductal papilloma, no malignancy or atypia   BREAST LUMPECTOMY WITH NEEDLE LOCALIZATION Left 04/05/2016   Procedure: BREAST LUMPECTOMY WITH NEEDLE LOCALIZATION;  Surgeon: Gladis Riffle, MD;  Location: [facility] ORS;  Service: General;  Laterality: Left;   BREAST LUMPECTOMY WITH NEEDLE LOCALIZATION AND AXILLARY SENTINEL LYMPH NODE BX Left 08/27/14   CHOLECYSTECTOMY  09/07/2013    COLONOSCOPY WITH PROPOFOL N/A 08/28/2015   Procedure: COLONOSCOPY WITH PROPOFOL;  Surgeon: Midge Minium, MD;  Location: [facility];  Service: Endoscopy;  Laterality: N/A;  Diabetic oral   COLONOSCOPY WITH PROPOFOL N/A 08/14/2020   Procedure: COLONOSCOPY WITH PROPOFOL;  Surgeon: Pasty Spillers, MD;  Location: [facility] ENDOSCOPY;  Service: Endoscopy;  Laterality: N/A;   ENDOSCOPIC MUCOSAL RESECTION N/A 09/10/2020   Procedure: ENDOSCOPIC MUCOSAL RESECTION;  Surgeon: Meridee Score Netty Starring., MD;  Location: [facility] ENDOSCOPY;  Service: Gastroenterology;  Laterality: N/A;   ESOPHAGOGASTRODUODENOSCOPY  2014   gastritis   ESOPHAGOGASTRODUODENOSCOPY (EGD) WITH PROPOFOL N/A 04/16/2019   Procedure: ESOPHAGOGASTRODUODENOSCOPY (EGD) WITH PROPOFOL;  Surgeon: Pasty Spillers, MD;  Location: [facility] ENDOSCOPY;  Service: Endoscopy;  Laterality: N/A;   ESOPHAGOGASTRODUODENOSCOPY (EGD) WITH PROPOFOL N/A 10/03/2019   Procedure: ESOPHAGOGASTRODUODENOSCOPY (EGD) WITH PROPOFOL;  Surgeon: Pasty Spillers, MD;  Location: [facility] ENDOSCOPY;  Service: Endoscopy;  Laterality: N/A;   EUS N/A 09/10/2020   Procedure: LOWER ENDOSCOPIC ULTRASOUND (EUS);  Surgeon: Lemar Lofty., MD;  Location: [facility] ENDOSCOPY;  Service: Gastroenterology;  Laterality: N/A;   EUS N/A 11/11/2021   Procedure: LOWER ENDOSCOPIC ULTRASOUND (EUS);  Surgeon: Lemar Lofty., MD;  Location: [facility] ENDOSCOPY;  Service: Gastroenterology;  Laterality: N/A;   FLEXIBLE SIGMOIDOSCOPY N/A 08/27/2020   Procedure: FLEXIBLE SIGMOIDOSCOPY;  Surgeon: Pasty Spillers, MD;  Location: [facility] ENDOSCOPY;  Service: Endoscopy;  Laterality: N/A;   FLEXIBLE SIGMOIDOSCOPY N/A 09/10/2020   Procedure: FLEXIBLE SIGMOIDOSCOPY;  Surgeon: Meridee Score Netty Starring., MD;  Location: [facility] ENDOSCOPY;  Service: Gastroenterology;  Laterality: N/A;   FLEXIBLE SIGMOIDOSCOPY N/A 11/11/2021   Procedure: FLEXIBLE SIGMOIDOSCOPY;  Surgeon: Meridee Score Netty Starring., MD;  Location: [facility]  ENDOSCOPY;  Service: Gastroenterology;  Laterality: N/A;   FOREIGN BODY REMOVAL  09/10/2020   Procedure: FOREIGN BODY REMOVAL ;  Surgeon: Meridee Score Netty Starring., MD;  Location: [facility] ENDOSCOPY;  Service: Gastroenterology;;   HEMOSTASIS CLIP PLACEMENT  09/10/2020   Procedure: HEMOSTASIS CLIP PLACEMENT;  Surgeon: Lemar Lofty., MD;  Location: [facility] ENDOSCOPY;  Service: Gastroenterology;;   HYSTEROSCOPY WITH D & C N/A 03/25/2021   Procedure: DILATATION AND CURETTAGE Melton Krebs;  Surgeon: Vena Austria, MD;  Location: [facility] ORS;  Service: Gynecology;  Laterality: N/A;   POLYPECTOMY  08/28/2015   Procedure: POLYPECTOMY INTESTINAL;  Surgeon: Midge Minium, MD;  Location: [facility];  Service: Endoscopy;;  Rectal polyp   SUBMUCOSAL LIFTING INJECTION  09/10/2020   Procedure: SUBMUCOSAL LIFTING INJECTION;  Surgeon: Lemar Lofty., MD;  Location: [facility] ENDOSCOPY;  Service: Gastroenterology;;   TUBAL LIGATION Bilateral    UMBILICAL HERNIA REPAIR N/A 08/09/2016   Procedure: HERNIA REPAIR UMBILICAL ADULT;  Surgeon: Henrene Dodge, MD;  Location: [facility] OR[facility];  Service: General;  Laterality: N/A;   VENTRAL HERNIA REPAIR N/A 04/10/2015   Procedure: HERNIA REPAIR VENTRAL ADULT;  Surgeon: Nadeen Landau, MD;  Location: [facility];  Service: General;  Laterality: N/A;    Home Medications:  Allergies as of 01/02/2023   No Known Allergies      Medication List        Accurate as of January 02, 2023 12:07 PM. If you have any questions, ask your nurse or doctor.          acetaminophen 500 MG tablet Commonly known as: TYLENOL Take 1,000 mg by mouth every 6 (six) hours as needed for moderate pain.   albuterol 108 (90 Base) MCG/ACT inhaler Commonly known as: VENTOLIN HFA INHALE 2 PUFFS INTO THE LUNGS EVERY 6 HOURS AS NEEDED FOR WHEEZING OR SHORTNESS OF BREATH   amLODipine 10 MG tablet Commonly known as: NORVASC TAKE 1 TABLET(10 MG) BY MOUTH DAILY   aspirin EC 81 MG tablet Take 81 mg  by mouth daily.   atorvastatin 10 MG tablet Commonly known as: LIPITOR Take 1 tablet (10 mg total) by mouth at bedtime.   glucose blood test strip Test BID   irbesartan 300 MG tablet Commonly known as: AVAPRO TAKE 1 TABLET(300 MG) BY MOUTH DAILY   metFORMIN 500 MG 24 hr tablet Commonly known as: GLUCOPHAGE-XR TAKE 1 TABLET BY MOUTH EVERY MORNING WITH BREAKFAST   methocarbamol 500 MG tablet Commonly known as: ROBAXIN Take 500 mg by mouth at bedtime.   multivitamin with minerals tablet Take 1 tablet by mouth daily.   phenazopyridine 95 MG tablet Commonly known as: PYRIDIUM Take 1 tablet (95 mg total) by mouth 3 (three) times daily as needed for pain.   triamterene-hydrochlorothiazide 37.5-25 MG tablet Commonly known as: MAXZIDE-25 TAKE 1 TABLET BY MOUTH DAILY   True Metrix Level 1 Low Soln 1 each by In Vitro route daily as needed.   True Metrix Meter Devi 1 each by Does not apply route daily.        Family History: Family History  Problem Relation Age of Onset   Cancer Mother 20       Pancreatic   Cancer Father        Throat dx 21s   Healthy Sister    Cancer Sister 66       unk metastatic   Kidney cancer Sister 32   HIV Brother    Breast cancer Maternal Grandmother 64   Breast cancer Cousin        d. 55s    Social History:  reports that she has been smoking cigarettes. She has a 22.5 pack-year smoking history. She has never used smokeless tobacco. She reports current alcohol use of about 2.0 standard drinks of alcohol per week. She reports that she does not use drugs.   Physical Exam: BP (!) 148/77   Pulse 94   Wt 202 lb (91.6 kg)   BMI 30.71 kg/m   Constitutional:  Alert and oriented, No acute distress. HEENT: [place] AT Respiratory: Normal respiratory effort, no increased work of breathing. Psychiatric: Normal mood and affect.   Pertinent Imaging: CT scans of 12/06/22, 07/08/22, and 09/30/20 were personally reviewed and interpreted.  Narrative &  Impression  CLINICAL DATA:  Carcinoid of rectum. Surgery April 2022 with 1 mm margins. History of breast cancer.   EXAM: CT ABDOMEN AND PELVIS WITH CONTRAST   TECHNIQUE: Multidetector CT imaging of the abdomen and pelvis was performed using the standard protocol following bolus administration of intravenous contrast.   CONTRAST:  OMNIPAQUE IOHEXOL 300 MG/ML  SOLN   COMPARISON:  February 16, 2016   FINDINGS: Lower chest: No acute abnormality.   Hepatobiliary:  Hepatic steatosis. Previous cholecystectomy. The portal vein is patent.   Pancreas: Unremarkable. No pancreatic ductal dilatation or surrounding inflammatory changes.   Spleen: Normal in size without focal abnormality.   Adrenals/Urinary Tract: Adrenal glands are unremarkable. Kidneys are normal, without renal calculi, focal lesion, or hydronephrosis. Bladder is unremarkable.   Stomach/Bowel: The stomach and small bowel are normal. Postoperative changes with sutures are seen near the rectosigmoid junction. The colon is normal. The appendix is unremarkable.   Vascular/Lymphatic: Mild calcified atherosclerosis is seen in the nonaneurysmal aorta. No adenopathy.   Reproductive: Uterus and bilateral adnexa are unremarkable.   Other: No abdominal wall hernia or abnormality. No abdominopelvic ascites.   Musculoskeletal: No acute or significant osseous findings.   IMPRESSION: 1. Sutures near the rectosigmoid junction consistent with history. 2. Hepatic steatosis. 3. Mild calcified atherosclerosis in the nonaneurysmal aorta.     Electronically Signed   By: Gerome Sam III M.D   On: 10/03/2020 06:34      Results for orders placed during the hospital encounter of 07/18/22  CT Renal Stone Study  Narrative CLINICAL DATA:  Flank pain  EXAM: CT ABDOMEN AND PELVIS WITHOUT CONTRAST  TECHNIQUE: Multidetector CT imaging of the abdomen and pelvis was performed following the standard protocol without IV  contrast.  RADIATION DOSE REDUCTION: This exam was performed according to the departmental dose-optimization program which includes automated exposure control, adjustment of the mA and/or kV according to patient size and/or use of iterative reconstruction technique.  COMPARISON:  CT 07/08/2022  FINDINGS: Lower chest: No acute abnormality.  Hepatobiliary: Hepatic steatosis. Status post cholecystectomy. No biliary dilatation  Pancreas: Unremarkable. No pancreatic ductal dilatation or surrounding inflammatory changes.  Spleen: Normal in size without focal abnormality.  Adrenals/Urinary Tract: Adrenal glands are unremarkable. Kidneys are normal, without renal calculi, focal lesion, or hydronephrosis. Bladder is unremarkable.  Stomach/Bowel: Stomach is within normal limits. Appendix appears normal. No evidence of bowel wall thickening, distention, or inflammatory changes.  Vascular/Lymphatic: Mild aortic atherosclerosis. No aneurysm. No suspicious lymph nodes.  Reproductive: Uterus and bilateral adnexa are unremarkable.  Other: No abdominal wall hernia or abnormality. No abdominopelvic ascites.  Musculoskeletal: No acute or significant osseous findings.  IMPRESSION: 1. No CT evidence for acute intra-abdominal or pelvic abnormality. Hepatic steatosis. 2. Aortic atherosclerosis.  Aortic Atherosclerosis (ICD10-I70.0).   Electronically Signed By: Jasmine Pang M.D. On: 07/18/2022 19:18   Narrative & Impression  CLINICAL DATA:  Neuroendocrine carcinoma of rectum and history of breast cancer; * Tracking Code: BO *   EXAM: CT ABDOMEN AND PELVIS WITH CONTRAST   TECHNIQUE: Multidetector CT imaging of the abdomen and pelvis was performed using the standard protocol following bolus administration of intravenous contrast.   RADIATION DOSE REDUCTION: This exam was performed according to the departmental dose-optimization program which includes automated exposure  control, adjustment of the mA and/or kV according to patient size and/or use of iterative reconstruction technique.   CONTRAST:  OMNIPAQUE IOHEXOL 300 MG/ML  SOLN   COMPARISON:  CT abdomen and pelvis dated July 08, 2018 for   FINDINGS: Lower chest: No acute abnormality.   Hepatobiliary: No focal liver abnormality is seen. Status post cholecystectomy. No biliary dilatation.   Pancreas: Unremarkable. No pancreatic ductal dilatation or surrounding inflammatory changes.   Spleen: Normal in size without focal abnormality.   Adrenals/Urinary Tract: Bilateral adrenal glands are unremarkable. No hydronephrosis or nephrolithiasis. Enhancing lesion of the lower pole of the left kidney measuring 2.1 x 1.8 x 2.4 cm, previously measured 1.7  x 1.5 x 1.8 cm. Bladder is unremarkable.   Stomach/Bowel: Small hiatal hernia. Appendix appears normal. No evidence of bowel wall thickening, distention, or inflammatory changes.   Vascular/Lymphatic: Aortic atherosclerosis. No enlarged abdominal or pelvic lymph nodes.   Reproductive: Uterus and bilateral adnexa are unremarkable.   Other: No abdominal wall hernia or abnormality. No abdominopelvic ascites.   Musculoskeletal: No acute or significant osseous findings.   IMPRESSION: 1. Enhancing lesion of the lower pole of the left kidney measuring 2.4 cm increased in size when compared with the prior and, highly concerning for renal cell carcinoma. 2. No evidence of metastatic disease in the abdomen or pelvis. 3. Aortic Atherosclerosis (ICD10-I70.0).     Electronically Signed   By: Allegra Lai M.D.   On: 12/07/2022 12:17     Assessment & Plan:    1. Left renal mass Enhancing left renal mass and we discussed the small renal masses have a 75% chance of being renal cell carcinoma. The mass has increased in size between February 2024 and July 2024, and would recommend treatment. We discussed laparoscopic mass excision/partial  nephrectomy and percutaneous ablative therapy by interventional radiology. She is interested in pursuing percutaneous ablation by radiology and IR consult was placed.  I have reviewed the above documentation for accuracy and completeness, and I agree with the above.   Donna Altes, MD  [facility] [address], [address] [place], [place] [address] [phone]

## 2023-01-03 ENCOUNTER — Other Ambulatory Visit: Payer: Self-pay | Admitting: Urology

## 2023-01-03 ENCOUNTER — Ambulatory Visit
Admission: RE | Admit: 2023-01-03 | Discharge: 2023-01-03 | Disposition: A | Discharge status: Home / Self Care | Payer: 59 | Source: Ambulatory Visit | Attending: Urology | Admitting: Urology

## 2023-01-03 DIAGNOSIS — N2889 Other specified disorders of kidney and ureter: Secondary | ICD-10-CM | POA: Diagnosis not present

## 2023-01-03 HISTORY — PX: IR RADIOLOGIST EVAL & MGMT: IMG5224

## 2023-01-03 NOTE — H&P (Signed)
Interventional Radiology - Clinic Visit, Initial H&P    Referring Provider: Riki Altes, MD  Reason for Visit: Renal mass     History of Present Illness  Donna Riley is a 62 y.o. 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  The patient reports undergoing routine CT surveillance for her cancer history on December 06, 2022.  The scan demonstrated a new/enlarging renal mass in the left renal lower pole measuring up to 2.4 cm, previously 1.7 cm on previous imaging in February 2024 when it was less conspicuous.  The patient denies any symptoms such as flank pain or gross hematuria.  The patient was evaluated by the referring urologist Dr. Lonna Cobb in his clinic yesterday, and discussed treatment options including surgical resection and percutaneous ablation. is interested in percutaneous ablation.    Additional Past Medical History Past Medical History:  Diagnosis Date   Abdominal bloating    Acute midline low back pain without sciatica 10/09/2018   Arthritis    shoulders and neck   Breast cancer, left (HCC) 08/04/2014   a.) Bx (+) for invasive mammary carcinoma with DCIS; ER/PR (+), HER2/neu (-). (+) lymphovascular invasion with 1 of 2 lymph nodes being (+). Treated with lumpectomy + adjuvant chemoradiation.   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   Diastolic dysfunction 10/29/2020   a.)  TTE 10/29/2020: EF >55%; G1DD   DOE (dyspnea on exertion)    Elevated LFTs    Endometrial polyp 02/03/2021   a.) Pelvic US --> 18 x 5 x 10 mm lenticular nodule in endrometrial canal; favored endometrial polyp vs. neoplasm. b.) Bx 02/23/2021 --> no dysplasia or malignancy;  benign.   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 IHC for cytokeratin AE1/AE3 and CD56 supports.   Osteoporosis of lumbar spine 03/12/2015   2021 - Prolia recommended by Oncology - to start once dental issues are resolved   Polyp of ascending colon    Polyp of descending colon    Polyp of sigmoid colon    Positive PPD, treated    Rectal polyp    T2DM (type 2 diabetes mellitus) (HCC) 06/2014   Valvular regurgitation 10/29/2020   a.) TTE 10/29/2020: EF >55%; trivial AR/TR/PR, mild MR     Surgical History  Past Surgical History:  Procedure Laterality Date   BIOPSY  11/11/2021   Procedure: BIOPSY;  Surgeon: Lemar Lofty., MD;  Location: [facility] ENDOSCOPY;  Service: Gastroenterology;;   BREAST BIOPSY Left 07/31/2014   [facility] and DCIS    BREAST BIOPSY Left 03/08/2016   INTRADUCTAL PAPILLOMA    BREAST LUMPECTOMY Left 07/2014   [facility], DCIS, clear margins, positive LN   BREAST LUMPECTOMY Left 04/05/2016  excision of intraductal papilloma, no malignancy or atypia   BREAST LUMPECTOMY WITH NEEDLE LOCALIZATION Left 04/05/2016   Procedure: BREAST LUMPECTOMY WITH NEEDLE LOCALIZATION;  Surgeon: Gladis Riffle, MD;  Location: [facility] ORS;  Service: General;  Laterality: Left;   BREAST LUMPECTOMY WITH NEEDLE LOCALIZATION AND AXILLARY SENTINEL LYMPH NODE BX Left 08/27/14   CHOLECYSTECTOMY  09/07/2013   COLONOSCOPY WITH PROPOFOL N/A 08/28/2015   Procedure: COLONOSCOPY WITH PROPOFOL;  Surgeon: Midge Minium, MD;  Location: [facility] SURGERY CNTR;  Service: Endoscopy;  Laterality: N/A;  Diabetic  oral   COLONOSCOPY WITH PROPOFOL N/A 08/14/2020   Procedure: COLONOSCOPY WITH PROPOFOL;  Surgeon: Pasty Spillers, MD;  Location: [facility] ENDOSCOPY;  Service: Endoscopy;  Laterality: N/A;   ENDOSCOPIC MUCOSAL RESECTION N/A 09/10/2020   Procedure: ENDOSCOPIC MUCOSAL RESECTION;  Surgeon: Meridee Score Netty Starring., MD;  Location: [facility] ENDOSCOPY;  Service: Gastroenterology;  Laterality: N/A;   ESOPHAGOGASTRODUODENOSCOPY  2014   gastritis   ESOPHAGOGASTRODUODENOSCOPY (EGD) WITH PROPOFOL N/A 04/16/2019   Procedure: ESOPHAGOGASTRODUODENOSCOPY (EGD) WITH PROPOFOL;  Surgeon: Pasty Spillers, MD;  Location: [facility] ENDOSCOPY;  Service: Endoscopy;  Laterality: N/A;   ESOPHAGOGASTRODUODENOSCOPY (EGD) WITH PROPOFOL N/A 10/03/2019   Procedure: ESOPHAGOGASTRODUODENOSCOPY (EGD) WITH PROPOFOL;  Surgeon: Pasty Spillers, MD;  Location: [facility] ENDOSCOPY;  Service: Endoscopy;  Laterality: N/A;   EUS N/A 09/10/2020   Procedure: LOWER ENDOSCOPIC ULTRASOUND (EUS);  Surgeon: Lemar Lofty., MD;  Location: [facility] ENDOSCOPY;  Service: Gastroenterology;  Laterality: N/A;   EUS N/A 11/11/2021   Procedure: LOWER ENDOSCOPIC ULTRASOUND (EUS);  Surgeon: Lemar Lofty., MD;  Location: [facility] ENDOSCOPY;  Service: Gastroenterology;  Laterality: N/A;   FLEXIBLE SIGMOIDOSCOPY N/A 08/27/2020   Procedure: FLEXIBLE SIGMOIDOSCOPY;  Surgeon: Pasty Spillers, MD;  Location: [facility] ENDOSCOPY;  Service: Endoscopy;  Laterality: N/A;   FLEXIBLE SIGMOIDOSCOPY N/A 09/10/2020   Procedure: FLEXIBLE SIGMOIDOSCOPY;  Surgeon: Meridee Score Netty Starring., MD;  Location: [facility] ENDOSCOPY;  Service: Gastroenterology;  Laterality: N/A;   FLEXIBLE SIGMOIDOSCOPY N/A 11/11/2021   Procedure: FLEXIBLE SIGMOIDOSCOPY;  Surgeon: Meridee Score Netty Starring., MD;  Location: [facility] ENDOSCOPY;  Service: Gastroenterology;  Laterality: N/A;   FOREIGN BODY REMOVAL  09/10/2020   Procedure: FOREIGN BODY REMOVAL ;  Surgeon: Meridee Score Netty Starring., MD;  Location: [facility] ENDOSCOPY;   Service: Gastroenterology;;   HEMOSTASIS CLIP PLACEMENT  09/10/2020   Procedure: HEMOSTASIS CLIP PLACEMENT;  Surgeon: Lemar Lofty., MD;  Location: [facility] ENDOSCOPY;  Service: Gastroenterology;;   HYSTEROSCOPY WITH D & C N/A 03/25/2021   Procedure: DILATATION AND CURETTAGE Melton Krebs;  Surgeon: Vena Austria, MD;  Location: [facility] ORS;  Service: Gynecology;  Laterality: N/A;   POLYPECTOMY  08/28/2015   Procedure: POLYPECTOMY INTESTINAL;  Surgeon: Midge Minium, MD;  Location: [facility] SURGERY [facility];  Service: Endoscopy;;  Rectal polyp   SUBMUCOSAL LIFTING INJECTION  09/10/2020   Procedure: SUBMUCOSAL LIFTING INJECTION;  Surgeon: Lemar Lofty., MD;  Location: [facility] ENDOSCOPY;  Service: Gastroenterology;;   TUBAL LIGATION Bilateral    UMBILICAL HERNIA REPAIR N/A 08/09/2016   Procedure: HERNIA REPAIR UMBILICAL ADULT;  Surgeon: Henrene Dodge, MD;  Location: [facility] ORS;  Service: General;  Laterality: N/A;   VENTRAL HERNIA REPAIR N/A 04/10/2015   Procedure: HERNIA REPAIR VENTRAL ADULT;  Surgeon: Nadeen Landau, MD;  Location: [facility] ORS;  Service: General;  Laterality: N/A;     Medications  I have reviewed the current medication list. Refer to chart for details. Current Outpatient Medications  Medication Instructions   acetaminophen (TYLENOL) 1,000 mg, Oral, Every 6 hours PRN   albuterol (VENTOLIN  HFA) 108 (90 Base) MCG/ACT inhaler 2 puffs, Inhalation, Every 6 hours PRN   amLODipine (NORVASC) 10 MG tablet TAKE 1 TABLET(10 MG) BY MOUTH DAILY   aspirin EC 81 mg, Oral, Daily   atorvastatin (LIPITOR) 10 mg, Oral, Daily at bedtime   Blood Glucose Calibration (TRUE METRIX LEVEL 1) Low SOLN 1 each, In Vitro, Daily PRN   Blood Glucose Monitoring Suppl (TRUE METRIX METER) DEVI 1 each, Does not apply, Daily   glucose blood test strip Test BID   irbesartan (AVAPRO) 300 MG tablet TAKE 1 TABLET(300 MG) BY MOUTH DAILY   metFORMIN (GLUCOPHAGE-XR) 500 MG 24 hr tablet TAKE 1 TABLET BY MOUTH EVERY  MORNING WITH BREAKFAST   methocarbamol (ROBAXIN) 500 mg, Oral, Daily at bedtime   Multiple Vitamins-Minerals (MULTIVITAMIN WITH MINERALS) tablet 1 tablet, Oral, Daily   phenazopyridine (PYRIDIUM) 95 mg, Oral, 3 times daily PRN   triamterene-hydrochlorothiazide (MAXZIDE-25) 37.5-25 MG tablet 1 tablet, Oral, Daily      Allergies No Known Allergies Does patient have contrast allergy: No     Physical Exam Current Vitals Temp: 98.4 F (36.9 C) (Temp Source: Oral)  Pulse Rate: 81  Resp: 14  BP: (!) 166/71  SpO2: 97 %        There is no height or weight on file to calculate BMI.  General: Alert and answers questions appropriately. No apparent distress. HEENT: Normocephalic, atraumatic. Conjunctivae normal without scleral icterus. Cardiac: Regular rate and rhythm. No dependent edema. Pulmonary: Normal work of breathing. On room air. Abdominal: Soft without distension. Extremities: Normally-formed, well perfused.    Pertinent Lab Results    Latest Ref Rng & Units 09/13/2022   10:14 AM 07/18/2022    4:47 PM 06/29/2022    8:44 AM  CBC  WBC 4.0 - 10.5 K/uL 9.4  8.0  7.8   Hemoglobin 12.0 - 15.0 g/dL 40.9  81.1  91.4   Hematocrit 36.0 - 46.0 % 44.1  44.6  44.0   Platelets 150 - 400 K/uL 258  212  241       Latest Ref Rng & Units 12/06/2022    3:50 PM 09/13/2022   10:14 AM 07/18/2022    4:47 PM  CMP  Glucose 70 - 99 mg/dL  782  956   BUN 8 - 23 mg/dL  20  13   Creatinine 2.13 - 1.00 mg/dL 0.86  5.78  4.69   Sodium 135 - 145 mmol/L  135  134   Potassium 3.5 - 5.1 mmol/L  3.8  3.5   Chloride 98 - 111 mmol/L  103  102   CO2 22 - 32 mmol/L  23  22   Calcium 8.9 - 10.3 mg/dL  9.3  8.7   Total Protein 6.5 - 8.1 g/dL  7.9  7.6   Total Bilirubin 0.3 - 1.2 mg/dL  0.6  0.4   Alkaline Phos 38 - 126 U/L  93  111   AST 15 - 41 U/L  26  69   ALT 0 - 44 U/L  24  68       Relevant and/or Recent Imaging: CT A/P 12/06/2022  IMPRESSION: 1. Enhancing lesion of the lower pole of the left  kidney measuring 2.4 cm increased in size when compared with the prior and, highly concerning for renal cell carcinoma. 2. No evidence of metastatic disease in the abdomen or pelvis. 3. Aortic Atherosclerosis (ICD10-I70.0). Electronically Signed By: Allegra Lai M.D. On: 12/07/2022 12:17    Assessment & Plan  Donna Riley is a 62 y.o. female with a history of breast cancer and NET who was referred to IR Clinic by Dr. Maren Reamer in consultation for further evaluation and management of enlarging left renal mass.  Imaging demonstrates enlarging endophytic mass in the left renal lower pole measuring up to 2.4 cm on today's exam, compatible with RCC. She is an appropriate candidate for treatment, and is most interested in percutaneous ablation. Based on its more endophytic location proximity to the lower pole collecting system, I believe a cryoablation would be most appropriate. Risks of the procedure including but not limited to bleeding and urinary leak were discussed. She is agreeable to proceed.   Risk stratification: ASA Classification: ASA 2 - Patient with mild systemic disease with no functional limitations    Pre-procedure planning:  Post-procedure disposition: outpatient [facility]   Sedation plan: general anesthesia Medication holds: Aspirin   Positioning/access site: CT prone     Total time spent on today's visit was over 60 Minutes, including both face-to-face time and non face-to-face time, personally spent on review of chart (including labs and relevant imaging), discussing further workup and treatment options, referral to specialist if needed, reviewing outside records if pertinent, answering patient questions, and coordinating care regarding left renal mass as well as management strategy.      Olive Bass, MD  Vascular and Interventional Radiology 01/03/2023 10:49 AM

## 2023-01-04 ENCOUNTER — Other Ambulatory Visit: Payer: Self-pay | Admitting: Interventional Radiology

## 2023-01-04 ENCOUNTER — Telehealth: Payer: Self-pay | Admitting: Urology

## 2023-01-04 DIAGNOSIS — N2889 Other specified disorders of kidney and ureter: Secondary | ICD-10-CM

## 2023-01-04 NOTE — Telephone Encounter (Signed)
Please let patient know her urinalysis on Monday did show microscopic blood  and recommend scheduling office cystoscopy to make sure there is not any evidence of a bladder tumor.

## 2023-01-04 NOTE — Telephone Encounter (Signed)
Notified patient as instructed, patient pleased. Discussed follow-up appointments, patient agrees  

## 2023-01-10 ENCOUNTER — Other Ambulatory Visit: Payer: Self-pay

## 2023-01-10 ENCOUNTER — Encounter
Admission: RE | Admit: 2023-01-10 | Discharge: 2023-01-10 | Disposition: A | Discharge status: Home / Self Care | Payer: 59 | Source: Ambulatory Visit | Attending: Anesthesiology | Admitting: Anesthesiology

## 2023-01-10 DIAGNOSIS — N2889 Other specified disorders of kidney and ureter: Secondary | ICD-10-CM

## 2023-01-10 DIAGNOSIS — Z01812 Encounter for preprocedural laboratory examination: Secondary | ICD-10-CM

## 2023-01-10 HISTORY — DX: Dyspnea, unspecified: R06.00

## 2023-01-10 NOTE — Patient Instructions (Addendum)
Your procedure is scheduled on: 01/13/2023   Friday  Report to the Registration Desk on the 1st floor of the [facility]. To find out your arrival time, please call [phone] between 1PM - 3PM on: 01/12/2023 .  Please follow instructions given to you. If your arrival time is 6:00 am, do not arrive before that time as the [facility] entrance doors do not open until 6:00 am.  REMEMBER: Instructions that are not followed completely may result in serious medical risk, up to and including death; or upon the discretion of your surgeon and anesthesiologist your surgery may need to be rescheduled.  Do not eat food after midnight the night before surgery.  No gum chewing or hard candies.   One week prior to surgery: Stop Anti-inflammatories (NSAIDS) such as Advil, Aleve, Ibuprofen, Motrin, Naproxen, Naprosyn and Aspirin based products such as Excedrin, Goody's Powder, BC Powder. Stop ANY OVER THE COUNTER supplements until after surgery. You may however, continue to take Tylenol if needed for pain up until the day of surgery.  Continue taking all prescribed medications with the exception of the following:   Aspirin- please hold aspirin 5 days prior to surgery.  Last dose should be January 08, 2023 .    Follow recommendations from Cardiologist or PCP regarding stopping blood thinners :         Hold metformin 2 days prior to surgery. Last dose should be today.      TAKE ONLY THESE MEDICATIONS THE MORNING OF SURGERY WITH A SIP OF WATER:  amlodipine   Use inhalers on the day of surgery and bring to the hospital.  No Alcohol for 24 hours before or after surgery.  No Smoking including e-cigarettes for 24 hours before surgery.  No chewable tobacco products for at least 6 hours before surgery.  No nicotine patches on the day of surgery.  Do not use any "recreational" drugs for at least a week (preferably 2 weeks) before your surgery.  Please be advised that the combination of cocaine and  anesthesia may have negative outcomes, up to and including death. If you test positive for cocaine, your surgery will be cancelled.  On the morning of surgery brush your teeth with toothpaste and water, you may rinse your mouth with mouthwash if you wish. Do not swallow any toothpaste or mouthwash.   You need to shower on day of surgery.   Do not wear jewelry, make-up, hairpins, clips or nail polish.  Do not wear lotions, powders, or perfumes.   Do not shave body hair from the neck down 48 hours before surgery.  Contact lenses, hearing aids and dentures may not be worn into surgery.  Do not bring valuables to the hospital. [facility] is not responsible for any missing/lost belongings or valuables.   Notify your doctor if there is any change in your medical condition (cold, fever, infection).  Wear comfortable clothing (specific to your surgery type) to the hospital.  After surgery, you can help prevent lung complications by doing breathing exercises.  Take deep breaths and cough every 1-2 hours. Your doctor may order a device called an Incentive Spirometer to help you take deep breaths.  If you are being admitted to the hospital overnight, leave your suitcase in the car. After surgery it may be brought to your room.  If you are being discharged the day of surgery, you will not be allowed to drive home. You will need a responsible individual to drive you home and stay  with you for 24 hours after surgery.    Please call the Pre-admissions Testing Dept. at [phone] if you have any questions about these instructions.  Surgery Visitation Policy:  Patients having surgery or a procedure may have two visitors.  Children under the age of 4 must have an adult with them who is not the patient.

## 2023-01-10 NOTE — Pre-Procedure Instructions (Signed)
Pre-admission interview completed. Patient instructions given to patient and instructed to pick up the printed instructions as well. .Patient verbalized understanding.

## 2023-01-10 NOTE — Progress Notes (Signed)
Perioperative / Anesthesia Services  Pre-Admission Testing Clinical Review / Preoperative Anesthesia Consult  Date: 01/11/23  Patient Demographics:  Name: Donna Riley DOB:   1960-07-08 MRN:   [id number]  Planned Surgical Procedure(s):    Date/Time: 01/13/23 1000   Procedure: CT RENAL TUMOR ABLATION UNILATERAL   Diagnosis: Kidney mass [N28.89]   Indications: Left renal cryoablation   Location: [facility]     NOTE: Available PAT nursing documentation and vital signs have been reviewed. Clinical nursing staff has updated patient's PMH/PSHx, current medication list, and drug allergies/intolerances to ensure comprehensive history available to assist in medical decision making as it pertains to the aforementioned surgical procedure and anticipated anesthetic course. Extensive review of available clinical information personally performed. [place] PMH and PSHx updated with any diagnoses/procedures that  may have been inadvertently omitted during her intake with the pre-admission testing department's nursing staff.  Clinical Discussion:  Donna Riley is a 62 y.o. female who is submitted for pre-surgical anesthesia review and clearance prior to her undergoing the above procedure. Patient is a Current Smoker (22.5 pack years). Pertinent PMH includes: CAD, diastolic dysfunction, PVD with intermittent claudication, aortic atherosclerosis, HTN, HLD, T2DM, DOE, GERD (no daily Tx), neuroendocrine tumor, LEFT breast cancer, LEFT renal mass, OA.   Patient is followed by cardiology Juliann Pares, MD). She was last seen in the cardiology clinic on 12/16/2022; notes reviewed. At the time of her clinic visit, patient doing well overall from a cardiovascular perspective.  Patient reporting episodes of what she describes as "twinges" of pain in her chest that lasts for only a few seconds before resolving.  She denied any episodes of further chest pain, shortness breath, PND,  orthopnea, palpitations, significant peripheral edema, weakness, fatigue, vertiginous symptoms, or presyncope/syncope. Patient with a past medical history significant for cardiovascular diagnoses. Documented physical exam was grossly benign, providing no evidence of acute exacerbation and/or decompensation of the patient's known cardiovascular conditions.  TTE performed on 10/29/2020 revealed normal left ventricular systolic function with an EF of >55%.  There were no regional wall motion abnormalities.  Left ventricular diastolic Doppler parameters normal.  Right ventricular size and function normal.  Trivial aortic, tricuspid, and pulmonary, in addition to mild mitral valve regurgitation.  There was no evidence of a significant transvalvular gradient to suggest stenosis.  Aorta normal in size with no evidence of aneurysmal dilatation.  Myocardial perfusion imaging study performed on 11/23/2020 revealed a normal left ventricular systolic function with an EF of 51%.  There were no regional wall motion abnormalities.  There was no evidence of stress-induced myocardial ischemia or arrhythmia.  Study determined to be normal and low risk.  Blood pressure reasonably controlled at 13478 mmHg on currently prescribed CCB (amlodipine), ARB (irbesartan), and diuretic (triamterene/HCTZ) therapies.  Patient is on atorvastatin for her HLD diagnosis and ASCVD prevention.  T2DM is well-controlled on currently prescribed regimen; HgbA1c was 6.1% when checked on 12/19/2022.  Patient does not have an OSAH diagnosis.  Patient making efforts to remain active.  Patient is able to complete all of her  ADL/IADLs without cardiovascular limitation.  Per the DASI, patient is able to achieve at least 4 METS of physical activity without experiencing any significant degree of angina/anginal equivalent symptoms.  No changes were made to her medication regimen.  Patient follow-up with outpatient cardiology in 9 months or sooner if  needed.  Donna Riley is scheduled for CT RENAL TUMOR ABLATION UNILATERAL on 01/13/2023 with Dr. Elijio Miles El Abd,  MD.  Given patient's past medical history significant for cardiovascular diagnoses, presurgical cardiac clearance was sought by the PAT team. Per cardiology, "this patient is optimized for surgery and may proceed with the planned procedural course with a ACCEPTABLE risk of significant perioperative cardiovascular complications".   In review of her medication reconciliation, it is noted that patient is currently on prescribed daily antithrombotic therapy. She has been instructed on recommendations for holding her daily for 5 days prior to her procedure with plans to restart as soon as postoperative bleeding risk felt to be minimized by her attending surgeon. The patient has been instructed that her last dose of her ASA should be on 01/07/2023.  Patient denies previous perioperative complications with anesthesia in the past. In review of the available records, it is noted that patient underwent a MAC general anesthetic course at [facility] ([facility]) in 10/2021 without documented complications.      01/03/2023    9:21 AM 01/02/2023   10:23 AM 12/19/2022   10:50 AM  Vitals with BMI  Height   5\' 8"   Weight  202 lbs 202 lbs  BMI  30.72 30.72  Systolic 166 148 409  Diastolic 71 77 70  Pulse 81 94 88    Providers/Specialists:   NOTE: Primary physician provider listed below. Patient may have been seen by APP or partner within same practice.   PROVIDER ROLE / SPECIALTY LAST OV  Pernell Dupre, MD Interventional Radiology  01/03/2023  Reubin Milan, MD Primary Care Provider 12/19/2022  Rudean Hitt, MD Cardiology 12/16/2022  Irineo Axon, MD Urology 01/02/2023  [name], MD Medical Oncology 09/13/2022   Allergies:  Patient has no known allergies.  Current Home Medications:    acetaminophen (TYLENOL) 500 MG tablet   albuterol (VENTOLIN HFA) 108 (90 Base) MCG/ACT  inhaler   amLODipine (NORVASC) 10 MG tablet   aspirin EC 81 MG tablet   atorvastatin (LIPITOR) 10 MG tablet   Blood Glucose Calibration (TRUE METRIX LEVEL 1) Low SOLN   Blood Glucose Monitoring Suppl (TRUE METRIX METER) DEVI   glucose blood test strip   irbesartan (AVAPRO) 300 MG tablet   metFORMIN (GLUCOPHAGE-XR) 500 MG 24 hr tablet   methocarbamol (ROBAXIN) 500 MG tablet   Multiple Vitamins-Minerals (MULTIVITAMIN WITH MINERALS) tablet   phenazopyridine (PYRIDIUM) 95 MG tablet   triamterene-hydrochlorothiazide (MAXZIDE-25) 37.5-25 MG tablet   No current facility-administered medications for this encounter.   History:   Past Medical History:  Diagnosis Date   Abdominal bloating    Acute midline low back pain without sciatica 10/09/2018   Aortic atherosclerosis (HCC)    Arthritis    Breast cancer, left (HCC) 08/04/2014   a.) Bx (+) for invasive mammary carcinoma with DCIS; ER/PR (+), HER2/neu (-). (+) lymphovascular invasion with 1 of 2 lymph nodes being (+). Treated with lumpectomy + adjuvant chemoradiation.   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 neoplasm. b.) Bx 02/23/2021 --> no dysplasia or malignancy;  benign.   Family history of breast cancer    Family history of kidney cancer    Family history of pancreatic cancer    Family history of throat cancer    Fatigue 08/01/2016   GERD (gastroesophageal reflux disease)    occ-no meds   Gout    Helicobacter pylori gastritis 06/03/2019   Hydradenitis  01/23/2015   Hyperlipidemia    Hypertension    Left renal mass 12/06/2022   a.) CT AP 12/06/2022 --> 2.4 cm enhancing lesion of the lower pole of the LEFT kidney   Mastitis 10/15/2016   Neuroendocrine tumor 08/14/2020   a.) Rectal mass Bx (+) for well differentiated NET (grade I). IHC for cytokeratin AE1/AE3 and CD56 supports.   Osteoporosis of lumbar spine 03/12/2015   2021 - Prolia recommended by Oncology - to start once dental issues are resolved   Polyp of ascending colon    Polyp of descending colon    Polyp of sigmoid colon    Positive PPD, treated    PVD (peripheral vascular disease) with claudication (HCC)    Rectal polyp    T2DM (type 2 diabetes mellitus) (HCC) 06/2014   Past Surgical History:  Procedure Laterality Date   BIOPSY  11/11/2021   Procedure: BIOPSY;  Surgeon: Lemar Lofty., MD;  Location: [facility] ENDOSCOPY;  Service: Gastroenterology;;   BREAST BIOPSY Left 07/31/2014   IMC and DCIS    BREAST BIOPSY Left 03/08/2016   INTRADUCTAL PAPILLOMA    BREAST LUMPECTOMY Left 07/2014   IDC, DCIS, clear margins, positive LN   BREAST LUMPECTOMY Left 04/05/2016   excision of intraductal papilloma, no malignancy or atypia   BREAST LUMPECTOMY WITH NEEDLE LOCALIZATION Left 04/05/2016   Procedure: BREAST LUMPECTOMY WITH NEEDLE LOCALIZATION;  Surgeon: Gladis Riffle, MD;  Location: [facility] ORS;  Service: General;  Laterality: Left;   BREAST LUMPECTOMY WITH NEEDLE LOCALIZATION AND AXILLARY SENTINEL LYMPH NODE BX Left 08/27/14   CHOLECYSTECTOMY  09/07/2013   COLONOSCOPY WITH PROPOFOL N/A 08/28/2015   Procedure: COLONOSCOPY WITH PROPOFOL;  Surgeon: Midge Minium, MD;  Location: [facility];  Service: Endoscopy;  Laterality: N/A;  Diabetic oral   COLONOSCOPY WITH PROPOFOL N/A 08/14/2020   Procedure: COLONOSCOPY WITH PROPOFOL;  Surgeon: Pasty Spillers, MD;  Location: [facility] ENDOSCOPY;  Service: Endoscopy;  Laterality: N/A;   ENDOSCOPIC MUCOSAL RESECTION N/A 09/10/2020    Procedure: ENDOSCOPIC MUCOSAL RESECTION;  Surgeon: Meridee Score Netty Starring., MD;  Location: [facility] ENDOSCOPY;  Service: Gastroenterology;  Laterality: N/A;   ESOPHAGOGASTRODUODENOSCOPY  2014   gastritis   ESOPHAGOGASTRODUODENOSCOPY (EGD) WITH PROPOFOL N/A 04/16/2019   Procedure: ESOPHAGOGASTRODUODENOSCOPY (EGD) WITH PROPOFOL;  Surgeon: Pasty Spillers, MD;  Location: [facility] ENDOSCOPY;  Service: Endoscopy;  Laterality: N/A;   ESOPHAGOGASTRODUODENOSCOPY (EGD) WITH PROPOFOL N/A 10/03/2019   Procedure: ESOPHAGOGASTRODUODENOSCOPY (EGD) WITH PROPOFOL;  Surgeon: Pasty Spillers, MD;  Location: [facility] ENDOSCOPY;  Service: Endoscopy;  Laterality: N/A;   EUS N/A 09/10/2020   Procedure: LOWER ENDOSCOPIC ULTRASOUND (EUS);  Surgeon: Lemar Lofty., MD;  Location: [facility] ENDOSCOPY;  Service: Gastroenterology;  Laterality: N/A;   EUS N/A 11/11/2021   Procedure: LOWER ENDOSCOPIC ULTRASOUND (EUS);  Surgeon: Lemar Lofty., MD;  Location: [facility] ENDOSCOPY;  Service: Gastroenterology;  Laterality: N/A;   FLEXIBLE SIGMOIDOSCOPY N/A 08/27/2020   Procedure: FLEXIBLE SIGMOIDOSCOPY;  Surgeon: Pasty Spillers, MD;  Location: [facility] ENDOSCOPY;  Service: Endoscopy;  Laterality: N/A;   FLEXIBLE SIGMOIDOSCOPY N/A 09/10/2020   Procedure: FLEXIBLE SIGMOIDOSCOPY;  Surgeon: Meridee Score Netty Starring., MD;  Location: [facility]  ENDOSCOPY;  Service: Gastroenterology;  Laterality: N/A;   FLEXIBLE SIGMOIDOSCOPY N/A 11/11/2021   Procedure: FLEXIBLE SIGMOIDOSCOPY;  Surgeon: Meridee Score Netty Starring., MD;  Location: [facility] ENDOSCOPY;  Service: Gastroenterology;  Laterality: N/A;   FOREIGN BODY REMOVAL  09/10/2020   Procedure: FOREIGN BODY REMOVAL ;  Surgeon: Meridee Score Netty Starring., MD;  Location: [facility] ENDOSCOPY;  Service: Gastroenterology;;   HEMOSTASIS CLIP PLACEMENT  09/10/2020   Procedure: HEMOSTASIS CLIP PLACEMENT;  Surgeon: Lemar Lofty., MD;  Location: [facility] ENDOSCOPY;  Service: Gastroenterology;;   HYSTEROSCOPY WITH D & C N/A  03/25/2021   Procedure: DILATATION AND CURETTAGE Melton Krebs;  Surgeon: Vena Austria, MD;  Location: [facility] ORS;  Service: Gynecology;  Laterality: N/A;   IR RADIOLOGIST EVAL & MGMT  01/03/2023   POLYPECTOMY  08/28/2015   Procedure: POLYPECTOMY INTESTINAL;  Surgeon: Midge Minium, MD;  Location: [facility];  Service: Endoscopy;;  Rectal polyp   SUBMUCOSAL LIFTING INJECTION  09/10/2020   Procedure: SUBMUCOSAL LIFTING INJECTION;  Surgeon: Lemar Lofty., MD;  Location: [facility] ENDOSCOPY;  Service: Gastroenterology;;   TUBAL LIGATION Bilateral    UMBILICAL HERNIA REPAIR N/A 08/09/2016   Procedure: HERNIA REPAIR UMBILICAL ADULT;  Surgeon: Henrene Dodge, MD;  Location: [facility] ORS;  Service: General;  Laterality: N/A;   VENTRAL HERNIA REPAIR N/A 04/10/2015   Procedure: HERNIA REPAIR VENTRAL ADULT;  Surgeon: Nadeen Landau, MD;  Location: [facility] ORS;  Service: General;  Laterality: N/A;   Family History  Problem Relation Age of Onset   Cancer Mother 71       Pancreatic   Cancer Father        Throat dx 81s   Healthy Sister    Cancer Sister 65       unk metastatic   Kidney cancer Sister 37   HIV Brother    Breast cancer Maternal Grandmother 39   Breast cancer Cousin        d. 54s   Social History   Tobacco Use   Smoking status: Every Day    Current packs/day: 0.50    Average packs/day: 0.5 packs/day for 45.0 years (22.5 ttl pk-yrs)    Types: Cigarettes   Smokeless tobacco: Never   Tobacco comments:    Smokes 7 ciggs daily.  Vaping Use   Vaping status: Never Used  Substance Use Topics   Alcohol use: Yes    Alcohol/week: 2.0 standard drinks of alcohol    Types: 2 Cans of beer per week    Comment: weekends - beer    Drug use: No    Pertinent Clinical Results:  LABS:   Hospital Outpatient Visit on 01/11/2023  Component Date Value Ref Range Status   WBC 01/11/2023 7.5  4.0 - 10.5 K/uL Final   RBC 01/11/2023 4.93  3.87 - 5.11 MIL/uL Final   Hemoglobin 01/11/2023  13.3  12.0 - 15.0 g/dL Final   HCT 08/65/7846 42.8  36.0 - 46.0 % Final   MCV 01/11/2023 86.8  80.0 - 100.0 fL Final   MCH 01/11/2023 27.0  26.0 - 34.0 pg Final   MCHC 01/11/2023 31.1  30.0 - 36.0 g/dL Final   RDW 96/29/5284 15.0  11.5 - 15.5 % Final   Platelets 01/11/2023 247  150 - 400 K/uL Final   nRBC 01/11/2023 0.0  0.0 - 0.2 % Final   Performed at [facility], [address] [place], [place] [address]   Sodium 01/11/2023 141  135 - 145 mmol/L Final   Potassium 01/11/2023 3.5  3.5 -  5.1 mmol/L Final   Chloride 01/11/2023 106  98 - 111 mmol/L Final   CO2 01/11/2023 26  22 - 32 mmol/L Final   Glucose, Bld 01/11/2023 107 (H)  70 - 99 mg/dL Final   Glucose reference range applies only to samples taken after fasting for at least 8 hours.   BUN 01/11/2023 14  8 - 23 mg/dL Final   Creatinine, Ser 01/11/2023 0.76  0.44 - 1.00 mg/dL Final   Calcium 16/02/9603 8.8 (L)  8.9 - 10.3 mg/dL Final   GFR, Estimated 01/11/2023 >60  >60 mL/min Final   Comment: (NOTE) Calculated using the CKD-EPI Creatinine Equation (2021)    Anion gap 01/11/2023 9  5 - 15 Final   Performed at [facility], [address] [place], [place] [address]    ECG: Date: 01/11/2023 Time ECG obtained: 1032 AM Rate: 75 bpm Rhythm: normal sinus Axis (leads I and aVF): Normal Intervals: PR 164 ms. QRS 80 ms. QTc 455 ms. ST segment and T wave changes: Nonspecific T wave abnormalities in the inferolateral leads  Comparison: Similar to previous tracing obtained on 06/01/2021   IMAGING / PROCEDURES: CT ABDOMEN PELVIS W CONTRAST performed on 12/06/2022 Enhancing lesion of the lower pole of the left kidney measuring 2.4 cm increased in size when compared with the prior and, highly concerning for renal cell carcinoma. No evidence of metastatic disease in the abdomen or pelvis. Aortic atherosclerosis   CT CHEST LCS NODULE F/U LOW DOSE WO CONTRAST performed on 08/17/2022 Lung-RADS 2, benign appearance  or behavior. Continue annual screening with low-dose chest CT without contrast in 12 months. Aortic atherosclerosis Emphysema  MYOCARDIAL PERFUSION IMAGING STUDY (LEXISCAN) performed on 11/23/2020 LVEF 51% Normal myocardial thickening and wall motion No artifacts noted Left ventricular cavity size normal No evidence of stress-induced myocardial ischemia or arrhythmia   TRANSTHORACIC ECHOCARDIOGRAM performed on 10/29/2020 LVEF >55% Normal left ventricular systolic function with mild LVH Normal right ventricular systolic function Grade 1 diastolic dysfunction Trivial AR, TR, and PR Mild MR No valvular stenosis No evidence of pericardial effusion  Impression and Plan:  Donna Riley has been referred for pre-anesthesia review and clearance prior to her undergoing the planned anesthetic and procedural courses. Available labs, pertinent testing, and imaging results were personally reviewed by me in preparation for upcoming operative/procedural course. [facility] medical record has been updated following extensive record review and patient interview with PAT staff.   This patient has been appropriately cleared by cardiology with an overall ACCEPTABLE risk of experiencing significant perioperative cardiovascular complications. Based on clinical review performed today (01/11/23), barring any significant acute changes in the patient's overall condition, it is anticipated that she will be able to proceed with the planned surgical intervention. Any acute changes in clinical condition may necessitate her procedure being postponed and/or cancelled. Patient will meet with anesthesia team (MD and/or CRNA) on the day of her procedure for preoperative evaluation/assessment. Questions regarding anesthetic course will be fielded at that time.   Pre-surgical instructions were reviewed with the patient during her PAT appointment, and questions were fielded to satisfaction by PAT clinical staff. She has been  instructed on which medications that she will need to hold prior to surgery, as well as the ones that have been deemed safe/appropriate to take on the day of her procedure. As part of the general education provided by PAT, patient made aware both verbally and in writing, that she would need to abstain from the use of any illegal substances  during her perioperative course.  She was advised that failure to follow the provided instructions could necessitate case cancellation or result in serious perioperative complications up to and including death. Patient encouraged to contact PAT and/or her surgeon's office to discuss any questions or concerns that may arise prior to surgery; verbalized understanding.   Quentin Mulling, MSN, APRN, FNP-C, CEN [facility]  Peri-operative Services Nurse Practitioner Phone: [phone] Fax: [phone] 01/11/23 3:39 PM  NOTE: This note has been prepared using Dragon dictation software. Despite my best ability to proofread, there is always the potential that unintentional transcriptional errors may still occur from this process.

## 2023-01-11 ENCOUNTER — Encounter
Admission: RE | Admit: 2023-01-11 | Discharge: 2023-01-11 | Disposition: A | Discharge status: Home / Self Care | Payer: 59 | Source: Ambulatory Visit | Attending: Interventional Radiology | Admitting: Interventional Radiology

## 2023-01-11 DIAGNOSIS — N2889 Other specified disorders of kidney and ureter: Secondary | ICD-10-CM | POA: Insufficient documentation

## 2023-01-11 DIAGNOSIS — Z01818 Encounter for other preprocedural examination: Secondary | ICD-10-CM | POA: Diagnosis not present

## 2023-01-11 DIAGNOSIS — Z01812 Encounter for preprocedural laboratory examination: Secondary | ICD-10-CM

## 2023-01-11 DIAGNOSIS — Z01810 Encounter for preprocedural cardiovascular examination: Secondary | ICD-10-CM | POA: Diagnosis not present

## 2023-01-11 LAB — CBC
HCT: 42.8 % (ref 36.0–46.0)
Hemoglobin: 13.3 g/dL (ref 12.0–15.0)
MCH: 27 pg (ref 26.0–34.0)
MCHC: 31.1 g/dL (ref 30.0–36.0)
MCV: 86.8 fL (ref 80.0–100.0)
Platelets: 247 10*3/uL (ref 150–400)
RBC: 4.93 MIL/uL (ref 3.87–5.11)
RDW: 15 % (ref 11.5–15.5)
WBC: 7.5 10*3/uL (ref 4.0–10.5)
nRBC: 0 % (ref 0.0–0.2)

## 2023-01-11 LAB — BASIC METABOLIC PANEL
Anion gap: 9 (ref 5–15)
BUN: 14 mg/dL (ref 8–23)
CO2: 26 mmol/L (ref 22–32)
Calcium: 8.8 mg/dL — ABNORMAL LOW (ref 8.9–10.3)
Chloride: 106 mmol/L (ref 98–111)
Creatinine, Ser: 0.76 mg/dL (ref 0.44–1.00)
GFR, Estimated: >60 mL/min (ref >60)
Glucose, Bld: 107 mg/dL — ABNORMAL HIGH (ref 70–99)
Potassium: 3.5 mmol/L (ref 3.5–5.1)
Sodium: 141 mmol/L (ref 135–145)

## 2023-01-12 ENCOUNTER — Other Ambulatory Visit: Payer: Self-pay | Admitting: Radiology

## 2023-01-12 DIAGNOSIS — Z01812 Encounter for preprocedural laboratory examination: Secondary | ICD-10-CM

## 2023-01-12 NOTE — Progress Notes (Signed)
Spoke with patient 01/12/23 @ 1415. Reminded patient of need to arrive by 9 am, need to be NPO after midnight need to hold aspirin 81mg  and need for driver post-procedure. Patient verbalized understanding.

## 2023-01-12 NOTE — H&P (Signed)
Chief Complaint: Patient was seen in consultation today for left renal mass  Referring Physician(s): Pernell Dupre  Supervising Physician: Pernell Dupre  Patient Status: [facility] - Out-pt  History of Present Illness: Donna Riley is a 62 y.o. female with complex medical history including COPD, CAD, elevated LFTs, GERD, HTN, HLD, DM, left breast cancer, and neuroendocrine tumor.  As part of her regular surveilance imaging for her known history of cancer, she was found to have a new, enlarging left renal mass.  She was referred to Interventional Radiology by Dr. Lonna Cobb.  She met in consultation with Dr. Juliette Alcide 01/03/23 at time treatment with cryoablation was discussed.  She has elected to proceed with intervention.  She presents to [facility] IR today for procedure in her usual state of health.  She continues with light hematuria, but denies flank pain, abdominal pain.  She is aware of the goals of the procedure today and is agreeable to proceed.   Her sons are available today for transportation and post-procedure care at home.   Past Medical History:  Diagnosis Date   Abdominal bloating    Acute midline low back pain without sciatica 10/09/2018   Aortic atherosclerosis (HCC)    Arthritis    Breast cancer, left (HCC) 08/04/2014   a.) Bx (+) for invasive mammary carcinoma with DCIS; ER/PR (+), HER2/neu (-). (+) lymphovascular invasion with 1 of 2 lymph nodes being (+). Treated with lumpectomy + adjuvant chemoradiation.   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 neoplasm. b.) Bx 02/23/2021 --> no dysplasia or malignancy; benign.   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 IHC for cytokeratin AE1/AE3 and CD56 supports.   Osteoporosis of lumbar spine 03/12/2015   2021 - Prolia recommended by Oncology - to start once dental issues are resolved   Polyp of ascending colon    Polyp of descending colon    Polyp of sigmoid colon    Positive PPD, treated    PVD (peripheral vascular disease) with claudication (HCC)    Rectal polyp    T2DM (type 2 diabetes mellitus) (HCC) 06/2014    Past Surgical History:  Procedure Laterality Date   BIOPSY  11/11/2021   Procedure: BIOPSY;  Surgeon: Lemar Lofty., MD;  Location: [facility] ENDOSCOPY;  Service: Gastroenterology;;   BREAST BIOPSY Left 07/31/2014   IMC and  DCIS    BREAST BIOPSY Left 03/08/2016   INTRADUCTAL PAPILLOMA    BREAST LUMPECTOMY Left 07/2014   IDC, DCIS, clear margins, positive LN   BREAST LUMPECTOMY Left 04/05/2016   excision of intraductal papilloma, no malignancy or atypia   BREAST LUMPECTOMY WITH NEEDLE LOCALIZATION Left 04/05/2016   Procedure: BREAST LUMPECTOMY WITH NEEDLE LOCALIZATION;  Surgeon: Gladis Riffle, MD;  Location: [facility] ORS;  Service: General;  Laterality: Left;   BREAST LUMPECTOMY WITH NEEDLE LOCALIZATION AND AXILLARY SENTINEL LYMPH NODE BX Left 08/27/14   CHOLECYSTECTOMY  09/07/2013   COLONOSCOPY  WITH PROPOFOL N/A 08/28/2015   Procedure: COLONOSCOPY WITH PROPOFOL;  Surgeon: Midge Minium, MD;  Location: [facility];  Service: Endoscopy;  Laterality: N/A;  Diabetic oral   COLONOSCOPY WITH PROPOFOL N/A 08/14/2020   Procedure: COLONOSCOPY WITH PROPOFOL;  Surgeon: Pasty Spillers, MD;  Location: [facility] ENDOSCOPY;  Service: Endoscopy;  Laterality: N/A;   ENDOSCOPIC MUCOSAL RESECTION N/A 09/10/2020   Procedure: ENDOSCOPIC MUCOSAL RESECTION;  Surgeon: Meridee Score Netty Starring., MD;  Location: [facility] ENDOSCOPY;  Service: Gastroenterology;  Laterality: N/A;   ESOPHAGOGASTRODUODENOSCOPY  2014   gastritis   ESOPHAGOGASTRODUODENOSCOPY (EGD) WITH PROPOFOL N/A 04/16/2019   Procedure: ESOPHAGOGASTRODUODENOSCOPY (EGD) WITH PROPOFOL;  Surgeon: Pasty Spillers, MD;  Location: [facility] ENDOSCOPY;  Service: Endoscopy;  Laterality: N/A;   ESOPHAGOGASTRODUODENOSCOPY (EGD) WITH PROPOFOL N/A 10/03/2019   Procedure: ESOPHAGOGASTRODUODENOSCOPY (EGD) WITH PROPOFOL;  Surgeon: Pasty Spillers, MD;  Location: [facility] ENDOSCOPY;  Service: Endoscopy;  Laterality: N/A;   EUS N/A 09/10/2020   Procedure: LOWER ENDOSCOPIC ULTRASOUND (EUS);  Surgeon: Lemar Lofty., MD;  Location: [facility] ENDOSCOPY;  Service: Gastroenterology;  Laterality: N/A;   EUS N/A 11/11/2021   Procedure: LOWER ENDOSCOPIC ULTRASOUND (EUS);  Surgeon: Lemar Lofty., MD;  Location: [facility] ENDOSCOPY;  Service: Gastroenterology;  Laterality: N/A;   FLEXIBLE SIGMOIDOSCOPY N/A 08/27/2020   Procedure: FLEXIBLE SIGMOIDOSCOPY;  Surgeon: Pasty Spillers, MD;  Location: [facility] ENDOSCOPY;  Service: Endoscopy;  Laterality: N/A;   FLEXIBLE SIGMOIDOSCOPY N/A 09/10/2020   Procedure: FLEXIBLE SIGMOIDOSCOPY;  Surgeon: Meridee Score Netty Starring., MD;  Location: [facility] ENDOSCOPY;  Service: Gastroenterology;  Laterality: N/A;   FLEXIBLE SIGMOIDOSCOPY N/A 11/11/2021   Procedure: FLEXIBLE SIGMOIDOSCOPY;  Surgeon: Meridee Score Netty Starring., MD;  Location: [facility] ENDOSCOPY;   Service: Gastroenterology;  Laterality: N/A;   FOREIGN BODY REMOVAL  09/10/2020   Procedure: FOREIGN BODY REMOVAL ;  Surgeon: Meridee Score Netty Starring., MD;  Location: [facility] ENDOSCOPY;  Service: Gastroenterology;;   HEMOSTASIS CLIP PLACEMENT  09/10/2020   Procedure: HEMOSTASIS CLIP PLACEMENT;  Surgeon: Lemar Lofty., MD;  Location: [facility] ENDOSCOPY;  Service: Gastroenterology;;   HYSTEROSCOPY WITH D & C N/A 03/25/2021   Procedure: DILATATION AND CURETTAGE Melton Krebs;  Surgeon: Vena Austria, MD;  Location: [facility] ORS;  Service: Gynecology;  Laterality: N/A;   IR RADIOLOGIST EVAL & MGMT  01/03/2023   POLYPECTOMY  08/28/2015   Procedure: POLYPECTOMY INTESTINAL;  Surgeon: Midge Minium, MD;  Location: [facility];  Service: Endoscopy;;  Rectal polyp   SUBMUCOSAL LIFTING INJECTION  09/10/2020   Procedure: SUBMUCOSAL LIFTING INJECTION;  Surgeon: Lemar Lofty., MD;  Location: [facility] ENDOSCOPY;  Service: Gastroenterology;;   TUBAL LIGATION Bilateral    UMBILICAL HERNIA REPAIR N/A 08/09/2016   Procedure: HERNIA REPAIR UMBILICAL ADULT;  Surgeon: Henrene Dodge, MD;  Location: [facility] ORS;  Service: General;  Laterality: N/A;   VENTRAL HERNIA REPAIR N/A 04/10/2015   Procedure: HERNIA REPAIR VENTRAL ADULT;  Surgeon: Nadeen Landau, MD;  Location: [facility] ORS;  Service: General;  Laterality: N/A;    Allergies: Patient has no known allergies.  Medications: Prior to Admission medications   Medication Sig Start Date End Date Taking? Authorizing Provider  acetaminophen (TYLENOL) 500 MG tablet Take 1,000 mg by mouth every 6 (six) hours as needed for moderate pain.    [provider]  albuterol (VENTOLIN HFA) 108 (90 Base) MCG/ACT inhaler INHALE 2 PUFFS INTO THE LUNGS EVERY 6 HOURS AS NEEDED FOR WHEEZING OR SHORTNESS OF BREATH 04/08/22   Reubin Milan, MD  amLODipine (NORVASC) 10 MG tablet TAKE 1 TABLET(10 MG) BY MOUTH DAILY 10/17/22   Reubin Milan, MD  aspirin EC 81 MG tablet Take  81 mg by mouth daily.    [provider]  atorvastatin (LIPITOR) 10 MG tablet Take 1 tablet (10 mg total) by mouth at bedtime. 04/19/22   Reubin Milan, MD  Blood Glucose Calibration (TRUE METRIX LEVEL 1) Low SOLN 1 each by In Vitro route daily as needed. 11/22/17   Reubin Milan, MD  Blood Glucose Monitoring Suppl (TRUE METRIX METER) DEVI 1 each by Does not apply route daily. 12/15/17   Reubin Milan, MD  glucose blood test strip Test BID 11/04/21   Reubin Milan, MD  irbesartan (AVAPRO) 300 MG tablet TAKE 1 TABLET(300 MG) BY MOUTH DAILY 10/17/22   Reubin Milan, MD  metFORMIN (GLUCOPHAGE-XR) 500 MG 24 hr tablet TAKE 1 TABLET BY MOUTH EVERY MORNING WITH BREAKFAST 10/17/22   Reubin Milan, MD  methocarbamol (ROBAXIN) 500 MG tablet Take 500 mg by mouth at bedtime. 11/12/21   Darr, Gerilyn Pilgrim, PA-C  Multiple Vitamins-Minerals (MULTIVITAMIN WITH MINERALS) tablet Take 1 tablet by mouth daily.    [provider]  phenazopyridine (PYRIDIUM) 95 MG tablet Take 1 tablet (95 mg total) by mouth 3 (three) times daily as needed for pain. 07/18/22   Cuthriell, Delorise Royals, PA-C  triamterene-hydrochlorothiazide (MAXZIDE-25) 37.5-25 MG tablet TAKE 1 TABLET BY MOUTH DAILY 10/17/22   Reubin Milan, MD     Family History  Problem Relation Age of Onset   Cancer Mother 45       Pancreatic   Cancer Father        Throat dx 74s   Healthy Sister    Cancer Sister 75       unk metastatic   Kidney cancer Sister 3   HIV Brother    Breast cancer Maternal Grandmother 14   Breast cancer Cousin        d. 28s    Social History   Socioeconomic History   Marital status: Widowed    Spouse name: Not on file   Number of children: 4   Years of education: Not on file   Highest education level: 10th grade  Occupational History   Occupation: Disabled  Tobacco Use   Smoking status: Every Day    Current packs/day: 0.50    Average packs/day: 0.5 packs/day for 45.0 years (22.5 ttl pk-yrs)     Types: Cigarettes   Smokeless tobacco: Never   Tobacco comments:    Smokes 7 ciggs daily.  Vaping Use   Vaping status: Never Used  Substance and Sexual Activity   Alcohol use: Yes    Alcohol/week: 2.0 standard drinks of alcohol    Types: 2 Cans of beer per week    Comment: weekends - beer    Drug use: No   Sexual activity: Not Currently  Other Topics Concern   Not on file  Social History Narrative   Pt lives alone, son lives 2 doors down.   Social Determinants of Health   Financial Resource Strain: Low Risk  (11/10/2022)   Overall Financial Resource Strain (CARDIA)    Difficulty of Paying Living Expenses: Not hard at all  Food Insecurity: No Food Insecurity (11/10/2022)   Hunger Vital Sign    Worried About Running Out of Food in the Last Year: Never true    Ran Out of Food in the Last Year: Never true  Transportation Needs: No Transportation Needs (11/10/2022)   PRAPARE - Administrator, Civil Service (Medical): No    Lack of Transportation (Non-Medical): No  Physical Activity: Insufficiently Active (11/10/2022)   Exercise Vital Sign    Days of Exercise per Week: 3 days    Minutes of Exercise per Session: 30 min  Stress: No Stress Concern Present (11/10/2022)   [employer] of Occupational Health - Occupational Stress Questionnaire    Feeling of Stress : Not at all  Social Connections: Moderately Isolated (11/10/2022)   Social Connection and Isolation Panel [NHANES]    Frequency of Communication with Friends and Family: More than three times a week    Frequency of Social Gatherings with Friends and Family: More than three times a week    Attends Religious Services: 1 to 4 times per year    Active Member of [employer] or Organizations: No    Attends Banker Meetings: Never    Marital Status: Widowed     Review of Systems: A 12 point ROS discussed and pertinent positives are indicated in the HPI above.  All other systems are negative.  Review of  Systems  Constitutional:  Negative for fatigue and fever.  Respiratory:  Negative for cough and shortness of breath.   Cardiovascular:  Negative for chest pain.  Gastrointestinal:  Negative for abdominal pain, nausea and vomiting.  Genitourinary:  Negative for dysuria, hematuria and urgency.  Musculoskeletal:  Negative for back pain.  Psychiatric/Behavioral:  Negative for behavioral problems and confusion.     Vital Signs: BP (!) 153/73   Pulse 74   Temp 98.1 F (36.7 C) (Oral)   Resp 16   Ht 5\' 8"  (1.727 m)   Wt 202 lb (91.6 kg)   SpO2 98%   BMI 30.71 kg/m   Physical Exam Vitals and nursing note reviewed.  Constitutional:      General: She is not in acute distress.    Appearance: Normal appearance. She is not ill-appearing.  HENT:     Mouth/Throat:     Mouth: Mucous membranes are moist.     Pharynx: Oropharynx is clear.  Cardiovascular:     Rate and Rhythm: Normal rate and regular rhythm.  Pulmonary:     Effort: Pulmonary effort is normal.     Breath sounds: Normal breath sounds.  Abdominal:     General: Abdomen is flat. There is no distension.     Palpations: Abdomen is soft.  Skin:    General: Skin is warm and dry.  Neurological:     General: No focal deficit present.     Mental Status: She is alert and oriented to person, place, and time. Mental status is at baseline.  Psychiatric:        Mood and Affect: Mood normal.        Behavior: Behavior normal.        Thought Content: Thought content normal.        Judgment: Judgment  normal.      MD Evaluation Airway: WNL Heart: WNL Abdomen: WNL Chest/ Lungs: WNL ASA  Classification: 3 Mallampati/Airway Score: Two   Imaging: IR Radiologist Eval & Mgmt  Result Date: 01/03/2023 EXAM: NEW PATIENT OFFICE VISIT CHIEF COMPLAINT: Refer to EMR HISTORY OF PRESENT ILLNESS: The patient reports undergoing routine CT surveillance for her cancer history on December 06, 2022. The scan demonstrated a new/enlarging renal mass in  the left renal lower pole measuring up to 2.4 cm, previously 1.7 cm on previous imaging in February 2024 when it was less conspicuous. The patient denies any symptoms such as flank pain or gross hematuria. The patient was evaluated by the referring urologist Dr. Lonna Cobb in his clinic yesterday, and discussed treatment options including surgical resection and percutaneous ablation. She is interested in percutaneous ablation. REVIEW OF SYSTEMS: Refer to EMR PHYSICAL EXAMINATION: Refer to EMR ASSESSMENT AND PLAN: Refer to EMR Electronically Signed   By: Olive Bass M.D.   On: 01/03/2023 12:16    Labs:  CBC: Recent Labs    06/29/22 0844 07/18/22 1647 09/13/22 1014 01/11/23 1024  WBC 7.8 8.0 9.4 7.5  HGB 14.1 14.1 14.3 13.3  HCT 44.0 44.6 44.1 42.8  PLT 241 212 258 247    COAGS: Recent Labs    01/13/23 0909  INR 1.0    BMP: Recent Labs    06/29/22 0844 07/18/22 1647 09/13/22 1014 12/06/22 1550 01/11/23 1024  NA 140 134* 135  --  141  K 3.7 3.5 3.8  --  3.5  CL 105 102 103  --  106  CO2 24 22 23   --  26  GLUCOSE 133* 116* 117*  --  107*  BUN 9 13 20   --  14  CALCIUM 8.9 8.7* 9.3  --  8.8*  CREATININE 0.66 1.06* 0.86 0.80 0.76  GFRNONAA >60 59* >60  --  >60    LIVER FUNCTION TESTS: Recent Labs    03/07/22 0901 06/29/22 0844 07/18/22 1647 09/13/22 1014  BILITOT 0.4 0.3 0.4 0.6  AST 24 26 69* 26  ALT 21 28 68* 24  ALKPHOS 95 84 111 93  PROT 7.4 7.5 7.6 7.9  ALBUMIN 4.1 4.0 4.1 4.2    TUMOR MARKERS: No results for input(s): "AFPTM", "CEA", "CA199", "CHROMGRNA" in the last 8760 hours.  Assessment and Plan: Patient with past medical history of breast cancer and neuroendocrine tumor presents with complaint of left renal mass.  She was referred to Interventional Radiology by Dr. Lonna Cobb and met with Dr. Juliette Alcide in consultation 01/03/23.  After discussion she elects to proceed with left renal mass cryoablation. Patient presents today in their usual state of health.   She has been NPO and is not currently on blood thinners.   She is aware of potential admission overnight for observation if needed, however our mutual goal is for discharge home today from PACU if stable and doing well.   Risks and benefits of image guided renal cryoablation was discussed with the patient including, but not limited to, failure to treat entire lesion, bleeding, infection, damage to adjacent structures, hematuria, urine leak, decrease in renal function or post procedural neuropathy.  All of the patient's questions were answered and the patient is agreeable to proceed. Consent signed and in chart.   Thank you for this interesting consult.  I greatly enjoyed meeting Donna Riley and look forward to participating in their care.  A copy of this report was sent to the requesting provider  on this date.  Electronically Signed: Hoyt Koch, PA 01/13/2023, 9:49 AM   I spent a total of    25 Minutes in face to face in clinical consultation, greater than 50% of which was counseling/coordinating care for left renal mass.

## 2023-01-13 ENCOUNTER — Encounter: Payer: Self-pay | Admitting: Urgent Care

## 2023-01-13 ENCOUNTER — Ambulatory Visit
Admission: RE | Admit: 2023-01-13 | Discharge: 2023-01-13 | Disposition: A | Discharge status: Home / Self Care | Payer: 59 | Source: Ambulatory Visit | Attending: Interventional Radiology | Admitting: Interventional Radiology

## 2023-01-13 ENCOUNTER — Other Ambulatory Visit: Payer: Self-pay

## 2023-01-13 DIAGNOSIS — I2510 Atherosclerotic heart disease of native coronary artery without angina pectoris: Secondary | ICD-10-CM | POA: Diagnosis not present

## 2023-01-13 DIAGNOSIS — I10 Essential (primary) hypertension: Secondary | ICD-10-CM | POA: Diagnosis not present

## 2023-01-13 DIAGNOSIS — Z01812 Encounter for preprocedural laboratory examination: Secondary | ICD-10-CM | POA: Diagnosis not present

## 2023-01-13 DIAGNOSIS — I739 Peripheral vascular disease, unspecified: Secondary | ICD-10-CM | POA: Diagnosis not present

## 2023-01-13 DIAGNOSIS — N2889 Other specified disorders of kidney and ureter: Secondary | ICD-10-CM | POA: Diagnosis not present

## 2023-01-13 DIAGNOSIS — E119 Type 2 diabetes mellitus without complications: Secondary | ICD-10-CM | POA: Diagnosis not present

## 2023-01-13 HISTORY — DX: Atherosclerosis of aorta: I70.0

## 2023-01-13 HISTORY — DX: Peripheral vascular disease, unspecified: I73.9

## 2023-01-13 LAB — GLUCOSE, CAPILLARY: Glucose-Capillary: 116 mg/dL — ABNORMAL HIGH (ref 70–99)

## 2023-01-13 LAB — PROTIME-INR
INR: 1 (ref 0.8–1.2)
Prothrombin Time: 13.4 seconds (ref 11.4–15.2)

## 2023-01-13 MED ORDER — ROCURONIUM BROMIDE 100 MG/10ML IV SOLN
INTRAVENOUS | Status: DC | PRN
  Administered 2023-01-13: 10 mg via INTRAVENOUS
  Administered 2023-01-13: 50 mg via INTRAVENOUS
  Administered 2023-01-13 (×3): 10 mg via INTRAVENOUS
  Administered 2023-01-13: 20 mg via INTRAVENOUS

## 2023-01-13 MED ORDER — SUGAMMADEX SODIUM 200 MG/2ML IV SOLN
INTRAVENOUS | Status: DC | PRN
  Administered 2023-01-13: 200 mg via INTRAVENOUS

## 2023-01-13 MED ORDER — SODIUM CHLORIDE 0.9 % IV SOLN: INTRAVENOUS | Status: DC

## 2023-01-13 MED ORDER — ORAL CARE MOUTH RINSE: 15.0000 mL | Freq: Once | OROMUCOSAL | Status: DC

## 2023-01-13 MED ORDER — PROPOFOL 10 MG/ML IV BOLUS
INTRAVENOUS | Status: DC | PRN
Start: 2023-01-13 — End: 2023-01-13
  Administered 2023-01-13: 130 mg via INTRAVENOUS

## 2023-01-13 MED ORDER — MIDAZOLAM HCL 2 MG/2ML IJ SOLN
INTRAMUSCULAR | Status: DC | PRN
  Administered 2023-01-13: 2 mg via INTRAVENOUS

## 2023-01-13 MED ORDER — ONDANSETRON HCL 4 MG/2ML IJ SOLN: 4.0000 mg | Freq: Once | INTRAMUSCULAR | Status: DC | PRN

## 2023-01-13 MED ORDER — LIDOCAINE HCL 1 % IJ SOLN
10.0000 mL | Freq: Once | INTRAMUSCULAR | Status: AC
  Administered 2023-01-13: 10 mL
  Filled 2023-01-13: qty 10

## 2023-01-13 MED ORDER — ACETAMINOPHEN 10 MG/ML IV SOLN: 1000.0000 mg | Freq: Once | INTRAVENOUS | Status: DC | PRN

## 2023-01-13 MED ORDER — FENTANYL CITRATE PF 50 MCG/ML IJ SOSY
25.0000 ug | PREFILLED_SYRINGE | INTRAMUSCULAR | Status: DC | PRN
  Administered 2023-01-13: 25 ug via INTRAVENOUS
  Administered 2023-01-13: 50 ug via INTRAVENOUS
  Administered 2023-01-13: 25 ug via INTRAVENOUS

## 2023-01-13 MED ORDER — LIDOCAINE HCL (CARDIAC) PF 100 MG/5ML IV SOSY
PREFILLED_SYRINGE | INTRAVENOUS | Status: DC | PRN
  Administered 2023-01-13: 100 mg via INTRAVENOUS

## 2023-01-13 MED ORDER — FENTANYL CITRATE (PF) 100 MCG/2ML IJ SOLN
INTRAMUSCULAR | Status: AC
  Filled 2023-01-13: qty 2

## 2023-01-13 MED ORDER — MIDAZOLAM HCL 2 MG/2ML IJ SOLN
INTRAMUSCULAR | Status: AC
  Filled 2023-01-13: qty 2

## 2023-01-13 MED ORDER — FENTANYL CITRATE (PF) 100 MCG/2ML IJ SOLN
INTRAMUSCULAR | Status: DC | PRN
  Administered 2023-01-13: 25 ug via INTRAVENOUS
  Administered 2023-01-13: 50 ug via INTRAVENOUS
  Administered 2023-01-13: 25 ug via INTRAVENOUS

## 2023-01-13 MED ORDER — ROCURONIUM BROMIDE 10 MG/ML (PF) SYRINGE
PREFILLED_SYRINGE | INTRAVENOUS | Status: AC
  Filled 2023-01-13: qty 10

## 2023-01-13 MED ORDER — LACTATED RINGERS IV SOLN: INTRAVENOUS | Status: DC

## 2023-01-13 MED ORDER — KETOROLAC TROMETHAMINE 30 MG/ML IJ SOLN
INTRAMUSCULAR | Status: AC
  Filled 2023-01-13: qty 1

## 2023-01-13 MED ORDER — PROPOFOL 10 MG/ML IV BOLUS
INTRAVENOUS | Status: AC
  Filled 2023-01-13: qty 20

## 2023-01-13 MED ORDER — PHENYLEPHRINE 80 MCG/ML (10ML) SYRINGE FOR IV PUSH (FOR BLOOD PRESSURE SUPPORT)
PREFILLED_SYRINGE | INTRAVENOUS | Status: AC
  Filled 2023-01-13: qty 20

## 2023-01-13 MED ORDER — DEXAMETHASONE SODIUM PHOSPHATE 10 MG/ML IJ SOLN
INTRAMUSCULAR | Status: DC | PRN
  Administered 2023-01-13: 5 mg via INTRAVENOUS

## 2023-01-13 MED ORDER — CHLORHEXIDINE GLUCONATE 0.12 % MT SOLN
15.0000 mL | Freq: Once | OROMUCOSAL | Status: DC
  Filled 2023-01-13: qty 15

## 2023-01-13 MED ORDER — OXYCODONE HCL 5 MG PO TABS
ORAL_TABLET | ORAL | Status: AC
  Filled 2023-01-13: qty 1

## 2023-01-13 MED ORDER — IOHEXOL 300 MG/ML  SOLN
100.0000 mL | Freq: Once | INTRAMUSCULAR | Status: AC | PRN
  Administered 2023-01-13: 100 mL via INTRAVENOUS

## 2023-01-13 MED ORDER — OXYCODONE HCL 5 MG PO TABS
5.0000 mg | ORAL_TABLET | Freq: Once | ORAL | Status: AC | PRN
  Administered 2023-01-13: 5 mg via ORAL

## 2023-01-13 MED ORDER — PHENYLEPHRINE 80 MCG/ML (10ML) SYRINGE FOR IV PUSH (FOR BLOOD PRESSURE SUPPORT)
PREFILLED_SYRINGE | INTRAVENOUS | Status: DC | PRN
  Administered 2023-01-13 (×5): 160 ug via INTRAVENOUS

## 2023-01-13 MED ORDER — KETOROLAC TROMETHAMINE 30 MG/ML IJ SOLN
INTRAMUSCULAR | Status: DC | PRN
  Administered 2023-01-13: 30 mg via INTRAVENOUS

## 2023-01-13 MED ORDER — ONDANSETRON HCL 4 MG/2ML IJ SOLN
4.0000 mg | Freq: Four times a day (QID) | INTRAMUSCULAR | Status: DC | PRN
  Filled 2023-01-13: qty 2

## 2023-01-13 MED ORDER — ONDANSETRON HCL 4 MG/2ML IJ SOLN
INTRAMUSCULAR | Status: DC | PRN
Start: 2023-01-13 — End: 2023-01-13
  Administered 2023-01-13: 4 mg via INTRAVENOUS

## 2023-01-13 MED ORDER — DEXAMETHASONE SODIUM PHOSPHATE 10 MG/ML IJ SOLN
INTRAMUSCULAR | Status: AC
  Filled 2023-01-13: qty 1

## 2023-01-13 MED ORDER — ONDANSETRON HCL 4 MG/2ML IJ SOLN
INTRAMUSCULAR | Status: AC
  Filled 2023-01-13: qty 2

## 2023-01-13 MED ORDER — OXYCODONE HCL 5 MG/5ML PO SOLN: 5.0000 mg | Freq: Once | ORAL | Status: AC | PRN

## 2023-01-13 NOTE — Anesthesia Procedure Notes (Signed)
Procedure Name: Intubation Date/Time: 01/13/2023 10:05 AM  Performed by: Omer Jack, CRNAPre-anesthesia Checklist: Patient identified, Patient being monitored, Timeout performed, Emergency Drugs available and Suction available Patient Re-evaluated:Patient Re-evaluated prior to induction Oxygen Delivery Method: Circle system utilized Preoxygenation: Pre-oxygenation with 100% oxygen Induction Type: IV induction Ventilation: Mask ventilation without difficulty Laryngoscope Size: 3 and McGraph Grade View: Grade I Tube type: Oral Tube size: 7.0 mm Number of attempts: 1 Airway Equipment and Method: Stylet Placement Confirmation: ETT inserted through vocal cords under direct vision, positive ETCO2 and breath sounds checked- equal and bilateral Secured at: 21 cm Tube secured with: Tape Dental Injury: Teeth and Oropharynx as per pre-operative assessment

## 2023-01-13 NOTE — Anesthesia Preprocedure Evaluation (Addendum)
Anesthesia Evaluation  Patient identified by MRN, date of birth, ID band Patient awake    Reviewed: Allergy & Precautions, NPO status , Patient's Chart, lab work & pertinent test results  History of Anesthesia Complications Negative for: history of anesthetic complications  Airway Mallampati: III   Neck ROM: Full    Dental  (+) Upper Dentures, Missing   Pulmonary COPD, Current Smoker (6-7 cigarettes per day) and Patient abstained from smoking.   Pulmonary exam normal breath sounds clear to auscultation       Cardiovascular hypertension, + CAD and + Peripheral Vascular Disease  Normal cardiovascular exam Rhythm:Regular Rate:Normal  ECG 01/10/23:  Normal sinus rhythm Nonspecific T wave abnormality  Myocardial perfusion 11/23/20:  Normal Lexiscan infusion EKG  Normal myocardial perfusion without evidence of myocardial ischemia   Echo 10/29/20:  NORMAL LEFT VENTRICULAR SYSTOLIC FUNCTION  NORMAL RIGHT VENTRICULAR SYSTOLIC FUNCTION  MILD VALVULAR REGURGITATION NO VALVULAR STENOSIS  MILD LVH      Neuro/Psych negative neurological ROS     GI/Hepatic ,GERD  ,,  Endo/Other  diabetes, Type 2  Obesity   Renal/GU      Musculoskeletal  (+) Arthritis ,  Gout    Abdominal   Peds  Hematology Breast CA   Anesthesia Other Findings Reviewed and agree with Edd Fabian pre-anesthesia clinical review note.    Cardiology note 12/16/22:  62 y.o. female with  Encounter Diagnoses  Name Primary?  Claudication (CMS-HCC) Yes  Coronary artery disease involving native coronary artery of native heart with other form of angina pectoris (CMS-HCC)  Atherosclerotic peripheral vascular disease with intermittent claudication (CMS-HCC)  Benign essential HTN  Hyperlipidemia, mixed   Plan   Claudication: ABIs completed after last appointment WNL. Patient reports claudication symptoms have improved.  -Continue to monitor for changes in  symptoms.  Shortness of breath: Rare episodes, overall not significantly bothersome.  -Continue to monitor for worsening episodes of SOB.   Coronary artery disease: Coronary atherosclerosis noted on CT Chest 2021. Patient denies any anginal symptoms. Most recent ischemic workup 2022 without evidence of ischemia.  -Continue aspirin, statin. -Continue to monitor for new or worsening anginal symptoms.   Peripheral vascular disease: abdominal atherosclerosis noted on CT 2018, carotid US 2022 with mild stenosis bilaterally -Continue aspirin, statin.  -Continue to monitor for claudication, dizziness, headaches, visual changes.   Hypertension: BP controlled in clinic today at 134/78. Patient is not monitoring this at home.  -Continue irbesartan 300 mg daily, triamterene-HCTZ 37.5-25 mg daily.  -Recommended daily BP monitoring, patient to contact our office if BP running consistently >140/90.   Hyperlipidemia: Most recent lipid panel with total cholesterol 148, LDL 59, triglycerides 293 from 06/2022.  -Continue atorvastatin 20 mg daily.   Return in about 9 months (around 09/16/2023).    Reproductive/Obstetrics                             Anesthesia Physical Anesthesia Plan  ASA: 3  Anesthesia Plan: General   Post-op Pain Management:    Induction: Intravenous  PONV Risk Score and Plan: 2 and Ondansetron, Dexamethasone and Treatment may vary due to age or medical condition  Airway Management Planned: Oral ETT  Additional Equipment:   Intra-op Plan:   Post-operative Plan: Extubation in OR  Informed Consent: I have reviewed the patients History and Physical, chart, labs and discussed the procedure including the risks, benefits and alternatives for the proposed anesthesia with the patient or authorized representative who  has indicated his/her understanding and acceptance.     Dental advisory given  Plan Discussed with: CRNA  Anesthesia Plan Comments:  (Patient consented for risks of anesthesia including but not limited to:  - adverse reactions to medications - damage to eyes, teeth, lips or other oral mucosa - nerve damage due to positioning  - sore throat or hoarseness - damage to heart, brain, nerves, lungs, other parts of body or loss of life  Informed patient about role of CRNA in peri- and intra-operative care.  Patient voiced understanding.)        Anesthesia Quick Evaluation

## 2023-01-13 NOTE — Procedures (Signed)
Interventional Radiology Procedure Note  Date of Procedure: 01/13/2023  Procedure: CT guided renal cryoablation   Findings:  1. CT guided renal cryoablation of left renal lower pole tumor with x2 IceForce probes, additional hydro dissection performed to isolate the proximal left ureter   Complications: No immediate complications noted.   Estimated Blood Loss: minimal  Follow-up and Recommendations: 1. Bedrest 6 hours  2. Foley catheter to be removed at 3 hours bedrest if urine remains clear  3. Discharge later today if pain well controlled    Olive Bass, MD  Vascular & Interventional Radiology  01/13/2023 1:24 PM

## 2023-01-13 NOTE — Progress Notes (Signed)
Foley catheter Dc'd intact now: urine clear with slight pink tinge. Pt. States this UOP is her baseline. MD made aware now. MD in at bedside, to speak with pt. Regarding procedure/results. Pt. Verbalizes understanding of conversation.

## 2023-01-13 NOTE — Discharge Instructions (Signed)
Pt. May take Tylenol 500 mg p.o. (2 tabs) for 24 hrs. If needed. Then Tylenol 325 mg p.o. (1-2 tabs) as needed for discomfort per MD

## 2023-01-13 NOTE — Progress Notes (Signed)
Interventional Radiology Brief Note:  [name] is assessed at bedside alongside Dr. Juliette Alcide after left renal mass cryoablation this AM.  She is resting comfortably in bed.  Her foley was recently removed.  Her urine remains slightly pink without significant increase in hematuria from her baseline.  She denies pain or discomfort. She remains on bedrest until 6pm this evening.  RN aware that if patient able to continue eating and drinking without severe nausea, void spontaneously, and ambulate without dizziness or lightheadedness she may discharge home to the care of her sons this evening.   Dicussed follow-up plans including follow-up visit with imaging in 3-6 months.  Patient aware schedulers will call to arrange.  Loyce Dys, MS RD PA-C

## 2023-01-13 NOTE — Transfer of Care (Signed)
Immediate Anesthesia Transfer of Care Note  Patient: Donna Riley  Procedure(s) Performed: CT RENAL TUMOR ABLATION UNILATERAL  Patient Location: PACU  Anesthesia Type:General  Level of Consciousness: drowsy and patient cooperative  Airway & Oxygen Therapy: Patient Spontanous Breathing and Patient connected to face mask oxygen  Post-op Assessment: Report given to RN and Post -op Vital signs reviewed and stable  Post vital signs: Reviewed and stable  Last Vitals:  Vitals Value Taken Time  BP 140/70 01/13/23 1337  Temp    Pulse 74 01/13/23 1343  Resp 16 01/13/23 1339  SpO2 98 % 01/13/23 1343  Vitals shown include unfiled device data.  Last Pain:  Vitals:   01/13/23 0926  TempSrc: Oral  PainSc: 0-No pain         Complications: No notable events documented.

## 2023-01-13 NOTE — Progress Notes (Signed)
Pt. Voided approx. 200 ml clear dark amber Urine now. Pt. Denies c/o flank pain, SOB, dizziness, HA, N/V.

## 2023-01-16 LAB — GLUCOSE, CAPILLARY: Glucose-Capillary: 188 mg/dL — ABNORMAL HIGH (ref 70–99)

## 2023-01-23 NOTE — Anesthesia Postprocedure Evaluation (Signed)
Anesthesia Post Note  Patient: Donna Riley  Procedure(s) Performed: CT RENAL TUMOR ABLATION UNILATERAL  Patient location during evaluation: PACU Anesthesia Type: General Level of consciousness: awake and alert, oriented and patient cooperative Pain management: pain level controlled Vital Signs Assessment: post-procedure vital signs reviewed and stable Respiratory status: spontaneous breathing, nonlabored ventilation and respiratory function stable Cardiovascular status: blood pressure returned to baseline and stable Postop Assessment: adequate PO intake Anesthetic complications: no   No notable events documented.   Last Vitals:  Vitals:   01/13/23 1700 01/13/23 1743  BP: (!) 149/76 (!) 156/73  Pulse: 72 76  Resp: 18   Temp:    SpO2: (!) 89% 91%    Last Pain:  Vitals:   01/13/23 1743  TempSrc:   PainSc: 0-No pain                 Reed Breech

## 2023-01-24 ENCOUNTER — Other Ambulatory Visit: Payer: Self-pay | Admitting: Internal Medicine

## 2023-01-24 ENCOUNTER — Other Ambulatory Visit: Payer: Self-pay | Admitting: Interventional Radiology

## 2023-01-24 DIAGNOSIS — N2889 Other specified disorders of kidney and ureter: Secondary | ICD-10-CM

## 2023-01-24 DIAGNOSIS — E118 Type 2 diabetes mellitus with unspecified complications: Secondary | ICD-10-CM

## 2023-01-24 DIAGNOSIS — I10 Essential (primary) hypertension: Secondary | ICD-10-CM

## 2023-01-25 NOTE — Telephone Encounter (Signed)
Both of these are duplicates.   Both transmitted and received by pharmacy.

## 2023-01-25 NOTE — Telephone Encounter (Signed)
Rx not transmitted on 01/24/2023.    Requested Prescriptions  Pending Prescriptions Disp Refills   glucose blood (ACCU-CHEK GUIDE) test strip [Pharmacy Med Name: ACCU-CHEK GUIDE TEST STRIPS 100S] 100 strip 12    Sig: TEST TWICE DAILY     Endocrinology: Diabetes - Testing Supplies Passed - 01/24/2023 10:23 AM      Passed - Valid encounter within last 12 months    Recent Outpatient Visits           1 month ago Type II diabetes mellitus with complication [facility])   [place] [facility] at [facility], Nyoka Cowden, MD   6 months ago Essential hypertension   [place] [facility] at [facility], Nyoka Cowden, MD   6 months ago Pelvic pain   [place] [facility] at [facility], Nyoka Cowden, MD   10 months ago Type II diabetes mellitus with complication [facility])   [place] [facility] at [facility], Nyoka Cowden, MD   1 year ago Encounter for Medicare annual wellness exam   [facility] at [facility], Nyoka Cowden, MD       Future Appointments             Tomorrow McGowan, Elana Alm [facility] [place]   In 2 months Reubin Milan, MD [facility] Primary Care & Sports Medicine at [facility], [facility]             triamterene-hydrochlorothiazide (MAXZIDE-25) 37.5-25 MG tablet [Pharmacy Med Name: TRIAMTERENE 37.5MG / HCTZ 25MG  TABS] 90 tablet 1    Sig: TAKE 1 TABLET BY MOUTH DAILY     Cardiovascular: Diuretic Combos Failed - 01/24/2023 10:23 AM      Failed - Last BP in normal range    BP Readings from Last 1 Encounters:  01/13/23 (!) 156/73         Passed - K in normal range and within 180 days    Potassium  Date Value Ref Range Status  01/11/2023 3.5 3.5 - 5.1 mmol/L Final  09/09/2014 4.3 mmol/L Final    Comment:    3.5-5.1 NOTE: New Reference Range  08/05/14          Passed - Na in normal  range and within 180 days    Sodium  Date Value Ref Range Status  01/11/2023 141 135 - 145 mmol/L Final  05/26/2017 145 (H) 134 - 144 mmol/L Final  09/09/2014 137 mmol/L Final    Comment:    135-145 NOTE: New Reference Range  08/05/14          Passed - Cr in normal range and within 180 days    Creatinine  Date Value Ref Range Status  09/13/2022 0.86 0.44 - 1.00 mg/dL Final  16/02/9603 5.40 mg/dL Final    Comment:    9.81-1.91 NOTE: New Reference Range  08/05/14    Creatinine, Ser  Date Value Ref Range Status  01/11/2023 0.76 0.44 - 1.00 mg/dL Final         Passed - Valid encounter within last 6 months    Recent Outpatient Visits           1 month ago Type II diabetes mellitus with complication [facility])   [place] [facility] at [facility], Nyoka Cowden, MD   6 months ago Essential hypertension     Primary Care & Sports Medicine at [facility], Nyoka Cowden, MD   6 months ago Pelvic pain   [place] [facility] at [facility], Nyoka Cowden, MD   10 months ago Type II diabetes mellitus with complication [facility])   [place] [facility] at [facility], Nyoka Cowden, MD   1 year ago Encounter for Medicare annual wellness exam   [facility] Primary [facility] at [facility], Nyoka Cowden, MD       Future Appointments             Tomorrow McGowan, Elana Alm [facility] [place]   In 2 months Judithann Graves, Nyoka Cowden, MD [facility] Primary Care & Sports Medicine at [facility], [facility]

## 2023-01-25 NOTE — Progress Notes (Signed)
01/26/2023 2:38 PM   Barbaraann Barthel Jan 03, 1961 [id number]  Referring provider: Reubin Milan, MD [address] [place],  [place] [address]  Urological history: 1. Left renal mass -contrast CT (11/2022) - Enhancing lesion of the lower pole of the left kidney measuring 2.4 cm increased in size when compared with the prior and, highly concerning for renal cell carcinoma -cryoablation of the left renal mass (01/13/2023)   Chief Complaint  Patient presents with   Abdominal Pain   HPI: Donna Riley is a 62 y.o. female who presents today for pain.   Previous records reviewed.   She has been having right lower quadrant pain that radiates into her right lower back for the last 4 months.  She has had pelvic ultrasounds which did not demonstrate an etiology for her right lower quadrant pain.  CT scans have not noted any etiology for her right lower quadrant pain.  Sitting down and laying recumbent seems to aggravate the pain.  She does get some relief with taking Tylenol.  Patient denies any modifying or aggravating factors.  Patient denies any recent UTI's, gross hematuria, dysuria or suprapubic/flank pain.  Patient denies any fevers, chills, nausea or vomiting.   CT renal stone (06/2022) -no nephrolithiasis.  Contrast CT (11/2022) - no stones, no hydro.  She is having a cysto with Dr. Lonna Cobb for hematuria on 09/11.    UA amber slightly cloudy, trace ketones, greater than 1.030 specific gravity, 3+ heme, pH 5.5, 2+ protein, 0-5 WBCs, 11-30 RBCs, 0-10 epithelial cells, granular casts present, hyaline cast present, mucus threads present and moderate bacteria.  PMH: Past Medical History:  Diagnosis Date   Abdominal bloating    Acute midline low back pain without sciatica 10/09/2018   Aortic atherosclerosis (HCC)    Arthritis    Breast cancer, left (HCC) 08/04/2014   a.) Bx (+) for invasive mammary carcinoma with DCIS; ER/PR (+), HER2/neu (-). (+) lymphovascular invasion  with 1 of 2 lymph nodes being (+). Treated with lumpectomy + adjuvant chemoradiation.   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 neoplasm. b.) Bx 02/23/2021 --> no dysplasia or malignancy; benign.   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 IHC for cytokeratin AE1/AE3 and CD56 supports.   Osteoporosis of lumbar spine 03/12/2015   2021 - Prolia recommended by Oncology -  to start once dental issues are resolved   Polyp of ascending colon    Polyp of descending colon    Polyp of sigmoid colon    Positive PPD, treated    PVD (peripheral vascular disease) with claudication (HCC)    Rectal polyp    T2DM (type 2 diabetes mellitus) (HCC) 06/2014    Surgical History: Past Surgical History:  Procedure Laterality Date   BIOPSY  11/11/2021   Procedure: BIOPSY;  Surgeon: Lemar Lofty., MD;  Location: [facility] ENDOSCOPY;  Service: Gastroenterology;;   BREAST BIOPSY Left 07/31/2014   IMC and DCIS     BREAST BIOPSY Left 03/08/2016   INTRADUCTAL PAPILLOMA    BREAST LUMPECTOMY Left 07/2014   IDC, DCIS, clear margins, positive LN   BREAST LUMPECTOMY Left 04/05/2016   excision of intraductal papilloma, no malignancy or atypia   BREAST LUMPECTOMY WITH NEEDLE LOCALIZATION Left 04/05/2016   Procedure: BREAST LUMPECTOMY WITH NEEDLE LOCALIZATION;  Surgeon: Gladis Riffle, MD;  Location: [facility] ORS;  Service: General;  Laterality: Left;   BREAST LUMPECTOMY WITH NEEDLE LOCALIZATION AND AXILLARY SENTINEL LYMPH NODE BX Left 08/27/14   CHOLECYSTECTOMY  09/07/2013   COLONOSCOPY WITH PROPOFOL N/A 08/28/2015   Procedure: COLONOSCOPY WITH PROPOFOL;  Surgeon: Midge Minium, MD;  Location: [facility];  Service: Endoscopy;  Laterality: N/A;  Diabetic oral   COLONOSCOPY WITH PROPOFOL N/A 08/14/2020   Procedure: COLONOSCOPY WITH PROPOFOL;  Surgeon: Pasty Spillers, MD;  Location: [facility] ENDOSCOPY;  Service: Endoscopy;  Laterality: N/A;   ENDOSCOPIC MUCOSAL RESECTION N/A 09/10/2020   Procedure: ENDOSCOPIC MUCOSAL RESECTION;  Surgeon: Meridee Score Netty Starring., MD;  Location: [facility] ENDOSCOPY;  Service: Gastroenterology;  Laterality: N/A;   ESOPHAGOGASTRODUODENOSCOPY  2014   gastritis   ESOPHAGOGASTRODUODENOSCOPY (EGD) WITH PROPOFOL N/A 04/16/2019   Procedure: ESOPHAGOGASTRODUODENOSCOPY (EGD) WITH PROPOFOL;  Surgeon: Pasty Spillers, MD;  Location: [facility] ENDOSCOPY;  Service: Endoscopy;  Laterality: N/A;   ESOPHAGOGASTRODUODENOSCOPY (EGD) WITH PROPOFOL N/A 10/03/2019   Procedure: ESOPHAGOGASTRODUODENOSCOPY (EGD) WITH PROPOFOL;  Surgeon: Pasty Spillers, MD;  Location: [facility] ENDOSCOPY;  Service: Endoscopy;  Laterality: N/A;   EUS N/A 09/10/2020   Procedure: LOWER ENDOSCOPIC ULTRASOUND (EUS);  Surgeon: Lemar Lofty., MD;  Location: [facility] ENDOSCOPY;  Service: Gastroenterology;  Laterality: N/A;   EUS N/A 11/11/2021   Procedure: LOWER ENDOSCOPIC ULTRASOUND (EUS);  Surgeon: Lemar Lofty., MD;   Location: [facility] ENDOSCOPY;  Service: Gastroenterology;  Laterality: N/A;   FLEXIBLE SIGMOIDOSCOPY N/A 08/27/2020   Procedure: FLEXIBLE SIGMOIDOSCOPY;  Surgeon: Pasty Spillers, MD;  Location: [facility] ENDOSCOPY;  Service: Endoscopy;  Laterality: N/A;   FLEXIBLE SIGMOIDOSCOPY N/A 09/10/2020   Procedure: FLEXIBLE SIGMOIDOSCOPY;  Surgeon: Meridee Score Netty Starring., MD;  Location: [facility] ENDOSCOPY;  Service: Gastroenterology;  Laterality: N/A;   FLEXIBLE SIGMOIDOSCOPY N/A 11/11/2021   Procedure: FLEXIBLE SIGMOIDOSCOPY;  Surgeon: Meridee Score Netty Starring., MD;  Location: [facility] ENDOSCOPY;  Service: Gastroenterology;  Laterality: N/A;   FOREIGN BODY REMOVAL  09/10/2020   Procedure: FOREIGN BODY REMOVAL ;  Surgeon: Meridee Score Netty Starring., MD;  Location: [facility] ENDOSCOPY;  Service: Gastroenterology;;   HEMOSTASIS CLIP PLACEMENT  09/10/2020   Procedure: HEMOSTASIS CLIP PLACEMENT;  Surgeon: Lemar Lofty., MD;  Location: [facility];  Service: Gastroenterology;;   HYSTEROSCOPY WITH D & C N/A 03/25/2021   Procedure: DILATATION AND CURETTAGE Melton Krebs;  Surgeon: Vena Austria, MD;  Location: [facility] ORS;  Service: Gynecology;  Laterality: N/A;   IR RADIOLOGIST EVAL & MGMT  01/03/2023   POLYPECTOMY  08/28/2015   Procedure: POLYPECTOMY INTESTINAL;  Surgeon: Midge Minium, MD;  Location: [facility];  Service: Endoscopy;;  Rectal polyp   SUBMUCOSAL LIFTING INJECTION  09/10/2020   Procedure: SUBMUCOSAL LIFTING INJECTION;  Surgeon: Lemar Lofty., MD;  Location: [facility] ENDOSCOPY;  Service: Gastroenterology;;   TUBAL LIGATION Bilateral    UMBILICAL HERNIA REPAIR N/A 08/09/2016   Procedure: HERNIA REPAIR UMBILICAL ADULT;  Surgeon: Henrene Dodge, MD;  Location: [facility] ORS;  Service: General;  Laterality: N/A;   VENTRAL HERNIA REPAIR N/A 04/10/2015   Procedure: HERNIA REPAIR VENTRAL ADULT;  Surgeon: Nadeen Landau, MD;  Location: [facility] ORS;  Service: General;  Laterality: N/A;    Home Medications:  Allergies as  of 01/26/2023   No Known Allergies      Medication List        Accurate as of January 26, 2023  2:38 PM. If you have any questions, ask your nurse or doctor.          Accu-Chek Guide test strip Generic drug: glucose blood TEST TWICE DAILY   acetaminophen 500 MG tablet Commonly known as: TYLENOL Take 1,000 mg by mouth every 6 (six) hours as needed for moderate pain.   albuterol 108 (90 Base) MCG/ACT inhaler Commonly known as: VENTOLIN HFA INHALE 2 PUFFS INTO THE LUNGS EVERY 6 HOURS AS NEEDED FOR WHEEZING OR SHORTNESS OF BREATH   amLODipine 10 MG tablet Commonly known as: NORVASC TAKE 1 TABLET(10 MG) BY MOUTH DAILY   aspirin EC 81 MG tablet Take 81 mg by mouth daily.   atorvastatin 10 MG tablet Commonly known as: LIPITOR Take 1 tablet (10 mg total) by mouth at bedtime.   irbesartan 300 MG tablet Commonly known as: AVAPRO TAKE 1 TABLET(300 MG) BY MOUTH DAILY   metFORMIN 500 MG 24 hr tablet Commonly known as: GLUCOPHAGE-XR TAKE 1 TABLET BY MOUTH EVERY MORNING WITH BREAKFAST   methocarbamol 500 MG tablet Commonly known as: ROBAXIN Take 500 mg by mouth at bedtime.   multivitamin with minerals tablet Take 1 tablet by mouth daily.   phenazopyridine 95 MG tablet Commonly known as: PYRIDIUM Take 1 tablet (95 mg total) by mouth 3 (three) times daily as needed for pain.   triamterene-hydrochlorothiazide 37.5-25 MG tablet Commonly known as: MAXZIDE-25 TAKE 1 TABLET BY MOUTH DAILY   True Metrix Level 1 Low Soln 1 each by In Vitro route daily as needed.   True Metrix Meter Devi 1 each by Does not apply route daily.        Allergies: No Known Allergies  Family History: Family History  Problem Relation Age of Onset   Cancer Mother 22       Pancreatic   Cancer Father        Throat dx 56s   Healthy Sister    Cancer Sister 60       unk metastatic   Kidney cancer Sister 34   HIV Brother    Breast cancer Maternal Grandmother 21   Breast cancer Cousin         d. 43s    Social History:  reports that she has been smoking cigarettes. She has a 22.5 pack-year smoking history. She has never used smokeless tobacco. She reports current alcohol use of about 2.0 standard drinks of alcohol per week. She reports that she does not use drugs.  ROS: Pertinent ROS in HPI  Physical Exam: BP (!) 144/78   Pulse 90   Ht 5\' 8"  (1.727 m)   Wt 202 lb (91.6 kg)   BMI 30.71  kg/m   Constitutional:  Well nourished. Alert and oriented, No acute distress. HEENT: [place] AT, moist mucus membranes.  Trachea midline, no masses. Cardiovascular: No clubbing, cyanosis, or edema. Respiratory: Normal respiratory effort, no increased work of breathing. Neurologic: Grossly intact, no focal deficits, moving all 4 extremities. Psychiatric: Normal mood and affect.  Laboratory Data: Lab Results  Component Value Date   WBC 7.5 01/11/2023   HGB 13.3 01/11/2023   HCT 42.8 01/11/2023   MCV 86.8 01/11/2023   PLT 247 01/11/2023   Lab Results  Component Value Date   CREATININE 0.76 01/11/2023   Lab Results  Component Value Date   HGBA1C 6.1 (A) 12/19/2022      Component Value Date/Time   CHOL 148 07/20/2022 1119   HDL 30 (L) 07/20/2022 1119   CHOLHDL 4.9 07/20/2022 1119   VLDL 59 (H) 07/20/2022 1119   LDLCALC 59 07/20/2022 1119   Lab Results  Component Value Date   AST 26 09/13/2022   Lab Results  Component Value Date   ALT 24 09/13/2022   Urinalysis Results for orders placed or performed in visit on 01/26/23  Microscopic Examination   Urine  Result Value Ref Range   WBC, UA 0-5 0 - 5 /hpf   RBC, Urine 11-30 (A) 0 - 2 /hpf   Epithelial Cells (non renal) 0-10 0 - 10 /hpf   Casts Present (A) None seen /lpf   Cast Type Granular casts (A) N/A   Mucus, UA Present (A) Not Estab.   Bacteria, UA Moderate (A) None seen/Few  Urinalysis, Complete  Result Value Ref Range   Specific Gravity, UA >1.030 (H) 1.005 - 1.030   pH, UA 5.5 5.0 - 7.5   Color, UA Amber (A)  Yellow   Appearance Ur Hazy (A) Clear   Leukocytes,UA Negative Negative   Protein,UA 2+ (A) Negative/Trace   Glucose, UA Negative Negative   Ketones, UA Trace (A) Negative   RBC, UA 3+ (A) Negative   Bilirubin, UA Negative Negative   Urobilinogen, Ur 0.2 0.2 - 1.0 mg/dL   Nitrite, UA Negative Negative   Microscopic Examination See below:      I have reviewed the labs.   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   RADIATION DOSE REDUCTION: This exam was performed according to the departmental dose-optimization program which includes automated exposure control, adjustment of the mA and/or kV according to patient size and/or use of iterative reconstruction technique.   CONTRAST:  OMNIPAQUE IOHEXOL 300 MG/ML  SOLN   COMPARISON:  CT abdomen and pelvis dated July 08, 2018 for   FINDINGS: Lower chest: No acute abnormality.   Hepatobiliary: No focal liver abnormality is seen. Status post cholecystectomy. No biliary dilatation.   Pancreas: Unremarkable. No pancreatic ductal dilatation or surrounding inflammatory changes.   Spleen: Normal in size without focal abnormality.   Adrenals/Urinary Tract: Bilateral adrenal glands are unremarkable. No hydronephrosis or nephrolithiasis. Enhancing lesion of the lower pole of the left kidney measuring 2.1 x 1.8 x 2.4 cm, previously measured 1.7 x 1.5 x 1.8 cm. Bladder is unremarkable.   Stomach/Bowel: Small hiatal hernia. Appendix appears normal. No evidence of bowel wall thickening, distention, or inflammatory changes.   Vascular/Lymphatic: Aortic atherosclerosis. No enlarged  abdominal or pelvic lymph nodes.   Reproductive: Uterus and  bilateral adnexa are unremarkable.   Other: No abdominal wall hernia or abnormality. No abdominopelvic ascites.   Musculoskeletal: No acute or significant osseous findings.   IMPRESSION: 1. Enhancing lesion of the lower pole of the left kidney measuring 2.4 cm increased in size when compared with the prior and, highly concerning for renal cell carcinoma. 2. No evidence of metastatic disease in the abdomen or pelvis. 3. Aortic Atherosclerosis (ICD10-I70.0).     Electronically Signed   By: Allegra Lai M.D.   On: 12/07/2022 12:17  CLINICAL DATA:  Flank pain   EXAM: CT ABDOMEN AND PELVIS WITHOUT CONTRAST   TECHNIQUE: Multidetector CT imaging of the abdomen and pelvis was performed following the standard protocol without IV contrast.   RADIATION DOSE REDUCTION: This exam was performed according to the departmental dose-optimization program which includes automated exposure control, adjustment of the mA and/or kV according to patient size and/or use of iterative reconstruction technique.   COMPARISON:  CT 07/08/2022   FINDINGS: Lower chest: No acute abnormality.   Hepatobiliary: Hepatic steatosis. Status post cholecystectomy. No biliary dilatation   Pancreas: Unremarkable. No pancreatic ductal dilatation or surrounding inflammatory changes.   Spleen: Normal in size without focal abnormality.   Adrenals/Urinary Tract: Adrenal glands are unremarkable. Kidneys are normal, without renal calculi, focal lesion, or hydronephrosis. Bladder is unremarkable.   Stomach/Bowel: Stomach is within normal limits. Appendix appears normal. No evidence of bowel wall thickening, distention, or inflammatory changes.   Vascular/Lymphatic: Mild aortic atherosclerosis. No aneurysm. No suspicious lymph nodes.   Reproductive: Uterus and bilateral adnexa are unremarkable.   Other: No abdominal wall hernia or abnormality. No abdominopelvic ascites.   Musculoskeletal: No acute or  significant osseous findings.   IMPRESSION: 1. No CT evidence for acute intra-abdominal or pelvic abnormality. Hepatic steatosis. 2. Aortic atherosclerosis.   Aortic Atherosclerosis (ICD10-I70.0).     Electronically Signed   By: Jasmine Pang M.D.   On: 07/18/2022 19:18 I have independently reviewed the films.  See HPI.   Assessment & Plan:    1. Right lower quadrant pain -I believe it is mostly muscle skeletal in nature as pelvic ultrasound and CT scans have not demonstrated an etiology for the pain and it is positional in nature -I advised her to follow-up with either PCP or seek orthopedics for their opinion, but she deferred and wanted to wait and see what happened after her cystoscopy appointment on 9/11 -I explained that is likely we will not find an etiology for her right lower quadrant pain during the cystoscopy, but is important to keep the appointment for that test nonetheless to evaluate the blood in her urine as she is at risk for possible bladder cancer  2. Left RCC -s/p cryo (12/2022)  -followed by IR  3. Hematuria -Reminded patient to keep her cystoscopy appointment on September 11 to complete her hematuria workup  Return for 9/11 for cysto .  These notes generated with voice recognition software. I apologize for typographical errors.  Cloretta Ned  [facility] [address]  [address] [place], [place] [address] [phone]

## 2023-01-26 ENCOUNTER — Ambulatory Visit: Payer: 59 | Admitting: Urology

## 2023-01-26 ENCOUNTER — Encounter: Payer: Self-pay | Admitting: Urology

## 2023-01-26 VITALS — BP 144/78 | HR 90 | Ht 68.0 in | Wt 202.0 lb

## 2023-01-26 DIAGNOSIS — R319 Hematuria, unspecified: Secondary | ICD-10-CM

## 2023-01-26 DIAGNOSIS — N2889 Other specified disorders of kidney and ureter: Secondary | ICD-10-CM

## 2023-01-26 DIAGNOSIS — Z85528 Personal history of other malignant neoplasm of kidney: Secondary | ICD-10-CM | POA: Diagnosis not present

## 2023-01-26 DIAGNOSIS — R1031 Right lower quadrant pain: Secondary | ICD-10-CM

## 2023-01-26 DIAGNOSIS — R109 Unspecified abdominal pain: Secondary | ICD-10-CM | POA: Diagnosis not present

## 2023-01-27 LAB — URINALYSIS, COMPLETE
Bilirubin, UA: NEGATIVE
Glucose, UA: NEGATIVE
Leukocytes,UA: NEGATIVE
Nitrite, UA: NEGATIVE
Specific Gravity, UA: >1.03 — ABNORMAL HIGH (ref 1.005–1.030)
Urobilinogen, Ur: 0.2 mg/dL (ref 0.2–1.0)
pH, UA: 5.5 (ref 5.0–7.5)

## 2023-01-27 LAB — MICROSCOPIC EXAMINATION

## 2023-01-31 ENCOUNTER — Ambulatory Visit
Admission: RE | Admit: 2023-01-31 | Discharge: 2023-01-31 | Disposition: A | Discharge status: Home / Self Care | Payer: 59 | Source: Ambulatory Visit | Attending: Interventional Radiology | Admitting: Interventional Radiology

## 2023-01-31 DIAGNOSIS — N2889 Other specified disorders of kidney and ureter: Secondary | ICD-10-CM

## 2023-01-31 HISTORY — PX: IR RADIOLOGIST EVAL & MGMT: IMG5224

## 2023-01-31 NOTE — Progress Notes (Signed)
IR brief note   I spoke to the patient this morning via telephone visit.  Last week, she had called the clinic complaining of pain radiating to her left groin.  As of this morning, she says the pain is nearly resolved.  I explained that there may have been transient irritation to the genitofemoral nerve which run along the psoas muscle.  On occasion, there may be irritation to this nerve with adjacent cryoablation that may last for several weeks.  Given her interval improvement, I expect this to completely resolve.  The patient complains of no other adverse effects following the cryoablation.  She reports otherwise being at her normal state of health.  The patient will continue follow-up with interventional radiology with repeat imaging at 6 months.    Olive Bass, MD  Vascular and Interventional Radiology 01/31/2023 8:58 AM

## 2023-02-07 NOTE — Addendum Note (Signed)
Encounter addended by: Juliette Mangle, RT on: 02/07/2023 9:56 AM  Actions taken: Imaging Exam ended, Charge Capture section accepted

## 2023-02-08 ENCOUNTER — Encounter: Payer: Self-pay | Admitting: Urology

## 2023-02-08 ENCOUNTER — Ambulatory Visit (INDEPENDENT_AMBULATORY_CARE_PROVIDER_SITE_OTHER): Payer: 59 | Admitting: Urology

## 2023-02-08 VITALS — BP 156/75 | HR 87 | Ht 68.0 in | Wt 202.0 lb

## 2023-02-08 DIAGNOSIS — R319 Hematuria, unspecified: Secondary | ICD-10-CM

## 2023-02-08 DIAGNOSIS — M47817 Spondylosis without myelopathy or radiculopathy, lumbosacral region: Secondary | ICD-10-CM | POA: Diagnosis not present

## 2023-02-08 DIAGNOSIS — R3129 Other microscopic hematuria: Secondary | ICD-10-CM

## 2023-02-08 LAB — URINALYSIS, COMPLETE
Bilirubin, UA: NEGATIVE
Glucose, UA: NEGATIVE
Ketones, UA: NEGATIVE
Leukocytes,UA: NEGATIVE
Nitrite, UA: NEGATIVE
Specific Gravity, UA: 1.02 (ref 1.005–1.030)
Urobilinogen, Ur: 0.2 mg/dL (ref 0.2–1.0)
pH, UA: 5.5 (ref 5.0–7.5)

## 2023-02-08 LAB — MICROSCOPIC EXAMINATION

## 2023-02-08 NOTE — Progress Notes (Signed)
   02/08/23  CC:  Chief Complaint  Patient presents with   Cysto    HPI: Refer to Carollee Herter McGowan's previous note 01/26/2023.  UA today 11-30 RBC  Blood pressure (!) 156/75, pulse 87, height 5\' 8"  (1.727 m), weight 202 lb (91.6 kg). NED. A&Ox3.   No respiratory distress   Abd soft, NT, ND Normal external genitalia with patent urethral meatus  Cystoscopy Procedure Note  Patient identification was confirmed, informed consent was obtained, and patient was prepped using Betadine solution.  Lidocaine jelly was administered per urethral meatus.    Procedure: - Flexible cystoscope introduced, without any difficulty.   - Thorough search of the bladder revealed:    normal urethral meatus    normal urothelium    no stones    no ulcers     no tumors    no urethral polyps    no trabeculation  - Ureteral orifices were normal in position and appearance.  Post-Procedure: - Patient tolerated the procedure well  Assessment/ Plan: No bladder mucosal abnormalities No GU etiology identified of her right back pain Recommend PCP follow-up for evaluation of possible musculoskeletal pain 82-month follow-up with urinalysis   Riki Altes, MD

## 2023-02-14 ENCOUNTER — Telehealth: Payer: 59

## 2023-02-28 ENCOUNTER — Other Ambulatory Visit: Payer: Self-pay | Admitting: Internal Medicine

## 2023-02-28 DIAGNOSIS — S92511A Displaced fracture of proximal phalanx of right lesser toe(s), initial encounter for closed fracture: Secondary | ICD-10-CM | POA: Diagnosis not present

## 2023-02-28 DIAGNOSIS — M545 Low back pain, unspecified: Secondary | ICD-10-CM | POA: Diagnosis not present

## 2023-02-28 DIAGNOSIS — E1169 Type 2 diabetes mellitus with other specified complication: Secondary | ICD-10-CM

## 2023-03-01 DIAGNOSIS — S92511A Displaced fracture of proximal phalanx of right lesser toe(s), initial encounter for closed fracture: Secondary | ICD-10-CM | POA: Diagnosis not present

## 2023-03-07 DIAGNOSIS — M545 Low back pain, unspecified: Secondary | ICD-10-CM | POA: Diagnosis not present

## 2023-03-14 DIAGNOSIS — M545 Low back pain, unspecified: Secondary | ICD-10-CM | POA: Diagnosis not present

## 2023-03-21 DIAGNOSIS — M545 Low back pain, unspecified: Secondary | ICD-10-CM | POA: Diagnosis not present

## 2023-03-23 DIAGNOSIS — E119 Type 2 diabetes mellitus without complications: Secondary | ICD-10-CM | POA: Diagnosis not present

## 2023-03-23 DIAGNOSIS — H35013 Changes in retinal vascular appearance, bilateral: Secondary | ICD-10-CM | POA: Diagnosis not present

## 2023-03-23 DIAGNOSIS — H04123 Dry eye syndrome of bilateral lacrimal glands: Secondary | ICD-10-CM | POA: Diagnosis not present

## 2023-03-23 LAB — HM DIABETES EYE EXAM

## 2023-03-27 DIAGNOSIS — M545 Low back pain, unspecified: Secondary | ICD-10-CM | POA: Diagnosis not present

## 2023-04-23 ENCOUNTER — Encounter: Payer: Self-pay | Admitting: Internal Medicine

## 2023-04-24 ENCOUNTER — Ambulatory Visit (INDEPENDENT_AMBULATORY_CARE_PROVIDER_SITE_OTHER): Payer: 59 | Admitting: Internal Medicine

## 2023-04-24 ENCOUNTER — Encounter: Payer: Self-pay | Admitting: Internal Medicine

## 2023-04-24 VITALS — BP 114/68 | HR 77 | Ht 68.0 in | Wt 210.0 lb

## 2023-04-24 DIAGNOSIS — I10 Essential (primary) hypertension: Secondary | ICD-10-CM | POA: Diagnosis not present

## 2023-04-24 DIAGNOSIS — E118 Type 2 diabetes mellitus with unspecified complications: Secondary | ICD-10-CM

## 2023-04-24 DIAGNOSIS — Z23 Encounter for immunization: Secondary | ICD-10-CM

## 2023-04-24 DIAGNOSIS — Z7984 Long term (current) use of oral hypoglycemic drugs: Secondary | ICD-10-CM

## 2023-04-24 LAB — POCT GLYCOSYLATED HEMOGLOBIN (HGB A1C): Hemoglobin A1C: 6.4 % — AB (ref 4.0–5.6)

## 2023-04-24 MED ORDER — TRIAMTERENE-HCTZ 37.5-25 MG PO TABS
1.0000 | ORAL_TABLET | Freq: Every day | ORAL | 1 refills | Status: DC
Start: 2023-04-24 — End: 2023-08-28

## 2023-04-24 MED ORDER — AMLODIPINE BESYLATE 10 MG PO TABS
10.0000 mg | ORAL_TABLET | Freq: Every day | ORAL | 1 refills | Status: DC
Start: 2023-04-24 — End: 2023-10-25

## 2023-04-24 MED ORDER — IRBESARTAN 300 MG PO TABS
300.0000 mg | ORAL_TABLET | Freq: Every day | ORAL | 1 refills | Status: DC
Start: 2023-04-24 — End: 2023-10-25

## 2023-04-24 MED ORDER — METFORMIN HCL ER 500 MG PO TB24
ORAL_TABLET | ORAL | 1 refills | Status: DC
Start: 2023-04-24 — End: 2023-10-25

## 2023-04-24 NOTE — Progress Notes (Signed)
Date:  04/24/2023   Name:  Donna Riley   DOB:  Apr 27, 1961   MRN:  [id number]   Chief Complaint: Diabetes and Hypertension  Diabetes She presents for her follow-up diabetic visit. She has type 2 diabetes mellitus. Her disease course has been stable. Pertinent negatives for hypoglycemia include no headaches, nervousness/anxiousness or tremors. Pertinent negatives for diabetes include no chest pain, no fatigue, no polydipsia and no polyuria. Current diabetic treatment includes oral agent (monotherapy) (metformin).  Hypertension This is a chronic problem. The problem is controlled. Pertinent negatives include no chest pain, headaches, palpitations or shortness of breath. Past treatments include calcium channel blockers, angiotensin blockers and diuretics. The current treatment provides significant improvement.    Review of Systems  Constitutional:  Negative for appetite change, fatigue, fever and unexpected weight change.  Eyes:  Negative for visual disturbance.  Respiratory:  Negative for cough, chest tightness and shortness of breath.   Cardiovascular:  Negative for chest pain, palpitations and leg swelling.  Gastrointestinal:  Negative for abdominal pain.  Endocrine: Negative for polydipsia and polyuria.  Genitourinary:  Negative for dysuria and hematuria.  Musculoskeletal:  Negative for arthralgias.  Neurological:  Negative for tremors, numbness and headaches.  Psychiatric/Behavioral:  Negative for dysphoric mood and sleep disturbance. The patient is not nervous/anxious.      Lab Results  Component Value Date   NA 141 01/11/2023   K 3.5 01/11/2023   CO2 26 01/11/2023   GLUCOSE 107 (H) 01/11/2023   BUN 14 01/11/2023   CREATININE 0.76 01/11/2023   CALCIUM 8.8 (L) 01/11/2023   GFRNONAA >60 01/11/2023   Lab Results  Component Value Date   CHOL 148 07/20/2022   HDL 30 (L) 07/20/2022   LDLCALC 59 07/20/2022   TRIG 293 (H) 07/20/2022   CHOLHDL 4.9 07/20/2022   Lab  Results  Component Value Date   TSH 1.765 04/11/2018   Lab Results  Component Value Date   HGBA1C 6.4 (A) 04/24/2023   Lab Results  Component Value Date   WBC 7.5 01/11/2023   HGB 13.3 01/11/2023   HCT 42.8 01/11/2023   MCV 86.8 01/11/2023   PLT 247 01/11/2023   Lab Results  Component Value Date   ALT 24 09/13/2022   AST 26 09/13/2022   ALKPHOS 93 09/13/2022   BILITOT 0.6 09/13/2022   No results found for: "25OHVITD2", "25OHVITD3", "VD25OH"   Patient Active Problem List   Diagnosis Date Noted   History of renal cell cancer 12/19/2022   History of breast cancer 11/07/2022   History of rectal cancer 11/07/2022   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   Lung nodule 03/07/2022   Centrilobular emphysema (HCC) 02/17/2022   Genetic testing 06/10/2021   Endometrial polyp    Benign neuroendocrine tumor of sigmoid colon    History of rectal polyps    Nodule of colon    Sebaceous cyst 08/11/2020   Osteopenia 07/19/2020   Breast pain, right 07/03/2020   Atherosclerosis of abdominal aorta (HCC) 02/06/2020   Tobacco use disorder, continuous 01/14/2020   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   Lumbosacral spondylosis without myelopathy 11/22/2018   Vertigo 10/09/2018   Hyperlipidemia associated with type 2 diabetes mellitus (HCC) 04/11/2018   Gastroparesis 04/11/2018   Long term (current) use of aromatase inhibitors 12/25/2017   Arthritis    Elevated LFTs    Essential hypertension    Positive PPD, treated    Irritable  bowel syndrome with both constipation and diarrhea 07/20/2015   Type II diabetes mellitus with complication (HCC) 06/22/2015   Pre-ulcerative calluses 06/22/2015   Neuropathy 06/22/2015   Hot flash, menopausal 04/09/2015   Atherosclerotic peripheral vascular disease with intermittent claudication (HCC) 03/25/2015   Malignant neoplasm of left breast in female, estrogen receptor  positive (HCC) 05/30/2014   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    No Known Allergies  Past Surgical History:  Procedure Laterality Date   BIOPSY  11/11/2021   Procedure: BIOPSY;  Surgeon: Lemar Lofty., MD;  Location: [facility] ENDOSCOPY;  Service: Gastroenterology;;   BREAST BIOPSY Left 07/31/2014   IMC and DCIS    BREAST BIOPSY Left 03/08/2016   INTRADUCTAL PAPILLOMA    BREAST LUMPECTOMY Left 07/2014   IDC, DCIS, clear margins, positive LN   BREAST LUMPECTOMY Left 04/05/2016   excision of intraductal papilloma, no malignancy or atypia   BREAST LUMPECTOMY WITH NEEDLE LOCALIZATION Left 04/05/2016   Procedure: BREAST LUMPECTOMY WITH NEEDLE LOCALIZATION;  Surgeon: Gladis Riffle, MD;  Location: [facility] ORS;  Service: General;  Laterality: Left;   BREAST LUMPECTOMY WITH NEEDLE LOCALIZATION AND AXILLARY SENTINEL LYMPH NODE BX Left 08/27/14   CHOLECYSTECTOMY  09/07/2013   COLONOSCOPY WITH PROPOFOL N/A 08/28/2015   Procedure: COLONOSCOPY WITH PROPOFOL;  Surgeon: Midge Minium, MD;  Location: [facility];  Service: Endoscopy;  Laterality: N/A;  Diabetic oral   COLONOSCOPY WITH PROPOFOL N/A 08/14/2020   Procedure: COLONOSCOPY WITH PROPOFOL;  Surgeon: Pasty Spillers, MD;  Location: [facility] ENDOSCOPY;  Service: Endoscopy;  Laterality: N/A;   ENDOSCOPIC MUCOSAL RESECTION N/A 09/10/2020   Procedure: ENDOSCOPIC MUCOSAL RESECTION;  Surgeon: Meridee Score Netty Starring., MD;  Location: [facility]COPY;  Service: Gastroenterology;  Laterality: N/A;   ESOPHAGOGASTRODUODENOSCOPY  2014   gastritis   ESOPHAGOGASTRODUODENOSCOPY (EGD) WITH PROPOFOL N/A 04/16/2019   Procedure: ESOPHAGOGASTRODUODENOSCOPY (EGD) WITH PROPOFOL;  Surgeon: Pasty Spillers, MD;  Location: [facility] ENDOSCOPY;  Service: Endoscopy;  Laterality: N/A;   ESOPHAGOGASTRODUODENOSCOPY (EGD) WITH PROPOFOL N/A 10/03/2019   Procedure: ESOPHAGOGASTRODUODENOSCOPY (EGD) WITH PROPOFOL;  Surgeon: Pasty Spillers, MD;  Location: [facility]  ENDOSCOPY;  Service: Endoscopy;  Laterality: N/A;   EUS N/A 09/10/2020   Procedure: LOWER ENDOSCOPIC ULTRASOUND (EUS);  Surgeon: Lemar Lofty., MD;  Location: [facility] ENDOSCOPY;  Service: Gastroenterology;  Laterality: N/A;   EUS N/A 11/11/2021   Procedure: LOWER ENDOSCOPIC ULTRASOUND (EUS);  Surgeon: Lemar Lofty., MD;  Location: [facility] ENDOSCOPY;  Service: Gastroenterology;  Laterality: N/A;   FLEXIBLE SIGMOIDOSCOPY N/A 08/27/2020   Procedure: FLEXIBLE SIGMOIDOSCOPY;  Surgeon: Pasty Spillers, MD;  Location: [facility] ENDOSCOPY;  Service: Endoscopy;  Laterality: N/A;   FLEXIBLE SIGMOIDOSCOPY N/A 09/10/2020   Procedure: FLEXIBLE SIGMOIDOSCOPY;  Surgeon: Meridee Score Netty Starring., MD;  Location: [facility] ENDOSCOPY;  Service: Gastroenterology;  Laterality: N/A;   FLEXIBLE SIGMOIDOSCOPY N/A 11/11/2021   Procedure: FLEXIBLE SIGMOIDOSCOPY;  Surgeon: Meridee Score Netty Starring., MD;  Location: [facility] ENDOSCOPY;  Service: Gastroenterology;  Laterality: N/A;   FOREIGN BODY REMOVAL  09/10/2020   Procedure: FOREIGN BODY REMOVAL ;  Surgeon: Meridee Score Netty Starring., MD;  Location: [facility] ENDOSCOPY;  Service: Gastroenterology;;   HEMOSTASIS CLIP PLACEMENT  09/10/2020   Procedure: HEMOSTASIS CLIP PLACEMENT;  Surgeon: Lemar Lofty., MD;  Location: [facility] ENDOSCOPY;  Service: Gastroenterology;;   HYSTEROSCOPY WITH D & C N/A 03/25/2021   Procedure: DILATATION AND CURETTAGE Melton Krebs;  Surgeon: Vena Austria, MD;  Location: [facility] ORS;  Service: Gynecology;  Laterality: N/A;   IR RADIOLOGIST EVAL & MGMT  01/03/2023  IR RADIOLOGIST EVAL & MGMT  01/31/2023   POLYPECTOMY  08/28/2015   Procedure: POLYPECTOMY INTESTINAL;  Surgeon: Midge Minium, MD;  Location: [facility] SUR[facility]ERY [facility];  Service: Endoscopy;;  Rectal polyp   SUBMUCOSAL LIFTING INJECTION  09/10/2020   Procedure: SUBMUCOSAL LIFTING INJECTION;  Surgeon: Lemar Lofty., MD;  Location: [facility] ENDOSCOPY;  Service: Gastroenterology;;   TUBAL LIGATION Bilateral     UMBILICAL HERNIA REPAIR N/A 08/09/2016   Procedure: HERNIA REPAIR UMBILICAL ADULT;  Surgeon: Henrene Dodge, MD;  Location: [facility] ORS;  Service: General;  Laterality: N/A;   VENTRAL HERNIA REPAIR N/A 04/10/2015   Procedure: HERNIA REPAIR VENTRAL ADULT;  Surgeon: Nadeen Landau, MD;  Location: [facility] ORS;  Service: General;  Laterality: N/A;    Social History   Tobacco Use   Smoking status: Every Day    Current packs/day: 0.50    Average packs/day: 0.5 packs/day for 45.0 years (22.5 ttl pk-yrs)    Types: Cigarettes   Smokeless tobacco: Never   Tobacco comments:    Smokes 7 ciggs daily.  Vaping Use   Vaping status: Never Used  Substance Use Topics   Alcohol use: Yes    Alcohol/week: 2.0 standard drinks of alcohol    Types: 2 Cans of beer per week    Comment: weekends - beer    Drug use: No     Medication list has been reviewed and updated.  Current Meds  Medication Sig   acetaminophen (TYLENOL) 500 MG tablet Take 1,000 mg by mouth every 6 (six) hours as needed for moderate pain.   albuterol (VENTOLIN HFA) 108 (90 Base) MCG/ACT inhaler INHALE 2 PUFFS INTO THE LUNGS EVERY 6 HOURS AS NEEDED FOR WHEEZING OR SHORTNESS OF BREATH   aspirin EC 81 MG tablet Take 81 mg by mouth daily.   atorvastatin (LIPITOR) 10 MG tablet TAKE 1 TABLET(10 MG) BY MOUTH AT BEDTIME   Blood Glucose Calibration (TRUE METRIX LEVEL 1) Low SOLN 1 each by In Vitro route daily as needed.   Blood Glucose Monitoring Suppl (TRUE METRIX METER) DEVI 1 each by Does not apply route daily.   glucose blood (ACCU-CHEK GUIDE) test strip TEST TWICE DAILY   methocarbamol (ROBAXIN) 500 MG tablet Take 500 mg by mouth at bedtime.   Multiple Vitamins-Minerals (MULTIVITAMIN WITH MINERALS) tablet Take 1 tablet by mouth daily.   phenazopyridine (PYRIDIUM) 95 MG tablet Take 1 tablet (95 mg total) by mouth 3 (three) times daily as needed for pain.   [DISCONTINUED] amLODipine (NORVASC) 10 MG tablet TAKE 1 TABLET(10 MG) BY MOUTH DAILY    [DISCONTINUED] irbesartan (AVAPRO) 300 MG tablet TAKE 1 TABLET(300 MG) BY MOUTH DAILY   [DISCONTINUED] metFORMIN (GLUCOPHAGE-XR) 500 MG 24 hr tablet TAKE 1 TABLET BY MOUTH EVERY MORNING WITH BREAKFAST   [DISCONTINUED] triamterene-hydrochlorothiazide (MAXZIDE-25) 37.5-25 MG tablet TAKE 1 TABLET BY MOUTH DAILY       04/24/2023   11:07 AM 12/19/2022   11:00 AM 07/20/2022   10:35 AM 03/17/2022    9:27 AM  GAD 7 : Generalized Anxiety Score  Nervous, Anxious, on Edge 0 1 0 0  Control/stop worrying 0 0 0 1  Worry too much - different things 0 1 0 1  Trouble relaxing 0 0 0 0  Restless 0 0 1 1  Easily annoyed or irritable 0 0 1 1  Afraid - awful might happen 0 1 1 0  Total GAD 7 Score 0 3 3 4   Anxiety Difficulty Not difficult at all Not difficult at all  Not difficult at all Not difficult at all       04/24/2023   11:06 AM 12/19/2022   11:00 AM 11/10/2022    8:22 AM  Depression screen PHQ 2/9  Decreased Interest 0 2 0  Down, Depressed, Hopeless 0 2 0  PHQ - 2 Score 0 4 0  Altered sleeping 1 2 0  Tired, decreased energy 0 2 0  Change in appetite 0 0 0  Feeling bad or failure about yourself  0 0 0  Trouble concentrating 0 0 0  Moving slowly or fidgety/restless 0 0 0  Suicidal thoughts 0 0 0  PHQ-9 Score 1 8 0  Difficult doing work/chores Not difficult at all Not difficult at all Not difficult at all    BP Readings from Last 3 Encounters:  04/24/23 114/68  02/08/23 (!) 156/75  01/26/23 (!) 144/78    Physical Exam Vitals and nursing note reviewed.  Constitutional:      General: She is not in acute distress.    Appearance: Normal appearance. She is well-developed.  HENT:     Head: Normocephalic and atraumatic.  Neck:     Vascular: No carotid bruit.  Cardiovascular:     Rate and Rhythm: Normal rate and regular rhythm.     Heart sounds: No murmur heard. Pulmonary:     Effort: Pulmonary effort is normal. No respiratory distress.     Breath sounds: No wheezing or rhonchi.   Musculoskeletal:     Cervical back: Normal range of motion.     Right lower leg: No edema.     Left lower leg: No edema.  Lymphadenopathy:     Cervical: No cervical adenopathy.  Skin:    General: Skin is warm and dry.     Capillary Refill: Capillary refill takes less than 2 seconds.     Findings: No rash.  Neurological:     General: No focal deficit present.     Mental Status: She is alert and oriented to person, place, and time.  Psychiatric:        Mood and Affect: Mood normal.        Behavior: Behavior normal.    Diabetic Foot Exam - Simple   Simple Foot Form Diabetic Foot exam was performed with the following findings: Yes 04/24/2023 11:37 AM  Visual Inspection No deformities, no ulcerations, no other skin breakdown bilaterally: Yes Sensation Testing Intact to touch and monofilament testing bilaterally: Yes Pulse Check Posterior Tibialis and Dorsalis pulse intact bilaterally: Yes Comments Bilateral hammertoes       Wt Readings from Last 3 Encounters:  04/24/23 210 lb (95.3 kg)  02/08/23 202 lb (91.6 kg)  01/26/23 202 lb (91.6 kg)    BP 114/68   Pulse 77   Ht 5\' 8"  (1.727 m)   Wt 210 lb (95.3 kg)   SpO2 97%   BMI 31.93 kg/m   Assessment and Plan:  Problem List Items Addressed This Visit       Unprioritized   Essential hypertension (Chronic)    Controlled BP with normal exam. Current regimen is amlodipine, Avapro and hctz. Will continue same medications; encourage continued reduced sodium diet.       Relevant Medications   amLODipine (NORVASC) 10 MG tablet   irbesartan (AVAPRO) 300 MG tablet   triamterene-hydrochlorothiazide (MAXZIDE-25) 37.5-25 MG tablet   Type II diabetes mellitus with complication (HCC) - Primary (Chronic)    Blood sugars stable without hypoglycemic symptoms or events. Currently managed with metformin. Changes  made last visit are none. Lab Results  Component Value Date   HGBA1C 6.4 (A) 04/24/2023  A1C slightly higher  today but still acceptable.       Relevant Medications   irbesartan (AVAPRO) 300 MG tablet   metFORMIN (GLUCOPHAGE-XR) 500 MG 24 hr tablet   Other Relevant Orders   POCT HgB A1C (Completed)   Other Visit Diagnoses     Need for influenza vaccination       Relevant Orders   Flu vaccine trivalent PF, 6mos and older(Flulaval,Afluria,Fluarix,Fluzone) (Completed)   Long term current use of oral hypoglycemic drug           Return in about 4 months (around 08/22/2023) for CPX.    Reubin Milan, MD [facility] [place]

## 2023-04-24 NOTE — Assessment & Plan Note (Signed)
Controlled BP with normal exam. Current regimen is amlodipine, Avapro and hctz. Will continue same medications; encourage continued reduced sodium diet.

## 2023-04-24 NOTE — Assessment & Plan Note (Signed)
Blood sugars stable without hypoglycemic symptoms or events. Currently managed with metformin. Changes made last visit are none. Lab Results  Component Value Date   HGBA1C 6.4 (A) 04/24/2023  A1C slightly higher today but still acceptable.

## 2023-05-03 ENCOUNTER — Ambulatory Visit (INDEPENDENT_AMBULATORY_CARE_PROVIDER_SITE_OTHER): Payer: 59 | Admitting: Internal Medicine

## 2023-05-03 ENCOUNTER — Encounter: Payer: Self-pay | Admitting: Internal Medicine

## 2023-05-03 ENCOUNTER — Ambulatory Visit: Payer: Self-pay

## 2023-05-03 ENCOUNTER — Other Ambulatory Visit: Payer: Self-pay | Admitting: Internal Medicine

## 2023-05-03 VITALS — BP 140/78 | HR 82 | Ht 68.0 in | Wt 210.0 lb

## 2023-05-03 DIAGNOSIS — R350 Frequency of micturition: Secondary | ICD-10-CM

## 2023-05-03 LAB — POCT URINALYSIS DIPSTICK
Bilirubin, UA: NEGATIVE
Clarity, UA: NEGATIVE
Glucose, UA: NEGATIVE
Ketones, UA: NEGATIVE
Leukocytes, UA: NEGATIVE
Nitrite, UA: NEGATIVE
Protein, UA: NEGATIVE
Spec Grav, UA: 1.01 (ref 1.010–1.025)
Urobilinogen, UA: 0.2 U/dL
pH, UA: 6 (ref 5.0–8.0)

## 2023-05-03 MED ORDER — SULFAMETHOXAZOLE-TRIMETHOPRIM 800-160 MG PO TABS: 1.0000 | ORAL_TABLET | Freq: Two times a day (BID) | ORAL | 0 refills | Status: AC

## 2023-05-03 NOTE — Progress Notes (Signed)
Date:  05/03/2023   Name:  Donna Riley   DOB:  April 12, 1961   MRN:  [id number]   Chief Complaint: Urinary Frequency (Right side pain, burning, odor )  Dysuria  This is a new problem. The current episode started yesterday. The problem occurs every urination. The quality of the pain is described as burning. The pain is mild. There has been no fever. Associated symptoms include frequency. Pertinent negatives include no chills, nausea or vomiting.    Medication list has been reviewed and updated.  Current Meds  Medication Sig   acetaminophen (TYLENOL) 500 MG tablet Take 1,000 mg by mouth every 6 (six) hours as needed for moderate pain.   albuterol (VENTOLIN HFA) 108 (90 Base) MCG/ACT inhaler INHALE 2 PUFFS INTO THE LUNGS EVERY 6 HOURS AS NEEDED FOR WHEEZING OR SHORTNESS OF BREATH   amLODipine (NORVASC) 10 MG tablet Take 1 tablet (10 mg total) by mouth daily.   aspirin EC 81 MG tablet Take 81 mg by mouth daily.   atorvastatin (LIPITOR) 10 MG tablet TAKE 1 TABLET(10 MG) BY MOUTH AT BEDTIME   Blood Glucose Calibration (TRUE METRIX LEVEL 1) Low SOLN 1 each by In Vitro route daily as needed.   Blood Glucose Monitoring Suppl (TRUE METRIX METER) DEVI 1 each by Does not apply route daily.   glucose blood (ACCU-CHEK GUIDE) test strip TEST TWICE DAILY   irbesartan (AVAPRO) 300 MG tablet Take 1 tablet (300 mg total) by mouth daily.   metFORMIN (GLUCOPHAGE-XR) 500 MG 24 hr tablet TAKE 1 TABLET BY MOUTH EVERY MORNING WITH BREAKFAST   methocarbamol (ROBAXIN) 500 MG tablet Take 500 mg by mouth at bedtime.   Multiple Vitamins-Minerals (MULTIVITAMIN WITH MINERALS) tablet Take 1 tablet by mouth daily.   phenazopyridine (PYRIDIUM) 95 MG tablet Take 1 tablet (95 mg total) by mouth 3 (three) times daily as needed for pain.   sulfamethoxazole-trimethoprim (BACTRIM DS) 800-160 MG tablet Take 1 tablet by mouth 2 (two) times daily for 7 days.   triamterene-hydrochlorothiazide (MAXZIDE-25) 37.5-25 MG tablet Take  1 tablet by mouth daily.     Review of Systems  Constitutional:  Negative for chills, fatigue and fever.  Respiratory:  Negative for chest tightness and shortness of breath.   Cardiovascular:  Negative for chest pain.  Gastrointestinal:  Positive for abdominal pain. Negative for diarrhea, nausea and vomiting.  Genitourinary:  Positive for dysuria and frequency.  Psychiatric/Behavioral:  Negative for dysphoric mood and sleep disturbance. The patient is not nervous/anxious.     Patient Active Problem List   Diagnosis Date Noted   History of renal cell cancer 12/19/2022   History of breast cancer 11/07/2022   History of rectal cancer 11/07/2022   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   Lung nodule 03/07/2022   Centrilobular emphysema (HCC) 02/17/2022   Genetic testing 06/10/2021   Endometrial polyp    Benign neuroendocrine tumor of sigmoid colon    History of rectal polyps    Nodule of colon    Sebaceous cyst 08/11/2020   Osteopenia 07/19/2020   Breast pain, right 07/03/2020   Atherosclerosis of abdominal aorta (HCC) 02/06/2020   Tobacco use disorder, continuous 01/14/2020   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   Lumbosacral spondylosis without myelopathy 11/22/2018   Vertigo 10/09/2018   Hyperlipidemia associated with type 2 diabetes mellitus (HCC) 04/11/2018   Gastroparesis 04/11/2018   Long term (current) use of aromatase inhibitors 12/25/2017  Arthritis    Elevated LFTs    Essential hypertension    Positive PPD, treated    Irritable bowel syndrome with both constipation and diarrhea 07/20/2015   Type II diabetes mellitus with complication (HCC) 06/22/2015   Pre-ulcerative calluses 06/22/2015   Neuropathy 06/22/2015   Hot flash, menopausal 04/09/2015   Atherosclerotic peripheral vascular disease with intermittent claudication (HCC) 03/25/2015   Malignant neoplasm of left breast in  female, estrogen receptor positive (HCC) 05/30/2014   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    No Known Allergies  Immunization History  Administered Date(s) Administered   Influenza Inj Mdck Quad With Preservative 03/12/2018   Influenza, Seasonal, Injecte, Preservative Fre 04/24/2023   Influenza,inj,Quad PF,6+ Mos 02/22/2016, 02/18/2019, 03/05/2021, 03/17/2022   Influenza-Unspecified 01/30/2014, 02/25/2017, 03/12/2018   PFIZER(Purple Top)SARS-COV-2 Vaccination 10/21/2019, 11/13/2019   PNEUMOCOCCAL CONJUGATE-20 11/03/2021   Pneumococcal Polysaccharide-23 01/25/2017   Zoster Recombinant(Shingrix) 07/09/2021, 10/03/2021    Past Surgical History:  Procedure Laterality Date   BIOPSY  11/11/2021   Procedure: BIOPSY;  Surgeon: Lemar Lofty., MD;  Location: [facility] ENDOSCOPY;  Service: Gastroenterology;;   BREAST BIOPSY Left 07/31/2014   IMC and DCIS    BREAST BIOPSY Left 03/08/2016   INTRADUCTAL PAPILLOMA    BREAST LUMPECTOMY Left 07/2014   IDC, DCIS, clear margins, positive LN   BREAST LUMPECTOMY Left 04/05/2016   excision of intraductal papilloma, no malignancy or atypia   BREAST LUMPECTOMY WITH NEEDLE LOCALIZATION Left 04/05/2016   Procedure: BREAST LUMPECTOMY WITH NEEDLE LOCALIZATION;  Surgeon: Gladis Riffle, MD;  Location: [facility] ORS;  Service: General;  Laterality: Left;   BREAST LUMPECTOMY WITH NEEDLE LOCALIZATION AND AXILLARY SENTINEL LYMPH NODE BX Left 08/27/14   CHOLECYSTECTOMY  09/07/2013   COLONOSCOPY WITH PROPOFOL N/A 08/28/2015   Procedure: COLONOSCOPY WITH PROPOFOL;  Surgeon: Midge Minium, MD;  Location: [facility] SURGERY [facility];  Service: Endoscopy;  Laterality: N/A;  Diabetic oral   COLONOSCOPY WITH PROPOFOL N/A 08/14/2020   Procedure: COLONOSCOPY WITH PROPOFOL;  Surgeon: Pasty Spillers, MD;  Location: [facility] ENDOSCOPY;  Service: Endoscopy;  Laterality: N/A;   ENDOSCOPIC MUCOSAL RESECTION N/A 09/10/2020   Procedure: ENDOSCOPIC MUCOSAL RESECTION;  Surgeon:  Meridee Score Netty Starring., MD;  Location: [facility] ENDOSCOPY;  Service: Gastroenterology;  Laterality: N/A;   ESOPHAGOGASTRODUODENOSCOPY  2014   gastritis   ESOPHAGOGASTRODUODENOSCOPY (EGD) WITH PROPOFOL N/A 04/16/2019   Procedure: ESOPHAGOGASTRODUODENOSCOPY (EGD) WITH PROPOFOL;  Surgeon: Pasty Spillers, MD;  Location: [facility] ENDOSCOPY;  Service: Endoscopy;  Laterality: N/A;   ESOPHAGOGASTRODUODENOSCOPY (EGD) WITH PROPOFOL N/A 10/03/2019   Procedure: ESOPHAGOGASTRODUODENOSCOPY (EGD) WITH PROPOFOL;  Surgeon: Pasty Spillers, MD;  Location: [facility] ENDOSCOPY;  Service: Endoscopy;  Laterality: N/A;   EUS N/A 09/10/2020   Procedure: LOWER ENDOSCOPIC ULTRASOUND (EUS);  Surgeon: Lemar Lofty., MD;  Location: [facility] ENDOSCOPY;  Service: Gastroenterology;  Laterality: N/A;   EUS N/A 11/11/2021   Procedure: LOWER ENDOSCOPIC ULTRASOUND (EUS);  Surgeon: Lemar Lofty., MD;  Location: [facility] ENDOSCOPY;  Service: Gastroenterology;  Laterality: N/A;   FLEXIBLE SIGMOIDOSCOPY N/A 08/27/2020   Procedure: FLEXIBLE SIGMOIDOSCOPY;  Surgeon: Pasty Spillers, MD;  Location: [facility] ENDOSCOPY;  Service: Endoscopy;  Laterality: N/A;   FLEXIBLE SIGMOIDOSCOPY N/A 09/10/2020   Procedure: FLEXIBLE SIGMOIDOSCOPY;  Surgeon: Meridee Score Netty Starring., MD;  Location: [facility] ENDOSCOPY;  Service: Gastroenterology;  Laterality: N/A;   FLEXIBLE SIGMOIDOSCOPY N/A 11/11/2021   Procedure: FLEXIBLE SIGMOIDOSCOPY;  Surgeon: Meridee Score Netty Starring., MD;  Location: [facility] ENDOSCOPY;  Service: Gastroenterology;  Laterality: N/A;   FOREIGN BODY REMOVAL  09/10/2020  Procedure: FOREIGN BODY REMOVAL ;  Surgeon: Meridee Score Netty Starring., MD;  Location: [facility] ENDOSCOPY;  Service: Gastroenterology;;   HEMOSTASIS CLIP PLACEMENT  09/10/2020   Procedure: HEMOSTASIS CLIP PLACEMENT;  Surgeon: Lemar Lofty., MD;  Location[facility] ENDOSCOPY;  Service: Gastroenterology;;   HYSTEROSCOPY WITH D & C N/A 03/25/2021   Procedure: DILATATION AND CURETTAGE  Melton Krebs;  Surgeon: Vena Austria, MD;  Location: [facility] ORS;  Service: Gynecology;  Laterality: N/A;   IR RADIOLOGIST EVAL & MGMT  01/03/2023   IR RADIOLOGIST EVAL & MGMT  01/31/2023   POLYPECTOMY  08/28/2015   Procedure: POLYPECTOMY INTESTINAL;  Surgeon: Midge Minium, MD;  Location: [facility] SURGERY [facility];  Service: Endoscopy;;  Rectal polyp   SUBMUCOSAL LIFTING INJECTION  09/10/2020   Procedure: SUBMUCOSAL LIFTING INJECTION;  Surgeon: Lemar Lofty., MD;  Location: [facility] ENDOSCOPY;  Service: Gastroenterology;;   TUBAL LIGATION Bilateral    UMBILICAL HERNIA REPAIR N/A 08/09/2016   Procedure: HERNIA REPAIR UMBILICAL ADULT;  Surgeon: Henrene Dodge, MD;  Location: [facility] ORS;  Service: General;  Laterality: N/A;   VENTRAL HERNIA REPAIR N/A 04/10/2015   Procedure: HERNIA REPAIR VENTRAL ADULT;  Surgeon: Nadeen Landau, MD;  Location: [facility] ORS;  Service: General;  Laterality: N/A;    Social History   Tobacco Use   Smoking status: Every Day    Current packs/day: 0.50    Average packs/day: 0.5 packs/day for 45.0 years (22.5 ttl pk-yrs)    Types: Cigarettes   Smokeless tobacco: Never   Tobacco comments:    Smokes 7 ciggs daily.  Vaping Use   Vaping status: Never Used  Substance Use Topics   Alcohol use: Yes    Alcohol/week: 2.0 standard drinks of alcohol    Types: 2 Cans of beer per week    Comment: weekends - beer    Drug use: No    Family History  Problem Relation Age of Onset   Cancer Mother 44       Pancreatic   Cancer Father        Throat dx 41s   Healthy Sister    Cancer Sister 76       unk metastatic   Kidney cancer Sister 41   HIV Brother    Breast cancer Maternal Grandmother 41   Breast cancer Cousin        d. 40s        05/03/2023    2:08 PM 04/24/2023   11:07 AM 12/19/2022   11:00 AM 07/20/2022   10:35 AM  GAD 7 : Generalized Anxiety Score  Nervous, Anxious, on Edge 0 0 1 0  Control/stop worrying 0 0 0 0  Worry too much - different things 0 0 1 0   Trouble relaxing 0 0 0 0  Restless 0 0 0 1  Easily annoyed or irritable 0 0 0 1  Afraid - awful might happen 0 0 1 1  Total GAD 7 Score 0 0 3 3  Anxiety Difficulty Not difficult at all Not difficult at all Not difficult at all Not difficult at all       05/03/2023    2:08 PM 04/24/2023   11:06 AM 12/19/2022   11:00 AM  Depression screen PHQ 2/9  Decreased Interest 0 0 2  Down, Depressed, Hopeless 0 0 2  PHQ - 2 Score 0 0 4  Altered sleeping 2 1 2   Tired, decreased energy 0 0 2  Change in appetite 0 0 0  Feeling bad or failure about  yourself  0 0 0  Trouble concentrating 0 0 0  Moving slowly or fidgety/restless 0 0 0  Suicidal thoughts 0 0 0  PHQ-9 Score 2 1 8   Difficult doing work/chores Not difficult at all Not difficult at all Not difficult at all    BP Readings from Last 3 Encounters:  05/03/23 (!) 140/78  04/24/23 114/68  02/08/23 (!) 156/75    Wt Readings from Last 3 Encounters:  05/03/23 210 lb (95.3 kg)  04/24/23 210 lb (95.3 kg)  02/08/23 202 lb (91.6 kg)    BP (!) 140/78 (BP Location: Left Arm, Cuff Size: Large)   Pulse 82   Ht 5\' 8"  (1.727 m)   Wt 210 lb (95.3 kg)   SpO2 97%   BMI 31.93 kg/m   Physical Exam Vitals and nursing note reviewed.  Constitutional:      General: She is not in acute distress.    Appearance: Normal appearance. She is well-developed.  HENT:     Head: Normocephalic and atraumatic.  Cardiovascular:     Rate and Rhythm: Normal rate and regular rhythm.  Pulmonary:     Effort: Pulmonary effort is normal. No respiratory distress.     Breath sounds: No wheezing or rhonchi.  Abdominal:     General: There is no distension.     Palpations: Abdomen is soft.     Tenderness: There is abdominal tenderness in the right lower quadrant. There is no guarding or rebound.  Skin:    General: Skin is warm and dry.     Findings: No rash.  Neurological:     Mental Status: She is alert and oriented to person, place, and time.  Psychiatric:         Mood and Affect: Mood normal.        Behavior: Behavior normal.    Lab Results  Component Value Date   COLORU dark yellow,clear 05/03/2023   CLARITYU neg 05/03/2023   GLUCOSEUR Negative 05/03/2023   BILIRUBINUR neg 05/03/2023   KETONESU neg 05/03/2023   SPECGRAV 1.010 05/03/2023   RBCUR large (A) 05/03/2023   PHUR 6.0 05/03/2023   PROTEINUR Negative 05/03/2023   UROBILINOGEN 0.2 05/03/2023   LEUKOCYTESUR Negative 05/03/2023     Recent Labs     Component Value Date/Time   NA 141 01/11/2023 1024   NA 145 (H) 05/26/2017 0928   NA 137 09/09/2014 1106   K 3.5 01/11/2023 1024   K 4.3 09/09/2014 1106   CL 106 01/11/2023 1024   CL 105 09/09/2014 1106   CO2 26 01/11/2023 1024   CO2 24 09/09/2014 1106   GLUCOSE 107 (H) 01/11/2023 1024   GLUCOSE 113 (H) 09/09/2014 1106   BUN 14 01/11/2023 1024   BUN 8 05/26/2017 0928   BUN 11 09/09/2014 1106   CREATININE 0.76 01/11/2023 1024   CREATININE 0.86 09/13/2022 1014   CREATININE 0.67 09/09/2014 1106   CALCIUM 8.8 (L) 01/11/2023 1024   CALCIUM 9.1 09/09/2014 1106   PROT 7.9 09/13/2022 1014   PROT 7.1 05/26/2017 0928   PROT 7.9 09/09/2014 1106   ALBUMIN 4.2 09/13/2022 1014   ALBUMIN 4.4 05/26/2017 0928   ALBUMIN 4.2 09/09/2014 1106   AST 26 09/13/2022 1014   ALT 24 09/13/2022 1014   ALT 41 09/09/2014 1106   ALKPHOS 93 09/13/2022 1014   ALKPHOS 96 09/09/2014 1106   BILITOT 0.6 09/13/2022 1014   GFRNONAA >60 01/11/2023 1024   GFRNONAA >60 09/13/2022 1014   GFRNONAA >60 09/09/2014  1106   GFRAA >60 02/11/2020 0844   GFRAA >60 09/09/2014 1106    Lab Results  Component Value Date   WBC 7.5 01/11/2023   HGB 13.3 01/11/2023   HCT 42.8 01/11/2023   MCV 86.8 01/11/2023   PLT 247 01/11/2023   Lab Results  Component Value Date   HGBA1C 6.4 (A) 04/24/2023   Lab Results  Component Value Date   CHOL 148 07/20/2022   HDL 30 (L) 07/20/2022   LDLCALC 59 07/20/2022   TRIG 293 (H) 07/20/2022   CHOLHDL 4.9 07/20/2022    Lab Results  Component Value Date   TSH 1.765 04/11/2018     Assessment and Plan: Problem List Items Addressed This Visit   None Visit Diagnoses     Urinary frequency    -  Primary   symptoms prolonged which may explain the clear UA except for blood will treat empirically send Urine for culture   Relevant Medications   sulfamethoxazole-trimethoprim (BACTRIM DS) 800-160 MG tablet   Other Relevant Orders   POCT urinalysis dipstick (Completed)   Urine Culture      Bari Edward, MD

## 2023-05-03 NOTE — Telephone Encounter (Signed)
  Chief Complaint: R hip pain Symptoms: R lower hip pain, urinary frequency Frequency: 1 week  Pertinent Negatives: Patient denies dysuria Disposition: [] ED /[] Urgent Care (no appt availability in office) / [x] Appointment(In office/virtual)/ []  [place] [facility]/ [] Home Care/ [] Refused Recommended Disposition /[] [place] [facility]/ []  Follow-up with PCP Additional Notes: pt states her R side been hurting, started out intermittent but now constant. Scheduled OV today at 1400 with PCP.   Reason for Disposition  [1] MODERATE pain (e.g., interferes with normal activities, limping) AND [2] present > 3 days  Answer Assessment - Initial Assessment Questions 1. LOCATION and RADIATION: "Where is the pain located?"      R side pain in lower area 2. QUALITY: "What does the pain feel like?"  (e.g., sharp, dull, aching, burning)     Cramping  3. SEVERITY: "How bad is the pain?" "What does it keep you from doing?"   (Scale 1-10; or mild, moderate, severe)   -  MILD (1-3): doesn't interfere with normal activities    -  MODERATE (4-7): interferes with normal activities (e.g., work or school) or awakens from sleep, limping    -  SEVERE (8-10): excruciating pain, unable to do any normal activities, unable to walk     9/10 4. ONSET: "When did the pain start?" "Does it come and go, or is it there all the time?"     1 week  7. AGGRAVATING FACTORS: "What makes the hip pain worse?" (e.g., walking, climbing stairs, running)     no 8. OTHER SYMPTOMS: "Do you have any other symptoms?" (e.g., back pain, pain shooting down leg,  fever, rash)     Urinary frequency  Protocols used: Hip Pain-A-AH

## 2023-05-04 ENCOUNTER — Other Ambulatory Visit: Payer: Self-pay | Admitting: Internal Medicine

## 2023-05-04 ENCOUNTER — Ambulatory Visit: Payer: Self-pay

## 2023-05-04 DIAGNOSIS — R350 Frequency of micturition: Secondary | ICD-10-CM

## 2023-05-04 MED ORDER — NITROFURANTOIN MONOHYD MACRO 100 MG PO CAPS
100.0000 mg | ORAL_CAPSULE | Freq: Two times a day (BID) | ORAL | 0 refills | Status: AC
Start: 2023-05-04 — End: 2023-05-11

## 2023-05-04 NOTE — Telephone Encounter (Signed)
  Chief Complaint: allegry to Bactrim Symptoms: pt took 1 Bactrim at 1700 and soon after N/V, difficulty breathing, "legs froze up" making it hard to walk, diarrhea, increased voiding Frequency: yesterday  Pertinent Negatives: Patient denies sx now stated by 2100 sx ceased  Disposition: [] ED /[] Urgent Care (no appt availability in office) / [] Appointment(In office/virtual)/ []  [place] [facility]/ [] Home Care/ [x] Refused Recommended Disposition /[] [place] [facility]/ []  Follow-up with PCP Additional Notes: Pt refused appt. Wanted message sent to PCP. Med now entered as allrgy.  Answer Assessment - Initial Assessment Questions 1. NAME of MEDICINE: "What medicine(s) are you calling about?"     Bactrim  2. QUESTION: "What is your question?" (e.g., double dose of medicine, side effect)     Side effect 3. PRESCRIBER: "Who prescribed the medicine?" Reason: if prescribed by specialist, call should be referred to that group.     PCP 4. SYMPTOMS: "Do you have any symptoms?" If Yes, ask: "What symptoms are you having?"  "How bad are the symptoms (e.g., mild, moderate, severe)     H/o vomiting, BM, chills, legs locked up, difficulty  Protocols used: Medication Question Call-A-AH

## 2023-05-04 NOTE — Telephone Encounter (Signed)
Patient informed.  Donna Riley 

## 2023-05-05 LAB — URINE CULTURE

## 2023-08-04 ENCOUNTER — Other Ambulatory Visit: Payer: Self-pay | Admitting: Internal Medicine

## 2023-08-04 DIAGNOSIS — Z1231 Encounter for screening mammogram for malignant neoplasm of breast: Secondary | ICD-10-CM

## 2023-08-09 ENCOUNTER — Ambulatory Visit (INDEPENDENT_AMBULATORY_CARE_PROVIDER_SITE_OTHER): Payer: 59 | Admitting: Urology

## 2023-08-09 VITALS — BP 166/74 | HR 98 | Ht 68.0 in | Wt 210.0 lb

## 2023-08-09 DIAGNOSIS — R3129 Other microscopic hematuria: Secondary | ICD-10-CM | POA: Diagnosis not present

## 2023-08-09 DIAGNOSIS — N2889 Other specified disorders of kidney and ureter: Secondary | ICD-10-CM

## 2023-08-09 LAB — URINALYSIS, COMPLETE
Bilirubin, UA: NEGATIVE
Glucose, UA: NEGATIVE
Ketones, UA: NEGATIVE
Leukocytes,UA: NEGATIVE
Nitrite, UA: NEGATIVE
Protein,UA: NEGATIVE
Specific Gravity, UA: 1.015 (ref 1.005–1.030)
Urobilinogen, Ur: 0.2 mg/dL (ref 0.2–1.0)
pH, UA: 6 (ref 5.0–7.5)

## 2023-08-09 LAB — MICROSCOPIC EXAMINATION

## 2023-08-09 NOTE — Progress Notes (Signed)
 I, Maysun Anabel Bene, acting as a scribe for Riki Altes, MD., have documented all relevant documentation on the behalf of Riki Altes, MD, as directed by Riki Altes, MD while in the presence of Riki Altes, MD.  08/09/2023 12:01 PM   Donna Riley [birth year] [id number]  Referring provider: Reubin Milan, MD [address] [place],  [place] [address]  Chief Complaint  Patient presents with   Other   Urologic history: 1. Left renal mass contrast CT (11/2022) - Enhancing lesion of the lower pole of the left kidney measuring 2.4 cm increased in size when compared with the prior and, highly concerning for renal cell carcinoma cryoablation of the left renal mass (01/13/2023)   2. Microhematuria Cystoscopy 02/08/23 showed no abnormalities.   HPI: Donna Riley is a 63 y.o. female presents for a 6 month follow-up.  Her only complaint today has been slight pelvic heaviness. She denies gross hematuria or bothersome lower urinary tract symptoms.  IR notes indicated a 6 month follow-up CT would be scheduled and she would be seen in follow-up, however this has not been scheduled.  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   PMH: Past Medical History:  Diagnosis Date   Abdominal bloating    Acute midline low back pain without sciatica 10/09/2018   Aortic atherosclerosis (HCC)    Arthritis    Breast cancer, left (HCC) 08/04/2014   a.) Bx (+) for invasive mammary carcinoma with DCIS; ER/PR (+), HER2/neu (-). (+) lymphovascular invasion with 1 of 2 lymph nodes being (+). Treated with lumpectomy + adjuvant chemoradiation.   Breast wound, left, sequela 06/23/2016   Cancer of gastrointestinal tract (HCC) 08/2020   COPD (chronic obstructive pulmonary disease) (HCC)    Coronary artery disease    a.) MV 11/23/2020: no ischemia   Diastolic dysfunction  10/29/2020   a.) TTE 10/29/2020: EF >55%, no RMWAs, norm RVSF, triv AR/TR/PR, mild MR, G1DD   DOE (dyspnea on exertion)    Dyspnea    Elevated LFTs    Endometrial polyp 02/03/2021   a.) Pelvic US --> 18 x 5 x 10 mm lenticular nodule in endrometrial canal; favored endometrial polyp vs. neoplasm. b.) Bx 02/23/2021 --> no dysplasia or malignancy; benign.   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 IHC for cytokeratin AE1/AE3 and CD56 supports.   Osteoporosis of lumbar spine 03/12/2015   2021 - Prolia recommended by Oncology - to start once dental issues are resolved   Polyp of ascending colon    Polyp of descending colon    Polyp of sigmoid colon    Positive PPD, treated    PVD (peripheral vascular disease) with claudication (HCC)    Rectal polyp  T2DM (type 2 diabetes mellitus) (HCC) 06/2014    Surgical History: Past Surgical History:  Procedure Laterality Date   BIOPSY  11/11/2021   Procedure: BIOPSY;  Surgeon: Lemar Lofty., MD;  Location: [facility] ENDOSCOPY;  Service: Gastroenterology;;   BREAST BIOPSY Left 07/31/2014   IMC and DCIS    BREAST BIOPSY Left 03/08/2016   INTRADUCTAL PAPILLOMA    BREAST LUMPECTOMY Left 07/2014   IDC, DCIS, clear margins, positive LN   BREAST LUMPECTOMY Left 04/05/2016   excision of intraductal papilloma, no malignancy or atypia   BREAST LUMPECTOMY WITH NEEDLE LOCALIZATION Left 04/05/2016   Procedure: BREAST LUMPECTOMY  WITH NEEDLE LOCALIZATION;  Surgeon: Gladis Riffle, MD;  Location: [facility] ORS;  Service: General;  Laterality: Left;   BREAST LUMPECTOMY WITH NEEDLE LOCALIZATION AND AXILLARY SENTINEL LYMPH NODE BX Left 08/27/14   CHOLECYSTECTOMY  09/07/2013   COLONOSCOPY WITH PROPOFOL N/A 08/28/2015   Procedure: COLONOSCOPY WITH PROPOFOL;  Surgeon: Midge Minium, MD;  Location: [facility];  Service: Endoscopy;  Laterality: N/A;  Diabetic oral   COLONOSCOPY WITH PROPOFOL N/A 08/14/2020   Procedure: COLONOSCOPY WITH PROPOFOL;  Surgeon: Pasty Spillers, MD;  Location: [facility] ENDOSCOPY;  Service: Endoscopy;  Laterality: N/A;   ENDOSCOPIC MUCOSAL RESECTION N/A 09/10/2020   Procedure: ENDOSCOPIC MUCOSAL RESECTION;  Surgeon: Meridee Score Netty Starring., MD;  Location: [facility] ENDOSCOPY;  Service: Gastroenterology;  Laterality: N/A;   ESOPHAGOGASTRODUODENOSCOPY  2014   gastritis   ESOPHAGOGASTRODUODENOSCOPY (EGD) WITH PROPOFOL N/A 04/16/2019   Procedure: ESOPHAGOGASTRODUODENOSCOPY (EGD) WITH PROPOFOL;  Surgeon: Pasty Spillers, MD;  Location: [facility] ENDOSCOPY;  Service: Endoscopy;  Laterality: N/A;   ESOPHAGOGASTRODUODENOSCOPY (EGD) WITH PROPOFOL N/A 10/03/2019   Procedure: ESOPHAGOGASTRODUODENOSCOPY (EGD) WITH PROPOFOL;  Surgeon: Pasty Spillers, MD;  Location: [facility] ENDOSCOPY;  Service: Endoscopy;  Laterality: N/A;   EUS N/A 09/10/2020   Procedure: LOWER ENDOSCOPIC ULTRASOUND (EUS);  Surgeon: Lemar Lofty., MD;  Location: [facility] ENDOSCOPY;  Service: Gastroenterology;  Laterality: N/A;   EUS N/A 11/11/2021   Procedure: LOWER ENDOSCOPIC ULTRASOUND (EUS);  Surgeon: Lemar Lofty., MD;  Location: [facility] END[facility]COPY;  Service: Gastroenterology;  Laterality: N/A;   FLEXIBLE SIGMOIDOSCOPY N/A 08/27/2020   Procedure: FLEXIBLE SIGMOIDOSCOPY;  Surgeon: Pasty Spillers, MD;  Location: [facility] ENDOSCOPY;  Service: Endoscopy;  Laterality: N/A;   FLEXIBLE SIGMOIDOSCOPY N/A 09/10/2020   Procedure: FLEXIBLE  SIGMOIDOSCOPY;  Surgeon: Meridee Score Netty Starring., MD;  Location: [facility] ENDOSCOPY;  Service: Gastroenterology;  Laterality: N/A;   FLEXIBLE SIGMOIDOSCOPY N/A 11/11/2021   Procedure: FLEXIBLE SIGMOIDOSCOPY;  Surgeon: Meridee Score Netty Starring., MD;  Location: [facility] ENDOSCOPY;  Service: Gastroenterology;  Laterality: N/A;   FOREIGN BODY REMOVAL  09/10/2020   Procedure: FOREIGN BODY REMOVAL ;  Surgeon: Meridee Score Netty Starring., MD;  Location: [facility] ENDOSCOPY;  Service: Gastroenterology;;   HEMOSTASIS CLIP PLACEMENT  09/10/2020   Procedure: HEMOSTASIS CLIP PLACEMENT;  Surgeon: Lemar Lofty., MD;  Location: [facility] ENDOSCOPY;  Service: Gastroenterology;;   HYSTEROSCOPY WITH D & C N/A 03/25/2021   Procedure: DILATATION AND CURETTAGE Melton Krebs;  Surgeon: Vena Austria, MD;  Location: [facility] ORS;  Service: Gynecology;  Laterality: N/A;   IR RADIOLOGIST EVAL & MGMT  01/03/2023   IR RADIOLOGIST EVAL & MGMT  01/31/2023   POLYPECTOMY  08/28/2015   Procedure: POLYPECTOMY INTESTINAL;  Surgeon: Midge Minium, MD;  Location: [facility];  Service: Endoscopy;;  Rectal polyp   SUBMUCOSAL LIFTING INJECTION  09/10/2020   Procedure: SUBMUCOSAL LIFTING INJECTION;  Surgeon: Lemar Lofty., MD;  Location: [facility] ENDOSCOPY;  Service: Gastroenterology;;  TUBAL LIGATION Bilateral    UMBILICAL HERNIA REPAIR N/A 08/09/2016   Procedure: HERNIA REPAIR UMBILICAL ADULT;  Surgeon: Henrene Dodge, MD;  Location: [facility] ORS;  Service: General;  Laterality: N/A;   VENTRAL HERNIA REPAIR N/A 04/10/2015   Procedure: HERNIA REPAIR VENTRAL ADULT;  Surgeon: Nadeen Landau, MD;  Location: [facility] ORS;  Service: General;  Laterality: N/A;    Home Medications:  Allergies as of 08/09/2023       Reactions   Bactrim [sulfamethoxazole-trimethoprim] Shortness Of Breath, Nausea And Vomiting        Medication List        Accurate as of August 09, 2023 12:01 PM. If you have any questions, ask your nurse or doctor.          Accu-Chek  Guide test strip Generic drug: glucose blood TEST TWICE DAILY   acetaminophen 500 MG tablet Commonly known as: TYLENOL Take 1,000 mg by mouth every 6 (six) hours as needed for moderate pain.   albuterol 108 (90 Base) MCG/ACT inhaler Commonly known as: VENTOLIN HFA INHALE 2 PUFFS INTO THE LUNGS EVERY 6 HOURS AS NEEDED FOR WHEEZING OR SHORTNESS OF BREATH   amLODipine 10 MG tablet Commonly known as: NORVASC Take 1 tablet (10 mg total) by mouth daily.   aspirin EC 81 MG tablet Take 81 mg by mouth daily.   atorvastatin 10 MG tablet Commonly known as: LIPITOR TAKE 1 TABLET(10 MG) BY MOUTH AT BEDTIME   irbesartan 300 MG tablet Commonly known as: AVAPRO Take 1 tablet (300 mg total) by mouth daily.   metFORMIN 500 MG 24 hr tablet Commonly known as: GLUCOPHAGE-XR TAKE 1 TABLET BY MOUTH EVERY MORNING WITH BREAKFAST   methocarbamol 500 MG tablet Commonly known as: ROBAXIN Take 500 mg by mouth at bedtime.   multivitamin with minerals tablet Take 1 tablet by mouth daily.   phenazopyridine 95 MG tablet Commonly known as: PYRIDIUM Take 1 tablet (95 mg total) by mouth 3 (three) times daily as needed for pain.   triamterene-hydrochlorothiazide 37.5-25 MG tablet Commonly known as: MAXZIDE-25 Take 1 tablet by mouth daily.   True Metrix Level 1 Low Soln 1 each by In Vitro route daily as needed.   True Metrix Meter Devi 1 each by Does not apply route daily.        Allergies:  Allergies  Allergen Reactions   Bactrim [Sulfamethoxazole-Trimethoprim] Shortness Of Breath and Nausea And Vomiting    Family History: Family History  Problem Relation Age of Onset   Cancer Mother 50       Pancreatic   Cancer Father        Throat dx 37s   Healthy Sister    Cancer Sister 10       unk metastatic   Kidney cancer Sister 52   HIV Brother    Breast cancer Maternal Grandmother 74   Breast cancer Cousin        d. 35s    Social History:  reports that she has been smoking  cigarettes. She has a 22.5 pack-year smoking history. She has never used smokeless tobacco. She reports current alcohol use of about 2.0 standard drinks of alcohol per week. She reports that she does not use drugs.   Physical Exam: BP (!) 166/74   Pulse 98   Ht 5\' 8"  (1.727 m)   Wt 210 lb (95.3 kg)   BMI 31.93 kg/m   Constitutional:  Alert and oriented, No acute distress. HEENT: [place] AT, moist mucus membranes.  Trachea midline, no masses. Cardiovascular: No clubbing, cyanosis, or edema. Respiratory: Normal respiratory effort, no increased work of breathing. GI: Abdomen is soft, nontender, nondistended, no abdominal masses Skin: No rashes, bruises or suspicious lesions. Neurologic: Grossly intact, no focal deficits, moving all 4 extremities. Psychiatric: Normal mood and affect.   Urinalysis Dipstick 3+ blood, microscopy 11-30 RBC    Pertinent Imaging: CT was personally reviewed and interpreted.   CT Renal Stone Study  Narrative CLINICAL DATA:  Flank pain  EXAM: CT ABDOMEN AND PELVIS WITHOUT CONTRAST  TECHNIQUE: Multidetector CT imaging of the abdomen and pelvis was performed following the standard protocol without IV contrast.  RADIATION DOSE REDUCTION: This exam was performed according to the departmental dose-optimization program which includes automated exposure control, adjustment of the mA and/or kV according to patient size and/or use of iterative reconstruction technique.  COMPARISON:  CT 07/08/2022  FINDINGS: Lower chest: No acute abnormality.  Hepatobiliary: Hepatic steatosis. Status post cholecystectomy. No biliary dilatation  Pancreas: Unremarkable. No pancreatic ductal dilatation or surrounding inflammatory changes.  Spleen: Normal in size without focal abnormality.  Adrenals/Urinary Tract: Adrenal glands are unremarkable. Kidneys are normal, without renal calculi, focal lesion, or hydronephrosis. Bladder is unremarkable.  Stomach/Bowel: Stomach  is within normal limits. Appendix appears normal. No evidence of bowel wall thickening, distention, or inflammatory changes.  Vascular/Lymphatic: Mild aortic atherosclerosis. No aneurysm. No suspicious lymph nodes.  Reproductive: Uterus and bilateral adnexa are unremarkable.  Other: No abdominal wall hernia or abnormality. No abdominopelvic ascites.  Musculoskeletal: No acute or significant osseous findings.  IMPRESSION: 1. No CT evidence for acute intra-abdominal or pelvic abnormality. Hepatic steatosis. 2. Aortic atherosclerosis.  Aortic Atherosclerosis (ICD10-I70.0).   Electronically Signed By: Jasmine Pang M.D. On: 07/18/2022 19:18   Assessment & Plan:    1. Microhematuria UA today 11-30 RBC which is stable.  Recent negative cystoscopy.  To be scheduled for a follow-up CT.   2. Left renal mass Status post percutinous cryoablation by IR.  We'll message IR for a follow-up.  [facility] [address], [address] [place], [place] [address] [phone]

## 2023-08-11 ENCOUNTER — Encounter: Payer: Self-pay | Admitting: Urology

## 2023-08-18 ENCOUNTER — Ambulatory Visit
Admission: RE | Admit: 2023-08-18 | Discharge: 2023-08-18 | Disposition: A | Discharge status: Home / Self Care | Source: Ambulatory Visit | Attending: Acute Care | Admitting: Acute Care

## 2023-08-18 DIAGNOSIS — F17210 Nicotine dependence, cigarettes, uncomplicated: Secondary | ICD-10-CM | POA: Insufficient documentation

## 2023-08-18 DIAGNOSIS — Z122 Encounter for screening for malignant neoplasm of respiratory organs: Secondary | ICD-10-CM | POA: Diagnosis not present

## 2023-08-18 DIAGNOSIS — Z87891 Personal history of nicotine dependence: Secondary | ICD-10-CM | POA: Diagnosis not present

## 2023-08-27 ENCOUNTER — Other Ambulatory Visit: Payer: Self-pay | Admitting: Internal Medicine

## 2023-08-27 DIAGNOSIS — I10 Essential (primary) hypertension: Secondary | ICD-10-CM

## 2023-08-27 DIAGNOSIS — E785 Hyperlipidemia, unspecified: Secondary | ICD-10-CM

## 2023-08-27 DIAGNOSIS — E1169 Type 2 diabetes mellitus with other specified complication: Secondary | ICD-10-CM

## 2023-08-28 ENCOUNTER — Other Ambulatory Visit
Admission: RE | Admit: 2023-08-28 | Discharge: 2023-08-28 | Disposition: A | Discharge status: Home / Self Care | Attending: Internal Medicine | Admitting: Internal Medicine

## 2023-08-28 ENCOUNTER — Encounter: Payer: Self-pay | Admitting: Internal Medicine

## 2023-08-28 ENCOUNTER — Ambulatory Visit (INDEPENDENT_AMBULATORY_CARE_PROVIDER_SITE_OTHER): Admitting: Internal Medicine

## 2023-08-28 VITALS — BP 132/70 | HR 103 | Ht 68.0 in | Wt 207.4 lb

## 2023-08-28 DIAGNOSIS — E118 Type 2 diabetes mellitus with unspecified complications: Secondary | ICD-10-CM

## 2023-08-28 DIAGNOSIS — Z7984 Long term (current) use of oral hypoglycemic drugs: Secondary | ICD-10-CM | POA: Diagnosis not present

## 2023-08-28 DIAGNOSIS — Z0000 Encounter for general adult medical examination without abnormal findings: Secondary | ICD-10-CM

## 2023-08-28 DIAGNOSIS — I10 Essential (primary) hypertension: Secondary | ICD-10-CM | POA: Diagnosis not present

## 2023-08-28 DIAGNOSIS — E785 Hyperlipidemia, unspecified: Secondary | ICD-10-CM

## 2023-08-28 DIAGNOSIS — C773 Secondary and unspecified malignant neoplasm of axilla and upper limb lymph nodes: Secondary | ICD-10-CM | POA: Diagnosis not present

## 2023-08-28 DIAGNOSIS — E1169 Type 2 diabetes mellitus with other specified complication: Secondary | ICD-10-CM | POA: Insufficient documentation

## 2023-08-28 DIAGNOSIS — J432 Centrilobular emphysema: Secondary | ICD-10-CM

## 2023-08-28 LAB — CBC WITH DIFFERENTIAL/PLATELET
Abs Immature Granulocytes: 0.03 10*3/uL (ref 0.00–0.07)
Basophils Absolute: 0.1 10*3/uL (ref 0.0–0.1)
Basophils Relative: 1 %
Eosinophils Absolute: 0.2 10*3/uL (ref 0.0–0.5)
Eosinophils Relative: 2 %
HCT: 40 % (ref 36.0–46.0)
Hemoglobin: 13.1 g/dL (ref 12.0–15.0)
Immature Granulocytes: 0 %
Lymphocytes Relative: 35 %
Lymphs Abs: 3.2 10*3/uL (ref 0.7–4.0)
MCH: 27.7 pg (ref 26.0–34.0)
MCHC: 32.8 g/dL (ref 30.0–36.0)
MCV: 84.6 fL (ref 80.0–100.0)
Monocytes Absolute: 0.5 10*3/uL (ref 0.1–1.0)
Monocytes Relative: 5 %
Neutro Abs: 5.2 10*3/uL (ref 1.7–7.7)
Neutrophils Relative %: 57 %
Platelets: 257 10*3/uL (ref 150–400)
RBC: 4.73 MIL/uL (ref 3.87–5.11)
RDW: 13.3 % (ref 11.5–15.5)
WBC: 9.1 10*3/uL (ref 4.0–10.5)
nRBC: 0 % (ref 0.0–0.2)

## 2023-08-28 LAB — COMPREHENSIVE METABOLIC PANEL WITH GFR
ALT: 21 U/L (ref 0–44)
AST: 19 U/L (ref 15–41)
Albumin: 4.3 g/dL (ref 3.5–5.0)
Alkaline Phosphatase: 93 U/L (ref 38–126)
Anion gap: 11 (ref 5–15)
BUN: 19 mg/dL (ref 8–23)
CO2: 27 mmol/L (ref 22–32)
Calcium: 9.7 mg/dL (ref 8.9–10.3)
Chloride: 101 mmol/L (ref 98–111)
Creatinine, Ser: 1 mg/dL (ref 0.44–1.00)
GFR, Estimated: >60 mL/min (ref >60)
Glucose, Bld: 132 mg/dL — ABNORMAL HIGH (ref 70–99)
Potassium: 3.3 mmol/L — ABNORMAL LOW (ref 3.5–5.1)
Sodium: 139 mmol/L (ref 135–145)
Total Bilirubin: 0.6 mg/dL (ref 0.0–1.2)
Total Protein: 8 g/dL (ref 6.5–8.1)

## 2023-08-28 LAB — HEMOGLOBIN A1C
Hgb A1c MFr Bld: 6.4 % — ABNORMAL HIGH (ref 4.8–5.6)
Mean Plasma Glucose: 136.98 mg/dL

## 2023-08-28 LAB — LIPID PANEL
Cholesterol: 103 mg/dL (ref 0–200)
HDL: 32 mg/dL — ABNORMAL LOW (ref >40)
Total CHOL/HDL Ratio: 3.2 ratio
Triglycerides: 379 mg/dL — ABNORMAL HIGH (ref <150)
VLDL: 76 mg/dL — ABNORMAL HIGH (ref 0–40)

## 2023-08-28 LAB — TSH: TSH: 1.801 u[IU]/mL (ref 0.350–4.500)

## 2023-08-28 MED ORDER — ATORVASTATIN CALCIUM 10 MG PO TABS
10.0000 mg | ORAL_TABLET | Freq: Every day | ORAL | 1 refills | Status: DC
Start: 2023-08-28 — End: 2024-01-19

## 2023-08-28 MED ORDER — TRIAMTERENE-HCTZ 37.5-25 MG PO TABS
1.0000 | ORAL_TABLET | Freq: Every day | ORAL | 1 refills | Status: DC
Start: 2023-08-28 — End: 2024-01-08

## 2023-08-28 NOTE — Progress Notes (Signed)
 Date:  08/28/2023   Name:  Donna Riley   DOB:  08-08-1960   MRN:  [id number]   Chief Complaint: Annual Exam and Abdominal Pain (Cramping pain, left, went to urology, had blood in urine) Donna Riley is a 63 y.o. female who presents today for her Complete Annual Exam. She feels well. She reports exercising walks, 3 days out of the week, 15 - 20 minutes. She reports she is sleeping poorly. Breast complaints yes, sharp pain is seeing oncology.  Health Maintenance  Topic Date Due   DTaP/Tdap/Td vaccine (1 - Tdap) Never done   Eye exam for diabetics  01/27/2023   Yearly kidney health urinalysis for diabetes  07/21/2023   Screening for Lung Cancer  08/17/2023   COVID-19 Vaccine (3 - Pfizer risk series) 09/13/2023*   Mammogram  09/06/2023   Hemoglobin A1C  10/22/2023   Medicare Annual Wellness Visit  11/10/2023   Complete foot exam   04/23/2024   Yearly kidney function blood test for diabetes  08/27/2024   Colon Cancer Screening  08/14/2025   Pap with HPV screening  11/07/2027   DEXA scan (bone density measurement)  10/31/2032   Pneumococcal Vaccination  Completed   Flu Shot  Completed   HIV Screening  Completed   Zoster (Shingles) Vaccine  Completed   Hepatitis C Screening  Addressed   HPV Vaccine  Aged Out  *Topic was postponed. The date shown is not the original due date.    Hypertension Pertinent negatives include no chest pain, headaches, palpitations or shortness of breath.  Diabetes Pertinent negatives for hypoglycemia include no dizziness, headaches or tremors. Pertinent negatives for diabetes include no chest pain, no fatigue, no polydipsia, no polyuria and no weakness.  Hyperlipidemia Pertinent negatives include no chest pain, myalgias or shortness of breath.  Breast cancer - s/p lumpectomy and 5 years of AI.  She has residual chronic breast pain which is unchanged.  Oncology follows her closely.  No evidence of recurrence.  Review of Systems  Constitutional:   Negative for appetite change, fatigue, fever and unexpected weight change.  HENT:  Negative for tinnitus and trouble swallowing.   Eyes:  Negative for visual disturbance.  Respiratory:  Negative for cough, chest tightness, shortness of breath and wheezing.   Cardiovascular:  Negative for chest pain, palpitations and leg swelling.  Gastrointestinal:  Negative for abdominal pain, constipation and diarrhea.  Endocrine: Negative for polydipsia and polyuria.  Genitourinary:  Negative for dysuria and hematuria.  Musculoskeletal:  Negative for arthralgias and myalgias.  Neurological:  Negative for dizziness, tremors, weakness, light-headedness, numbness and headaches.  Psychiatric/Behavioral:  Negative for dysphoric mood.      Lab Results  Component Value Date   NA 139 08/28/2023   K 3.3 (L) 08/28/2023   CO2 27 08/28/2023   GLUCOSE 132 (H) 08/28/2023   BUN 19 08/28/2023   CREATININE 1.00 08/28/2023   CALCIUM 9.7 08/28/2023   GFRNONAA >60 08/28/2023   Lab Results  Component Value Date   CHOL 148 07/20/2022   HDL 30 (L) 07/20/2022   LDLCALC 59 07/20/2022   TRIG 293 (H) 07/20/2022   CHOLHDL 4.9 07/20/2022   Lab Results  Component Value Date   TSH 1.765 04/11/2018   Lab Results  Component Value Date   HGBA1C 6.4 (A) 04/24/2023   Lab Results  Component Value Date   WBC 9.1 08/28/2023   HGB 13.1 08/28/2023   HCT 40.0 08/28/2023   MCV 84.6 08/28/2023  PLT 257 08/28/2023   Lab Results  Component Value Date   ALT 21 08/28/2023   AST 19 08/28/2023   ALKPHOS 93 08/28/2023   BILITOT 0.6 08/28/2023   No results found for: "25OHVITD2", "25OHVITD3", "VD25OH"   Patient Active Problem List   Diagnosis Date Noted   History of renal cell cancer 12/19/2022   History of breast cancer 11/07/2022   History of rectal cancer 11/07/2022   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   Lung nodule 03/07/2022   Centrilobular emphysema (HCC) 02/17/2022   Genetic testing  06/10/2021   Endometrial polyp    Benign neuroendocrine tumor of sigmoid colon    History of rectal polyps    Nodule of colon    Sebaceous cyst 08/11/2020   Osteopenia 07/19/2020   Breast pain, right 07/03/2020   Atherosclerosis of abdominal aorta (HCC) 02/06/2020   Tobacco use disorder, continuous 01/14/2020   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   Lumbosacral spondylosis without myelopathy 11/22/2018   Vertigo 10/09/2018   Hyperlipidemia associated with type 2 diabetes mellitus (HCC) 04/11/2018   Gastroparesis 04/11/2018   Long term (current) use of aromatase inhibitors 12/25/2017   Arthritis    Elevated LFTs    Essential hypertension    Positive PPD, treated    Irritable bowel syndrome with both constipation and diarrhea 07/20/2015   Type II diabetes mellitus with complication (HCC) 06/22/2015   Pre-ulcerative calluses 06/22/2015   Neuropathy 06/22/2015   Hot flash, menopausal 04/09/2015   Atherosclerotic peripheral vascular disease with intermittent claudication (HCC) 03/25/2015   Malignant neoplasm of left breast in female, estrogen receptor positive (HCC) 05/30/2014   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    Allergies  Allergen Reactions   Bactrim [Sulfamethoxazole-Trimethoprim] Shortness Of Breath and Nausea And Vomiting    Past Surgical History:  Procedure Laterality Date   BIOPSY  11/11/2021   Procedure: BIOPSY;  Surgeon: Lemar Lofty., MD;  Location: [facility] ENDOSCOPY;  Service: Gastroenterology;;   BREAST BIOPSY Left 07/31/2014   IMC and DCIS    BREAST BIOPSY Left 03/08/2016   INTRADUCTAL PAPILLOMA    BREAST LUMPECTOMY Left 07/2014   IDC, DCIS, clear margins, positive LN   BREAST LUMPECTOMY Left 04/05/2016   excision of intraductal papilloma, no malignancy or atypia   BREAST LUMPECTOMY WITH NEEDLE LOCALIZATION Left 04/05/2016   Procedure: BREAST LUMPECTOMY WITH NEEDLE LOCALIZATION;  Surgeon:  Gladis Riffle, MD;  Location: [facility] ORS;  Service: General;  Laterality: Left;   BREAST LUMPECTOMY WITH NEEDLE LOCALIZATION AND AXILLARY SENTINEL LYMPH NODE BX Left 08/27/14   CHOLECYSTECTOMY  09/07/2013   COLONOSCOPY WITH PROPOFOL N/A 08/28/2015   Procedure: COLONOSCOPY WITH PROPOFOL;  Surgeon: Midge Minium, MD;  Location: [facility];  Service: Endoscopy;  Laterality: N/A;  Diabetic oral   COLONOSCOPY WITH PROPOFOL N/A 08/14/2020   Procedure: COLONOSCOPY WITH PROPOFOL;  Surgeon: Pasty Spillers, MD;  Location: [facility] ENDOSCOPY;  Service: Endoscopy;  Laterality: N/A;   ENDOSCOPIC MUCOSAL RESECTION N/A 09/10/2020   Procedure: ENDOSCOPIC MUCOSAL RESECTION;  Surgeon: Meridee Score Netty Starring., MD;  Location: [facility] ENDOSCOPY;  Service: Gastroenterology;  Laterality: N/A;   ESOPHAGOGASTRODUODENOSCOPY  2014   gastritis   ESOPHAGOGASTRODUODENOSCOPY (EGD) WITH PROPOFOL N/A 04/16/2019   Procedure: ESOPHAGOGASTRODUODENOSCOPY (EGD) WITH PROPOFOL;  Surgeon: Pasty Spillers, MD;  Location: [facility] ENDOSCOPY;  Service: Endoscopy;  Laterality: N/A;   ESOPHAGOGASTRODUODENOSCOPY (EGD) WITH PROPOFOL N/A 10/03/2019   Procedure: ESOPHAGOGASTRODUODENOSCOPY (EGD) WITH PROPOFOL;  Surgeon: Maximino Greenland,  Dolphus Jenny, MD;  Location: [facility] ENDOSCOPY;  Service: Endoscopy;  Laterality: N/A;   EUS N/A 09/10/2020   Procedure: LOWER ENDOSCOPIC ULTRASOUND (EUS);  Surgeon: Lemar Lofty., MD;  Location: [facility] ENDOSCOPY;  Service: Gastroenterology;  Laterality: N/A;   EUS N/A 11/11/2021   Procedure: LOWER ENDOSCOPIC ULTRASOUND (EUS);  Surgeon: Lemar Lofty., MD;  Location: [facility] ENDOSCOPY;  Service: Gastroenterology;  Laterality: N/A;   FLEXIBLE SIGMOIDOSCOPY N/A 08/27/2020   Procedure: FLEXIBLE SIGMOIDOSCOPY;  Surgeon: Pasty Spillers, MD;  Location: [facility] ENDOSCOPY;  Service: Endoscopy;  Laterality: N/A;   FLEXIBLE SIGMOIDOSCOPY N/A 09/10/2020   Procedure: FLEXIBLE SIGMOIDOSCOPY;  Surgeon: Meridee Score Netty Starring.,  MD;  Location: [facility] ENDOSCOPY;  Service: Gastroenterology;  Laterality: N/A;   FLEXIBLE SIGMOIDOSCOPY N/A 11/11/2021   Procedure: FLEXIBLE SIGMOIDOSCOPY;  Surgeon: Meridee Score Netty Starring., MD;  Location[facility] ENDOSCOPY;  Service: Gastroenterology;  Laterality: N/A;   FOREIGN BODY REMOVAL  09/10/2020   Procedure: FOREIGN BODY REMOVAL ;  Surgeon: Meridee Score Netty Starring., MD;  Location: [facility] ENDOSCOPY;  Service: Gastroenterology;;   HEMOSTASIS CLIP PLACEMENT  09/10/2020   Procedure: HEMOSTASIS CLIP PLACEMENT;  Surgeon: Lemar Lofty., MD;  Location: [facility] ENDOSCOPY;  Service: Gastroenterology;;   HYSTEROSCOPY WITH D & C N/A 03/25/2021   Procedure: DILATATION AND CURETTAGE Melton Krebs;  Surgeon: Vena Austria, MD;  Location: [facility] ORS;  Service: Gynecology;  Laterality: N/A;   IR RADIOLOGIST EVAL & MGMT  01/03/2023   IR RADIOLOGIST EVAL & MGMT  01/31/2023   POLYPECTOMY  08/28/2015   Procedure: POLYPECTOMY INTESTINAL;  Surgeon: Midge Minium, MD;  Location: [facility];  Service: Endoscopy;;  Rectal polyp   SUBMUCOSAL LIFTING INJECTION  09/10/2020   Procedure: SUBMUCOSAL LIFTING INJECTION;  Surgeon: Lemar Lofty., MD;  Location: [facility] ENDOSCOPY;  Service: Gastroenterology;;   TUBAL LIGATION Bilateral    UMBILICAL HERNIA REPAIR N/A 08/09/2016   Procedure: HERNIA REPAIR UMBILICAL ADULT;  Surgeon: Henrene Dodge, MD;  Location: [facility] ORS;  Service: General;  Laterality: N/A;   VENTRAL HERNIA REPAIR N/A 04/10/2015   Procedure: HERNIA REPAIR VENTRAL ADULT;  Surgeon: Nadeen Landau, MD;  Location: [facility] ORS;  Service: General;  Laterality: N/A;    Social History   Tobacco Use   Smoking status: Every Day    Current packs/day: 0.50    Average packs/day: 0.5 packs/day for 45.0 years (22.5 ttl pk-yrs)    Types: Cigarettes   Smokeless tobacco: Never   Tobacco comments:    Smokes 7 ciggs daily.  Vaping Use   Vaping status: Never Used  Substance Use Topics   Alcohol use: Yes     Alcohol/week: 2.0 standard drinks of alcohol    Types: 2 Cans of beer per week    Comment: weekends - beer    Drug use: No     Medication list has been reviewed and updated.  Current Meds  Medication Sig   acetaminophen (TYLENOL) 500 MG tablet Take 1,000 mg by mouth every 6 (six) hours as needed for moderate pain.   albuterol (VENTOLIN HFA) 108 (90 Base) MCG/ACT inhaler INHALE 2 PUFFS INTO THE LUNGS EVERY 6 HOURS AS NEEDED FOR WHEEZING OR SHORTNESS OF BREATH   amLODipine (NORVASC) 10 MG tablet Take 1 tablet (10 mg total) by mouth daily.   aspirin EC 81 MG tablet Take 81 mg by mouth daily.   Blood Glucose Calibration (TRUE METRIX LEVEL 1) Low SOLN 1 each by In Vitro route daily as needed.   Blood Glucose Monitoring Suppl (TRUE METRIX METER) DEVI  1 each by Does not apply route daily.   glucose blood (ACCU-CHEK GUIDE) test strip TEST TWICE DAILY   irbesartan (AVAPRO) 300 MG tablet Take 1 tablet (300 mg total) by mouth daily.   metFORMIN (GLUCOPHAGE-XR) 500 MG 24 hr tablet TAKE 1 TABLET BY MOUTH EVERY MORNING WITH BREAKFAST   methocarbamol (ROBAXIN) 500 MG tablet Take 500 mg by mouth at bedtime.   Multiple Vitamins-Minerals (MULTIVITAMIN WITH MINERALS) tablet Take 1 tablet by mouth daily.   [DISCONTINUED] atorvastatin (LIPITOR) 10 MG tablet TAKE 1 TABLET(10 MG) BY MOUTH AT BEDTIME   [DISCONTINUED] triamterene-hydrochlorothiazide (MAXZIDE-25) 37.5-25 MG tablet Take 1 tablet by mouth daily.       08/28/2023   10:48 AM 05/03/2023    2:08 PM 04/24/2023   11:07 AM 12/19/2022   11:00 AM  GAD 7 : Generalized Anxiety Score  Nervous, Anxious, on Edge 0 0 0 1  Control/stop worrying 0 0 0 0  Worry too much - different things 0 0 0 1  Trouble relaxing 0 0 0 0  Restless 0 0 0 0  Easily annoyed or irritable 0 0 0 0  Afraid - awful might happen 0 0 0 1  Total GAD 7 Score 0 0 0 3  Anxiety Difficulty Not difficult at all Not difficult at all Not difficult at all Not difficult at all        08/28/2023   10:48 AM 05/03/2023    2:08 PM 04/24/2023   11:06 AM  Depression screen PHQ 2/9  Decreased Interest 0 0 0  Down, Depressed, Hopeless 0 0 0  PHQ - 2 Score 0 0 0  Altered sleeping 2 2 1   Tired, decreased energy 0 0 0  Change in appetite 0 0 0  Feeling bad or failure about yourself  0 0 0  Trouble concentrating 0 0 0  Moving slowly or fidgety/restless 0 0 0  Suicidal thoughts 0 0 0  PHQ-9 Score 2 2 1   Difficult doing work/chores Not difficult at all Not difficult at all Not difficult at all    BP Readings from Last 3 Encounters:  08/28/23 132/70  08/09/23 (!) 166/74  05/03/23 (!) 140/78    Physical Exam Vitals and nursing note reviewed.  Constitutional:      General: She is not in acute distress.    Appearance: She is well-developed.  HENT:     Head: Normocephalic and atraumatic.     Right Ear: Tympanic membrane and ear canal normal.     Left Ear: Tympanic membrane and ear canal normal.     Nose:     Right Sinus: No maxillary sinus tenderness.     Left Sinus: No maxillary sinus tenderness.  Eyes:     General: No scleral icterus.       Right eye: No discharge.        Left eye: No discharge.     Conjunctiva/sclera: Conjunctivae normal.  Neck:     Thyroid: No thyromegaly.     Vascular: No carotid bruit.  Cardiovascular:     Rate and Rhythm: Normal rate and regular rhythm.     Pulses: Normal pulses.     Heart sounds: Normal heart sounds.  Pulmonary:     Effort: Pulmonary effort is normal. No respiratory distress.     Breath sounds: No wheezing.  Chest:     Comments: Breast exam deferred to Oncology Abdominal:     General: Bowel sounds are normal.     Palpations: Abdomen is  soft.     Tenderness: There is no abdominal tenderness.  Musculoskeletal:     Cervical back: Normal range of motion. No erythema.     Right lower leg: No edema.     Left lower leg: No edema.  Lymphadenopathy:     Cervical: No cervical adenopathy.  Skin:    General: Skin is warm  and dry.     Findings: No rash.  Neurological:     Mental Status: She is alert and oriented to person, place, and time.     Cranial Nerves: No cranial nerve deficit.     Sensory: No sensory deficit.     Deep Tendon Reflexes: Reflexes are normal and symmetric.  Psychiatric:        Attention and Perception: Attention normal.        Mood and Affect: Mood normal.     Wt Readings from Last 3 Encounters:  08/28/23 207 lb 6 oz (94.1 kg)  08/09/23 210 lb (95.3 kg)  05/03/23 210 lb (95.3 kg)    BP 132/70   Pulse (!) 103   Ht 5\' 8"  (1.727 m)   Wt 207 lb 6 oz (94.1 kg)   SpO2 98%   BMI 31.53 kg/m   Assessment and Plan:  Problem List Items Addressed This Visit       Unprioritized   Type II diabetes mellitus with complication (HCC) (Chronic)   Blood sugars have been stable.  No recent hypoglycemic events requiring assistance. Currently medications are MTF. Lab Results  Component Value Date   HGBA1C 6.4 (A) 04/24/2023   Last visit no changes were made.       Relevant Medications   atorvastatin (LIPITOR) 10 MG tablet   Other Relevant Orders   Comprehensive metabolic panel with GFR (Completed)   Hemoglobin A1c   Microalbumin / creatinine urine ratio   Essential hypertension (Chronic)   Blood pressure is well controlled.  Current medications hydrochlorothiazide, amlodipine and irbesartan. Will continue same regimen along with efforts to limit dietary sodium.       Relevant Medications   triamterene-hydrochlorothiazide (MAXZIDE-25) 37.5-25 MG tablet   atorvastatin (LIPITOR) 10 MG tablet   Other Relevant Orders   CBC with Differential/Platelet (Completed)   TSH   Hyperlipidemia associated with type 2 diabetes mellitus (HCC) (Chronic)   LDL is  Lab Results  Component Value Date   LDLCALC 59 07/20/2022   Current regimen is atorvastatin.  No medication side effects noted. Goal LDL is <70.       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      Centrilobular emphysema (HCC) (Chronic)   Recent annual CT was done - reading/result still pending Symptoms controlled with albuterol PRN      Other Visit Diagnoses       Annual physical exam    -  Primary   up to date on screenings and immunizations continue work on healthy diet and exercise     Long term current use of oral hypoglycemic drug           Return in about 4 months (around 12/28/2023) for DM.    Reubin Milan, MD [facility] [place]

## 2023-08-28 NOTE — Assessment & Plan Note (Signed)
 Recent annual CT was done - reading/result still pending Symptoms controlled with albuterol PRN

## 2023-08-28 NOTE — Assessment & Plan Note (Signed)
 Followed by Oncology Completed 5 yrs of AI Annual mammogram scheduled.

## 2023-08-28 NOTE — Assessment & Plan Note (Signed)
 Blood pressure is well controlled.  Current medications hydrochlorothiazide, amlodipine and irbesartan. Will continue same regimen along with efforts to limit dietary sodium.

## 2023-08-28 NOTE — Assessment & Plan Note (Signed)
 LDL is  Lab Results  Component Value Date   LDLCALC 59 07/20/2022   Current regimen is atorvastatin.  No medication side effects noted. Goal LDL is <70.

## 2023-08-28 NOTE — Assessment & Plan Note (Signed)
 Blood sugars have been stable.  No recent hypoglycemic events requiring assistance. Currently medications are MTF. Lab Results  Component Value Date   HGBA1C 6.4 (A) 04/24/2023   Last visit no changes were made.

## 2023-08-29 LAB — MICROALBUMIN / CREATININE URINE RATIO
Creatinine, Urine: 157.5 mg/dL
Microalb Creat Ratio: 36 mg/g{creat} — ABNORMAL HIGH (ref 0–29)
Microalb, Ur: 56 ug/mL — ABNORMAL HIGH

## 2023-08-29 NOTE — Telephone Encounter (Signed)
 Requested Prescriptions  Refused Prescriptions Disp Refills   amLODipine (NORVASC) 10 MG tablet [Pharmacy Med Name: AMLODIPINE BESYLATE 10MG  TABLETS] 90 tablet 1    Sig: TAKE 1 TABLET(10 MG) BY MOUTH DAILY     Cardiovascular: Calcium Channel Blockers 2 Passed - 08/29/2023  2:47 PM      Passed - Last BP in normal range    BP Readings from Last 1 Encounters:  08/28/23 132/70         Passed - Last Heart Rate in normal range    Pulse Readings from Last 1 Encounters:  08/28/23 (!) 103         Passed - Valid encounter within last 6 months    Recent Outpatient Visits           Yesterday Annual physical exam   [place], Nyoka Cowden, MD       Future Appointments             In 5 months McGowan, [place], PA-C [facility] [place]             atorvastatin (LIPITOR) 10 MG tablet [Pharmacy Med Name: ATORVASTATIN 10MG  TABLETS] 90 tablet 1    Sig: TAKE 1 TABLET(10 MG) BY MOUTH AT BEDTIME     Cardiovascular:  Antilipid - Statins Failed - 08/29/2023  2:47 PM      Failed - Lipid Panel in normal range within the last 12 months    Cholesterol  Date Value Ref Range Status  08/28/2023 103 0 - 200 mg/dL Final   LDL Cholesterol  Date Value Ref Range Status  08/28/2023 NOT CALCULATED 0 - 99 mg/dL Final    Comment:           Total Cholesterol/HDL:CHD Risk Coronary Heart Disease Risk Table                     Men   Women  1/2 Average Risk   3.4   3.3  Average Risk       5.0   4.4  2 X Average Risk   9.6   7.1  3 X Average Risk  23.4   11.0        Use the calculated Patient Ratio above and the CHD Risk Table to determine the patient's CHD Risk.        ATP III CLASSIFICATION (LDL):  <100     mg/dL   Optimal  295-621  mg/dL   Near or Above                    Optimal  130-159  mg/dL   Borderline  308-657  mg/dL   High  >846     mg/dL   Very High Performed at [facility], [address]  [place], [place] [address]    HDL  Date Value Ref Range Status  08/28/2023 32 (L) >40 mg/dL Final   Triglycerides  Date Value Ref Range Status  08/28/2023 379 (H) <150 mg/dL Final         Passed - Patient is not pregnant      Passed - Valid encounter within last 12 months    Recent Outpatient Visits           Yesterday Annual physical exam   [facility] at [facility], Nyoka Cowden, MD       Future Appointments  In 5 months McGowan, Elana Alm [facility] [place]

## 2023-08-30 ENCOUNTER — Encounter: Payer: Self-pay | Admitting: Internal Medicine

## 2023-08-31 ENCOUNTER — Encounter: Payer: Self-pay | Admitting: Internal Medicine

## 2023-09-07 ENCOUNTER — Ambulatory Visit
Admission: RE | Admit: 2023-09-07 | Discharge: 2023-09-07 | Disposition: A | Discharge status: Home / Self Care | Source: Ambulatory Visit | Attending: Internal Medicine | Admitting: Internal Medicine

## 2023-09-07 ENCOUNTER — Other Ambulatory Visit: Payer: Self-pay | Admitting: Oncology

## 2023-09-07 DIAGNOSIS — N6320 Unspecified lump in the left breast, unspecified quadrant: Secondary | ICD-10-CM

## 2023-09-07 DIAGNOSIS — Z1231 Encounter for screening mammogram for malignant neoplasm of breast: Secondary | ICD-10-CM | POA: Insufficient documentation

## 2023-09-07 DIAGNOSIS — Z853 Personal history of malignant neoplasm of breast: Secondary | ICD-10-CM

## 2023-09-12 ENCOUNTER — Ambulatory Visit: Payer: Self-pay

## 2023-09-12 NOTE — Telephone Encounter (Signed)
 Called pt with results from most recent las per Dr. Gala Jubilee. Pt verbalized understanding.  KP

## 2023-09-12 NOTE — Telephone Encounter (Signed)
 2nd attempt to return her call.   Does not have voicemail set up.   Will route to call back basket for further attempts.

## 2023-09-12 NOTE — Telephone Encounter (Signed)
 3rd call attempt, unable to reach patient. Mailbox is not set up, unable to leave message. Routing to practice for no contact X 3 follow up. Reason for Disposition . Third attempt to contact caller AND no contact made. Phone number verified.  Protocols used: No Contact or Duplicate Contact Call-A-AH

## 2023-09-12 NOTE — Telephone Encounter (Signed)
 Attempted to contact patient, vm that is not set up yet.   Copied from CRM [id number]. Topic: Clinical - Lab/Test Results >> Sep 12, 2023 12:00 PM Donna Riley wrote: Reason for CRM: Pt is calling for abnormal lab results.

## 2023-09-13 ENCOUNTER — Ambulatory Visit
Admission: RE | Admit: 2023-09-13 | Discharge: 2023-09-13 | Disposition: A | Discharge status: Home / Self Care | Source: Ambulatory Visit | Attending: Oncology | Admitting: Oncology

## 2023-09-13 ENCOUNTER — Inpatient Hospital Stay: Payer: 59 | Attending: Oncology

## 2023-09-13 ENCOUNTER — Ambulatory Visit
Admission: RE | Admit: 2023-09-13 | Discharge: 2023-09-13 | Disposition: A | Discharge status: Home / Self Care | Payer: 59 | Source: Ambulatory Visit | Attending: Oncology | Admitting: Oncology

## 2023-09-13 DIAGNOSIS — C50912 Malignant neoplasm of unspecified site of left female breast: Secondary | ICD-10-CM | POA: Insufficient documentation

## 2023-09-13 DIAGNOSIS — R92323 Mammographic fibroglandular density, bilateral breasts: Secondary | ICD-10-CM | POA: Diagnosis not present

## 2023-09-13 DIAGNOSIS — C7A8 Other malignant neuroendocrine tumors: Secondary | ICD-10-CM | POA: Diagnosis not present

## 2023-09-13 DIAGNOSIS — F17210 Nicotine dependence, cigarettes, uncomplicated: Secondary | ICD-10-CM | POA: Diagnosis not present

## 2023-09-13 DIAGNOSIS — N6324 Unspecified lump in the left breast, lower inner quadrant: Secondary | ICD-10-CM | POA: Diagnosis not present

## 2023-09-13 DIAGNOSIS — Z9049 Acquired absence of other specified parts of digestive tract: Secondary | ICD-10-CM | POA: Diagnosis not present

## 2023-09-13 DIAGNOSIS — R921 Mammographic calcification found on diagnostic imaging of breast: Secondary | ICD-10-CM | POA: Diagnosis not present

## 2023-09-13 DIAGNOSIS — Z170 Estrogen receptor positive status [ER+]: Secondary | ICD-10-CM | POA: Insufficient documentation

## 2023-09-13 DIAGNOSIS — C20 Malignant neoplasm of rectum: Secondary | ICD-10-CM | POA: Insufficient documentation

## 2023-09-13 DIAGNOSIS — N289 Disorder of kidney and ureter, unspecified: Secondary | ICD-10-CM | POA: Diagnosis not present

## 2023-09-13 DIAGNOSIS — N6320 Unspecified lump in the left breast, unspecified quadrant: Secondary | ICD-10-CM | POA: Insufficient documentation

## 2023-09-13 DIAGNOSIS — Z79899 Other long term (current) drug therapy: Secondary | ICD-10-CM | POA: Insufficient documentation

## 2023-09-13 DIAGNOSIS — Z9889 Other specified postprocedural states: Secondary | ICD-10-CM | POA: Insufficient documentation

## 2023-09-13 LAB — CBC WITH DIFFERENTIAL (CANCER CENTER ONLY)
Abs Immature Granulocytes: 0.03 10*3/uL (ref 0.00–0.07)
Basophils Absolute: 0.1 10*3/uL (ref 0.0–0.1)
Basophils Relative: 1 %
Eosinophils Absolute: 0.1 10*3/uL (ref 0.0–0.5)
Eosinophils Relative: 1 %
HCT: 41.9 % (ref 36.0–46.0)
Hemoglobin: 13.6 g/dL (ref 12.0–15.0)
Immature Granulocytes: 0 %
Lymphocytes Relative: 34 %
Lymphs Abs: 2.8 10*3/uL (ref 0.7–4.0)
MCH: 27.6 pg (ref 26.0–34.0)
MCHC: 32.5 g/dL (ref 30.0–36.0)
MCV: 85 fL (ref 80.0–100.0)
Monocytes Absolute: 0.6 10*3/uL (ref 0.1–1.0)
Monocytes Relative: 7 %
Neutro Abs: 4.8 10*3/uL (ref 1.7–7.7)
Neutrophils Relative %: 57 %
Platelet Count: 246 10*3/uL (ref 150–400)
RBC: 4.93 MIL/uL (ref 3.87–5.11)
RDW: 13.2 % (ref 11.5–15.5)
WBC Count: 8.4 10*3/uL (ref 4.0–10.5)
nRBC: 0 % (ref 0.0–0.2)

## 2023-09-13 LAB — CMP (CANCER CENTER ONLY)
ALT: 20 U/L (ref 0–44)
AST: 20 U/L (ref 15–41)
Albumin: 4.2 g/dL (ref 3.5–5.0)
Alkaline Phosphatase: 89 U/L (ref 38–126)
Anion gap: 12 (ref 5–15)
BUN: 22 mg/dL (ref 8–23)
CO2: 20 mmol/L — ABNORMAL LOW (ref 22–32)
Calcium: 9.1 mg/dL (ref 8.9–10.3)
Chloride: 102 mmol/L (ref 98–111)
Creatinine: 1.2 mg/dL — ABNORMAL HIGH (ref 0.44–1.00)
GFR, Estimated: 51 mL/min — ABNORMAL LOW (ref >60)
Glucose, Bld: 133 mg/dL — ABNORMAL HIGH (ref 70–99)
Potassium: 3.4 mmol/L — ABNORMAL LOW (ref 3.5–5.1)
Sodium: 134 mmol/L — ABNORMAL LOW (ref 135–145)
Total Bilirubin: 0.8 mg/dL (ref 0.0–1.2)
Total Protein: 7.8 g/dL (ref 6.5–8.1)

## 2023-09-13 MED ORDER — IOHEXOL 300 MG/ML  SOLN
100.0000 mL | Freq: Once | INTRAMUSCULAR | Status: AC | PRN
  Administered 2023-09-13: 100 mL via INTRAVENOUS

## 2023-09-14 DIAGNOSIS — I10 Essential (primary) hypertension: Secondary | ICD-10-CM | POA: Diagnosis not present

## 2023-09-14 DIAGNOSIS — I25118 Atherosclerotic heart disease of native coronary artery with other forms of angina pectoris: Secondary | ICD-10-CM | POA: Diagnosis not present

## 2023-09-14 DIAGNOSIS — E782 Mixed hyperlipidemia: Secondary | ICD-10-CM | POA: Diagnosis not present

## 2023-09-14 LAB — CHROMOGRANIN A: Chromogranin A (ng/mL): 54.3 ng/mL (ref 0.0–101.8)

## 2023-09-14 LAB — CANCER ANTIGEN 27.29: CA 27.29: 15.3 U/mL (ref 0.0–38.6)

## 2023-09-20 ENCOUNTER — Encounter: Payer: Self-pay | Admitting: Oncology

## 2023-09-20 ENCOUNTER — Inpatient Hospital Stay (HOSPITAL_BASED_OUTPATIENT_CLINIC_OR_DEPARTMENT_OTHER): Payer: 59 | Admitting: Oncology

## 2023-09-20 ENCOUNTER — Other Ambulatory Visit: Payer: Self-pay

## 2023-09-20 VITALS — BP 117/53 | HR 89 | Temp 98.9°F | Resp 18 | Wt 208.3 lb

## 2023-09-20 DIAGNOSIS — Z79899 Other long term (current) drug therapy: Secondary | ICD-10-CM | POA: Diagnosis not present

## 2023-09-20 DIAGNOSIS — R911 Solitary pulmonary nodule: Secondary | ICD-10-CM | POA: Diagnosis not present

## 2023-09-20 DIAGNOSIS — M8588 Other specified disorders of bone density and structure, other site: Secondary | ICD-10-CM | POA: Diagnosis not present

## 2023-09-20 DIAGNOSIS — F17210 Nicotine dependence, cigarettes, uncomplicated: Secondary | ICD-10-CM | POA: Diagnosis not present

## 2023-09-20 DIAGNOSIS — C20 Malignant neoplasm of rectum: Secondary | ICD-10-CM | POA: Diagnosis not present

## 2023-09-20 DIAGNOSIS — C50912 Malignant neoplasm of unspecified site of left female breast: Secondary | ICD-10-CM | POA: Diagnosis not present

## 2023-09-20 DIAGNOSIS — Z170 Estrogen receptor positive status [ER+]: Secondary | ICD-10-CM | POA: Diagnosis not present

## 2023-09-20 DIAGNOSIS — C7A8 Other malignant neuroendocrine tumors: Secondary | ICD-10-CM | POA: Diagnosis not present

## 2023-09-20 DIAGNOSIS — Z122 Encounter for screening for malignant neoplasm of respiratory organs: Secondary | ICD-10-CM

## 2023-09-20 DIAGNOSIS — Z87891 Personal history of nicotine dependence: Secondary | ICD-10-CM

## 2023-09-20 NOTE — Assessment & Plan Note (Signed)
 DEXA in 2024 showed osteopenia, T score -2.3, Continue calcium  and vitamin D.

## 2023-09-20 NOTE — Assessment & Plan Note (Addendum)
 History of Stage IIA LEFT breast cancer Finished 5 years of endocrine therapy on 03/13/2020 [facility] testing on 08/20/2019 revealed no benefit from extended endocrine therapy Continue annual mammogram-April 2026

## 2023-09-20 NOTE — Assessment & Plan Note (Addendum)
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 No T stage on pathology Status post resection.  Negative though close margin 0. 1 mm. CT scan showed no recurrence/metastatic disease  Continue annual CT scan surveillance

## 2023-09-20 NOTE — Progress Notes (Signed)
 Hematology Oncology Progress Note  Chief Complaint: Donna Riley is a 63 y.o.  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 No T stage on pathology Status post resection.  Negative though close margin 0. 1 mm. CT scan showed no recurrence/metastatic disease  Continue annual CT scan surveillance  Lung nodule Lung-RADS 2 on  CT chest lung cancer screening.  Continue follow up with lung caner screening program.    Malignant neoplasm of left breast in female, estrogen receptor positive (HCC) History of Stage IIA LEFT breast cancer Finished 5 years of endocrine therapy on 03/13/2020 BCI testing on 08/20/2019 revealed no benefit from extended endocrine therapy Continue annual mammogram-April 2026   Osteopenia DEXA in 2024 showed osteopenia, T score -2.3, Continue calcium  and vitamin D.   Orders Placed This Encounter  Procedures   CT Abdomen Pelvis W Contrast    Standing Status:   Future    Expected Date:   09/19/2024    Expiration Date:   09/19/2024    Scheduling Instructions:     To be scheduled in April 2024 ( a few days prior to seeing Dr. Wilhelmenia Harada).    If indicated for the ordered procedure, I authorize the administration of contrast media per Radiology protocol:   Yes    Does the patient have a contrast media/X-ray dye allergy?:   Yes    Preferred imaging location?:    [facility]    If indicated for the ordered procedure, I authorize the administration of oral contrast media per Radiology protocol:   Yes   CBC with Differential (Cancer Center Only)    Standing Status:   Future    Expected Date:   09/19/2024    Expiration Date:   09/19/2024   CMP (Cancer Center only)    Standing Status:   Future    Expected Date:   09/19/2024    Expiration Date:   09/19/2024   Chromogranin A    Standing  Status:   Future    Expected Date:   09/19/2024    Expiration Date:   09/19/2024   Follow up 12 months.  All questions were answered. The patient knows to call the clinic with any problems, questions or concerns.  Timmy Forbes, MD, PhD [facility] Hematology Oncology 09/20/2023    PERTINENT ONCOLOGY HISTORY Patient previously followed up by Dr.Corcoran, patient switched care to me on 12/31/20 Extensive medical record review was performed by me  #stage IIA left breast cancer  08/27/2014 partial mastectomy and sentinel lymph node biopsy on 08/27/2014.  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  There was lymphovascular invasion.  One of 2 sentinel lymph nodes were positive  (focus of 2.8 mm).  Tumor was > 90% ER positive, > 90% PR positive, and Her2/neu negative.  Pathologic stage was [id number].   Bone scan on 09/16/2014 revealed abnormal focal uptake at the level of right L3 pedicle and L5 vertebral body.  Lumbar spine MRI on 09/27/2014 revealed no evidence of metastatic disease with lower lumbar facet arthritis.  Abdominal and pelvic CT scan on 09/24/2014 revealed hepatomegaly and no evidence of metastatic disease.     Adjuvant chemotherapy  4 cycles of Taxotere  and Cytoxan  (09/29/2014 - 12/09/2014) with Neulasta  support. She received 50.4 Gy to the left breast from  01/12/2015 until 02/23/2015.    03/12/2015, started on letrozole  but switched to Arimidex  on 03/30/2015 secondary to diffuse joint aches.   Arimidex  completed around 03/13/2020.  BCI testing on 08/20/2019 revealed an estimated risk of late recurrence between years 5-10 of 10.1% (95% CI: 5.6-14.3%).  She is not likely to benefit from extended endocrine therapy.   She had chronic left nipple discharge.  Left breast lumpectomy at the 11 o'clock position on 04/05/2016 revealed an intraductal papilloma with sclerosis and calcifications. There was fat necrosis with  fibrosis and calcifications. Pathology was negative for atypia and malignancy  Health maintenance Colonoscopy on 08/28/2015 revealed one 4 mm polyp in the rectum (tubular adenoma). EGD on 10/03/2019 revealed erythematous mucosa in the antrum. Pathology revealed antral and oxyntic mucosa with moderate chronic inactive gastritis. H. Pylori was negative. There was no intestinal metaplasia, dysplasia, and malignancy.   #Osteoporosis Bone density study on 09/15/2014 revealed osteopenia with a T-score of -2.1 at L1-L4.  Bone density on 01/17/2018 revealed osteoporosis with a T-score of -2.6 in the AP spine L1-L4.  Bone density on 03/03/2020 revealed osteopenia with a T score of -2.2 at the lumbar spine. She is on calcium  and vitamin D.   #Lung cancer screening  She has a smoking history.  Low dose chest CT on 02/05/2020 revealed Lung-RADS 2S, benign appearance or behavior.  There was aortic atherosclerosis, in addition to left anterior descending coronary artery disease. There was mild diffuse bronchial wall thickening with mild centrilobular and paraseptal emphysema suggestive of underlying COPD.  08/09/2020 colonoscopy by Dr. Tully Gainer showed 5 polyp in ascending colon, descending colon and sigmoid colon resected and retrieved-pathology showed tubular adenoma, multiple fragments.  Negative for high-grade dysplasia and malignancy. Nodule in the sigmoid colon, biopsied.  Clip was placed.-Biopsy showed well-differentiated neuroendocrine tumor.  Grade 1.  08/27/2020, flexible sigmoidoscopy showed mucosal nodule in the sigmoid colon, tattooed.  Nonbleeding internal hemorrhoids.    09/10/2020  Flex Sigmoidoscopy/Lower EUS by Dr. Brice Campi, and EMR  Pathology showed proximal rectum EMR Well differentiated neuroendocrine tumor-carcinoid.  Neuroendocrine tumor less 10.1 cm from cauterized margin. 1.2 x 1 x 0.8 cm.  Dr. Brice Campi felt to be a complete resection and recommend 1 year follow-up lower  EUS. 09/30/2020 CT abdomen pelvis showed sutures near the rectosigmoid junction consistent with history.  Hepatic steatosis.  09/30/2020 Chromogranin A level at 49.6.  07/02/21 mammogram No mammographic evidence of malignancy. 11/11/2021, patient had repeat EUS/sigmoidoscopy for follow-up of the rectal NET.  Per patient, no abnormality was found.  EUS report was not available in EMR.  INTERVAL HISTORY Donna Riley is a 63 y.o. female who has above history reviewed by me today presents for follow up for history of breast cancer as well as rectal neuroendocrine carcinoma.  No new complaints or new breast concerns.  Denies cough, shortness of breath, abdominal pain, blood in the stool.      Past Medical History:  Diagnosis Date   Abdominal bloating    Acute midline low back pain without sciatica 10/09/2018   Aortic atherosclerosis (HCC)    Arthritis    Breast cancer, left (HCC) 08/04/2014   a.) Bx (+) for invasive mammary carcinoma with DCIS; ER/PR (+), HER2/neu (-). (+) lymphovascular invasion with 1 of 2 lymph nodes being (+). Treated with lumpectomy + adjuvant chemoradiation.   Breast wound, left, sequela 06/23/2016   Cancer of gastrointestinal tract (HCC) 08/2020   COPD (chronic obstructive pulmonary disease) (HCC)    Coronary artery disease  a.) MV 11/23/2020: no ischemia   Diastolic dysfunction 10/29/2020   a.) TTE 10/29/2020: EF >55%, no RMWAs, norm RVSF, triv AR/TR/PR, mild MR, G1DD   DOE (dyspnea on exertion)    Dyspnea    Elevated LFTs    Endometrial polyp 02/03/2021   a.) Pelvic US  --> 18 x 5 x 10 mm lenticular nodule in endrometrial canal; favored endometrial polyp vs. neoplasm. b.) Bx 02/23/2021 --> no dysplasia or malignancy; benign.   Family history of breast cancer    Family history of kidney cancer    Family history of pancreatic cancer    Family history of throat cancer    Fatigue 08/01/2016   GERD (gastroesophageal reflux disease)    occ-no meds   Gout     Helicobacter pylori gastritis 06/03/2019   Hydradenitis 01/23/2015   Hyperlipidemia    Hypertension    Left renal mass 12/06/2022   a.) CT AP 12/06/2022 --> 2.4 cm enhancing lesion of the lower pole of the LEFT kidney   Mastitis 10/15/2016   Neuroendocrine tumor 08/14/2020   a.) Rectal mass Bx (+) for well differentiated NET (grade I). IHC for cytokeratin AE1/AE3 and CD56 supports.   Osteoporosis of lumbar spine 03/12/2015   2021 - Prolia recommended by Oncology - to start once dental issues are resolved   Polyp of ascending colon    Polyp of descending colon    Polyp of sigmoid colon    Positive PPD, treated    PVD (peripheral vascular disease) with claudication (HCC)    Rectal polyp    T2DM (type 2 diabetes mellitus) (HCC) 06/2014    Past Surgical History:  Procedure Laterality Date   BIOPSY  11/11/2021   Procedure: BIOPSY;  Surgeon: Normie Becton., MD;  Location: [facility] ENDOSCOPY;  Service: Gastroenterology;;   BREAST BIOPSY Left 07/31/2014   IMC and DCIS    BREAST BIOPSY Left 03/08/2016   INTRADUCTAL PAPILLOMA    BREAST LUMPECTOMY Left 07/2014   IDC, DCIS, clear margins, positive LN   BREAST LUMPECTOMY Left 04/05/2016   excision of intraductal papilloma, no malignancy or atypia   BREAST LUMPECTOMY WITH NEEDLE LOCALIZATION Left 04/05/2016   Procedure: BREAST LUMPECTOMY WITH NEEDLE LOCALIZATION;  Surgeon: Kandis Ormond, MD;  Location: [facility] ORS;  Service: General;  Laterality: Left;   BREAST LUMPECTOMY WITH NEEDLE LOCALIZATION AND AXILLARY SENTINEL LYMPH NODE BX Left 08/27/14   CHOLECYSTECTOMY  09/07/2013   COLONOSCOPY WITH PROPOFOL  N/A 08/28/2015   Procedure: COLONOSCOPY WITH PROPOFOL ;  Surgeon: Marnee Sink, MD;  Location: [facility];  Service: Endoscopy;  Laterality: N/A;  Diabetic oral   COLONOSCOPY WITH PROPOFOL  N/A 08/14/2020   Procedure: COLONOSCOPY WITH PROPOFOL ;  Surgeon: Irby Mannan, MD;  Location: [facility] ENDOSCOPY;  Service: Endoscopy;   Laterality: N/A;   ENDOSCOPIC MUCOSAL RESECTION N/A 09/10/2020   Procedure: ENDOSCOPIC MUCOSAL RESECTION;  Surgeon: Brice Campi Albino Alu., MD;  Location: [facility] ENDOSCOPY;  Service: Gastroenterology;  Laterality: N/A;   ESOPHAGOGASTRODUODENOSCOPY  2014   gastritis   ESOPHAGOGASTRODUODENOSCOPY (EGD) WITH PROPOFOL  N/A 04/16/2019   Procedure: ESOPHAGOGASTRODUODENOSCOPY (EGD) WITH PROPOFOL ;  Surgeon: Irby Mannan, MD;  Location: [facility] ENDOSCOPY;  Service: Endoscopy;  Laterality: N/A;   ESOPHAGOGASTRODUODENOSCOPY (EGD) WITH PROPOFOL  N/A 10/03/2019   Procedure: ESOPHAGOGASTRODUODENOSCOPY (EGD) WITH PROPOFOL ;  Surgeon: Irby Mannan, MD;  Location: [facility] ENDOSCOPY;  Service: Endoscopy;  Laterality: N/A;   EUS N/A 09/10/2020   Procedure: LOWER ENDOSCOPIC ULTRASOUND (EUS);  Surgeon: Normie Becton., MD;  Location: [facility] ENDOSCOPY;  Service: Gastroenterology;  Laterality: N/A;   EUS N/A 11/11/2021   Procedure: LOWER ENDOSCOPIC ULTRASOUND (EUS);  Surgeon: Normie Becton., MD;  Location: [facility] ENDOSCOPY;  Service: Gastroenterology;  Laterality: N/A;   FLEXIBLE SIGMOIDOSCOPY N/A 08/27/2020   Procedure: FLEXIBLE SIGMOIDOSCOPY;  Surgeon: Irby Mannan, MD;  Location: [facility] ENDOSCOPY;  Service: Endoscopy;  Laterality: N/A;   FLEXIBLE SIGMOIDOSCOPY N/A 09/10/2020   Procedure: FLEXIBLE SIGMOIDOSCOPY;  Surgeon: Brice Campi Albino Alu., MD;  Location: [facility] ENDOSCOPY;  Service: Gastroenterology;  Laterality: N/A;   FLEXIBLE SIGMOIDOSCOPY N/A 11/11/2021   Procedure: FLEXIBLE SIGMOIDOSCOPY;  Surgeon: Brice Campi Albino Alu., MD;  Location: [facility] ENDOSCOPY;  Service: Gastroenterology;  Laterality: N/A;   FOREIGN BODY REMOVAL  09/10/2020   Procedure: FOREIGN BODY REMOVAL ;  Surgeon: Brice Campi Albino Alu., MD;  Location: [facility] ENDOSCOPY;  Service: Gastroenterology;;   HEMOSTASIS CLIP PLACEMENT  09/10/2020   Procedure: HEMOSTASIS CLIP PLACEMENT;  Surgeon: Normie Becton., MD;  Location: [facility] ENDOSCOPY;   Service: Gastroenterology;;   HYSTEROSCOPY WITH D & C N/A 03/25/2021   Procedure: DILATATION AND CURETTAGE Lorraine Roses;  Surgeon: Darl Edu, MD;  Location: [facility] ORS;  Service: Gynecology;  Laterality: N/A;   IR RADIOLOGIST EVAL & MGMT  01/03/2023   IR RADIOLOGIST EVAL & MGMT  01/31/2023   POLYPECTOMY  08/28/2015   Procedure: POLYPECTOMY INTESTINAL;  Surgeon: Marnee Sink, MD;  Location: [facility] SURGERY CNTR;  Service: Endoscopy;;  Rectal polyp   SUBMUCOSAL LIFTING INJECTION  09/10/2020   Procedure: SUBMUCOSAL LIFTING INJECTION;  Surgeon: Normie Becton., MD;  Location: [facility] ENDOSCOPY;  Service: Gastroenterology;;   TUBAL LIGATION Bilateral    UMBILICAL HERNIA REPAIR N/A 08/09/2016   Procedure: HERNIA REPAIR UMBILICAL ADULT;  Surgeon: Emmalene Hare, MD;  Location: [facility] ORS;  Service: General;  Laterality: N/A;   VENTRAL HERNIA REPAIR N/A 04/10/2015   Procedure: HERNIA REPAIR VENTRAL ADULT;  Surgeon: Benancio Bracket, MD;  Location: [facility] ORS;  Service: General;  Laterality: N/A;    Family History  Problem Relation Age of Onset   Cancer Mother 6       Pancreatic   Cancer Father        Throat dx 67s   Healthy Sister    Cancer Sister 73       unk metastatic   Kidney cancer Sister 33   HIV Brother    Breast cancer Maternal Grandmother 9   Breast cancer Cousin        d. 4s    Social History:  reports that she has been smoking cigarettes. She has a 22.5 pack-year smoking history. She has never used smokeless tobacco. She reports current alcohol use of about 2.0 standard drinks of alcohol per week. She reports that she does not use drugs.   Allergies:  Allergies  Allergen Reactions   Bactrim  [Sulfamethoxazole -Trimethoprim ] Shortness Of Breath and Nausea And Vomiting    Current Medications: Current Outpatient Medications  Medication Sig Dispense Refill   acetaminophen  (TYLENOL ) 500 MG tablet Take 1,000 mg by mouth every 6 (six) hours as needed for moderate pain.      albuterol  (VENTOLIN  HFA) 108 (90 Base) MCG/ACT inhaler INHALE 2 PUFFS INTO THE LUNGS EVERY 6 HOURS AS NEEDED FOR WHEEZING OR SHORTNESS OF BREATH 6.7 g 0   amLODipine  (NORVASC ) 10 MG tablet Take 1 tablet (10 mg total) by mouth daily. 90 tablet 1   aspirin  EC 81 MG tablet Take 81 mg by mouth daily.     atorvastatin  (LIPITOR) 10 MG tablet Take 1 tablet (10 mg total) by  mouth daily. 90 tablet 1   Blood Glucose Calibration (TRUE METRIX LEVEL 1) Low SOLN 1 each by In Vitro route daily as needed. 1 each 3   Blood Glucose Monitoring Suppl (TRUE METRIX METER) DEVI 1 each by Does not apply route daily. 100 Device 3   glucose blood (ACCU-CHEK GUIDE) test strip TEST TWICE DAILY 100 strip 12   irbesartan  (AVAPRO ) 300 MG tablet Take 1 tablet (300 mg total) by mouth daily. 90 tablet 1   metFORMIN  (GLUCOPHAGE -XR) 500 MG 24 hr tablet TAKE 1 TABLET BY MOUTH EVERY MORNING WITH BREAKFAST 90 tablet 1   methocarbamol (ROBAXIN) 500 MG tablet Take 500 mg by mouth at bedtime.     Multiple Vitamins-Minerals (MULTIVITAMIN WITH MINERALS) tablet Take 1 tablet by mouth daily.     triamterene -hydrochlorothiazide  (MAXZIDE-25) 37.5-25 MG tablet Take 1 tablet by mouth daily. 90 tablet 1   phenazopyridine  (PYRIDIUM ) 95 MG tablet Take 1 tablet (95 mg total) by mouth 3 (three) times daily as needed for pain. (Patient not taking: Reported on 09/20/2023) 6 tablet 0   No current facility-administered medications for this visit.   Review of Systems  Constitutional:  Negative for appetite change, chills, fatigue and fever.  HENT:   Negative for hearing loss and voice change.   Eyes:  Negative for eye problems.  Respiratory:  Negative for chest tightness and cough.   Cardiovascular:  Negative for chest pain.  Gastrointestinal:  Negative for abdominal distention, abdominal pain and blood in stool.  Endocrine: Negative for hot flashes.  Genitourinary:  Negative for difficulty urinating and frequency.   Musculoskeletal:  Negative for  arthralgias.  Skin:  Negative for itching and rash.  Neurological:  Negative for extremity weakness.  Hematological:  Negative for adenopathy.  Psychiatric/Behavioral:  Negative for confusion.       Vitals Blood pressure (!) 117/53, pulse 89, temperature 98.9 F (37.2 C), temperature source Tympanic, resp. rate 18, weight 208 lb 4.8 oz (94.5 kg), SpO2 97%.  Performance status (ECOG): 1 Physical Exam Vitals and nursing note reviewed.  Constitutional:      General: She is not in acute distress.    Appearance: She is well-developed. She is not diaphoretic.  Eyes:     General: No scleral icterus.    Conjunctiva/sclera: Conjunctivae normal.  Cardiovascular:     Rate and Rhythm: Normal rate and regular rhythm.  Pulmonary:     Effort: Pulmonary effort is normal.     Breath sounds: Normal breath sounds.  Abdominal:     General: Abdomen is flat.  Neurological:     Mental Status: She is alert and oriented to person, place, and time.  Psychiatric:        Mood and Affect: Mood normal.   Declined breast exam.   RADIOGRAPHIC STUDIES: I have personally reviewed the radiological images as listed and agreed with the findings in the report. CT Abdomen Pelvis W Contrast Result Date: 09/20/2023 CLINICAL DATA:  Gastrointestinal cancer primary neuroendocrine carcinoma rectum EXAM: CT ABDOMEN AND PELVIS WITH CONTRAST TECHNIQUE: Multidetector CT imaging of the abdomen and pelvis was performed using the standard protocol following bolus administration of intravenous contrast. RADIATION DOSE REDUCTION: This exam was performed according to the departmental dose-optimization program which includes automated exposure control, adjustment of the mA and/or kV according to patient size and/or use of iterative reconstruction technique. CONTRAST:  OMNIPAQUE  IOHEXOL  300 MG/ML  SOLN COMPARISON:  December 06, 2022 FINDINGS: Lower chest: No acute abnormality. Hepatobiliary: No focal liver abnormality is  seen. Status  post cholecystectomy. No biliary dilatation. Pancreas: Unremarkable. No pancreatic ductal dilatation or surrounding inflammatory changes. Spleen: Normal in size without focal abnormality. Adrenals/Urinary Tract: Adrenal glands are normal Right kidney is normal Left kidney status post cryoablation of the lower pole lesion. With a residual resultant area of decreased attenuation along the medial cortical region of the lower pole measuring 12 x 20 mm. No obvious enhancing masses. No perirenal collections. No perirenal masses. Small stable fatty replaced lymph node between the aorta and the kidney measuring 9 mm. No hydronephrosis. Stomach/Bowel: Stomach is within normal limits. Appendix appears normal. No evidence of bowel wall thickening, distention, or inflammatory changes. Vascular/Lymphatic: No significant vascular findings are present. No enlarged abdominal or pelvic lymph nodes. Reproductive: Uterus and bilateral adnexa are unremarkable. Other: No abdominal wall hernia or abnormality. No abdominopelvic ascites. Musculoskeletal: No fracture is seen. IMPRESSION: *Status post cryoablation of the lower pole left renal lesion. No obvious enhancing masses. No perirenal collections. *No evidence of metastatic disease. *Status post cholecystectomy. Electronically Signed   By: Fredrich Jefferson M.D.   On: 09/20/2023 11:32   MM 3D DIAGNOSTIC MAMMOGRAM BILATERAL BREAST Result Date: 09/13/2023 CLINICAL DATA:  Status post LEFT lumpectomy invasive ductal carcinoma in 2016, with negative margins. The patient reports a palpable area of concern in her lumpectomy site. She is also due for annual exam of the right breast. EXAM: DIGITAL DIAGNOSTIC BILATERAL MAMMOGRAM WITH TOMOSYNTHESIS AND CAD; ULTRASOUND LEFT BREAST LIMITED TECHNIQUE: Bilateral digital diagnostic mammography and breast tomosynthesis was performed. The images were evaluated with computer-aided detection. ; Targeted ultrasound examination of the left breast was  performed. COMPARISON:  Previous exam(s). ACR Breast Density Category b: There are scattered areas of fibroglandular density. FINDINGS: Mammogram: Subjacent to the palpable marker in the left breast, there is mammographically stable postlumpectomy changes including large dystrophic calcifications. No new suspicious mass, calcification, or other findings are identified in bilateral breasts. Ultrasound: On physical exam, no suspicious mass is palpated in the area of concern in the left breast. Postsurgical changes are noted status post left lumpectomy. Targeted left breast ultrasound was performed in the area of palpable concern at 8 o'clock 7 cm from nipple. This demonstrates normal fibroglandular tissue. No suspicious solid or cystic mass or area of shadowing is identified. IMPRESSION: 1. No suspicious mammographic or sonographic findings are identified in the area of palpable concern in the left breast. 2. No mammographic evidence of malignancy in bilateral breasts. RECOMMENDATION: Return to routine screening mammography is recommended. The patient will be due for screening in April 2026. I have discussed the findings and recommendations with the patient. If applicable, a reminder letter will be sent to the patient regarding the next appointment. BI-RADS CATEGORY  2: Benign. Electronically Signed   By: Sande Cromer M.D.   On: 09/13/2023 12:00   US  LIMITED ULTRASOUND INCLUDING AXILLA LEFT BREAST  Result Date: 09/13/2023 CLINICAL DATA:  Status post LEFT lumpectomy invasive ductal carcinoma in 2016, with negative margins. The patient reports a palpable area of concern in her lumpectomy site. She is also due for annual exam of the right breast. EXAM: DIGITAL DIAGNOSTIC BILATERAL MAMMOGRAM WITH TOMOSYNTHESIS AND CAD; ULTRASOUND LEFT BREAST LIMITED TECHNIQUE: Bilateral digital diagnostic mammography and breast tomosynthesis was performed. The images were evaluated with computer-aided detection. ; Targeted  ultrasound examination of the left breast was performed. COMPARISON:  Previous exam(s). ACR Breast Density Category b: There are scattered areas of fibroglandular density. FINDINGS: Mammogram: Subjacent to the palpable marker in the left  breast, there is mammographically stable postlumpectomy changes including large dystrophic calcifications. No new suspicious mass, calcification, or other findings are identified in bilateral breasts. Ultrasound: On physical exam, no suspicious mass is palpated in the area of concern in the left breast. Postsurgical changes are noted status post left lumpectomy. Targeted left breast ultrasound was performed in the area of palpable concern at 8 o'clock 7 cm from nipple. This demonstrates normal fibroglandular tissue. No suspicious solid or cystic mass or area of shadowing is identified. IMPRESSION: 1. No suspicious mammographic or sonographic findings are identified in the area of palpable concern in the left breast. 2. No mammographic evidence of malignancy in bilateral breasts. RECOMMENDATION: Return to routine screening mammography is recommended. The patient will be due for screening in April 2026. I have discussed the findings and recommendations with the patient. If applicable, a reminder letter will be sent to the patient regarding the next appointment. BI-RADS CATEGORY  2: Benign. Electronically Signed   By: Sande Cromer M.D.   On: 09/13/2023 12:00     Labs are reviewed and discussed with patient.    Latest Ref Rng & Units 09/13/2023    9:56 AM 08/28/2023   11:34 AM 01/11/2023   10:24 AM  CBC  WBC 4.0 - 10.5 K/uL 8.4  9.1  7.5   Hemoglobin 12.0 - 15.0 g/dL 16.1  09.6  04.5   Hematocrit 36.0 - 46.0 % 41.9  40.0  42.8   Platelets 150 - 400 K/uL 246  257  247       Latest Ref Rng & Units 09/13/2023    9:56 AM 08/28/2023   11:34 AM 01/11/2023   10:24 AM  CMP  Glucose 70 - 99 mg/dL 409  811  914   BUN 8 - 23 mg/dL 22  19  14    Creatinine 0.44 - 1.00 mg/dL  7.82  9.56  2.13   Sodium 135 - 145 mmol/L 134  139  141   Potassium 3.5 - 5.1 mmol/L 3.4  3.3  3.5   Chloride 98 - 111 mmol/L 102  101  106   CO2 22 - 32 mmol/L 20  27  26    Calcium  8.9 - 10.3 mg/dL 9.1  9.7  8.8   Total Protein 6.5 - 8.1 g/dL 7.8  8.0    Total Bilirubin 0.0 - 1.2 mg/dL 0.8  0.6    Alkaline Phos 38 - 126 U/L 89  93    AST 15 - 41 U/L 20  19    ALT 0 - 44 U/L 20  21

## 2023-09-20 NOTE — Assessment & Plan Note (Signed)
 Lung-RADS 2 on  CT chest lung cancer screening.  Continue follow up with lung caner screening program.

## 2023-10-02 ENCOUNTER — Telehealth: Payer: Self-pay | Admitting: *Deleted

## 2023-10-02 NOTE — Telephone Encounter (Signed)
 I told the patient that they had sent a MyChart and they did not see any cancer in all on the scan.  She was happy with those results

## 2023-10-04 ENCOUNTER — Telehealth: Payer: Self-pay | Admitting: *Deleted

## 2023-10-04 NOTE — Telephone Encounter (Signed)
 The patient called today and said that she wants to get in touch with me in social worker to help her because [place] she needs somebody to help her because she is going to be getting out of home.

## 2023-10-21 ENCOUNTER — Other Ambulatory Visit: Payer: Self-pay | Admitting: Internal Medicine

## 2023-10-21 DIAGNOSIS — E118 Type 2 diabetes mellitus with unspecified complications: Secondary | ICD-10-CM

## 2023-10-21 DIAGNOSIS — I10 Essential (primary) hypertension: Secondary | ICD-10-CM

## 2023-10-25 NOTE — Telephone Encounter (Signed)
 Requested Prescriptions  Pending Prescriptions Disp Refills   irbesartan  (AVAPRO ) 300 MG tablet [Pharmacy Med Name: IRBESARTAN  300MG  TABLETS] 90 tablet 0    Sig: TAKE 1 TABLET(300 MG) BY MOUTH DAILY     Cardiovascular:  Angiotensin Receptor Blockers Failed - 10/25/2023  8:39 AM      Failed - Cr in normal range and within 180 days    Creatinine  Date Value Ref Range Status  09/13/2023 1.20 (H) 0.44 - 1.00 mg/dL Final  16/02/9603 5.40 mg/dL Final    Comment:    9.81-1.91 NOTE: New Reference Range  08/05/14          Failed - K in normal range and within 180 days    Potassium  Date Value Ref Range Status  09/13/2023 3.4 (L) 3.5 - 5.1 mmol/L Final  09/09/2014 4.3 mmol/L Final    Comment:    3.5-5.1 NOTE: New Reference Range  08/05/14          Passed - Patient is not pregnant      Passed - Last BP in normal range    BP Readings from Last 1 Encounters:  09/20/23 (!) 117/53         Passed - Valid encounter within last 6 months    Recent Outpatient Visits           1 month ago Annual physical exam   [facility], [place], [place]       Future Appointments             In 3 months McGowan, Danne Dustman, PA-C [facility] [place]             metFORMIN  (GLUCOPHAGE -XR) 500 MG 24 hr tablet [Pharmacy Med Name: METFORMIN  ER 500MG  24HR TABS] 90 tablet 1    Sig: TAKE 1 TABLET BY MOUTH EVERY MORNING WITH BREAKFAST     Endocrinology:  Diabetes - Biguanides Failed - 10/25/2023  8:39 AM      Failed - Cr in normal range and within 360 days    Creatinine  Date Value Ref Range Status  09/13/2023 1.20 (H) 0.44 - 1.00 mg/dL Final  47/82/9562 1.30 mg/dL Final    Comment:    8.65-7.84 NOTE: New Reference Range  08/05/14          Failed - eGFR in normal range and within 360 days    EGFR (African American)  Date Value Ref Range Status  09/09/2014 >60  Final   GFR calc Af Amer  Date Value Ref Range Status   02/11/2020 >60 >60 mL/min Final   EGFR (Non-African Amer.)  Date Value Ref Range Status  09/09/2014 >60  Final    Comment:    eGFR values <58mL/min/1.73 m2 may be an indication of chronic kidney disease (CKD). Calculated eGFR is useful in patients with stable renal function. The eGFR calculation will not be reliable in acutely ill patients when serum creatinine is changing rapidly. It is not useful in patients on dialysis. The eGFR calculation may not be applicable to patients at the low and high extremes of body sizes, pregnant women, and vegetarians.    GFR, Estimated  Date Value Ref Range Status  09/13/2023 51 (L) >60 mL/min Final    Comment:    (NOTE) Calculated using the CKD-EPI Creatinine Equation (2021)          Failed - B12 Level in normal range and within 720 days    No  results found for: "VITAMINB12"       Passed - HBA1C is between 0 and 7.9 and within 180 days    Hgb A1c MFr Bld  Date Value Ref Range Status  08/28/2023 6.4 (H) 4.8 - 5.6 % Final    Comment:    (NOTE) Pre diabetes:          5.7%-6.4%  Diabetes:              >6.4%  Glycemic control for   <7.0% adults with diabetes          Passed - Valid encounter within last 6 months    Recent Outpatient Visits           1 month ago Annual physical exam   [facility], [place], [place]       Future Appointments             In 3 months McGowan, Nyra Bellis [facility] [place]            Passed - CBC within normal limits and completed in the last 12 months    WBC  Date Value Ref Range Status  08/28/2023 9.1 4.0 - 10.5 K/uL Final   WBC Count  Date Value Ref Range Status  09/13/2023 8.4 4.0 - 10.5 K/uL Final   RBC  Date Value Ref Range Status  09/13/2023 4.93 3.87 - 5.11 MIL/uL Final   Hemoglobin  Date Value Ref Range Status  09/13/2023 13.6 12.0 - 15.0 g/dL Final  28/41/3244 01.0 11.1 - 15.9 g/dL Final   HCT   Date Value Ref Range Status  09/13/2023 41.9 36.0 - 46.0 % Final   Hematocrit  Date Value Ref Range Status  05/26/2017 40.7 34.0 - 46.6 % Final   MCHC  Date Value Ref Range Status  09/13/2023 32.5 30.0 - 36.0 g/dL Final   [facility]  Date Value Ref Range Status  09/13/2023 27.6 26.0 - 34.0 pg Final   MCV  Date Value Ref Range Status  09/13/2023 85.0 80.0 - 100.0 fL Final  05/26/2017 80 79 - 97 fL Final  09/09/2014 83 80 - 100 fL Final   No results found for: "PLTCOUNTKUC", "LABPLAT", "POCPLA" RDW  Date Value Ref Range Status  09/13/2023 13.2 11.5 - 15.5 % Final  05/26/2017 14.6 12.3 - 15.4 % Final  09/09/2014 13.7 11.5 - 14.5 % Final          amLODipine  (NORVASC ) 10 MG tablet [Pharmacy Med Name: AMLODIPINE  BESYLATE 10MG TABLETS] 90 tablet 0    Sig: TAKE 1 TABLET(10 MG) BY MOUTH DAILY     Cardiovascular: Calcium  Channel Blockers 2 Passed - 10/25/2023  8:39 AM      Passed - Last BP in normal range    BP Readings from Last 1 Encounters:  09/20/23 (!) 117/53         Passed - Last Heart Rate in normal range    Pulse Readings from Last 1 Encounters:  09/20/23 89         Passed - Valid encounter within last 6 months    Recent Outpatient Visits           1 month ago Annual physical exam   [facility], [place], [place]       Future Appointments             In 3 months McGowan,  Nyra Bellis [facility] [place]

## 2023-10-27 ENCOUNTER — Other Ambulatory Visit: Payer: Self-pay | Admitting: Internal Medicine

## 2023-10-27 DIAGNOSIS — J432 Centrilobular emphysema: Secondary | ICD-10-CM

## 2023-11-09 ENCOUNTER — Ambulatory Visit: Admitting: Emergency Medicine

## 2023-11-09 VITALS — Ht 68.0 in | Wt 200.0 lb

## 2023-11-09 DIAGNOSIS — Z0000 Encounter for general adult medical examination without abnormal findings: Secondary | ICD-10-CM

## 2023-11-09 NOTE — Patient Instructions (Signed)
 Donna Riley , Thank you for taking time out of your busy schedule to complete your Annual Wellness Visit with me. I enjoyed our conversation and look forward to speaking with you again next year. I, as well as your care team,  appreciate your ongoing commitment to your health goals. Please review the following plan we discussed and let me know if I can assist you in the future. Your Game plan/ To Do List    Referrals: None  Follow up Visits: Next Medicare AWV with our clinical staff: 11/21/24 @ 3:10pm (PHONE VISIT)   Have you seen your provider in the last 6 months (3 months if uncontrolled diabetes)? Yes Next Office Visit with your provider: 01/08/24 @ 10:00am with Dr. Gala Jubilee  Clinician Recommendations: Get a tetanus shot at your local pharmacy.  Aim for 30 minutes of exercise or brisk walking, 6-8 glasses of water, and 5 servings of fruits and vegetables each day.       This is a list of the screening recommended for you and due dates:  Health Maintenance  Topic Date Due   DTaP/Tdap/Td vaccine (1 - Tdap) Never done   COVID-19 Vaccine (3 - Pfizer risk series) 12/11/2019   Flu Shot  12/29/2023   Hemoglobin A1C  02/27/2024   Eye exam for diabetics  03/22/2024   Complete foot exam   04/23/2024   Screening for Lung Cancer  08/17/2024   Yearly kidney health urinalysis for diabetes  08/27/2024   Yearly kidney function blood test for diabetes  09/12/2024   Mammogram  09/12/2024   Medicare Annual Wellness Visit  11/08/2024   Colon Cancer Screening  08/14/2025   DEXA scan (bone density measurement)  11/01/2027   Pap with HPV screening  11/07/2027   Pneumococcal Vaccination  Completed   HIV Screening  Completed   Zoster (Shingles) Vaccine  Completed   Hepatitis C Screening  Addressed   HPV Vaccine  Aged Out   Meningitis B Vaccine  Aged Out    Advanced directives: (ACP Link)Information on Advanced Care Planning can be found at [place]  Secretary of [facility] Advance Health Care  Directives Advance Health Care Directives. [email or web address] You may also get the forms at your doctor's office. Advance Care Planning is important because it:  [x]  Makes sure you receive the medical care that is consistent with your values, goals, and preferences  [x]  It provides guidance to your family and loved ones and reduces their decisional burden about whether or not they are making the right decisions based on your wishes.  Follow the link provided in your after visit summary or read over the paperwork we have mailed to you to help you started getting your Advance Directives in place. If you need assistance in completing these, please reach out to us  so that we can help you!  See attachments for Preventive Care and Fall Prevention Tips.   Fall Prevention in the Home, Adult Falls can cause injuries and affect people of all ages. There are many simple things that you can do to make your home safe and to help prevent falls. If you need it, ask for help making these changes. What actions can I take to prevent falls? General information Use good lighting in all rooms. Make sure to: Replace any light bulbs that burn out. Turn on lights if it is dark and use night-lights. Keep items that you use often in easy-to-reach places. Lower the shelves around your home if needed. Move furniture so that there are  clear paths around it. Do not keep throw rugs or other things on the floor that can make you trip. If any of your floors are uneven, fix them. Add color or contrast paint or tape to clearly mark and help you see: Grab bars or handrails. First and last steps of staircases. Where the edge of each step is. If you use a ladder or stepladder: Make sure that it is fully opened. Do not climb a closed ladder. Make sure the sides of the ladder are locked in place. Have someone hold the ladder while you use it. Know where your pets are as you move through your home. What can I do in the bathroom?      Keep the floor dry. Clean up any water that is on the floor right away. Remove soap buildup in the bathtub or shower. Buildup makes bathtubs and showers slippery. Use non-skid mats or decals on the floor of the bathtub or shower. Attach bath mats securely with double-sided, non-slip rug tape. If you need to sit down while you are in the shower, use a non-slip stool. Install grab bars by the toilet and in the bathtub and shower. Do not use towel bars as grab bars. What can I do in the bedroom? Make sure that you have a light by your bed that is easy to reach. Do not use any sheets or blankets on your bed that hang to the floor. Have a firm bench or chair with side arms that you can use for support when you get dressed. What can I do in the kitchen? Clean up any spills right away. If you need to reach something above you, use a sturdy step stool that has a grab bar. Keep electrical cables out of the way. Do not use floor polish or wax that makes floors slippery. What can I do with my stairs? Do not leave anything on the stairs. Make sure that you have a light switch at the top and the bottom of the stairs. Have them installed if you do not have them. Make sure that there are handrails on both sides of the stairs. Fix handrails that are broken or loose. Make sure that handrails are as long as the staircases. Install non-slip stair treads on all stairs in your home if they do not have carpet. Avoid having throw rugs at the top or bottom of stairs, or secure the rugs with carpet tape to prevent them from moving. Choose a carpet design that does not hide the edge of steps on the stairs. Make sure that carpet is firmly attached to the stairs. Fix any carpet that is loose or worn. What can I do on the outside of my home? Use bright outdoor lighting. Repair the edges of walkways and driveways and fix any cracks. Clear paths of anything that can make you trip, such as tools or rocks. Add color or  contrast paint or tape to clearly mark and help you see high doorway thresholds. Trim any bushes or trees on the main path into your home. Check that handrails are securely fastened and in good repair. Both sides of all steps should have handrails. Install guardrails along the edges of any raised decks or porches. Have leaves, snow, and ice cleared regularly. Use sand, salt, or ice melt on walkways during winter months if you live where there is ice and snow. In the garage, clean up any spills right away, including grease or oil spills. What other actions can I  take? Review your medicines with your health care provider. Some medicines can make you confused or feel dizzy. This can increase your chance of falling. Wear closed-toe shoes that fit well and support your feet. Wear shoes that have rubber soles and low heels. Use a cane, walker, scooter, or crutches that help you move around if needed. Talk with your provider about other ways that you can decrease your risk of falls. This may include seeing a physical therapist to learn to do exercises to improve movement and strength. Where to find more information Centers for Disease Control and Prevention, STEADI: TonerPromos.no [employer] on Aging: BaseRingTones.pl National Institute on Aging: BaseRingTones.pl Contact a health care provider if: You are afraid of falling at home. You feel weak, drowsy, or dizzy at home. You fall at home. Get help right away if you: Lose consciousness or have trouble moving after a fall. Have a fall that causes a head injury. These symptoms may be an emergency. Get help right away. Call 911. Do not wait to see if the symptoms will go away. Do not drive yourself to the hospital. This information is not intended to replace advice given to you by your health care provider. Make sure you discuss any questions you have with your health care provider. Document Revised: 01/17/2022 Document Reviewed: 01/17/2022 Elsevier Patient  Education  2024 [employer].

## 2023-11-09 NOTE — Progress Notes (Signed)
 Subjective:   Donna Riley is a 63 y.o. who presents for a Medicare Wellness preventive visit.  As a reminder, Annual Wellness Visits don't include a physical exam, and some assessments may be limited, especially if this visit is performed virtually. We may recommend an in-person follow-up visit with your provider if needed.  Visit Complete: Virtual I connected with  Donna Riley on 11/09/23 by a audio enabled telemedicine application and verified that I am speaking with the correct person using two identifiers.  Patient Location: Home  Provider Location: Home Office  I discussed the limitations of evaluation and management by telemedicine. The patient expressed understanding and agreed to proceed.  Vital Signs: Because this visit was a virtual/telehealth visit, some criteria may be missing or patient reported. Any vitals not documented were not able to be obtained and vitals that have been documented are patient reported.  VideoDeclined- This patient declined Librarian, academic. Therefore the visit was completed with audio only.  Persons Participating in Visit: Patient.  AWV Questionnaire: No: Patient Medicare AWV questionnaire was not completed prior to this visit.  Cardiac Risk Factors include: advanced age (>19men, >85 women);diabetes mellitus;dyslipidemia;hypertension;obesity (BMI >30kg/m2);smoking/ tobacco exposure     Objective:    Today's Vitals   11/09/23 1508 11/09/23 1509  Weight: 200 lb (90.7 kg)   Height: 5' 8 (1.727 m)   PainSc:  5    Body mass index is 30.41 kg/m.     11/09/2023    3:22 PM 01/13/2023    9:17 AM 01/10/2023    2:36 PM 11/10/2022    8:23 AM 09/13/2022    9:50 AM 07/18/2022    4:44 PM 03/07/2022    9:20 AM  Advanced Directives  Does Patient Have a Medical Advance Directive? No  No No No No No  Would patient like information on creating a medical advance directive? Yes (MAU/Ambulatory/Procedural Areas -  Information given) No - Patient declined No - Patient declined No - Patient declined No - Patient declined  Yes (ED - Information included in AVS)    Current Medications (verified) Outpatient Encounter Medications as of 11/09/2023  Medication Sig   acetaminophen  (TYLENOL ) 500 MG tablet Take 1,000 mg by mouth every 6 (six) hours as needed for moderate pain.   albuterol  (VENTOLIN  HFA) 108 (90 Base) MCG/ACT inhaler INHALE 2 PUFFS INTO THE LUNGS EVERY 6 HOURS AS NEEDED FOR WHEEZING OR SHORTNESS OF BREATH   amLODipine  (NORVASC ) 10 MG tablet TAKE 1 TABLET(10 MG) BY MOUTH DAILY   aspirin  EC 81 MG tablet Take 81 mg by mouth daily.   atorvastatin  (LIPITOR) 10 MG tablet Take 1 tablet (10 mg total) by mouth daily.   Blood Glucose Calibration (TRUE METRIX LEVEL 1) Low SOLN 1 each by In Vitro route daily as needed.   Blood Glucose Monitoring Suppl (TRUE METRIX METER) DEVI 1 each by Does not apply route daily.   glucose blood (ACCU-CHEK GUIDE) test strip TEST TWICE DAILY   irbesartan  (AVAPRO ) 300 MG tablet TAKE 1 TABLET(300 MG) BY MOUTH DAILY   metFORMIN  (GLUCOPHAGE -XR) 500 MG 24 hr tablet TAKE 1 TABLET BY MOUTH EVERY MORNING WITH BREAKFAST   methocarbamol (ROBAXIN) 500 MG tablet Take 500 mg by mouth at bedtime.   Multiple Vitamins-Minerals (MULTIVITAMIN WITH MINERALS) tablet Take 1 tablet by mouth daily.   triamterene -hydrochlorothiazide  (MAXZIDE-25) 37.5-25 MG tablet Take 1 tablet by mouth daily.   phenazopyridine  (PYRIDIUM ) 95 MG tablet Take 1 tablet (95 mg total) by mouth 3 (  three) times daily as needed for pain. (Patient not taking: Reported on 11/09/2023)   No facility-administered encounter medications on file as of 11/09/2023.    Allergies (verified) Bactrim  [sulfamethoxazole -trimethoprim ]   History: Past Medical History:  Diagnosis Date   Abdominal bloating    Acute midline low back pain without sciatica 10/09/2018   Aortic atherosclerosis (HCC)    Arthritis    Breast cancer, left (HCC)  08/04/2014   a.) Bx (+) for invasive mammary carcinoma with DCIS; ER/PR (+), HER2/neu (-). (+) lymphovascular invasion with 1 of 2 lymph nodes being (+). Treated with lumpectomy + adjuvant chemoradiation.   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 neoplasm. b.) Bx 02/23/2021 --> no dysplasia or malignancy; benign.   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 IHC for cytokeratin AE1/AE3 and CD56 supports.   Osteoporosis of lumbar spine 03/12/2015   2021 - Prolia recommended by Oncology - to start once dental issues are resolved   Polyp of ascending colon    Polyp of descending colon    Polyp of sigmoid colon    Positive PPD, treated    PVD (peripheral vascular disease) with claudication (HCC)    Rectal polyp    T2DM (type 2 diabetes mellitus) (HCC) 06/2014   Past Surgical History:  Procedure Laterality Date   BIOPSY  11/11/2021   Procedure: BIOPSY;  Surgeon: Normie Becton., MD;  Location:  [facility] ENDOSCOPY;  Service: Gastroenterology;;   BREAST BIOPSY Left 07/31/2014   IMC and DCIS    BREAST BIOPSY Left 03/08/2016   INTRADUCTAL PAPILLOMA    BREAST LUMPECTOMY Left 07/2014   IDC, DCIS, clear margins, positive LN   BREAST LUMPECTOMY Left 04/05/2016   excision of intraductal papilloma, no malignancy or atypia   BREAST LUMPECTOMY WITH NEEDLE LOCALIZATION Left 04/05/2016   Procedure: BREAST LUMPECTOMY WITH NEEDLE LOCALIZATION;  Surgeon: Kandis Ormond, MD;  Location: [facility] ORS;  Service: General;  Laterality: Left;   BREAST LUMPECTOMY WITH NEEDLE LOCALIZATION AND AXILLARY SENTINEL LYMPH NODE BX Left 08/27/14   CHOLECYSTECTOMY  09/07/2013   COLONOSCOPY WITH PROPOFOL  N/A 08/28/2015   Procedure: COLONOSCOPY WITH PROPOFOL ;  Surgeon: Marnee Sink, MD;  Location: [facility] SURGERY [facility]TR;  Service: Endoscopy;  Laterality: N/A;  Diabetic oral   COLONOSCOPY WITH PROPOFOL  N/A 08/14/2020   Procedure: COLONOSCOPY WITH PROPOFOL ;  Surgeon: Irby Mannan,  MD;  Location: [facility] ENDOSCOPY;  Service: Endoscopy;  Laterality: N/A;   ENDOSCOPIC MUCOSAL RESECTION N/A 09/10/2020   Procedure: ENDOSCOPIC MUCOSAL RESECTION;  Surgeon: Brice Campi Albino Alu., MD;  Location: [facility] ENDOSCOPY;  Service: Gastroenterology;  Laterality: N/A;   ESOPHAGOGASTRODUODENOSCOPY  2014   gastritis   ESOPHAGOGASTRODUODENOSCOPY (EGD) WITH PROPOFOL  N/A 04/16/2019   Procedure: ESOPHAGOGASTRODUODENOSCOPY (EGD) WITH PROPOFOL ;  Surgeon: Irby Mannan, MD;  Location: [facility] ENDOSCOPY;  Service: Endoscopy;  Laterality: N/A;   ESOPHAGOGASTRODUODENOSCOPY (EGD) WITH PROPOFOL  N/A 10/03/2019   Procedure: ESOPHAGOGASTRODUODENOSCOPY (EGD) WITH PROPOFOL ;  Surgeon: Irby Mannan, MD;  Location: [facility] ENDOSCOPY;  Service: Endoscopy;  Laterality: N/A;   EUS N/A 09/10/2020   Procedure: LOWER ENDOSCOPIC ULTRASOUND (EUS);  Surgeon: Normie Becton., MD;  Location: [facility] ENDOSCOPY;  Service: Gastroenterology;  Laterality: N/A;   EUS N/A  11/11/2021   Procedure: LOWER ENDOSCOPIC ULTRASOUND (EUS);  Surgeon: Normie Becton., MD;  Location: [facility] ENDOSCOPY;  Service: Gastroenterology;  Laterality: N/A;   FLEXIBLE SIGMOIDOSCOPY N/A 08/27/2020   Procedure: FLEXIBLE SIGMOIDOSCOPY;  Surgeon: Irby Mannan, MD;  Location: [facility] ENDOSCOPY;  Service: Endoscopy;  Laterality: N/A;   FLEXIBLE SIGMOIDOSCOPY N/A 09/10/2020   Procedure: FLEXIBLE SIGMOIDOSCOPY;  Surgeon: Brice Campi Albino Alu., MD;  Location: [facility] ENDOSCOPY;  Service: Gastroenterology;  Laterality: N/A;   FLEXIBLE SIGMOIDOSCOPY N/A 11/11/2021   Procedure: FLEXIBLE SIGMOIDOSCOPY;  Surgeon: Brice Campi Albino Alu., MD;  Location: [facility] ENDOSCOPY;  Service: Gastroenterology;  Laterality: N/A;   FOREIGN BODY REMOVAL  09/10/2020   Procedure: FOREIGN BODY REMOVAL ;  Surgeon: Brice Campi Albino Alu., MD;  Location: [facility] ENDOSCOPY;  Service: Gastroenterology;;   HEMOSTASIS CLIP PLACEMENT  09/10/2020   Procedure: HEMOSTASIS CLIP PLACEMENT;  Surgeon: Normie Becton., MD;  Location: [facility] ENDOSCOPY;  Service: Gastroenterology;;   HYSTEROSCOPY WITH D & C N/A 03/25/2021   Procedure: DILATATION AND CURETTAGE Lorraine Roses;  Surgeon: Darl Edu, MD;  Location: [facility] ORS;  Service: Gynecology;  Laterality: N/A;   IR RADIOLOGIST EVAL & MGMT  01/03/2023   IR RADIOLOGIST EVAL & MGMT  01/31/2023   POLYPECTOMY  08/28/2015   Procedure: POLYPECTOMY INTESTINAL;  Surgeon: Marnee Sink, MD;  Location: [facility] SURGERY CNTR;  Service: Endoscopy;;  Rectal polyp   SUBMUCOSAL LIFTING INJECTION  09/10/2020   Procedure: SUBMUCOSAL LIFTING INJECTION;  Surgeon: Normie Becton., MD;  Location: [facility] ENDOSCOPY;  Service: Gastroenterology;;   TUBAL LIGATION Bilateral    UMBILICAL HERNIA REPAIR N/A 08/09/2016   Procedure: HERNIA REPAIR UMBILICAL ADULT;  Surgeon: Emmalene Hare, MD;  Location: [facility] ORS;  Service: General;  Laterality: N/A;   VENTRAL HERNIA REPAIR N/A 04/10/2015   Procedure: HERNIA REPAIR VENTRAL  ADULT;  Surgeon: Benancio Bracket, MD;  Location: [facility] ORS;  Service: General;  Laterality: N/A;   Family History  Problem Relation Age of Onset   Cancer Mother 63       Pancreatic   Cancer Father        Throat dx 106s   Healthy Sister    Cancer Sister 27       unk metastatic   Kidney cancer Sister 38   HIV Brother    Breast cancer Maternal Grandmother 73   Breast cancer Cousin        d. 58s   Social History   Socioeconomic History   Marital status: Widowed    Spouse name: Not on file   Number of children: 4   Years of education: Not on file   Highest education level: 10th grade  Occupational History  Occupation: Disabled  Tobacco Use   Smoking status: Every Day    Current packs/day: 0.50    Average packs/day: 0.5 packs/day for 47.4 years (23.7 ttl pk-yrs)    Types: Cigarettes    Start date: 1978    Passive exposure: Never   Smokeless tobacco: Never   Tobacco comments:    Smokes 7 ciggs daily.    11/09/23 3-4 cigs daily/pbt  Vaping Use   Vaping status: Never Used  Substance and Sexual Activity   Alcohol use: Yes    Alcohol/week: 2.0 standard drinks of alcohol    Types: 2 Cans of beer per week    Comment: weekends - beer    Drug use: No   Sexual activity: Not Currently  Other Topics Concern   Not on file  Social History Narrative   Pt lives alone, son lives 2 doors down.   Social Drivers of Corporate investment banker Strain: Low Risk  (11/09/2023)   Overall Financial Resource Strain (CARDIA)    Difficulty of Paying Living Expenses: Not hard at all  Food Insecurity: No Food Insecurity (11/09/2023)   Hunger Vital Sign    Worried About Running Out of Food in the Last Year: Never true    Ran Out of Food in the Last Year: Never true  Transportation Needs: No Transportation Needs (11/09/2023)   PRAPARE - Administrator, Civil Service (Medical): No    Lack of Transportation (Non-Medical): No  Physical Activity: Inactive (11/09/2023)   Exercise Vital  Sign    Days of Exercise per Week: 0 days    Minutes of Exercise per Session: 0 min  Stress: No Stress Concern Present (11/09/2023)   [employer] of Occupational Health - Occupational Stress Questionnaire    Feeling of Stress: Not at all  Social Connections: Socially Isolated (11/09/2023)   Social Connection and Isolation Panel    Frequency of Communication with Friends and Family: More than three times a week    Frequency of Social Gatherings with Friends and Family: Three times a week    Attends Religious Services: Never    Active Member of Clubs or Organizations: No    Attends Banker Meetings: Never    Marital Status: Widowed    Tobacco Counseling Ready to quit: Not Answered Counseling given: No Tobacco comments: Smokes 7 ciggs daily. 11/09/23 3-4 cigs daily/pbt    Clinical Intake:  Pre-visit preparation completed: Yes  Pain : 0-10 Pain Score: 5  Pain Type: Chronic pain Pain Location: Other (Comment) (lymph nodes from breast cancer) Pain Descriptors / Indicators: Burning     BMI - recorded: 30.41 Nutritional Status: BMI > 30  Obese Nutritional Risks: None Diabetes: Yes CBG done?: No (FBS 119 per patient) Did pt. bring in CBG monitor from home?: No  Lab Results  Component Value Date   HGBA1C 6.4 (H) 08/28/2023   HGBA1C 6.4 (A) 04/24/2023   HGBA1C 6.1 (A) 12/19/2022     How often do you need to have someone help you when you read instructions, pamphlets, or other written materials from your doctor or pharmacy?: 1 - Never  Interpreter Needed?: No  Information entered by :: Donna Riley, CMA   Activities of Daily Living     11/09/2023    3:11 PM 01/13/2023    9:16 AM  In your present state of health, do you have any difficulty performing the following activities:  Hearing? 0 0  Vision? 0 0  Difficulty concentrating or making  decisions? 0 0  Walking or climbing stairs? 1 0  Comment can't walk for a long time due to hip pain   Dressing  or bathing? 0 0  Doing errands, shopping? 0   Preparing Food and eating ? N   Using the Toilet? N   In the past six months, have you accidently leaked urine? N   Do you have problems with loss of bowel control? N   Managing your Medications? N   Managing your Finances? N   Housekeeping or managing your Housekeeping? N     Patient Care Team: Sheron Dixons, MD as PCP - General (Internal Medicine) Irby Mannan, MD (Inactive) as Consulting Physician (Gastroenterology) Anell Baptist, DPM as Referring Physician (Podiatry) Darl Edu, MD as Referring Physician (Obstetrics and Gynecology) Darr, Jacob, PA-C (Orthopedic Surgery) Timmy Forbes, MD as Consulting Physician (Hematology and Oncology) [facility], [facility] (Optometry)  I have updated your Care Teams any recent Medical Services you may have received from other providers in the past year.     Assessment:   This is a routine wellness examination for Donna Riley.  Hearing/Vision screen Hearing Screening - Comments:: Denies hearing loss Vision Screening - Comments:: Gets DM eye exams, [facility], Joy Kimball   Goals Addressed             This Visit's Progress    Patient Stated       Get better       Depression Screen     11/09/2023    3:19 PM 08/28/2023   10:48 AM 05/03/2023    2:08 PM 04/24/2023   11:06 AM 12/19/2022   11:00 AM 11/10/2022    8:22 AM 07/20/2022   10:35 AM  PHQ 2/9 Scores  PHQ - 2 Score 1 0 0 0 4 0 0  PHQ- 9 Score 3 2 2 1 8  0 3    Fall Risk     11/09/2023    3:24 PM 08/28/2023   10:41 AM 05/03/2023    2:08 PM 04/24/2023   11:06 AM 12/19/2022   11:00 AM  Fall Risk   Falls in the past year? 0 0 0 0 0  Number falls in past yr: 0 0 0 0 0  Injury with Fall? 0 0 0 0 0  Risk for fall due to : No Fall Risks No Fall Risks No Fall Risks No Fall Risks No Fall Risks  Follow up Falls evaluation completed Falls evaluation completed Falls evaluation completed Falls evaluation completed  Falls evaluation completed    MEDICARE RISK AT HOME:  Medicare Risk at Home Any stairs in or around the home?: No If so, are there any without handrails?: No Home free of loose throw rugs in walkways, pet beds, electrical cords, etc?: Yes Adequate lighting in your home to reduce risk of falls?: Yes Life alert?: No Use of a cane, walker or w/c?: No Grab bars in the bathroom?: Yes Shower chair or bench in shower?: Yes Elevated toilet seat or a handicapped toilet?: No  TIMED UP AND GO:  Was the test performed?  No  Cognitive Function: 6CIT completed        11/09/2023    3:25 PM 11/10/2022    8:28 AM 11/03/2021    8:21 AM 07/10/2019   10:18 AM 07/09/2018   11:01 AM  6CIT Screen  What Year? 0 points 0 points 0 points 0 points 0 points  What month? 0 points 0 points 0 points 0  points 0 points  What time? 0 points 0 points 0 points 0 points 0 points  Count back from 20 2 points 0 points 2 points 0 points 0 points  Months in reverse 2 points 2 points 0 points 0 points 0 points  Repeat phrase 0 points 2 points 2 points 0 points 0 points  Total Score 4 points 4 points 4 points 0 points 0 points    Immunizations Immunization History  Administered Date(s) Administered   Influenza Inj Mdck Quad With Preservative 03/12/2018   Influenza, Seasonal, Injecte, Preservative Fre 04/24/2023   Influenza,inj,Quad PF,6+ Mos 02/22/2016, 02/18/2019, 03/05/2021, 03/17/2022   Influenza-Unspecified 01/30/2014, 02/25/2017, 03/12/2018   PFIZER(Purple Top)SARS-COV-2 Vaccination 10/21/2019, 11/13/2019   PNEUMOCOCCAL CONJUGATE-20 11/03/2021   Pneumococcal Polysaccharide-23 01/25/2017   Zoster Recombinant(Shingrix ) 07/09/2021, 10/03/2021    Screening Tests Health Maintenance  Topic Date Due   DTaP/Tdap/Td (1 - Tdap) Never done   COVID-19 Vaccine (3 - Pfizer risk series) 12/11/2019   INFLUENZA VACCINE  12/29/2023   HEMOGLOBIN A1C  02/27/2024   OPHTHALMOLOGY EXAM  03/22/2024   FOOT EXAM  04/23/2024    Lung Cancer Screening  08/17/2024   Diabetic kidney evaluation - Urine ACR  08/27/2024   Diabetic kidney evaluation - eGFR measurement  09/12/2024   MAMMOGRAM  09/12/2024   Medicare Annual Wellness (AWV)  11/08/2024   Colonoscopy  08/14/2025   DEXA SCAN  11/01/2027   Cervical Cancer Screening (HPV/Pap Cotest)  11/07/2027   Pneumococcal Vaccine 59-58 Years old  Completed   HIV Screening  Completed   Zoster Vaccines- Shingrix   Completed   Hepatitis C Screening  Addressed   HPV VACCINES  Aged Out   Meningococcal B Vaccine  Aged Out    Health Maintenance  Health Maintenance Due  Topic Date Due   DTaP/Tdap/Td (1 - Tdap) Never done   COVID-19 Vaccine (3 - Pfizer risk series) 12/11/2019   Health Maintenance Items Addressed: See Nurse Notes at the end of this note  Additional Screening:  Vision Screening: Recommended annual ophthalmology exams for early detection of glaucoma and other disorders of the eye. Would you like a referral to an eye doctor? No    Dental Screening: Recommended annual dental exams for proper oral hygiene  Community Resource Referral / Chronic Care Management: CRR required this visit?  No   CCM required this visit?  No   Plan:    I have personally reviewed and noted the following in the patient's chart:   Medical and social history Use of alcohol, tobacco or illicit drugs  Current medications and supplements including opioid prescriptions. Patient is not currently taking opioid prescriptions. Functional ability and status Nutritional status Physical activity Advanced directives List of other physicians Hospitalizations, surgeries, and ER visits in previous 12 months Vitals Screenings to include cognitive, depression, and falls Referrals and appointments  In addition, I have reviewed and discussed with patient certain preventive protocols, quality metrics, and best practice recommendations. A written personalized care plan for preventive  services as well as general preventive health recommendations were provided to patient.   Donna Riley, CMA   11/09/2023   After Visit Summary: (MyChart) Due to this being a telephonic visit, the after visit summary with patients personalized plan was offered to patient via MyChart   Notes:  6 CIT Score - 4 FBS this morning 119 per patient Needs Tdap (pharmacy) Declined DM & Nutrition education referral Declined Covid vaccines

## 2023-11-28 ENCOUNTER — Other Ambulatory Visit: Payer: Self-pay | Admitting: Internal Medicine

## 2023-11-28 DIAGNOSIS — J432 Centrilobular emphysema: Secondary | ICD-10-CM

## 2023-11-29 NOTE — Telephone Encounter (Signed)
 Requested Prescriptions  Pending Prescriptions Disp Refills   albuterol  (VENTOLIN  HFA) 108 (90 Base) MCG/ACT inhaler [Pharmacy Med Name: ALBUTEROL  HFA INH (200 PUFFS) 6.7GM] 6.7 g 0    Sig: INHALE 2 PUFFS INTO THE LUNGS EVERY 6 HOURS AS NEEDED FOR WHEEZING OR SHORTNESS OF BREATH     Pulmonology:  Beta Agonists 2 Passed - 11/29/2023  5:02 PM      Passed - Last BP in normal range    BP Readings from Last 1 Encounters:  09/20/23 (!) 117/53         Passed - Last Heart Rate in normal range    Pulse Readings from Last 1 Encounters:  09/20/23 89         Passed - Valid encounter within last 12 months    Recent Outpatient Visits           3 months ago Annual physical exam   [facility] at [facility], Leita DEL, MD       Future Appointments             In 2 months McGowan, Clotilda DELENA RIGGERS [facility] [place]

## 2024-01-01 ENCOUNTER — Encounter: Payer: Self-pay | Admitting: Urology

## 2024-01-02 DIAGNOSIS — R0789 Other chest pain: Secondary | ICD-10-CM | POA: Diagnosis not present

## 2024-01-02 DIAGNOSIS — F17209 Nicotine dependence, unspecified, with unspecified nicotine-induced disorders: Secondary | ICD-10-CM | POA: Diagnosis not present

## 2024-01-02 DIAGNOSIS — E782 Mixed hyperlipidemia: Secondary | ICD-10-CM | POA: Diagnosis not present

## 2024-01-02 DIAGNOSIS — R600 Localized edema: Secondary | ICD-10-CM | POA: Diagnosis not present

## 2024-01-02 DIAGNOSIS — E118 Type 2 diabetes mellitus with unspecified complications: Secondary | ICD-10-CM | POA: Diagnosis not present

## 2024-01-02 DIAGNOSIS — I25118 Atherosclerotic heart disease of native coronary artery with other forms of angina pectoris: Secondary | ICD-10-CM | POA: Diagnosis not present

## 2024-01-02 DIAGNOSIS — I70219 Atherosclerosis of native arteries of extremities with intermittent claudication, unspecified extremity: Secondary | ICD-10-CM | POA: Diagnosis not present

## 2024-01-02 DIAGNOSIS — I10 Essential (primary) hypertension: Secondary | ICD-10-CM | POA: Diagnosis not present

## 2024-01-02 DIAGNOSIS — I700 Atherosclerosis of aorta: Secondary | ICD-10-CM | POA: Diagnosis not present

## 2024-01-03 ENCOUNTER — Other Ambulatory Visit: Payer: Self-pay | Admitting: Nurse Practitioner

## 2024-01-03 DIAGNOSIS — I10 Essential (primary) hypertension: Secondary | ICD-10-CM

## 2024-01-03 DIAGNOSIS — E118 Type 2 diabetes mellitus with unspecified complications: Secondary | ICD-10-CM

## 2024-01-03 DIAGNOSIS — I25118 Atherosclerotic heart disease of native coronary artery with other forms of angina pectoris: Secondary | ICD-10-CM

## 2024-01-03 DIAGNOSIS — E782 Mixed hyperlipidemia: Secondary | ICD-10-CM

## 2024-01-03 DIAGNOSIS — I70219 Atherosclerosis of native arteries of extremities with intermittent claudication, unspecified extremity: Secondary | ICD-10-CM

## 2024-01-05 ENCOUNTER — Ambulatory Visit
Admission: RE | Admit: 2024-01-05 | Discharge: 2024-01-05 | Disposition: A | Discharge status: Home / Self Care | Source: Ambulatory Visit | Attending: Nurse Practitioner | Admitting: Nurse Practitioner

## 2024-01-05 DIAGNOSIS — E782 Mixed hyperlipidemia: Secondary | ICD-10-CM | POA: Insufficient documentation

## 2024-01-05 DIAGNOSIS — I70219 Atherosclerosis of native arteries of extremities with intermittent claudication, unspecified extremity: Secondary | ICD-10-CM | POA: Insufficient documentation

## 2024-01-05 DIAGNOSIS — I25118 Atherosclerotic heart disease of native coronary artery with other forms of angina pectoris: Secondary | ICD-10-CM | POA: Diagnosis not present

## 2024-01-05 DIAGNOSIS — R600 Localized edema: Secondary | ICD-10-CM | POA: Diagnosis not present

## 2024-01-05 DIAGNOSIS — I10 Essential (primary) hypertension: Secondary | ICD-10-CM | POA: Diagnosis not present

## 2024-01-05 DIAGNOSIS — E118 Type 2 diabetes mellitus with unspecified complications: Secondary | ICD-10-CM | POA: Insufficient documentation

## 2024-01-08 ENCOUNTER — Ambulatory Visit (INDEPENDENT_AMBULATORY_CARE_PROVIDER_SITE_OTHER): Admitting: Internal Medicine

## 2024-01-08 ENCOUNTER — Encounter: Payer: Self-pay | Admitting: Internal Medicine

## 2024-01-08 ENCOUNTER — Other Ambulatory Visit
Admission: RE | Admit: 2024-01-08 | Discharge: 2024-01-08 | Disposition: A | Discharge status: Home / Self Care | Attending: Internal Medicine | Admitting: Internal Medicine

## 2024-01-08 VITALS — BP 126/74 | HR 76 | Ht 68.0 in | Wt 193.0 lb

## 2024-01-08 DIAGNOSIS — I10 Essential (primary) hypertension: Secondary | ICD-10-CM | POA: Diagnosis not present

## 2024-01-08 DIAGNOSIS — E1169 Type 2 diabetes mellitus with other specified complication: Secondary | ICD-10-CM

## 2024-01-08 DIAGNOSIS — Z7984 Long term (current) use of oral hypoglycemic drugs: Secondary | ICD-10-CM | POA: Diagnosis not present

## 2024-01-08 DIAGNOSIS — Z85528 Personal history of other malignant neoplasm of kidney: Secondary | ICD-10-CM | POA: Diagnosis not present

## 2024-01-08 DIAGNOSIS — E785 Hyperlipidemia, unspecified: Secondary | ICD-10-CM

## 2024-01-08 DIAGNOSIS — E118 Type 2 diabetes mellitus with unspecified complications: Secondary | ICD-10-CM | POA: Diagnosis not present

## 2024-01-08 LAB — BASIC METABOLIC PANEL WITH GFR
Anion gap: 12 (ref 5–15)
BUN: 16 mg/dL (ref 8–23)
CO2: 25 mmol/L (ref 22–32)
Calcium: 9.4 mg/dL (ref 8.9–10.3)
Chloride: 100 mmol/L (ref 98–111)
Creatinine, Ser: 0.84 mg/dL (ref 0.44–1.00)
GFR, Estimated: >60 mL/min (ref >60)
Glucose, Bld: 123 mg/dL — ABNORMAL HIGH (ref 70–99)
Potassium: 3.5 mmol/L (ref 3.5–5.1)
Sodium: 137 mmol/L (ref 135–145)

## 2024-01-08 LAB — LDL CHOLESTEROL, DIRECT: Direct LDL: 51 mg/dL (ref 0–99)

## 2024-01-08 MED ORDER — TRIAMTERENE-HCTZ 37.5-25 MG PO TABS: 1.0000 | ORAL_TABLET | Freq: Every day | ORAL | 1 refills | Status: AC

## 2024-01-08 NOTE — Assessment & Plan Note (Signed)
 LDL is  Lab Results  Component Value Date   LDLCALC NOT CALCULATED 08/28/2023   Currently taking atorvastatin .  No medication side effects or other concerns. Recommended LDL goal is < 55.  Last LDL could not be calculated.

## 2024-01-08 NOTE — Progress Notes (Signed)
 Date:  01/08/2024   Name:  Donna Riley   DOB:  July 01, 1960   MRN:  [id number]   Chief Complaint: Diabetes  Diabetes She presents for her follow-up diabetic visit. She has type 2 diabetes mellitus. Pertinent negatives for hypoglycemia include no dizziness, headaches or nervousness/anxiousness. Pertinent negatives for diabetes include no chest pain, no fatigue and no weakness.  Hypertension This is a chronic problem. The problem is controlled. Pertinent negatives include no chest pain, headaches, palpitations or shortness of breath. Past treatments include angiotensin blockers, diuretics and calcium  channel blockers.  Hyperlipidemia This is a chronic problem. The problem is uncontrolled. Recent lipid tests were reviewed and are high. Pertinent negatives include no chest pain, myalgias or shortness of breath. Current antihyperlipidemic treatment includes statins.    Review of Systems  Constitutional:  Negative for fatigue and unexpected weight change.  HENT:  Negative for trouble swallowing.   Eyes:  Negative for visual disturbance.  Respiratory:  Negative for cough, chest tightness, shortness of breath and wheezing.   Cardiovascular:  Positive for leg swelling. Negative for chest pain and palpitations.  Gastrointestinal:  Negative for abdominal pain, constipation and diarrhea.  Musculoskeletal:  Positive for arthralgias. Negative for myalgias.  Neurological:  Negative for dizziness, weakness, light-headedness and headaches.  Psychiatric/Behavioral:  Negative for dysphoric mood and sleep disturbance. The patient is not nervous/anxious.      Lab Results  Component Value Date   NA 134 (L) 09/13/2023   K 3.4 (L) 09/13/2023   CO2 20 (L) 09/13/2023   GLUCOSE 133 (H) 09/13/2023   BUN 22 09/13/2023   CREATININE 1.20 (H) 09/13/2023   CALCIUM  9.1 09/13/2023   GFRNONAA 51 (L) 09/13/2023   Lab Results  Component Value Date   CHOL 103 08/28/2023   HDL 32 (L) 08/28/2023   LDLCALC NOT  CALCULATED 08/28/2023   TRIG 379 (H) 08/28/2023   CHOLHDL 3.2 08/28/2023   Lab Results  Component Value Date   TSH 1.801 08/28/2023   Lab Results  Component Value Date   HGBA1C 6.4 (H) 08/28/2023   Lab Results  Component Value Date   WBC 8.4 09/13/2023   HGB 13.6 09/13/2023   HCT 41.9 09/13/2023   MCV 85.0 09/13/2023   PLT 246 09/13/2023   Lab Results  Component Value Date   ALT 20 09/13/2023   AST 20 09/13/2023   ALKPHOS 89 09/13/2023   BILITOT 0.8 09/13/2023   No results found for: MARIEN BOLLS, VD25OH   Patient Active Problem List   Diagnosis Date Noted   History of renal cell cancer 12/19/2022   History of breast cancer 11/07/2022   History of rectal cancer 11/07/2022   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   Lung nodule 03/07/2022   Centrilobular emphysema (HCC) 02/17/2022   Genetic testing 06/10/2021   Endometrial polyp    Benign neuroendocrine tumor of sigmoid colon    History of rectal polyps    Nodule of colon    Sebaceous cyst 08/11/2020   Osteopenia 07/19/2020   Breast pain, right 07/03/2020   Atherosclerosis of abdominal aorta (HCC) 02/06/2020   Tobacco use disorder, continuous 01/14/2020   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   Lumbosacral spondylosis without myelopathy 11/22/2018   Vertigo 10/09/2018   Hyperlipidemia associated with type 2 diabetes mellitus (HCC) 04/11/2018   Gastroparesis 04/11/2018   Long term (current) use of aromatase inhibitors 12/25/2017   Arthritis    Elevated  LFTs    Essential hypertension    Positive PPD, treated    Irritable bowel syndrome with both constipation and diarrhea 07/20/2015   Type II diabetes mellitus with complication (HCC) 06/22/2015   Pre-ulcerative calluses 06/22/2015   Neuropathy 06/22/2015   Hot flash, menopausal 04/09/2015   Atherosclerotic peripheral vascular disease with intermittent claudication  (HCC) 03/25/2015   Malignant neoplasm of left breast in female, estrogen receptor positive (HCC) 05/30/2014   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    Allergies  Allergen Reactions   Bactrim  [Sulfamethoxazole -Trimethoprim ] Shortness Of Breath and Nausea And Vomiting    Past Surgical History:  Procedure Laterality Date   BIOPSY  11/11/2021   Procedure: BIOPSY;  Surgeon: Wilhelmenia Aloha Raddle., MD;  Location: [facility] ENDOSCOPY;  Service: Gastroenterology;;   BREAST BIOPSY Left 07/31/2014   IMC and DCIS    BREAST BIOPSY Left 03/08/2016   INTRADUCTAL PAPILLOMA    BREAST LUMPECTOMY Left 07/2014   IDC, DCIS, clear margins, positive LN   BREAST LUMPECTOMY Left 04/05/2016   excision of intraductal papilloma, no malignancy or atypia   BREAST LUMPECTOMY WITH NEEDLE LOCALIZATION Left 04/05/2016   Procedure: BREAST LUMPECTOMY WITH NEEDLE LOCALIZATION;  Surgeon: Dorothyann LITTIE Husk, MD;  Location: [facility] ORS;  Service: General;  Laterality: Left;   BREAST LUMPECTOMY WITH NEEDLE LOCALIZATION AND AXILLARY SENTINEL LYMPH NODE BX Left 08/27/14   CHOLECYSTECTOMY  09/07/2013   COLONOSCOPY WITH PROPOFOL  N/A 08/28/2015   Procedure: COLONOSCOPY WITH PROPOFOL ;  Surgeon: Rogelia Copping, MD;  Location: [facility];  Service: Endoscopy;  Laterality: N/A;  Diabetic oral   COLONOSCOPY WITH PROPOFOL  N/A 08/14/2020   Procedure: COLONOSCOPY WITH PROPOFOL ;  Surgeon: Janalyn Keene NOVAK, MD;  Location: [facility] ENDOSCOPY;  Service: Endoscopy;  Laterality: N/A;   ENDOSCOPIC MUCOSAL RESECTION N/A 09/10/2020   Procedure: ENDOSCOPIC MUCOSAL RESECTION;  Surgeon: Wilhelmenia Aloha Raddle., MD;  Location: [facility] ENDOSCOPY;  Service: Gastroenterology;  Laterality: N/A;   ESOPHAGOGASTRODUODENOSCOPY  2014   gastritis   ESOPHAGOGASTRODUODENOSCOPY (EGD) WITH PROPOFOL  N/A 04/16/2019   Procedure: ESOPHAGOGASTRODUODENOSCOPY (EGD) WITH PROPOFOL ;  Surgeon: Janalyn Keene NOVAK, MD;  Location: [facility] ENDOSCOPY;  Service: Endoscopy;  Laterality: N/A;    ESOPHAGOGASTRODUODENOSCOPY (EGD) WITH PROPOFOL  N/A 10/03/2019   Procedure: ESOPHAGOGASTRODUODENOSCOPY (EGD) WITH PROPOFOL ;  Surgeon: Janalyn Keene NOVAK, MD;  Location: [facility] ENDOSCOPY;  Service: Endoscopy;  Laterality: N/A;   EUS N/A 09/10/2020   Procedure: LOWER ENDOSCOPIC ULTRASOUND (EUS);  Surgeon: Wilhelmenia Aloha Raddle., MD;  Location: [facility] ENDOSCOPY;  Service: Gastroenterology;  Laterality: N/A;   EUS N/A 11/11/2021   Procedure: LOWER ENDOSCOPIC ULTRASOUND (EUS);  Surgeon: Wilhelmenia Aloha Raddle., MD;  Location: [facility] ENDOSCOPY;  Service: Gastroenterology;  Laterality: N/A;   FLEXIBLE SIGMOIDOSCOPY N/A 08/27/2020   Procedure: FLEXIBLE SIGMOIDOSCOPY;  Surgeon: Janalyn Keene NOVAK, MD;  Location: [facility] ENDOSCOPY;  Service: Endoscopy;  Laterality: N/A;   FLEXIBLE SIGMOIDOSCOPY N/A 09/10/2020   Procedure: FLEXIBLE SIGMOIDOSCOPY;  Surgeon: Wilhelmenia Aloha Raddle., MD;  Location: [facility] ENDOSCOPY;  Service: Gastroenterology;  Laterality: N/A;   FLEXIBLE SIGMOIDOSCOPY N/A 11/11/2021   Procedure: FLEXIBLE SIGMOIDOSCOPY;  Surgeon: Wilhelmenia Aloha Raddle., MD;  Location: [facility] ENDOSCOPY;  Service: Gastroenterology;  Laterality: N/A;   FOREIGN BODY REMOVAL  09/10/2020   Procedure: FOREIGN BODY REMOVAL ;  Surgeon: Wilhelmenia Aloha Raddle., MD;  Location: [facility] ENDOSCOPY;  Service: Gastroenterology;;   HEMOSTASIS CLIP PLACEMENT  09/10/2020   Procedure: HEMOSTASIS CLIP PLACEMENT;  Surgeon: Wilhelmenia Aloha Raddle., MD;  Location: [facility] ENDOSCOPY;  Service: Gastroenterology;;   HYSTEROSCOPY WITH D & C N/A 03/25/2021  Procedure: DILATATION AND CURETTAGE /HYSTEROSCOPY;  Surgeon: Lake Read, MD;  Location: [facility] ORS;  Service: Gynecology;  Laterality: N/A;   IR RADIOLOGIST EVAL & MGMT  01/03/2023   IR RADIOLOGIST EVAL & MGMT  01/31/2023   POLYPECTOMY  08/28/2015   Procedure: POLYPECTOMY INTESTINAL;  Surgeon: Rogelia Copping, MD;  Location[facility]ERY [facility];  Service: Endoscopy;;  Rectal polyp   SUBMUCOSAL LIFTING INJECTION   09/10/2020   Procedure: SUBMUCOSAL LIFTING INJECTION;  Surgeon: Wilhelmenia Aloha Raddle., MD;  Location: [facility] ENDOSCOPY;  Service: Gastroenterology;;   TUBAL LIGATION Bilateral    UMBILICAL HERNIA REPAIR N/A 08/09/2016   Procedure: HERNIA REPAIR UMBILICAL ADULT;  Surgeon: Aloysius Plant, MD;  Location: [facility] ORS;  Service: General;  Laterality: N/A;   VENTRAL HERNIA REPAIR N/A 04/10/2015   Procedure: HERNIA REPAIR VENTRAL ADULT;  Surgeon: Larinda Unknown Sharps, MD;  Location: [facility] ORS;  Service: General;  Laterality: N/A;    Social History   Tobacco Use   Smoking status: Every Day    Current packs/day: 0.50    Average packs/day: 0.5 packs/day for 47.6 years (23.8 ttl pk-yrs)    Types: Cigarettes    Start date: 1978    Passive exposure: Never   Smokeless tobacco: Never   Tobacco comments:    Smokes 7 ciggs daily.    11/09/23 3-4 cigs daily/pbt  Vaping Use   Vaping status: Never Used  Substance Use Topics   Alcohol use: Yes    Alcohol/week: 2.0 standard drinks of alcohol    Types: 2 Cans of beer per week    Comment: weekends - beer    Drug use: No     Medication list has been reviewed and updated.  Current Meds  Medication Sig   acetaminophen  (TYLENOL ) 500 MG tablet Take 1,000 mg by mouth every 6 (six) hours as needed for moderate pain.   albuterol  (VENTOLIN  HFA) 108 (90 Base) MCG/ACT inhaler INHALE 2 PUFFS INTO THE LUNGS EVERY 6 HOURS AS NEEDED FOR WHEEZING OR SHORTNESS OF BREATH   amLODipine  (NORVASC ) 10 MG tablet TAKE 1 TABLET(10 MG) BY MOUTH DAILY   aspirin  EC 81 MG tablet Take 81 mg by mouth daily.   atorvastatin  (LIPITOR) 10 MG tablet Take 1 tablet (10 mg total) by mouth daily.   Blood Glucose Calibration (TRUE METRIX LEVEL 1) Low SOLN 1 each by In Vitro route daily as needed.   Blood Glucose Monitoring Suppl (TRUE METRIX METER) DEVI 1 each by Does not apply route daily.   glucose blood (ACCU-CHEK GUIDE) test strip TEST TWICE DAILY   irbesartan  (AVAPRO ) 300 MG tablet TAKE 1  TABLET(300 MG) BY MOUTH DAILY   metFORMIN  (GLUCOPHAGE -XR) 500 MG 24 hr tablet TAKE 1 TABLET BY MOUTH EVERY MORNING WITH BREAKFAST   methocarbamol (ROBAXIN) 500 MG tablet Take 500 mg by mouth at bedtime.   Multiple Vitamins-Minerals (MULTIVITAMIN WITH MINERALS) tablet Take 1 tablet by mouth daily.   [DISCONTINUED] triamterene -hydrochlorothiazide  (MAXZIDE-25) 37.5-25 MG tablet Take 1 tablet by mouth daily.       01/08/2024   10:15 AM 08/28/2023   10:48 AM 05/03/2023    2:08 PM 04/24/2023   11:07 AM  GAD 7 : Generalized Anxiety Score  Nervous, Anxious, on Edge 0 0 0 0  Control/stop worrying 0 0 0 0  Worry too much - different things 0 0 0 0  Trouble relaxing 0 0 0 0  Restless 0 0 0 0  Easily annoyed or irritable 0 0 0 0  Afraid - awful might  happen 0 0 0 0  Total GAD 7 Score 0 0 0 0  Anxiety Difficulty Not difficult at all Not difficult at all Not difficult at all Not difficult at all       01/08/2024   10:15 AM 11/09/2023    3:19 PM 08/28/2023   10:48 AM  Depression screen PHQ 2/9  Decreased Interest 0 0 0  Down, Depressed, Hopeless 0 1 0  PHQ - 2 Score 0 1 0  Altered sleeping 0 1 2  Tired, decreased energy 0 1 0  Change in appetite 2 0 0  Feeling bad or failure about yourself  0 0 0  Trouble concentrating 0 0 0  Moving slowly or fidgety/restless 0 0 0  Suicidal thoughts 0 0 0  PHQ-9 Score 2 3 2   Difficult doing work/chores Not difficult at all Not difficult at all Not difficult at all    BP Readings from Last 3 Encounters:  01/08/24 126/74  09/20/23 (!) 117/53  08/28/23 132/70    Physical Exam Vitals and nursing note reviewed.  Constitutional:      General: She is not in acute distress.    Appearance: She is well-developed.  HENT:     Head: Normocephalic and atraumatic.  Cardiovascular:     Rate and Rhythm: Normal rate and regular rhythm.     Heart sounds: No murmur heard. Pulmonary:     Effort: Pulmonary effort is normal. No respiratory distress.     Breath  sounds: No wheezing or rhonchi.  Musculoskeletal:     Cervical back: Normal range of motion.     Right lower leg: No edema.     Left lower leg: No edema.  Lymphadenopathy:     Cervical: No cervical adenopathy.  Skin:    General: Skin is warm and dry.     Capillary Refill: Capillary refill takes less than 2 seconds.     Findings: No rash.  Neurological:     General: No focal deficit present.     Mental Status: She is alert and oriented to person, place, and time.  Psychiatric:        Mood and Affect: Mood normal.        Behavior: Behavior normal.    Diabetic Foot Exam - Simple   Simple Foot Form Diabetic Foot exam was performed with the following findings: Yes 01/08/2024 10:26 AM  Visual Inspection No deformities, no ulcerations, no other skin breakdown bilaterally: Yes Sensation Testing Intact to touch and monofilament testing bilaterally: Yes Pulse Check Posterior Tibialis and Dorsalis pulse intact bilaterally: Yes Comments      Wt Readings from Last 3 Encounters:  01/08/24 193 lb (87.5 kg)  11/09/23 200 lb (90.7 kg)  09/20/23 208 lb 4.8 oz (94.5 kg)    BP 126/74   Pulse 76   Ht 5' 8 (1.727 m)   Wt 193 lb (87.5 kg)   SpO2 95%   BMI 29.35 kg/m   Assessment and Plan:  Problem List Items Addressed This Visit       Unprioritized   Essential hypertension - Primary (Chronic)   Relevant Medications   triamterene -hydrochlorothiazide  (MAXZIDE-25) 37.5-25 MG tablet   Other Relevant Orders   Basic metabolic panel with GFR   History of renal cell cancer   Doing well s/p ablation 2024 Recent CT 08/2023 *Status post cryoablation of the lower pole left renal lesion. No obvious enhancing masses. No perirenal collections. *No evidence of metastatic disease. *Status post cholecystectomy. Renal function has  been stable.      Hyperlipidemia associated with type 2 diabetes mellitus (HCC) (Chronic)   LDL is  Lab Results  Component Value Date   LDLCALC NOT CALCULATED  08/28/2023   Currently taking atorvastatin .  No medication side effects or other concerns. Recommended LDL goal is < 55.  Last LDL could not be calculated.       Relevant Medications   triamterene -hydrochlorothiazide  (MAXZIDE-25) 37.5-25 MG tablet   Other Relevant Orders   Direct LDL   Type II diabetes mellitus with complication (HCC) (Chronic)   Relevant Orders   Hemoglobin A1c   Other Visit Diagnoses       Long term current use of oral hypoglycemic drug           Return in about 4 months (around 05/09/2024) for DM, HTN.    Leita HILARIO Adie, MD [facility] [place]

## 2024-01-08 NOTE — Assessment & Plan Note (Addendum)
 Doing well s/p ablation 2024 Recent CT 08/2023 *Status post cryoablation of the lower pole left renal lesion. No obvious enhancing masses. No perirenal collections. *No evidence of metastatic disease. *Status post cholecystectomy. Renal function has been stable.

## 2024-01-09 ENCOUNTER — Ambulatory Visit: Payer: Self-pay | Admitting: Internal Medicine

## 2024-01-09 LAB — HEMOGLOBIN A1C
Hgb A1c MFr Bld: 6.3 % — ABNORMAL HIGH (ref 4.8–5.6)
Mean Plasma Glucose: 134 mg/dL

## 2024-01-16 DIAGNOSIS — I70219 Atherosclerosis of native arteries of extremities with intermittent claudication, unspecified extremity: Secondary | ICD-10-CM | POA: Diagnosis not present

## 2024-01-16 DIAGNOSIS — I25118 Atherosclerotic heart disease of native coronary artery with other forms of angina pectoris: Secondary | ICD-10-CM | POA: Diagnosis not present

## 2024-01-16 DIAGNOSIS — F17209 Nicotine dependence, unspecified, with unspecified nicotine-induced disorders: Secondary | ICD-10-CM | POA: Diagnosis not present

## 2024-01-16 DIAGNOSIS — R600 Localized edema: Secondary | ICD-10-CM | POA: Diagnosis not present

## 2024-01-18 ENCOUNTER — Other Ambulatory Visit: Payer: Self-pay | Admitting: Internal Medicine

## 2024-01-18 DIAGNOSIS — E1169 Type 2 diabetes mellitus with other specified complication: Secondary | ICD-10-CM

## 2024-01-19 NOTE — Telephone Encounter (Signed)
 Requested by interface surescripts. Future visit 05/13/24. Requested Prescriptions  Pending Prescriptions Disp Refills   atorvastatin  (LIPITOR) 10 MG tablet [Pharmacy Med Name: ATORVASTATIN  10MG  TABLETS] 90 tablet 1    Sig: TAKE 1 TABLET(10 MG) BY MOUTH DAILY     Cardiovascular:  Antilipid - Statins Failed - 01/19/2024 11:07 AM      Failed - Lipid Panel in normal range within the last 12 months    Cholesterol  Date Value Ref Range Status  08/28/2023 103 0 - 200 mg/dL Final   LDL Cholesterol  Date Value Ref Range Status  08/28/2023 NOT CALCULATED 0 - 99 mg/dL Final    Comment:           Total Cholesterol/HDL:CHD Risk Coronary Heart Disease Risk Table                     Men   Women  1/2 Average Risk   3.4   3.3  Average Risk       5.0   4.4  2 X Average Risk   9.6   7.1  3 X Average Risk  23.4   11.0        Use the calculated Patient Ratio above and the CHD Risk Table to determine the patient's CHD Risk.        ATP III CLASSIFICATION (LDL):  <100     mg/dL   Optimal  899-870  mg/dL   Near or Above                    Optimal  130-159  mg/dL   Borderline  839-810  mg/dL   High  >809     mg/dL   Very High Performed at [facility], [address] [place], [place] [address]    Direct LDL  Date Value Ref Range Status  01/08/2024 51 0 - 99 mg/dL Final    Comment:    Performed at [facility], [address], [place], [place] [address]   HDL  Date Value Ref Range Status  08/28/2023 32 (L) >40 mg/dL Final   Triglycerides  Date Value Ref Range Status  08/28/2023 379 (H) <150 mg/dL Final         Passed - Patient is not pregnant      Passed - Valid encounter within last 12 months    Recent Outpatient Visits           1 week ago Essential hypertension   [place] [facility] at [facility], Leita DEL, MD   4 months ago Annual physical exam   [place] [facility] at [facility],  Leita DEL, MD       Future Appointments             In 2 weeks [place], Clotilda DELENA RIGGERS [facility] [place]

## 2024-02-05 NOTE — Progress Notes (Unsigned)
 02/07/2024 9:31 PM   Donna Riley 1961/03/13 [id number]  Referring provider: Justus Leita VEAR, MD [address] [place],  [place] [address]  Urological history: 1. Left renal mass - contrast CT(11/2022) - Enhancing lesion of the lower pole of the left kidney measuring 2.4 cm increased in size when compared with the prior and, highly concerning for renal cell carcinoma  - cryoablation of the left renal mass (01/13/2023)   2. High risk hematuria - smoker - contrast CT (927975) enhancing left renal mass (treated with IR) - cysto (01/2023) NED  No chief complaint on file.  HPI: Donna Riley is a 63 y.o. woman who presents today for 6 months follow up.    Previous records reviewed.  She had a contrast CT in April and it did not show any enhancing masses and no evidence of metastatic disease.  UA***           PMH: Past Medical History:  Diagnosis Date   Abdominal bloating    Acute midline low back pain without sciatica 10/09/2018   Aortic atherosclerosis (HCC)    Arthritis    Breast cancer, left (HCC) 08/04/2014   a.) Bx (+) for invasive mammary carcinoma with DCIS; ER/PR (+), HER2/neu (-). (+) lymphovascular invasion with 1 of 2 lymph nodes being (+). Treated with lumpectomy + adjuvant chemoradiation.   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 neoplasm. b.) Bx 02/23/2021 --> no dysplasia or malignancy; benign.   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 IHC for cytokeratin AE1/AE3 and CD56 supports.   Osteoporosis of lumbar spine 03/12/2015   2021 - Prolia recommended by Oncology - to start once dental issues are resolved   Polyp of ascending colon    Polyp of descending colon    Polyp of sigmoid colon    Positive PPD, treated    PVD (peripheral vascular disease) with claudication (HCC)    Rectal polyp    T2DM (type 2 diabetes mellitus) (HCC) 06/2014    Surgical History: Past Surgical History:  Procedure Laterality Date   BIOPSY  11/11/2021   Procedure: BIOPSY;  Surgeon: Wilhelmenia Aloha Raddle., MD;  Location[facility] ENDOSCOPY;  Service: Gastroenterology;;   BREAST BIOPSY Left 07/31/2014   [facility] and DCIS    BREAST BIOPSY Left 03/08/2016   INTRADUCTAL PAPILLOMA  BREAST LUMPECTOMY Left 07/2014   [facility], DCIS, clear margins, positive LN   BREAST LUMPECTOMY Left 04/05/2016   excision of intraductal papilloma, no malignancy or atypia   BREAST LUMPECTOMY WITH NEEDLE LOCALIZATION Left 04/05/2016   Procedure: BREAST LUMPECTOMY WITH NEEDLE LOCALIZATION;  Surgeon: Dorothyann LITTIE Husk, MD;  Location: [facility] ORS;  Service: General;  Laterality: Left;   BREAST LUMPECTOMY WITH NEEDLE LOCALIZATION AND AXILLARY SENTINEL LYMPH NODE BX Left 08/27/14   CHOLECYSTECTOMY  09/07/2013   COLONOSCOPY WITH PROPOFOL  N/A 08/28/2015   Procedure: COLONOSCOPY WITH PROPOFOL ;  Surgeon: Rogelia Copping, MD;  Location: [facility] SURGERY CNTR;  Service: Endoscopy;  Laterality: N/A;   Diabetic oral   COLONOSCOPY WITH PROPOFOL  N/A 08/14/2020   Procedure: COLONOSCOPY WITH PROPOFOL ;  Surgeon: Janalyn Keene NOVAK, MD;  Location: [facility] ENDOSCOPY;  Service: Endoscopy;  Laterality: N/A;   ENDOSCOPIC MUCOSAL RESECTION N/A 09/10/2020   Procedure: ENDOSCOPIC MUCOSAL RESECTION;  Surgeon: Wilhelmenia Aloha Raddle., MD;  Location: [facility] ENDOSCOPY;  Service: Gastroenterology;  Laterality: N/A;   ESOPHAGOGASTRODUODENOSCOPY  2014   gastritis   ESOPHAGOGASTRODUODENOSCOPY (EGD) WITH PROPOFOL  N/A 04/16/2019   Procedure: ESOPHAGOGASTRODUODENOSCOPY (EGD) WITH PROPOFOL ;  Surgeon: Janalyn Keene NOVAK, MD;  Location: [facility] ENDOSCOPY;  Service: Endoscopy;  Laterality: N/A;   ESOPHAGOGASTRODUODENOSCOPY (EGD) WITH PROPOFOL  N/A 10/03/2019   Procedure: ESOPHAGOGASTRODUODENOSCOPY (EGD) WITH PROPOFOL ;  Surgeon: Janalyn Keene NOVAK, MD;  Location: [facility] ENDOSCOPY;  Service: Endoscopy;  Laterality: N/A;   EUS N/A 09/10/2020   Procedure: LOWER ENDOSCOPIC ULTRASOUND (EUS);  Surgeon: Wilhelmenia Aloha Raddle., MD;  Location: [facility] ENDOSCOPY;  Service: Gastroenterology;  Laterality: N/A;   EUS N/A 11/11/2021   Procedure: LOWER ENDOSCOPIC ULTRASOUND (EUS);  Surgeon: Wilhelmenia Aloha Raddle., MD;  Location: [facility] ENDOSCOPY;  Service: Gastroenterology;  Laterality: N/A;   FLEXIBLE SIGMOIDOSCOPY N/A 08/27/2020   Procedure: FLEXIBLE SIGMOIDOSCOPY;  Surgeon: Janalyn Keene NOVAK, MD;  Location: [facility] ENDOSCOPY;  Service: Endoscopy;  Laterality: N/A;   FLEXIBLE SIGMOIDOSCOPY N/A 09/10/2020   Procedure: FLEXIBLE SIGMOIDOSCOPY;  Surgeon: Wilhelmenia Aloha Raddle., MD;  Location: [facility] ENDOSCOPY;  Service: Gastroenterology;  Laterality: N/A;   FLEXIBLE SIGMOIDOSCOPY N/A 11/11/2021   Procedure: FLEXIBLE SIGMOIDOSCOPY;  Surgeon: Wilhelmenia Aloha Raddle., MD;  Location: [facility] ENDOSCOPY;  Service: Gastroenterology;  Laterality: N/A;   FOREIGN BODY REMOVAL  09/10/2020   Procedure: FOREIGN BODY REMOVAL ;  Surgeon: Wilhelmenia Aloha Raddle., MD;  Location: [facility]  ENDOSCOPY;  Service: Gastroenterology;;   HEMOSTASIS CLIP PLACEMENT  09/10/2020   Procedure: HEMOSTASIS CLIP PLACEMENT;  Surgeon: Wilhelmenia Aloha Raddle., MD;  Location: [facility] ENDOSCOPY;  Service: Gastroenterology;;   HYSTEROSCOPY WITH D & C N/A 03/25/2021   Procedure: DILATATION AND CURETTAGE LELDON;  Surgeon: Lake Read, MD;  Location: [facility] ORS;  Service: Gynecology;  Laterality: N/A;   IR RADIOLOGIST EVAL & MGMT  01/03/2023   IR RADIOLOGIST EVAL & MGMT  01/31/2023   POLYPECTOMY  08/28/2015   Procedure: POLYPECTOMY INTESTINAL;  Surgeon: Rogelia Copping, MD;  Location: [facility] SURGERY CNTR;  Service: Endoscopy;;  Rectal polyp   SUBMUCOSAL LIFTING INJECTION  09/10/2020   Procedure: SUBMUCOSAL LIFTING INJECTION;  Surgeon: Wilhelmenia Aloha Raddle., MD;  Location: [facility] ENDOSCOPY;  Service: Gastroenterology;;   TUBAL LIGATION Bilateral    UMBILICAL HERNIA REPAIR N/A 08/09/2016   Procedure: HERNIA REPAIR UMBILICAL ADULT;  Surgeon: Aloysius Plant, MD;  Location: [facility] ORS;  Service: General;  Laterality: N/A;   VENTRAL HERNIA REPAIR N/A 04/10/2015   Procedure: HERNIA REPAIR VENTRAL ADULT;  Surgeon: Larinda Unknown Sharps, MD;  Location: [facility] ORS;  Service: General;  Laterality: N/A;  Home Medications:  Allergies as of 02/07/2024       Reactions   Bactrim  [sulfamethoxazole -trimethoprim ] Shortness Of Breath, Nausea And Vomiting        Medication List        Accurate as of February 05, 2024  9:31 PM. If you have any questions, ask your nurse or doctor.          Accu-Chek Guide test strip Generic drug: glucose blood TEST TWICE DAILY   acetaminophen  500 MG tablet Commonly known as: TYLENOL  Take 1,000 mg by mouth every 6 (six) hours as needed for moderate pain.   albuterol  108 (90 Base) MCG/ACT inhaler Commonly known as: VENTOLIN  HFA INHALE 2 PUFFS INTO THE LUNGS EVERY 6 HOURS AS NEEDED FOR WHEEZING OR SHORTNESS OF BREATH   amLODipine  10 MG tablet Commonly known as: NORVASC  TAKE 1  TABLET(10 MG) BY MOUTH DAILY   aspirin  EC 81 MG tablet Take 81 mg by mouth daily.   atorvastatin  10 MG tablet Commonly known as: LIPITOR TAKE 1 TABLET(10 MG) BY MOUTH DAILY   irbesartan  300 MG tablet Commonly known as: AVAPRO  TAKE 1 TABLET(300 MG) BY MOUTH DAILY   metFORMIN  500 MG 24 hr tablet Commonly known as: GLUCOPHAGE -XR TAKE 1 TABLET BY MOUTH EVERY MORNING WITH BREAKFAST   methocarbamol 500 MG tablet Commonly known as: ROBAXIN Take 500 mg by mouth at bedtime.   multivitamin with minerals tablet Take 1 tablet by mouth daily.   triamterene -hydrochlorothiazide  37.5-25 MG tablet Commonly known as: MAXZIDE-25 Take 1 tablet by mouth daily.   True Metrix Level 1 Low Soln 1 each by In Vitro route daily as needed.   True Metrix Meter Devi 1 each by Does not apply route daily.        Allergies:  Allergies  Allergen Reactions   Bactrim  [Sulfamethoxazole -Trimethoprim ] Shortness Of Breath and Nausea And Vomiting    Family History: Family History  Problem Relation Age of Onset   Cancer Mother 55       Pancreatic   Cancer Father        Throat dx 51s   Healthy Sister    Cancer Sister 70       unk metastatic   Kidney cancer Sister 92   HIV Brother    Breast cancer Maternal Grandmother 79   Breast cancer Cousin        d. 72s    Social History:  reports that she has been smoking cigarettes. She started smoking about 47 years ago. She has a 23.8 pack-year smoking history. She has never been exposed to tobacco smoke. She has never used smokeless tobacco. She reports current alcohol use of about 2.0 standard drinks of alcohol per week. She reports that she does not use drugs.  ROS: Pertinent ROS in HPI  Physical Exam: There were no vitals taken for this visit.  Constitutional:  Well nourished. Alert and oriented, No acute distress. HEENT: [place] AT, moist mucus membranes.  Trachea midline, no masses. Cardiovascular: No clubbing, cyanosis, or edema. Respiratory:  Normal respiratory effort, no increased work of breathing. GU: No CVA tenderness.  No bladder fullness or masses.  Recession of labia minora, dry, pale vulvar vaginal mucosa and loss of mucosal ridges and folds.  Normal urethral meatus, no lesions, no prolapse, no discharge.   No urethral masses, tenderness and/or tenderness. No bladder fullness, tenderness or masses. *** vagina mucosa, *** estrogen effect, no discharge, no lesions, *** pelvic support, *** cystocele and *** rectocele noted.  No cervical motion  tenderness.  Uterus is freely mobile and non-fixed.  No adnexal/parametria masses or tenderness noted.  Anus and perineum are without rashes or lesions.   ***  Neurologic: Grossly intact, no focal deficits, moving all 4 extremities. Psychiatric: Normal mood and affect.    Laboratory Data: Lab Results  Component Value Date   WBC 8.4 09/13/2023   HGB 13.6 09/13/2023   HCT 41.9 09/13/2023   MCV 85.0 09/13/2023   PLT 246 09/13/2023   Lab Results  Component Value Date   CREATININE 0.84 01/08/2024   Lab Results  Component Value Date   HGBA1C 6.3 (H) 01/08/2024    Urinalysis *** I have reviewed the labs.   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   RADIATION DOSE REDUCTION: This exam was performed according to the departmental dose-optimization program which includes automated exposure control, adjustment of the mA and/or kV according to patient size and/or use of iterative reconstruction technique.   CONTRAST:  OMNIPAQUE  IOHEXOL  300 MG/ML  SOLN   COMPARISON:  December 06, 2022   FINDINGS: Lower chest: No acute abnormality.   Hepatobiliary: No focal liver abnormality is seen. Status post cholecystectomy.  No biliary dilatation.   Pancreas: Unremarkable. No pancreatic ductal dilatation or surrounding inflammatory changes.   Spleen: Normal in size without focal abnormality.   Adrenals/Urinary Tract: Adrenal glands are normal   Right kidney is normal   Left kidney status post cryoablation of the lower pole lesion. With a residual resultant area of decreased attenuation along the medial cortical region of the lower pole measuring 12 x 20 mm. No obvious enhancing masses.   No perirenal collections. No perirenal masses. Small stable fatty replaced lymph node between the aorta and the kidney measuring 9 mm.   No hydronephrosis.   Stomach/Bowel: Stomach is within normal limits. Appendix appears normal. No evidence of bowel wall thickening, distention, or inflammatory changes.   Vascular/Lymphatic: No significant vascular findings are present. No enlarged abdominal or pelvic lymph nodes.   Reproductive: Uterus and bilateral adnexa are unremarkable.   Other: No abdominal wall hernia or abnormality. No abdominopelvic ascites.   Musculoskeletal: No fracture is seen.   IMPRESSION: *Status post cryoablation of the lower pole left renal lesion. No obvious enhancing masses. No perirenal collections. *No evidence of metastatic disease. *Status post cholecystectomy.     Electronically Signed   By: Franky Chard M.D.   On: 09/20/2023 11:32 I have independently reviewed the films.  See  HPI.  Assessment & Plan:  ***  1. Left renal mass - s/p cryoablation (917975) - contrast CT (08/2023) no recurrence  2. High risk hematuria - continues to smoke, encouraged to quit -  UA ***  No follow-ups on file.  These notes generated with voice recognition software. I apologize for typographical errors.  CLOTILDA HELON RIGGERS  [facility] [address]  [address] [place], [place] [address] [phone]

## 2024-02-07 ENCOUNTER — Encounter: Payer: Self-pay | Admitting: Urology

## 2024-02-07 ENCOUNTER — Ambulatory Visit (INDEPENDENT_AMBULATORY_CARE_PROVIDER_SITE_OTHER): Admitting: Urology

## 2024-02-07 VITALS — BP 160/80 | HR 73 | Ht 68.0 in | Wt 195.0 lb

## 2024-02-07 DIAGNOSIS — R351 Nocturia: Secondary | ICD-10-CM

## 2024-02-07 DIAGNOSIS — R319 Hematuria, unspecified: Secondary | ICD-10-CM | POA: Diagnosis not present

## 2024-02-07 DIAGNOSIS — N2889 Other specified disorders of kidney and ureter: Secondary | ICD-10-CM

## 2024-02-07 LAB — URINALYSIS, COMPLETE
Bilirubin, UA: NEGATIVE
Glucose, UA: NEGATIVE
Ketones, UA: NEGATIVE
Leukocytes,UA: NEGATIVE
Nitrite, UA: NEGATIVE
Protein,UA: NEGATIVE
Specific Gravity, UA: 1.02 (ref 1.005–1.030)
Urobilinogen, Ur: 0.2 mg/dL (ref 0.2–1.0)
pH, UA: 6 (ref 5.0–7.5)

## 2024-02-07 LAB — MICROSCOPIC EXAMINATION: Epithelial Cells (non renal): >10 /HPF — AB (ref 0–10)

## 2024-02-08 ENCOUNTER — Ambulatory Visit: Admitting: Urology

## 2024-02-21 DIAGNOSIS — F17209 Nicotine dependence, unspecified, with unspecified nicotine-induced disorders: Secondary | ICD-10-CM | POA: Diagnosis not present

## 2024-02-21 DIAGNOSIS — I70219 Atherosclerosis of native arteries of extremities with intermittent claudication, unspecified extremity: Secondary | ICD-10-CM | POA: Diagnosis not present

## 2024-02-21 DIAGNOSIS — E118 Type 2 diabetes mellitus with unspecified complications: Secondary | ICD-10-CM | POA: Diagnosis not present

## 2024-02-21 DIAGNOSIS — E782 Mixed hyperlipidemia: Secondary | ICD-10-CM | POA: Diagnosis not present

## 2024-02-21 DIAGNOSIS — I700 Atherosclerosis of aorta: Secondary | ICD-10-CM | POA: Diagnosis not present

## 2024-02-21 DIAGNOSIS — I10 Essential (primary) hypertension: Secondary | ICD-10-CM | POA: Diagnosis not present

## 2024-02-21 DIAGNOSIS — I2510 Atherosclerotic heart disease of native coronary artery without angina pectoris: Secondary | ICD-10-CM | POA: Diagnosis not present

## 2024-03-19 ENCOUNTER — Other Ambulatory Visit: Payer: Self-pay | Admitting: Internal Medicine

## 2024-03-19 DIAGNOSIS — E1169 Type 2 diabetes mellitus with other specified complication: Secondary | ICD-10-CM

## 2024-03-21 NOTE — Telephone Encounter (Signed)
 Too soon for refill,LRF 01/19/24 FOR 90 AND 1 RF.   Requested Prescriptions  Pending Prescriptions Disp Refills   atorvastatin  (LIPITOR) 10 MG tablet [Pharmacy Med Name: ATORVASTATIN  10MG  TABLETS] 90 tablet 1    Sig: TAKE 1 TABLET(10 MG) BY MOUTH DAILY     Cardiovascular:  Antilipid - Statins Failed - 03/21/2024  1:52 PM      Failed - Lipid Panel in normal range within the last 12 months    Cholesterol  Date Value Ref Range Status  08/28/2023 103 0 - 200 mg/dL Final   LDL Cholesterol  Date Value Ref Range Status  08/28/2023 NOT CALCULATED 0 - 99 mg/dL Final    Comment:           Total Cholesterol/HDL:CHD Risk Coronary Heart Disease Risk Table                     Men   Women  1/2 Average Risk   3.4   3.3  Average Risk       5.0   4.4  2 X Average Risk   9.6   7.1  3 X Average Risk  23.4   11.0        Use the calculated Patient Ratio above and the CHD Risk Table to determine the patient's CHD Risk.        ATP III CLASSIFICATION (LDL):  <100     mg/dL   Optimal  899-870  mg/dL   Near or Above                    Optimal  130-159  mg/dL   Borderline  839-810  mg/dL   High  >809     mg/dL   Very High Performed at [facility], [address] [place], [place] [address]    Direct LDL  Date Value Ref Range Status  01/08/2024 51 0 - 99 mg/dL Final    Comment:    Performed at [facility], [address], [place], [place] [address]   HDL  Date Value Ref Range Status  08/28/2023 32 (L) >40 mg/dL Final   Triglycerides  Date Value Ref Range Status  08/28/2023 379 (H) <150 mg/dL Final         Passed - Patient is not pregnant      Passed - Valid encounter within last 12 months    Recent Outpatient Visits           2 months ago Essential hypertension   [facility], Leita DEL, MD   6 months ago Annual physical exam   [facility], Leita DEL, MD       Future Appointments             In 10 months [place], Clotilda DELENA RIGGERS [facility] [place]

## 2024-03-26 ENCOUNTER — Telehealth: Payer: Self-pay | Admitting: *Deleted

## 2024-03-26 NOTE — Telephone Encounter (Addendum)
 Due to hx of breast cancer, Dr. Babara recommends NP for further eval

## 2024-03-26 NOTE — Telephone Encounter (Signed)
 Patient says that she has a boil off the left breast that she had the cancer and and she says lymph nodes was removed on that area and she wants to know if she needs to know if she needs to go and have it lanced. Please call the patient what to do

## 2024-03-27 NOTE — Telephone Encounter (Signed)
 Rn spoke w/pt. Patient reports cystic boil in left axillary region. She is placing hot compresses on the area, which is very painful to touch. Pt accepted apt in [facility] apt  with FREDRIK Husband, NP.

## 2024-03-28 ENCOUNTER — Encounter: Payer: Self-pay | Admitting: Nurse Practitioner

## 2024-03-28 ENCOUNTER — Inpatient Hospital Stay: Attending: Nurse Practitioner | Admitting: Nurse Practitioner

## 2024-03-28 VITALS — BP 127/81 | HR 83 | Temp 97.1°F | Resp 16 | Wt 196.0 lb

## 2024-03-28 DIAGNOSIS — L02412 Cutaneous abscess of left axilla: Secondary | ICD-10-CM | POA: Insufficient documentation

## 2024-03-28 DIAGNOSIS — Z808 Family history of malignant neoplasm of other organs or systems: Secondary | ICD-10-CM | POA: Insufficient documentation

## 2024-03-28 DIAGNOSIS — L0291 Cutaneous abscess, unspecified: Secondary | ICD-10-CM | POA: Diagnosis not present

## 2024-03-28 DIAGNOSIS — Z853 Personal history of malignant neoplasm of breast: Secondary | ICD-10-CM | POA: Insufficient documentation

## 2024-03-28 MED ORDER — DOXYCYCLINE HYCLATE 100 MG PO TABS: 100.0000 mg | ORAL_TABLET | Freq: Two times a day (BID) | ORAL | 0 refills | Status: AC

## 2024-03-28 NOTE — Progress Notes (Signed)
 Hematology Oncology Progress Note  Chief Complaint: Donna Riley is a 63 y.o.  female with a history of stage IIA left breast cancer and rectal neuroendocrine carcinoma, osteopenia.  Presents today with concerns for a painful bump to left axilla.    ASSESSMENT & PLAN:   Skin abscess left axilla: Start doxycycline  for 7 days sent to pharmacy today.  Messaged with Dr. Desiderio regarding referral to general surgery for possible I&D.  Referral sent.  Instructed the patient to call with any worsening symptoms especially fever and chills.   No problem-specific Assessment & Plan notes found for this encounter.    Orders Placed This Encounter  Procedures   Ambulatory referral to General Surgery    Referral Priority:   Routine    Referral Type:   Surgical    Referral Reason:   Specialty Services Required    Referred to Provider:   Desiderio Schanz, MD    Requested Specialty:   General Surgery    Number of Visits Requested:   1   Doxycycline  sent to Pharmacy of choice Referral to general surgery I have messaged Dr. Desiderio as he has seen her in past Continue current f/u plan    Donna Riley [facility] Hematology Oncology 03/28/2024    PERTINENT ONCOLOGY HISTORY Patient previously followed up by Dr.Corcoran, patient switched care to me on 12/31/20 Extensive medical record review was performed by me  #stage IIA left breast cancer  08/27/2014 partial mastectomy and sentinel lymph node biopsy on 08/27/2014.  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  There was lymphovascular invasion.  One of 2 sentinel lymph nodes were positive  (focus of 2.8 mm).  Tumor was > 90% ER positive, > 90% PR positive, and Her2/neu negative.  Pathologic stage was [id number].   Bone scan on 09/16/2014 revealed abnormal focal uptake at the level of right L3 pedicle and L5 vertebral body.  Lumbar spine MRI on 09/27/2014 revealed no  evidence of metastatic disease with lower lumbar facet arthritis.  Abdominal and pelvic CT scan on 09/24/2014 revealed hepatomegaly and no evidence of metastatic disease.     Adjuvant chemotherapy  4 cycles of Taxotere  and Cytoxan  (09/29/2014 - 12/09/2014) with Neulasta  support. She received 50.4 Gy to the left breast from 01/12/2015 until 02/23/2015.    03/12/2015, started on letrozole  but switched to Arimidex  on 03/30/2015 secondary to diffuse joint aches.   Arimidex  completed around 03/13/2020.  BCI testing on 08/20/2019 revealed an estimated risk of late recurrence between years 5-10 of 10.1% (95% CI: 5.6-14.3%).  She is not likely to benefit from extended endocrine therapy.   She had chronic left nipple discharge.  Left breast lumpectomy at the 11 o'clock position on 04/05/2016 revealed an intraductal papilloma with sclerosis and calcifications. There was fat necrosis with fibrosis and calcifications. Pathology was negative for atypia and malignancy  Health maintenance Colonoscopy on 08/28/2015 revealed one 4 mm polyp in the rectum (tubular adenoma). EGD on 10/03/2019 revealed erythematous mucosa in the antrum. Pathology revealed antral and oxyntic mucosa with moderate chronic inactive gastritis. H. Pylori was negative. There was no intestinal metaplasia, dysplasia, and malignancy.   #Osteoporosis Bone density study on 09/15/2014 revealed osteopenia with a T-score of -2.1 at L1-L4.  Bone density on 01/17/2018 revealed osteoporosis with a T-score of -2.6 in the AP spine L1-L4.  Bone density on 03/03/2020 revealed osteopenia with a T score of -2.2 at the lumbar spine.  She is on calcium  and vitamin D.   #Lung cancer screening  She has a smoking history.  Low dose chest CT on 02/05/2020 revealed Lung-RADS 2S, benign appearance or behavior.  There was aortic atherosclerosis, in addition to left anterior descending coronary artery disease. There was mild diffuse bronchial wall thickening with mild  centrilobular and paraseptal emphysema suggestive of underlying COPD.  08/09/2020 colonoscopy by Dr. Janalyn showed 5 polyp in ascending colon, descending colon and sigmoid colon resected and retrieved-pathology showed tubular adenoma, multiple fragments.  Negative for high-grade dysplasia and malignancy. Nodule in the sigmoid colon, biopsied.  Clip was placed.-Biopsy showed well-differentiated neuroendocrine tumor.  Grade 1.  08/27/2020, flexible sigmoidoscopy showed mucosal nodule in the sigmoid colon, tattooed.  Nonbleeding internal hemorrhoids.    09/10/2020  Flex Sigmoidoscopy/Lower EUS by Dr. Wilhelmenia, and EMR  Pathology showed proximal rectum EMR Well differentiated neuroendocrine tumor-carcinoid.  Neuroendocrine tumor less 10.1 cm from cauterized margin. 1.2 x 1 x 0.8 cm.  Dr. Wilhelmenia felt to be a complete resection and recommend 1 year follow-up lower EUS. 09/30/2020 CT abdomen pelvis showed sutures near the rectosigmoid junction consistent with history.  Hepatic steatosis.  09/30/2020 Chromogranin A level at 49.6.  09/13/2023 mammogram with ultrasound to axilla No mammographic evidence of malignancy. 11/11/2021, patient had repeat EUS/sigmoidoscopy for follow-up of the rectal NET.  Per patient, no abnormality was found.  EUS report was not available in EMR.  INTERVAL HISTORY Donna Riley is a 63 y.o. female who has above history reviewed by me today presents for follow up for history of breast cancer as well as rectal neuroendocrine carcinoma.  She called requesting an appointment due to concerns of a painful bump underneath her left axilla.  She reports that this appeared under her left axilla this week and it is very painful to touch.  It does appear to be red, tender with palpation.  External breast exam was completed during visit with no other concerning findings.  She denies any fever or chills.  She reports having to have an abscess drained underneath her arm in the past.  She  does have a history of a partial mastectomy with sentinel node biopsy on that left side.  This abscess is not draining it is not open it is about the size of a walnut today.  I reached out to her previous surgeon Dr. Desiderio and asked if he could evaluate in the office for possible I&D.  Referral has been sent.  I have also started the patient on doxycycline  as she is allergic to Bactrim .  I instructed her to call our office with any fever chills or other concerns regarding worsening infection.  Her last mammogram was completed in April and was negative for any concerning findings.  No new complaints or new breast concerns.  Denies cough, shortness of breath, abdominal pain, blood in the stool.      Past Medical History:  Diagnosis Date   Abdominal bloating    Acute midline low back pain without sciatica 10/09/2018   Aortic atherosclerosis    Arthritis    Breast cancer, left (HCC) 08/04/2014   a.) Bx (+) for invasive mammary carcinoma with DCIS; ER/PR (+), HER2/neu (-). (+) lymphovascular invasion with 1 of 2 lymph nodes being (+). Treated with lumpectomy + adjuvant chemoradiation.   Breast wound, left, sequela 06/23/2016   Cancer of gastrointestinal tract (HCC) 08/2020   COPD (chronic obstructive pulmonary disease) (HCC)    Coronary artery disease    a.) MV  11/23/2020: no ischemia   Diastolic dysfunction 10/29/2020   a.) TTE 10/29/2020: EF >55%, no RMWAs, norm RVSF, triv AR/TR/PR, mild MR, G1DD   DOE (dyspnea on exertion)    Dyspnea    Elevated LFTs    Endometrial polyp 02/03/2021   a.) Pelvic US  --> 18 x 5 x 10 mm lenticular nodule in endrometrial canal; favored endometrial polyp vs. neoplasm. b.) Bx 02/23/2021 --> no dysplasia or malignancy; benign.   Family history of breast cancer    Family history of kidney cancer    Family history of pancreatic cancer    Family history of throat cancer    Fatigue 08/01/2016   GERD (gastroesophageal reflux disease)    occ-no meds   Gout     Helicobacter pylori gastritis 06/03/2019   Hydradenitis 01/23/2015   Hyperlipidemia    Hypertension    Left renal mass 12/06/2022   a.) CT AP 12/06/2022 --> 2.4 cm enhancing lesion of the lower pole of the LEFT kidney   Mastitis 10/15/2016   Neuroendocrine tumor (HCC) 08/14/2020   a.) Rectal mass Bx (+) for well differentiated NET (grade I). IHC for cytokeratin AE1/AE3 and CD56 supports.   Osteoporosis of lumbar spine 03/12/2015   2021 - Prolia recommended by Oncology - to start once dental issues are resolved   Polyp of ascending colon    Polyp of descending colon    Polyp of sigmoid colon    Positive PPD, treated    PVD (peripheral vascular disease) with claudication    Rectal polyp    T2DM (type 2 diabetes mellitus) (HCC) 06/2014    Past Surgical History:  Procedure Laterality Date   BIOPSY  11/11/2021   Procedure: BIOPSY;  Surgeon: Wilhelmenia Aloha Raddle., MD;  Location: [facility] ENDOSCOPY;  Service: Gastroenterology;;   BREAST BIOPSY Left 07/31/2014   IMC and DCIS    BREAST BIOPSY Left 03/08/2016   INTRADUCTAL PAPILLOMA    BREAST LUMPECTOMY Left 07/2014   IDC, DCIS, clear margins, positive LN   BREAST LUMPECTOMY Left 04/05/2016   excision of intraductal papilloma, no malignancy or atypia   BREAST LUMPECTOMY WITH NEEDLE LOCALIZATION Left 04/05/2016   Procedure: BREAST LUMPECTOMY WITH NEEDLE LOCALIZATION;  Surgeon: Dorothyann LITTIE Husk, MD;  Location: [facility] ORS;  Service: General;  Laterality: Left;   BREAST LUMPECTOMY WITH NEEDLE LOCALIZATION AND AXILLARY SENTINEL LYMPH NODE BX Left 08/27/14   CHOLECYSTECTOMY  09/07/2013   COLONOSCOPY WITH PROPOFOL  N/A 08/28/2015   Procedure: COLONOSCOPY WITH PROPOFOL ;  Surgeon: Rogelia Copping, MD;  Location: [facility]ERY [facility];  Service: Endoscopy;  Laterality: N/A;  Diabetic oral   COLONOSCOPY WITH PROPOFOL  N/A 08/14/2020   Procedure: COLONOSCOPY WITH PROPOFOL ;  Surgeon: Janalyn Keene NOVAK, MD;  Location: [facility] ENDOSCOPY;  Service: Endoscopy;   Laterality: N/A;   ENDOSCOPIC MUCOSAL RESECTION N/A 09/10/2020   Procedure: ENDOSCOPIC MUCOSAL RESECTION;  Surgeon: Wilhelmenia Aloha Raddle., MD;  Location: [facility] ENDOSCOPY;  Service: Gastroenterology;  Laterality: N/A;   ESOPHAGOGASTRODUODENOSCOPY  2014   gastritis   ESOPHAGOGASTRODUODENOSCOPY (EGD) WITH PROPOFOL  N/A 04/16/2019   Procedure: ESOPHAGOGASTRODUODENOSCOPY (EGD) WITH PROPOFOL ;  Surgeon: Janalyn Keene NOVAK, MD;  Location: [facility] ENDOSCOPY;  Service: Endoscopy;  Laterality: N/A;   ESOPHAGOGASTRODUODENOSCOPY (EGD) WITH PROPOFOL  N/A 10/03/2019   Procedure: ESOPHAGOGASTRODUODENOSCOPY (EGD) WITH PROPOFOL ;  Surgeon: Janalyn Keene NOVAK, MD;  Location: [facility] ENDOSCOPY;  Service: Endoscopy;  Laterality: N/A;   EUS N/A 09/10/2020   Procedure: LOWER ENDOSCOPIC ULTRASOUND (EUS);  Surgeon: Wilhelmenia Aloha Raddle., MD;  Location: [facility] ENDOSCOPY;  Service: Gastroenterology;  Laterality:  N/A;   EUS N/A 11/11/2021   Procedure: LOWER ENDOSCOPIC ULTRASOUND (EUS);  Surgeon: Wilhelmenia Aloha Raddle., MD;  Location: [facility] ENDOSCOPY;  Service: Gastroenterology;  Laterality: N/A;   FLEXIBLE SIGMOIDOSCOPY N/A 08/27/2020   Procedure: FLEXIBLE SIGMOIDOSCOPY;  Surgeon: Janalyn Keene NOVAK, MD;  Location: [facility] ENDOSCOPY;  Service: Endoscopy;  Laterality: N/A;   FLEXIBLE SIGMOIDOSCOPY N/A 09/10/2020   Procedure: FLEXIBLE SIGMOIDOSCOPY;  Surgeon: Wilhelmenia Aloha Raddle., MD;  Location: [facility] ENDOSCOPY;  Service: Gastroenterology;  Laterality: N/A;   FLEXIBLE SIGMOIDOSCOPY N/A 11/11/2021   Procedure: FLEXIBLE SIGMOIDOSCOPY;  Surgeon: Wilhelmenia Aloha Raddle., MD;  Location: [facility] ENDOSCOPY;  Service: Gastroenterology;  Laterality: N/A;   FOREIGN BODY REMOVAL  09/10/2020   Procedure: FOREIGN BODY REMOVAL ;  Surgeon: Wilhelmenia Aloha Raddle., MD;  Location: [facility] ENDOSCOPY;  Service: Gastroenterology;;   HEMOSTASIS CLIP PLACEMENT  09/10/2020   Procedure: HEMOSTASIS CLIP PLACEMENT;  Surgeon: Wilhelmenia Aloha Raddle., MD;  Location: [facility] ENDOSCOPY;   Service: Gastroenterology;;   HYSTEROSCOPY WITH D & C N/A 03/25/2021   Procedure: DILATATION AND CURETTAGE LELDON;  Surgeon: Lake Read, MD;  Location: [facility] ORS;  Service: Gynecology;  Laterality: N/A;   IR RADIOLOGIST EVAL & MGMT  01/03/2023   IR RADIOLOGIST EVAL & MGMT  01/31/2023   POLYPECTOMY  08/28/2015   Procedure: POLYPECTOMY INTESTINAL;  Surgeon: Rogelia Copping, MD;  Location: [facility];  Service: Endoscopy;;  Rectal polyp   SUBMUCOSAL LIFTING INJECTION  09/10/2020   Procedure: SUBMUCOSAL LIFTING INJECTION;  Surgeon: Wilhelmenia Aloha Raddle., MD;  Location: [facility] ENDOSCOPY;  Service: Gastroenterology;;   TUBAL LIGATION Bilateral    UMBILICAL HERNIA REPAIR N/A 08/09/2016   Procedure: HERNIA REPAIR UMBILICAL ADULT;  Surgeon: Aloysius Plant, MD;  Location: [facility] ORS;  Service: General;  Laterality: N/A;   VENTRAL HERNIA REPAIR N/A 04/10/2015   Procedure: HERNIA REPAIR VENTRAL ADULT;  Surgeon: Larinda Unknown Sharps, MD;  Location: [facility] ORS;  Service: General;  Laterality: N/A;    Family History  Problem Relation Age of Onset   Cancer Mother 70       Pancreatic   Cancer Father        Throat dx 56s   Healthy Sister    Cancer Sister 18       unk metastatic   Kidney cancer Sister 66   HIV Brother    Breast cancer Maternal Grandmother 19   Breast cancer Cousin        d. 21s    Social History:  reports that she has been smoking cigarettes. She started smoking about 47 years ago. She has a 23.9 pack-year smoking history. She has never been exposed to tobacco smoke. She has never used smokeless tobacco. She reports current alcohol use of about 2.0 standard drinks of alcohol per week. She reports that she does not use drugs.   Allergies:  Allergies  Allergen Reactions   Bactrim  [Sulfamethoxazole -Trimethoprim ] Shortness Of Breath and Nausea And Vomiting    Current Medications: Current Outpatient Medications  Medication Sig Dispense Refill   acetaminophen  (TYLENOL ) 500 MG  tablet Take 1,000 mg by mouth every 6 (six) hours as needed for moderate pain.     albuterol  (VENTOLIN  HFA) 108 (90 Base) MCG/ACT inhaler INHALE 2 PUFFS INTO THE LUNGS EVERY 6 HOURS AS NEEDED FOR WHEEZING OR SHORTNESS OF BREATH 6.7 g 0   amLODipine  (NORVASC ) 10 MG tablet TAKE 1 TABLET(10 MG) BY MOUTH DAILY 90 tablet 0   aspirin  EC 81 MG tablet Take 81 mg by mouth daily.  atorvastatin  (LIPITOR) 10 MG tablet TAKE 1 TABLET(10 MG) BY MOUTH DAILY 90 tablet 1   Blood Glucose Calibration (TRUE METRIX LEVEL 1) Low SOLN 1 each by In Vitro route daily as needed. 1 each 3   Blood Glucose Monitoring Suppl (TRUE METRIX METER) DEVI 1 each by Does not apply route daily. 100 Device 3   doxycycline  (VIBRA -TABS) 100 MG tablet Take 1 tablet (100 mg total) by mouth 2 (two) times daily for 7 days. 14 tablet 0   glucose blood (ACCU-CHEK GUIDE) test strip TEST TWICE DAILY 100 strip 12   irbesartan  (AVAPRO ) 300 MG tablet TAKE 1 TABLET(300 MG) BY MOUTH DAILY 90 tablet 0   metFORMIN  (GLUCOPHAGE -XR) 500 MG 24 hr tablet TAKE 1 TABLET BY MOUTH EVERY MORNING WITH BREAKFAST 90 tablet 1   methocarbamol (ROBAXIN) 500 MG tablet Take 500 mg by mouth at bedtime.     Multiple Vitamins-Minerals (MULTIVITAMIN WITH MINERALS) tablet Take 1 tablet by mouth daily.     triamterene -hydrochlorothiazide  (MAXZIDE-25) 37.5-25 MG tablet Take 1 tablet by mouth daily. 90 tablet 1   No current facility-administered medications for this visit.   Review of Systems  Constitutional:  Negative for appetite change, chills, fatigue and fever.  HENT:   Negative for hearing loss and voice change.   Eyes:  Negative for eye problems.  Respiratory:  Negative for chest tightness and cough.   Cardiovascular:  Negative for chest pain.  Gastrointestinal:  Negative for abdominal distention, abdominal pain and blood in stool.  Endocrine: Negative for hot flashes.  Genitourinary:  Negative for difficulty urinating and frequency.   Musculoskeletal:  Negative  for arthralgias.  Skin:  Negative for itching and rash.       Painful lump to left axilla   Neurological:  Negative for extremity weakness.  Hematological:  Negative for adenopathy.  Psychiatric/Behavioral:  Negative for confusion.       Vitals Blood pressure 127/81, pulse 83, temperature (!) 97.1 F (36.2 C), temperature source Tympanic, resp. rate 16, weight 196 lb (88.9 kg), SpO2 98%.  Performance status (ECOG): 1 Physical Exam Vitals and nursing note reviewed.  Constitutional:      General: She is not in acute distress.    Appearance: She is well-developed. She is not diaphoretic.  Eyes:     General: No scleral icterus.    Conjunctiva/sclera: Conjunctivae normal.  Cardiovascular:     Rate and Rhythm: Normal rate and regular rhythm.  Pulmonary:     Effort: Pulmonary effort is normal.     Breath sounds: Normal breath sounds.  Abdominal:     General: Abdomen is flat.  Skin:    General: Skin is warm and dry.     Comments: Walnut sized abscess, not draining, painful to touch, swollen, red left axilla   Neurological:     Mental Status: She is alert and oriented to person, place, and time.  Psychiatric:        Mood and Affect: Mood normal.   Declined breast exam.   RADIOGRAPHIC STUDIES: I have personally reviewed the radiological images as listed and agreed with the findings in the report. US  Venous Img Lower Unilateral Left (DVT) Result Date: 01/05/2024 CLINICAL DATA:  Left lower extremity edema EXAM: LEFT LOWER EXTREMITY VENOUS DOPPLER ULTRASOUND TECHNIQUE: Gray-scale sonography with compression, as well as color and duplex ultrasound, were performed to evaluate the deep venous system(s) from the level of the common femoral vein through the popliteal and proximal calf veins. COMPARISON:  None Available. FINDINGS: VENOUS  Normal compressibility of the common femoral, superficial femoral, and popliteal veins, as well as the visualized calf veins. Visualized portions of profunda  femoral vein and great saphenous vein unremarkable. No filling defects to suggest DVT on grayscale or color Doppler imaging. Doppler waveforms show normal direction of venous flow, normal respiratory plasticity and response to augmentation. Limited views of the contralateral common femoral vein are unremarkable. OTHER None. Limitations: none IMPRESSION: Negative. Electronically Signed   By: Wilkie Lent M.D.   On: 01/05/2024 09:33     Labs are reviewed and discussed with patient.    Latest Ref Rng & Units 09/13/2023    9:56 AM 08/28/2023   11:34 AM 01/11/2023   10:24 AM  CBC  WBC 4.0 - 10.5 K/uL 8.4  9.1  7.5   Hemoglobin 12.0 - 15.0 g/dL 86.3  86.8  86.6   Hematocrit 36.0 - 46.0 % 41.9  40.0  42.8   Platelets 150 - 400 K/uL 246  257  247       Latest Ref Rng & Units 01/08/2024   10:45 AM 09/13/2023    9:56 AM 08/28/2023   11:34 AM  CMP  Glucose 70 - 99 mg/dL 876  866  867   BUN 8 - 23 mg/dL 16  22  19    Creatinine 0.44 - 1.00 mg/dL 9.15  8.79  8.99   Sodium 135 - 145 mmol/L 137  134  139   Potassium 3.5 - 5.1 mmol/L 3.5  3.4  3.3   Chloride 98 - 111 mmol/L 100  102  101   CO2 22 - 32 mmol/L 25  20  27    Calcium  8.9 - 10.3 mg/dL 9.4  9.1  9.7   Total Protein 6.5 - 8.1 g/dL  7.8  8.0   Total Bilirubin 0.0 - 1.2 mg/dL  0.8  0.6   Alkaline Phos 38 - 126 U/L  89  93   AST 15 - 41 U/L  20  19   ALT 0 - 44 U/L  20  21

## 2024-04-08 LAB — OPHTHALMOLOGY REPORT-SCANNED

## 2024-04-15 ENCOUNTER — Encounter: Payer: Self-pay | Admitting: Surgery

## 2024-04-15 ENCOUNTER — Ambulatory Visit: Admitting: Surgery

## 2024-04-15 VITALS — BP 102/63 | HR 89 | Ht 68.0 in | Wt 193.0 lb

## 2024-04-15 DIAGNOSIS — L02412 Cutaneous abscess of left axilla: Secondary | ICD-10-CM | POA: Diagnosis not present

## 2024-04-15 DIAGNOSIS — L02419 Cutaneous abscess of limb, unspecified: Secondary | ICD-10-CM

## 2024-04-15 NOTE — Patient Instructions (Signed)
 Follow-up with our office as needed. Call us  if this area starts to get infected again.   Please call and ask to speak with a nurse if you develop questions or concerns.   Skin Abscess  A skin abscess is an infected spot of skin. It can have pus in it. An abscess can happen in any part of your body. Some abscesses break open (rupture) on their own. Most keep getting worse unless they are treated. If your abscess is not treated, the infection can spread deeper into your body and blood. This can make you feel sick. What are the causes? Germs that enter your skin. This may happen if you have: A cut or scrape. A wound from a needle or an insect bite. Blocked oil or sweat glands. A problem with the spot where your hair goes into your skin. A fluid-filled sac called a cyst under your skin. What increases the risk? Having problems with how your blood moves through your body. Having a weak body defense system (immune system). Having diabetes. Having dry and irritated skin. Needing to get shots often. Putting drugs into your body with a needle. Having a splinter or something else in your skin. Smoking. What are the signs or symptoms? A firm bump under your skin that hurts. A bump with pus at the top. Redness and swelling. Warm or tender spots. A sore on the skin. How is this treated? You may need to: Put a heat pack or a warm, wet washcloth on the spot. Have the pus drained. Take antibiotics. Follow these instructions at home: Medicines Take over-the-counter and prescription medicines only as told by your doctor. If you were prescribed antibiotics, take them as told by your doctor. Do not stop taking them even if you start to feel better. Abscess care  If you have an abscess that has not drained, put heat on it. Use the heat source that your doctor recommends, such as a moist heat pack or a heating pad. Place a towel between your skin and the heat source. Leave the heat on for  20-30 minutes. If your skin turns bright red, take off the heat right away to prevent burns. The risk of burns is higher if you cannot feel pain, heat, or cold. Follow instructions from your doctor about how to take care of your abscess. Make sure you: Cover the abscess with a bandage. Wash your hands with soap and water for at least 20 seconds before and after you change your bandage. If you cannot use soap and water, use hand sanitizer. Change your bandage as told by your doctor. Check your abscess every day for signs that the infection is getting worse. Check for: More redness, swelling, or pain. More fluid or blood. Warmth. More pus or a worse smell. General instructions To keep the infection from spreading: Do not share personal items or towels. Do not go in a hot tub with others. Avoid making skin contact with others. Be careful when you get rid of used bandages or any pus from the abscess. Do not smoke or use any products that contain nicotine  or tobacco. If you need help quitting, ask your doctor. Contact a doctor if: You see red streaks on your skin near the abscess. You have any signs of worse infection. You vomit every time you eat or drink. You have a fever, chills, or muscle aches. The cyst or abscess comes back. Get help right away if: You have very bad pain. You make less pee (  urine) than normal. This information is not intended to replace advice given to you by your health care provider. Make sure you discuss any questions you have with your health care provider. Document Revised: 12/29/2021 Document Reviewed: 12/29/2021 Elsevier Patient Education  2024 [employer].

## 2024-04-15 NOTE — Progress Notes (Signed)
 04/15/2024  Reason for Visit: Left axillary abscess  Requesting Provider: Morna Husband, NP  History of Present Illness: Donna Riley is a 63 y.o. female presenting for evaluation of left axillary abscess.  The patient reports she has a history of this happening many years ago which required incision and drainage.  She reports that about a month ago she noticed a bump Coming back again in the left axilla and started becoming more tender and painful.  She saw Ms. Perkins with the oncology team on 03/28/2024 and was started on doxycycline  for 7-day course.  The patient reports that this area drained on its own and now is much smaller and there is no more pain.  She does notice a small residual knot but denies any pain or drainage.  She reports having had a cyst also in the groin area that also required attention many years ago.  However recently she has not had any issues except with the left axilla.  Of note, she has a prior history of left breast cancer and is status post lumpectomy and sentinel lymph node biopsy in 2016 and also history of rectal neuroendocrine carcinoma.  Her last mammogram was on 09/13/2023 which did not show any suspicious findings.  Past Medical History: Past Medical History:  Diagnosis Date   Abdominal bloating    Acute midline low back pain without sciatica 10/09/2018   Aortic atherosclerosis    Arthritis    Breast cancer, left (HCC) 08/04/2014   a.) Bx (+) for invasive mammary carcinoma with DCIS; ER/PR (+), HER2/neu (-). (+) lymphovascular invasion with 1 of 2 lymph nodes being (+). Treated with lumpectomy + adjuvant chemoradiation.   Breast wound, left, sequela 06/23/2016   Cancer of gastrointestinal tract (HCC) 08/2020   COPD (chronic obstructive pulmonary disease) (HCC)    Coronary artery disease    a.) MV 11/23/2020: no ischemia   Diastolic dysfunction 10/29/2020   a.) TTE 10/29/2020: EF >55%, no RMWAs, norm RVSF, triv AR/TR/PR, mild MR, G1DD   DOE  (dyspnea on exertion)    Dyspnea    Elevated LFTs    Endometrial polyp 02/03/2021   a.) Pelvic US  --> 18 x 5 x 10 mm lenticular nodule in endrometrial canal; favored endometrial polyp vs. neoplasm. b.) Bx 02/23/2021 --> no dysplasia or malignancy; benign.   Family history of breast cancer    Family history of kidney cancer    Family history of pancreatic cancer    Family history of throat cancer    Fatigue 08/01/2016   GERD (gastroesophageal reflux disease)    occ-no meds   Gout    Helicobacter pylori gastritis 06/03/2019   Hydradenitis 01/23/2015   Hyperlipidemia    Hypertension    Left renal mass 12/06/2022   a.) CT AP 12/06/2022 --> 2.4 cm enhancing lesion of the lower pole of the LEFT kidney   Mastitis 10/15/2016   Neuroendocrine tumor (HCC) 08/14/2020   a.) Rectal mass Bx (+) for well differentiated NET (grade I). IHC for cytokeratin AE1/AE3 and CD56 supports.   Osteoporosis of lumbar spine 03/12/2015   2021 - Prolia recommended by Oncology - to start once dental issues are resolved   Polyp of ascending colon    Polyp of descending colon    Polyp of sigmoid colon    Positive PPD, treated    PVD (peripheral vascular disease) with claudication    Rectal polyp    T2DM (type 2 diabetes mellitus) (HCC) 06/2014     Past Surgical History:  Past Surgical History:  Procedure Laterality Date   BIOPSY  11/11/2021   Procedure: BIOPSY;  Surgeon: Wilhelmenia Aloha Raddle., MD;  Location: [facility] ENDOSCOPY;  Service: Gastroenterology;;   BREAST BIOPSY Left 07/31/2014   IMC and DCIS    BREAST BIOPSY Left 03/08/2016   INTRADUCTAL PAPILLOMA    BREAST LUMPECTOMY Left 07/2014   IDC, DCIS, clear margins, positive LN   BREAST LUMPECTOMY Left 04/05/2016   excision of intraductal papilloma, no malignancy or atypia   BREAST LUMPECTOMY WITH NEEDLE LOCALIZATION Left 04/05/2016   Procedure: BREAST LUMPECTOMY WITH NEEDLE LOCALIZATION;  Surgeon: Dorothyann LITTIE Husk, MD;  Location: [facility] ORS;  Service:  General;  Laterality: Left;   BREAST LUMPECTOMY WITH NEEDLE LOCALIZATION AND AXILLARY SENTINEL LYMPH NODE BX Left 08/27/14   CHOLECYSTECTOMY  09/07/2013   COLONOSCOPY WITH PROPOFOL  N/A 08/28/2015   Procedure: COLONOSCOPY WITH PROPOFOL ;  Surgeon: Rogelia Copping, MD;  Location: [facility] SURGERY [facility]TR;  Service: Endoscopy;  Laterality: N/A;  Diabetic oral   COLONOSCOPY WITH PROPOFOL  N/A 08/14/2020   Procedure: COLONOSCOPY WITH PROPOFOL ;  Surgeon: Janalyn Keene NOVAK, MD;  Location: [facility] ENDOSCOPY;  Service: Endoscopy;  Laterality: N/A;   ENDOSCOPIC MUCOSAL RESECTION N/A 09/10/2020   Procedure: ENDOSCOPIC MUCOSAL RESECTION;  Surgeon: Wilhelmenia Aloha Raddle., MD;  Location: [facility] ENDOSCOPY;  Service: Gastroenterology;  Laterality: N/A;   ESOPHAGOGASTRODUODENOSCOPY  2014   gastritis   ESOPHAGOGASTRODUODENOSCOPY (EGD) WITH PROPOFOL  N/A 04/16/2019   Procedure: ESOPHAGOGASTRODUODENOSCOPY (EGD) WITH PROPOFOL ;  Surgeon: Janalyn Keene NOVAK, MD;  Location: [facility] ENDOSCOPY;  Service: Endoscopy;  Laterality: N/A;   ESOPHAGOGASTRODUODENOSCOPY (EGD) WITH PROPOFOL  N/A 10/03/2019   Procedure: ESOPHAGOGASTRODUODENOSCOPY (EGD) WITH PROPOFOL ;  Surgeon: Janalyn Keene NOVAK, MD;  Location: [facility] ENDOSCOPY;  Service: Endoscopy;  Laterality: N/A;   EUS N/A 09/10/2020   Procedure: LOWER ENDOSCOPIC ULTRASOUND (EUS);  Surgeon: Wilhelmenia Aloha Raddle., MD;  Location: [facility] ENDOSCOPY;  Service: Gastroenterology;  Laterality: N/A;   EUS N/A 11/11/2021   Procedure: LOWER ENDOSCOPIC ULTRASOUND (EUS);  Surgeon: Wilhelmenia Aloha Raddle., MD;  Location: [facility] ENDOSCOPY;  Service: Gastroenterology;  Laterality: N/A;   FLEXIBLE SIGMOIDOSCOPY N/A 08/27/2020   Procedure: FLEXIBLE SIGMOIDOSCOPY;  Surgeon: Janalyn Keene NOVAK, MD;  Location: [facility] ENDOSCOPY;  Service: Endoscopy;  Laterality: N/A;   FLEXIBLE SIGMOIDOSCOPY N/A 09/10/2020   Procedure: FLEXIBLE SIGMOIDOSCOPY;  Surgeon: Wilhelmenia Aloha Raddle., MD;  Location: [facility] ENDOSCOPY;  Service:  Gastroenterology;  Laterality: N/A;   FLEXIBLE SIGMOIDOSCOPY N/A 11/11/2021   Procedure: FLEXIBLE SIGMOIDOSCOPY;  Surgeon: Wilhelmenia Aloha Raddle., MD;  Location: [facility] ENDOSCOPY;  Service: Gastroenterology;  Laterality: N/A;   FOREIGN BODY REMOVAL  09/10/2020   Procedure: FOREIGN BODY REMOVAL ;  Surgeon: Wilhelmenia Aloha Raddle., MD;  Location: [facility] ENDOSCOPY;  Service: Gastroenterology;;   HEMOSTASIS CLIP PLACEMENT  09/10/2020   Procedure: HEMOSTASIS CLIP PLACEMENT;  Surgeon: Wilhelmenia Aloha Raddle., MD;  Location: [facility] ENDOSCOPY;  Service: Gastroenterology;;   HYSTEROSCOPY WITH D & C N/A 03/25/2021   Procedure: DILATATION AND CURETTAGE LELDON;  Surgeon: Lake Read, MD;  Location: [facility] ORS;  Service: Gynecology;  Laterality: N/A;   IR RADIOLOGIST EVAL & MGMT  01/03/2023   IR RADIOLOGIST EVAL & MGMT  01/31/2023   POLYPECTOMY  08/28/2015   Procedure: POLYPECTOMY INTESTINAL;  Surgeon: Rogelia Copping, MD;  Location: [facility];  Service: Endoscopy;;  Rectal polyp   SUBMUCOSAL LIFTING INJECTION  09/10/2020   Procedure: SUBMUCOSAL LIFTING INJECTION;  Surgeon: Wilhelmenia Aloha Raddle., MD;  Location: [facility] ENDOSCOPY;  Service: Gastroenterology;;   TUBAL LIGATION Bilateral    UMBILICAL HERNIA REPAIR N/A  08/09/2016   Procedure: HERNIA REPAIR UMBILICAL ADULT;  Surgeon: Aloysius Plant, MD;  Location: [facility] ORS;  Service: General;  Laterality: N/A;   VENTRAL HERNIA REPAIR N/A 04/10/2015   Procedure: HERNIA REPAIR VENTRAL ADULT;  Surgeon: Larinda Unknown Sharps, MD;  Location: [facility] ORS;  Service: General;  Laterality: N/A;    Home Medications: Prior to Admission medications   Medication Sig Start Date End Date Taking? Authorizing Provider  acetaminophen  (TYLENOL ) 500 MG tablet Take 1,000 mg by mouth every 6 (six) hours as needed for moderate pain.   Yes [provider]  albuterol  (VENTOLIN  HFA) 108 (90 Base) MCG/ACT inhaler INHALE 2 PUFFS INTO THE LUNGS EVERY 6 HOURS AS NEEDED FOR WHEEZING OR  SHORTNESS OF BREATH 11/29/23  Yes Justus Leita DEL, MD  amLODipine  (NORVASC ) 10 MG tablet TAKE 1 TABLET(10 MG) BY MOUTH DAILY 10/25/23  Yes Justus Leita DEL, MD  aspirin  EC 81 MG tablet Take 81 mg by mouth daily.   Yes [provider]  atorvastatin  (LIPITOR) 10 MG tablet TAKE 1 TABLET(10 MG) BY MOUTH DAILY 01/19/24  Yes Berglund, Laura H, MD  Blood Glucose Calibration (TRUE METRIX LEVEL 1) Low SOLN 1 each by In Vitro route daily as needed. 11/22/17  Yes Justus Leita DEL, MD  Blood Glucose Monitoring Suppl (TRUE METRIX METER) DEVI 1 each by Does not apply route daily. 12/15/17  Yes Justus Leita DEL, MD  glucose blood (ACCU-CHEK GUIDE) test strip TEST TWICE DAILY 01/25/23  Yes Justus Leita DEL, MD  irbesartan  (AVAPRO ) 300 MG tablet TAKE 1 TABLET(300 MG) BY MOUTH DAILY 10/25/23  Yes Justus Leita DEL, MD  metFORMIN  (GLUCOPHAGE -XR) 500 MG 24 hr tablet TAKE 1 TABLET BY MOUTH EVERY MORNING WITH BREAKFAST 10/25/23  Yes Berglund, Laura H, MD  methocarbamol (ROBAXIN) 500 MG tablet Take 500 mg by mouth at bedtime. 11/12/21  Yes Darr, Lang, PA-C  Multiple Vitamins-Minerals (MULTIVITAMIN WITH MINERALS) tablet Take 1 tablet by mouth daily.   Yes [provider]  triamterene -hydrochlorothiazide  (MAXZIDE-25) 37.5-25 MG tablet Take 1 tablet by mouth daily. 01/08/24  Yes Justus Leita DEL, MD    Allergies: Allergies  Allergen Reactions   Bactrim  [Sulfamethoxazole -Trimethoprim ] Shortness Of Breath and Nausea And Vomiting    Social History:  reports that she has been smoking cigarettes. She started smoking about 47 years ago. She has a 23.9 pack-year smoking history. She has never been exposed to tobacco smoke. She has never used smokeless tobacco. She reports current alcohol use of about 2.0 standard drinks of alcohol per week. She reports that she does not use drugs.   Family History: Family History  Problem Relation Age of Onset   Cancer Mother 57       Pancreatic   Cancer Father         Throat dx 71s   Healthy Sister    Cancer Sister 84       unk metastatic   Kidney cancer Sister 76   HIV Brother    Breast cancer Maternal Grandmother 20   Breast cancer Cousin        d. 73s    Review of Systems: Review of Systems  Constitutional:  Negative for chills and fever.  Respiratory:  Negative for shortness of breath.   Cardiovascular:  Negative for chest pain.  Gastrointestinal:  Negative for nausea and vomiting.  Skin:        Left axillary abscess with drainage    Physical Exam BP 102/63   Pulse 89   Ht 5'  8 (1.727 m)   Wt 193 lb (87.5 kg)   SpO2 98%   BMI 29.35 kg/m  CONSTITUTIONAL: No acute distress HEENT:  Normocephalic, atraumatic, extraocular motion intact. RESPIRATORY:  Normal respiratory effort without pathologic use of accessory muscles. CARDIOVASCULAR: Regular rhythm and rate SKIN: Left axilla has a small 1 cm nodule at the surface of the skin consistent with a prior area of abscess or infection.  Very minimal remaining fluctuance and there is really no significant induration or erythema.  It is not tender to palpation.  No active drainage. NEUROLOGIC:  Motor and sensation is grossly normal.  Cranial nerves are grossly intact. PSYCH:  Alert and oriented to person, place and time. Affect is normal.  Laboratory Analysis: No results found for this or any previous visit (from the past 24 hours).  Imaging: No results found.  Assessment and Plan: This is a 63 y.o. female with a left axillary abscess.  - The patient responded well to antibiotics and the area is much smaller without any tenderness with only minimal residual fluctuance.  Discussed with patient at this point that we could either proceed with scheduling her for an office procedure to excise that cyst to prevent further recurrences.  The patient does not seem very interested in any surgical management at this point and discussed with her that 1 option would be continued watchful waiting and  monitoring of the area.  She would rather do this instead. - Discussed with the patient that if any worsening issues with the cyst now that she has completed antibiotics, if anything recurs, drainage, fevers, chills, she can always reach out to us  so we can see her again. -- Follow up as needed.  I spent 20 minutes dedicated to the care of this patient on the date of this encounter to include pre-visit review of records, face-to-face time with the patient discussing diagnosis and management, and any post-visit coordination of care.   Aloysius Sheree Plant, MD [facility]

## 2024-04-23 ENCOUNTER — Other Ambulatory Visit: Payer: Self-pay | Admitting: Internal Medicine

## 2024-04-23 DIAGNOSIS — I10 Essential (primary) hypertension: Secondary | ICD-10-CM

## 2024-04-24 NOTE — Telephone Encounter (Signed)
 Requested Prescriptions  Pending Prescriptions Disp Refills   irbesartan  (AVAPRO ) 300 MG tablet [Pharmacy Med Name: IRBESARTAN  300MG  TABLETS] 90 tablet 0    Sig: TAKE 1 TABLET(300 MG) BY MOUTH DAILY     Cardiovascular:  Angiotensin Receptor Blockers Passed - 04/24/2024 11:01 AM      Passed - Cr in normal range and within 180 days    Creatinine  Date Value Ref Range Status  09/13/2023 1.20 (H) 0.44 - 1.00 mg/dL Final  95/87/7983 9.32 mg/dL Final    Comment:    9.55-8.99 NOTE: New Reference Range  08/05/14    Creatinine, Ser  Date Value Ref Range Status  01/08/2024 0.84 0.44 - 1.00 mg/dL Final         Passed - K in normal range and within 180 days    Potassium  Date Value Ref Range Status  01/08/2024 3.5 3.5 - 5.1 mmol/L Final  09/09/2014 4.3 mmol/L Final    Comment:    3.5-5.1 NOTE: New Reference Range  08/05/14          Passed - Patient is not pregnant      Passed - Last BP in normal range    BP Readings from Last 1 Encounters:  04/15/24 102/63         Passed - Valid encounter within last 6 months    Recent Outpatient Visits           3 months ago Essential hypertension   [place] [facility] at [facility], Leita DEL, MD   8 months ago Annual physical exam   [facility] at [facility], Leita DEL, MD       Future Appointments             In 9 months McGowan, Clotilda LABOR, PA-C [facility] [place]             amLODipine  (NORVASC ) 10 MG tablet [Pharmacy Med Name: AMLODIPINE  BESYLATE 10MG TABLETS] 90 tablet 0    Sig: TAKE 1 TABLET(10 MG) BY MOUTH DAILY     Cardiovascular: Calcium  Channel Blockers 2 Passed - 04/24/2024 11:01 AM      Passed - Last BP in normal range    BP Readings from Last 1 Encounters:  04/15/24 102/63         Passed - Last Heart Rate in normal range    Pulse Readings from Last 1 Encounters:  04/15/24 89         Passed - Valid encounter within  last 6 months    Recent Outpatient Visits           3 months ago Essential hypertension   [place] [facility] at [facility], Leita DEL, MD   8 months ago Annual physical exam   [facility] at [facility], Leita DEL, MD       Future Appointments             In 9 months McGowan, Clotilda LABOR [facility] [place]

## 2024-05-13 ENCOUNTER — Ambulatory Visit (INDEPENDENT_AMBULATORY_CARE_PROVIDER_SITE_OTHER): Admitting: Internal Medicine

## 2024-05-13 ENCOUNTER — Encounter: Payer: Self-pay | Admitting: Internal Medicine

## 2024-05-13 VITALS — BP 120/68 | HR 109 | Ht 68.0 in | Wt 197.0 lb

## 2024-05-13 DIAGNOSIS — Z23 Encounter for immunization: Secondary | ICD-10-CM | POA: Diagnosis not present

## 2024-05-13 DIAGNOSIS — E118 Type 2 diabetes mellitus with unspecified complications: Secondary | ICD-10-CM | POA: Diagnosis not present

## 2024-05-13 DIAGNOSIS — Z7984 Long term (current) use of oral hypoglycemic drugs: Secondary | ICD-10-CM

## 2024-05-13 DIAGNOSIS — I10 Essential (primary) hypertension: Secondary | ICD-10-CM

## 2024-05-13 LAB — POCT GLYCOSYLATED HEMOGLOBIN (HGB A1C): Hemoglobin A1C: 6.4 % — AB (ref 4.0–5.6)

## 2024-05-13 NOTE — Assessment & Plan Note (Addendum)
 Currently medications are MTF.  No hypoglycemic episodes noted. Home blood sugars in the 125 range. Last visit medical regimen changes were none. Lab Results  Component Value Date   HGBA1C 6.3 (H) 01/08/2024  A1C today = 6.4.  continue the same medications.

## 2024-05-13 NOTE — Progress Notes (Signed)
 Date:  05/13/2024   Name:  Donna Riley   DOB:  06-10-1960   MRN:  [id number]   Chief Complaint: Diabetes and Hypertension  Diabetes She presents for her follow-up diabetic visit. She has type 2 diabetes mellitus. Pertinent negatives for hypoglycemia include no dizziness or headaches. Pertinent negatives for diabetes include no chest pain, no fatigue and no weakness. Current diabetic treatment includes oral agent (monotherapy) (MTF).  Hypertension This is a chronic problem. The problem is controlled. Pertinent negatives include no chest pain, headaches, palpitations or shortness of breath.    Review of Systems  Constitutional:  Negative for fatigue and unexpected weight change.  HENT:  Negative for trouble swallowing.   Eyes:  Negative for visual disturbance.  Respiratory:  Negative for cough, chest tightness, shortness of breath and wheezing.   Cardiovascular:  Negative for chest pain, palpitations and leg swelling.  Gastrointestinal:  Negative for abdominal pain, constipation and diarrhea.  Musculoskeletal:  Negative for arthralgias and myalgias.  Neurological:  Negative for dizziness, weakness, light-headedness and headaches.     Lab Results  Component Value Date   NA 137 01/08/2024   K 3.5 01/08/2024   CO2 25 01/08/2024   GLUCOSE 123 (H) 01/08/2024   BUN 16 01/08/2024   CREATININE 0.84 01/08/2024   CALCIUM  9.4 01/08/2024   GFRNONAA >60 01/08/2024   Lab Results  Component Value Date   CHOL 103 08/28/2023   HDL 32 (L) 08/28/2023   LDLCALC NOT CALCULATED 08/28/2023   LDLDIRECT 51 01/08/2024   TRIG 379 (H) 08/28/2023   CHOLHDL 3.2 08/28/2023   Lab Results  Component Value Date   TSH 1.801 08/28/2023   Lab Results  Component Value Date   HGBA1C 6.4 (A) 05/13/2024   Lab Results  Component Value Date   WBC 8.4 09/13/2023   HGB 13.6 09/13/2023   HCT 41.9 09/13/2023   MCV 85.0 09/13/2023   PLT 246 09/13/2023   Lab Results  Component Value Date   ALT 20  09/13/2023   AST 20 09/13/2023   ALKPHOS 89 09/13/2023   BILITOT 0.8 09/13/2023   No results found for: MARIEN BOLLS, VD25OH   Patient Active Problem List   Diagnosis Date Noted   History of renal cell cancer 12/19/2022   History of breast cancer 11/07/2022   History of rectal cancer 11/07/2022   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   Lung nodule 03/07/2022   Centrilobular emphysema (HCC) 02/17/2022   Genetic testing 06/10/2021   Endometrial polyp    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)    History of rectal polyps    Nodule of colon    Sebaceous cyst 08/11/2020   Osteopenia 07/19/2020   Breast pain, right 07/03/2020   Atherosclerosis of abdominal aorta 02/06/2020   Tobacco use disorder, continuous 01/14/2020   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   Lumbosacral spondylosis without myelopathy 11/22/2018   Vertigo 10/09/2018   Hyperlipidemia associated with type 2 diabetes mellitus (HCC) 04/11/2018   Gastroparesis 04/11/2018   Long term (current) use of aromatase inhibitors 12/25/2017   Arthritis    Elevated LFTs    Essential hypertension    Positive PPD, treated    Irritable bowel syndrome with both constipation and diarrhea 07/20/2015   Type II diabetes mellitus with complication (HCC) 06/22/2015   Pre-ulcerative calluses 06/22/2015   Neuropathy 06/22/2015   Hot flash, menopausal 04/09/2015   Atherosclerotic peripheral vascular disease  with intermittent claudication 03/25/2015   Malignant neoplasm of left breast in female, estrogen receptor positive (HCC) 05/30/2014   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    Allergies[1]  Past Surgical History:  Procedure Laterality Date   BIOPSY  11/11/2021   Procedure: BIOPSY;  Surgeon: Wilhelmenia Aloha Raddle., MD;  Location: [facility] ENDOSCOPY;  Service: Gastroenterology;;   BREAST BIOPSY Left 07/31/2014   IMC and DCIS     BREAST BIOPSY Left 03/08/2016   INTRADUCTAL PAPILLOMA    BREAST LUMPECTOMY Left 07/2014   IDC, DCIS, clear margins, positive LN   BREAST LUMPECTOMY Left 04/05/2016   excision of intraductal papilloma, no malignancy or atypia   BREAST LUMPECTOMY WITH NEEDLE LOCALIZATION Left 04/05/2016   Procedure: BREAST LUMPECTOMY WITH NEEDLE LOCALIZATION;  Surgeon: Dorothyann LITTIE Husk, MD;  Location: [facility] ORS;  Service: General;  Laterality: Left;   BREAST LUMPECTOMY WITH NEEDLE LOCALIZATION AND AXILLARY SENTINEL LYMPH NODE BX Left 08/27/14   CHOLECYSTECTOMY  09/07/2013   COLONOSCOPY WITH PROPOFOL  N/A 08/28/2015   Procedure: COLONOSCOPY WITH PROPOFOL ;  Surgeon: Rogelia Copping, MD;  Location: [facility];  Service: Endoscopy;  Laterality: N/A;  Diabetic oral   COLONOSCOPY WITH PROPOFOL  N/A 08/14/2020   Procedure: COLONOSCOPY WITH PROPOFOL ;  Surgeon: Janalyn Keene NOVAK, MD;  Location: [facility] ENDOSCOPY;  Service: Endoscopy;  Laterality: N/A;   ENDOSCOPIC MUCOSAL RESECTION N/A 09/10/2020   Procedure: ENDOSCOPIC MUCOSAL RESECTION;  Surgeon: Wilhelmenia Aloha Raddle., MD;  Location: [facility] END[facility]COPY;  Service: Gastroenterology;  Laterality: N/A;   ESOPHAGOGASTRODUODENOSCOPY  2014   gastritis   ESOPHAGOGASTRODUODENOSCOPY (EGD) WITH PROPOFOL  N/A 04/16/2019   Procedure: ESOPHAGOGASTRODUODENOSCOPY (EGD) WITH PROPOFOL ;  Surgeon: Janalyn Keene NOVAK, MD;  Location: [facility] ENDOSCOPY;  Service: Endoscopy;  Laterality: N/A;   ESOPHAGOGASTRODUODENOSCOPY (EGD) WITH PROPOFOL  N/A 10/03/2019   Procedure: ESOPHAGOGASTRODUODENOSCOPY (EGD) WITH PROPOFOL ;  Surgeon: Janalyn Keene NOVAK, MD;  Location: [facility] ENDOSCOPY;  Service: Endoscopy;  Laterality: N/A;   EUS N/A 09/10/2020   Procedure: LOWER ENDOSCOPIC ULTRASOUND (EUS);  Surgeon: Wilhelmenia Aloha Raddle., MD;  Location: [facility] ENDOSCOPY;  Service: Gastroenterology;  Laterality: N/A;   EUS N/A 11/11/2021   Procedure: LOWER ENDOSCOPIC ULTRASOUND (EUS);  Surgeon: Wilhelmenia Aloha Raddle., MD;   Location: [facility] ENDOSCOPY;  Service: Gastroenterology;  Laterality: N/A;   FLEXIBLE SIGMOIDOSCOPY N/A 08/27/2020   Procedure: FLEXIBLE SIGMOIDOSCOPY;  Surgeon: Janalyn Keene NOVAK, MD;  Location: [facility] ENDOSCOPY;  Service: Endoscopy;  Laterality: N/A;   FLEXIBLE SIGMOIDOSCOPY N/A 09/10/2020   Procedure: FLEXIBLE SIGMOIDOSCOPY;  Surgeon: Wilhelmenia Aloha Raddle., MD;  Location: [facility] ENDOSCOPY;  Service: Gastroenterology;  Laterality: N/A;   FLEXIBLE SIGMOIDOSCOPY N/A 11/11/2021   Procedure: FLEXIBLE SIGMOIDOSCOPY;  Surgeon: Wilhelmenia Aloha Raddle., MD;  Location: [facility] ENDOSCOPY;  Service: Gastroenterology;  Laterality: N/A;   FOREIGN BODY REMOVAL  09/10/2020   Procedure: FOREIGN BODY REMOVAL ;  Surgeon: Wilhelmenia Aloha Raddle., MD;  Location: [facility] ENDOSCOPY;  Service: Gastroenterology;;   HEMOSTASIS CLIP PLACEMENT  09/10/2020   Procedure: HEMOSTASIS CLIP PLACEMENT;  Surgeon: Wilhelmenia Aloha Raddle., MD;  Location: [facility] ENDOSCOPY;  Service: Gastroenterology;;   HYSTEROSCOPY WITH D & C N/A 03/25/2021   Procedure: DILATATION AND CURETTAGE LELDON;  Surgeon: Lake Read, MD;  Location: [facility] ORS;  Service: Gynecology;  Laterality: N/A;   IR RADIOLOGIST EVAL & MGMT  01/03/2023   IR RADIOLOGIST EVAL & MGMT  01/31/2023   POLYPECTOMY  08/28/2015   Procedure: POLYPECTOMY INTESTINAL;  Surgeon: Rogelia Copping, MD;  Location: [facility];  Service: Endoscopy;;  Rectal polyp   SUBMUCOSAL LIFTING INJECTION  09/10/2020  Procedure: SUBMUCOSAL LIFTING INJECTION;  Surgeon: Wilhelmenia Aloha Raddle., MD;  Location: [facility] ENDOSCOPY;  Service: Gastroenterology;;   TUBAL LIGATION Bilateral    UMBILICAL HERNIA REPAIR N/A 08/09/2016   Procedure: HERNIA REPAIR UMBILICAL ADULT;  Surgeon: Aloysius Plant, MD;  Location: [facility] ORS;  Service: General;  Laterality: N/A;   VENTRAL HERNIA REPAIR N/A 04/10/2015   Procedure: HERNIA REPAIR VENTRAL ADULT;  Surgeon: Larinda Unknown Sharps, MD;  Location: [facility] ORS;  Service: General;  Laterality:  N/A;    Social History[2]   Medication list has been reviewed and updated.  Active Medications[3]     01/08/2024   10:15 AM 08/28/2023   10:48 AM 05/03/2023    2:08 PM 04/24/2023   11:07 AM  GAD 7 : Generalized Anxiety Score  Nervous, Anxious, on Edge 0 0 0 0  Control/stop worrying 0 0 0 0  Worry too much - different things 0 0 0 0  Trouble relaxing 0 0 0 0  Restless 0 0 0 0  Easily annoyed or irritable 0 0 0 0  Afraid - awful might happen 0 0 0 0  Total GAD 7 Score 0 0 0 0  Anxiety Difficulty Not difficult at all Not difficult at all Not difficult at all Not difficult at all       03/28/2024    2:14 PM 01/08/2024   10:15 AM 11/09/2023    3:19 PM  Depression screen PHQ 2/9  Decreased Interest 0 0 0  Down, Depressed, Hopeless 0 0 1  PHQ - 2 Score 0 0 1  Altered sleeping  0 1  Tired, decreased energy  0 1  Change in appetite  2 0  Feeling bad or failure about yourself   0 0  Trouble concentrating  0 0  Moving slowly or fidgety/restless  0 0  Suicidal thoughts  0 0  PHQ-9 Score  2  3   Difficult doing work/chores  Not difficult at all Not difficult at all     Data saved with a previous flowsheet row definition    BP Readings from Last 3 Encounters:  05/13/24 120/68  04/15/24 102/63  03/28/24 127/81    Physical Exam Vitals and nursing note reviewed.  Constitutional:      General: She is not in acute distress.    Appearance: She is well-developed.  HENT:     Head: Normocephalic and atraumatic.  Cardiovascular:     Rate and Rhythm: Normal rate and regular rhythm.  Pulmonary:     Effort: Pulmonary effort is normal. No respiratory distress.     Breath sounds: No wheezing or rhonchi.  Musculoskeletal:     Cervical back: Normal range of motion.     Right lower leg: No edema.     Left lower leg: No edema.  Lymphadenopathy:     Cervical: No cervical adenopathy.  Skin:    General: Skin is warm and dry.     Findings: No rash.  Neurological:     General: No  focal deficit present.     Mental Status: She is alert and oriented to person, place, and time.  Psychiatric:        Mood and Affect: Mood normal.        Behavior: Behavior normal.     Wt Readings from Last 3 Encounters:  05/13/24 197 lb (89.4 kg)  04/15/24 193 lb (87.5 kg)  03/28/24 196 lb (88.9 kg)    BP 120/68   Pulse (!) 109   Ht  5' 8 (1.727 m)   Wt 197 lb (89.4 kg)   SpO2 99%   BMI 29.95 kg/m   Assessment and Plan:  Problem List Items Addressed This Visit       Unprioritized   Type II diabetes mellitus with complication (HCC) - Primary (Chronic)   Currently medications are MTF.  No hypoglycemic episodes noted. Home blood sugars in the 125 range. Last visit medical regimen changes were none. Lab Results  Component Value Date   HGBA1C 6.3 (H) 01/08/2024  A1C today = 6.4.  continue the same medications.        Relevant Orders   POCT glycosylated hemoglobin (Hb A1C) (Completed)   Essential hypertension (Chronic)   Well controlled blood pressure today. Current regimen is hydrochlorothiazide , amlodipine  and irbesartan . No medication side effects noted.        Other Visit Diagnoses       Need for influenza vaccination       Relevant Orders   Flu vaccine trivalent PF, 6mos and older(Flulaval,Afluria,Fluarix,Fluzone) (Completed)     Long term current use of oral hypoglycemic drug           Return in about 4 months (around 09/11/2024) for CPX.    Leita HILARIO Adie, MD [facility] [place]           [1]  Allergies Allergen Reactions   Bactrim  [Sulfamethoxazole -Trimethoprim ] Shortness Of Breath and Nausea And Vomiting  [2]  Social History Tobacco Use   Smoking status: Every Day    Current packs/day: 0.50    Average packs/day: 0.5 packs/day for 48.0 years (24.0 ttl pk-yrs)    Types: Cigarettes    Start date: 1978    Passive exposure: Never   Smokeless tobacco: Never   Tobacco comments:    Smokes 7 ciggs  daily.    11/09/23 3-4 cigs daily/pbt  Vaping Use   Vaping status: Never Used  Substance Use Topics   Alcohol use: Yes    Alcohol/week: 2.0 standard drinks of alcohol    Types: 2 Cans of beer per week    Comment: weekends - beer    Drug use: No  [3]  Current Meds  Medication Sig   acetaminophen  (TYLENOL ) 500 MG tablet Take 1,000 mg by mouth every 6 (six) hours as needed for moderate pain.   albuterol  (VENTOLIN  HFA) 108 (90 Base) MCG/ACT inhaler INHALE 2 PUFFS INTO THE LUNGS EVERY 6 HOURS AS NEEDED FOR WHEEZING OR SHORTNESS OF BREATH   amLODipine  (NORVASC ) 10 MG tablet TAKE 1 TABLET(10 MG) BY MOUTH DAILY   aspirin  EC 81 MG tablet Take 81 mg by mouth daily.   atorvastatin  (LIPITOR) 10 MG tablet TAKE 1 TABLET(10 MG) BY MOUTH DAILY   Blood Glucose Calibration (TRUE METRIX LEVEL 1) Low SOLN 1 each by In Vitro route daily as needed.   Blood Glucose Monitoring Suppl (TRUE METRIX METER) DEVI 1 each by Does not apply route daily.   glucose blood (ACCU-CHEK GUIDE) test strip TEST TWICE DAILY   irbesartan  (AVAPRO ) 300 MG tablet TAKE 1 TABLET(300 MG) BY MOUTH DAILY   metFORMIN  (GLUCOPHAGE -XR) 500 MG 24 hr tablet TAKE 1 TABLET BY MOUTH EVERY MORNING WITH BREAKFAST   methocarbamol (ROBAXIN) 500 MG tablet Take 500 mg by mouth at bedtime.   Multiple Vitamins-Minerals (MULTIVITAMIN WITH MINERALS) tablet Take 1 tablet by mouth daily.   triamterene -hydrochlorothiazide  (MAXZIDE-25) 37.5-25 MG tablet Take 1 tablet by mouth daily.

## 2024-05-13 NOTE — Assessment & Plan Note (Signed)
 Well controlled blood pressure today. Current regimen is hydrochlorothiazide , amlodipine  and irbesartan . No medication side effects noted.

## 2024-05-27 ENCOUNTER — Telehealth: Payer: Self-pay | Admitting: *Deleted

## 2024-05-27 NOTE — Telephone Encounter (Signed)
 Patient scheduled with [facility] tomorrow 12/30 at 10am

## 2024-05-27 NOTE — Telephone Encounter (Signed)
" ° ° ° ° °  NURSING SYMPTOM TRIAGE ASSESSMENT & DISPOSITION  Mode of interaction:  Telephone Date of symptom triage interaction:  05/27/2024 Time of symptom triage interaction:  1130  Cancer diagnosis:  Malignant Neoplasm of left breast; primary neuroendocrine tumor of rectum  Symptom Triage Protocol:  Boil   Patient c/o of a boil in the R Groin. Has been present for 1 week. Boil is getting bigger in size, dark red, warm, tender to touch and now measuring about a Nickel-size.  There is a pin-head size of blood at the tip of the boil. Pt denies any redness or streaking to the surrounding areas. Denies any fever, chills, nausea, vomiting, fatigue or feeling generally unwell. She reports friction to the area of concern when walking, moving, sitting. My clothes just rub the area. Denies any history of MRSA, insect bites or other skin breakdowns.Home Care Attempts: she has been using warm compresses 3 times day and Applying Smile's PRID Drawing Salve, which has not helped. She is diabetic on Metformin . She is a currently every day smoker. She reports a similar infection this year for an abscess in her axilla, in which she saw Morna, NP 10/30. She was giving an rx for Doxycyline at that time. She finished this antibiotics and responded to it well. She followed up with Dr. Desiderio right after this on 11/17.  Patient stated that she called Dr. Almarie office about this new boil. They gave her an apt for next Monday 1/5. The surgeon's office can not see her any sooner. She has not reached out to her primary care. She stated that Dr. Almarie office advised her to reach back out to our office to see if we can see her any sooner.    Provider Consulted  Provider name and credentials: Msg sent to [facility] providers and Dr. Babara.  Provider instruction: Per Tinnie Dawn, NP- ok to sch [facility]  She can be offered an appointment in [facility] since she was seen for this previously but unlikely related to her  treatment.          Nurse Triage Priority:  Urgent (obtain medical orders as indicated with instruction for 8-24 hour or sooner follow-up)  Barriers to Care:  None identified  Nurse Triage Disposition:  In-person follow-up visit:    Patient scheduled with [facility] tomorrow 12/30 at 10am       "

## 2024-05-28 ENCOUNTER — Inpatient Hospital Stay: Attending: Nurse Practitioner | Admitting: Nurse Practitioner

## 2024-05-28 ENCOUNTER — Telehealth: Payer: Self-pay

## 2024-05-28 ENCOUNTER — Encounter: Payer: Self-pay | Admitting: Nurse Practitioner

## 2024-05-28 VITALS — BP 133/64 | HR 84 | Temp 98.0°F | Resp 16 | Wt 196.0 lb

## 2024-05-28 DIAGNOSIS — L739 Follicular disorder, unspecified: Secondary | ICD-10-CM | POA: Diagnosis not present

## 2024-05-28 DIAGNOSIS — L738 Other specified follicular disorders: Secondary | ICD-10-CM | POA: Diagnosis present

## 2024-05-28 MED ORDER — DOXYCYCLINE HYCLATE 100 MG PO TABS: 100.0000 mg | ORAL_TABLET | Freq: Two times a day (BID) | ORAL | 0 refills | Status: AC

## 2024-05-28 NOTE — Progress Notes (Signed)
 Pt has abscess to right labia, reports it did start drainage last night, and is very sore and painful.  Pt reports has had issues in that past.

## 2024-05-28 NOTE — Telephone Encounter (Signed)
 Patient advised to follow up with Alicia Copland for abscess of left labia. Called and got her schedule with [facility] for 06/13/2024 at 10:35. Informed patient of appt date and time, she verbalized understanding.

## 2024-05-28 NOTE — Progress Notes (Signed)
 " Hematology Oncology Progress Note  Chief Complaint: Donna Riley is a 63 y.o.  female with a history of stage IIA left breast cancer and rectal neuroendocrine carcinoma, osteopenia.  Presents today with concerns for a painful bump to right labia.   PERTINENT ONCOLOGY HISTORY Patient previously followed up by Dr.Corcoran, patient switched care to me on 12/31/20 Extensive medical record review was performed by me  #stage IIA left breast cancer  08/27/2014 partial mastectomy and sentinel lymph node biopsy on 08/27/2014.  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  There was lymphovascular invasion.  One of 2 sentinel lymph nodes were positive  (focus of 2.8 mm).  Tumor was > 90% ER positive, > 90% PR positive, and Her2/neu negative.  Pathologic stage was [id number].   Bone scan on 09/16/2014 revealed abnormal focal uptake at the level of right L3 pedicle and L5 vertebral body.  Lumbar spine MRI on 09/27/2014 revealed no evidence of metastatic disease with lower lumbar facet arthritis.  Abdominal and pelvic CT scan on 09/24/2014 revealed hepatomegaly and no evidence of metastatic disease.     Adjuvant chemotherapy  4 cycles of Taxotere  and Cytoxan  (09/29/2014 - 12/09/2014) with Neulasta  support. She received 50.4 Gy to the left breast from 01/12/2015 until 02/23/2015.    03/12/2015, started on letrozole  but switched to Arimidex  on 03/30/2015 secondary to diffuse joint aches.   Arimidex  completed around 03/13/2020.  BCI testing on 08/20/2019 revealed an estimated risk of late recurrence between years 5-10 of 10.1% (95% CI: 5.6-14.3%).  She is not likely to benefit from extended endocrine therapy.   She had chronic left nipple discharge.  Left breast lumpectomy at the 11 o'clock position on 04/05/2016 revealed an intraductal papilloma with sclerosis and calcifications. There was fat necrosis with fibrosis and calcifications.  Pathology was negative for atypia and malignancy  Health maintenance Colonoscopy on 08/28/2015 revealed one 4 mm polyp in the rectum (tubular adenoma). EGD on 10/03/2019 revealed erythematous mucosa in the antrum. Pathology revealed antral and oxyntic mucosa with moderate chronic inactive gastritis. H. Pylori was negative. There was no intestinal metaplasia, dysplasia, and malignancy.   #Osteoporosis Bone density study on 09/15/2014 revealed osteopenia with a T-score of -2.1 at L1-L4.  Bone density on 01/17/2018 revealed osteoporosis with a T-score of -2.6 in the AP spine L1-L4.  Bone density on 03/03/2020 revealed osteopenia with a T score of -2.2 at the lumbar spine. She is on calcium  and vitamin D.   #Lung cancer screening  She has a smoking history.  Low dose chest CT on 02/05/2020 revealed Lung-RADS 2S, benign appearance or behavior.  There was aortic atherosclerosis, in addition to left anterior descending coronary artery disease. There was mild diffuse bronchial wall thickening with mild centrilobular and paraseptal emphysema suggestive of underlying COPD.  08/09/2020 colonoscopy by Dr. Janalyn showed 5 polyp in ascending colon, descending colon and sigmoid colon resected and retrieved-pathology showed tubular adenoma, multiple fragments.  Negative for high-grade dysplasia and malignancy. Nodule in the sigmoid colon, biopsied.  Clip was placed.-Biopsy showed well-differentiated neuroendocrine tumor.  Grade 1.  08/27/2020, flexible sigmoidoscopy showed mucosal nodule in the sigmoid colon, tattooed.  Nonbleeding internal hemorrhoids.    09/10/2020  Flex Sigmoidoscopy/Lower EUS by Dr. Wilhelmenia, and EMR  Pathology showed proximal rectum EMR Well differentiated neuroendocrine tumor-carcinoid.  Neuroendocrine tumor less 10.1 cm from cauterized margin. 1.2 x 1 x 0.8 cm.  Dr. Wilhelmenia felt to be a complete  resection and recommend 1 year follow-up lower EUS. 09/30/2020 CT abdomen pelvis showed  sutures near the rectosigmoid junction consistent with history.  Hepatic steatosis.  09/30/2020 Chromogranin A level at 49.6.  09/13/2023 mammogram with ultrasound to axilla No mammographic evidence of malignancy. 11/11/2021, patient had repeat EUS/sigmoidoscopy for follow-up of the rectal NET.  Per patient, no abnormality was found.  EUS report was not available in EMR.  INTERVAL HISTORY Donna Riley is a 63 y.o. female who has above history reviewed by me today presents for follow up for history of breast cancer as well as rectal neuroendocrine carcinoma.  She called requesting an appointment due to concerns of a painful bump that started draining last night to her right labia.  External vaginal exam completed with Amy CMA in room to assist.  She has a small raised red area on the outside of her right labia.  Appears to be more like folliculitis.  No open areas seen, no drainage seen during examination.  No abscess or induration present.  Tender to touch, warm, slightly red.  No need for I&D at this time.  Patient denies any fever/chills.  Will treat with antibiotics and have her follow up with her OBGYN Bernarda Perfect.  Amy was able to get her an appointment with Bernarda Perfect Jan 26th.  Instructed patient to call back with any concerns of fever/chills, worsening pain or drainage.    No new complaints or new breast concerns.  Denies cough, shortness of breath, abdominal pain, blood in the stool.      Past Medical History:  Diagnosis Date   Abdominal bloating    Acute midline low back pain without sciatica 10/09/2018   Aortic atherosclerosis    Arthritis    Breast cancer, left (HCC) 08/04/2014   a.) Bx (+) for invasive mammary carcinoma with DCIS; ER/PR (+), HER2/neu (-). (+) lymphovascular invasion with 1 of 2 lymph nodes being (+). Treated with lumpectomy + adjuvant chemoradiation.   Breast wound, left, sequela 06/23/2016   Cancer of gastrointestinal tract (HCC) 08/2020   COPD  (chronic obstructive pulmonary disease) (HCC)    Coronary artery disease    a.) MV 11/23/2020: no ischemia   Diastolic dysfunction 10/29/2020   a.) TTE 10/29/2020: EF >55%, no RMWAs, norm RVSF, triv AR/TR/PR, mild MR, G1DD   DOE (dyspnea on exertion)    Dyspnea    Elevated LFTs    Endometrial polyp 02/03/2021   a.) Pelvic US  --> 18 x 5 x 10 mm lenticular nodule in endrometrial canal; favored endometrial polyp vs. neoplasm. b.) Bx 02/23/2021 --> no dysplasia or malignancy; benign.   Family history of breast cancer    Family history of kidney cancer    Family history of pancreatic cancer    Family history of throat cancer    Fatigue 08/01/2016   GERD (gastroesophageal reflux disease)    occ-no meds   Gout    Helicobacter pylori gastritis 06/03/2019   Hydradenitis 01/23/2015   Hyperlipidemia    Hypertension    Left renal mass 12/06/2022   a.) CT AP 12/06/2022 --> 2.4 cm enhancing lesion of the lower pole of the LEFT kidney   Mastitis 10/15/2016   Neuroendocrine tumor (HCC) 08/14/2020   a.) Rectal mass Bx (+) for well differentiated NET (grade I). IHC for cytokeratin AE1/AE3 and CD56 supports.   Osteoporosis of lumbar spine 03/12/2015   2021 - Prolia recommended by Oncology - to start once dental issues are resolved   Polyp of ascending colon  Polyp of descending colon    Polyp of sigmoid colon    Positive PPD, treated    PVD (peripheral vascular disease) with claudication    Rectal polyp    T2DM (type 2 diabetes mellitus) (HCC) 06/2014    Past Surgical History:  Procedure Laterality Date   BIOPSY  11/11/2021   Procedure: BIOPSY;  Surgeon: Wilhelmenia Aloha Raddle., MD;  Location: [facility] ENDOSCOPY;  Service: Gastroenterology;;   BREAST BIOPSY Left 07/31/2014   IMC and DCIS    BREAST BIOPSY Left 03/08/2016   INTRADUCTAL PAPILLOMA    BREAST LUMPECTOMY Left 07/2014   IDC, DCIS, clear margins, positive LN   BREAST LUMPECTOMY Left 04/05/2016   excision of intraductal papilloma, no  malignancy or atypia   BREAST LUMPECTOMY WITH NEEDLE LOCALIZATION Left 04/05/2016   Procedure: BREAST LUMPECTOMY WITH NEEDLE LOCALIZATION;  Surgeon: Dorothyann LITTIE Husk, MD;  Location: [facility] ORS;  Service: General;  Laterality: Left;   BREAST LUMPECTOMY WITH NEEDLE LOCALIZATION AND AXILLARY SENTINEL LYMPH NODE BX Left 08/27/14   CHOLECYSTECTOMY  09/07/2013   COLONOSCOPY WITH PROPOFOL  N/A 08/28/2015   Procedure: COLONOSCOPY WITH PROPOFOL ;  Surgeon: Rogelia Copping, MD;  Location: [facility];  Service: Endoscopy;  Laterality: N/A;  Diabetic oral   COLONOSCOPY WITH PROPOFOL  N/A 08/14/2020   Procedure: COLONOSCOPY WITH PROPOFOL ;  Surgeon: Janalyn Keene NOVAK, MD;  Location: [facility] ENDOSCOPY;  Service: Endoscopy;  Laterality: N/A;   ENDOSCOPIC MUCOSAL RESECTION N/A 09/10/2020   Procedure: ENDOSCOPIC MUCOSAL RESECTION;  Surgeon: Wilhelmenia Aloha Raddle., MD;  Location: [facility] ENDOSCOPY;  Service: Gastroenterology;  Laterality: N/A;   ESOPHAGOGASTRODUODENOSCOPY  2014   gastritis   ESOPHAGOGASTRODUODENOSCOPY (EGD) WITH PROPOFOL  N/A 04/16/2019   Procedure: ESOPHAGOGASTRODUODENOSCOPY (EGD) WITH PROPOFOL ;  Surgeon: Janalyn Keene NOVAK, MD;  Location: [facility] ENDOSCOPY;  Service: Endoscopy;  Laterality: N/A;   ESOPHAGOGASTRODUODENOSCOPY (EGD) WITH PROPOFOL  N/A 10/03/2019   Procedure: ESOPHAGOGASTRODUODENOSCOPY (EGD) WITH PROPOFOL ;  Surgeon: Janalyn Keene NOVAK, MD;  Location: [facility] ENDOSCOPY;  Service: Endoscopy;  Laterality: N/A;   EUS N/A 09/10/2020   Procedure: LOWER ENDOSCOPIC ULTRASOUND (EUS);  Surgeon: Wilhelmenia Aloha Raddle., MD;  Location[facility];  Service: Gastroenterology;  Laterality: N/A;   EUS N/A 11/11/2021   Procedure: LOWER ENDOSCOPIC ULTRASOUND (EUS);  Surgeon: Wilhelmenia Aloha Raddle., MD;  Location: [facility] ENDOSCOPY;  Service: Gastroenterology;  Laterality: N/A;   FLEXIBLE SIGMOIDOSCOPY N/A 08/27/2020   Procedure: FLEXIBLE SIGMOIDOSCOPY;  Surgeon: Janalyn Keene NOVAK, MD;  Location: [facility] ENDOSCOPY;   Service: Endoscopy;  Laterality: N/A;   FLEXIBLE SIGMOIDOSCOPY N/A 09/10/2020   Procedure: FLEXIBLE SIGMOIDOSCOPY;  Surgeon: Wilhelmenia Aloha Raddle., MD;  Location: [facility] ENDOSCOPY;  Service: Gastroenterology;  Laterality: N/A;   FLEXIBLE SIGMOIDOSCOPY N/A 11/11/2021   Procedure: FLEXIBLE SIGMOIDOSCOPY;  Surgeon: Wilhelmenia Aloha Raddle., MD;  Location: [facility] ENDOSCOPY;  Service: Gastroenterology;  Laterality: N/A;   FOREIGN BODY REMOVAL  09/10/2020   Procedure: FOREIGN BODY REMOVAL ;  Surgeon: Wilhelmenia Aloha Raddle., MD;  Location: [facility] ENDOSCOPY;  Service: Gastroenterology;;   HEMOSTASIS CLIP PLACEMENT  09/10/2020   Procedure: HEMOSTASIS CLIP PLACEMENT;  Surgeon: Wilhelmenia Aloha Raddle., MD;  Location: [facility] ENDOSCOPY;  Service: Gastroenterology;;   HYSTEROSCOPY WITH D & C N/A 03/25/2021   Procedure: DILATATION AND CURETTAGE LELDON;  Surgeon: Lake Read, MD;  Location: [facility] ORS;  Service: Gynecology;  Laterality: N/A;   IR RADIOLOGIST EVAL & MGMT  01/03/2023   IR RADIOLOGIST EVAL & MGMT  01/31/2023   POLYPECTOMY  08/28/2015   Procedure: POLYPECTOMY INTESTINAL;  Surgeon: Rogelia Copping, MD;  Location: [facility];  Service: Endoscopy;;  Rectal polyp   SUBMUCOSAL LIFTING INJECTION  09/10/2020   Procedure: SUBMUCOSAL LIFTING INJECTION;  Surgeon: Wilhelmenia Aloha Raddle., MD;  Location: [facility] ENDOSCOPY;  Service: Gastroenterology;;   TUBAL LIGATION Bilateral    UMBILICAL HERNIA REPAIR N/A 08/09/2016   Procedure: HERNIA REPAIR UMBILICAL ADULT;  Surgeon: Aloysius Plant, MD;  Location: [facility] ORS;  Service: General;  Laterality: N/A;   VENTRAL HERNIA REPAIR N/A 04/10/2015   Procedure: HERNIA REPAIR VENTRAL ADULT;  Surgeon: Larinda Unknown Sharps, MD;  Location: [facility] ORS;  Service: General;  Laterality: N/A;    Family History  Problem Relation Age of Onset   Cancer Mother 110       Pancreatic   Cancer Father        Throat dx 21s   Healthy Sister    Cancer Sister 48       unk metastatic   Kidney cancer  Sister 31   HIV Brother    Breast cancer Maternal Grandmother 38   Breast cancer Cousin        d. 75s    Social History:  reports that she has been smoking cigarettes. She started smoking about 48 years ago. She has a 24 pack-year smoking history. She has never been exposed to tobacco smoke. She has never used smokeless tobacco. She reports current alcohol use of about 2.0 standard drinks of alcohol per week. She reports that she does not use drugs.   Allergies:  Allergies  Allergen Reactions   Bactrim  [Sulfamethoxazole -Trimethoprim ] Shortness Of Breath and Nausea And Vomiting    Current Medications: Current Outpatient Medications  Medication Sig Dispense Refill   acetaminophen  (TYLENOL ) 500 MG tablet Take 1,000 mg by mouth every 6 (six) hours as needed for moderate pain.     albuterol  (VENTOLIN  HFA) 108 (90 Base) MCG/ACT inhaler INHALE 2 PUFFS INTO THE LUNGS EVERY 6 HOURS AS NEEDED FOR WHEEZING OR SHORTNESS OF BREATH 6.7 g 0   amLODipine  (NORVASC ) 10 MG tablet TAKE 1 TABLET(10 MG) BY MOUTH DAILY 90 tablet 0   aspirin  EC 81 MG tablet Take 81 mg by mouth daily.     atorvastatin  (LIPITOR) 10 MG tablet TAKE 1 TABLET(10 MG) BY MOUTH DAILY 90 tablet 1   Blood Glucose Calibration (TRUE METRIX LEVEL 1) Low SOLN 1 each by In Vitro route daily as needed. 1 each 3   Blood Glucose Monitoring Suppl (TRUE METRIX METER) DEVI 1 each by Does not apply route daily. 100 Device 3   doxycycline  (VIBRA -TABS) 100 MG tablet Take 1 tablet (100 mg total) by mouth 2 (two) times daily for 7 days. 14 tablet 0   glucose blood (ACCU-CHEK GUIDE) test strip TEST TWICE DAILY 100 strip 12   irbesartan  (AVAPRO ) 300 MG tablet TAKE 1 TABLET(300 MG) BY MOUTH DAILY 90 tablet 0   metFORMIN  (GLUCOPHAGE -XR) 500 MG 24 hr tablet TAKE 1 TABLET BY MOUTH EVERY MORNING WITH BREAKFAST 90 tablet 1   methocarbamol (ROBAXIN) 500 MG tablet Take 500 mg by mouth at bedtime.     Multiple Vitamins-Minerals (MULTIVITAMIN WITH MINERALS) tablet  Take 1 tablet by mouth daily.     triamterene -hydrochlorothiazide  (MAXZIDE-25) 37.5-25 MG tablet Take 1 tablet by mouth daily. 90 tablet 1   No current facility-administered medications for this visit.   Review of Systems  Constitutional:  Negative for appetite change, chills, fatigue and fever.  HENT:   Negative for hearing loss and voice change.   Eyes:  Negative for eye problems.  Respiratory:  Negative for  chest tightness and cough.   Cardiovascular:  Negative for chest pain.  Gastrointestinal:  Negative for abdominal distention, abdominal pain and blood in stool.  Endocrine: Negative for hot flashes.  Genitourinary:  Negative for difficulty urinating and frequency.   Musculoskeletal:  Negative for arthralgias.  Skin:  Negative for itching and rash.       Painful draining lesion to right labia  Neurological:  Negative for extremity weakness.  Hematological:  Negative for adenopathy.  Psychiatric/Behavioral:  Negative for confusion.       Vitals Blood pressure 133/64, pulse 84, temperature 98 F (36.7 C), temperature source Tympanic, resp. rate 16, weight 196 lb (88.9 kg), SpO2 100%.  Performance status (ECOG): 1 Physical Exam Vitals and nursing note reviewed.  Constitutional:      General: She is not in acute distress.    Appearance: She is well-developed. She is not diaphoretic.  Eyes:     General: No scleral icterus.    Conjunctiva/sclera: Conjunctivae normal.  Cardiovascular:     Rate and Rhythm: Normal rate and regular rhythm.  Pulmonary:     Effort: Pulmonary effort is normal.     Breath sounds: Normal breath sounds.  Abdominal:     General: Abdomen is flat.  Skin:    General: Skin is warm and dry.     Comments: Walnut sized abscess, not draining, painful to touch, swollen, red left axilla   Neurological:     Mental Status: She is alert and oriented to person, place, and time.  Psychiatric:        Mood and Affect: Mood normal.   Declined breast exam.    RADIOGRAPHIC STUDIES: I have personally reviewed the radiological images as listed and agreed with the findings in the report. No results found.    Labs are reviewed and discussed with patient.    Latest Ref Rng & Units 09/13/2023    9:56 AM 08/28/2023   11:34 AM 01/11/2023   10:24 AM  CBC  WBC 4.0 - 10.5 K/uL 8.4  9.1  7.5   Hemoglobin 12.0 - 15.0 g/dL 86.3  86.8  86.6   Hematocrit 36.0 - 46.0 % 41.9  40.0  42.8   Platelets 150 - 400 K/uL 246  257  247       Latest Ref Rng & Units 01/08/2024   10:45 AM 09/13/2023    9:56 AM 08/28/2023   11:34 AM  CMP  Glucose 70 - 99 mg/dL 876  866  867   BUN 8 - 23 mg/dL 16  22  19    Creatinine 0.44 - 1.00 mg/dL 9.15  8.79  8.99   Sodium 135 - 145 mmol/L 137  134  139   Potassium 3.5 - 5.1 mmol/L 3.5  3.4  3.3   Chloride 98 - 111 mmol/L 100  102  101   CO2 22 - 32 mmol/L 25  20  27    Calcium  8.9 - 10.3 mg/dL 9.4  9.1  9.7   Total Protein 6.5 - 8.1 g/dL  7.8  8.0   Total Bilirubin 0.0 - 1.2 mg/dL  0.8  0.6   Alkaline Phos 38 - 126 U/L  89  93   AST 15 - 41 U/L  20  19   ALT 0 - 44 U/L  20  21     ASSESSMENT & PLAN:   Folliculitis: Start doxycycline  for 7 days sent to pharmacy today.  Patient reports that she has started getting painful bumps in vaginal  area.  Appointment made for her to follow up with her OBGYN MD Dr. Ubaldo for 06/24/24.     No problem-specific Assessment & Plan notes found for this encounter.    No orders of the defined types were placed in this encounter.  Doxycycline  sent to Pharmacy of choice F/U with Bernarda Ubaldo 06/24/24  Continue current f/u plan    Morna Husband [facility] Hematology Oncology 05/28/2024  "

## 2024-06-03 ENCOUNTER — Ambulatory Visit: Admitting: Surgery

## 2024-06-11 NOTE — Progress Notes (Unsigned)
 "   Justus Leita DEL, MD   No chief complaint on file.   HPI:      [name] is a 64 y.o. No obstetric history on file. whose LMP was No LMP recorded. 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)    History of rectal polyps    Nodule of colon    Sebaceous cyst 08/11/2020   Osteopenia 07/19/2020   Breast pain, right 07/03/2020   Atherosclerosis of abdominal aorta 02/06/2020   Tobacco use disorder, continuous 01/14/2020   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   Lumbosacral spondylosis without myelopathy 11/22/2018   Vertigo 10/09/2018   Hyperlipidemia associated with type 2 diabetes mellitus (HCC) 04/11/2018   Gastroparesis 04/11/2018   Long term (current) use of aromatase inhibitors 12/25/2017   Arthritis    Elevated LFTs    Essential hypertension    Positive PPD, treated    Irritable bowel syndrome with both constipation and diarrhea 07/20/2015   Type II diabetes mellitus with complication (HCC) 06/22/2015   Pre-ulcerative calluses 06/22/2015   Neuropathy 06/22/2015   Hot flash, menopausal 04/09/2015   Atherosclerotic peripheral vascular disease with intermittent claudication 03/25/2015   Malignant neoplasm of left breast in female, estrogen receptor positive (HCC) 05/30/2014   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    Past Surgical History:  Procedure Laterality Date   BIOPSY  11/11/2021   Procedure: BIOPSY;  Surgeon: Wilhelmenia Aloha Raddle., MD;  Location: [facility] ENDOSCOPY;  Service: Gastroenterology;;   BREAST BIOPSY Left 07/31/2014   IMC and DCIS    BREAST BIOPSY Left 03/08/2016   INTRADUCTAL PAPILLOMA    BREAST LUMPECTOMY Left 07/2014   IDC, DCIS, clear margins, positive LN   BREAST LUMPECTOMY Left 04/05/2016   excision of intraductal papilloma, no malignancy or atypia   BREAST LUMPECTOMY WITH NEEDLE LOCALIZATION Left 04/05/2016   Procedure: BREAST LUMPECTOMY WITH NEEDLE LOCALIZATION;  Surgeon: Dorothyann LITTIE Husk, MD;  Location: [facility] ORS;  Service: General;  Laterality: Left;   BREAST LUMPECTOMY WITH NEEDLE LOCALIZATION AND AXILLARY SENTINEL LYMPH NODE BX Left 08/27/14   CHOLECYSTECTOMY  09/07/2013   COLONOSCOPY WITH PROPOFOL  N/A 08/28/2015   Procedure: COLONOSCOPY WITH PROPOFOL ;  Surgeon: Rogelia Copping, MD;  Location: [facility]ERY [facility];  Service: Endoscopy;  Laterality: N/A;  Diabetic oral   COLONOSCOPY WITH PROPOFOL  N/A 08/14/2020   Procedure: COLONOSCOPY WITH PROPOFOL ;  Surgeon: Janalyn Keene NOVAK, MD;  Location: [facility] ENDOSCOPY;  Service: Endoscopy;  Laterality: N/A;   ENDOSCOPIC MUCOSAL RESECTION N/A 09/10/2020   Procedure: ENDOSCOPIC MUCOSAL RESECTION;  Surgeon: Wilhelmenia Aloha Raddle., MD;  Location: [facility]COPY;  Service: Gastroenterology;  Laterality: N/A;   ESOPHAGOGASTRODUODENOSCOPY  2014   gastritis   ESOPHAGOGASTRODUODENOSCOPY (EGD) WITH PROPOFOL  N/A 04/16/2019   Procedure: ESOPHAGOGASTRODUODENOSCOPY (EGD) WITH PROPOFOL ;  Surgeon: Janalyn Keene NOVAK, MD;  Location: [facility] ENDOSCOPY;  Service: Endoscopy;  Laterality: N/A;   ESOPHAGOGASTRODUODENOSCOPY (EGD) WITH PROPOFOL  N/A 10/03/2019   Procedure: ESOPHAGOGASTRODUODENOSCOPY (EGD) WITH PROPOFOL ;  Surgeon: Janalyn Keene NOVAK,  MD;  Location: [facility] ENDOSCOPY;  Service: Endoscopy;  Laterality: N/A;   EUS N/A 09/10/2020   Procedure: LOWER ENDOSCOPIC ULTRASOUND (EUS);  Surgeon: Wilhelmenia Aloha Raddle., MD;  Location: [facility] ENDOSCOPY;  Service: Gastroenterology;   Laterality: N/A;   EUS N/A 11/11/2021   Procedure: LOWER ENDOSCOPIC ULTRASOUND (EUS);  Surgeon: Wilhelmenia Aloha Raddle., MD;  Location: [facility] ENDOSCOPY;  Service: Gastroenterology;  Laterality: N/A;   FLEXIBLE SIGMOIDOSCOPY N/A 08/27/2020   Procedure: FLEXIBLE SIGMOIDOSCOPY;  Surgeon: Janalyn Keene NOVAK, MD;  Location: [facility] ENDOSCOPY;  Service: Endoscopy;  Laterality: N/A;   FLEXIBLE SIGMOIDOSCOPY N/A 09/10/2020   Procedure: FLEXIBLE SIGMOIDOSCOPY;  Surgeon: Wilhelmenia Aloha Raddle., MD;  Location: [facility] ENDOSCOPY;  Service: Gastroenterology;  Laterality: N/A;   FLEXIBLE SIGMOIDOSCOPY N/A 11/11/2021   Procedure: FLEXIBLE SIGMOIDOSCOPY;  Surgeon: Wilhelmenia Aloha Raddle., MD;  Location: [facility] ENDOSCOPY;  Service: Gastroenterology;  Laterality: N/A;   FOREIGN BODY REMOVAL  09/10/2020   Procedure: FOREIGN BODY REMOVAL ;  Surgeon: Wilhelmenia Aloha Raddle., MD;  Location: [facility] ENDOSCOPY;  Service: Gastroenterology;;   HEMOSTASIS CLIP PLACEMENT  09/10/2020   Procedure: HEMOSTASIS CLIP PLACEMENT;  Surgeon: Wilhelmenia Aloha Raddle., MD;  Location: [facility] ENDOSCOPY;  Service: Gastroenterology;;   HYSTEROSCOPY WITH D & C N/A 03/25/2021   Procedure: DILATATION AND CURETTAGE LELDON;  Surgeon: Lake Read, MD;  Location: [facility] ORS;  Service: Gynecology;  Laterality: N/A;   IR RADIOLOGIST EVAL & MGMT  01/03/2023   IR RADIOLOGIST EVAL & MGMT  01/31/2023   POLYPECTOMY  08/28/2015   Procedure: POLYPECTOMY INTESTINAL;  Surgeon: Rogelia Copping, MD;  Location: [facility] SURGERY [facility]TR;  Service: Endoscopy;;  Rectal polyp   SUBMUCOSAL LIFTING INJECTION  09/10/2020   Procedure: SUBMUCOSAL LIFTING INJECTION;  Surgeon: Wilhelmenia Aloha Raddle., MD;  Location: [facility] ENDOSCOPY;  Service: Gastroenterology;;   TUBAL LIGATION Bilateral    UMBILICAL HERNIA REPAIR N/A 08/09/2016   Procedure: HERNIA REPAIR UMBILICAL ADULT;  Surgeon: Aloysius Plant, MD;  Location: [facility] ORS;  Service: General;  Laterality: N/A;   VENTRAL HERNIA REPAIR N/A 04/10/2015    Procedure: HERNIA REPAIR VENTRAL ADULT;  Surgeon: Larinda Unknown Sharps, MD;  Location: [facility] ORS;  Service: General;  Laterality: N/A;    Family History  Problem Relation Age of Onset   Cancer Mother 22       Pancreatic   Cancer Father        Throat dx 13s   Healthy Sister    Cancer Sister 71       unk metastatic   Kidney cancer Sister 12   HIV Brother    Breast cancer Maternal Grandmother 2   Breast cancer Cousin        d. 88s    Social History   Socioeconomic History   Marital status: Widowed    Spouse name: Not on file   Number of children: 4   Years of education: Not on file   Highest education level: 10th grade  Occupational History   Occupation: Disabled  Tobacco Use   Smoking status: Every Day    Current packs/day: 0.50    Average packs/day: 0.5 packs/day for 48.0 years (24.0 ttl pk-yrs)    Types: Cigarettes    Start date: 84    Passive exposure: Never   Smokeless tobacco: Never   Tobacco comments:    Smokes 7 ciggs daily.    11/09/23 3-4 cigs daily/pbt  Vaping Use   Vaping status: Never Used  Substance and Sexual Activity   Alcohol use: Yes    Alcohol/week: 2.0 standard drinks  of alcohol    Types: 2 Cans of beer per week    Comment: weekends - beer    Drug use: No   Sexual activity: Not Currently  Other Topics Concern   Not on file  Social History Narrative   Pt lives alone, son lives 2 doors down.   Social Drivers of Health   Tobacco Use: High Risk (05/28/2024)   Patient History    Smoking Tobacco Use: Every Day    Smokeless Tobacco Use: Never    Passive Exposure: Never  Financial Resource Strain: Low Risk (11/09/2023)   Overall Financial Resource Strain (CARDIA)    Difficulty of Paying Living Expenses: Not hard at all  Food Insecurity: No Food Insecurity (11/09/2023)   Epic    Worried About Programme Researcher, Broadcasting/film/video in the Last Year: Never true    Ran Out of Food in the Last Year: Never true  Transportation Needs: No Transportation Needs  (11/09/2023)   Epic    Lack of Transportation (Medical): No    Lack of Transportation (Non-Medical): No  Physical Activity: Inactive (11/09/2023)   Exercise Vital Sign    Days of Exercise per Week: 0 days    Minutes of Exercise per Session: 0 min  Stress: No Stress Concern Present (11/09/2023)   [employer] of Occupational Health - Occupational Stress Questionnaire    Feeling of Stress: Not at all  Social Connections: Socially Isolated (11/09/2023)   Social Connection and Isolation Panel    Frequency of Communication with Friends and Family: More than three times a week    Frequency of Social Gatherings with Friends and Family: Three times a week    Attends Religious Services: Never    Active Member of Clubs or Organizations: No    Attends Banker Meetings: Never    Marital Status: Widowed  Intimate Partner Violence: Not At Risk (11/09/2023)   Epic    Fear of Current or Ex-Partner: No    Emotionally Abused: No    Physically Abused: No    Sexually Abused: No  Depression (PHQ2-9): Low Risk (05/28/2024)   Depression (PHQ2-9)    PHQ-2 Score: 0  Alcohol Screen: Low Risk (11/09/2023)   Alcohol Screen    Last Alcohol Screening Score (AUDIT): 2  Housing: Unknown (04/30/2024)   Received from [facility]   Epic    Unable to Pay for Housing in the Last Year: Not on file    Number of Times Moved in the Last Year: Not on file    At any time in the past 12 months, were you homeless or living in a shelter (including now)?: No  Utilities: Not At Risk (11/09/2023)   Epic    Threatened with loss of utilities: No  Health Literacy: Adequate Health Literacy (11/09/2023)   B1300 Health Literacy    Frequency of need for help with medical instructions: Never    Outpatient Medications Prior to Visit  Medication Sig Dispense Refill   acetaminophen  (TYLENOL ) 500 MG tablet Take 1,000 mg by mouth every 6 (six) hours as needed for moderate pain.     albuterol  (VENTOLIN   HFA) 108 (90 Base) MCG/ACT inhaler INHALE 2 PUFFS INTO THE LUNGS EVERY 6 HOURS AS NEEDED FOR WHEEZING OR SHORTNESS OF BREATH 6.7 g 0   amLODipine  (NORVASC ) 10 MG tablet TAKE 1 TABLET(10 MG) BY MOUTH DAILY 90 tablet 0   aspirin  EC 81 MG tablet Take 81 mg by mouth daily.     atorvastatin  (LIPITOR)  10 MG tablet TAKE 1 TABLET(10 MG) BY MOUTH DAILY 90 tablet 1   Blood Glucose Calibration (TRUE METRIX LEVEL 1) Low SOLN 1 each by In Vitro route daily as needed. 1 each 3   Blood Glucose Monitoring Suppl (TRUE METRIX METER) DEVI 1 each by Does not apply route daily. 100 Device 3   glucose blood (ACCU-CHEK GUIDE) test strip TEST TWICE DAILY 100 strip 12   irbesartan  (AVAPRO ) 300 MG tablet TAKE 1 TABLET(300 MG) BY MOUTH DAILY 90 tablet 0   metFORMIN  (GLUCOPHAGE -XR) 500 MG 24 hr tablet TAKE 1 TABLET BY MOUTH EVERY MORNING WITH BREAKFAST 90 tablet 1   methocarbamol (ROBAXIN) 500 MG tablet Take 500 mg by mouth at bedtime.     Multiple Vitamins-Minerals (MULTIVITAMIN WITH MINERALS) tablet Take 1 tablet by mouth daily.     triamterene -hydrochlorothiazide  (MAXZIDE-25) 37.5-25 MG tablet Take 1 tablet by mouth daily. 90 tablet 1   No facility-administered medications prior to visit.      ROS:  Review of Systems BREAST: No symptoms   OBJECTIVE:   Vitals:  There were no vitals taken for this visit.  Physical Exam  Results: No results found for this or any previous visit (from the past 24 hours).   Assessment/Plan: No diagnosis found.    No orders of the defined types were placed in this encounter.     No follow-ups on file.  Patti Shorb B. Taniaya Rudder, PA-C 06/11/2024 11:27 AM      "

## 2024-06-13 ENCOUNTER — Encounter: Payer: Self-pay | Admitting: Obstetrics and Gynecology

## 2024-06-13 ENCOUNTER — Ambulatory Visit: Admitting: Obstetrics and Gynecology

## 2024-06-13 VITALS — BP 119/68 | HR 93 | Ht 68.0 in | Wt 196.0 lb

## 2024-06-13 DIAGNOSIS — N764 Abscess of vulva: Secondary | ICD-10-CM

## 2024-06-13 DIAGNOSIS — L738 Other specified follicular disorders: Secondary | ICD-10-CM

## 2024-06-13 DIAGNOSIS — R1032 Left lower quadrant pain: Secondary | ICD-10-CM

## 2024-06-13 NOTE — Patient Instructions (Signed)
 I value your feedback and you entrusting [place] with your care. If you get a [place] patient survey, I would appreciate you taking the time to let us know about your experience today. Thank you! ? ? ?

## 2024-07-02 ENCOUNTER — Other Ambulatory Visit: Payer: Self-pay

## 2024-07-02 DIAGNOSIS — E1169 Type 2 diabetes mellitus with other specified complication: Secondary | ICD-10-CM

## 2024-07-02 MED ORDER — ATORVASTATIN CALCIUM 10 MG PO TABS: 10.0000 mg | ORAL_TABLET | Freq: Every day | ORAL | 0 refills | Status: AC

## 2024-08-28 ENCOUNTER — Encounter: Admitting: Student

## 2024-09-19 ENCOUNTER — Other Ambulatory Visit

## 2024-09-24 ENCOUNTER — Ambulatory Visit: Admitting: Oncology

## 2024-11-21 ENCOUNTER — Ambulatory Visit

## 2025-02-06 ENCOUNTER — Ambulatory Visit: Admitting: Urology
